# Patient Record
Sex: Male | Born: 1935 | Race: White | Hispanic: Yes | Smoking: Former smoker
Health system: Southern US, Community
[De-identification: ages and names within clinical notes are randomized; demographics above are authoritative.]

## PROBLEM LIST (undated history)

## (undated) DIAGNOSIS — I341 Nonrheumatic mitral (valve) prolapse: Secondary | ICD-10-CM

## (undated) DIAGNOSIS — I071 Rheumatic tricuspid insufficiency: Secondary | ICD-10-CM

## (undated) DIAGNOSIS — M7120 Synovial cyst of popliteal space [Baker], unspecified knee: Secondary | ICD-10-CM

## (undated) DIAGNOSIS — IMO0001 Reserved for inherently not codable concepts without codable children: Secondary | ICD-10-CM

## (undated) DIAGNOSIS — I4891 Unspecified atrial fibrillation: Secondary | ICD-10-CM

## (undated) DIAGNOSIS — L309 Dermatitis, unspecified: Secondary | ICD-10-CM

## (undated) DIAGNOSIS — I350 Nonrheumatic aortic (valve) stenosis: Secondary | ICD-10-CM

## (undated) DIAGNOSIS — M1990 Unspecified osteoarthritis, unspecified site: Secondary | ICD-10-CM

## (undated) DIAGNOSIS — N529 Male erectile dysfunction, unspecified: Secondary | ICD-10-CM

## (undated) DIAGNOSIS — I340 Nonrheumatic mitral (valve) insufficiency: Secondary | ICD-10-CM

## (undated) DIAGNOSIS — I499 Cardiac arrhythmia, unspecified: Secondary | ICD-10-CM

## (undated) DIAGNOSIS — Z8719 Personal history of other diseases of the digestive system: Secondary | ICD-10-CM

## (undated) DIAGNOSIS — Z923 Personal history of irradiation: Secondary | ICD-10-CM

## (undated) HISTORY — DX: Nonrheumatic aortic (valve) stenosis: I35.0

## (undated) HISTORY — DX: Synovial cyst of popliteal space (Baker), unspecified knee: M71.20

## (undated) HISTORY — DX: Nonrheumatic mitral (valve) prolapse: I34.1

## (undated) HISTORY — PX: AORTIC VALVE REPLACEMENT: SHX41

## (undated) HISTORY — DX: Unspecified atrial fibrillation: I48.91

## (undated) HISTORY — DX: Male erectile dysfunction, unspecified: N52.9

## (undated) HISTORY — DX: Essential (primary) hypertension: I10

## (undated) HISTORY — PX: ROTATOR CUFF REPAIR: SHX139

## (undated) HISTORY — DX: Unspecified osteoarthritis, unspecified site: M19.90

## (undated) HISTORY — PX: HERNIA REPAIR: SHX51

---

## 1992-08-28 HISTORY — PX: OTHER SURGICAL HISTORY: SHX169

## 2001-08-07 ENCOUNTER — Encounter: Admission: RE | Admit: 2001-08-07 | Discharge: 2001-08-07 | Payer: Self-pay | Admitting: Orthopedic Surgery

## 2001-08-07 ENCOUNTER — Encounter: Payer: Self-pay | Admitting: Orthopedic Surgery

## 2002-06-13 ENCOUNTER — Encounter: Admission: RE | Admit: 2002-06-13 | Discharge: 2002-06-13 | Payer: Self-pay | Admitting: Family Medicine

## 2002-06-13 ENCOUNTER — Encounter: Payer: Self-pay | Admitting: Family Medicine

## 2003-09-23 ENCOUNTER — Encounter: Payer: Self-pay | Admitting: Gastroenterology

## 2004-08-28 DIAGNOSIS — Z8679 Personal history of other diseases of the circulatory system: Secondary | ICD-10-CM

## 2004-08-28 HISTORY — DX: Personal history of other diseases of the circulatory system: Z86.79

## 2004-10-17 ENCOUNTER — Ambulatory Visit: Payer: Self-pay | Admitting: Family Medicine

## 2004-10-17 ENCOUNTER — Encounter: Admission: RE | Admit: 2004-10-17 | Discharge: 2004-10-17 | Payer: Self-pay | Admitting: Family Medicine

## 2004-11-29 ENCOUNTER — Ambulatory Visit: Payer: Self-pay | Admitting: Family Medicine

## 2004-12-06 ENCOUNTER — Ambulatory Visit: Payer: Self-pay | Admitting: Family Medicine

## 2004-12-16 ENCOUNTER — Ambulatory Visit: Payer: Self-pay

## 2005-01-24 ENCOUNTER — Ambulatory Visit: Payer: Self-pay | Admitting: Cardiology

## 2005-01-26 ENCOUNTER — Ambulatory Visit: Payer: Self-pay | Admitting: Internal Medicine

## 2005-01-30 ENCOUNTER — Ambulatory Visit: Payer: Self-pay

## 2005-03-07 ENCOUNTER — Ambulatory Visit: Payer: Self-pay

## 2005-03-07 ENCOUNTER — Ambulatory Visit (HOSPITAL_COMMUNITY): Admission: RE | Admit: 2005-03-07 | Discharge: 2005-03-07 | Payer: Self-pay | Admitting: Cardiology

## 2005-03-10 ENCOUNTER — Inpatient Hospital Stay (HOSPITAL_BASED_OUTPATIENT_CLINIC_OR_DEPARTMENT_OTHER): Admission: RE | Admit: 2005-03-10 | Discharge: 2005-03-10 | Payer: Self-pay | Admitting: Cardiology

## 2005-03-10 ENCOUNTER — Ambulatory Visit: Payer: Self-pay | Admitting: Cardiology

## 2005-03-17 ENCOUNTER — Ambulatory Visit (HOSPITAL_COMMUNITY)
Admission: RE | Admit: 2005-03-17 | Discharge: 2005-03-17 | Payer: Self-pay | Admitting: Thoracic Surgery (Cardiothoracic Vascular Surgery)

## 2005-03-21 ENCOUNTER — Inpatient Hospital Stay (HOSPITAL_COMMUNITY)
Admission: RE | Admit: 2005-03-21 | Discharge: 2005-03-27 | Payer: Self-pay | Admitting: Thoracic Surgery (Cardiothoracic Vascular Surgery)

## 2005-03-21 ENCOUNTER — Encounter (INDEPENDENT_AMBULATORY_CARE_PROVIDER_SITE_OTHER): Payer: Self-pay | Admitting: *Deleted

## 2005-03-29 ENCOUNTER — Ambulatory Visit: Payer: Self-pay | Admitting: *Deleted

## 2005-04-04 ENCOUNTER — Ambulatory Visit: Payer: Self-pay | Admitting: Cardiology

## 2005-04-11 ENCOUNTER — Ambulatory Visit: Payer: Self-pay | Admitting: Cardiology

## 2005-04-17 ENCOUNTER — Ambulatory Visit: Payer: Self-pay | Admitting: Cardiology

## 2005-05-03 ENCOUNTER — Ambulatory Visit: Payer: Self-pay

## 2005-05-03 ENCOUNTER — Ambulatory Visit: Payer: Self-pay | Admitting: Cardiology

## 2005-05-10 ENCOUNTER — Ambulatory Visit: Payer: Self-pay | Admitting: Family Medicine

## 2005-05-17 ENCOUNTER — Ambulatory Visit: Payer: Self-pay | Admitting: Cardiology

## 2005-05-31 ENCOUNTER — Ambulatory Visit: Payer: Self-pay | Admitting: Cardiology

## 2005-06-07 ENCOUNTER — Ambulatory Visit: Payer: Self-pay | Admitting: Cardiology

## 2005-06-19 ENCOUNTER — Encounter
Admission: RE | Admit: 2005-06-19 | Discharge: 2005-06-19 | Payer: Self-pay | Admitting: Thoracic Surgery (Cardiothoracic Vascular Surgery)

## 2005-11-28 ENCOUNTER — Ambulatory Visit: Payer: Self-pay | Admitting: Cardiology

## 2006-01-05 ENCOUNTER — Ambulatory Visit: Payer: Self-pay | Admitting: Family Medicine

## 2006-01-15 ENCOUNTER — Ambulatory Visit: Payer: Self-pay | Admitting: Family Medicine

## 2006-02-06 ENCOUNTER — Ambulatory Visit: Payer: Self-pay | Admitting: Family Medicine

## 2006-03-08 ENCOUNTER — Ambulatory Visit: Payer: Self-pay | Admitting: Family Medicine

## 2006-06-22 ENCOUNTER — Ambulatory Visit: Payer: Self-pay | Admitting: Cardiology

## 2006-06-25 ENCOUNTER — Ambulatory Visit: Payer: Self-pay

## 2006-06-25 ENCOUNTER — Encounter: Payer: Self-pay | Admitting: Cardiology

## 2007-01-11 ENCOUNTER — Ambulatory Visit: Payer: Self-pay | Admitting: Family Medicine

## 2007-01-11 LAB — CONVERTED CEMR LAB
ALT: 10 units/L (ref 0–40)
AST: 16 units/L (ref 0–37)
Albumin: 4.2 g/dL (ref 3.5–5.2)
Alkaline Phosphatase: 67 units/L (ref 39–117)
BUN: 9 mg/dL (ref 6–23)
Basophils Absolute: 0 10*3/uL (ref 0.0–0.1)
Basophils Relative: 0.2 % (ref 0.0–1.0)
Bilirubin, Direct: 0.1 mg/dL (ref 0.0–0.3)
CO2: 33 meq/L — ABNORMAL HIGH (ref 19–32)
Calcium: 9.5 mg/dL (ref 8.4–10.5)
Chloride: 101 meq/L (ref 96–112)
Cholesterol: 191 mg/dL (ref 0–200)
Creatinine, Ser: 0.8 mg/dL (ref 0.4–1.5)
Eosinophils Absolute: 0.2 10*3/uL (ref 0.0–0.6)
Eosinophils Relative: 3.1 % (ref 0.0–5.0)
GFR calc Af Amer: 123 mL/min
GFR calc non Af Amer: 102 mL/min
Glucose, Bld: 99 mg/dL (ref 70–99)
HCT: 42 % (ref 39.0–52.0)
HDL: 35.2 mg/dL — ABNORMAL LOW (ref >39.0)
Hemoglobin: 14.3 g/dL (ref 13.0–17.0)
LDL Cholesterol: 124 mg/dL — ABNORMAL HIGH (ref 0–99)
Lymphocytes Relative: 26.8 % (ref 12.0–46.0)
MCHC: 34.1 g/dL (ref 30.0–36.0)
MCV: 95.4 fL (ref 78.0–100.0)
Monocytes Absolute: 0.5 10*3/uL (ref 0.2–0.7)
Monocytes Relative: 10.3 % (ref 3.0–11.0)
Neutro Abs: 2.9 10*3/uL (ref 1.4–7.7)
Neutrophils Relative %: 59.6 % (ref 43.0–77.0)
PSA: 1.85 ng/mL (ref 0.10–4.00)
Platelets: 206 10*3/uL (ref 150–400)
Potassium: 3.5 meq/L (ref 3.5–5.1)
RBC: 4.4 M/uL (ref 4.22–5.81)
RDW: 13 % (ref 11.5–14.6)
Sodium: 139 meq/L (ref 135–145)
TSH: 0.44 microintl units/mL (ref 0.35–5.50)
Total Bilirubin: 0.6 mg/dL (ref 0.3–1.2)
Total CHOL/HDL Ratio: 5.4
Total Protein: 7.3 g/dL (ref 6.0–8.3)
Triglycerides: 158 mg/dL — ABNORMAL HIGH (ref 0–149)
VLDL: 32 mg/dL (ref 0–40)
WBC: 4.9 10*3/uL (ref 4.5–10.5)

## 2007-01-18 ENCOUNTER — Ambulatory Visit: Payer: Self-pay | Admitting: Family Medicine

## 2007-02-05 ENCOUNTER — Encounter: Payer: Self-pay | Admitting: Family Medicine

## 2007-02-05 ENCOUNTER — Ambulatory Visit: Payer: Self-pay

## 2007-04-17 DIAGNOSIS — M109 Gout, unspecified: Secondary | ICD-10-CM | POA: Insufficient documentation

## 2007-04-17 DIAGNOSIS — I10 Essential (primary) hypertension: Secondary | ICD-10-CM | POA: Insufficient documentation

## 2007-04-17 DIAGNOSIS — K219 Gastro-esophageal reflux disease without esophagitis: Secondary | ICD-10-CM | POA: Insufficient documentation

## 2007-05-16 ENCOUNTER — Ambulatory Visit: Payer: Self-pay | Admitting: Family Medicine

## 2007-05-16 DIAGNOSIS — H8110 Benign paroxysmal vertigo, unspecified ear: Secondary | ICD-10-CM | POA: Insufficient documentation

## 2007-05-31 ENCOUNTER — Ambulatory Visit: Payer: Self-pay | Admitting: Cardiology

## 2007-10-21 ENCOUNTER — Telehealth: Payer: Self-pay | Admitting: Family Medicine

## 2008-01-13 ENCOUNTER — Telehealth: Payer: Self-pay | Admitting: Family Medicine

## 2008-01-14 ENCOUNTER — Ambulatory Visit: Payer: Self-pay | Admitting: Family Medicine

## 2008-01-14 LAB — CONVERTED CEMR LAB
ALT: 13 units/L (ref 0–53)
AST: 19 units/L (ref 0–37)
Albumin: 4.2 g/dL (ref 3.5–5.2)
Alkaline Phosphatase: 59 units/L (ref 39–117)
BUN: 14 mg/dL (ref 6–23)
Basophils Absolute: 0 10*3/uL (ref 0.0–0.1)
Basophils Relative: 0.3 % (ref 0.0–1.0)
Bilirubin Urine: NEGATIVE
Bilirubin, Direct: 0.1 mg/dL (ref 0.0–0.3)
Blood in Urine, dipstick: NEGATIVE
CO2: 34 meq/L — ABNORMAL HIGH (ref 19–32)
Calcium: 9.5 mg/dL (ref 8.4–10.5)
Chloride: 103 meq/L (ref 96–112)
Cholesterol: 149 mg/dL (ref 0–200)
Creatinine, Ser: 0.9 mg/dL (ref 0.4–1.5)
Eosinophils Absolute: 0.2 10*3/uL (ref 0.0–0.7)
Eosinophils Relative: 2.7 % (ref 0.0–5.0)
GFR calc Af Amer: 107 mL/min
GFR calc non Af Amer: 89 mL/min
Glucose, Bld: 100 mg/dL — ABNORMAL HIGH (ref 70–99)
Glucose, Urine, Semiquant: NEGATIVE
HCT: 40.7 % (ref 39.0–52.0)
HDL: 28.1 mg/dL — ABNORMAL LOW (ref >39.0)
Hemoglobin: 13.9 g/dL (ref 13.0–17.0)
Ketones, urine, test strip: NEGATIVE
LDL Cholesterol: 100 mg/dL — ABNORMAL HIGH (ref 0–99)
Lymphocytes Relative: 17.2 % (ref 12.0–46.0)
MCHC: 34.1 g/dL (ref 30.0–36.0)
MCV: 97.7 fL (ref 78.0–100.0)
Monocytes Absolute: 0.6 10*3/uL (ref 0.1–1.0)
Monocytes Relative: 10.1 % (ref 3.0–12.0)
Neutro Abs: 3.8 10*3/uL (ref 1.4–7.7)
Neutrophils Relative %: 69.7 % (ref 43.0–77.0)
Nitrite: NEGATIVE
PSA: 2.23 ng/mL (ref 0.10–4.00)
Platelets: 188 10*3/uL (ref 150–400)
Potassium: 3.9 meq/L (ref 3.5–5.1)
Protein, U semiquant: NEGATIVE
RBC: 4.17 M/uL — ABNORMAL LOW (ref 4.22–5.81)
RDW: 13.3 % (ref 11.5–14.6)
Sodium: 142 meq/L (ref 135–145)
Specific Gravity, Urine: <1.005
TSH: 0.35 microintl units/mL (ref 0.35–5.50)
Total Bilirubin: 0.8 mg/dL (ref 0.3–1.2)
Total CHOL/HDL Ratio: 5.3
Total Protein: 7.3 g/dL (ref 6.0–8.3)
Triglycerides: 107 mg/dL (ref 0–149)
Urobilinogen, UA: 0.2
VLDL: 21 mg/dL (ref 0–40)
WBC Urine, dipstick: NEGATIVE
WBC: 5.6 10*3/uL (ref 4.5–10.5)
pH: 6.5

## 2008-01-21 ENCOUNTER — Ambulatory Visit: Payer: Self-pay | Admitting: Family Medicine

## 2008-01-21 DIAGNOSIS — N529 Male erectile dysfunction, unspecified: Secondary | ICD-10-CM | POA: Insufficient documentation

## 2008-01-21 DIAGNOSIS — I359 Nonrheumatic aortic valve disorder, unspecified: Secondary | ICD-10-CM | POA: Insufficient documentation

## 2008-01-23 ENCOUNTER — Encounter: Payer: Self-pay | Admitting: *Deleted

## 2008-05-28 ENCOUNTER — Ambulatory Visit: Payer: Self-pay | Admitting: Cardiology

## 2008-05-28 LAB — CONVERTED CEMR LAB
BUN: 10 mg/dL (ref 6–23)
CO2: 33 meq/L — ABNORMAL HIGH (ref 19–32)
Calcium: 9.1 mg/dL (ref 8.4–10.5)
Chloride: 97 meq/L (ref 96–112)
Creatinine, Ser: 1 mg/dL (ref 0.4–1.5)
GFR calc Af Amer: 95 mL/min
GFR calc non Af Amer: 79 mL/min
Glucose, Bld: 86 mg/dL (ref 70–99)
Potassium: 4 meq/L (ref 3.5–5.1)
Sodium: 139 meq/L (ref 135–145)

## 2008-06-05 ENCOUNTER — Ambulatory Visit: Payer: Self-pay | Admitting: Internal Medicine

## 2008-06-05 ENCOUNTER — Encounter: Payer: Self-pay | Admitting: Cardiology

## 2008-06-05 ENCOUNTER — Ambulatory Visit: Payer: Self-pay

## 2008-08-01 ENCOUNTER — Inpatient Hospital Stay (HOSPITAL_COMMUNITY): Admission: AD | Admit: 2008-08-01 | Discharge: 2008-08-02 | Payer: Self-pay | Admitting: Orthopedic Surgery

## 2009-03-16 ENCOUNTER — Ambulatory Visit: Payer: Self-pay | Admitting: Family Medicine

## 2009-03-16 ENCOUNTER — Telehealth: Payer: Self-pay | Admitting: Family Medicine

## 2009-03-16 DIAGNOSIS — I4891 Unspecified atrial fibrillation: Secondary | ICD-10-CM | POA: Insufficient documentation

## 2009-03-16 DIAGNOSIS — I482 Chronic atrial fibrillation, unspecified: Secondary | ICD-10-CM | POA: Insufficient documentation

## 2009-03-18 LAB — CONVERTED CEMR LAB
ALT: 18 units/L (ref 0–53)
AST: 23 units/L (ref 0–37)
Albumin: 4.1 g/dL (ref 3.5–5.2)
Alkaline Phosphatase: 70 units/L (ref 39–117)
BUN: 15 mg/dL (ref 6–23)
Basophils Absolute: 0 10*3/uL (ref 0.0–0.1)
Basophils Relative: 0.1 % (ref 0.0–3.0)
Bilirubin, Direct: 0.1 mg/dL (ref 0.0–0.3)
CO2: 33 meq/L — ABNORMAL HIGH (ref 19–32)
Calcium: 9.2 mg/dL (ref 8.4–10.5)
Chloride: 100 meq/L (ref 96–112)
Creatinine, Ser: 0.9 mg/dL (ref 0.4–1.5)
Eosinophils Absolute: 0.1 10*3/uL (ref 0.0–0.7)
Eosinophils Relative: 1.5 % (ref 0.0–5.0)
GFR calc non Af Amer: 88.34 mL/min (ref >60)
Glucose, Bld: 106 mg/dL — ABNORMAL HIGH (ref 70–99)
HCT: 41.1 % (ref 39.0–52.0)
Hemoglobin: 14 g/dL (ref 13.0–17.0)
INR: 0.9 (ref 0.8–1.0)
Lymphocytes Relative: 18.3 % (ref 12.0–46.0)
Lymphs Abs: 1 10*3/uL (ref 0.7–4.0)
MCHC: 34.1 g/dL (ref 30.0–36.0)
MCV: 97.9 fL (ref 78.0–100.0)
Monocytes Absolute: 0.5 10*3/uL (ref 0.1–1.0)
Monocytes Relative: 9.1 % (ref 3.0–12.0)
Neutro Abs: 3.6 10*3/uL (ref 1.4–7.7)
Neutrophils Relative %: 71 % (ref 43.0–77.0)
PSA: 1.85 ng/mL (ref 0.10–4.00)
Platelets: 183 10*3/uL (ref 150.0–400.0)
Potassium: 4.3 meq/L (ref 3.5–5.1)
Prothrombin Time: 10.1 s — ABNORMAL LOW (ref 10.9–13.3)
RBC: 4.2 M/uL — ABNORMAL LOW (ref 4.22–5.81)
RDW: 12.6 % (ref 11.5–14.6)
Sodium: 138 meq/L (ref 135–145)
TSH: 0.36 microintl units/mL (ref 0.35–5.50)
Total Bilirubin: 0.9 mg/dL (ref 0.3–1.2)
Total Protein: 6.8 g/dL (ref 6.0–8.3)
Uric Acid, Serum: 5.2 mg/dL (ref 4.0–7.8)
WBC: 5.2 10*3/uL (ref 4.5–10.5)

## 2009-03-22 DIAGNOSIS — M1990 Unspecified osteoarthritis, unspecified site: Secondary | ICD-10-CM | POA: Insufficient documentation

## 2009-03-23 ENCOUNTER — Ambulatory Visit: Payer: Self-pay | Admitting: Family Medicine

## 2009-03-23 ENCOUNTER — Ambulatory Visit: Payer: Self-pay | Admitting: Cardiology

## 2009-03-23 DIAGNOSIS — R0989 Other specified symptoms and signs involving the circulatory and respiratory systems: Secondary | ICD-10-CM | POA: Insufficient documentation

## 2009-03-26 ENCOUNTER — Ambulatory Visit: Payer: Self-pay | Admitting: Cardiology

## 2009-03-26 LAB — CONVERTED CEMR LAB
POC INR: 1.3
Prothrombin Time: 14.4 s

## 2009-03-30 ENCOUNTER — Ambulatory Visit: Payer: Self-pay | Admitting: Cardiology

## 2009-03-30 LAB — CONVERTED CEMR LAB
POC INR: 3.7
Prothrombin Time: 23.2 s

## 2009-04-02 ENCOUNTER — Encounter: Payer: Self-pay | Admitting: Cardiology

## 2009-04-02 ENCOUNTER — Ambulatory Visit: Payer: Self-pay

## 2009-04-02 ENCOUNTER — Encounter: Payer: Self-pay | Admitting: Cardiovascular Disease

## 2009-04-06 ENCOUNTER — Ambulatory Visit: Payer: Self-pay | Admitting: Internal Medicine

## 2009-04-06 ENCOUNTER — Encounter (INDEPENDENT_AMBULATORY_CARE_PROVIDER_SITE_OTHER): Payer: Self-pay | Admitting: *Deleted

## 2009-04-06 LAB — CONVERTED CEMR LAB
POC INR: 3.9
Prothrombin Time: 23.8 s

## 2009-04-20 ENCOUNTER — Ambulatory Visit: Payer: Self-pay | Admitting: Internal Medicine

## 2009-04-20 LAB — CONVERTED CEMR LAB: POC INR: 2.9

## 2009-05-11 ENCOUNTER — Ambulatory Visit: Payer: Self-pay | Admitting: Cardiology

## 2009-05-11 LAB — CONVERTED CEMR LAB: POC INR: 3.1

## 2009-05-20 ENCOUNTER — Ambulatory Visit: Payer: Self-pay | Admitting: Cardiology

## 2009-05-20 ENCOUNTER — Encounter (INDEPENDENT_AMBULATORY_CARE_PROVIDER_SITE_OTHER): Payer: Self-pay | Admitting: *Deleted

## 2009-05-20 LAB — CONVERTED CEMR LAB
INR: 2.1
POC INR: 2.1

## 2009-05-21 LAB — CONVERTED CEMR LAB
BUN: 16 mg/dL (ref 6–23)
Basophils Absolute: 0 10*3/uL (ref 0.0–0.1)
Basophils Relative: 0.4 % (ref 0.0–3.0)
CO2: 33 meq/L — ABNORMAL HIGH (ref 19–32)
Calcium: 9.3 mg/dL (ref 8.4–10.5)
Chloride: 101 meq/L (ref 96–112)
Creatinine, Ser: 1.1 mg/dL (ref 0.4–1.5)
Eosinophils Absolute: 0.1 10*3/uL (ref 0.0–0.7)
Eosinophils Relative: 2.4 % (ref 0.0–5.0)
GFR calc non Af Amer: 70.04 mL/min (ref >60)
Glucose, Bld: 92 mg/dL (ref 70–99)
HCT: 40 % (ref 39.0–52.0)
Hemoglobin: 13.9 g/dL (ref 13.0–17.0)
Lymphocytes Relative: 18.9 % (ref 12.0–46.0)
Lymphs Abs: 1 10*3/uL (ref 0.7–4.0)
MCHC: 34.6 g/dL (ref 30.0–36.0)
MCV: 97 fL (ref 78.0–100.0)
Monocytes Absolute: 0.6 10*3/uL (ref 0.1–1.0)
Monocytes Relative: 11.6 % (ref 3.0–12.0)
Neutro Abs: 3.8 10*3/uL (ref 1.4–7.7)
Neutrophils Relative %: 66.7 % (ref 43.0–77.0)
Platelets: 177 10*3/uL (ref 150.0–400.0)
Potassium: 3.8 meq/L (ref 3.5–5.1)
RBC: 4.12 M/uL — ABNORMAL LOW (ref 4.22–5.81)
RDW: 13.3 % (ref 11.5–14.6)
Sodium: 140 meq/L (ref 135–145)
WBC: 5.5 10*3/uL (ref 4.5–10.5)

## 2009-05-24 LAB — CONVERTED CEMR LAB: Magnesium: 2.1 mg/dL (ref 1.5–2.5)

## 2009-05-25 ENCOUNTER — Telehealth (INDEPENDENT_AMBULATORY_CARE_PROVIDER_SITE_OTHER): Payer: Self-pay | Admitting: Physician Assistant

## 2009-05-25 ENCOUNTER — Encounter: Payer: Self-pay | Admitting: Cardiology

## 2009-05-25 ENCOUNTER — Ambulatory Visit: Admission: RE | Admit: 2009-05-25 | Discharge: 2009-05-25 | Payer: Self-pay | Admitting: Cardiology

## 2009-06-01 ENCOUNTER — Ambulatory Visit: Payer: Self-pay | Admitting: Cardiology

## 2009-06-01 LAB — CONVERTED CEMR LAB: POC INR: 2.1

## 2009-06-10 ENCOUNTER — Ambulatory Visit: Payer: Self-pay | Admitting: Cardiology

## 2009-06-10 LAB — CONVERTED CEMR LAB: POC INR: 2.5

## 2009-06-17 ENCOUNTER — Ambulatory Visit: Payer: Self-pay | Admitting: Cardiovascular Disease

## 2009-06-17 LAB — CONVERTED CEMR LAB: POC INR: 2.8

## 2009-06-24 ENCOUNTER — Ambulatory Visit: Payer: Self-pay | Admitting: Cardiology

## 2009-06-24 LAB — CONVERTED CEMR LAB: POC INR: 2.5

## 2009-07-01 ENCOUNTER — Ambulatory Visit: Payer: Self-pay | Admitting: Internal Medicine

## 2009-07-01 LAB — CONVERTED CEMR LAB: POC INR: 2.4

## 2009-07-07 ENCOUNTER — Ambulatory Visit: Payer: Self-pay | Admitting: Cardiology

## 2009-07-07 ENCOUNTER — Encounter (INDEPENDENT_AMBULATORY_CARE_PROVIDER_SITE_OTHER): Payer: Self-pay | Admitting: *Deleted

## 2009-07-07 ENCOUNTER — Ambulatory Visit: Payer: Self-pay | Admitting: Internal Medicine

## 2009-07-07 LAB — CONVERTED CEMR LAB: POC INR: 2

## 2009-07-09 ENCOUNTER — Ambulatory Visit: Payer: Self-pay | Admitting: Internal Medicine

## 2009-07-09 ENCOUNTER — Ambulatory Visit (HOSPITAL_COMMUNITY): Admission: RE | Admit: 2009-07-09 | Discharge: 2009-07-09 | Payer: Self-pay | Admitting: Internal Medicine

## 2009-07-15 ENCOUNTER — Ambulatory Visit: Payer: Self-pay | Admitting: Cardiology

## 2009-07-15 LAB — CONVERTED CEMR LAB: POC INR: 2.9

## 2009-08-05 ENCOUNTER — Ambulatory Visit: Payer: Self-pay | Admitting: Cardiovascular Disease

## 2009-08-05 LAB — CONVERTED CEMR LAB: POC INR: 2.7

## 2009-09-16 ENCOUNTER — Telehealth: Payer: Self-pay | Admitting: Family Medicine

## 2009-10-06 ENCOUNTER — Ambulatory Visit: Payer: Self-pay | Admitting: Cardiology

## 2009-10-20 ENCOUNTER — Encounter: Payer: Self-pay | Admitting: Cardiology

## 2009-12-29 ENCOUNTER — Encounter (INDEPENDENT_AMBULATORY_CARE_PROVIDER_SITE_OTHER): Payer: Self-pay | Admitting: *Deleted

## 2010-01-25 ENCOUNTER — Encounter (INDEPENDENT_AMBULATORY_CARE_PROVIDER_SITE_OTHER): Payer: Self-pay | Admitting: *Deleted

## 2010-01-25 ENCOUNTER — Ambulatory Visit: Payer: Self-pay | Admitting: Gastroenterology

## 2010-01-27 ENCOUNTER — Ambulatory Visit: Payer: Self-pay | Admitting: Gastroenterology

## 2010-01-27 LAB — CONVERTED CEMR LAB: Fecal Occult Bld: NEGATIVE

## 2010-03-24 ENCOUNTER — Ambulatory Visit: Payer: Self-pay | Admitting: Family Medicine

## 2010-03-24 LAB — CONVERTED CEMR LAB
ALT: 15 units/L (ref 0–53)
AST: 15 units/L (ref 0–37)
Albumin: 4.1 g/dL (ref 3.5–5.2)
Alkaline Phosphatase: 62 units/L (ref 39–117)
BUN: 15 mg/dL (ref 6–23)
Basophils Absolute: 0 10*3/uL (ref 0.0–0.1)
Basophils Relative: 0.6 % (ref 0.0–3.0)
Bilirubin Urine: NEGATIVE
Bilirubin, Direct: 0.1 mg/dL (ref 0.0–0.3)
Blood in Urine, dipstick: NEGATIVE
CO2: 33 meq/L — ABNORMAL HIGH (ref 19–32)
Calcium: 8.6 mg/dL (ref 8.4–10.5)
Chloride: 97 meq/L (ref 96–112)
Cholesterol: 173 mg/dL (ref 0–200)
Creatinine, Ser: 1 mg/dL (ref 0.4–1.5)
Eosinophils Absolute: 0.2 10*3/uL (ref 0.0–0.7)
Eosinophils Relative: 3.6 % (ref 0.0–5.0)
GFR calc non Af Amer: 81.76 mL/min (ref >60)
Glucose, Bld: 104 mg/dL — ABNORMAL HIGH (ref 70–99)
Glucose, Urine, Semiquant: NEGATIVE
HCT: 40 % (ref 39.0–52.0)
HDL: 35.6 mg/dL — ABNORMAL LOW (ref >39.00)
Hemoglobin: 13.8 g/dL (ref 13.0–17.0)
Ketones, urine, test strip: NEGATIVE
LDL Cholesterol: 113 mg/dL — ABNORMAL HIGH (ref 0–99)
Lymphocytes Relative: 20.3 % (ref 12.0–46.0)
Lymphs Abs: 1.2 10*3/uL (ref 0.7–4.0)
MCHC: 34.4 g/dL (ref 30.0–36.0)
MCV: 98.3 fL (ref 78.0–100.0)
Monocytes Absolute: 0.5 10*3/uL (ref 0.1–1.0)
Monocytes Relative: 8.6 % (ref 3.0–12.0)
Neutro Abs: 3.8 10*3/uL (ref 1.4–7.7)
Neutrophils Relative %: 66.9 % (ref 43.0–77.0)
Nitrite: NEGATIVE
PSA: 2.42 ng/mL (ref 0.10–4.00)
Platelets: 157 10*3/uL (ref 150.0–400.0)
Potassium: 3.6 meq/L (ref 3.5–5.1)
RBC: 4.07 M/uL — ABNORMAL LOW (ref 4.22–5.81)
RDW: 14.6 % (ref 11.5–14.6)
Sodium: 141 meq/L (ref 135–145)
Specific Gravity, Urine: 1.02
TSH: 0.38 microintl units/mL (ref 0.35–5.50)
Total Bilirubin: 0.9 mg/dL (ref 0.3–1.2)
Total CHOL/HDL Ratio: 5
Total Protein: 6.6 g/dL (ref 6.0–8.3)
Triglycerides: 122 mg/dL (ref 0.0–149.0)
Urobilinogen, UA: 0.2
VLDL: 24.4 mg/dL (ref 0.0–40.0)
WBC Urine, dipstick: NEGATIVE
WBC: 5.7 10*3/uL (ref 4.5–10.5)
pH: 7.5

## 2010-03-31 ENCOUNTER — Ambulatory Visit: Payer: Self-pay | Admitting: Family Medicine

## 2010-04-01 ENCOUNTER — Ambulatory Visit: Payer: Self-pay | Admitting: Cardiology

## 2010-04-05 ENCOUNTER — Telehealth: Payer: Self-pay | Admitting: Cardiology

## 2010-04-15 ENCOUNTER — Ambulatory Visit: Payer: Self-pay

## 2010-04-15 ENCOUNTER — Ambulatory Visit (HOSPITAL_COMMUNITY): Admission: RE | Admit: 2010-04-15 | Discharge: 2010-04-15 | Payer: Self-pay | Admitting: Cardiology

## 2010-04-15 ENCOUNTER — Ambulatory Visit: Payer: Self-pay | Admitting: Cardiology

## 2010-04-15 ENCOUNTER — Encounter: Payer: Self-pay | Admitting: Cardiology

## 2010-04-19 ENCOUNTER — Telehealth (INDEPENDENT_AMBULATORY_CARE_PROVIDER_SITE_OTHER): Payer: Self-pay | Admitting: *Deleted

## 2010-04-19 LAB — CONVERTED CEMR LAB
BUN: 16 mg/dL (ref 6–23)
Basophils Absolute: 0 10*3/uL (ref 0.0–0.1)
Basophils Relative: 1 % (ref 0–1)
CO2: 30 meq/L (ref 19–32)
Calcium: 9.6 mg/dL (ref 8.4–10.5)
Chloride: 99 meq/L (ref 96–112)
Creatinine, Ser: 1.07 mg/dL (ref 0.40–1.50)
Eosinophils Absolute: 0.2 10*3/uL (ref 0.0–0.7)
Eosinophils Relative: 3 % (ref 0–5)
Glucose, Bld: 89 mg/dL (ref 70–99)
HCT: 38.5 % — ABNORMAL LOW (ref 39.0–52.0)
Hemoglobin: 13.3 g/dL (ref 13.0–17.0)
Lymphocytes Relative: 21 % (ref 12–46)
Lymphs Abs: 1.4 10*3/uL (ref 0.7–4.0)
MCHC: 34.5 g/dL (ref 30.0–36.0)
MCV: 93.7 fL (ref 78.0–100.0)
Monocytes Absolute: 0.6 10*3/uL (ref 0.1–1.0)
Monocytes Relative: 8 % (ref 3–12)
Neutro Abs: 4.7 10*3/uL (ref 1.7–7.7)
Neutrophils Relative %: 68 % (ref 43–77)
Platelets: 178 10*3/uL (ref 150–400)
Potassium: 4.1 meq/L (ref 3.5–5.3)
RBC: 4.11 M/uL — ABNORMAL LOW (ref 4.22–5.81)
RDW: 13 % (ref 11.5–15.5)
Sodium: 139 meq/L (ref 135–145)
TSH: 0.375 microintl units/mL (ref 0.350–4.500)
WBC: 6.9 10*3/uL (ref 4.0–10.5)

## 2010-04-25 ENCOUNTER — Telehealth: Payer: Self-pay | Admitting: Cardiology

## 2010-05-06 ENCOUNTER — Ambulatory Visit (HOSPITAL_BASED_OUTPATIENT_CLINIC_OR_DEPARTMENT_OTHER): Admission: RE | Admit: 2010-05-06 | Discharge: 2010-05-06 | Payer: Self-pay | Admitting: Orthopedic Surgery

## 2010-06-07 ENCOUNTER — Ambulatory Visit: Payer: Self-pay | Admitting: Cardiology

## 2010-06-08 LAB — CONVERTED CEMR LAB
BUN: 16 mg/dL (ref 6–23)
Basophils Absolute: 0 10*3/uL (ref 0.0–0.1)
Basophils Relative: 0.7 % (ref 0.0–3.0)
CO2: 33 meq/L — ABNORMAL HIGH (ref 19–32)
Calcium: 9.2 mg/dL (ref 8.4–10.5)
Chloride: 100 meq/L (ref 96–112)
Creatinine, Ser: 1 mg/dL (ref 0.4–1.5)
Eosinophils Absolute: 0.2 10*3/uL (ref 0.0–0.7)
Eosinophils Relative: 4 % (ref 0.0–5.0)
GFR calc non Af Amer: 79.8 mL/min (ref >60)
Glucose, Bld: 83 mg/dL (ref 70–99)
HCT: 39 % (ref 39.0–52.0)
Hemoglobin: 13.2 g/dL (ref 13.0–17.0)
Lymphocytes Relative: 20.1 % (ref 12.0–46.0)
Lymphs Abs: 1.1 10*3/uL (ref 0.7–4.0)
MCHC: 34 g/dL (ref 30.0–36.0)
MCV: 98.4 fL (ref 78.0–100.0)
Monocytes Absolute: 0.6 10*3/uL (ref 0.1–1.0)
Monocytes Relative: 10.3 % (ref 3.0–12.0)
Neutro Abs: 3.6 10*3/uL (ref 1.4–7.7)
Neutrophils Relative %: 64.9 % (ref 43.0–77.0)
Platelets: 169 10*3/uL (ref 150.0–400.0)
Potassium: 3.9 meq/L (ref 3.5–5.1)
RBC: 3.96 M/uL — ABNORMAL LOW (ref 4.22–5.81)
RDW: 13.8 % (ref 11.5–14.6)
Sodium: 137 meq/L (ref 135–145)
WBC: 5.5 10*3/uL (ref 4.5–10.5)

## 2010-09-18 ENCOUNTER — Encounter: Payer: Self-pay | Admitting: Thoracic Surgery (Cardiothoracic Vascular Surgery)

## 2010-09-27 NOTE — Medication Information (Signed)
Summary: rov/tm  Anticoagulant Therapy  Managed by: Bethena Midget, RN, BSN Referring MD: Dr Olga Millers Supervising MD: Graciela Husbands MD, Viviann Spare Indication 1: Atrial Fibrillation Lab Used: [facility] Site: [facility] PT 23.8 INR POC 3.9 INR RANGE 2.0-3.0  Dietary changes: no    Health status changes: no    Bleeding/hemorrhagic complications: no    Recent/future hospitalizations: no    Any changes in medication regimen? no    Recent/future dental: no  Any missed doses?: no       Is patient compliant with meds? yes       Allergies: No Known Drug Allergies  Anticoagulation Management History:      The patient is taking warfarin and comes in today for a routine follow up visit.  Positive risk factors for bleeding include an age of 26 years or older.  The bleeding index is 'intermediate risk'.  Positive CHADS2 values include History of HTN.  Negative CHADS2 values include Age > 9 years old.  His last INR was 0.9 ratio.  Prothrombin time is 23.8.  Anticoagulation responsible provider: Graciela Husbands MD, Viviann Spare.  INR POC: 3.9.  Cuvette Lot#: [id number].  Exp: 02/25/2010.    Anticoagulation Management Assessment/Plan:      The patient's current anticoagulation dose is Warfarin sodium 5 mg tabs: Use as directed by Anticoagulation Clinic.  The target INR is 2.0-3.0.  The next INR is due 04/20/2009.  Anticoagulation instructions were given to patient.  Results were reviewed/authorized by Bethena Midget, RN, BSN.  He was notified by Bethena Midget, RN, BSN.         Prior Anticoagulation Instructions: Skip today's dose then take 1 pill everyday except 1/2 pill on Fridays  Current Anticoagulation Instructions: Skip today. Then take 1 pill everyday except 1/2 pill on Mondays, Wednesdays and Fridays.

## 2010-09-27 NOTE — Progress Notes (Signed)
Summary: refills  Phone Note Refill Request Message from:  Fax from Pharmacy on September 16, 2009 12:34 PM  Refills Requested: Medication #1:  ZOLPIDEM TARTRATE 10 MG  TABS one by mouth q hs as needed  Medication #2:  DIAZEPAM 5 MG  TABS once daily Initial call taken by: Kern Reap CMA [name]),  September 16, 2009 12:35 PM    Prescriptions: DIAZEPAM 5 MG  TABS (DIAZEPAM) once daily  #60 x 6   Entered by:   Kern Reap CMA (AAMA)   Authorized by:   Roderick Pee MD   Signed by:   Kern Reap CMA (AAMA) on 09/16/2009   Method used:   Telephoned to ...       [facility]* (retail)       [address] [place], [place]  [address]       Ph: [phone]       Fax: [phone]   RxID:   [id number] ZOLPIDEM TARTRATE 10 MG  TABS (ZOLPIDEM TARTRATE) one by mouth q hs as needed  #60 x 6   Entered by:   Kern Reap CMA (AAMA)   Authorized by:   Roderick Pee MD   Signed by:   Kern Reap CMA (AAMA) on 09/16/2009   Method used:   Telephoned to ...       [facility]* (retail)       [address] [place], [place]  [address]       Ph: [phone]       Fax: [phone]   RxID:   [id number]

## 2010-09-27 NOTE — Letter (Signed)
Summary: Cardioversion/TEE Instructions  [facility] technologist, Main Office  [address]   [place], [place] [address]   Phone: [phone]  Fax: [phone]    Cardioversion Instructions  You are scheduled for a Cardioversion on FRIDAY 07-09-09 with Dr. Tenny Craw.   Please arrive at the [facility] at   8 a.m.  on the day of your procedure.  1)   DIET:  A)   Nothing to eat or drink after midnight except your medications with a sip of water.  B)   May have clear liquid breakfast, then nothing to eat or drink after _________ a.m. / p.m.      Clear liquids include:  water, broth, Sprite, Ginger Ale, black coffee, tea (no sugar),      cranberry / grape / apple juice, jello (not red), popsicle from clear juices (not red).  2)   Come to the [place] office on ____________________ for lab work. The lab at [facility] is open from 8:30 a.m. to 1:30 p.m. and 2:30 p.m. to 5:00 p.m. The lab at [facility] is open from 7:30 a.m. to 5:30 p.m. You do not have to be fasting.  3)   MAKE SURE YOU TAKE YOUR COUMADIN.  4)   A)   DO NOT TAKE these medications before your procedure:      DO NOT TAKE TENORETIC THE MORNING OF THE PROCEDURE     ___________________________________________________________________     ___________________________________________________________________  B)   YOU MAY TAKE ALL of your remaining medications with a small amount of water.    C)   START NEW medications:       ___________________________________________________________________     ___________________________________________________________________  5)  Must have a responsible person to drive you home.  6)   Bring a current list of your medications and current insurance cards.   * Special Note:  Every effort is made to have your procedure done on time. Occasionally there are emergencies that present themselves at the hospital that may cause delays.  Please be patient if a delay does occur.  * If you have any questions after you get home, please call the office at [phone].

## 2010-09-27 NOTE — Assessment & Plan Note (Signed)
Summary: per check out/sf  Medications Added ASPIRIN EC 325 MG TBEC (ASPIRIN) Take one tablet by mouth daily        CC:  sob.  History of Present Illness: [name] is a very pleasant  gentleman who has a history of aortic valve replacement secondary to aortic stenosis with a pericardial tissue valve and thoracic aortic aneurysm repair in July 2006.  Preoperative cardiac catheterization revealed normal coronary arteries. His last  CTA was  performed in October of 2009. There was no aortic aneurysm. He has recently been diagnosed with new-onset atrial fibrillation. A TSH was normal. An echocardiogram performed on April 02, 2009 which revealed normal LV function, mild aortic insufficiency and a mean gradient across his aortic valve of 11 mm of mercury. Also note we performed carotid Dopplers due to bruits which revealed 0-39% bilateral stenosis. I last saw him in November of 2010. Since then he underwent successful cardioversion in November of 2010.  He now denies dyspnea on exertion, orthopnea, PND, pedal edema, palpitations, syncope or chest pain. There is no bleeding.  Current Medications (verified): 1)  Allopurinol 300 Mg Tabs (Allopurinol) .... Once Daily 2)  Tenoretic 50 50-25 Mg Tabs (Atenolol-Chlorthalidone) .... 1/2 Tablet By Mouth Once A Day 3)  Viagra 100 Mg Tabs (Sildenafil Citrate) .... As Needed 4)  Zolpidem Tartrate 10 Mg  Tabs (Zolpidem Tartrate) .... One By Mouth Q Hs As Needed 5)  Diazepam 5 Mg  Tabs (Diazepam) .... Once Daily 6)  Triamcinolone Acetonide 0.1 %  Oint (Triamcinolone Acetonide) .... With Eucerine Cream--Apply Two Times A Day As Needed 1:1 Ratio 7)  Aleve 220 Mg  Tabs (Naproxen Sodium) .... Once Daily As Needed 8)  Omeprazole 10 Mg Cpdr (Omeprazole) .Marland Kitchen.. 1 Tab By Mouth Once Daily 9)  Warfarin Sodium 5 Mg Tabs (Warfarin Sodium) .... Use As Directed By Anticoagulation Clinic  Allergies: No Known Drug Allergies  Past History:  Past Medical History: Reviewed  history from 07/07/2009 and no changes required. Current Problems:  HYPERTENSION (ICD-401.9) FIBRILLATION, ATRIAL (ICD-427.31) AORTIC STENOSIS (ICD-424.1) ERECTILE DYSFUNCTION, ORGANIC (ICD-607.84) BENIGN POSITIONAL VERTIGO (ICD-386.11) GOUT (ICD-274.9) GERD (ICD-530.81) DEGENERATIVE JOINT DISEASE (ICD-715.90)  MVP Vocal CA Baker's cyst left knee ventral hernia  Past Surgical History: Reviewed history from 03/23/2009 and no changes required. Baker's cyst Torn Cartilage Inguinal herniorrhaphy aortic valve replacement secondary to aortic stenosis and thoracic  aortic aneurysm repair in July 2006. right and left hernia repair  Social History: Reviewed history from 04/17/2007 and no changes required. Retired Married Former Smoker Alcohol use-yes Drug use-no  Review of Systems       no fevers or chills, productive cough, hemoptysis, dysphasia, odynophagia, melena, hematochezia, dysuria, hematuria, rash, seizure activity, orthopnea, PND, pedal edema, claudication. Remaining systems are negative.   Vital Signs:  Patient profile:   75 year old male Height:      67 inches Weight:      173 pounds BMI:     27.19 Pulse rate:   58 / minute Resp:     12 per minute BP sitting:   149 / 66  (left arm)  Vitals Entered By: Kem Parkinson (October 06, 2009 10:33 AM)  Physical Exam  General:  Well-developed well-nourished in no acute distress.  Skin is warm and dry.  HEENT is normal.  Neck is supple. No thyromegaly.  Chest is clear to auscultation with normal expansion. Status post previous sternotomy. Cardiovascular exam is regular rate and rhythm. 2/6 systolic murmur left sternal border. No diastolic  murmur noted. Abdominal exam nontender or distended. No masses palpated. Extremities show no edema. neuro grossly intact    EKG  Procedure date:  10/06/2009  Findings:      Sinus bradycardia at a rate of 58. First degree AV block. RV conduction delay.  Impression &  Recommendations:  Problem # 1:  FIBRILLATION, ATRIAL (ICD-427.31) The patient remains in sinus rhythm status post cardioversion. He only has one embolic risk factor which is hypertension. I will discontinue his Coumadin and treat with aspirin 325 mg p.o. daily. Continue beta blocker. The following medications were removed from the medication list:    Warfarin Sodium 5 Mg Tabs (Warfarin sodium) ..... Use as directed by anticoagulation clinic His updated medication list for this problem includes:    Tenoretic 50 50-25 Mg Tabs (Atenolol-chlorthalidone) .Marland Kitchen... 1/2 tablet by mouth once a day    Aspirin Ec 325 Mg Tbec (Aspirin) .Marland Kitchen... Take one tablet by mouth daily  Problem # 2:  AORTIC STENOSIS (ICD-424.1) Status post aortic valve replacement. Continue SBE prophylaxis. His updated medication list for this problem includes:    Tenoretic 50 50-25 Mg Tabs (Atenolol-chlorthalidone) .Marland Kitchen... 1/2 tablet by mouth once a day  Problem # 3:  HYPERTENSION (ICD-401.9) Blood pressure mildly elevated. I will follow this and increase medications as needed. His updated medication list for this problem includes:    Tenoretic 50 50-25 Mg Tabs (Atenolol-chlorthalidone) .Marland Kitchen... 1/2 tablet by mouth once a day    Aspirin Ec 325 Mg Tbec (Aspirin) .Marland Kitchen... Take one tablet by mouth daily  Problem # 4:  GERD (ICD-530.81)  His updated medication list for this problem includes:    Omeprazole 10 Mg Cpdr (Omeprazole) .Marland Kitchen... 1 tab by mouth once daily  Problem # 5:  GOUT (ICD-274.9)  His updated medication list for this problem includes:    Allopurinol 300 Mg Tabs (Allopurinol) ..... Once daily  Patient Instructions: 1)  Your physician recommends that you schedule a follow-up appointment in: 6 MONTHS 2)  Your physician has recommended you make the following change in your medication: STOP WARFARIN 3)  START ASPIRIN 325MG  ONCE DAILY

## 2010-09-27 NOTE — Medication Information (Signed)
Summary: rov/sp  Anticoagulant Therapy  Managed by: Weston Brass, PharmD Referring MD: Dr Olga Millers Supervising MD: Myrtis Ser MD, Tinnie Gens Indication 1: Atrial Fibrillation Lab Used: [facility] Site: [address] INR POC 2.1 INR RANGE 2.0-3.0  Dietary changes: no    Health status changes: no    Bleeding/hemorrhagic complications: no    Recent/future hospitalizations: yes       Details: Had to reschedule cardioversion- was 1.9 at the hospital  Any changes in medication regimen? no    Recent/future dental: no  Any missed doses?: no       Is patient compliant with meds? yes      Comments: pending cardioversion  Allergies: No Known Drug Allergies  Anticoagulation Management History:      The patient is taking warfarin and comes in today for a routine follow up visit.  Positive risk factors for bleeding include an age of 73 years or older.  The bleeding index is 'intermediate risk'.  Positive CHADS2 values include History of HTN.  Negative CHADS2 values include Age > 73 years old.  His last INR was 2.1.  Anticoagulation responsible provider: Myrtis Ser MD, Tinnie Gens.  INR POC: 2.1.  Cuvette Lot#: [id number].  Exp: 06/2010.    Anticoagulation Management Assessment/Plan:      The patient's current anticoagulation dose is Warfarin sodium 5 mg tabs: Use as directed by Anticoagulation Clinic.  The target INR is 2.0-3.0.  The next INR is due 06/10/2009.  Anticoagulation instructions were given to patient.  Results were reviewed/authorized by Weston Brass, PharmD.  He was notified by Weston Brass PharmD.         Prior Anticoagulation Instructions: Spoke with [place], [place].    Take 1 1/2 tablets today then increase dose to 1 tablet everyday with 1/2 tablet on Monday, Wednesday, and Friday   Current Anticoagulation Instructions: INR 2.1  Increase dose to 1 tablet everyday except 1/2 on Monday and Friday

## 2010-09-27 NOTE — Progress Notes (Signed)
Summary: Pharm needs prior autherization   Phone Note Refill Request Message from:  Pharmacy on [employer] / [phone]  Refills Requested: Medication #1:  PRADAXA 150 MG CAPS take one tablet by mouth two times a day. pt needs prior autherization called into insurance the # is [phone]  Initial call taken by: Omer Jack,  April 05, 2010 9:58 AM  Follow-up for Phone Call        pradaxa approved for 1 year. Follow-up by: Laurance Flatten CMA,  April 05, 2010 10:40 AM

## 2010-09-27 NOTE — Medication Information (Signed)
Summary: NEW COUMADIN 5 MG STARTED 03-23-09 PENDING CARDIOVERION  Anticoagulant Therapy  Managed by: Shelby Dubin, PharmD, BCPS, CPP Referring MD: Dr Olga Millers Supervising MD: Daleen Squibb MD, Maisie Fus Indication 1: Atrial Fibrillation Lab Used: [facility] Site: [facility] PT 14.4 INR POC 1.3 INR RANGE 2.0-3.0  Dietary changes: no    Health status changes: no    Bleeding/hemorrhagic complications: no    Recent/future hospitalizations: no    Any changes in medication regimen? yes       Details: New start on coumadin.  Recent/future dental: no  Any missed doses?: no       Is patient compliant with meds? yes      Comments: Started on 03/23/09 on 5 mg tabs.  7.5mg  x 1 dose then 5mg  x 2 doses, non yet today.    Current Medications (verified): 1)  Allopurinol 300 Mg Tabs (Allopurinol) .... Once Daily 2)  Tenoretic 50 50-25 Mg Tabs (Atenolol-Chlorthalidone) .... 1/2 Tablet By Mouth Once A Day 3)  Viagra 100 Mg Tabs (Sildenafil Citrate) .... As Needed 4)  Zolpidem Tartrate 10 Mg  Tabs (Zolpidem Tartrate) .... One By Mouth Q Hs As Needed 5)  Diazepam 5 Mg  Tabs (Diazepam) .... Once Daily 6)  Triamcinolone Acetonide 0.1 %  Oint (Triamcinolone Acetonide) .... With Eucerine Cream--Apply Two Times A Day As Needed 1:1 Ratio 7)  Aleve 220 Mg  Tabs (Naproxen Sodium) .... Once Daily As Needed 8)  Omeprazole 10 Mg Cpdr (Omeprazole) .Marland Kitchen.. 1 Tab By Mouth Once Daily 9)  Warfarin Sodium 5 Mg Tabs (Warfarin Sodium) .... Use As Directed By Anticoagulation Clinic  Allergies (verified): No Known Drug Allergies  Anticoagulation Management History:      The patient comes in today for his initial visit for anticoagulation therapy.  Positive risk factors for bleeding include an age of 73 years or older.  The bleeding index is 'intermediate risk'.  Positive CHADS2 values include History of HTN.  Negative CHADS2 values include Age > 73 years old.  His last INR was 0.9 ratio.  Prothrombin time is 14.4.   Anticoagulation responsible provider: Daleen Squibb MD, Maisie Fus.  INR POC: 1.3.  Cuvette Lot#: [id number].  Exp: 02/25/2010.    Anticoagulation Management Assessment/Plan:      The patient's current anticoagulation dose is Warfarin sodium 5 mg tabs: Use as directed by Anticoagulation Clinic.  The target INR is 2.0-3.0.  The next INR is due 03/30/2009.  Results were reviewed/authorized by Shelby Dubin, PharmD, BCPS, CPP.  He was notified by Cloyde Reams RN.         Current Anticoagulation Instructions: INR 1.3  Take 1 and 1/2 tablets today then 1 tablet daily.  Recheck next Tuesday.    Appended Document: NEW COUMADIN 5 MG STARTED 03-23-09 PENDING CARDIOVERION  Reviewed Juanito Doom, MD

## 2010-09-27 NOTE — Medication Information (Signed)
Summary: rov/tm  Anticoagulant Therapy  Managed by: Bethena Midget, RN, BSN Referring MD: Dr Olga Millers Supervising MD: Juanda Chance MD, Vesna Kable Indication 1: Atrial Fibrillation Lab Used: [facility] Site: [address] INR POC 2.5 INR RANGE 2.0-3.0  Dietary changes: no    Health status changes: no    Bleeding/hemorrhagic complications: no    Recent/future hospitalizations: no    Any changes in medication regimen? no    Recent/future dental: no  Any missed doses?: no       Is patient compliant with meds? yes       Allergies: No Known Drug Allergies  Anticoagulation Management History:      The patient is taking warfarin and comes in today for a routine follow up visit.  Positive risk factors for bleeding include an age of 75 years or older.  The bleeding index is 'intermediate risk'.  Positive CHADS2 values include History of HTN.  Negative CHADS2 values include Age > 59 years old.  His last INR was 2.1.  Anticoagulation responsible provider: Juanda Chance MD, Smitty Cords.  INR POC: 2.5.  Cuvette Lot#: [id number].  Exp: 06/2010.    Anticoagulation Management Assessment/Plan:      The patient's current anticoagulation dose is Warfarin sodium 5 mg tabs: Use as directed by Anticoagulation Clinic.  The target INR is 2.0-3.0.  The next INR is due 06/17/2009.  Anticoagulation instructions were given to patient.  Results were reviewed/authorized by Bethena Midget, RN, BSN.  He was notified by Bethena Midget, RN, BSN.         Prior Anticoagulation Instructions: INR 2.1  Increase dose to 1 tablet everyday except 1/2 on Monday and Friday   Current Anticoagulation Instructions: INR 2.5 Continue 1 pill everyday except 1/2 pill on Mondays and Fridays. Recheck in one week.

## 2010-09-27 NOTE — Progress Notes (Signed)
  Medications Added AMLODIPINE BESYLATE 5 MG TABS (AMLODIPINE BESYLATE) Take one tablet by mouth daily       Phone Note Outgoing Call   Call placed by: Deliah Goody, RN,  April 19, 2010 1:14 PM Summary of Call: spoke with pt about the holter monitor reviewed by dr Jens Som. due to the occ pauses on the monitor the pt was instructed to d/c his tenoretic and start amlodipine 5mg  once daily. he will track his bp and let me know of any problems Deliah Goody, RN  April 19, 2010 1:16 PM     New/Updated Medications: AMLODIPINE BESYLATE 5 MG TABS (AMLODIPINE BESYLATE) Take one tablet by mouth daily Prescriptions: AMLODIPINE BESYLATE 5 MG TABS (AMLODIPINE BESYLATE) Take one tablet by mouth daily  #30 x 12   Entered by:   Deliah Goody, RN   Authorized by:   Ferman Hamming, MD, [facility]   Signed by:   Deliah Goody, RN on 04/19/2010   Method used:   Electronically to        [facility]* (retail)       [address] [place], [place]  [address]       Ph: [phone]       Fax: [phone]   RxID:   [id number]

## 2010-09-27 NOTE — Assessment & Plan Note (Signed)
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 I seen him in the past for chronic diarrhea, and he had a negative colonoscopy 5 years ago. He denies any current GI complaints whatsoever. He is having regular bowel movements without melena or hematochezia or abdominal pain. Is not on anticoagulant therapy but does take a daily aspirin. There is some question as to whether or not his vocal cord polyps are related to chronic GERD, and he continues on daily omeprazole  10 mg, aspirin 325 mg a day, and p.r.n. Aleve. He denies dysphagia, chest pain, indigestion, any hepatobiliary problems, anorexia or weight loss. Family history is noncontributory terms of colon polyps colon cancer. He gives no history of chronic anemia and hemoglobin was normal in November of 2010.   GI Review of Systems    Reports acid reflux.      Denies abdominal pain, belching, bloating, chest pain, dysphagia with liquids, dysphagia with solids, heartburn, loss of appetite, nausea, vomiting, vomiting blood, weight loss, and  weight gain.        Denies anal fissure, black tarry stools, change in bowel habit, constipation, diarrhea, diverticulosis, fecal incontinence, heme positive stool, hemorrhoids, irritable bowel syndrome, jaundice, light color stool, liver problems, rectal bleeding, and  rectal pain.    Current Medications (verified): 1)  Allopurinol 300 Mg Tabs (Allopurinol) .... Once Daily 2)  Tenoretic 50 50-25 Mg Tabs (Atenolol-Chlorthalidone) .... 1/2 Tablet By Mouth Once A Day 3)  Viagra 100 Mg Tabs (Sildenafil Citrate)  .... As Needed 4)  Zolpidem Tartrate 10 Mg  Tabs (Zolpidem Tartrate) .... One By Mouth Q Hs As Needed 5)  Diazepam 5 Mg  Tabs (Diazepam) .... Once Daily 6)  Triamcinolone Acetonide 0.1 %  Oint (Triamcinolone Acetonide) .... With Eucerine Cream--Apply Two Times A Day As Needed 1:1 Ratio 7)  Aleve 220 Mg  Tabs (Naproxen Sodium) .... Once Daily As Needed 8)  Omeprazole 10 Mg Cpdr (Omeprazole) .Marland Kitchen.. 1 Tab By Mouth Once Daily 9)  Aspirin Ec 325 Mg Tbec (Aspirin) .... Take One Tablet By Mouth Daily  Allergies (verified): No Known Drug Allergies  Past History:  Past medical, surgical, family and social histories (including risk factors) reviewed for relevance to current acute and chronic problems.  Past Medical History: Current Problems:  HYPERTENSION (ICD-401.9) FIBRILLATION, ATRIAL (ICD-427.31) AORTIC STENOSIS (ICD-424.1) ERECTILE DYSFUNCTION, ORGANIC (ICD-607.84) BENIGN POSITIONAL VERTIGO (ICD-386.11) GOUT (ICD-274.9) GERD (ICD-530.81) DEGENERATIVE JOINT DISEASE (ICD-715.90) MVP Vocal CA Baker's cyst left knee ventral hernia Arthritis  Past Surgical History: Reviewed history from 03/23/2009 and no changes required. Baker's cyst Torn Cartilage Inguinal herniorrhaphy aortic valve replacement secondary to aortic stenosis and thoracic  aortic aneurysm repair in July 2006. right and left hernia repair  Family History: Reviewed history from 04/17/2007 and no changes required. Family History Diabetes 1st degree relative Family History of Stroke F 1st degree relative <60 No FH of Colon Cancer:  Social History: Reviewed history from 04/17/2007 and no changes required. Retired Divorced, 1 girl Former Smoker Alcohol use-yes-social Drug use-no Daily Caffeine Use 3 cup coffee/day  Review of Systems  The patient denies allergy/sinus, anemia, anxiety-new, arthritis/joint pain, back pain, blood in  urine, breast changes/lumps, change in vision, confusion, cough, coughing up blood,  depression-new, fainting, fatigue, fever, headaches-new, hearing problems, heart murmur, heart rhythm changes, itching, muscle pains/cramps, night sweats, nosebleeds, shortness of breath, skin rash, sleeping problems, sore throat, swelling of feet/legs, swollen lymph glands, thirst - excessive, urination - excessive, urination changes/pain, urine leakage, and voice change.         The patient exercises regularly and denies and current cardiovascular or pulmonary complaints.  Vital Signs:  Patient profile:   75 year old male Height:      67 inches Weight:      171 pounds BMI:     26.88 Pulse rate:   68 / minute Pulse rhythm:   regular BP sitting:   110 / 64  (left arm) Cuff size:   regular  Vitals Entered By: Francee Piccolo CMA [name]) (Jan 25, 2010 1:31 PM)  Physical Exam  General:  Well developed, well nourished, no acute distress.healthy appearing.   Head:  Normocephalic and atraumatic. Eyes:  PERRLA, no icterus.exam deferred to patient's ophthalmologist.   Lungs:  Clear throughout to auscultation. Heart:  prominent 2-3/6 systolic ejection murmur without S3 gallop. He appears to be in a regular rhythm at this time. Abdomen:  Soft, nontender and nondistended. No masses, hepatosplenomegaly or hernias noted. Normal bowel sounds. Extremities:  No clubbing, cyanosis, edema or deformities noted. Neurologic:  Alert and  oriented x4;  grossly normal neurologically. Psych:  Alert and cooperative. Normal mood and affect.   Impression & Recommendations:  Problem # 1:  GERD (ICD-530.81) Assessment Improved Previous endoscopies have not shown any evidence of Barrett's mucosa. He is doing well currently with daily omeprazole and antireflux maneuvers. He has regular ENT followup at [facility].  Problem # 2:  SCREENING COLORECTAL-CANCER (ICD-V76.51) Assessment: Unchanged Extensive review of his records and previous colonoscopy and endoscopy reports showed no evidence of adenomatous  colon polyps. He had multiple hyperplastic polyps removed 5 years ago, and is currently asymptomatic in terms of GI issues. We will do immunologic Heme-select cards for occult blood. If these are negative, his colonoscopy does not need to be repeated at that time with his comorbid medical and cardiovascular conditions. I've asked me continue all of his cardiac medications as listed and reviewed in his chart.  Patient Instructions: 1)  Please go to the basement for lab instructions. 2)  The medication list was reviewed and reconciled.  All changed / newly prescribed medications were explained.  A complete medication list was provided to the patient / caregiver. 3)  Copy sent to :Dr. [name] and Dr. Olga Millers in cardiology.  4)  Please continue current medications.

## 2010-09-27 NOTE — Assessment & Plan Note (Signed)
Summary: DIZZINESS/CCM   Vital Signs:  Patient Profile:   75 Years Old Male Weight:      178 pounds Temp:     98 degrees F Pulse rate:   70 / minute Pulse rhythm:   regular Resp:     12 per minute BP sitting:   130 / 80                 Chief Complaint:  dizziness x one week.  History of Present Illness: [name] is a 75 year old male, who comes in today for evaluation of dizziness for about a week.  He says his spells come and go.  He feels "lightheaded.  They they mostly occur when he lays back.  He does last for a few seconds and goes away.  He said a history of a valve replacement in one stenosis.  Heart is okay.  Is also concerned that his blood pressure might be elevated.  Review of systems otherwise negative.  He said no hearing loss, and no neurologic symptoms.  Acute Visit History:      He denies abdominal pain, chest pain, constipation, cough, diarrhea, earache, eye symptoms, fever, genitourinary symptoms, headache, musculoskeletal symptoms, nasal discharge, nausea, rash, sinus problems, sore throat, and vomiting.         Current Allergies: No known allergies      Review of Systems      See HPI   Physical Exam  General:     Well-developed,well-nourished,in no acute distress; alert,appropriate and cooperative throughout examination Head:     Normocephalic and atraumatic without obvious abnormalities. No apparent alopecia or balding. Eyes:     No corneal or conjunctival inflammation noted. EOMI. Perrla. Funduscopic exam benign, without hemorrhages, exudates or papilledema. Vision grossly normal. Ears:     External ear exam shows no significant lesions or deformities.  Otoscopic examination reveals clear canals, tympanic membranes are intact bilaterally without bulging, retraction, inflammation or discharge. Hearing is grossly normal bilaterally. Nose:     External nasal examination shows no deformity or inflammation. Nasal mucosa are pink and moist without  lesions or exudates. Mouth:     Oral mucosa and oropharynx without lesions or exudates.  Teeth in good repair. Neck:     No deformities, masses, or tenderness noted. Neurologic:     No cranial nerve deficits noted. Station and gait are normal. Plantar reflexes are down-going bilaterally. DTRs are symmetrical throughout. Sensory, motor and coordinative functions appear intact.    Impression & Recommendations:  Problem # 1:  BENIGN POSITIONAL VERTIGO (ICD-386.11) Assessment: New  Complete Medication List: 1)  Allopurinol 300 Mg Tabs (Allopurinol) 2)  Tenoretic 50 50-25 Mg Tabs (Atenolol-chlorthalidone) .... 1/2 tablet by mouth once a day 3)  Viagra 100 Mg Tabs (Sildenafil citrate) 4)  Bl Aspirin 325 Mg Tabs (Aspirin) 5)  Zolpidem Tartrate 10 Mg Tabs (Zolpidem tartrate) 6)  Diazepam 5 Mg Tabs (Diazepam)   Patient Instructions: 1)  I think you have a condition called benign positional vertigo.  When you light backwards.  He get dizzy.  I would be careful about lying back quickly.  These are conditions.  He usually go away with time.  There is no particular medications.  That will help.  If he gets worse.  Please let us know also check your blood pressure twice a day and take all the data which you to the cardiologist when you see him next month    ]

## 2010-09-27 NOTE — Progress Notes (Signed)
Summary: [name]  Phone Note Outgoing Call   Summary of Call: okay to refill Ambien 10 mg, number 30 directions one nightly p.r.n. 5 refills Initial call taken by: Roderick Pee MD,  Jan 13, 2008 1:03 PM    New/Updated Medications: ZOLPIDEM TARTRATE 10 MG  TABS (ZOLPIDEM TARTRATE) one by mouth q hs as needed   Prescriptions: ZOLPIDEM TARTRATE 10 MG  TABS (ZOLPIDEM TARTRATE) one by mouth q hs as needed  #30 x 5   Entered by:   Lynann Beaver CMA   Authorized by:   Roderick Pee MD   Signed by:   Lynann Beaver CMA on 01/13/2008   Method used:   Telephoned to ...       [facility] [address]       [address]       [place], [place]  [address]       Ph: [phone]       Fax: [phone]   RxID:   [id number]

## 2010-09-27 NOTE — Assessment & Plan Note (Signed)
Summary: cpx/njr   Vital Signs:  Patient Profile:   75 Years Old Male Height:     67 inches Weight:      173 pounds Temp:     98.2 degrees F oral Pulse rate:   68 / minute BP sitting:   132 / 80  (left arm)  Vitals Entered By: Kern Reap CMA (Jan 21, 2008 10:47 AM)                 Chief Complaint:  cpx.  History of Present Illness: hypertension number dysfunction in exam for overall, he had a good year.  He just turned 72 to 82s.  Optimize health maintenance activities.  However, he refuses a rectal exam.  Said he had one lash caused pain.  Get a colonoscopy in 2008 which was normal.  His gout is treated with allopurinol 300 mg, daily he's doing well, and no problems.  The hypertension is treated with Tenoretic 50 -- 25 one half tablet daily.  Blood pressure 132/80.  No side effects.  Erectile dysfunction is treated with Viagra hundred milligrams p.r.n.  He takes a baby aspirin daily.  His anxiety is treated with Valium 5 mg p.r.n.  He says he takes two or 3 months.  The asymmetry, which runs along [place], .1% ointment, which he applies b.i.d., p.r.n.  The sleep dysfunction is treated with Ambien 10 mg nightly p.r.n.  He gets a yearly vocal cord examination by Dr. Jearld Fenton.  His ENT.  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  He also gets yearly cardiac exams by Dr. Jens Som his cardiologist.  He is status post valve replacement in 06 and doing well.  No problems.    Current Allergies: No known allergies   Past Medical History:    Reviewed history from 04/17/2007 and no changes required:       GERD       Gout       Hypertension       ED       MVP       Vocal CA       aortic stenosis, valve replacement is 6       right and left hernia repair       Baker's cyst left knee       ventral hernia   Family History:    Reviewed history from 04/17/2007 and no changes required:       Family History Diabetes 1st degree relative       Family History of Stroke F 1st  degree relative <60  Social History:    Reviewed history from 04/17/2007 and no changes required:       Retired       Married       Former Smoker       Alcohol use-yes       Drug use-no    Review of Systems      See HPI   Physical Exam  General:     Well-developed,well-nourished,in no acute distress; alert,appropriate and cooperative throughout examination Head:     Normocephalic and atraumatic without obvious abnormalities. No apparent alopecia or balding. Eyes:     No corneal or conjunctival inflammation noted. EOMI. Perrla. Funduscopic exam benign, without hemorrhages, exudates or papilledema. Vision grossly normal.juvenile cataracts Ears:     External ear exam shows no significant lesions or deformities.  Otoscopic examination reveals clear canals, tympanic membranes are intact bilaterally without bulging, retraction, inflammation or discharge. Hearing  is grossly normal bilaterally. Nose:     External nasal examination shows no deformity or inflammation. Nasal mucosa are pink and moist without lesions or exudates. Mouth:     Oral mucosa and oropharynx without lesions or exudates.  Teeth in good repair. Neck:     No deformities, masses, or tenderness noted. Chest Wall:     No deformities, masses, tenderness or gynecomastia noted. Breasts:     No masses or gynecomastia noted Lungs:     Normal respiratory effort, chest expands symmetrically. Lungs are clear to auscultation, no crackles or wheezes. Heart:     Normal rate and regular rhythm. S1 and S2 normal without gallop, murmur, click, .  there is a grade 2 systolic ejection murmur heard best over the pulmonic and aortic area.  It does radiate to the mitral area and also up into the neck.  The scar in the midline of his chest is well healed    Impression & Recommendations:  Problem # 1:  HYPERTENSION (ICD-401.9) Assessment: Improved  His updated medication list for this problem includes:    Tenoretic 50 50-25 Mg  Tabs (Atenolol-chlorthalidone) .Marland Kitchen... 1/2 tablet by mouth once a day  Orders: EKG w/ Interpretation (93000)   Problem # 2:  GOUT (ICD-274.9) Assessment: Improved  His updated medication list for this problem includes:    Allopurinol 300 Mg Tabs (Allopurinol)    Aleve 220 Mg Tabs (Naproxen sodium) ..... Once daily as needed   Problem # 3:  GERD (ICD-530.81) Assessment: Improved  His updated medication list for this problem includes:    Zegerid 40-1100 Mg Caps (Omeprazole-sodium bicarbonate) ..... Once daily   Problem # 4:  ERECTILE DYSFUNCTION, ORGANIC (ICD-607.84) Assessment: Improved  His updated medication list for this problem includes:    Viagra 100 Mg Tabs (Sildenafil citrate)   Problem # 5:  AORTIC STENOSIS (ICD-424.1) Assessment: Improved  His updated medication list for this problem includes:    Tenoretic 50 50-25 Mg Tabs (Atenolol-chlorthalidone) .Marland Kitchen... 1/2 tablet by mouth once a day    Bl Aspirin 325 Mg Tabs (Aspirin)   Complete Medication List: 1)  Allopurinol 300 Mg Tabs (Allopurinol) 2)  Tenoretic 50 50-25 Mg Tabs (Atenolol-chlorthalidone) .... 1/2 tablet by mouth once a day 3)  Viagra 100 Mg Tabs (Sildenafil citrate) 4)  Bl Aspirin 325 Mg Tabs (Aspirin) 5)  Zolpidem Tartrate 10 Mg Tabs (Zolpidem tartrate) .... One by mouth q hs as needed 6)  Diazepam 5 Mg Tabs (Diazepam) 7)  Triamcinolone Acetonide 0.1 % Oint (Triamcinolone acetonide) .... With eucerine cream--apply two times a day prn 8)  Zegerid 40-1100 Mg Caps (Omeprazole-sodium bicarbonate) .... Once daily 9)  Aleve 220 Mg Tabs (Naproxen sodium) .... Once daily as needed 10)  Vitamin E Mtc 1000 Unit Caps (Vitamin e) .... Once daily   Patient Instructions: 1)  Please schedule a follow-up appointment in 1 year. 2)  It is important that you exercise regularly at least 20 minutes 5 times a week. If you develop chest pain, have severe difficulty breathing, or feel very tired , stop exercising immediately  and seek medical attention. 3)  Take an Aspirin every day.   Prescriptions: ZEGERID 40-1100 MG  CAPS (OMEPRAZOLE-SODIUM BICARBONATE) once daily  #100 x 3   Entered and Authorized by:   Roderick Pee MD   Signed by:   Roderick Pee MD on 01/21/2008   Method used:   Print then Give to Patient   RxID:   [id number] TRIAMCINOLONE ACETONIDE 0.1 %  OINT (TRIAMCINOLONE ACETONIDE) with eucerine cream--apply two times a day prn  #1pound jar x 4   Entered and Authorized by:   Roderick Pee MD   Signed by:   Roderick Pee MD on 01/21/2008   Method used:   Print then Give to Patient   RxID:   [id number] DIAZEPAM 5 MG  TABS (DIAZEPAM)   #30 x 5   Entered and Authorized by:   Roderick Pee MD   Signed by:   Roderick Pee MD on 01/21/2008   Method used:   Print then Give to Patient   RxID:   [id number] ZOLPIDEM TARTRATE 10 MG  TABS (ZOLPIDEM TARTRATE) one by mouth q hs as needed  #30 x 5   Entered and Authorized by:   Roderick Pee MD   Signed by:   Roderick Pee MD on 01/21/2008   Method used:   Print then Give to Patient   RxID:   [id number] VIAGRA 100 MG TABS (SILDENAFIL CITRATE)   #6 x 11   Entered and Authorized by:   Roderick Pee MD   Signed by:   Roderick Pee MD on 01/21/2008   Method used:   Print then Give to Patient   RxID:   [id number] TENORETIC 50 50-25 MG TABS (ATENOLOL-CHLORTHALIDONE) 1/2 tablet by mouth once a day  #50 x 3   Entered and Authorized by:   Roderick Pee MD   Signed by:   Roderick Pee MD on 01/21/2008   Method used:   Print then Give to Patient   RxID:   [id number] ALLOPURINOL 300 MG TABS (ALLOPURINOL)   #100 Each x 3   Entered and Authorized by:   Roderick Pee MD   Signed by:   Roderick Pee MD on 01/21/2008   Method used:   Print then Give to Patient   RxID:   [id number] ZEGERID 40-1100 MG  CAPS (OMEPRAZOLE-SODIUM BICARBONATE) once daily  #100 x 3   Entered and Authorized by:   Roderick Pee MD    Signed by:   Roderick Pee MD on 01/21/2008   Method used:   Electronically sent to ...       [facility] [address]       [address]       [place], [place]  [address]       Ph: [phone]       Fax: [phone]   RxID:   [id number] TRIAMCINOLONE ACETONIDE 0.1 %  OINT (TRIAMCINOLONE ACETONIDE) with eucerine cream--apply two times a day prn  #1pound jar x 4   Entered and Authorized by:   Roderick Pee MD   Signed by:   Roderick Pee MD on 01/21/2008   Method used:   Electronically sent to ...       [facility] [address]       [address]       [place], [place]  [address]       Ph: [phone]       Fax: [phone]   RxID:   [id number] DIAZEPAM 5 MG  TABS (DIAZEPAM)   #30 x 5   Entered and Authorized by:   Roderick Pee MD   Signed by:   Roderick Pee MD on 01/21/2008   Method used:   Electronically sent to .Marland KitchenMarland Kitchen  [facility] [address]       [address]       [place], [place]  [address]       Ph: [phone]       Fax: [phone]   RxID:   [id number] ZOLPIDEM TARTRATE 10 MG  TABS (ZOLPIDEM TARTRATE) one by mouth q hs as needed  #30 x 5   Entered and Authorized by:   Roderick Pee MD   Signed by:   Roderick Pee MD on 01/21/2008   Method used:   Electronically sent to ...       [facility] [address]       [address]       [place], [place]  [address]       Ph: [phone]       Fax: [phone]   RxID:   [id number] VIAGRA 100 MG TABS (SILDENAFIL CITRATE)   #6 x 11   Entered and Authorized by:   Roderick Pee MD   Signed by:   Roderick Pee MD on 01/21/2008   Method used:   Electronically sent to ...       [facility] [address]       [address]       [place], [place]  [address]       Ph: [phone]       Fax: [phone]   RxID:   [id number]  TENORETIC 50 50-25 MG TABS (ATENOLOL-CHLORTHALIDONE) 1/2 tablet by mouth once a day  #50 x 3   Entered and Authorized by:   Roderick Pee MD   Signed by:   Roderick Pee MD on 01/21/2008   Method used:   Electronically sent to ...       [facility] [address]       [address]       [place], [place]  [address]       Ph: [phone]       Fax: [phone]   RxID:   [id number] ALLOPURINOL 300 MG TABS (ALLOPURINOL)   #100 Each x 3   Entered and Authorized by:   Roderick Pee MD   Signed by:   Roderick Pee MD on 01/21/2008   Method used:   Electronically sent to ...       [facility] [address]       [address]       [place], [place]  [address]       Ph: [phone]       Fax: [phone]   RxID:   [id number]  ]

## 2010-09-27 NOTE — Assessment & Plan Note (Signed)
Summary: [name]  Medications Added PRADAXA 150 MG CAPS (DABIGATRAN ETEXILATE MESYLATE) take one tablet by mouth two times a day        Primary Provider:  [name], MD  CC:  check up no complaints.  History of Present Illness: [name] is a very pleasant  gentleman who has a history of aortic valve replacement secondary to aortic stenosis with a pericardial tissue valve and thoracic aortic aneurysm repair in July 2006.  Preoperative cardiac catheterization revealed normal coronary arteries. His last  CTA was  performed in October of 2009. He has a history of atrial fibrillation and has had a previous DCCV. A TSH was normal. An echocardiogram performed on April 02, 2009 which revealed normal LV function, mild aortic insufficiency and a mean gradient across his aortic valve of 11 mm of mercury. Also note we performed carotid Dopplers due to bruits which revealed 0-39% bilateral stenosis. I last saw him in Feb 2011. He now denies dyspnea on exertion, orthopnea, PND, pedal edema, palpitations, syncope or chest pain. There is no bleeding.  Current Medications (verified): 1)  Allopurinol 300 Mg Tabs (Allopurinol) .... Once Daily 2)  Tenoretic 50 50-25 Mg Tabs (Atenolol-Chlorthalidone) .... 1/2 Tablet By Mouth Once A Day 3)  Zolpidem Tartrate 10 Mg  Tabs (Zolpidem Tartrate) .... One By Mouth Q Hs As Needed 4)  Diazepam 5 Mg  Tabs (Diazepam) .... Once Daily 5)  Triamcinolone Acetonide 0.1 %  Oint (Triamcinolone Acetonide) .... With Eucerine Cream--Apply Two Times A Day As Needed 1:1 Ratio 6)  Aleve 220 Mg  Tabs (Naproxen Sodium) .... Once Daily As Needed 7)  Omeprazole 10 Mg Cpdr (Omeprazole) .Marland Kitchen.. 1 Tab By Mouth Once Daily 8)  Aspirin Ec 325 Mg Tbec (Aspirin) .... Take One Tablet By Mouth Daily 9)  Levitra 20 Mg Tabs (Vardenafil Hcl) .... Uad  Allergies: No Known Drug Allergies  Past History:  Past Medical History: HYPERTENSION (ICD-401.9) FIBRILLATION, ATRIAL (ICD-427.31) AORTIC  STENOSIS (ICD-424.1) ERECTILE DYSFUNCTION, ORGANIC (ICD-607.84) BENIGN POSITIONAL VERTIGO (ICD-386.11) GOUT (ICD-274.9) GERD (ICD-530.81) DEGENERATIVE JOINT DISEASE (ICD-715.90) MVP Vocal CA Baker's cyst left knee ventral hernia Arthritis  Past Surgical History: Reviewed history from 03/31/2010 and no changes required. Baker's cyst Torn Cartilage Inguinal herniorrhaphy aortic valve replacement secondary to aortic stenosis and thoracic  aortic aneurysm repair in July 2006. right and left hernia repair left shoulder surgery  Social History: Reviewed history from 01/25/2010 and no changes required. Retired Divorced, 1 girl Former Smoker Alcohol use-yes-social Drug use-no Daily Caffeine Use 3 cup coffee/day  Review of Systems       Arthritis but no fevers or chills, productive cough, hemoptysis, dysphasia, odynophagia, melena, hematochezia, dysuria, hematuria, rash, seizure activity, orthopnea, PND, pedal edema, claudication. Remaining systems are negative.   Vital Signs:  Patient profile:   75 year old male Height:      67 inches Weight:      166 pounds BMI:     26.09 Pulse rate:   74 / minute Resp:     12 per minute BP sitting:   156 / 78  (left arm)  Vitals Entered By: Kem Parkinson (April 01, 2010 10:45 AM)  Physical Exam  General:  Well-developed well-nourished in no acute distress.  Skin is warm and dry.  HEENT is normal.  Neck is supple. No thyromegaly.  Chest is clear to auscultation with normal expansion.  Cardiovascular exam is irregular; 2/6 systolic murmur left sternal border. No diastolic murmur. Abdominal exam nontender or distended. No  masses palpated. Extremities show no edema. neuro grossly intact    EKG  Procedure date:  04/01/2010  Findings:      Atrial fibrillation at a rate of 74. Axis normal. No ST changes.  Impression & Recommendations:  Problem # 1:  HYPERTENSION (ICD-401.9) Blood pressure mildly elevated. I will follow  this and increase medications as needed. The following medications were removed from the medication list:    Aspirin Ec 325 Mg Tbec (Aspirin) .Marland Kitchen... Take one tablet by mouth daily His updated medication list for this problem includes:    Tenoretic 50 50-25 Mg Tabs (Atenolol-chlorthalidone) .Marland Kitchen... 1/2 tablet by mouth once a day  His updated medication list for this problem includes:    Tenoretic 50 50-25 Mg Tabs (Atenolol-chlorthalidone) .Marland Kitchen... 1/2 tablet by mouth once a day    Aspirin Ec 325 Mg Tbec (Aspirin) .Marland Kitchen... Take one tablet by mouth daily  Problem # 2:  FIBRILLATION, ATRIAL (ICD-427.31) Atrial fibrillation has recurred. However he is having no symptoms. I had long discussions today concerning rate control and anticoagulation versus rhythm control. Given that he has had no symptoms we have elected to continue with rate control. He has embolic risk factor of hypertension. His age is nearing 33. I would favor anticoagulation over aspirin. He would prefer pradaxa. I will discontinue aspirin and began at 150 mg p.o. b.i.d. Note his renal function is normal. Check renal function and CBC in 2 weeks. Schedule echocardiogram. Schedule 48 hr holter monitor to make sure that rate is controlled. Check TSH. The following medications were removed from the medication list:    Aspirin Ec 325 Mg Tbec (Aspirin) .Marland Kitchen... Take one tablet by mouth daily His updated medication list for this problem includes:    Tenoretic 50 50-25 Mg Tabs (Atenolol-chlorthalidone) .Marland Kitchen... 1/2 tablet by mouth once a day  Orders: EKG w/ Interpretation (93000) Holter Monitor (Holter Monitor)  Problem # 3:  AORTIC STENOSIS (ICD-424.1)  Status post aortic valve replacement. Continued SBE prophylaxis. Check echo. His updated medication list for this problem includes:    Tenoretic 50 50-25 Mg Tabs (Atenolol-chlorthalidone) .Marland Kitchen... 1/2 tablet by mouth once a day  Orders: Echocardiogram (Echo)  His updated medication list for this  problem includes:    Tenoretic 50 50-25 Mg Tabs (Atenolol-chlorthalidone) .Marland Kitchen... 1/2 tablet by mouth once a day  Problem # 4:  GERD (ICD-530.81)  His updated medication list for this problem includes:    Omeprazole 10 Mg Cpdr (Omeprazole) .Marland Kitchen... 1 tab by mouth once daily  His updated medication list for this problem includes:    Omeprazole 10 Mg Cpdr (Omeprazole) .Marland Kitchen... 1 tab by mouth once daily  Patient Instructions: 1)  Your physician recommends that you schedule a follow-up appointment in: 8 weeks 2)  Your physician recommends that you return for lab work in: 2 weeks--CBC,BMP,TSH 3)  Your physician has recommended you make the following change in your medication: Stop Aspirin 4)  Start Pradaxa 150 mg by mouth two times a day 5)  Your physician has requested that you have an echocardiogram.  Echocardiography is a painless test that uses sound waves to create images of your heart. It provides your doctor with information about the size and shape of your heart and how well your heart's chambers and valves are working.  This procedure takes approximately one hour. There are no restrictions for this procedure. 6)  Your physician has recommended that you wear a holter monitor.  Holter monitors are medical devices that record the heart's electrical  activity. Doctors most often use these monitors to diagnose arrhythmias. Arrhythmias are problems with the speed or rhythm of the heartbeat. The monitor is a small, portable device. You can wear one while you do your normal daily activities. This is usually used to diagnose what is causing palpitations/syncope (passing out). Prescriptions: PRADAXA 150 MG CAPS (DABIGATRAN ETEXILATE MESYLATE) take one tablet by mouth two times a day  #60 x 6   Entered by:   Dossie Arbour, RN, BSN   Authorized by:   Ferman Hamming, MD, [facility]   Signed by:   Dossie Arbour, RN, BSN on 04/01/2010   Method used:   Electronically to        [facility]*  (retail)       [address] [place], [place]  [address]       Ph: [phone]       Fax: [phone]   RxID:   [id number]

## 2010-09-27 NOTE — Medication Information (Signed)
Summary: rov/kmw  Anticoagulant Therapy  Managed by: Bethena Midget, RN, BSN Referring MD: Dr Olga Millers Supervising MD: Eden Emms MD, Theron Arista Indication 1: Atrial Fibrillation Lab Used: [facility] [place] Site: [address] INR POC 2.7 INR RANGE 2.0-3.0  Dietary changes: no    Health status changes: no    Bleeding/hemorrhagic complications: no    Recent/future hospitalizations: no    Any changes in medication regimen? no    Recent/future dental: no  Any missed doses?: no       Is patient compliant with meds? yes       Allergies: No Known Drug Allergies  Anticoagulation Management History:      The patient is taking warfarin and comes in today for a routine follow up visit.  Positive risk factors for bleeding include an age of 73 years or older.  The bleeding index is 'intermediate risk'.  Positive CHADS2 values include History of HTN.  Negative CHADS2 values include Age > 69 years old.  His last INR was 2.1.  Anticoagulation responsible provider: Eden Emms MD, Theron Arista.  INR POC: 2.7.  Cuvette Lot#: [id number].  Exp: 09/2010.    Anticoagulation Management Assessment/Plan:      The patient's current anticoagulation dose is Warfarin sodium 5 mg tabs: Use as directed by Anticoagulation Clinic.  The target INR is 2.0-3.0.  The next INR is due 09/02/2009.  Anticoagulation instructions were given to patient.  Results were reviewed/authorized by Bethena Midget, RN, BSN.  He was notified by Bethena Midget, RN, BSN.         Prior Anticoagulation Instructions: INR 2.9  Continue to take 1 tablet every day except on Monday and Friday take 1/2 tablet. Recheck INR in 3 weeks.  Current Anticoagulation Instructions: INR 2.7 Continue 1 tablet everyday except 1/2  tablets on Mondays and Fridays. Recheck in 4 weeks.

## 2010-09-27 NOTE — Progress Notes (Signed)
Summary: pt needs surgical clearence   Phone Note Call from Patient Call back at Home Phone [phone] Call back at [phone]   Caller: Patient Reason for Call: Talk to Nurse, Talk to Doctor Summary of Call: pt has to have hand surgery next week on the 9th and needs surgical clearence also needs to stop prodaxa for 5days prior to and needs to talk to you regarding this fax# [phone] Dr. Josephine Igo  Initial call taken by: Omer Jack,  April 25, 2010 3:35 PM  Follow-up for Phone Call        spoke with pt, he is needing a clearence note faxed and wants to make sure ok to stop pradaxa prior to procedure Deliah Goody, RN  April 25, 2010 5:00 PM   Additional Follow-up for Phone Call Additional follow up Details #1::        ok to dc pradaxa 5 days prior to procedure and resume 2 days after. Ferman Hamming, MD, [facility]  April 26, 2010 7:42 AM  note faxed to number provided Deliah Goody, RN  April 26, 2010 3:40 PM

## 2010-09-27 NOTE — Letter (Signed)
Summary: New Patient letter  [facility]  [address] [place], [place] [address]   Phone: [phone]  Fax: [phone]       12/29/2009 MRN: [id number]  [name] [address] [place], [place]  [address]  Dear Mr. Macchi,  Welcome to the Gastroenterology Division at [facility].    You are scheduled to see Dr.  Jarold Motto on 01/25/2010 at 2:00pm on the 3rd floor at [facility], [address].  We ask that you try to arrive at our office 15 minutes prior to your appointment time to allow for check-in.  We would like you to complete the enclosed self-administered evaluation form prior to your visit and bring it with you on the day of your appointment.  We will review it with you.  Also, please bring a complete list of all your medications or, if you prefer, bring the medication bottles and we will list them.  Please bring your insurance card so that we may make a copy of it.  If your insurance requires a referral to see a specialist, please bring your referral form from your primary care physician.  Co-payments are due at the time of your visit and may be paid by cash, check or credit card.     Your office visit will consist of a consult with your physician (includes a physical exam), any laboratory testing he/she may order, scheduling of any necessary diagnostic testing (e.g. x-ray, ultrasound, CT-scan), and scheduling of a procedure (e.g. Endoscopy, Colonoscopy) if required.  Please allow enough time on your schedule to allow for any/all of these possibilities.    If you cannot keep your appointment, please call [phone] to cancel or reschedule prior to your appointment date.  This allows [place] the opportunity to schedule an appointment for another patient in need of care.  If you do not cancel or reschedule by 5 p.m. the business day prior to your appointment date, you will be charged a $50.00 late cancellation/no-show fee.    Thank you for choosing  [facility] for your medical needs.  We appreciate the opportunity to care for you.  Please visit [place] at our website  to learn more about our practice.                     Sincerely,                                                             The Gastroenterology Division

## 2010-09-27 NOTE — Medication Information (Signed)
Summary: Coumadin Clinic  Anticoagulant Therapy  Managed by: Inactive Referring MD: Dr Olga Millers Supervising MD: Eden Emms MD, Theron Arista Indication 1: Atrial Fibrillation Lab Used: [facility] [place] Site: [address] INR RANGE 2.0-3.0          Comments: 10/06/09- Dr Jens Som discontinued couomadin and started ASA.   Allergies: No Known Drug Allergies  Anticoagulation Management History:      Positive risk factors for bleeding include an age of 73 years or older.  The bleeding index is 'intermediate risk'.  Positive CHADS2 values include History of HTN.  Negative CHADS2 values include Age > 73 years old.  His last INR was 2.1.  Anticoagulation responsible provider: Eden Emms MD, Theron Arista.  Exp: 09/2010.    Anticoagulation Management Assessment/Plan:      The target INR is 2.0-3.0.  The next INR is due 09/02/2009.  Anticoagulation instructions were given to patient.  Results were reviewed/authorized by Inactive.         Prior Anticoagulation Instructions: INR 2.7 Continue 1 tablet everyday except 1/2  tablets on Mondays and Fridays. Recheck in 4 weeks.

## 2010-09-27 NOTE — Assessment & Plan Note (Signed)
Summary: per check out/sf      Allergies Added: NKDA  Visit Type:  Follow-up Primary Provider:  [name], MD  CC:  no complaints.  History of Present Illness: [name] is a very pleasant  gentleman who has a history of aortic valve replacement secondary to aortic stenosis with a pericardial tissue valve and thoracic aortic aneurysm repair in July 2006.  Preoperative cardiac catheterization revealed normal coronary arteries. His last  CTA was  performed in October of 2009. He has a history of atrial fibrillation and has had a previous DCCV. A TSH was normal. An echocardiogram performed in August of 2011  revealed normal LV function, trivial aortic insufficiency and biatrial enlargement. Also note we previously performed carotid Dopplers due to bruits which revealed 0-39% bilateral stenosis. I last saw him in August of  2011. A monitor in August of 2011showed occasional 3.1 second all. Tenoretic was discontinued and Norvasc added for his blood pressure. He now denies dyspnea on exertion, orthopnea, PND, pedal edema, palpitations, syncope or chest pain. There is no bleeding.  Current Medications (verified): 1)  Allopurinol 300 Mg Tabs (Allopurinol) .... Once Daily 2)  Amlodipine Besylate 5 Mg Tabs (Amlodipine Besylate) .... Take One Tablet By Mouth Daily 3)  Zolpidem Tartrate 10 Mg  Tabs (Zolpidem Tartrate) .... One By Mouth Q Hs As Needed 4)  Diazepam 5 Mg  Tabs (Diazepam) .... Once Daily 5)  Triamcinolone Acetonide 0.1 %  Oint (Triamcinolone Acetonide) .... With Eucerine Cream--Apply Two Times A Day As Needed 1:1 Ratio 6)  Aleve 220 Mg  Tabs (Naproxen Sodium) .... Once Daily As Needed 7)  Omeprazole 10 Mg Cpdr (Omeprazole) .Marland Kitchen.. 1 Tab By Mouth Once Daily 8)  Levitra 20 Mg Tabs (Vardenafil Hcl) .... Uad 9)  Pradaxa 150 Mg Caps (Dabigatran Etexilate Mesylate) .... Take One Tablet By Mouth Two Times A Day  Allergies (verified): No Known Drug Allergies  Past History:  Past Medical  History: Reviewed history from 04/01/2010 and no changes required. HYPERTENSION (ICD-401.9) FIBRILLATION, ATRIAL (ICD-427.31) AORTIC STENOSIS (ICD-424.1) ERECTILE DYSFUNCTION, ORGANIC (ICD-607.84) BENIGN POSITIONAL VERTIGO (ICD-386.11) GOUT (ICD-274.9) GERD (ICD-530.81) DEGENERATIVE JOINT DISEASE (ICD-715.90) MVP Vocal CA Baker's cyst left knee ventral hernia Arthritis  Past Surgical History: Reviewed history from 03/31/2010 and no changes required. Baker's cyst Torn Cartilage Inguinal herniorrhaphy aortic valve replacement secondary to aortic stenosis and thoracic  aortic aneurysm repair in July 2006. right and left hernia repair left shoulder surgery  Social History: Reviewed history from 01/25/2010 and no changes required. Retired Divorced, 1 girl Former Smoker Alcohol use-yes-social Drug use-no Daily Caffeine Use 3 cup coffee/day  Review of Systems       no fevers or chills, productive cough, hemoptysis, dysphasia, odynophagia, melena, hematochezia, dysuria, hematuria, rash, seizure activity, orthopnea, PND, pedal edema, claudication. Remaining systems are negative.   Vital Signs:  Patient profile:   75 year old male Height:      67 inches Weight:      174 pounds BMI:     27.35 Pulse rate:   80 / minute BP sitting:   138 / 70  (left arm) Cuff size:   regular  Vitals Entered By: Caralee Ates CMA (June 07, 2010 10:17 AM)  Physical Exam  General:  Well-developed well-nourished in no acute distress.  Skin is warm and dry.  HEENT is normal.  Neck is supple. No thyromegaly.  Chest is clear to auscultation with normal expansion.  Cardiovascular exam is regular rate and rhythm. 2/6 systolic murmur LSB  Abdominal exam nontender or distended. No masses palpated. Extremities show no edema. neuro grossly intact    Impression & Recommendations:  Problem # 1:  HYPERTENSION (ICD-401.9)  Blood pressure controlled; continue present meds. His updated  medication list for this problem includes:    Amlodipine Besylate 5 Mg Tabs (Amlodipine besylate) .Marland Kitchen... Take one tablet by mouth daily    His updated medication list for this problem includes:    Amlodipine Besylate 5 Mg Tabs (Amlodipine besylate) .Marland Kitchen... Take one tablet by mouth daily  Orders: TLB-BMP (Basic Metabolic Panel-BMET) (80048-METABOL) TLB-CBC Platelet - w/Differential (85025-CBCD)  Problem # 2:  FIBRILLATION, ATRIAL (ICD-427.31)  Heart rate controlled. Continue pradaxa; check bmet and cbc.  Orders: TLB-BMP (Basic Metabolic Panel-BMET) (80048-METABOL) TLB-CBC Platelet - w/Differential (85025-CBCD)  Problem # 3:  AORTIC STENOSIS (ICD-424.1) S/P AVR; continue SBE prophylaxis.  Problem # 4:  GERD (ICD-530.81)  His updated medication list for this problem includes:    Omeprazole 10 Mg Cpdr (Omeprazole) .Marland Kitchen... 1 tab by mouth once daily  His updated medication list for this problem includes:    Omeprazole 10 Mg Cpdr (Omeprazole) .Marland Kitchen... 1 tab by mouth once daily  Patient Instructions: 1)  Your physician recommends that you schedule a follow-up appointment in: 6 months with Dr. Jens Som 2)  Your physician recommends that you have lab work today: cbc, bmet

## 2010-09-27 NOTE — Medication Information (Signed)
Summary: rov/sp  Anticoagulant Therapy  Managed by: Charolotte Eke, PharmD Referring MD: Dr Olga Millers Supervising MD: Antoine Poche MD, Fayrene Fearing Indication 1: Atrial Fibrillation Lab Used: [facility] [place] Site: [address] INR POC 2.9 INR RANGE 2.0-3.0  Dietary changes: no    Health status changes: no    Bleeding/hemorrhagic complications: no    Recent/future hospitalizations: no    Any changes in medication regimen? no    Recent/future dental: no  Any missed doses?: no       Is patient compliant with meds? yes       Allergies (verified): No Known Drug Allergies  Anticoagulation Management History:      The patient is taking warfarin and comes in today for a routine follow up visit.  Positive risk factors for bleeding include an age of 73 years or older.  The bleeding index is 'intermediate risk'.  Positive CHADS2 values include History of HTN.  Negative CHADS2 values include Age > 73 years old.  His last INR was 2.1.  Anticoagulation responsible provider: Antoine Poche MD, Fayrene Fearing.  INR POC: 2.9.  Cuvette Lot#: [id number].  Exp: 05/2010.    Anticoagulation Management Assessment/Plan:      The patient's current anticoagulation dose is Warfarin sodium 5 mg tabs: Use as directed by Anticoagulation Clinic.  The target INR is 2.0-3.0.  The next INR is due 08/05/2009.  Anticoagulation instructions were given to patient.  Results were reviewed/authorized by Charolotte Eke, PharmD.  He was notified by Baron Sane, PharmD Candidate.         Prior Anticoagulation Instructions: INR 2.0  Take 1 1/2 tablets today then continue same dose of 1 tablet every day except 1/2 tablet on Monday and Friday   Current Anticoagulation Instructions: INR 2.9  Continue to take 1 tablet every day except on Monday and Friday take 1/2 tablet. Recheck INR in 3 weeks.

## 2010-09-27 NOTE — Assessment & Plan Note (Signed)
Summary: ROV/ NEW ONSET OF AFIB/ GD  Medications Added VIAGRA 100 MG TABS (SILDENAFIL CITRATE) as needed OMEPRAZOLE 10 MG CPDR (OMEPRAZOLE) 1 tab by mouth once daily WARFARIN SODIUM 5 MG TABS (WARFARIN SODIUM) Use as directed by Anticoagulation Clinic        CC:  no complaints.  History of Present Illness: [name] is a very pleasant  gentleman who has a history of aortic valve replacement secondary to aortic stenosis and thoracic aortic aneurysm repair in July 2006. I last saw him in October of 2009. His last echocardiogram was performed in October of 2009 and showed normal LV function and a normally functioning aortic homograft. A CTA was also performed at that time. There was no aortic aneurysm. Since I last saw him he was seen by his primary care physician an electrocardiogram revealed atrial fibrillation. This was new compared to previous. A TSH was normal.  The patient denies any increased dyspnea, chest pain, palpitations or syncope. He has no history of bleeding. He states he would not realize he was in atrial fibrillation unless they had told him.  Current Medications (verified): 1)  Allopurinol 300 Mg Tabs (Allopurinol) .... Once Daily 2)  Tenoretic 50 50-25 Mg Tabs (Atenolol-Chlorthalidone) .... 1/2 Tablet By Mouth Once A Day 3)  Viagra 100 Mg Tabs (Sildenafil Citrate) .... As Needed 4)  Aspirin 81 Mg Tbec (Aspirin) .... Take One Tablet By Mouth Daily 5)  Zolpidem Tartrate 10 Mg  Tabs (Zolpidem Tartrate) .... One By Mouth Q Hs As Needed 6)  Diazepam 5 Mg  Tabs (Diazepam) .... Once Daily 7)  Triamcinolone Acetonide 0.1 %  Oint (Triamcinolone Acetonide) .... With Eucerine Cream--Apply Two Times A Day As Needed 1:1 Ratio 8)  Aleve 220 Mg  Tabs (Naproxen Sodium) .... Once Daily As Needed 9)  Vitamin E Mtc 1000 Unit  Caps (Vitamin E) .... Once Daily 10)  Omeprazole 10 Mg Cpdr (Omeprazole) .Marland Kitchen.. 1 Tab By Mouth Once Daily  Allergies: No Known Drug Allergies  Past History:  Past  Medical History: Reviewed history from 03/22/2009 and no changes required. Current Problems:  HYPERTENSION (ICD-401.9) FIBRILLATION, ATRIAL (ICD-427.31) AORTIC STENOSIS (ICD-424.1) ERECTILE DYSFUNCTION, ORGANIC (ICD-607.84) BENIGN POSITIONAL VERTIGO (ICD-386.11) FAMILY HISTORY DIABETES 1ST DEGREE RELATIVE (ICD-V18.0) GOUT (ICD-274.9) GERD (ICD-530.81) DEGENERATIVE JOINT DISEASE (ICD-715.90)  MVP Vocal CA Baker's cyst left knee ventral hernia  Past Surgical History: Baker's cyst Torn Cartilage Inguinal herniorrhaphy aortic valve replacement secondary to aortic stenosis and thoracic  aortic aneurysm repair in July 2006. right and left hernia repair  Social History: Reviewed history from 04/17/2007 and no changes required. Retired Married Former Smoker Alcohol use-yes Drug use-no  Review of Systems       no fevers or chills, productive cough, hemoptysis, dysphasia, odynophagia, melena, hematochezia, dysuria, hematuria, rash, seizure activity, orthopnea, PND, pedal edema, claudication. Remaining systems are negative.   Vital Signs:  Patient profile:   75 year old male Height:      67 inches Weight:      167 pounds BMI:     26.25 Pulse rate:   65 / minute Resp:     12 per minute BP sitting:   146 / 80  (left arm)  Vitals Entered By: Kem Parkinson (March 23, 2009 9:30 AM)  Physical Exam  General:  well-developed well-nourished in no acute distress. Skin is warm and dry. Head:  HEENT is normal. Neck:  supple with left carotid bruit. No thyromegaly noted. Lungs:  clear to auscultation Heart:  irregular rhythm;  2/6 systolic murmur left sternal border. No diastolic murmur. Abdomen:  not tender or distended. No masses palpated. Extremities:  no edema. Neurologic:  grossly intact.   EKG  Procedure date:  03/23/2009  Findings:      atrial fibrillation with occasional PVCs. Rate is 65.  Impression & Recommendations:  Problem # 1:  FIBRILLATION, ATRIAL  (ICD-427.31) The patient has new-onset atrial fibrillation. A TSH recently was normal. We will repeat his echocardiogram. His rate is controlled on his present medications. I will discontinue his aspirin and vitamin E. We will add Coumadin at 7.5 mg today, 5 mg p.o. the following 2 days and then 5 mg alternating with 2.5 mg. His PT will be checked in the Coumadin clinic on July 31. Once he has been therapeutic for 3 consecutive weeks we will proceed with outpatient cardioversion. I do not think he will require long-term Coumadin as his only embolic risk factor is hypertension. We will change to aspirin 4 weeks after his cardioversion. The following medications were removed from the medication list:    Bl Aspirin 325 Mg Tabs (Aspirin) His updated medication list for this problem includes:    Tenoretic 50 50-25 Mg Tabs (Atenolol-chlorthalidone) .Marland Kitchen... 1/2 tablet by mouth once a day    Warfarin Sodium 5 Mg Tabs (Warfarin sodium) ..... Use as directed by anticoagulation clinic  Problem # 2:  HYPERTENSION (ICD-401.9)  His blood pressure is controlled on present medications. Will continue. The following medications were removed from the medication list:    Bl Aspirin 325 Mg Tabs (Aspirin) His updated medication list for this problem includes:    Tenoretic 50 50-25 Mg Tabs (Atenolol-chlorthalidone) .Marland Kitchen... 1/2 tablet by mouth once a day  The following medications were removed from the medication list:    Bl Aspirin 325 Mg Tabs (Aspirin) His updated medication list for this problem includes:    Tenoretic 50 50-25 Mg Tabs (Atenolol-chlorthalidone) .Marland Kitchen... 1/2 tablet by mouth once a day  Problem # 3:  CAROTID BRUIT (ICD-785.9) Check carotid Dopplers. Orders: Carotid Duplex (Carotid Duplex)  Problem # 4:  AORTIC STENOSIS (ICD-424.1)  Status post aortic valve replacement. Continue SBE prophylaxis. His updated medication list for this problem includes:    Tenoretic 50 50-25 Mg Tabs  (Atenolol-chlorthalidone) .Marland Kitchen... 1/2 tablet by mouth once a day  His updated medication list for this problem includes:    Tenoretic 50 50-25 Mg Tabs (Atenolol-chlorthalidone) .Marland Kitchen... 1/2 tablet by mouth once a day  Problem # 5:  GOUT (ICD-274.9)  His updated medication list for this problem includes:    Allopurinol 300 Mg Tabs (Allopurinol) ..... Once daily  His updated medication list for this problem includes:    Allopurinol 300 Mg Tabs (Allopurinol) ..... Once daily  Problem # 6:  GERD (ICD-530.81)  The following medications were removed from the medication list:    Zegerid 40-1100 Mg Caps (Omeprazole-sodium bicarbonate) .Marland Kitchen..Marland Kitchen Two times a day His updated medication list for this problem includes:    Omeprazole 10 Mg Cpdr (Omeprazole) .Marland Kitchen... 1 tab by mouth once daily  The following medications were removed from the medication list:    Zegerid 40-1100 Mg Caps (Omeprazole-sodium bicarbonate) .Marland Kitchen..Marland Kitchen Two times a day His updated medication list for this problem includes:    Omeprazole 10 Mg Cpdr (Omeprazole) .Marland Kitchen... 1 tab by mouth once daily  Other Orders: Echocardiogram (Echo)  Patient Instructions: 1)  Your physician recommends that you schedule a follow-up appointment in: 8 WEEKS 2)  Your physician has recommended you make the  following change in your medication: STOP ASPIRIN 3)  STOP VIT E 4)  START WARFARIN SODIUM 5MG - TAKE ONE AND ONE HALF TABLET TODAY 5)  TAKE ONE TABLET BOTH WEDNESDAY AND THURSDAY 6)  You have been referred to COUMADIN CLINIC FOR PROTIME CHECK ON FRIDAY 03-26-09 7)  Your physician has requested that you have a carotid duplex. This test is an ultrasound of the carotid arteries in your neck. It looks at blood flow through these arteries that supply the brain with blood. Allow one hour for this exam. There are no restrictions or special instructions. 8)  Your physician has requested that you have an echocardiogram.  Echocardiography is a painless test that uses sound  waves to create images of your heart. It provides your doctor with information about the size and shape of your heart and how well your heart's chambers and valves are working.  This procedure takes approximately one hour. There are no restrictions for this procedure. 9)  Your physician has recommended that you have a cardioversion (DCCV).  Electrical cardioversion uses a jolt of electricity to your heart either through paddles or wired patches attached to your chest. This is a controlled, usually prescheduled, procedure. Defibrillation is done under light anesthesia in the hospital, and you usually go home the day of the procedure. This is done to get your heart back into a normal rhythm. You are not awake for the procedure. Please see the instruction sheet given to you today. Prescriptions: WARFARIN SODIUM 5 MG TABS (WARFARIN SODIUM) Use as directed by Anticoagulation Clinic  #60 x 12   Entered by:   Deliah Goody, RN   Authorized by:   Ferman Hamming, MD, [facility]   Signed by:   Deliah Goody, RN on 03/23/2009   Method used:   Electronically to        [facility]* (retail)       [address] [place], [place]  [address]       Ph: [phone]       Fax: [phone]   RxID:   [id number]

## 2010-09-27 NOTE — Procedures (Signed)
Summary: Colon   Colonoscopy  Procedure date:  09/23/2003  Findings:      Location:  [facility].   Patient Name: [name], [name] MRN:  Procedure Procedures: Colonoscopy CPT: [phone].    with Hot Biopsy(s)CPT: [id number].    with polypectomy. CPT: [id number].  Personnel: Endoscopist: Vania Rea. Jarold Motto, MD.  Exam Location: Exam performed in Outpatient Clinic. Outpatient  Patient Consent: Procedure, Alternatives, Risks and Benefits discussed, consent obtained, from patient. Consent was obtained by the RN.  Indications  Evaluation of: Positive fecal occult blood test per home screening.  History  Current Medications: Patient is not currently taking Coumadin.  Pre-Exam Physical: Performed Sep 23, 2003. Cardio-pulmonary exam, Rectal exam, Abdominal exam, Extremity exam, Mental status exam WNL.  Exam Exam: Extent of exam reached: Cecum, extent intended: Cecum.  The cecum was identified by appendiceal orifice and IC valve. Patient position: on left side. Duration of exam: 30 minutes. Colon retroflexion performed. Images taken. ASA Classification: I. Tolerance: excellent.  Monitoring: Pulse and BP monitoring, Oximetry used. Supplemental O2 given. at 2 Liters.  Colon Prep Used Golytely for colon prep. Prep results: excellent.  Sedation Meds: Patient assessed and found to be appropriate for moderate (conscious) sedation. Fentanyl 100 mcg. given IV. Versed 10 mg. given IV.  Instrument(s): CF 140L. Serial [id number].  Findings - NORMAL EXAM: Cecum to Transverse Colon. Not Seen: Polyps. AVM's. Colitis. Tumors. Crohn's. Diverticulosis.  - DIVERTICULOSIS: Descending Colon to Sigmoid Colon. Not bleeding. ICD9: Diverticulosis, Colon: 562.10.  - OTHER FINDING: Ileum. Comments: Benign nodular hyperplasia.  - POLYP: Sigmoid Colon, Maximum size: 10 mm. sessile polyp. Procedure:  snare with cautery, removed, retrieved, Polyp sent to pathology. ICD9: Colon Polyps: 211.3.  -  MULTIPLE POLYPS: Sigmoid Colon to Rectum. minimum size 1 mm, maximum size 3 mm. Procedure:  hot biopsy, removed, retrieved, Polyps sent to pathology.   Assessment  Diagnoses: 211.3: Colon Polyps. R/O atypia.  562.10: Diverticulosis, Colon.   Events  Unplanned Interventions: No intervention was required.  Plans Medication Plan: Await pathology. Continue current medications.  Patient Education: Patient given standard instructions for: Polyps. Diverticulosis. Patient instructed to get routine colonoscopy every 3 years.  Disposition: After procedure patient sent to recovery. After recovery patient sent home.  Scheduling/Referral: Follow-Up prn. Await pathology to schedule patient.    cc: Tinnie Gens A. Tawanna Cooler, MD     This report was created from the original endoscopy report, which was reviewed and signed by the above listed endoscopist.

## 2010-09-27 NOTE — Medication Information (Signed)
Summary: ROV/TM  Anticoagulant Therapy  Managed by: Weston Brass, PharmD Referring MD: Dr Olga Millers Supervising MD: Johney Frame MD, Fayrene Fearing Indication 1: Atrial Fibrillation Lab Used: [facility] [place] Site: [address] INR POC 2.0 INR RANGE 2.0-3.0  Dietary changes: no    Health status changes: no    Bleeding/hemorrhagic complications: no    Recent/future hospitalizations: yes       Details: pending cardioversion on 10/12  Any changes in medication regimen? no    Recent/future dental: no  Any missed doses?: no       Is patient compliant with meds? yes       Allergies: No Known Drug Allergies  Anticoagulation Management History:      The patient is taking warfarin and comes in today for a routine follow up visit.  Positive risk factors for bleeding include an age of 73 years or older.  The bleeding index is 'intermediate risk'.  Positive CHADS2 values include History of HTN.  Negative CHADS2 values include Age > 73 years old.  His last INR was 2.1.  Anticoagulation responsible provider: Carsen Machi MD, Fayrene Fearing.  INR POC: 2.0.  Cuvette Lot#: [id number].  Exp: 05/2010.    Anticoagulation Management Assessment/Plan:      The patient's current anticoagulation dose is Warfarin sodium 5 mg tabs: Use as directed by Anticoagulation Clinic.  The target INR is 2.0-3.0.  The next INR is due 07/15/2009.  Anticoagulation instructions were given to patient.  Results were reviewed/authorized by Weston Brass, PharmD.  He was notified by Weston Brass PharmD.         Prior Anticoagulation Instructions: INR 2.4  Continue to take 1 tablet every day except on Monday and Friday take 1/2 tablet. Recheck INR in 1 week.   Current Anticoagulation Instructions: INR 2.0  Take 1 1/2 tablets today then continue same dose of 1 tablet every day except 1/2 tablet on Monday and Friday

## 2010-09-27 NOTE — Assessment & Plan Note (Signed)
Summary: CPX // RS   Vital Signs:  Patient profile:   75 year old male Height:      67 inches Weight:      168 pounds Temp:     98.5 degrees F oral BP sitting:   130 / 80  (left arm) Cuff size:   regular  Vitals Entered By: Kern Reap CMA [name]) (March 31, 2010 10:19 AM) CC: cpx Is Patient Diabetic? No   Primary Care Provider:  [name], MD  CC:  cpx.  History of Present Illness: [name] is a 75 year old.......[name] remarried......... his first wife died of pancreatic cancer 25 years ago........ who comes in today for general physical examination because of underlying hypertension, and sleep dysfunction, anxiety, gout, reflux esophagitis, atrial fibrillation erectile dysfunction.  He takes Tenoretic 50 -- 25 dose one half tab q.a.m., BP 130/80.  He also takes Ambien 10 mg 3 times weekly for sleep dysfunction.  He takes Aleve 220 mg daily for osteoarthritis.  He also takes Valium 5 mg daily for anxiety.  He takes allopurinol 300 mg daily to prevent gout.  His urologist, Dr. Aldean Francis has switched him from Viagra to Levitra.  He also takes omeprazole 10 mg daily for reflux and one aspirin tablet daily.  He gets routine eye care, dental care, colonoscopy in  GI by [name], tetanus, 2005, Pneumovax 2006, declines flu shot.  He is going to cardiology tomorrow for follow-up of his atrial fibrillation and is bilateral carotid bruits and aortic stenosis............[name] valve replacement by [name] to 5 years ago  he sees [name] at [facility] for follow-up on his vocal cord issue.  He also sees [name] because of his severe osteoarthritis in his left knee.Here for Medicare AWV:  1.   Risk factors based on Past M, S, F history:.Jared KitchenMarland KitchenMarland Kitchenreviewed no change 2.   Physical Activities: ... swims or walks daily 3.   Depression/mood: good.  No depression 4.   Hearing: normal 5.   ADL's: normal 6.   Fall Risk: reviewed 7.   Home Safety: reviewed 8.   Height, weight, &visual  acuity:height weight stable.  Annual eye exam normal 9.   Counseling: .......continue exercise program 10.   Labs ordered based on risk factors: done, the normal 11.           Referral Coordination,,,none indicated 12.           Care Plan.......Jared Kitchenreviewed all medications and exercise program 13.            Cognitive Assessment ..........normal mentation  Allergies: No Known Drug Allergies  Past History:  Past medical, surgical, family and social histories (including risk factors) reviewed, and no changes noted (except as noted below).  Past Medical History: Reviewed history from 01/25/2010 and no changes required. Current Problems:  HYPERTENSION (ICD-401.9) FIBRILLATION, ATRIAL (ICD-427.31) AORTIC STENOSIS (ICD-424.1) ERECTILE DYSFUNCTION, ORGANIC (ICD-607.84) BENIGN POSITIONAL VERTIGO (ICD-386.11) GOUT (ICD-274.9) GERD (ICD-530.81) DEGENERATIVE JOINT DISEASE (ICD-715.90) MVP Vocal CA Baker's cyst left knee ventral hernia Arthritis  Past Surgical History: Baker's cyst Torn Cartilage Inguinal herniorrhaphy aortic valve replacement secondary to aortic stenosis and thoracic  aortic aneurysm repair in July 2006. right and left hernia repair left shoulder surgery  Family History: Reviewed history from 01/25/2010 and no changes required. Family History Diabetes 1st degree relative Family History of Stroke F 1st degree relative <60 No FH of Colon Cancer:  Social History: Reviewed history from 01/25/2010 and no changes required. Retired Divorced, 1 girl Former Smoker Alcohol  use-yes-social Drug use-no Daily Caffeine Use 3 cup coffee/day  Review of Systems      See HPI  Physical Exam  General:  Well-developed,well-nourished,in no acute distress; alert,appropriate and cooperative throughout examination Head:  Normocephalic and atraumatic without obvious abnormalities. No apparent alopecia or balding. Eyes:  No corneal or conjunctival inflammation noted. EOMI. Perrla.  Funduscopic exam benign, without hemorrhages, exudates or papilledema. Vision grossly normal. Ears:  External ear exam shows no significant lesions or deformities.  Otoscopic examination reveals clear canals, tympanic membranes are intact bilaterally without bulging, retraction, inflammation or discharge. Hearing is grossly normal bilaterally. Nose:  External nasal examination shows no deformity or inflammation. Nasal mucosa are pink and moist without lesions or exudates. Mouth:  Oral mucosa and oropharynx without lesions or exudates.  Teeth in good repair. Neck:  No deformities, masses, or tenderness noted. Chest Wall:  No deformities, masses, tenderness or gynecomastia noted. Breasts:  No masses or gynecomastia noted Lungs:  Normal respiratory effort, chest expands symmetrically. Lungs are clear to auscultation, no crackles or wheezes. Heart:  the PMI is nonpalpable.  There is a soft grade 2 systolic murmur.  No diastolic murmur no clicks....... faint right and left carotid bruits Abdomen:  Bowel sounds positive,abdomen soft and non-tender without masses, organomegaly or hernias noted. Rectal:  uro Genitalia:  uro Prostate:  uro Msk:  he has a cystic lesion in his right hand also evidence of osteoarthritis and left knee. Pulses:  R and L carotid,radial,femoral,dorsalis pedis and posterior tibial pulses are full and equal bilaterally Extremities:  No clubbing, cyanosis, edema, or deformity noted with normal full range of motion of all joints.   Neurologic:  No cranial nerve deficits noted. Station and gait are normal. Plantar reflexes are down-going bilaterally. DTRs are symmetrical throughout. Sensory, motor and coordinative functions appear intact. Skin:  Intact without suspicious lesions or rashes Cervical Nodes:  No lymphadenopathy noted Axillary Nodes:  No palpable lymphadenopathy Inguinal Nodes:  No significant adenopathy Psych:  Cognition and judgment appear intact. Alert and cooperative  with normal attention span and concentration. No apparent delusions, illusions, hallucinations   Impression & Recommendations:  Problem # 1:  CAROTID BRUIT (ICD-785.9) Assessment Unchanged  Problem # 2:  HYPERTENSION (ICD-401.9) Assessment: Improved  His updated medication list for this problem includes:    Tenoretic 50 50-25 Mg Tabs (Atenolol-chlorthalidone) .[name]... 1/2 tablet by mouth once a day  Orders: Prescription Created Electronically [phone]) First annual wellness visit with prevention plan  (U0454)  Problem # 3:  GOUT (ICD-274.9) Assessment: Improved  His updated medication list for this problem includes:    Allopurinol 300 Mg Tabs (Allopurinol) ..... Once daily  Orders: Prescription Created Electronically [phone] First annual wellness visit with prevention plan  (B1478)  Problem # 4:  GERD (ICD-530.81) Assessment: Improved  His updated medication list for this problem includes:    Omeprazole 10 Mg Cpdr (Omeprazole) .[name]... 1 tab by mouth once daily  Orders: Prescription Created Electronically [phone]) First annual wellness visit with prevention plan  (Z3086)  Problem # 5:  DEGENERATIVE JOINT DISEASE (ICD-715.90) Assessment: Unchanged  His updated medication list for this problem includes:    Aleve 220 Mg Tabs (Naproxen sodium) ..... Once daily as needed    Aspirin Ec 325 Mg Tbec (Aspirin) .[name]... Take one tablet by mouth daily  Orders: Prescription Created Electronically [phone]) First annual wellness visit with prevention plan  (N6295)  Complete Medication List: 1)  Allopurinol 300 Mg Tabs (Allopurinol) .... Once daily 2)  Tenoretic 50  50-25 Mg Tabs (Atenolol-chlorthalidone) .... 1/2 tablet by mouth once a day 3)  Zolpidem Tartrate 10 Mg Tabs (Zolpidem tartrate) .... One by mouth q hs as needed 4)  Diazepam 5 Mg Tabs (Diazepam) .... Once daily 5)  Triamcinolone Acetonide 0.1 % Oint (Triamcinolone acetonide) .... With eucerine cream--apply two times a day as  needed 1:1 ratio 6)  Aleve 220 Mg Tabs (Naproxen sodium) .... Once daily as needed 7)  Omeprazole 10 Mg Cpdr (Omeprazole) .[name].. 1 tab by mouth once daily 8)  Aspirin Ec 325 Mg Tbec (Aspirin) .... Take one tablet by mouth daily 9)  Levitra 20 Mg Tabs (Vardenafil hcl) .... Uad  Patient Instructions: 1)  Please schedule a follow-up appointment in 1 year. Prescriptions: DIAZEPAM 5 MG  TABS (DIAZEPAM) once daily  #60 x 6   Entered and Authorized by:   Roderick Pee MD   Signed by:   Roderick Pee MD on 03/31/2010   Method used:   Print then Give to Patient   RxID:   [id number] ZOLPIDEM TARTRATE 10 MG  TABS (ZOLPIDEM TARTRATE) one by mouth q hs as needed  #60 x 6   Entered and Authorized by:   Roderick Pee MD   Signed by:   Roderick Pee MD on 03/31/2010   Method used:   Print then Give to Patient   RxID:   [id number] OMEPRAZOLE 10 MG CPDR (OMEPRAZOLE) 1 tab by mouth once daily  #100 x 3   Entered and Authorized by:   Roderick Pee MD   Signed by:   Roderick Pee MD on 03/31/2010   Method used:   Electronically to        [facility]* (retail)       [address] [place], [place]  [address]       Ph: [phone]       Fax: [phone]   RxID:   [id number] TENORETIC 50 50-25 MG TABS (ATENOLOL-CHLORTHALIDONE) 1/2 tablet by mouth once a day  #50 Each x 3   Entered and Authorized by:   Roderick Pee MD   Signed by:   Roderick Pee MD on 03/31/2010   Method used:   Electronically to        [facility]* (retail)       [address] [place], [place]  [address]       Ph: [phone]       Fax: [phone]   RxID:   [id number] ALLOPURINOL 300 MG TABS (ALLOPURINOL) once daily  #100 Each x 3   Entered and Authorized by:   Roderick Pee MD   Signed by:   Roderick Pee MD on 03/31/2010   Method used:   Electronically to        [facility]* (retail)       [address] [place], [place]  [address]       Ph: [phone]       Fax: [phone]   RxID:   [id number]

## 2010-09-27 NOTE — Letter (Signed)
Summary: [facility]: Immunoassay Fecal Occult Blood (iFOB) Order [facility]  [address] [place], [place] [address]   Phone: [phone]  Fax: [phone]      [facility]: Immunoassay Fecal Occult Blood (iFOB) Order Form   Jan 25, 2010 MRN: [id number]   [name] April 17, 1936   Physicican Name:  Jarold Motto  Diagnosis Code:  V12.72      [name] RN

## 2010-09-27 NOTE — Medication Information (Signed)
Summary: rov/jnh  Anticoagulant Therapy  Managed by: Weston Brass, PharmD Referring MD: Dr Olga Millers Supervising MD: Juanda Chance MD, Slate Debroux Indication 1: Atrial Fibrillation Lab Used: [facility] Site: [address] INR POC 2.5 INR RANGE 2.0-3.0  Dietary changes: no    Health status changes: no    Bleeding/hemorrhagic complications: no    Recent/future hospitalizations: no    Any changes in medication regimen? no    Recent/future dental: no  Any missed doses?: no       Is patient compliant with meds? yes       Allergies (verified): No Known Drug Allergies  Anticoagulation Management History:      The patient is taking warfarin and comes in today for a routine follow up visit.  Positive risk factors for bleeding include an age of 73 years or older.  The bleeding index is 'intermediate risk'.  Positive CHADS2 values include History of HTN.  Negative CHADS2 values include Age > 83 years old.  His last INR was 2.1.  Anticoagulation responsible provider: Juanda Chance MD, Smitty Cords.  INR POC: 2.5.  Cuvette Lot#: [id number].  Exp: 05/2010.    Anticoagulation Management Assessment/Plan:      The patient's current anticoagulation dose is Warfarin sodium 5 mg tabs: Use as directed by Anticoagulation Clinic.  The target INR is 2.0-3.0.  The next INR is due 07/01/2009.  Anticoagulation instructions were given to patient.  Results were reviewed/authorized by Weston Brass, PharmD.  He was notified by Marcheta Grammes, PharmD candidate.         Prior Anticoagulation Instructions: INR 2.8  Continue normal dosing schedule of 1/2 tablet on Mondays and Fridays, and 1 tablet on all other days.  Return to clinic in 1 week.  Current Anticoagulation Instructions: INR 2.5  Take half a tablet (2.5 mg) every Monday. Take 1 tablet (5 mg) all the other days of the week.

## 2010-09-27 NOTE — Letter (Signed)
Summary: Appointment - Reminder 2  [facility], Main Office  [address]   [place], [place] [address]   Phone: [phone]  Fax: [phone]     April 06, 2009 MRN: [id number]   [name] [address] [place], [place]  [address]   Dear [name],  Our records indicate that it is time to schedule a follow-up appointment.  Dr.Crenshaw  recommended that you follow up with [place] in Oct 2010 . It is very important that we reach you to schedule this appointment. We look forward to participating in your health care needs. Please contact us at the number listed above at your earliest convenience to schedule your appointment.  If you are unable to make an appointment at this time, give [place] a call so we can update our records.     Sincerely,  Lorne Skeens  [facility] Scheduling Team

## 2010-10-10 ENCOUNTER — Other Ambulatory Visit: Payer: Self-pay | Admitting: Family Medicine

## 2010-10-10 NOTE — Telephone Encounter (Signed)
Okay to refill medication until next physical exam

## 2010-10-10 NOTE — Telephone Encounter (Signed)
Okay to refill medications until next physical

## 2010-10-11 NOTE — Telephone Encounter (Signed)
rx called in

## 2010-11-10 LAB — BASIC METABOLIC PANEL
BUN: 14 mg/dL (ref 6–23)
CO2: 28 mEq/L (ref 19–32)
Calcium: 9 mg/dL (ref 8.4–10.5)
Chloride: 106 mEq/L (ref 96–112)
Creatinine, Ser: 0.87 mg/dL (ref 0.4–1.5)
GFR calc Af Amer: >60 mL/min (ref >60)
GFR calc non Af Amer: >60 mL/min (ref >60)
Glucose, Bld: 101 mg/dL — ABNORMAL HIGH (ref 70–99)
Potassium: 3.9 mEq/L (ref 3.5–5.1)
Sodium: 138 mEq/L (ref 135–145)

## 2010-11-10 LAB — POCT HEMOGLOBIN-HEMACUE: Hemoglobin: 13.4 g/dL (ref 13.0–17.0)

## 2010-11-30 LAB — BASIC METABOLIC PANEL
BUN: 14 mg/dL (ref 6–23)
CO2: 31 mEq/L (ref 19–32)
Calcium: 9.1 mg/dL (ref 8.4–10.5)
Chloride: 103 mEq/L (ref 96–112)
Creatinine, Ser: 0.92 mg/dL (ref 0.4–1.5)
GFR calc Af Amer: >60 mL/min (ref >60)
GFR calc non Af Amer: >60 mL/min (ref >60)
Glucose, Bld: 117 mg/dL — ABNORMAL HIGH (ref 70–99)
Potassium: 3.4 mEq/L — ABNORMAL LOW (ref 3.5–5.1)
Sodium: 140 mEq/L (ref 135–145)

## 2010-11-30 LAB — CBC
HCT: 39.4 % (ref 39.0–52.0)
Hemoglobin: 13.5 g/dL (ref 13.0–17.0)
MCHC: 34.2 g/dL (ref 30.0–36.0)
MCV: 97.9 fL (ref 78.0–100.0)
Platelets: 170 10*3/uL (ref 150–400)
RBC: 4.02 MIL/uL — ABNORMAL LOW (ref 4.22–5.81)
RDW: 14.1 % (ref 11.5–15.5)
WBC: 5.4 10*3/uL (ref 4.0–10.5)

## 2010-11-30 LAB — APTT: aPTT: 34 seconds (ref 24–37)

## 2010-11-30 LAB — PROTIME-INR
INR: 2.29 — ABNORMAL HIGH (ref 0.00–1.49)
Prothrombin Time: 25 seconds — ABNORMAL HIGH (ref 11.6–15.2)

## 2010-12-02 LAB — PROTIME-INR
INR: 1.9 — ABNORMAL HIGH (ref 0.00–1.49)
Prothrombin Time: 21.2 seconds — ABNORMAL HIGH (ref 11.6–15.2)

## 2010-12-02 LAB — MAGNESIUM: Magnesium: 2 mg/dL (ref 1.5–2.5)

## 2010-12-16 ENCOUNTER — Encounter: Payer: Self-pay | Admitting: *Deleted

## 2010-12-16 ENCOUNTER — Encounter: Payer: Self-pay | Admitting: Cardiology

## 2010-12-19 ENCOUNTER — Ambulatory Visit (INDEPENDENT_AMBULATORY_CARE_PROVIDER_SITE_OTHER): Payer: Medicare Other | Admitting: Cardiology

## 2010-12-19 ENCOUNTER — Encounter: Payer: Self-pay | Admitting: Cardiology

## 2010-12-19 VITALS — BP 150/78 | HR 80 | Resp 18 | Ht 69.0 in | Wt 171.0 lb

## 2010-12-19 DIAGNOSIS — I10 Essential (primary) hypertension: Secondary | ICD-10-CM

## 2010-12-19 DIAGNOSIS — I359 Nonrheumatic aortic valve disorder, unspecified: Secondary | ICD-10-CM

## 2010-12-19 DIAGNOSIS — Z01810 Encounter for preprocedural cardiovascular examination: Secondary | ICD-10-CM

## 2010-12-19 DIAGNOSIS — I4891 Unspecified atrial fibrillation: Secondary | ICD-10-CM

## 2010-12-19 NOTE — Patient Instructions (Signed)
Your physician recommends that you schedule a follow-up appointment in: ONE YEAR  Your physician recommends that you return for lab work in: TODAY

## 2010-12-19 NOTE — Progress Notes (Signed)
HPI:[name] is a very pleasant  gentleman who has a history of aortic valve replacement secondary to aortic stenosis with a pericardial tissue valve and thoracic aortic aneurysm repair in July 2006.  Preoperative cardiac catheterization revealed normal coronary arteries. His last  CTA was  performed in October of 2009. He has a history of atrial fibrillation and has had a previous DCCV. A TSH was normal. An echocardiogram performed in August of 2011 and revealed normal LV function, trivial aortic insufficiency and biatrial enlargement. Also note we previously performed carotid Dopplers due to bruits which revealed 0-39% bilateral stenosis. A monitor in August of 2011showed occasional 3.1 second pause. Tenoretic was discontinued and Norvasc added for his blood pressure. Atrial fibrillation is being treated with rate control and anticoagulation. I last saw him in Oct of 2011. Since then, he denies dyspnea on exertion, orthopnea, PND, pedal edema, palpitations, syncope or chest pain. There is no bleeding.  Current Outpatient Prescriptions  Medication Sig Dispense Refill  . allopurinol (ZYLOPRIM) 300 MG tablet Take 300 mg by mouth daily.        Marland Kitchen amLODipine (NORVASC) 5 MG tablet Take 5 mg by mouth daily.        . dabigatran (PRADAXA) 150 MG CAPS Take 150 mg by mouth every 12 (twelve) hours.        . diazepam (VALIUM) 5 MG tablet TAKE ONE TABLET BY MOUTH EVERY DAY  30 tablet  5  . naproxen sodium (ANAPROX) 220 MG tablet Take 220 mg by mouth 2 (two) times daily with a meal.        . zolpidem (AMBIEN) 10 MG tablet TAKE ONE TABLET BY MOUTH AT BEDTIME AS NEEDED  30 tablet  5  . DISCONTD: omeprazole (PRILOSEC) 10 MG capsule Take 10 mg by mouth daily.           Past Medical History  Diagnosis Date  . HTN (hypertension)   . Atrial fibrillation   . Aortic stenosis   . Erectile dysfunction   . Vertigo   . Gout   . GERD (gastroesophageal reflux disease)   . DJD (degenerative joint disease)   . MVP  (mitral valve prolapse)   . Hernia   . Baker cyst     Past Surgical History  Procedure Date  . Hernia repair   . Cartilage surgery   . Hernia repair   . Aortic valve replacement     History   Social History  . Marital Status: Single    Spouse Name: N/A    Number of Children: N/A  . Years of Education: N/A   Occupational History  . Not on file.   Social History Main Topics  . Smoking status: Former Games developer  . Smokeless tobacco: Not on file   Comment: quit appox 6 years ago  . Alcohol Use: Yes  . Drug Use: No  . Sexually Active: Not on file   Other Topics Concern  . Not on file   Social History Narrative  . No narrative on file    ROS: Arthritis and right hip butno fevers or chills, productive cough, hemoptysis, dysphasia, odynophagia, melena, hematochezia, dysuria, hematuria, rash, seizure activity, orthopnea, PND, pedal edema, claudication. Remaining systems are negative.  Physical Exam: Well-developed well-nourished in no acute distress.  Skin is warm and dry.  HEENT is normal.  Neck is supple. No thyromegaly.  Chest is clear to auscultation with normal expansion.  Cardiovascular exam is irregular; 2/6 systolic murmur left sternal border. No diastolic  murmur. Abdominal exam nontender or distended. No masses palpated. Extremities show no edema. neuro grossly intact  ECG Atrial fibrillation at a rate of 80. RV conduction delay. No ST changes.

## 2010-12-19 NOTE — Assessment & Plan Note (Signed)
Status post aortic valve replacement. Continued SBE prophylaxis.

## 2010-12-19 NOTE — Assessment & Plan Note (Signed)
Continue present blood pressure medications. 

## 2010-12-19 NOTE — Assessment & Plan Note (Signed)
Patient's rate is controlled on no medications. Continue pradaxa. Check CBC and BMET.

## 2010-12-19 NOTE — Assessment & Plan Note (Signed)
Previous cardiac catheterization revealed no coronary disease. No chest pain or shortness of breath. No further cardiac workup indicated. Discontinue pradaxa 3 days prior to procedure.

## 2010-12-20 LAB — CBC WITH DIFFERENTIAL/PLATELET
Basophils Absolute: 0 10*3/uL (ref 0.0–0.1)
Basophils Relative: 0.6 % (ref 0.0–3.0)
Eosinophils Absolute: 0.1 10*3/uL (ref 0.0–0.7)
Eosinophils Relative: 2.8 % (ref 0.0–5.0)
HCT: 36.4 % — ABNORMAL LOW (ref 39.0–52.0)
Hemoglobin: 12.3 g/dL — ABNORMAL LOW (ref 13.0–17.0)
Lymphocytes Relative: 21.9 % (ref 12.0–46.0)
Lymphs Abs: 1.1 10*3/uL (ref 0.7–4.0)
MCHC: 33.8 g/dL (ref 30.0–36.0)
MCV: 97.6 fl (ref 78.0–100.0)
Monocytes Absolute: 0.5 10*3/uL (ref 0.1–1.0)
Monocytes Relative: 10 % (ref 3.0–12.0)
Neutro Abs: 3.3 10*3/uL (ref 1.4–7.7)
Neutrophils Relative %: 64.7 % (ref 43.0–77.0)
Platelets: 156 10*3/uL (ref 150.0–400.0)
RBC: 3.73 Mil/uL — ABNORMAL LOW (ref 4.22–5.81)
RDW: 14.2 % (ref 11.5–14.6)
WBC: 5.1 10*3/uL (ref 4.5–10.5)

## 2010-12-20 LAB — BASIC METABOLIC PANEL
BUN: 19 mg/dL (ref 6–23)
CO2: 32 mEq/L (ref 19–32)
Calcium: 9 mg/dL (ref 8.4–10.5)
Chloride: 102 mEq/L (ref 96–112)
Creatinine, Ser: 0.9 mg/dL (ref 0.4–1.5)
GFR: 87.9 mL/min (ref >60.00)
Glucose, Bld: 79 mg/dL (ref 70–99)
Potassium: 3.8 mEq/L (ref 3.5–5.1)
Sodium: 140 mEq/L (ref 135–145)

## 2010-12-21 ENCOUNTER — Telehealth: Payer: Self-pay | Admitting: Cardiology

## 2010-12-21 DIAGNOSIS — Z79899 Other long term (current) drug therapy: Secondary | ICD-10-CM

## 2010-12-21 DIAGNOSIS — I4891 Unspecified atrial fibrillation: Secondary | ICD-10-CM

## 2010-12-21 NOTE — Telephone Encounter (Signed)
Message copied by Deliah Goody on Wed Dec 21, 2010  8:38 AM ------      Message from: Olga Millers      Created: Tue Dec 20, 2010 12:25 PM       Repeat cbc in 4 weeks

## 2010-12-21 NOTE — Telephone Encounter (Signed)
Pt returning your call

## 2010-12-21 NOTE — Telephone Encounter (Signed)
Pt aware of lab results, he will have repeat labs on may 25th [name]

## 2011-01-10 NOTE — Assessment & Plan Note (Signed)
[facility]                            CARDIOLOGY OFFICE NOTE   NAME:[name], [name]                      MRN:          [id number]  DATE:05/31/2007                            DOB:          02-19-1936    [name] is a very pleasant gentleman who is status post aortic valve  replacement secondary to aortic stenosis and thoracic aortic aneurysm  repair performed in July 2006.  His most recent echocardiogram was  performed on February 05, 2007.  At that time he had normal LV function.  There was a Carpentier-Edwards pericardial tissue valve.  There was mild  perivalvular aortic insufficiency.  There was mild aortic root  dilatation.  The left atrium was moderately dilated.  Since I last saw  him, he is doing extremely well.  He denies any dyspnea on exertion,  orthopnea, PND, pedal edema, palpitations, syncope, or chest pain.   MEDICATIONS:  1. Zegerid 40 mg p.o. daily.  2. Citalopram 20 mg p.o. daily.  3. Atenolol/chlorthalidone 50/12.5 mg daily.  4. Aspirin 81 mg p.o. daily.  5. Vitamin E.  6. Allopurinol 300 mg p.o. daily.   PHYSICAL EXAMINATION:  VITAL SIGNS:  Shows a blood pressure of 125/64  and his pulse is 57.  He weighs 175 pounds.  HEENT:  Normal.  NECK:  Supple.  CHEST:  Clear  CARDIOVASCULAR:  Regular rate and rhythm.  There is a 2/6 systolic  ejection murmur at the left sternal border.  ABDOMEN:  Benign.  EXTREMITIES:  Showed no edema.   Electrocardiogram shows a sinus rhythm at a rate of 54.  There were no  ST changes noted.  There is a 1st degree AV block.   DIAGNOSES:  1. Status post aortic valve replacement with a porcine valve and a      thoracic aortic aneurysm repair.      a.     The patient's recent echocardiogram showed a reasonably       functioning aortic valve.      b.     He understands subacute bacterial endocarditis prophylaxis.  2. History of mild prosthetic valve aortic stenosis - his mean      gradient on his  recent echocardiogram was 15-mmHg and stable.  3. Gastroesophageal reflux disease.  4. Hypertension.  His blood pressure is well controlled on his present      medications.  5. History of gout.   We will see him back in 12 months.     Jared Frieze Jens Som, MD, [facility]  Electronically Signed    BSC/MedQ  DD: 05/31/2007  DT: 05/31/2007  Job #: [id number]

## 2011-01-10 NOTE — Assessment & Plan Note (Signed)
[facility]                            CARDIOLOGY OFFICE NOTE   NAME:[name], [name]                      MRN:          [id number]  DATE:05/28/2008                            DOB:          16-Apr-1936    [name] is a very pleasant 75 year old gentleman who has a history  of aortic valve replacement secondary to aortic stenosis and thoracic  aortic aneurysm repair in July 2006.  Since I last saw him, he is doing  well from symptomatic standpoint.  There is no dyspnea, chest pain,  palpitations or syncope and there is no pedal edema.   MEDICATIONS:  1. Zegerid 40 mg p.o. daily.  2. Citalopram 20 mg p.o. daily.  3. Atenolol and chlorthalidone 50/25 mg tablets one-half p.o. daily.  4. Aspirin 81 mg p.o. daily.  5. Vitamin E.  6. Allopurinol.   PHYSICAL EXAMINATION:  VITAL SIGNS:  Blood pressure of 145/65 and his  pulse is 56.  He weighs 170 pounds.  HEENT:  Normal.  NECK:  Supple.  CHEST:  Clear.  CARDIOVASCULAR:  Regular rate and rhythm.  There is a 2/6 systolic  murmur at the left sternal border.  S2 is not diminished.  ABDOMEN:  No tenderness.  EXTREMITIES:  No edema.   His electrocardiogram shows a sinus rhythm with occasional PVCs.  The  axis is normal.  There is no RV conduction delay with there are no  significant ST changes noted.   DIAGNOSES:  1. History of aortic valve replacement with porcine valve as well as      thoracic aortic aneurysm repair - we will plan to repeat his      echocardiogram.  I will also schedule him to have a computed      tomographic angiography to evaluate his aortic root and abdominal      aorta.  If this looks stable then we will continue with medical      therapy.  We discussed subacute bacterial endocarditis prophylaxis.  2. History of mild prosthetic valve aortic stenosis - as per #1.  3. Gastric reflux disease.  4. Hypertension - his blood pressure is minimally elevated.  We will      track this and  adjust his medicines as indicated.  5. History of gout.   I will see him back in 12 months.     Madolyn Frieze Jens Som, MD, [facility]  Electronically Signed    BSC/MedQ  DD: 05/28/2008  DT: 05/28/2008  Job #: [id number]

## 2011-01-10 NOTE — Op Note (Signed)
[name], [name]               ACCOUNT NO.:  [id number]   MEDICAL RECORD NO.:  [id number]          PATIENT TYPE:  AMB   LOCATION:  DAY                          FACILITY:  [facility]   PHYSICIAN:  Georges Lynch. Gioffre, M.D.DATE OF BIRTH:  [birth year]   DATE OF PROCEDURE:  07/31/2008  DATE OF DISCHARGE:                               OPERATIVE REPORT   SURGEON:  Dr. Darrelyn Hillock   ASSISTANT:  Arlyn Leak, P.A.   PREOP DIAGNOSES:  1. Complete complex retracted tear of the rotator cuff tendon on the      left.  2. Longitudinal tear of the biceps tendon on the left.  3. Complete subluxation of the long head of the biceps tendon on the      left.   POSTOP DIAGNOSES:  1. Complete complex retracted tear of the rotator cuff tendon on the      left.  2. Longitudinal tear of the biceps tendon on the left.  3. Complete subluxation of the long head of the biceps tendon on the      left.   OPERATIONS:  1. Open acromionectomy of the left shoulder.  2. Tenodesis of the long head of the biceps tendon, left shoulder.  3. Primary repair of the longitudinal tear of the long head of the      biceps tendon.  4. Repair of a complex retracted tear of the rotator cuff tendon left      shoulder.  5. Tissue mend graft for the repair of the rotator cuff tendon, left      shoulder.  We utilized 3 peak cuff the PEEK anchors.   PROCEDURE:  Under general anesthesia, routine orthopedic prep and drape  of the left shoulder was carried out.  Prior to general anesthetic, the  anesthesiologist did an interscalene nerve block.  An incision was made  over the anterior aspect of the left shoulder.  Bleeders were identified  and cauterized.  At this particular time, I detached the deltoid tendon  by sharp dissection from the acromion.  I then noted a severe down slope  and thickened acromion.  I protected the underlying rotator cuff with a  Bennett retractor.  I did a partial acromionectomy of the oscillating  saw and an  acromioplasty with the bur.  Once we reestablished the  subacromial space, we noted that the long head of the biceps was  completely subluxed out of the groove.  About a third of the long head  of the biceps was also was split longitudinally and detached proximally.   We first utilized a bur to bur down the lateral articular surface of the  humerus.  We thoroughly irrigated out the area.  We took whatever pieces  we could of the rotator cuff and sutured those down to the bare bone  that we created with the bur.  We utilized 3 PEEK anchors to do this.  Once we were able to piece the rotator cuff back, we also noted that it  was significantly degenerated.  We first, prior to doing this, tenodesed  the biceps tendon with an anchor and  then tenodesed it into the groove.  I then used the proximal part of the biceps tendon as a reinforcement  since we had a marked gap between the ends of the tendon which were  severely degenerated.  So, we did have to use a portion of that long  head of the biceps for our repair.  Following this, we then used a  TissueMend graft, 2 x 2, to reinforce the repair site.   We thoroughly irrigated out the area.  I inserted some thrombin-soaked  Gelfoam up in the subacromial space.  I reapproximated deltoid tendon  and muscle in the usual fashion.  The subcu was closed with Vicryl, skin  with metal staples.  We then applied a sling with a DonJoy pillow.   NOTE:  This was a very difficult procedure.  The tendon was severely  degenerated.  We did not have good tendon to repair, but we did our best  with what we had in this case.           ______________________________  Georges Lynch. Darrelyn Hillock, M.D.     RAG/MEDQ  D:  07/31/2008  T:  07/31/2008  Job:  [id number]

## 2011-01-13 NOTE — Assessment & Plan Note (Signed)
[facility]                              CARDIOLOGY OFFICE NOTE   NAME:[name], [name]                      MRN:          [id number]  DATE:06/22/2006                            DOB:          February 09, 1936    [name] is a pleasant gentleman who has a history of aortic valve  replacement secondary to aortic stenosis and thoracic aortic aneurysm  repair. Since I last saw him, he is doing well with no dyspnea, chest pain,  palpitations or syncope. There is no pedal edema.   MEDICATIONS:  1. Zegerid 40 mg p.o. daily.  2. Citalopram 20 mg p.o. daily.  3. Atenolol/chlorthalidone 50/25 mg tablets 1/2 p.o. daily.  4. Aspirin 81 mg p.o. daily.  5. Vitamin E.   PHYSICAL EXAMINATION:  VITAL SIGNS:  Blood pressure 122/78 and his is pulse  50. He weighs 190 pounds.  NECK:  Supple.  CHEST:  Clear.  CARDIOVASCULAR:  Reveals a regular rate and rhythm. There is a 2-3/6  systolic murmur in the left sternal border. I cannot appreciate a diastolic  murmur.  ABDOMEN:  Shows no pulsatile masses and no bruits.  EXTREMITIES:  Show no edema.   His electrocardiogram shows a sinus bradycardia at a rate of 50. There are  occasional PACs. There are nonspecific ST changes.   DIAGNOSES:  1. Status post aortic valve replacement with a Porcine valve and thoracic      aortic aneurysm repair.  2. Mild prostatic valve aortic stenosis.  3. Gastroesophageal reflux disease.  4. Hypertension.  5. History of gout.   [name] is doing extremely well from a symptomatic standpoint. We will  plan to repeat his echocardiogram as he has had mild prostatic valve aortic  stenosis previously. We will otherwise continue with risk factor  modification. He understands SBE prophylaxis. I will see him back in 12  months.    ______________________________  Madolyn Frieze. Jens Som, MD, [facility]    BSC/MedQ  DD: 06/22/2006  DT: 06/23/2006  Job #: [id number]

## 2011-01-13 NOTE — H&P (Signed)
[name], [name]               ACCOUNT NO.:  [id number]   MEDICAL RECORD NO.:  [id number]          PATIENT TYPE:  INP   LOCATION:                               FACILITY:  [facility]   PHYSICIAN:  Salvatore Decent. Cornelius Moras, M.D. DATE OF BIRTH:  [birth year]   DATE OF ADMISSION:  03/21/2005  DATE OF DISCHARGE:                                HISTORY & PHYSICAL   CHIEF COMPLAINT:  Heart murmur.   HISTORY OF PRESENT ILLNESS:  The patient is a 75 year old Hispanic male who  was found on recent physical examination by his primary care physician to  have new cardiac murmur.  He underwent a 2-D echocardiogram which revealed  severe aortic stenosis with normal left ventricular systolic function and  mild mitral regurgitation.  Because of these findings he was referred to Dr.  Tressie Francis for surgical recommendations.  Dr. Cornelius Moras evaluated the patient  and subsequently referred him to Dr. Olga Francis for cardiac  catheterization.  This was performed on July14 and findings were notable for  the presence of normal coronary arteries with insignificant coronary artery  disease and normal PA pressures.  The aortic valve was not crossed and the  aortic gradient was not measured.  He also underwent a CT angiogram of the  ascending aorta which revealed significant aneurysmal dilatation of the  ascending thoracic aorta with a maximum transverse diameter of the aorta  approximating 5 cm.  He returned recently to follow up with Dr. Cornelius Moras and  based on the findings of a bicuspid aortic valve with severe aortic stenosis  and aneurysmal dilatation of the ascending thoracic aorta Dr. Cornelius Moras has  recommended proceeding with aortic valve replacement and aortic root  replacement at this time.  Of note, the patient has been completely  asymptomatic, specifically denying shortness of breath or chest pain.  He  presents at this time for preoperative history and physical.   PAST MEDICAL HISTORY:  1.  History of mitral valve  prolapse.  2.  Gout.  3.  Degenerative joint disease.   PAST SURGICAL HISTORY:  1.  Bilateral inguinal hernia repair in 1978.  2.  Removal of benign vocal cord polyp in 1997.  3.  Left knee surgery to remove a cyst in 1990.   CURRENT MEDICATIONS:  1.  Allopurinol 300 mg daily.  2.  Vitamin E 400 international units daily.  3.  Aleve one tablet daily.  4.  Enteric-coated aspirin 325 mg daily last taken four to five days ago.   ALLERGIES:  No known drug allergies.   SOCIAL HISTORY:  He is married and resides in [place] with his wife.  They  have one child.  He smokes a pack of cigarettes per day and has smoked for  30+ years.  He states that yesterday was the last day that he has smoked and  he plans to remain quit following his surgery.  He drinks alcohol on a  social basis.  He is a retired Scientist, product/process development.   FAMILY HISTORY:  His mother has diabetes mellitus.  His father died of an  abdominal aortic  aneurysm.  He has one brother and two sisters who are alive  and well with no chronic medical problems.   REVIEW OF SYSTEMS:  See history of present illness for pertinent positives  and negatives.  Also, he has arthritis in both knees and in his right  shoulder for which he takes Aleve regularly.  He wears glasses.  He denies  fevers, chills, recent infections, weight loss, visual changes, amaurosis  fugax, syncope, TIA symptoms, dysphagia, dysarthria, chest pain,  palpitations, shortness of breath, dyspnea on exertion, paroxysmal nocturnal  dyspnea, cough, wheezing, hemoptysis, hematemesis, abdominal pain, nausea,  vomiting, diarrhea, constipation, hematochezia, melena, hematuria, dysuria,  nocturia, lower extremity claudication symptoms, rest pain, lower extremity  edema, nonhealing lower extremity ulcers, intolerance to heat or cold,  muscle or joint problems, anxiety or depression.   PHYSICAL EXAMINATION:  VITAL SIGNS:  Blood pressure is 114/70, heart rate 64  and regular,  respirations 18 unlabored.  GENERAL:  This is a well-developed, well-nourished Hispanic male in no acute  distress.  He appears slightly younger than his stated age.  HEENT:  Normocephalic, atraumatic.  Pupils equal, round, react to light accommodation.  Extraocular movements  intact.  TMs and canals are clear.  Nares patent bilaterally.  Oropharynx is  clear with moist mucous membranes.  NECK:  Supple without lymphadenopathy, thyromegaly, or carotid bruits.  HEART:  Regular rate and rhythm with a 4/6 systolic murmur heard best at the  upper sternal borders.  ABDOMEN:  Soft, nontender, nondistended with active bowel sounds in all  quadrants.  No masses or hepatosplenomegaly.  EXTREMITIES:  No clubbing, cyanosis, or edema.  He has 2+ femoral, dorsalis  pedis, and posterior tibial pulses bilaterally.  NEUROLOGIC:  Cranial nerves II-XII grossly intact.  He is alert and oriented  x3.  His gait is within normal limits.  Symmetrical muscle strength in the  upper and lower extremities.   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  He will be admitted  to [facility] on March 21, 2005 and undergo aortic valve and aortic  root replacement by Dr. Tressie Francis.  The patient desires to avoid the  need for long-term anticoagulation with coumadin and specifically requests  that a bioprosthetic valve or cryopreserved human allograft aortic valve be  utilized for valve replacement.  He understands the potential associated  risk for late valve degeneration and failure.  All questions have been  addressed.       GC/MEDQ  D:  03/17/2005  T:  03/17/2005  Job:  [id number]   cc:   Tinnie Gens A. Tawanna Cooler, M.D. [facility]   [name], M.D. [facility]

## 2011-01-13 NOTE — Discharge Summary (Signed)
[name], [name]               ACCOUNT NO.:  [id number]   MEDICAL RECORD NO.:  [id number]          PATIENT TYPE:  INP   LOCATION:  2039                         FACILITY:  [facility]   PHYSICIAN:  Salvatore Decent. Cornelius Moras, M.D. DATE OF BIRTH:  02/11/1936   DATE OF ADMISSION:  03/21/2005  DATE OF DISCHARGE:  03/27/2005                                 DISCHARGE SUMMARY   PRIMARY ADMITTING DIAGNOSIS:  Heart murmur.   DISCHARGE DIAGNOSES:  1.  Severe aortic stenosis with bicuspid aortic valve.  2.  Ascending thoracic aortic aneurysm.  3.  Postoperative atrial fibrillation.  4.  Postoperative anemia.  5.  History of mitral valve prolapse.  6.  Gout.  7.  Degenerative joint disease.   PROCEDURES PERFORMED:  1.  Resection and grafting of a ascending thoracic aortic aneurysm.  2.  Homograft aortic root replacement.  3.  Replacement of homograft aortic valve with Edwards pericardial tissue      bioprosthesis (21 mm Magnum series bovine pericardial valve).   HISTORY:  The patient is a 75 year old Hispanic male. He was found on recent  physical exam by primary care physician to have a new cardiac murmur. He  underwent a 2-D echocardiogram which revealed severe aortic stenosis with  normal left ventricular systolic function and mild mitral regurgitation.  Because of these findings, he was referred to Dr. Tressie Stalker for surgical  evaluation. He ultimately underwent cardiac catheterization by Dr. Olga Millers on [phone] and found to have normal coronary arteries with normal PA  pressures. The aortic valve was not crossed and the aortic gradient was not  measured. He also underwent a CT angiogram of the ascending aorta which  revealed significant aneurysmal dilatation of the ascending thoracic aortic  with a maximum transverse diameter of the aorta approximating 5 cm. He  returned recently for follow up with Dr. Cornelius Moras and based on his CT and  cardiac cath findings as well as his 2-D  echocardiogram, Dr. Cornelius Moras  recommended proceeding with aortic root replacement and aortic valve  replacement at this time. The patient specifically desired to avoid  anticoagulation with Coumadin and requested a bioprosthetic valve or  homograft aortic valve for replacement. Dr. Cornelius Moras discussed the risks,  benefits and alternative of the procedure as well as his graft options and  the patient did agree to proceed.   HOSPITAL COURSE:  Mr. Zietlow was admitted on March 21, 2005. He was taken to  the operating room where underwent the above-described procedures.  Intraoperatively, initially an aortic root replacement with homograft was  performed. However, there was 1 to 2+ aortic insufficiency due to a mild  prolapse of the left cusp on intraoperative TEE. Therefore the homograft was  removed. The homograft was replaced with a 21 mm Edwards magnum pericardial  valve. The patient tolerated procedure well and was transferred to the SICU  in stable condition. He was able to be extubated shortly after surgery. He  was hemodynamically stable and doing well on postoperative day one. At that  time, he had chest tube and hemodynamic monitoring devices were removed. He  was slowly mobilized and started on diuretic. He is kept in the ICU for  further observation. By postoperative day two, he was ready for transfer to  the floor. He was started on beta blocker as well as short-term Coumadin.  Postoperatively, he had a brief episode of atrial fibrillation and was  started on an amiodarone drip. He did convert to normal sinus rhythm and  since that time has had no further arrhythmias. After 24 hours on amiodarone  drip, he was switched to a p.o. dose. He has overall done very well  postoperatively. His rhythm has remained stable since his initial bout of  atrial fibrillation. He has been ambulating in the halls without difficulty.  He is tolerating regular diet and is having normal bowel and bladder   function. His surgical incision sites are all healing well. He has been  mildly anemic with his most recent hemoglobin and hematocrit of 8.5 and 25.4  respectively. He has since been started on iron replacement and  supplementation and will continue post discharge. He is still somewhat  volume overloaded and is about five pounds above his preoperative weight  with mild lower extremity edema on physical exam. He is diuresing well. He  has been anticoagulated and his PT is 21.7, INR 1.9 on the day of discharge.  The remainder of his labs showed a white count 7.9, platelet count 214,000,  sodium 135, potassium 3.9, BUN 11, creatinine 1.0. It is felt at this time  since he has remained stable and doing well may be discharged home with  close outpatient follow-up of his INR in the next 72 hours.   DISCHARGE MEDICATIONS:  1.  Coumadin 2.5 milligrams alternating with 5 milligrams every other day      until his blood work is checked.  2.  Amiodarone 200 milligrams daily.  3.  Allopurinol 300 milligrams daily.  4.  Nu-Iron 150 mg b.i.d.  5.  Lopressor 25 milligrams b.i.d.  6.  Tylox 1 to 2 q.4h. p.r.n. for pain.   DISCHARGE INSTRUCTIONS:  He is asked to refrain from driving, heavy lifting  or strenuous activity. He may continue ambulating daily and using his  incentive spirometer. He may shower daily and clean his incisions with soap  and water. He will continue low-fat, low-sodium diet.   DISCHARGE FOLLOWUP:  He will see Dr. Joseph Berkshire. on August21, 2006 at  1:45 p.m. He will have chest x-ray at this visit. He will follow up with Dr.  Cornelius Moras in three weeks and our office will contact him an appointment. He will  have a PT and INR drawn at [facility] on March 29, 2005. He  will be contacted with results and instructions on any changes in his  anticoagulation. If he experiences any problems or has any questions, he is asked to contact our office immediately.        GC/MEDQ  D:  03/27/2005  T:  03/27/2005  Job:  [id number]   cc:   Olga Millers, M.D. [facility] A. Tawanna Cooler, M.D. [facility]

## 2011-01-13 NOTE — Op Note (Signed)
NAMEKAYCEON, OKI               ACCOUNT NO.:  [id number]   MEDICAL RECORD NO.:  [id number]          PATIENT TYPE:  INP   LOCATION:  2316                         FACILITY:  [facility]   PHYSICIAN:  Salvatore Decent. Cornelius Moras, M.D. DATE OF BIRTH:  08-13-1936   DATE OF PROCEDURE:  03/21/2005  DATE OF DISCHARGE:                                 OPERATIVE REPORT   PREOPERATIVE DIAGNOSES:  1.  Severe aortic stenosis.  2.  Ascending thoracic aortic aneurysm.   POSTOPERATIVE DIAGNOSES:  1.  Severe aortic stenosis.  2.  Ascending thoracic aortic aneurysm.   PROCEDURE:  Median sternotomy for homograft aortic root replacement,  resection and grafting of ascending thoracic aortic aneurysm, replacement of  homograft aortic valve with Edwards pericardial tissue bioprosthesis (21-mm  Magnum series bovine pericardial valve).   SURGEON:  Purcell Nails, MD   ASSISTANT:  Ms. Shonna Chock, PA   ANESTHESIA:  General.   BRIEF CLINICAL NOTE:  The patient is a 75 year old male found to have a  heart murmur on routine physical exam.  Echocardiogram revealed severe  aortic stenosis with normal left ventricular systolic function and moderate  left ventricular hypertrophy.  CT angiogram of the aorta was performed  demonstrating aneurysmal dilatation of the ascending thoracic aorta greater  than 5 cm in transverse diameter.  The patient underwent left and right  heart catheterization demonstrating normal coronary arteries with no  significant coronary artery disease.  The patient now presents for elective  aortic valve replacement with resection and grafting of the ascending  thoracic aortic aneurysm.   OPERATIVE CONSENT:  The patient and his wife have been counseled at length  regarding the indications and potential benefits of surgical intervention  for definitive replacement of his aortic valve and resection and grafting of  the ascending thoracic aortic aneurysm.  Alternative treatment strategies  have  been discussed and in particular, long-term risks of continued medical  therapy versus proceeding with surgery at this juncture have been discussed  at length.  Alternatives at the time of surgery have also been reviewed and  in particular, discussions have been entertained regarding whether or not to  use a mechanical prosthesis for replacement of the aortic valve versus use  of some type of bioprosthesis or human allograft valve.  The relative risks  and benefits have been reviewed at length.  After considerable discussion,  the patient emphatically request that some type of tissue valve be utilized  in an effort to avoid the need for lifelong anticoagulation with Coumadin.  He understands the possibility of late valve degeneration and failure as a  secondary complication of use of a bioprosthetic valve.  He also  specifically understands and accepts all associated risks of surgery  including, but not limited to, risk of death, stroke, myocardial infarction,  congestive heart failure, respiratory failure, pneumonia, bleeding requiring  blood transfusion, arrhythmia, infection, heart block or bradycardia  requiring permanent pacemaker, late complications related to the valve  replacement.  All of his questions have been addressed.   OPERATIVE NOTE IN DETAIL:  The patient is brought to the operating room  on  the above-mentioned date and central monitoring is established by the  anesthesia service under the care and direction of Dr. Adonis Huguenin.  Specifically, a Swan-Ganz catheter is placed through the right internal  jugular approach.  A radial arterial line is placed.  Intravenous  antibiotics are administered.  Following induction with general endotracheal  anesthesia, a Foley catheter is placed.  The patient's chest, abdomen, both  groins, and both lower extremities are prepared and draped in a sterile  manner.   Baseline transesophageal echocardiogram  is performed by Dr. Krista Blue.   This  confirms the presence of a bicuspid native aortic valve with severe aortic  stenosis.  There is only mild aortic insufficiency.  There is normal left  ventricular systolic function with moderate left ventricular hypertrophy.  The mitral valve appears normal.  There is severe aneurysmal dilatation of  the ascending thoracic aorta.  This begins immediately after the aortic  valve and involves the sinuses of Valsalva as well as is sinotubular  junction which cannot even really be identified.  No other significant  abnormalities are noted.   A median sternotomy incision is performed.  The pericardium is opened.  The  ascending aorta is severely dilated, greater than 5 cm in diameter.  The  aorta tapers down to less than 3 cm in diameter at the level of the  innominate artery and the transverse arch appears essentially normal.  The  aneurysmal dilatation of the aorta involves the entire aortic root.   The patient is heparinized systemically.  The transverse aortic arch is  cannulated between the innominate artery and the left carotid artery.  A  venous cannula is placed through the tip of the right atrial appendage.  A  retrograde cardioplegic catheter is placed through the right atrium into the  coronary sinus.  Adequate heparinization is verified.  Cardiopulmonary  bypass is begun and the surface of the heart is inspected.  There is  moderate left ventricular hypertrophy.  No other significant abnormalities  are noted.  The coronary arteries appear normal and there is no significant  plaque appreciated on the patient's visual inspection or palpation.  A left  ventricular vent is placed through the right superior pulmonary vein.  An  antegrade cardioplegic catheter is placed in the midportion of the ascending  thoracic aorta.   The patient is cooled to 28 degrees systemic temperature.  The aortic  crossclamp  is applied immediately before the takeoff of the innominate artery.   Antegrade cardioplegia is administered initially and ice saline  slush is applied for topical hypothermia.  The initial cardioplegic arrest  and myocardial cooling are felt to be excellent.  Supplemental cardioplegia  is administered retrograde through the coronary sinus catheter.  Repeat  doses of cardioplegia are administered intermittently throughout the  crossclamp portion of the operation retrograde through the coronary sinus  catheter to maintain left ventricular septal temperature below 15 degrees  centigrade.   The ascending aortic aneurysm is transected in its midportion.  The aortic  valve is inspected.  The aortic valve is congenitally bicuspid and severely  stenotic with heavy fibrosis and calcification of both leaflets of the  valve.  The valve itself was quite large.  The left main and the right  coronary arteries are in their normal anatomical position.  The native  aortic valve is excised sharply.  There is severe calcification in the  aortic annulus.  Decalcification is performed with sharp dissection.  Care  is taken  to capture any particulate debris during the debridement and after  completion of the debridement, the entire aortic root is irrigated with  copious iced saline solution.  The left main coronary artery and the right  coronary artery are now each mobilized as buttons off of the native aortic  wall from the aneurysmally dilated sinuses of Valsalva.  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  After each of the 2  coronary arteries are carefully mobilized, the remainder of the aortic root  is sharply debrided.  The aortic annulus is sized and will accept a 23-mm  aortic valve sizer easily in the root.  A human allograft cryopreserved  aortic valve and aortic root are thawed and prepared for implantation per  the manufacturer's recommendations (LifeNet  cryopreserved aortic valve and  root, HVAL-251, graft ID #[id number]).  The proximal suture line of the  aortic root replacement is performed using interrupted simple 4-0 Ethibond  sutures placed circumferentially around the aortic root with care to remain  in the plane at the level of the lowest portion of the annulus in the middle  of each cusp of the aortic annulus.  The allograft aortic root is trimmed  appropriately and then each of these sutures were secured, lowering the  valve into place.  Of note, after the tissues have been sharply trimmed from  the allograft, the allograft is carefully inspected for integrity of all 3  valve leaflets.  The proximal suture line is then reinforced with BioGlue  after all the sutures have been tied.   An appropriate location for reimplantation of the left main coronary artery  is now chosen along the left sinus of Valsalva of the allograft aortic root.  Of note, this site is slightly above the ligated left main coronary ostium of the allograft aortic route due to the long left main coronary button  associated with the patient's native aortic aneurysm.  The left main  coronary artery is now reattached to the allograft aortic root with running  6-0 Prolene suture.  Similarly, the right main coronary artery is  reimplanted after trimming an appropriate hole in the right sinus of  Valsalva of the allograft.  This  is also performed with running 6-0 Prolene  suture.  Each of these suture lines are reinforced with BioGlue.  The distal  length of the allograft aortic root is not long enough to replace the entire  aneurysmal ascending thoracic aorta.  The ascending thoracic aorta is  trimmed back to just below the level of the crossclamp.  In the  interposition Hemashield double Velour vascular graft (catalog number  [id number]) is chosen and initially this Hemashield graft is sewn in an end-to-  end fashion to the distal ascending thoracic aorta with a  running 4-0  Prolene suture.  The Hemashield graft is then beveled appropriately and  trimmed to an appropriate length and sewn end-to-end to the end of the  allograft aortic root after trimming the allograft aortic root to the level  just above the sinuses of Valsalva.  This is also performed with running 4-0  Prolene suture.  Each of the suture lines are reinforced with BioGlue.  A  Magoon needle was placed directly in the ascending thoracic aortic  Hemashield graft for purpose of evacuating any residual air from the aortic  root.  The patient is placed in steep  Trendelenburg position.  One final  dose of warm retrograde hot-shot cardioplegia is administered.  The lungs  are filled and the heart filled to facilitate evacuation of any residual air  from the left ventricular chamber and the aortic root.  The aortic  crossclamp is removed after a crossclamp time of 159 minutes.   The heart begins to beat spontaneously.  All suture lines were carefully  inspected for hemostasis.  Epicardial pacing wires are affixed to the right  ventricular outflow tract and to the right atrial appendage.  The coronary  sinus catheter is removed.  The left ventricular vent is removed.  A-V  sequential pacing is begun.  The patient is rewarmed to 37 degrees  centigrade temperature.   The patient is weaned from cardiopulmonary bypass without difficulty.  The  patient's rhythm at separation from bypass is a slow junctional escape  rhythm.  The A-V sequential pacing is employed.  No inotropic support is  required.  Total cardiopulmonary bypass time is 199 minutes.   Followup transesophageal echocardiogram is performed by Dr. Krista Blue.  This  demonstrates normal left ventricular systolic function.  There appears to be  mild or perhaps moderate aortic insufficiency involving the new homograft  aortic valve.  All 3 leaflets of the valve appear to open and close  normally, and there is no sign of leaflet  perforation.  Nevertheless, there appears to be an usual jet of aortic insufficiency.  The severity is  certainly mild (1+) and at times perhaps slightly more than that.  It does  not fill the entire left ventricular chamber and in fact, does not extend  more than 50% across the left ventricular chamber.  Nonetheless, there is  significant aortic insufficiency.   Cardiopulmonary bypass is reinstituted after ascertaining adequate  heparinization.  A retrograde cardioplegic catheter is replaced.  Left  ventricular vent is replaced.  An antegrade cardioplegic catheter is placed  directly in the Hemashield aortic graft.  The aortic crossclamp was placed  across the Hemashield graft and cardioplegia is delivered initially in an  antegrade fashion through the aortic root.  Iced saline slush is again  applied for topical hypothermia.  Supplemental cardioplegia is administered  retrograde through the coronary sinus catheter.   An oblique aortotomy incision is performed in the homograft aortic root  above the level of the coronary arteries and just proximal to the suture  line between the homograft aortic root and the Hemashield aortic graft.  The  homograft aortic valve is now carefully inspected.  All 3 leaflets appear  intact and there is no sign of leaflet perforation or injury.  However,  there is suggestion of prolapse of the left cusp of the homograft aortic  valve.  Associated with this prolapse appears to be a mild amount of aortic  insufficiency.  Under the circumstances,  replacement of this valve is felt  to be indicated in an effort to decrease the likelihood of late valve  degeneration and failure.  The allograft aortic valve leaflets are now  excised sharply with care to avoid any injury to the aortic root itself.  This valve is now replaced using a bioprosthetic tissue valve [employer] [place]-  Edwards Magnum series bovine pericardial valve, model number 3000, serial  number [id number]).   The valve replacement is performed using interrupted 2-0  Ethibond horizontal mattress pledgeted sutures with pledgets in the  subannular position.  The aortic bioprosthesis is secured in place  uneventfully and seats uneventfully.  A 21-mm valve  is chosen and seats  easily without risk of damaging the new aortic root.  Rewarming is  reinstituted.  The aortotomy is closed using a two-layer closure of running  4-0 Prolene suture.  The patient is again placed in Trendelenburg position.  The lungs are ventilated and the heart allowed to fill while one final dose  of warm retrograde hot-shot cardioplegia is administered.  All air is  evacuated through the aortic root.  The aortic crossclamp is removed after a  crossclamp time of 47 minutes, such that the entire crossclamp time for both  procedures is 206 minutes.   The heart begins to beat spontaneously without a need for cardioversion.  The retrograde cardioplegic catheter is removed.  All suture lines are again inspected for hemostasis.  The patient is rewarmed to 37 degrees centigrade  temperature.  The left ventricular vent is removed.  The patient is weaned  from cardiopulmonary bypass without difficulty.  The patient's rhythm at  separation from bypass is a second-degree A-V block.  A-V sequential pacing  is employed.  The second-degree block resolves and atrial pacing is  commenced prior to completion of the operation.  The entire cardiopulmonary  bypass time for the second pump run is 73 minutes such that total  cardiopulmonary bypass time for the entire operation is 272 minutes.   Followup transesophageal echocardiogram performed by Dr. Krista Blue demonstrates  normal left ventricular systolic function.  There is a well-seated aortic  valve prosthesis.  There is no sign of perivalvular leak or any significant  aortic insufficiency.  No other significant abnormalities are identified.  There is insignificant residual air.   The venous  and arterial cannulae and the aortic root vent are all removed.  Protamine is administered to reverse the anticoagulation.  Mediastinum is  irrigated with saline solution containing vancomycin.  Meticulous surgical  hemostasis is ascertained.  The mediastinum  is drained using 3 chest tubes  exited through separate stab incisions inferiorly.  The pericardium is  reapproximated loosely over the ascending aortic graft.  The median  sternotomy is closed with double-strength sternal wires.  The soft tissues  anterior to the sternum are closed in multiple layers and the skin is closed  with a running subcuticular skin closure.   The patient tolerated the procedure well and is transported to the surgical  intensive care unit in stable condition.  There were no intraoperative  implications.  All sponge, instrument and needle counts are verified correct  at completion of the operation.  No blood products were administered.       CHO/MEDQ  D:  03/21/2005  T:  03/22/2005  Job:  [id number]   cc:   Olga Millers, M.D. [facility] A. Tawanna Cooler, M.D. [facility]

## 2011-01-13 NOTE — Op Note (Signed)
NAMESALMAN, WELLEN               ACCOUNT NO.:  [id number]   MEDICAL RECORD NO.:  [id number]          PATIENT TYPE:  INP   LOCATION:  2316                         FACILITY:  [facility]   PHYSICIAN:  Quita Skye. Krista Blue, M.D.  DATE OF BIRTH:  12-13-1935   DATE OF PROCEDURE:  03/21/2005  DATE OF DISCHARGE:                                 OPERATIVE REPORT   DIAGNOSIS:  Ascending aortic aneurysm.   [name] is a 75 year old male who presents to the operating room  with an aneurysm of the ascending aorta.  He is scheduled to undergo aortic  root replacement and Dr. Cornelius Moras requested transesophageal echocardiogram for  interoperative management of the patient.  Overall images of the heart  showed no evidence of pericardial effusion or tamponade.  The right atrium  was somewhat obscured by the aortic aneurysm but the intra-atrial septum  appeared to be intact without evidence of defect.  The tricuspid valve had  trace regurgitation with the pulmonary artery catheter passing across the  valve.  The valve structure appeared normal.  The right ventricle had good  contractility with no evidence of masses or thrombus.  The left atrium was  normal in size.  There was no evidence of thrombus or masses in the atrium  or atrial appendage.  The mitral valve had thickened leaflets but there was  minimal or trace regurgitation with normal appearing pulmonary venous flows,  S-wave of 24, and V-wave of 32.  There was no evidence of reversible flow.  The mitral valve had normal E and A wave with E wave height of 66 and A wave  height of 63.  The left ventricle had concentric hypertrophy measuring  approximately 2.1 cm.  There was no evidence of regional wall motion  abnormalities.  The aortic valve was heavily calcified with a stenotic  appearing opening, although it was impossible for me to Doppler across the  valve.  The annulus of the valve measured at 2 cm and the sinotubular  junction demonstrated a  distance of 3.6 cm.  The aorta then acutely widened  and appeared to be approximately 4.5 cm in diameter.  The descending  thoracic aorta just leading from the arch had a diameter of 3 cm and  appeared to be normal.  There was no evidence of an intimal flap or a  dissection.  There was some mild atherosclerotic disease of the descending  thoracic aorta.  The patient then underwent aortic root replacement and was  separated from the cardiopulmonary machine.  The patient was evaluated and  demonstrated some dysfunction of the left leaflet of the aortic valve.  There was mild to moderate aortic regurgitation and this was felt to be  requiring further surgery so the patient then went on bypass.  An aortic  valve replacement was done.  The prosthetic valve was then evaluated prior  to separation from the cardiopulmonary bypass machine.  The valve function  was normal with no evidence of stenosis or regurgitation.  The patient  successfully separated and did well during his postoperative course.  The  transesophageal probe  was carefully removed and the patient was placed in  the SICU and intubated in good condition.       JDS/MEDQ  D:  03/21/2005  T:  03/21/2005  Job:  [id number]

## 2011-01-13 NOTE — Cardiovascular Report (Signed)
NAMEAIDAN, MOTEN               ACCOUNT NO.:  [id number]   MEDICAL RECORD NO.:  [id number]          PATIENT TYPE:  OIB   LOCATION:  6501                         FACILITY:  [facility]   PHYSICIAN:  Rollene Rotunda, M.D.   DATE OF BIRTH:  04-09-1936   DATE OF PROCEDURE:  03/10/2005  DATE OF DISCHARGE:                              CARDIAC CATHETERIZATION   PRIMARY CARE PHYSICIAN:  Tinnie Gens A. Tawanna Cooler, M.D.   PRIMARY CARDIOLOGIST:  Olga Millers, M.D.   PROCEDURE:  Coronary angiography and right heart catheterization.   PROCEDURE NOTE:  Coronary angiography was performed via the right femoral  artery; right heart catheterization performed via the right femoral vein.  Both vessels were cannulated using an anterior wall puncture.  A 4-French  arterial sheath and a 7-French venous sheath were inserted via the modified  Seldinger technique.  Preformed Judkins, pigtail and Swan-Ganz catheters  were utilized.  Of note, I cannulated the left main with a JL6 catheter.  The patient tolerated the procedure well and left the lab in stable  condition.   RESULTS:   HEMODYNAMICS:  1.  RA:  Mean 1.  2.  RV:  35/1.  3.  PA:  33/9 with a mean 19.  4.  Pulmonary capillary wedge pressure:  Mean 7.  5.  Aortic Pressure:  135/68.  6.  Aortic Saturation:  96%.  7.  PA Saturation:  68%.  8.  Cardiac output/Cardiac Index:  4.1/2.1 (FICK).   CORONARIES:  1.  LEFT MAIN:  Normal.  2.  LEFT ANTERIOR DESCENDING ARTERY:  Had mild luminal irregularities.  The      first and second diagonals were small and normal.  3.  CIRCUMFLEX:  In the AV groove is normal.  The first obtuse marginal was      small and normal.  The second obtuse marginal was large, branching and      normal.  4.  RIGHT CORONARY ARTERY:  A large dominant vessel and normal.  PDA was      moderate size and normal.  Posterolateral was moderate size and normal.   LEFT VENTRICULOGRAPHY:  The left ventricle I was unable to cross the aortic  valve  and did not probe extensively.   IMPRESSION:  Minimal coronary plaque.  The patient with previously known  aortic stenosis and ascending aortic root dilatation.   PLAN:  The patient is to follow with Dr. Cornelius Moras for planned aortic valve  replacement and root replacement.       JH/MEDQ  D:  03/10/2005  T:  03/10/2005  Job:  [id number]   cc:   Olga Millers, M.D. [facility]. Tawanna Cooler, M.D. [facility]. Cornelius Moras, M.D.  [address]  [place]  [place] [address]

## 2011-01-20 ENCOUNTER — Other Ambulatory Visit (INDEPENDENT_AMBULATORY_CARE_PROVIDER_SITE_OTHER): Payer: Medicare Other | Admitting: *Deleted

## 2011-01-20 DIAGNOSIS — Z79899 Other long term (current) drug therapy: Secondary | ICD-10-CM

## 2011-01-20 DIAGNOSIS — I4891 Unspecified atrial fibrillation: Secondary | ICD-10-CM

## 2011-01-20 LAB — CBC WITH DIFFERENTIAL/PLATELET
Basophils Absolute: 0 10*3/uL (ref 0.0–0.1)
Basophils Relative: 0.5 % (ref 0.0–3.0)
Eosinophils Absolute: 0.2 10*3/uL (ref 0.0–0.7)
Eosinophils Relative: 3.2 % (ref 0.0–5.0)
HCT: 36.5 % — ABNORMAL LOW (ref 39.0–52.0)
Hemoglobin: 12.4 g/dL — ABNORMAL LOW (ref 13.0–17.0)
Lymphocytes Relative: 18.3 % (ref 12.0–46.0)
Lymphs Abs: 1 10*3/uL (ref 0.7–4.0)
MCHC: 34 g/dL (ref 30.0–36.0)
MCV: 96.4 fl (ref 78.0–100.0)
Monocytes Absolute: 0.6 10*3/uL (ref 0.1–1.0)
Monocytes Relative: 11 % (ref 3.0–12.0)
Neutro Abs: 3.8 10*3/uL (ref 1.4–7.7)
Neutrophils Relative %: 67 % (ref 43.0–77.0)
Platelets: 193 10*3/uL (ref 150.0–400.0)
RBC: 3.79 Mil/uL — ABNORMAL LOW (ref 4.22–5.81)
RDW: 13.9 % (ref 11.5–14.6)
WBC: 5.7 10*3/uL (ref 4.5–10.5)

## 2011-03-28 ENCOUNTER — Other Ambulatory Visit (INDEPENDENT_AMBULATORY_CARE_PROVIDER_SITE_OTHER): Payer: Medicare Other

## 2011-03-28 DIAGNOSIS — D649 Anemia, unspecified: Secondary | ICD-10-CM

## 2011-03-28 DIAGNOSIS — M109 Gout, unspecified: Secondary | ICD-10-CM

## 2011-03-28 DIAGNOSIS — I10 Essential (primary) hypertension: Secondary | ICD-10-CM

## 2011-03-28 DIAGNOSIS — Z0000 Encounter for general adult medical examination without abnormal findings: Secondary | ICD-10-CM

## 2011-03-28 DIAGNOSIS — F411 Generalized anxiety disorder: Secondary | ICD-10-CM

## 2011-03-28 DIAGNOSIS — Z125 Encounter for screening for malignant neoplasm of prostate: Secondary | ICD-10-CM

## 2011-03-28 LAB — CBC WITH DIFFERENTIAL/PLATELET
Basophils Absolute: 0 10*3/uL (ref 0.0–0.1)
Basophils Relative: 0.5 % (ref 0.0–3.0)
Eosinophils Absolute: 0.2 10*3/uL (ref 0.0–0.7)
Eosinophils Relative: 3.4 % (ref 0.0–5.0)
HCT: 38.8 % — ABNORMAL LOW (ref 39.0–52.0)
Hemoglobin: 12.5 g/dL — ABNORMAL LOW (ref 13.0–17.0)
Lymphocytes Relative: 16.5 % (ref 12.0–46.0)
Lymphs Abs: 0.9 10*3/uL (ref 0.7–4.0)
MCHC: 32.2 g/dL (ref 30.0–36.0)
MCV: 97.1 fl (ref 78.0–100.0)
Monocytes Absolute: 0.5 10*3/uL (ref 0.1–1.0)
Monocytes Relative: 9.5 % (ref 3.0–12.0)
Neutro Abs: 4 10*3/uL (ref 1.4–7.7)
Neutrophils Relative %: 70.1 % (ref 43.0–77.0)
Platelets: 211 10*3/uL (ref 150.0–400.0)
RBC: 4 Mil/uL — ABNORMAL LOW (ref 4.22–5.81)
RDW: 14.8 % — ABNORMAL HIGH (ref 11.5–14.6)
WBC: 5.6 10*3/uL (ref 4.5–10.5)

## 2011-03-28 LAB — POCT URINALYSIS DIPSTICK
Bilirubin, UA: NEGATIVE
Blood, UA: NEGATIVE
Glucose, UA: NEGATIVE
Ketones, UA: NEGATIVE
Leukocytes, UA: NEGATIVE
Nitrite, UA: NEGATIVE
Protein, UA: NEGATIVE
Spec Grav, UA: 1.02
Urobilinogen, UA: 0.2
pH, UA: 6

## 2011-03-28 LAB — LIPID PANEL
Cholesterol: 148 mg/dL (ref 0–200)
HDL: 39.6 mg/dL (ref >39.00)
LDL Cholesterol: 93 mg/dL (ref 0–99)
Total CHOL/HDL Ratio: 4
Triglycerides: 77 mg/dL (ref 0.0–149.0)
VLDL: 15.4 mg/dL (ref 0.0–40.0)

## 2011-03-28 LAB — HEPATIC FUNCTION PANEL
ALT: 13 U/L (ref 0–53)
AST: 18 U/L (ref 0–37)
Albumin: 4.2 g/dL (ref 3.5–5.2)
Alkaline Phosphatase: 76 U/L (ref 39–117)
Bilirubin, Direct: 0.1 mg/dL (ref 0.0–0.3)
Total Bilirubin: 0.8 mg/dL (ref 0.3–1.2)
Total Protein: 7 g/dL (ref 6.0–8.3)

## 2011-03-28 LAB — BASIC METABOLIC PANEL
BUN: 16 mg/dL (ref 6–23)
CO2: 30 mEq/L (ref 19–32)
Calcium: 8.9 mg/dL (ref 8.4–10.5)
Chloride: 104 mEq/L (ref 96–112)
Creatinine, Ser: 0.7 mg/dL (ref 0.4–1.5)
GFR: 111.84 mL/min (ref >60.00)
Glucose, Bld: 95 mg/dL (ref 70–99)
Potassium: 4 mEq/L (ref 3.5–5.1)
Sodium: 141 mEq/L (ref 135–145)

## 2011-03-28 LAB — TSH: TSH: 0.58 u[IU]/mL (ref 0.35–5.50)

## 2011-03-28 LAB — PSA: PSA: 2.62 ng/mL (ref 0.10–4.00)

## 2011-04-04 ENCOUNTER — Ambulatory Visit (INDEPENDENT_AMBULATORY_CARE_PROVIDER_SITE_OTHER): Payer: Medicare Other | Admitting: Family Medicine

## 2011-04-04 ENCOUNTER — Encounter: Payer: Self-pay | Admitting: Family Medicine

## 2011-04-04 DIAGNOSIS — F411 Generalized anxiety disorder: Secondary | ICD-10-CM

## 2011-04-04 DIAGNOSIS — M109 Gout, unspecified: Secondary | ICD-10-CM

## 2011-04-04 DIAGNOSIS — F419 Anxiety disorder, unspecified: Secondary | ICD-10-CM | POA: Insufficient documentation

## 2011-04-04 DIAGNOSIS — D649 Anemia, unspecified: Secondary | ICD-10-CM

## 2011-04-04 DIAGNOSIS — Z79899 Other long term (current) drug therapy: Secondary | ICD-10-CM

## 2011-04-04 DIAGNOSIS — I10 Essential (primary) hypertension: Secondary | ICD-10-CM

## 2011-04-04 LAB — IRON: Iron: 86 ug/dL (ref 42–165)

## 2011-04-04 LAB — VITAMIN B12: Vitamin B-12: 365 pg/mL (ref 211–911)

## 2011-04-04 MED ORDER — AMLODIPINE BESYLATE 5 MG PO TABS: 5.0000 mg | ORAL_TABLET | Freq: Every day | ORAL | Status: DC

## 2011-04-04 MED ORDER — ZOLPIDEM TARTRATE 10 MG PO TABS: 10.0000 mg | ORAL_TABLET | Freq: Every evening | ORAL | Status: DC | PRN

## 2011-04-04 MED ORDER — ALLOPURINOL 300 MG PO TABS: 300.0000 mg | ORAL_TABLET | Freq: Every day | ORAL | Status: DC

## 2011-04-04 MED ORDER — DIAZEPAM 5 MG PO TABS
5.0000 mg | ORAL_TABLET | Freq: Two times a day (BID) | ORAL | Status: DC
Start: 2011-04-04 — End: 2011-04-13

## 2011-04-04 NOTE — Progress Notes (Signed)
Subjective:    Patient ID: [name], male    DOB: 17-Aug-1936, 75 y.o.   MRN: [id number]  HPI [name] is a 75 year old single male,,,,,,,,, his first wife died from pancreatic cancer,,,,,,,,,, married and then divorced,,,,,,, who comes in today for a general Medicare wellness examination because of a history of underlying hyperuricemia and gout, hypertension, a true fibrillation, anxiety, degenerative joint disease.  His hyperuricemia is treated with allopurinol 300 mg daily, uric acid level, not done.  His hypertension is treated with Norvasc 5 mg daily BP 140/80.  His atrial fibrillation is treated with pardaxa 150 mg b.i.d. By cardiology.  He takes Valium 5 mg p.r.n. For anxiety and Ambien 10 mg nightly for sleep dysfunction, usually related to anxiety.  He taken to Naprosyn tablets daily in the morning.  Hemoglobin has dropped to 12.5, probably iron deficiency.  Will check that and B12 level.  He scheduled in October, the 10th for right hip replacement by Dr. Lajoyce Corners.  He gets routine eye care, dental care, hearing normal, activities of daily living normally swims every day.  Cognitive function, normal, home health safety reviewed.  No issues identified, no guns in the house, he does have a living will and healthcare power of attorney.  Vaccinations up to date.  Tetanus 2000 5 Pneumovax 2006 information given on shingles   Review of Systems  Musculoskeletal: Positive for gait problem.       Objective:   Physical Exam  Constitutional: He is oriented to person, place, and time. He appears well-developed and well-nourished.  HENT:  Head: Normocephalic and atraumatic.  Right Ear: External ear normal.  Left Ear: External ear normal.  Nose: Nose normal.  Mouth/Throat: Oropharynx is clear and moist.  Eyes: Conjunctivae and EOM are normal. Pupils are equal, round, and reactive to light.  Neck: Normal range of motion. Neck supple. No JVD present. No tracheal deviation present.  No thyromegaly present.  Cardiovascular: Normal rate, regular rhythm and intact distal pulses.  Exam reveals no gallop and no friction rub.   Murmur heard.      Murmur in the aortic area grade 2 to 3 systolic murmur.  No diastolic murmur no click  Pulmonary/Chest: Effort normal and breath sounds normal. No stridor. No respiratory distress. He has no wheezes. He has no rales. He exhibits no tenderness.  Abdominal: Soft. Bowel sounds are normal. He exhibits no distension and no mass. There is no tenderness. There is no rebound and no guarding.  Genitourinary: Guaiac negative stool. No penile tenderness.       Urologic exam by Dr. Aldean Ast, 3 months ago, normal.  Therefore, not repeated  Musculoskeletal: Normal range of motion. He exhibits no edema and no tenderness.  Lymphadenopathy:    He has no cervical adenopathy.  Neurological: He is alert and oriented to person, place, and time. He has normal reflexes. No cranial nerve deficit. He exhibits normal muscle tone.  Skin: Skin is warm and dry. No rash noted. No erythema. No pallor.  Psychiatric: He has a normal mood and affect. His behavior is normal. Judgment and thought content normal.          Assessment & Plan:  Healthy male.  Degenerative joint disease, total hip replacement surgery scheduled October 10.  Low-grade anemia, check her and B12 level.  Advise to split his Naprosyn to one twice daily with food.  Hypertension.  Continue Norvasc 5 mg daily.  History of gout.  Continue allopurinol 300 daily  It ablation continue  pradaxa 150 mg b.i.d. Per direction of cardiology.  Anxiety and sleep dysfunction.  Valium 5 mg p.r.n., Ambien 10 mg p.r.n. For sleep.  Advised to get the shingles.  Vaccine.

## 2011-04-04 NOTE — Patient Instructions (Signed)
Continue your current medications.  Take one Naprosyn tablet twice daily with food.  I will call you when I get her lab work back.  Return in July 2013 for your annual exam sooner if any problems

## 2011-04-13 ENCOUNTER — Other Ambulatory Visit: Payer: Self-pay | Admitting: Family Medicine

## 2011-04-13 NOTE — Telephone Encounter (Signed)
Refill Diazepam 5mg  Zolpidem 10mg  at [facility]. Thanks.

## 2011-05-30 LAB — URINALYSIS, ROUTINE W REFLEX MICROSCOPIC
Bilirubin Urine: NEGATIVE
Glucose, UA: NEGATIVE mg/dL
Hgb urine dipstick: NEGATIVE
Ketones, ur: NEGATIVE mg/dL
Nitrite: NEGATIVE
Protein, ur: NEGATIVE mg/dL
Specific Gravity, Urine: 1.006 (ref 1.005–1.030)
Urobilinogen, UA: 0.2 mg/dL (ref 0.0–1.0)
pH: 7 (ref 5.0–8.0)

## 2011-05-30 LAB — COMPREHENSIVE METABOLIC PANEL
ALT: 16 U/L (ref 0–53)
AST: 21 U/L (ref 0–37)
Albumin: 4.1 g/dL (ref 3.5–5.2)
Alkaline Phosphatase: 64 U/L (ref 39–117)
BUN: 15 mg/dL (ref 6–23)
CO2: 32 mEq/L (ref 19–32)
Calcium: 9.9 mg/dL (ref 8.4–10.5)
Chloride: 96 mEq/L (ref 96–112)
Creatinine, Ser: 0.93 mg/dL (ref 0.4–1.5)
GFR calc Af Amer: >60 mL/min (ref >60)
GFR calc non Af Amer: >60 mL/min (ref >60)
Glucose, Bld: 96 mg/dL (ref 70–99)
Potassium: 3.9 mEq/L (ref 3.5–5.1)
Sodium: 139 mEq/L (ref 135–145)
Total Bilirubin: 1 mg/dL (ref 0.3–1.2)
Total Protein: 7.1 g/dL (ref 6.0–8.3)

## 2011-05-30 LAB — DIFFERENTIAL
Basophils Absolute: 0 10*3/uL (ref 0.0–0.1)
Basophils Relative: 0 % (ref 0–1)
Eosinophils Absolute: 0.1 10*3/uL (ref 0.0–0.7)
Eosinophils Relative: 1 % (ref 0–5)
Lymphocytes Relative: 12 % (ref 12–46)
Lymphs Abs: 1.1 10*3/uL (ref 0.7–4.0)
Monocytes Absolute: 0.6 10*3/uL (ref 0.1–1.0)
Monocytes Relative: 7 % (ref 3–12)
Neutro Abs: 6.9 10*3/uL (ref 1.7–7.7)
Neutrophils Relative %: 79 % — ABNORMAL HIGH (ref 43–77)

## 2011-05-30 LAB — CBC
HCT: 41.2 % (ref 39.0–52.0)
Hemoglobin: 14 g/dL (ref 13.0–17.0)
MCHC: 34.1 g/dL (ref 30.0–36.0)
MCV: 97.2 fL (ref 78.0–100.0)
Platelets: 210 10*3/uL (ref 150–400)
RBC: 4.24 MIL/uL (ref 4.22–5.81)
RDW: 13.4 % (ref 11.5–15.5)
WBC: 8.7 10*3/uL (ref 4.0–10.5)

## 2011-05-30 LAB — PROTIME-INR
INR: 1 (ref 0.00–1.49)
Prothrombin Time: 13.6 seconds (ref 11.6–15.2)

## 2011-05-30 LAB — APTT: aPTT: 27 seconds (ref 24–37)

## 2011-05-31 ENCOUNTER — Other Ambulatory Visit: Payer: Self-pay | Admitting: Orthopedic Surgery

## 2011-05-31 ENCOUNTER — Ambulatory Visit (HOSPITAL_COMMUNITY)
Admission: RE | Admit: 2011-05-31 | Discharge: 2011-05-31 | Disposition: A | Discharge status: Home / Self Care | Payer: Medicare Other | Source: Ambulatory Visit | Attending: Orthopedic Surgery | Admitting: Orthopedic Surgery

## 2011-05-31 ENCOUNTER — Encounter (HOSPITAL_COMMUNITY): Payer: Medicare Other

## 2011-05-31 ENCOUNTER — Other Ambulatory Visit (HOSPITAL_COMMUNITY): Payer: Self-pay | Admitting: Orthopedic Surgery

## 2011-05-31 DIAGNOSIS — M169 Osteoarthritis of hip, unspecified: Secondary | ICD-10-CM | POA: Insufficient documentation

## 2011-05-31 DIAGNOSIS — Z01818 Encounter for other preprocedural examination: Secondary | ICD-10-CM | POA: Insufficient documentation

## 2011-05-31 DIAGNOSIS — M1610 Unilateral primary osteoarthritis, unspecified hip: Secondary | ICD-10-CM | POA: Insufficient documentation

## 2011-05-31 DIAGNOSIS — Z954 Presence of other heart-valve replacement: Secondary | ICD-10-CM | POA: Insufficient documentation

## 2011-05-31 DIAGNOSIS — J449 Chronic obstructive pulmonary disease, unspecified: Secondary | ICD-10-CM | POA: Insufficient documentation

## 2011-05-31 DIAGNOSIS — I517 Cardiomegaly: Secondary | ICD-10-CM | POA: Insufficient documentation

## 2011-05-31 DIAGNOSIS — J4489 Other specified chronic obstructive pulmonary disease: Secondary | ICD-10-CM | POA: Insufficient documentation

## 2011-05-31 DIAGNOSIS — Z01812 Encounter for preprocedural laboratory examination: Secondary | ICD-10-CM | POA: Insufficient documentation

## 2011-05-31 LAB — SURGICAL PCR SCREEN
MRSA, PCR: NEGATIVE
Staphylococcus aureus: POSITIVE — AB

## 2011-05-31 LAB — COMPREHENSIVE METABOLIC PANEL
ALT: 15 U/L (ref 0–53)
AST: 22 U/L (ref 0–37)
Albumin: 4 g/dL (ref 3.5–5.2)
Alkaline Phosphatase: 86 U/L (ref 39–117)
BUN: 17 mg/dL (ref 6–23)
CO2: 29 mEq/L (ref 19–32)
Calcium: 9.3 mg/dL (ref 8.4–10.5)
Chloride: 102 mEq/L (ref 96–112)
Creatinine, Ser: 0.78 mg/dL (ref 0.50–1.35)
GFR calc Af Amer: >90 mL/min (ref >90)
GFR calc non Af Amer: 87 mL/min — ABNORMAL LOW (ref >90)
Glucose, Bld: 94 mg/dL (ref 70–99)
Potassium: 3.8 mEq/L (ref 3.5–5.1)
Sodium: 138 mEq/L (ref 135–145)
Total Bilirubin: 0.4 mg/dL (ref 0.3–1.2)
Total Protein: 7.5 g/dL (ref 6.0–8.3)

## 2011-05-31 LAB — URINALYSIS, ROUTINE W REFLEX MICROSCOPIC
Bilirubin Urine: NEGATIVE
Glucose, UA: NEGATIVE mg/dL
Hgb urine dipstick: NEGATIVE
Ketones, ur: NEGATIVE mg/dL
Leukocytes, UA: NEGATIVE
Nitrite: NEGATIVE
Protein, ur: NEGATIVE mg/dL
Specific Gravity, Urine: 1.008 (ref 1.005–1.030)
Urobilinogen, UA: 0.2 mg/dL (ref 0.0–1.0)
pH: 6.5 (ref 5.0–8.0)

## 2011-05-31 LAB — CBC
HCT: 35.6 % — ABNORMAL LOW (ref 39.0–52.0)
Hemoglobin: 12.1 g/dL — ABNORMAL LOW (ref 13.0–17.0)
MCH: 31.3 pg (ref 26.0–34.0)
MCHC: 34 g/dL (ref 30.0–36.0)
MCV: 92 fL (ref 78.0–100.0)
Platelets: 197 10*3/uL (ref 150–400)
RBC: 3.87 MIL/uL — ABNORMAL LOW (ref 4.22–5.81)
RDW: 13.4 % (ref 11.5–15.5)
WBC: 6.2 10*3/uL (ref 4.0–10.5)

## 2011-05-31 LAB — PROTIME-INR
INR: 1.37 (ref 0.00–1.49)
Prothrombin Time: 17.1 seconds — ABNORMAL HIGH (ref 11.6–15.2)

## 2011-05-31 LAB — DIFFERENTIAL
Basophils Absolute: 0 10*3/uL (ref 0.0–0.1)
Basophils Relative: 0 % (ref 0–1)
Eosinophils Absolute: 0.2 10*3/uL (ref 0.0–0.7)
Eosinophils Relative: 4 % (ref 0–5)
Lymphocytes Relative: 20 % (ref 12–46)
Lymphs Abs: 1.2 10*3/uL (ref 0.7–4.0)
Monocytes Absolute: 0.7 10*3/uL (ref 0.1–1.0)
Monocytes Relative: 11 % (ref 3–12)
Neutro Abs: 4 10*3/uL (ref 1.7–7.7)
Neutrophils Relative %: 65 % (ref 43–77)

## 2011-05-31 LAB — APTT: aPTT: 54 seconds — ABNORMAL HIGH (ref 24–37)

## 2011-06-02 LAB — TYPE AND SCREEN
ABO/RH(D): O POS
Antibody Screen: NEGATIVE

## 2011-06-02 LAB — ABO/RH: ABO/RH(D): O POS

## 2011-06-05 ENCOUNTER — Other Ambulatory Visit: Payer: Self-pay | Admitting: Cardiology

## 2011-06-07 ENCOUNTER — Inpatient Hospital Stay (HOSPITAL_COMMUNITY): Payer: Medicare Other

## 2011-06-07 ENCOUNTER — Inpatient Hospital Stay (HOSPITAL_COMMUNITY)
Admission: RE | Admit: 2011-06-07 | Discharge: 2011-06-11 | DRG: 470 | Disposition: A | Discharge status: Home Health | Payer: Medicare Other | Source: Ambulatory Visit | Attending: Orthopedic Surgery | Admitting: Orthopedic Surgery

## 2011-06-07 DIAGNOSIS — I4891 Unspecified atrial fibrillation: Secondary | ICD-10-CM | POA: Diagnosis present

## 2011-06-07 DIAGNOSIS — Z01818 Encounter for other preprocedural examination: Secondary | ICD-10-CM

## 2011-06-07 DIAGNOSIS — D62 Acute posthemorrhagic anemia: Secondary | ICD-10-CM | POA: Diagnosis not present

## 2011-06-07 DIAGNOSIS — M169 Osteoarthritis of hip, unspecified: Principal | ICD-10-CM | POA: Diagnosis present

## 2011-06-07 DIAGNOSIS — M069 Rheumatoid arthritis, unspecified: Secondary | ICD-10-CM | POA: Diagnosis present

## 2011-06-07 DIAGNOSIS — Z01812 Encounter for preprocedural laboratory examination: Secondary | ICD-10-CM

## 2011-06-07 DIAGNOSIS — M109 Gout, unspecified: Secondary | ICD-10-CM | POA: Diagnosis present

## 2011-06-07 DIAGNOSIS — I10 Essential (primary) hypertension: Secondary | ICD-10-CM | POA: Diagnosis present

## 2011-06-07 DIAGNOSIS — M1610 Unilateral primary osteoarthritis, unspecified hip: Principal | ICD-10-CM | POA: Diagnosis present

## 2011-06-07 LAB — PROTIME-INR
INR: 1.04 (ref 0.00–1.49)
Prothrombin Time: 13.8 seconds (ref 11.6–15.2)

## 2011-06-07 LAB — APTT: aPTT: 30 seconds (ref 24–37)

## 2011-06-08 LAB — PROTIME-INR
INR: 1.18 (ref 0.00–1.49)
Prothrombin Time: 15.3 seconds — ABNORMAL HIGH (ref 11.6–15.2)

## 2011-06-08 LAB — HEMOGLOBIN AND HEMATOCRIT, BLOOD
HCT: 26.5 % — ABNORMAL LOW (ref 39.0–52.0)
Hemoglobin: 8.9 g/dL — ABNORMAL LOW (ref 13.0–17.0)

## 2011-06-09 ENCOUNTER — Inpatient Hospital Stay (HOSPITAL_COMMUNITY): Payer: Medicare Other

## 2011-06-09 LAB — HEMOGLOBIN AND HEMATOCRIT, BLOOD
HCT: 26 % — ABNORMAL LOW (ref 39.0–52.0)
Hemoglobin: 8.8 g/dL — ABNORMAL LOW (ref 13.0–17.0)

## 2011-06-09 LAB — SYNOVIAL FLUID, CRYSTAL

## 2011-06-09 LAB — PROTIME-INR
INR: 1.34 (ref 0.00–1.49)
Prothrombin Time: 16.8 seconds — ABNORMAL HIGH (ref 11.6–15.2)

## 2011-06-10 LAB — HEMOGLOBIN AND HEMATOCRIT, BLOOD
HCT: 23.4 % — ABNORMAL LOW (ref 39.0–52.0)
Hemoglobin: 8 g/dL — ABNORMAL LOW (ref 13.0–17.0)

## 2011-06-10 LAB — PREPARE RBC (CROSSMATCH)

## 2011-06-10 LAB — PROTIME-INR
INR: 1.69 — ABNORMAL HIGH (ref 0.00–1.49)
Prothrombin Time: 20.2 seconds — ABNORMAL HIGH (ref 11.6–15.2)

## 2011-06-10 NOTE — H&P (Signed)
  [name]               ACCOUNT NO.:  [id number]  MEDICAL RECORD NO.:  [id number]  LOCATION:                                 FACILITY:  PHYSICIAN:  [name]. [name], M.D.DATE OF BIRTH:  12/24/1935  DATE OF ADMISSION:  06/07/2011 DATE OF DISCHARGE:                             HISTORY & PHYSICAL   CHIEF COMPLAINT:  Right hip pain.  HISTORY OF PRESENT ILLNESS:  The patient is a 75 year old male with worsening right hip pain secondary to end-stage osteoarthritis.  The patient elected to have right total hip arthroplasty by Dr. Ranee Francis to decrease pain and increase function.  PAST MEDICAL HISTORY: 1. Hypertension. 2. AFib. 3. Heart murmur. 4. Rheumatoid arthritis.  FAMILY MEDICAL HISTORY:  Diabetes.  SOCIAL HISTORY:  Patient of Dr. Tawanna Francis.  Occasional alcohol use.  Does not smoke.  DRUG ALLERGIES:  None.  CURRENT MEDICATIONS: 1. Allopurinol 300 mg daily. 2. Amlodipine 5 mg daily. 3. Pradaxa 150 mg daily. 4. Valium 5 mg t.i.d. p.r.n. 5. Naprosyn 220 mg daily. 6. Ambien 10 mg q.h.s.  REVIEW OF SYSTEMS:  She has pain on range of motion and ambulation of the right hip.  PHYSICAL EXAMINATION:  VITAL SIGNS:  Pulse 76, respiration 14, blood pressure 170/88. GENERAL:  The patient is a healthy-appearing 75 year old male in no acute distress.  Pleasant mood and affect, oriented x3. HEAD AND NECK:  Shows full range of motion without any difficulty. Cranial nerves II through XII are grossly intact. NECK:  Shows full range of motion. CHEST:  Active breath sounds bilaterally.  No wheeze, rhonchi, or rales. HEART:  Shows irregular rate with a 3/6 systolic murmur, rate consistent with his AFib. ABDOMEN:  Nontender, nondistended with active bowel sounds. EXTREMITIES:  Shows moderate tenderness with internal and external rotation of the right hip with groin pain. NEUROLOGIC:  He is intact distally.  He does have a painful gait.  RADIOGRAPHIC DATA:  X-rays show  end-stage osteoarthritis, right hip.  IMPRESSION:  Right hip end-stage osteoarthritis.  PLAN OF ACTION:  Right total hip arthroplasty by Dr. Ranee Francis.     [name], P.A.   ______________________________ [name] [name], M.D.    TBD/MEDQ  D:  05/24/2011  T:  05/25/2011  Job:  [id number]  Electronically Signed by [name] P.A. on 06/06/2011 03:56:09 PM Electronically Signed by [name] M.D. on 06/10/2011 10:04:01 AM

## 2011-06-10 NOTE — Op Note (Signed)
NAMEAUSTINE, [name] NO.:  [id number]  MEDICAL RECORD NO.:  [id number]  LOCATION:  1528                         FACILITY:  [facility]  PHYSICIAN:  Georges Lynch. Ivon Roedel, M.D.DATE OF BIRTH:  12-16-1935  DATE OF PROCEDURE:  06/07/2011 DATE OF DISCHARGE:                              OPERATIVE REPORT   PREOPERATIVE DIAGNOSES:  Severe like degenerative arthritis of the right hip.  He had basically bone-on-bone and the very little motion he had was very painful.  He really could not flex, extend, abduct, or adduct. He had a marked antalgic gait.  Preoperatively, we tried to control both antiinflammatories and pain medicines, but he cannot tolerate this pain.  OPERATION:  Right total hip arthroplasty utilizing DePuy system.  The sizes used, the acetabulum cup was a size 54 mm Pinnacle cup, we utilized 1 cancellous screw 6.5 mm in diameter.  The cup liner was an Altrex polyethylene liner +4 neutral 36 mm inside diameter.  The femoral stem was a size 6 high offset Tri-Lock stem.  We used a hole eliminator. The head was a ceramic head +1.5 in length, 36 mm diameter.  The procedure before surgery actually carried out, we first did a sterile prepping and draping of the patient on his left side and right side up.  At this time, the appropriate time-out was carried out in the operating room.  I marked the appropriate right leg in the holding area.  SURGEON:  Georges Lynch. Darrelyn Hillock, M.D.  ASSISTANT:  Madlyn Frankel. Charlann Boxer, M.D.  With the patient on left side right side up after sterile prepping and draping, I carried out a posterolateral approach to the right hip. Bleeders identified and cauterized.  At this time, self-retaining retractors were inserted.  I partially detached the external rotators. At this time, I preserved the piriformis.  I went down and did a capsulectomy.  Great care was taken to protect the sciatic nerve at all time.  The capsule was severely scarred down.  Dr. Charlann Boxer  was the assistant and did assist in the capsulectomy and also assisted in the traction.  Once this was done, the head was dislocated by Dr. Charlann Boxer on the opposite side of the table.  I then protected the underlying soft tissue and amputated the femoral head at the appropriate level.  I then utilized the box osteotome to remove the cancellous bone from the greater trochanter.  I then utilized the widening reamer and then a canal finer.  Following that, I went ahead and then rasped up to a size 6 Tri-Lock stem.  We then irrigated out the canal, packed the canal with a sponge, which was later removed and then attention was directed to the acetabulum.  Dr. Charlann Boxer did resect a portion of the capsule.  I then completed the capsulectomy.  We then reamed the acetabulum up to a size 53 for 54 mm cup.  There were 2 cysts in the acetabulum and we curetted out and packed with the patient's own bone.  After that, we then inserted our 54 mm cup.  The cup then was affixed with 1 cancellous screw.  Prior to affixing the cup, we did utilize the Charnley guide  and we had good position of the cup.  We then inserted our polyethylene liner after the hole eliminator was inserted, irrigated out the area then and then inserted our permanent size 6 high offset Tri-Lock stem, but prior to doing that we did go through the trials for leg length and also to determine whether we needed a Tri-Lock stem.  At that particular time, we then went on and inserted our permanent high offset size 6 Tri- Lock stem.  We went through trials again and finally utilized a +1.5 ceramic ball, reduced the hip and had excellent motion and excellent leg lengths.  The hip was somewhat tight, but we did do a partial anterior capsulectomy and also removed the osteophytes from the acetabulum, thoroughly irrigated out the area and we had good stability.  Dr. Charlann Boxer reduced the hip from inside of the table and then I inserted Hemovac drain and closed  the wound layers in usual fashion after inserting some thrombin-soaked Gelfoam into the subacetabular space.  The patient left the operative room in satisfactory condition.  ESTIMATED BLOOD LOSS:  About 250 cc.          ______________________________ Georges Lynch. Darrelyn Hillock, M.D.     RAG/MEDQ  D:  06/07/2011  T:  06/08/2011  Job:  [id number]  cc:   Madolyn Frieze. Jens Som, MD, [facility] [address]  [address] [place] [address]  Electronically Signed by Ranee Gosselin M.D. on 06/10/2011 10:04:03 AM

## 2011-06-11 LAB — TYPE AND SCREEN
ABO/RH(D): O POS
Antibody Screen: NEGATIVE
Unit division: 0
Unit division: 0

## 2011-06-11 LAB — PROTIME-INR
INR: 1.99 — ABNORMAL HIGH (ref 0.00–1.49)
Prothrombin Time: 22.9 seconds — ABNORMAL HIGH (ref 11.6–15.2)

## 2011-06-11 LAB — HEMOGLOBIN AND HEMATOCRIT, BLOOD
HCT: 25.6 % — ABNORMAL LOW (ref 39.0–52.0)
Hemoglobin: 8.7 g/dL — ABNORMAL LOW (ref 13.0–17.0)

## 2011-06-12 ENCOUNTER — Telehealth: Payer: Self-pay | Admitting: Cardiology

## 2011-06-12 NOTE — Telephone Encounter (Signed)
Spoke with pt, he recently had hip surgery and was told not to take pradaxa to take coumadin. Pt is unsure of what to do. Will forward for dr Jens Som review Deliah Goody

## 2011-06-12 NOTE — Telephone Encounter (Signed)
I do not know why surgeons told him this; once he has completed coumadin from their standpoint, would dc coumadin and resume pradaxa when INR<1.8. If questions about coumadin, he needs to contact surgeons who made the change. Olga Millers

## 2011-06-12 NOTE — Telephone Encounter (Signed)
Pt calling re question on going back coumadin and stopping plavix

## 2011-06-12 NOTE — Telephone Encounter (Signed)
Spoke with dr gioffre's office, they are going to ask dr Darrelyn Hillock when he returns to the office this afternoon and call the pt. The pt is aware they are to call, he is also aware we are fine with whatever the surgeon's need for him to do as far as his surgery is concerned. He will let us know if they prefer coumadin so we can get him restarted on pradaxa after the coumadin for his surgery is done [employer]

## 2011-06-13 LAB — BODY FLUID CULTURE: Culture: NO GROWTH

## 2011-06-19 NOTE — Op Note (Signed)
  NAMECAYDIN, YEATTS               ACCOUNT NO.:  [id number]  MEDICAL RECORD NO.:  [id number]  LOCATION:                                 FACILITY:  PHYSICIAN:  Georges Lynch. Maryse Brierley, M.D.DATE OF BIRTH:  [birth year]  DATE OF PROCEDURE:  06/09/2011 DATE OF DISCHARGE:                              OPERATIVE REPORT   PRE-ASPIRATION DIAGNOSIS:  Gouty arthritis, left knee.  Little background, I made rounds today on June 09, 2011, and he had a very tense swollen left knee.  He had no erythema.  No increased temperature.  No signs of any infection.  He does have a history of gout.  X-rays of his knee were ordered showed that he had degenerative arthritis.  IMPRESSION:  Degenerative arthritis with superimposed gouty arthritis.  TREATMENT:  I came up to the floor this evening at 8 o'clock, did a sterile prep on his knee, aspirated about 70 cc of cloudy, gout-like fluid.  The fluid was sent to the lab for analysis.  There certainly were no signs of any infection.  I then injected 5 cc of 0.5% Marcaine with 2 cc of Depo-Medrol, and I will go and apply ice to the area.  He will be maintained on his Coumadin at this point.          ______________________________ Georges Lynch. Darrelyn Hillock, M.D.     RAG/MEDQ  D:  06/09/2011  T:  06/10/2011  Job:  [id number]  Electronically Signed by Ranee Gosselin M.D. on 06/19/2011 10:49:25 PM

## 2011-07-11 NOTE — Discharge Summary (Signed)
NAMEKHIYAN, CRACE NO.:  [id number]  MEDICAL RECORD NO.:  [id number]  LOCATION:  1528                         FACILITY:  [facility]  PHYSICIAN:  Georges Lynch. Gioffre, M.D.DATE OF BIRTH:  Jan 08, 1936  DATE OF ADMISSION:  06/07/2011 DATE OF DISCHARGE:  06/11/2011                              DISCHARGE SUMMARY   ADMITTING DIAGNOSES: 1. Osteoarthritis, right hip. 2. Hypertension. 3. Atrial fibrillation. 4. Cardiac murmur. 5. Rheumatoid arthritis.  DISCHARGE DIAGNOSES: 1. Osteoarthritis, right hip, status post right total hip replacement     arthroplasty. 2. Postoperative acute blood loss anemia, did not require transfusion. 3. Hypertension. 4. Atrial fibrillation. 5. Cardiac murmur. 6. Rheumatoid arthritis.  PROCEDURE:  June 07, 2011, right total hip.  SURGEON:  Georges Lynch. Darrelyn Hillock, M.D.  ASSISTANT:  Madlyn Frankel. Charlann Boxer, M.D.  CONSULTS:  None.  BRIEF HISTORY:  The patient is a 75 year old male, who has been seen by Dr. Ranee Gosselin for ongoing right hip pain, with progressive symptoms.  He has known end-stage arthritis of the hip and felt to be a good candidate.  Risks and benefits have been discussed.  He elected to proceed with surgery.  LABORATORY DATA:  I do not have the admission CBC scanned into the chart, but the postop hemoglobin was 8.9, drifted down to 8, came back up a little bit and last night H and H was 8.7 and 25.6.  PT/INR on day of admission 13.8 and 1.04.  Serial prothrombin times followed per Coumadin protocol.  Last done PT are 22.9 and 1.99.  Blood group type O positive.  Fluid aspirate culture, taken from the left knee, no growth.  X-RAYS:  Portable right hip films shows SPECT appearance of the right hip arthroplasty.  Left knee film showed advanced degenerative changes and effusion, negative for fracture.  HOSPITAL COURSE:  The patient admitted to [facility], taken to OR, underwent above-stated procedure without  complication.  The patient tolerated well and later transferred to recovery room of orthopedic floor, given 24 hours postop IV antibiotics.  Started on p.o. and IV analgesic pain control following surgery.  Started getting up out of bed on day 1, adhering to hip precautions and PT and OT were consulted.  The patient seen in rounds by Dr. Darrelyn Hillock.  Hemovac drain was pulled on day 1, doing well.  Hemoglobin was 8 and 9, asymptomatic, started getting up on day 2.  Fortunately, on day 2, it has been noticed to have swelling in the left knee.  He had a history of gout and Dr. Lissa Hoard decided to aspirate the knee and later that evening, he had aspirate of about 70 mL of cloudy fluid consistent with gout.  It was sent off for culture, which proved to be negative.  He was doing better after the knee was aspirated, continue progress with therapy and over the weekend on Saturday and Sunday.  He was seen in rounds  by [employer].  They continued to work with PT, and by day 4, he was doing better, met goals and was discharged home.  DISCHARGE PLAN: 1. The patient discharged home on June 11, 2011. 2. Discharge diagnoses:  Please see above. 3.  Discharge meds:  Oxycodone, Pradaxa, amlodipine, allopurinol,     Robaxin. 4. Diet:  Heart healthy diet. 5. Activity:  Hip precautions, total protocol, home health PT, follow     up in 2 weeks. 6. Disposition and condition on discharge:  Slowly improving.     Alexzandrew L. Perkins, P.A.C.   ______________________________ Georges Lynch Darrelyn Hillock, M.D.    ALP/MEDQ  D:  07/11/2011  T:  07/11/2011  Job:  [id number]

## 2011-07-17 ENCOUNTER — Other Ambulatory Visit: Payer: Self-pay | Admitting: *Deleted

## 2011-07-17 MED ORDER — TRIAMCINOLONE ACETONIDE 0.025 % EX OINT: TOPICAL_OINTMENT | Freq: Two times a day (BID) | CUTANEOUS | Status: AC

## 2011-10-11 ENCOUNTER — Other Ambulatory Visit: Payer: Self-pay | Admitting: Family Medicine

## 2011-10-19 ENCOUNTER — Ambulatory Visit (INDEPENDENT_AMBULATORY_CARE_PROVIDER_SITE_OTHER): Payer: Medicare Other | Admitting: Family Medicine

## 2011-10-19 VITALS — BP 147/66 | HR 83 | Temp 97.7°F | Resp 16 | Ht 65.75 in | Wt 175.2 lb

## 2011-10-19 DIAGNOSIS — L03039 Cellulitis of unspecified toe: Secondary | ICD-10-CM

## 2011-10-19 DIAGNOSIS — L03031 Cellulitis of right toe: Secondary | ICD-10-CM

## 2011-10-19 MED ORDER — DOXYCYCLINE HYCLATE 50 MG PO CAPS: 100.0000 mg | ORAL_CAPSULE | Freq: Two times a day (BID) | ORAL | Status: AC

## 2011-10-21 NOTE — Progress Notes (Signed)
  Subjective:    Patient ID: [name], male    DOB: 1936/01/20, 76 y.o.   MRN: [id number]  HPI Patient presents complainig of (L) toe pain.  Redness developed around nail bed; some pus may have drained  Patient has a history of infected ingrown nails. No history of gout No trauma to foot   Review of Systems  Constitutional: Negative for fever.  Skin: Positive for color change and wound.       Objective:   Physical Exam  Constitutional: He appears well-developed and well-nourished.  Cardiovascular: Normal rate, regular rhythm and normal heart sounds.   Pulmonary/Chest: Effort normal.  Skin: There is erythema (around base of (R) first toenail).          Assessment & Plan:  Paronychia (R) great toe; spontaneously drained  See medications Rx on AVS Silvadene to irritated skin Anticipatory guidance Call with follow up 72 hours

## 2011-10-23 ENCOUNTER — Telehealth: Payer: Self-pay

## 2011-10-23 NOTE — Telephone Encounter (Signed)
Reviewed note

## 2011-10-23 NOTE — Telephone Encounter (Signed)
Per her instructions, patient wants Dr. Hal Hope to know he is doing better today.

## 2011-10-27 ENCOUNTER — Telehealth: Payer: Self-pay | Admitting: Family Medicine

## 2011-10-27 NOTE — Telephone Encounter (Signed)
Fleet Contras, can you conact this patient? This is the one you gave me that Dr. Tawanna Cooler said he would not do a prior auth on - that the pt could pay cash. Thanks!

## 2011-10-27 NOTE — Telephone Encounter (Signed)
Pt said that he called approx 2 wks ago and spoke to someone here at [facility] re: a letter he rcvd from his insurance stating that they will not cover pts Diazepam and needed prior auth. Pt said that he had been expecting a call back.

## 2011-10-27 NOTE — Telephone Encounter (Signed)
Spoke with patient and explained insurance will not cover the Diazepam. He says he is only using the medication PRN but will call back if change needs to be made.

## 2011-11-10 ENCOUNTER — Other Ambulatory Visit: Payer: Self-pay | Admitting: *Deleted

## 2011-11-10 MED ORDER — ZOLPIDEM TARTRATE 10 MG PO TABS: 10.0000 mg | ORAL_TABLET | Freq: Every evening | ORAL | Status: DC | PRN

## 2011-11-23 ENCOUNTER — Telehealth: Payer: Self-pay | Admitting: Family Medicine

## 2011-11-23 NOTE — Telephone Encounter (Signed)
Spoke with patient and suggested that he try Benadryl.  If there is no improvement or SOB he should see Dr Tawanna Cooler

## 2011-11-23 NOTE — Telephone Encounter (Signed)
Patient called stating that he is itching all over his body and will be at the number provided until 6 pm. Please advise.

## 2011-11-27 ENCOUNTER — Ambulatory Visit (INDEPENDENT_AMBULATORY_CARE_PROVIDER_SITE_OTHER): Payer: Medicare Other | Admitting: Family Medicine

## 2011-11-27 ENCOUNTER — Encounter: Payer: Self-pay | Admitting: Family Medicine

## 2011-11-27 DIAGNOSIS — L509 Urticaria, unspecified: Secondary | ICD-10-CM

## 2011-11-27 MED ORDER — PREDNISONE 20 MG PO TABS: ORAL_TABLET | ORAL | Status: DC

## 2011-11-27 NOTE — Patient Instructions (Signed)
Take the prednisone as directed  Claritin 10 mg in the morning and Benadryl 25 mg at bedtime as needed for itching  Return when necessary

## 2011-11-27 NOTE — Progress Notes (Signed)
  Subjective:    Patient ID: [name], male    DOB: Aug 27, 1936, 76 y.o.   MRN: [id number]  HPI [name] is a 76 year old male who comes in today for evaluation of urticaria x1 week  He states last Monday he began itching all over and developed a red rash. He does not recall any new food or medication. He's never had this before. He does have a history of minor allergic rhinitis   Review of Systems    general and metabolic and dermatologic review of systems otherwise negative Objective:   Physical Exam  Well-developed and nourished man no acute distress examination of skin shows it to be erythematous without discrete hives      Assessment & Plan:  Urticaria E. elegy unknown plan prednisone burst and taper return when necessary

## 2011-12-20 ENCOUNTER — Ambulatory Visit (INDEPENDENT_AMBULATORY_CARE_PROVIDER_SITE_OTHER): Payer: Medicare Other | Admitting: Cardiology

## 2011-12-20 ENCOUNTER — Encounter: Payer: Self-pay | Admitting: Cardiology

## 2011-12-20 VITALS — BP 170/83 | HR 91 | Ht 69.0 in | Wt 173.0 lb

## 2011-12-20 DIAGNOSIS — Z952 Presence of prosthetic heart valve: Secondary | ICD-10-CM

## 2011-12-20 DIAGNOSIS — I4891 Unspecified atrial fibrillation: Secondary | ICD-10-CM

## 2011-12-20 DIAGNOSIS — Z954 Presence of other heart-valve replacement: Secondary | ICD-10-CM

## 2011-12-20 DIAGNOSIS — I10 Essential (primary) hypertension: Secondary | ICD-10-CM

## 2011-12-20 DIAGNOSIS — I359 Nonrheumatic aortic valve disorder, unspecified: Secondary | ICD-10-CM

## 2011-12-20 LAB — CBC WITH DIFFERENTIAL/PLATELET
Basophils Absolute: 0 10*3/uL (ref 0.0–0.1)
Basophils Relative: 0.6 % (ref 0.0–3.0)
Eosinophils Absolute: 0 10*3/uL (ref 0.0–0.7)
Eosinophils Relative: 0.7 % (ref 0.0–5.0)
HCT: 39.9 % (ref 39.0–52.0)
Hemoglobin: 13.3 g/dL (ref 13.0–17.0)
Lymphocytes Relative: 11.1 % — ABNORMAL LOW (ref 12.0–46.0)
Lymphs Abs: 0.6 10*3/uL — ABNORMAL LOW (ref 0.7–4.0)
MCHC: 33.2 g/dL (ref 30.0–36.0)
MCV: 97.7 fl (ref 78.0–100.0)
Monocytes Absolute: 0.2 10*3/uL (ref 0.1–1.0)
Monocytes Relative: 3 % (ref 3.0–12.0)
Neutro Abs: 4.8 10*3/uL (ref 1.4–7.7)
Neutrophils Relative %: 84.6 % — ABNORMAL HIGH (ref 43.0–77.0)
Platelets: 182 10*3/uL (ref 150.0–400.0)
RBC: 4.09 Mil/uL — ABNORMAL LOW (ref 4.22–5.81)
RDW: 14.4 % (ref 11.5–14.6)
WBC: 5.7 10*3/uL (ref 4.5–10.5)

## 2011-12-20 LAB — BASIC METABOLIC PANEL
BUN: 19 mg/dL (ref 6–23)
CO2: 27 mEq/L (ref 19–32)
Calcium: 9.4 mg/dL (ref 8.4–10.5)
Chloride: 105 mEq/L (ref 96–112)
Creatinine, Ser: 0.9 mg/dL (ref 0.4–1.5)
GFR: 88.8 mL/min (ref >60.00)
Glucose, Bld: 112 mg/dL — ABNORMAL HIGH (ref 70–99)
Potassium: 3.9 mEq/L (ref 3.5–5.1)
Sodium: 139 mEq/L (ref 135–145)

## 2011-12-20 NOTE — Assessment & Plan Note (Signed)
Status post aortic valve replacement. Continue SBE prophylaxis. Repeat echocardiogram. 

## 2011-12-20 NOTE — Patient Instructions (Signed)
Your physician wants you to follow-up in: ONE YEAR WITH DR CRENSHAW You will receive a reminder letter in the mail two months in advance. If you don't receive a letter, please call our office to schedule the follow-up appointment.   Your physician has requested that you have an echocardiogram. Echocardiography is a painless test that uses sound waves to create images of your heart. It provides your doctor with information about the size and shape of your heart and how well your heart's chambers and valves are working. This procedure takes approximately one hour. There are no restrictions for this procedure.   Your physician recommends that you HAVE LAB WORK TODAY 

## 2011-12-20 NOTE — Progress Notes (Signed)
HPI: [name] is a very pleasant gentleman who has a history of aortic valve replacement secondary to aortic stenosis with a pericardial tissue valve and thoracic aortic aneurysm repair in July 2006. Preoperative cardiac catheterization revealed normal coronary arteries. His last CTA was performed in October of 2009. No aneurysm or dissection. He has a history of atrial fibrillation and has had a previous DCCV. A TSH was normal. An echocardiogram performed in August of 2011 and revealed normal LV function, trivial aortic insufficiency and biatrial enlargement. Also note we previously performed carotid Dopplers due to bruits which revealed 0-39% bilateral stenosis. A monitor in August of 2011showed occasional 3.1 second pause. Tenoretic was discontinued and Norvasc added for his blood pressure. Atrial fibrillation is now being treated with rate control and anticoagulation. I last saw him in April 2012. Since then, the patient has dyspnea with more extreme activities but not with routine activities. It is relieved with rest. It is not associated with chest pain. There is no orthopnea, PND or pedal edema. There is no syncope or palpitations. There is no exertional chest pain. No bleeding    Current Outpatient Prescriptions  Medication Sig Dispense Refill  . allopurinol (ZYLOPRIM) 300 MG tablet Take 1 tablet (300 mg total) by mouth daily.  100 tablet  3  . amLODipine (NORVASC) 5 MG tablet Take 1 tablet (5 mg total) by mouth daily.  100 tablet  3  . naproxen sodium (ANAPROX) 220 MG tablet Take 220 mg by mouth 2 (two) times daily with a meal.        . PRADAXA 150 MG CAPS TAKE ONE CAPSULE BY MOUTH TWICE DAILY  60 capsule  12  . predniSONE (DELTASONE) 20 MG tablet 2 tabs x3 days, 1 tab x3 days, a half a tab x3 days, then a half a tablet Monday Wednesday Friday for a 2 week taper  40 tablet  1  . triamcinolone (KENALOG) 0.025 % ointment Apply topically 2 (two) times daily. 1:1 eucerine cream  454 g  1  .  zolpidem (AMBIEN) 10 MG tablet Take 1 tablet (10 mg total) by mouth at bedtime as needed for sleep.  90 tablet  1     Past Medical History  Diagnosis Date  . HTN (hypertension)   . Atrial fibrillation   . Aortic stenosis   . Erectile dysfunction   . Vertigo   . Gout   . GERD (gastroesophageal reflux disease)   . DJD (degenerative joint disease)   . MVP (mitral valve prolapse)   . Hernia   . Baker cyst     Past Surgical History  Procedure Date  . Hernia repair   . Cartilage surgery   . Hernia repair   . Aortic valve replacement     History   Social History  . Marital Status: Single    Spouse Name: N/A    Number of Children: N/A  . Years of Education: N/A   Occupational History  . Not on file.   Social History Main Topics  . Smoking status: Former Games developer  . Smokeless tobacco: Not on file   Comment: quit appox 6 years ago  . Alcohol Use: Yes  . Drug Use: No  . Sexually Active: Not on file   Other Topics Concern  . Not on file   Social History Narrative  . No narrative on file    ROS: no fevers or chills, productive cough, hemoptysis, dysphasia, odynophagia, melena, hematochezia, dysuria, hematuria, rash, seizure activity, orthopnea,  PND, pedal edema, claudication. Remaining systems are negative.  Physical Exam: Well-developed well-nourished in no acute distress.  Skin is warm and dry.  HEENT is normal.  Neck is supple.  Chest is clear to auscultation with normal expansion.  Cardiovascular exam is irregular, 2/6 systolic murmur left sternal border. No diastolic murmur.  Abdominal exam nontender or distended. No masses palpated. Extremities show no edema. neuro grossly intact  ECG atrial fibrillation at a rate of 91. Axis normal. RV conduction delay. No ST changes.

## 2011-12-20 NOTE — Assessment & Plan Note (Signed)
Rate is controlled on no medications. Continue Pradaxa. Check potassium, renal function and hemoglobin.

## 2011-12-20 NOTE — Assessment & Plan Note (Signed)
Blood pressure mildly elevated but he follows this at home and is typically normal. Continue present medications.

## 2011-12-28 ENCOUNTER — Ambulatory Visit (HOSPITAL_COMMUNITY): Payer: Medicare Other | Attending: Cardiology

## 2011-12-28 ENCOUNTER — Other Ambulatory Visit: Payer: Self-pay

## 2011-12-28 DIAGNOSIS — I359 Nonrheumatic aortic valve disorder, unspecified: Secondary | ICD-10-CM | POA: Insufficient documentation

## 2011-12-28 DIAGNOSIS — I079 Rheumatic tricuspid valve disease, unspecified: Secondary | ICD-10-CM | POA: Insufficient documentation

## 2011-12-28 DIAGNOSIS — I059 Rheumatic mitral valve disease, unspecified: Secondary | ICD-10-CM | POA: Insufficient documentation

## 2011-12-28 DIAGNOSIS — Z952 Presence of prosthetic heart valve: Secondary | ICD-10-CM

## 2011-12-28 DIAGNOSIS — I4891 Unspecified atrial fibrillation: Secondary | ICD-10-CM

## 2011-12-28 DIAGNOSIS — I10 Essential (primary) hypertension: Secondary | ICD-10-CM | POA: Insufficient documentation

## 2012-04-01 ENCOUNTER — Other Ambulatory Visit (INDEPENDENT_AMBULATORY_CARE_PROVIDER_SITE_OTHER): Payer: Medicare Other

## 2012-04-01 ENCOUNTER — Other Ambulatory Visit: Payer: Self-pay | Admitting: Family Medicine

## 2012-04-01 DIAGNOSIS — I10 Essential (primary) hypertension: Secondary | ICD-10-CM

## 2012-04-01 DIAGNOSIS — M109 Gout, unspecified: Secondary | ICD-10-CM

## 2012-04-01 DIAGNOSIS — N529 Male erectile dysfunction, unspecified: Secondary | ICD-10-CM

## 2012-04-01 DIAGNOSIS — R0989 Other specified symptoms and signs involving the circulatory and respiratory systems: Secondary | ICD-10-CM

## 2012-04-01 DIAGNOSIS — Z125 Encounter for screening for malignant neoplasm of prostate: Secondary | ICD-10-CM

## 2012-04-01 DIAGNOSIS — K219 Gastro-esophageal reflux disease without esophagitis: Secondary | ICD-10-CM

## 2012-04-01 LAB — BASIC METABOLIC PANEL
BUN: 20 mg/dL (ref 6–23)
CO2: 30 mEq/L (ref 19–32)
Calcium: 9.2 mg/dL (ref 8.4–10.5)
Chloride: 103 mEq/L (ref 96–112)
Creatinine, Ser: 1 mg/dL (ref 0.4–1.5)
GFR: 80.34 mL/min (ref >60.00)
Glucose, Bld: 99 mg/dL (ref 70–99)
Potassium: 4.2 mEq/L (ref 3.5–5.1)
Sodium: 140 mEq/L (ref 135–145)

## 2012-04-01 LAB — HEPATIC FUNCTION PANEL
ALT: 14 U/L (ref 0–53)
AST: 18 U/L (ref 0–37)
Albumin: 4.2 g/dL (ref 3.5–5.2)
Alkaline Phosphatase: 70 U/L (ref 39–117)
Bilirubin, Direct: 0.1 mg/dL (ref 0.0–0.3)
Total Bilirubin: 0.7 mg/dL (ref 0.3–1.2)
Total Protein: 6.9 g/dL (ref 6.0–8.3)

## 2012-04-01 LAB — POCT URINALYSIS DIPSTICK
Bilirubin, UA: NEGATIVE
Blood, UA: NEGATIVE
Glucose, UA: NEGATIVE
Ketones, UA: NEGATIVE
Leukocytes, UA: NEGATIVE
Nitrite, UA: NEGATIVE
Protein, UA: NEGATIVE
Spec Grav, UA: 1.015
Urobilinogen, UA: 0.2
pH, UA: 6

## 2012-04-01 LAB — LIPID PANEL
Cholesterol: 180 mg/dL (ref 0–200)
HDL: 43.8 mg/dL (ref >39.00)
LDL Cholesterol: 109 mg/dL — ABNORMAL HIGH (ref 0–99)
Total CHOL/HDL Ratio: 4
Triglycerides: 137 mg/dL (ref 0.0–149.0)
VLDL: 27.4 mg/dL (ref 0.0–40.0)

## 2012-04-01 LAB — CBC
HCT: 39.8 % (ref 39.0–52.0)
Hemoglobin: 13.3 g/dL (ref 13.0–17.0)
MCHC: 33.4 g/dL (ref 30.0–36.0)
MCV: 97.6 fl (ref 78.0–100.0)
Platelets: 199 10*3/uL (ref 150.0–400.0)
RBC: 4.08 Mil/uL — ABNORMAL LOW (ref 4.22–5.81)
RDW: 14.5 % (ref 11.5–14.6)
WBC: 5.4 10*3/uL (ref 4.5–10.5)

## 2012-04-01 LAB — TSH: TSH: 0.53 u[IU]/mL (ref 0.35–5.50)

## 2012-04-01 LAB — PSA: PSA: 2.29 ng/mL (ref 0.10–4.00)

## 2012-04-08 ENCOUNTER — Ambulatory Visit (INDEPENDENT_AMBULATORY_CARE_PROVIDER_SITE_OTHER): Payer: Medicare Other | Admitting: Family Medicine

## 2012-04-08 ENCOUNTER — Encounter: Payer: Self-pay | Admitting: Family Medicine

## 2012-04-08 VITALS — BP 130/80 | Temp 98.0°F | Ht 66.75 in | Wt 178.0 lb

## 2012-04-08 DIAGNOSIS — I359 Nonrheumatic aortic valve disorder, unspecified: Secondary | ICD-10-CM

## 2012-04-08 DIAGNOSIS — M109 Gout, unspecified: Secondary | ICD-10-CM

## 2012-04-08 DIAGNOSIS — R0989 Other specified symptoms and signs involving the circulatory and respiratory systems: Secondary | ICD-10-CM

## 2012-04-08 DIAGNOSIS — I4891 Unspecified atrial fibrillation: Secondary | ICD-10-CM

## 2012-04-08 DIAGNOSIS — F411 Generalized anxiety disorder: Secondary | ICD-10-CM

## 2012-04-08 DIAGNOSIS — N529 Male erectile dysfunction, unspecified: Secondary | ICD-10-CM

## 2012-04-08 DIAGNOSIS — I10 Essential (primary) hypertension: Secondary | ICD-10-CM

## 2012-04-08 DIAGNOSIS — F419 Anxiety disorder, unspecified: Secondary | ICD-10-CM

## 2012-04-08 MED ORDER — ALLOPURINOL 300 MG PO TABS: 300.0000 mg | ORAL_TABLET | Freq: Every day | ORAL | Status: DC

## 2012-04-08 MED ORDER — ZOLPIDEM TARTRATE 5 MG PO TABS: ORAL_TABLET | ORAL | Status: DC

## 2012-04-08 MED ORDER — AMLODIPINE BESYLATE 5 MG PO TABS: 5.0000 mg | ORAL_TABLET | Freq: Every day | ORAL | Status: DC

## 2012-04-08 NOTE — Progress Notes (Signed)
Subjective:    Patient ID: [name], male    DOB: 1935-09-29, 76 y.o.   MRN: [id number]  HPI Lenton is a 76 year old male nonsmoker who comes in today for a Medicare wellness examination  He has a history of gout and he takes allopurinol 300 mg daily  Has a history of hypertension for which he takes Norvasc 5 mg daily BP 130/80  He's had an aortic valve replacement and is on.pradaxa 150 mg twice daily  This past October he had his right hip replaced and he is due to have his left knee replaced this coming fall. He's also had his shoulder replaced.  He had polyps injected it [facility] 4 years ago he still has a hoarseness to his voice which they say is uncorrectable  Atrial fib is unchanged.  Cognitive function normal he walks 4 miles a day and swims daily in his backyard pool. Pulmonal safety reviewed no issues identified, no guns in the house, he does have a health care power of attorney and living well.  Tetanus booster 2005 Pneumovax x2 information given on shingles   Review of Systems  Constitutional: Negative.   HENT: Negative.   Eyes: Negative.   Respiratory: Negative.   Cardiovascular: Negative.   Gastrointestinal: Negative.   Genitourinary: Negative.   Musculoskeletal: Negative.   Skin: Negative.   Neurological: Negative.   Hematological: Negative.   Psychiatric/Behavioral: Negative.        Objective:   Physical Exam  Constitutional: He is oriented to person, place, and time. He appears well-developed and well-nourished.  HENT:  Head: Normocephalic and atraumatic.  Right Ear: External ear normal.  Left Ear: External ear normal.  Nose: Nose normal.  Mouth/Throat: Oropharynx is clear and moist.  Eyes: Conjunctivae and EOM are normal. Pupils are equal, round, and reactive to light.  Neck: Normal range of motion. Neck supple. No JVD present. No tracheal deviation present. No thyromegaly present.  Cardiovascular: Intact distal pulses.  Exam reveals no  gallop and no friction rub.   Murmur heard.      He has a midline incision from previous sternotomy for aortic valve replacement. Murmur of a S. and also right carotid bruit unchanged pulse is 70 and irregular consistent with chronic AF  Pulmonary/Chest: Effort normal and breath sounds normal. No stridor. No respiratory distress. He has no wheezes. He has no rales. He exhibits no tenderness.  Abdominal: Soft. Bowel sounds are normal. He exhibits no distension and no mass. There is no tenderness. There is no rebound and no guarding.  Genitourinary: Rectum normal, prostate normal and penis normal. Guaiac negative stool. No penile tenderness.  Musculoskeletal: Normal range of motion. He exhibits no edema and no tenderness.       Scar left shoulder from previous shoulder replacement also scar right hip from recent hip surgery left knee is enlarged consistent with chronic degenerative joint disease he's due to have this replaced next fall  Lymphadenopathy:    He has no cervical adenopathy.  Neurological: He is alert and oriented to person, place, and time. He has normal reflexes. No cranial nerve deficit. He exhibits normal muscle tone.  Skin: Skin is warm and dry. No rash noted. No erythema. No pallor.  Psychiatric: He has a normal mood and affect. His behavior is normal. Judgment and thought content normal.          Assessment & Plan:  Healthy male  History of gout continue allopurinol  History of hypertension continue Norvasc  History  of aortic valve replacement and atrial fib. Continued the blood thinner 150  twice a day  Sleep dysfunction decrease Ambien to 5 mg recommended dose one half tab each bedtime when necessary and no more  Status post right hip replacement  Status post left shoulder surgery  History of vocal polyps and chronic hoarseness

## 2012-04-08 NOTE — Patient Instructions (Signed)
Continue your current medications  Followup in 1 year sooner if any problems 

## 2012-06-10 ENCOUNTER — Other Ambulatory Visit: Payer: Self-pay | Admitting: Cardiology

## 2012-06-12 ENCOUNTER — Other Ambulatory Visit: Payer: Self-pay | Admitting: Family Medicine

## 2012-06-12 ENCOUNTER — Telehealth: Payer: Self-pay | Admitting: Family Medicine

## 2012-06-12 NOTE — Telephone Encounter (Signed)
Rx called in 

## 2012-06-12 NOTE — Telephone Encounter (Signed)
Patient called stating that the pharmacy contacted him stating that they have tried calling the office for clarification of his zolpidem refill with no call back. Please call [facility] so that patient may get his meds and inform patient when done.

## 2012-08-18 ENCOUNTER — Emergency Department (HOSPITAL_COMMUNITY)
Admission: EM | Admit: 2012-08-18 | Discharge: 2012-08-18 | Disposition: A | Discharge status: Home / Self Care | Payer: Medicare Other | Source: Home / Self Care | Attending: Emergency Medicine | Admitting: Emergency Medicine

## 2012-08-18 ENCOUNTER — Encounter (HOSPITAL_COMMUNITY): Payer: Self-pay | Admitting: *Deleted

## 2012-08-18 DIAGNOSIS — J069 Acute upper respiratory infection, unspecified: Secondary | ICD-10-CM

## 2012-08-18 MED ORDER — LORATADINE 10 MG PO TABS: 10.0000 mg | ORAL_TABLET | Freq: Every day | ORAL | Status: DC

## 2012-08-18 MED ORDER — AZITHROMYCIN 250 MG PO TABS: ORAL_TABLET | ORAL | Status: DC

## 2012-08-18 MED ORDER — GUAIFENESIN-CODEINE 100-10 MG/5ML PO SYRP: 5.0000 mL | ORAL_SOLUTION | Freq: Three times a day (TID) | ORAL | Status: DC | PRN

## 2012-08-18 NOTE — ED Notes (Addendum)
Patient complains of head and chest congestion, cough with green/yellow phlegm x 5 days. Patient denies nausea, vomiting, fever/chills, diarrhea, shortness of breath. Patient states if given prescription he would rather have written prescription rather than faxed.

## 2012-08-18 NOTE — ED Provider Notes (Signed)
History     CSN: [id number]  Arrival date & time 08/18/12  1112   First MD Initiated Contact with Patient 08/18/12 1202      Chief Complaint  Patient presents with  . URI    (Consider location/radiation/quality/duration/timing/severity/associated sxs/prior treatment) Patient is a 76 y.o. male presenting with URI. The history is provided by the patient.  URI The primary symptoms include fatigue and cough. The current episode started 2 days ago. The problem has not changed since onset. The cough is productive. The sputum is clear and white.  The onset of the illness is associated with exposure to sick contacts. Symptoms associated with the illness include chills and congestion. The following treatments were addressed: Acetaminophen: alka seltzer plus. Risk factors for severe complications from URI include being elderly.    Past Medical History  Diagnosis Date  . HTN (hypertension)   . Atrial fibrillation   . Aortic stenosis   . Erectile dysfunction   . Vertigo   . Gout   . GERD (gastroesophageal reflux disease)   . DJD (degenerative joint disease)   . MVP (mitral valve prolapse)   . Hernia   . Baker cyst     Past Surgical History  Procedure Date  . Hernia repair   . Cartilage surgery   . Hernia repair   . Aortic valve replacement   . Right total hip   .  polyps vocal cord     Family History  Problem Relation Age of Onset  . Diabetes    . Stroke      History  Substance Use Topics  . Smoking status: Former Games developer  . Smokeless tobacco: Not on file     Comment: quit appox 6 years ago  . Alcohol Use: Yes      Review of Systems  Constitutional: Positive for chills and fatigue.  HENT: Positive for congestion and voice change.   Respiratory: Positive for cough.   All other systems reviewed and are negative.    Allergies  Review of patient's allergies indicates no known allergies.  Home Medications   Current Outpatient Rx  Name  Route  Sig  Dispense   Refill  . ALLOPURINOL 300 MG PO TABS   Oral   Take 1 tablet (300 mg total) by mouth daily.   100 tablet   3   . AMLODIPINE BESYLATE 5 MG PO TABS   Oral   Take 1 tablet (5 mg total) by mouth daily.   100 tablet   3   . AZITHROMYCIN 250 MG PO TABS      Azithromycin 500mg  on day 1, then 250mg  on days 2-4   6 tablet   0   . GUAIFENESIN-CODEINE 100-10 MG/5ML PO SYRP   Oral   Take 5 mLs by mouth 3 (three) times daily as needed for cough.   120 mL   0   . LORATADINE 10 MG PO TABS   Oral   Take 1 tablet (10 mg total) by mouth daily.   30 tablet   2   . PRADAXA 150 MG PO CAPS      TAKE ONE CAPSULE BY MOUTH TWICE DAILY   60 capsule   11   . PREDNISONE 20 MG PO TABS      2 tabs x3 days, 1 tab x3 days, a half a tab x3 days, then a half a tablet Monday Wednesday Friday for a 2 week taper   40 tablet   1   .  ZOLPIDEM TARTRATE 10 MG PO TABS      TAKE ONE TABLET BY MOUTH AT BEDTIME AS NEEDED   90 tablet   0   . ZOLPIDEM TARTRATE 5 MG PO TABS      One half tab each bedtime when necessary   30 tablet   4     BP 151/83  Pulse 91  Temp 98.5 F (36.9 C) (Oral)  Resp 14  SpO2 100%  Physical Exam  Nursing note and vitals reviewed. Constitutional: He is oriented to person, place, and time. Vital signs are normal. He appears well-developed and well-nourished. He is active and cooperative.  HENT:  Head: Normocephalic.  Right Ear: External ear normal. Tympanic membrane is scarred.  Left Ear: External ear normal. Tympanic membrane is scarred.  Nose: Nose normal. Right sinus exhibits no maxillary sinus tenderness and no frontal sinus tenderness. Left sinus exhibits no maxillary sinus tenderness and no frontal sinus tenderness.  Mouth/Throat: Uvula is midline and mucous membranes are normal. Posterior oropharyngeal erythema present. No oropharyngeal exudate or posterior oropharyngeal edema.  Eyes: Conjunctivae normal are normal. Pupils are equal, round, and reactive to  light. No scleral icterus.  Neck: Trachea normal, normal range of motion and full passive range of motion without pain. Neck supple.  Cardiovascular: Normal rate, intact distal pulses and normal pulses.  An irregular rhythm present.  Murmur heard. Pulmonary/Chest: Effort normal and breath sounds normal.  Abdominal: Soft. Bowel sounds are normal. There is no tenderness.  Lymphadenopathy:       Head (right side): No submental, no submandibular, no tonsillar, no preauricular, no posterior auricular and no occipital adenopathy present.       Head (left side): No submental, no submandibular, no tonsillar, no preauricular, no posterior auricular and no occipital adenopathy present.    He has no cervical adenopathy.  Neurological: He is alert and oriented to person, place, and time. He has normal strength. No cranial nerve deficit or sensory deficit. GCS eye subscore is 4. GCS verbal subscore is 5. GCS motor subscore is 6.  Skin: Skin is warm and dry. No rash noted.  Psychiatric: He has a normal mood and affect. His speech is normal and behavior is normal. Judgment and thought content normal. Cognition and memory are normal.    ED Course  Procedures (including critical care time)  Labs Reviewed - No data to display No results found.   1. URI (upper respiratory infection)       MDM  Increase fluid intake, rest.  Begin Azithromycin.  Begin expectorant/decongestant, topical decongestant, saline nasal spray and/or saline irrigation, and cough suppressant at bedtime. Antihistamines of your choice (Claritin or Zyrtec).  Tylenol or Motrin for fever/discomfort.  Followup with PCP if not improving 3 to 5 days.         Johnsie Kindred, NP 08/18/12 1216

## 2012-08-18 NOTE — ED Provider Notes (Signed)
Medical screening examination/treatment/procedure(s) were performed by non-physician practitioner and as supervising physician I was immediately available for consultation/collaboration.  Jakirah Zaun, M.D.   Caton Popowski C Baylea Milburn, MD 08/18/12 2013 

## 2012-08-19 ENCOUNTER — Telehealth: Payer: Self-pay | Admitting: Family Medicine

## 2012-08-19 NOTE — Telephone Encounter (Signed)
[facility] Triage Call Report Triage Record Num: [id number] Operator: [name] Patient Name: [name] Call Date & Time: 08/17/2012 9:44:06PM Patient Phone: [phone] PCP: Tera Mater. Clent Ridges Patient Gender: Male PCP Fax : [phone] Patient DOB: 09/24/1935 [facility] Name: [facility]  Reason for Call: Caller: [place]/Patient; PCP: Gershon Crane [facility]); CB#: [phone]; Call regarding Cough/Congestion; Afebrile; Onset: 08/14/12; Sx notes: Head congestion and cough, has progressed to increased congested, coughing up yellow sputum; Guideline used: Cough; Disposition: See provider within 24 hours due to productive cough with colored sputum; Appt. made: No; Patient is going to [facility] on 08/18/12.  Protocol(s) Used: Cough - Adult Recommended Outcome per Protocol: See Provider within 24 hours Reason for Outcome: Productive cough with colored sputum (other than clear or white sputum)

## 2012-08-30 ENCOUNTER — Other Ambulatory Visit: Payer: Self-pay | Admitting: Family Medicine

## 2012-08-31 ENCOUNTER — Other Ambulatory Visit: Payer: Self-pay | Admitting: Family Medicine

## 2012-10-19 ENCOUNTER — Other Ambulatory Visit: Payer: Self-pay | Admitting: Family Medicine

## 2012-10-21 ENCOUNTER — Other Ambulatory Visit: Payer: Self-pay | Admitting: Family Medicine

## 2012-12-19 ENCOUNTER — Ambulatory Visit: Payer: Medicare Other | Admitting: Cardiology

## 2012-12-24 ENCOUNTER — Encounter: Payer: Self-pay | Admitting: Cardiology

## 2012-12-24 ENCOUNTER — Ambulatory Visit (INDEPENDENT_AMBULATORY_CARE_PROVIDER_SITE_OTHER): Payer: Medicare Other | Admitting: Cardiology

## 2012-12-24 VITALS — BP 130/80 | HR 82 | Ht 69.0 in | Wt 176.0 lb

## 2012-12-24 DIAGNOSIS — Z954 Presence of other heart-valve replacement: Secondary | ICD-10-CM

## 2012-12-24 DIAGNOSIS — IMO0001 Reserved for inherently not codable concepts without codable children: Secondary | ICD-10-CM

## 2012-12-24 DIAGNOSIS — I712 Thoracic aortic aneurysm, without rupture: Secondary | ICD-10-CM

## 2012-12-24 DIAGNOSIS — I4891 Unspecified atrial fibrillation: Secondary | ICD-10-CM

## 2012-12-24 DIAGNOSIS — Z952 Presence of prosthetic heart valve: Secondary | ICD-10-CM

## 2012-12-24 LAB — CBC WITH DIFFERENTIAL/PLATELET
Basophils Absolute: 0 10*3/uL (ref 0.0–0.1)
Basophils Relative: 0.3 % (ref 0.0–3.0)
Eosinophils Absolute: 0 10*3/uL (ref 0.0–0.7)
Eosinophils Relative: 0.5 % (ref 0.0–5.0)
HCT: 39.9 % (ref 39.0–52.0)
Hemoglobin: 13.6 g/dL (ref 13.0–17.0)
Lymphocytes Relative: 11.4 % — ABNORMAL LOW (ref 12.0–46.0)
Lymphs Abs: 0.8 10*3/uL (ref 0.7–4.0)
MCHC: 34.2 g/dL (ref 30.0–36.0)
MCV: 96.8 fl (ref 78.0–100.0)
Monocytes Absolute: 0.3 10*3/uL (ref 0.1–1.0)
Monocytes Relative: 4.5 % (ref 3.0–12.0)
Neutro Abs: 6 10*3/uL (ref 1.4–7.7)
Neutrophils Relative %: 83.3 % — ABNORMAL HIGH (ref 43.0–77.0)
Platelets: 174 10*3/uL (ref 150.0–400.0)
RBC: 4.12 Mil/uL — ABNORMAL LOW (ref 4.22–5.81)
RDW: 14.3 % (ref 11.5–14.6)
WBC: 7.2 10*3/uL (ref 4.5–10.5)

## 2012-12-24 LAB — BASIC METABOLIC PANEL
BUN: 16 mg/dL (ref 6–23)
CO2: 30 mEq/L (ref 19–32)
Calcium: 9.3 mg/dL (ref 8.4–10.5)
Chloride: 101 mEq/L (ref 96–112)
Creatinine, Ser: 1 mg/dL (ref 0.4–1.5)
GFR: 78.31 mL/min (ref >60.00)
Glucose, Bld: 95 mg/dL (ref 70–99)
Potassium: 3.5 mEq/L (ref 3.5–5.1)
Sodium: 138 mEq/L (ref 135–145)

## 2012-12-24 NOTE — Assessment & Plan Note (Signed)
Blood pressure controlled. Continue present medications. 

## 2012-12-24 NOTE — Assessment & Plan Note (Signed)
Status post aortic valve replacement and aortic root replacement. Some dyspnea. Repeat echocardiogram and CTA of the thoracic aorta. Continue SBE prophylaxis.

## 2012-12-24 NOTE — Assessment & Plan Note (Signed)
Patient's rate is controlled on no medications. Continue pradaxa; check CBC and BMET

## 2012-12-24 NOTE — Patient Instructions (Addendum)
Your physician wants you to follow-up in: ONE YEAR WITH DR Shelda Pal will receive a reminder letter in the mail two months in advance. If you don't receive a letter, please call our office to schedule the follow-up appointment.   Your physician has requested that you have an echocardiogram. Echocardiography is a painless test that uses sound waves to create images of your heart. It provides your doctor with information about the size and shape of your heart and how well your heart's chambers and valves are working. This procedure takes approximately one hour. There are no restrictions for this procedure.   CTA OF THE CHEST W AND W/O CONTRAST TO FOLLOW UP THORACIC ANEURYSM  Your physician recommends that you HAVE LAB WORK TODAY

## 2012-12-24 NOTE — Progress Notes (Signed)
   HPI: [name] is a very pleasant gentleman who has a history of aortic valve replacement secondary to aortic stenosis with a pericardial tissue valve and thoracic aortic aneurysm repair in July 2006. Preoperative cardiac catheterization revealed normal coronary arteries. His last CTA was performed in October of 2009. No aneurysm or dissection. He has a history of atrial fibrillation. Previous carotid Dopplers due to bruits revealed 0-39% bilateral stenosis. A monitor in August of 2011showed occasional 3.1 second pause. Tenoretic was discontinued and Norvasc added for his blood pressure. Atrial fibrillation is now being treated with rate control and anticoagulation. Echo in May of 2013 showed normal LV function, bioprosthetic AVR with mild AS and trace AI; mild biatrial enlargement. I last saw him in April 2013. Since then, the patient has dyspnea with more extreme activities but not with routine activities. It is relieved with rest. It is not associated with chest pain. There is no orthopnea, PND or pedal edema. There is no syncope or palpitations. There is no exertional chest pain.    Current Outpatient Prescriptions  Medication Sig Dispense Refill  . allopurinol (ZYLOPRIM) 300 MG tablet Take 1 tablet (300 mg total) by mouth daily.  100 tablet  3  . amLODipine (NORVASC) 5 MG tablet Take 1 tablet (5 mg total) by mouth daily.  100 tablet  3  . PRADAXA 150 MG CAPS TAKE ONE CAPSULE BY MOUTH TWICE DAILY  60 capsule  11  . zolpidem (AMBIEN) 10 MG tablet TAKE ONE TABLET BY MOUTH AT BEDTIME AS NEEDED  90 tablet  0   No current facility-administered medications for this visit.     Past Medical History  Diagnosis Date  . HTN (hypertension)   . Atrial fibrillation   . Aortic stenosis   . Erectile dysfunction   . Vertigo   . Gout   . GERD (gastroesophageal reflux disease)   . DJD (degenerative joint disease)   . MVP (mitral valve prolapse)   . Hernia   . Baker cyst     Past Surgical History   Procedure Laterality Date  . Hernia repair    . Cartilage surgery    . Hernia repair    . Aortic valve replacement    . Right total hip    .  polyps vocal cord      History   Social History  . Marital Status: Divorced    Spouse Name: N/A    Number of Children: N/A  . Years of Education: N/A   Occupational History  . Not on file.   Social History Main Topics  . Smoking status: Former Games developer  . Smokeless tobacco: Not on file     Comment: quit appox 6 years ago  . Alcohol Use: Yes  . Drug Use: No  . Sexually Active: Not on file   Other Topics Concern  . Not on file   Social History Narrative  . No narrative on file    ROS: no fevers or chills, productive cough, hemoptysis, dysphasia, odynophagia, melena, hematochezia, dysuria, hematuria, rash, seizure activity, orthopnea, PND, pedal edema, claudication. Remaining systems are negative.  Physical Exam: Well-developed well-nourished in no acute distress.  Skin is warm and dry.  HEENT is normal.  Neck is supple.  Chest is clear to auscultation with normal expansion.  Cardiovascular exam is irregular, 2/6 systolic murmur left sternal border. Abdominal exam nontender or distended. No masses palpated. Extremities show no edema. neuro grossly intact  ECG Atrial fibrillation

## 2012-12-25 ENCOUNTER — Ambulatory Visit (INDEPENDENT_AMBULATORY_CARE_PROVIDER_SITE_OTHER)
Admission: RE | Admit: 2012-12-25 | Discharge: 2012-12-25 | Disposition: A | Discharge status: Home / Self Care | Payer: Medicare Other | Source: Ambulatory Visit | Attending: Cardiology | Admitting: Cardiology

## 2012-12-25 ENCOUNTER — Ambulatory Visit (HOSPITAL_COMMUNITY): Payer: Medicare Other | Attending: Cardiovascular Disease | Admitting: Radiology

## 2012-12-25 DIAGNOSIS — I4891 Unspecified atrial fibrillation: Secondary | ICD-10-CM

## 2012-12-25 DIAGNOSIS — IMO0001 Reserved for inherently not codable concepts without codable children: Secondary | ICD-10-CM

## 2012-12-25 DIAGNOSIS — Z952 Presence of prosthetic heart valve: Secondary | ICD-10-CM

## 2012-12-25 DIAGNOSIS — R011 Cardiac murmur, unspecified: Secondary | ICD-10-CM | POA: Insufficient documentation

## 2012-12-25 DIAGNOSIS — I712 Thoracic aortic aneurysm, without rupture, unspecified: Secondary | ICD-10-CM

## 2012-12-25 DIAGNOSIS — Z09 Encounter for follow-up examination after completed treatment for conditions other than malignant neoplasm: Secondary | ICD-10-CM | POA: Insufficient documentation

## 2012-12-25 DIAGNOSIS — Z954 Presence of other heart-valve replacement: Secondary | ICD-10-CM | POA: Insufficient documentation

## 2012-12-25 DIAGNOSIS — I059 Rheumatic mitral valve disease, unspecified: Secondary | ICD-10-CM

## 2012-12-25 MED ORDER — IOHEXOL 350 MG/ML SOLN
100.0000 mL | Freq: Once | INTRAVENOUS | Status: AC | PRN
  Administered 2012-12-25: 100 mL via INTRAVENOUS

## 2012-12-25 NOTE — Progress Notes (Signed)
Echocardiogram performed.  

## 2013-02-17 ENCOUNTER — Other Ambulatory Visit: Payer: Self-pay | Admitting: *Deleted

## 2013-02-17 MED ORDER — PREDNISONE 20 MG PO TABS: 20.0000 mg | ORAL_TABLET | Freq: Every day | ORAL | Status: DC

## 2013-04-01 ENCOUNTER — Other Ambulatory Visit: Payer: Medicare Other

## 2013-04-02 ENCOUNTER — Other Ambulatory Visit: Payer: Self-pay | Admitting: Family Medicine

## 2013-04-08 ENCOUNTER — Encounter: Payer: Medicare Other | Admitting: Family Medicine

## 2013-05-08 ENCOUNTER — Other Ambulatory Visit: Payer: Self-pay | Admitting: Family Medicine

## 2013-05-26 ENCOUNTER — Other Ambulatory Visit: Payer: Self-pay | Admitting: *Deleted

## 2013-05-26 MED ORDER — PREDNISONE 20 MG PO TABS: 20.0000 mg | ORAL_TABLET | Freq: Every day | ORAL | Status: DC

## 2013-06-03 ENCOUNTER — Other Ambulatory Visit (INDEPENDENT_AMBULATORY_CARE_PROVIDER_SITE_OTHER): Payer: Medicare Other

## 2013-06-03 DIAGNOSIS — Z0000 Encounter for general adult medical examination without abnormal findings: Secondary | ICD-10-CM

## 2013-06-03 DIAGNOSIS — I10 Essential (primary) hypertension: Secondary | ICD-10-CM

## 2013-06-03 LAB — CBC WITH DIFFERENTIAL/PLATELET
Basophils Absolute: 0 10*3/uL (ref 0.0–0.1)
Basophils Relative: 0.3 % (ref 0.0–3.0)
Eosinophils Absolute: 0.1 10*3/uL (ref 0.0–0.7)
Eosinophils Relative: 1.7 % (ref 0.0–5.0)
HCT: 34.6 % — ABNORMAL LOW (ref 39.0–52.0)
Hemoglobin: 11.7 g/dL — ABNORMAL LOW (ref 13.0–17.0)
Lymphocytes Relative: 19.6 % (ref 12.0–46.0)
Lymphs Abs: 1.3 10*3/uL (ref 0.7–4.0)
MCHC: 33.9 g/dL (ref 30.0–36.0)
MCV: 93.4 fl (ref 78.0–100.0)
Monocytes Absolute: 0.6 10*3/uL (ref 0.1–1.0)
Monocytes Relative: 9.1 % (ref 3.0–12.0)
Neutro Abs: 4.4 10*3/uL (ref 1.4–7.7)
Neutrophils Relative %: 69.3 % (ref 43.0–77.0)
Platelets: 249 10*3/uL (ref 150.0–400.0)
RBC: 3.7 Mil/uL — ABNORMAL LOW (ref 4.22–5.81)
RDW: 14.2 % (ref 11.5–14.6)
WBC: 6.4 10*3/uL (ref 4.5–10.5)

## 2013-06-03 LAB — POCT URINALYSIS DIPSTICK
Bilirubin, UA: NEGATIVE
Blood, UA: NEGATIVE
Glucose, UA: NEGATIVE
Ketones, UA: NEGATIVE
Leukocytes, UA: NEGATIVE
Nitrite, UA: NEGATIVE
Protein, UA: NEGATIVE
Spec Grav, UA: 1.025
Urobilinogen, UA: 0.2
pH, UA: 5

## 2013-06-03 LAB — BASIC METABOLIC PANEL
BUN: 19 mg/dL (ref 6–23)
CO2: 29 mEq/L (ref 19–32)
Calcium: 9.1 mg/dL (ref 8.4–10.5)
Chloride: 104 mEq/L (ref 96–112)
Creatinine, Ser: 1.1 mg/dL (ref 0.4–1.5)
GFR: 71.51 mL/min (ref >60.00)
Glucose, Bld: 83 mg/dL (ref 70–99)
Potassium: 4.2 mEq/L (ref 3.5–5.1)
Sodium: 141 mEq/L (ref 135–145)

## 2013-06-03 LAB — LIPID PANEL
Cholesterol: 174 mg/dL (ref 0–200)
HDL: 46.9 mg/dL (ref >39.00)
LDL Cholesterol: 113 mg/dL — ABNORMAL HIGH (ref 0–99)
Total CHOL/HDL Ratio: 4
Triglycerides: 73 mg/dL (ref 0.0–149.0)
VLDL: 14.6 mg/dL (ref 0.0–40.0)

## 2013-06-03 LAB — HEPATIC FUNCTION PANEL
ALT: 14 U/L (ref 0–53)
AST: 20 U/L (ref 0–37)
Albumin: 4 g/dL (ref 3.5–5.2)
Alkaline Phosphatase: 57 U/L (ref 39–117)
Bilirubin, Direct: 0.1 mg/dL (ref 0.0–0.3)
Total Bilirubin: 0.7 mg/dL (ref 0.3–1.2)
Total Protein: 6.3 g/dL (ref 6.0–8.3)

## 2013-06-03 LAB — TSH: TSH: 0.33 u[IU]/mL — ABNORMAL LOW (ref 0.35–5.50)

## 2013-06-03 LAB — PSA: PSA: 3.39 ng/mL (ref 0.10–4.00)

## 2013-06-10 ENCOUNTER — Encounter: Payer: Self-pay | Admitting: Family Medicine

## 2013-06-10 ENCOUNTER — Ambulatory Visit (INDEPENDENT_AMBULATORY_CARE_PROVIDER_SITE_OTHER): Payer: Medicare Other | Admitting: Family Medicine

## 2013-06-10 ENCOUNTER — Telehealth: Payer: Self-pay | Admitting: Cardiology

## 2013-06-10 VITALS — BP 140/80 | Temp 98.0°F | Ht 66.25 in | Wt 178.0 lb

## 2013-06-10 DIAGNOSIS — M1990 Unspecified osteoarthritis, unspecified site: Secondary | ICD-10-CM

## 2013-06-10 DIAGNOSIS — R0989 Other specified symptoms and signs involving the circulatory and respiratory systems: Secondary | ICD-10-CM

## 2013-06-10 DIAGNOSIS — K219 Gastro-esophageal reflux disease without esophagitis: Secondary | ICD-10-CM

## 2013-06-10 DIAGNOSIS — I359 Nonrheumatic aortic valve disorder, unspecified: Secondary | ICD-10-CM

## 2013-06-10 DIAGNOSIS — M109 Gout, unspecified: Secondary | ICD-10-CM

## 2013-06-10 DIAGNOSIS — I4891 Unspecified atrial fibrillation: Secondary | ICD-10-CM

## 2013-06-10 DIAGNOSIS — L509 Urticaria, unspecified: Secondary | ICD-10-CM

## 2013-06-10 DIAGNOSIS — I10 Essential (primary) hypertension: Secondary | ICD-10-CM

## 2013-06-10 MED ORDER — PREDNISONE 20 MG PO TABS: ORAL_TABLET | ORAL | Status: DC

## 2013-06-10 MED ORDER — AMLODIPINE BESYLATE 5 MG PO TABS: ORAL_TABLET | ORAL | Status: DC

## 2013-06-10 MED ORDER — ALLOPURINOL 300 MG PO TABS: ORAL_TABLET | ORAL | Status: DC

## 2013-06-10 MED ORDER — ZOLPIDEM TARTRATE 5 MG PO TABS: ORAL_TABLET | ORAL | Status: DC

## 2013-06-10 NOTE — Telephone Encounter (Signed)
The pt was evaluated today by Dr Tawanna Cooler for physical.  I reviewed his note and it states for pt to continue pradaxa per cardiology. I did not see any comments about contacting Cardiology in regards to 06/03/13 Hemoglobin 11.7. I made the pt aware that he should continue Pradaxa at this time.  The pt states that Dr Tawanna Cooler instructed him to start Iron and take this for 3 months.  I will forward this message to Dr Jens Som to determine if he has any other recommendations.

## 2013-06-10 NOTE — Telephone Encounter (Signed)
New message   Had phy today with pcp. Hgb is low--PCP told him to talk to doc or nurse because he is on prodaxa.

## 2013-06-10 NOTE — Patient Instructions (Signed)
Ambien 5 mg,,,,,,,,,, the recommended dose now is one half tab at bedtime when necessary  Continue your other medications  Continue exercise program  Followup in 1 year sooner if any problem

## 2013-06-10 NOTE — Progress Notes (Signed)
  Subjective:    Patient ID: [name], male    DOB: [birth year], 77 y.o.   MRN: [id number]  HPI Antonius is a 77 year old male remarried,,,,,,,, his first wife died 21 years ago pancreatic cancer,,,,,, who comes in today for a Medicare wellness examination  His meds reviewed in the been no changes. He says overall he feels well.  He gets routine eye care, dental care, colonoscopy and GI, vaccinations up-to-date seasonal flu shot given today information given on shingles  Home cognitive function normal he exercises on a regular basis home health safety reviewed no issues identified, no guns in the house, he does have a health care power of attorney and living well  Cardiac-wise he's asymptomatic he is followed by Dr. Jens Som. He's currently on Pradaxa because of chronic AF. He's had his aortic valve replaced he does have mitral valve disease. However he says his exercise tolerance is normal   Review of Systems  Constitutional: Negative.   HENT: Negative.   Eyes: Negative.   Respiratory: Negative.   Cardiovascular: Negative.   Gastrointestinal: Negative.   Endocrine: Negative.   Genitourinary: Negative.   Musculoskeletal: Negative.   Skin: Negative.   Allergic/Immunologic: Negative.   Neurological: Negative.   Hematological: Negative.   Psychiatric/Behavioral: Negative.        Objective:   Physical Exam  Nursing note and vitals reviewed. Constitutional: He is oriented to person, place, and time. He appears well-developed and well-nourished.  HENT:  Head: Normocephalic and atraumatic.  Right Ear: External ear normal.  Left Ear: External ear normal.  Nose: Nose normal.  Mouth/Throat: Oropharynx is clear and moist.  Eyes: Conjunctivae and EOM are normal. Pupils are equal, round, and reactive to light.  Neck: Normal range of motion. Neck supple. No JVD present. No tracheal deviation present. No thyromegaly present.  Cardiovascular: Normal rate, normal heart sounds and intact  distal pulses.  Exam reveals no gallop and no friction rub.   No murmur heard.   Rhythm irregular rate 70 Bilateral carotid bruits  And no aortic bruits peripheral extremity pulses 2+ and symmetrical  Pulmonary/Chest: Effort normal and breath sounds normal. No stridor. No respiratory distress. He has no wheezes. He has no rales. He exhibits no tenderness.  Scar midline chest from previous valve replacement  Abdominal: Soft. Bowel sounds are normal. He exhibits no distension and no mass. There is no tenderness. There is no rebound and no guarding.  Genitourinary: Rectum normal, prostate normal and penis normal. Guaiac negative stool. No penile tenderness.  Musculoskeletal: Normal range of motion. He exhibits no edema and no tenderness.  Lymphadenopathy:    He has no cervical adenopathy.  Neurological: He is alert and oriented to person, place, and time. He has normal reflexes. No cranial nerve deficit. He exhibits normal muscle tone.  Skin: Skin is warm and dry. No rash noted. No erythema. No pallor.  Psychiatric: He has a normal mood and affect. His behavior is normal. Judgment and thought content normal.          Assessment & Plan:  Healthy male  History of gout continue allopurinol  Hypertension continue Norvasc 5 mg daily  Chronic atrial fib continue blood thinner as outlined by cardiology  Urticaria etiology unknown continue 10 mg of prednisone Monday Wednesday Friday  Occasional sleep dysfunction Ambien but decrease dose to 5 mg one half tab each bedtime when necessary  Status post aortic valve replacement  Mitral valve disease

## 2013-06-10 NOTE — Telephone Encounter (Signed)
Schedule fuov Tierre Gerard  

## 2013-06-11 NOTE — Telephone Encounter (Signed)
Spoke with pt, Follow up scheduled  

## 2013-06-16 ENCOUNTER — Ambulatory Visit (INDEPENDENT_AMBULATORY_CARE_PROVIDER_SITE_OTHER): Payer: Medicare Other | Admitting: Cardiology

## 2013-06-16 ENCOUNTER — Encounter: Payer: Self-pay | Admitting: Cardiology

## 2013-06-16 VITALS — BP 146/80 | HR 94 | Ht 68.0 in | Wt 175.8 lb

## 2013-06-16 DIAGNOSIS — D649 Anemia, unspecified: Secondary | ICD-10-CM | POA: Insufficient documentation

## 2013-06-16 DIAGNOSIS — I4891 Unspecified atrial fibrillation: Secondary | ICD-10-CM

## 2013-06-16 DIAGNOSIS — I359 Nonrheumatic aortic valve disorder, unspecified: Secondary | ICD-10-CM

## 2013-06-16 DIAGNOSIS — I10 Essential (primary) hypertension: Secondary | ICD-10-CM

## 2013-06-16 NOTE — Assessment & Plan Note (Addendum)
Status post aortic valve replacement. Continue SBE prophylaxis. He will need followup echoes as well as CT scan for thoracic aortic aneurysm repair in the future.

## 2013-06-16 NOTE — Progress Notes (Signed)
HPI: [name] is a very pleasant gentleman who has a history of aortic valve replacement secondary to aortic stenosis with a pericardial tissue valve and thoracic aortic aneurysm repair in July 2006. Preoperative cardiac catheterization revealed normal coronary arteries. He has a history of atrial fibrillation. Previous carotid Dopplers due to bruits revealed 0-39% bilateral stenosis. A monitor in August of 2011showed occasional 3.1 second pause. Tenoretic was discontinued and Norvasc added for his blood pressure. Atrial fibrillation is now being treated with rate control and anticoagulation. Echo in April 2014 showed normal LV function, bioprosthetic aortic valve with mean gradient of 15 mm mercury. The right atrium is moderately dilated. CTA in April of 2014 showed atheromatous thoracic aorta without aneurysm, dissection or stenosis. BUN and creatinine October 2000 1419 and 1.1. Hemoglobin 11.7. Since I last saw him in April of 2014, the patient has dyspnea with more extreme activities but not with routine activities. It is relieved with rest. It is not associated with chest pain. There is no orthopnea, PND or pedal edema. There is no syncope or palpitations. There is no exertional chest pain.     Current Outpatient Prescriptions  Medication Sig Dispense Refill  . allopurinol (ZYLOPRIM) 300 MG tablet TAKE ONE TABLET BY MOUTH EVERY DAY  100 tablet  3  . amLODipine (NORVASC) 5 MG tablet One tab daily  90 tablet  3  . PRADAXA 150 MG CAPS TAKE ONE CAPSULE BY MOUTH TWICE DAILY  60 capsule  11  . predniSONE (DELTASONE) 20 MG tablet One half tab every other day  50 tablet  2  . zolpidem (AMBIEN) 5 MG tablet One half tab each bedtime when necessary for sleep  30 tablet  3   No current facility-administered medications for this visit.     Past Medical History  Diagnosis Date  . HTN (hypertension)   . Atrial fibrillation   . Aortic stenosis   . Erectile dysfunction   . Vertigo   . Gout     . GERD (gastroesophageal reflux disease)   . DJD (degenerative joint disease)   . MVP (mitral valve prolapse)   . Hernia   . Baker cyst     Past Surgical History  Procedure Laterality Date  . Hernia repair    . Cartilage surgery    . Hernia repair    . Aortic valve replacement    . Right total hip    .  polyps vocal cord      History   Social History  . Marital Status: Divorced    Spouse Name: N/A    Number of Children: N/A  . Years of Education: N/A   Occupational History  . Not on file.   Social History Main Topics  . Smoking status: Former Games developer  . Smokeless tobacco: Not on file     Comment: quit appox 6 years ago  . Alcohol Use: Yes  . Drug Use: No  . Sexual Activity: Not on file   Other Topics Concern  . Not on file   Social History Narrative  . No narrative on file    ROS: no fevers or chills, productive cough, hemoptysis, dysphasia, odynophagia, melena, hematochezia, dysuria, hematuria, rash, seizure activity, orthopnea, PND, pedal edema, claudication. Remaining systems are negative.  Physical Exam: Well-developed well-nourished in no acute distress.  Skin is warm and dry.  HEENT is normal.  Neck is supple.  Chest is clear to auscultation with normal expansion.  Cardiovascular exam is irregular,  2/6 systolic murmur left sternal border Abdominal exam nontender or distended. No masses palpated. Extremities show no edema. neuro grossly intact  ECG - atrial fibrillation, RVCD

## 2013-06-16 NOTE — Assessment & Plan Note (Signed)
Patient will followup with primary care for further evaluation of this in the future.

## 2013-06-16 NOTE — Assessment & Plan Note (Signed)
Continue present blood pressure medications. 

## 2013-06-16 NOTE — Assessment & Plan Note (Signed)
Patient remains in atrial fibrillation. Heart rate is controlled on no medications. Given his tissue prosthetic aortic valve I will discontinue pradaxa and begin coumadin (referred to coumadin clinic to make change).

## 2013-06-16 NOTE — Patient Instructions (Signed)
Your physician recommends that you schedule a follow-up appointment WITH [name] TO CHANGE FROM PRADAXA TO WARFARIN  Your physician wants you to follow-up in: 6 MONTHS WITH [name] You will receive a reminder letter in the mail two months in advance. If you don't receive a letter, please call our office to schedule the follow-up appointment.

## 2013-06-23 ENCOUNTER — Telehealth: Payer: Self-pay | Admitting: Family Medicine

## 2013-06-23 MED ORDER — TRAZODONE HCL 50 MG PO TABS: 50.0000 mg | ORAL_TABLET | Freq: Every day | ORAL | Status: DC

## 2013-06-23 NOTE — Telephone Encounter (Signed)
Pt insurance will not pay for zolpidem (AMBIEN) 5 MG tablet Advised pt to try trazodone hcl. Can you call rx in to [facility].

## 2013-06-23 NOTE — Telephone Encounter (Signed)
Trazodone 50 mg #100 directions one by mouth each bedtime refills x2

## 2013-06-23 NOTE — Telephone Encounter (Signed)
Okay to switch to trazodone?

## 2013-06-24 NOTE — Telephone Encounter (Addendum)
Pharm states pt has 9 days left of ambien. He did fill the [place] back in august. Pharm would like to know do you want them to hold for the 9 days so he doesn't take both? Deanne at [facility]

## 2013-06-24 NOTE — Addendum Note (Signed)
Addended by: Kern Reap B on: 06/24/2013 03:17 PM   Modules accepted: Orders

## 2013-06-24 NOTE — Telephone Encounter (Signed)
Spoke with pharmacy and Ambien has been switch to trazodone due to insurance.

## 2013-07-03 ENCOUNTER — Ambulatory Visit (INDEPENDENT_AMBULATORY_CARE_PROVIDER_SITE_OTHER): Payer: Medicare Other | Admitting: General Practice

## 2013-07-03 ENCOUNTER — Encounter: Payer: Medicare Other | Admitting: Pharmacist

## 2013-07-03 DIAGNOSIS — Z954 Presence of other heart-valve replacement: Secondary | ICD-10-CM

## 2013-07-03 DIAGNOSIS — Z952 Presence of prosthetic heart valve: Secondary | ICD-10-CM | POA: Insufficient documentation

## 2013-07-03 DIAGNOSIS — Z7901 Long term (current) use of anticoagulants: Secondary | ICD-10-CM

## 2013-07-03 DIAGNOSIS — I359 Nonrheumatic aortic valve disorder, unspecified: Secondary | ICD-10-CM

## 2013-07-03 DIAGNOSIS — I4891 Unspecified atrial fibrillation: Secondary | ICD-10-CM

## 2013-07-03 MED ORDER — WARFARIN SODIUM 5 MG PO TABS: ORAL_TABLET | ORAL | Status: DC

## 2013-07-10 ENCOUNTER — Encounter (INDEPENDENT_AMBULATORY_CARE_PROVIDER_SITE_OTHER): Payer: Self-pay

## 2013-07-10 ENCOUNTER — Ambulatory Visit (INDEPENDENT_AMBULATORY_CARE_PROVIDER_SITE_OTHER): Payer: Medicare Other | Admitting: *Deleted

## 2013-07-10 DIAGNOSIS — I359 Nonrheumatic aortic valve disorder, unspecified: Secondary | ICD-10-CM

## 2013-07-10 DIAGNOSIS — I4891 Unspecified atrial fibrillation: Secondary | ICD-10-CM

## 2013-07-10 DIAGNOSIS — Z7901 Long term (current) use of anticoagulants: Secondary | ICD-10-CM

## 2013-07-10 DIAGNOSIS — Z954 Presence of other heart-valve replacement: Secondary | ICD-10-CM

## 2013-07-10 LAB — POCT INR: INR: 2.7

## 2013-07-21 ENCOUNTER — Telehealth: Payer: Self-pay | Admitting: Family Medicine

## 2013-07-21 ENCOUNTER — Ambulatory Visit (INDEPENDENT_AMBULATORY_CARE_PROVIDER_SITE_OTHER): Payer: Medicare Other | Admitting: Pharmacist

## 2013-07-21 ENCOUNTER — Telehealth: Payer: Self-pay | Admitting: General Practice

## 2013-07-21 DIAGNOSIS — I359 Nonrheumatic aortic valve disorder, unspecified: Secondary | ICD-10-CM

## 2013-07-21 DIAGNOSIS — Z954 Presence of other heart-valve replacement: Secondary | ICD-10-CM

## 2013-07-21 DIAGNOSIS — Z7901 Long term (current) use of anticoagulants: Secondary | ICD-10-CM

## 2013-07-21 DIAGNOSIS — I4891 Unspecified atrial fibrillation: Secondary | ICD-10-CM

## 2013-07-21 LAB — POCT INR: INR: 4.7

## 2013-07-21 NOTE — Telephone Encounter (Signed)
Returned call to patient and LMOVM to call Bailey Mech, RN @ [facility] or [address].

## 2013-07-21 NOTE — Telephone Encounter (Signed)
Pt would like to start coming here for coum ck. Is that ok? Pt would like to know if its less expensive to ck here than at [facility]?

## 2013-07-22 ENCOUNTER — Telehealth: Payer: Self-pay | Admitting: Cardiology

## 2013-07-22 NOTE — Telephone Encounter (Signed)
appt sche for pt  coum on 12/1.

## 2013-07-28 ENCOUNTER — Ambulatory Visit (INDEPENDENT_AMBULATORY_CARE_PROVIDER_SITE_OTHER): Payer: Medicare Other | Admitting: General Practice

## 2013-07-28 DIAGNOSIS — Z7901 Long term (current) use of anticoagulants: Secondary | ICD-10-CM

## 2013-07-28 DIAGNOSIS — Z954 Presence of other heart-valve replacement: Secondary | ICD-10-CM

## 2013-07-28 DIAGNOSIS — I359 Nonrheumatic aortic valve disorder, unspecified: Secondary | ICD-10-CM

## 2013-07-28 DIAGNOSIS — I4891 Unspecified atrial fibrillation: Secondary | ICD-10-CM

## 2013-07-28 LAB — POCT INR: INR: 2

## 2013-07-28 NOTE — Progress Notes (Signed)
Pre-visit discussion using our clinic review tool. No additional management support is needed unless otherwise documented below in the visit note.  

## 2013-08-05 ENCOUNTER — Ambulatory Visit (INDEPENDENT_AMBULATORY_CARE_PROVIDER_SITE_OTHER): Payer: Medicare Other | Admitting: General Practice

## 2013-08-05 DIAGNOSIS — Z954 Presence of other heart-valve replacement: Secondary | ICD-10-CM

## 2013-08-05 DIAGNOSIS — I4891 Unspecified atrial fibrillation: Secondary | ICD-10-CM

## 2013-08-05 DIAGNOSIS — Z7901 Long term (current) use of anticoagulants: Secondary | ICD-10-CM

## 2013-08-05 DIAGNOSIS — I359 Nonrheumatic aortic valve disorder, unspecified: Secondary | ICD-10-CM

## 2013-08-05 LAB — POCT INR: INR: 3

## 2013-08-05 NOTE — Progress Notes (Signed)
Pre-visit discussion using our clinic review tool. No additional management support is needed unless otherwise documented below in the visit note.  

## 2013-09-02 ENCOUNTER — Ambulatory Visit (INDEPENDENT_AMBULATORY_CARE_PROVIDER_SITE_OTHER): Payer: Medicare Other | Admitting: General Practice

## 2013-09-02 DIAGNOSIS — Z7901 Long term (current) use of anticoagulants: Secondary | ICD-10-CM

## 2013-09-02 DIAGNOSIS — I359 Nonrheumatic aortic valve disorder, unspecified: Secondary | ICD-10-CM

## 2013-09-02 DIAGNOSIS — Z954 Presence of other heart-valve replacement: Secondary | ICD-10-CM

## 2013-09-02 DIAGNOSIS — I4891 Unspecified atrial fibrillation: Secondary | ICD-10-CM

## 2013-09-02 LAB — POCT INR: INR: 2.2

## 2013-09-02 NOTE — Progress Notes (Signed)
Pre-visit discussion using our clinic review tool. No additional management support is needed unless otherwise documented below in the visit note.  

## 2013-10-08 ENCOUNTER — Encounter (HOSPITAL_BASED_OUTPATIENT_CLINIC_OR_DEPARTMENT_OTHER): Payer: Self-pay | Admitting: Emergency Medicine

## 2013-10-08 ENCOUNTER — Emergency Department (HOSPITAL_BASED_OUTPATIENT_CLINIC_OR_DEPARTMENT_OTHER)
Admission: EM | Admit: 2013-10-08 | Discharge: 2013-10-08 | Disposition: A | Discharge status: Home / Self Care | Payer: Medicare Other | Attending: Emergency Medicine | Admitting: Emergency Medicine

## 2013-10-08 DIAGNOSIS — Z79899 Other long term (current) drug therapy: Secondary | ICD-10-CM | POA: Insufficient documentation

## 2013-10-08 DIAGNOSIS — Y9389 Activity, other specified: Secondary | ICD-10-CM | POA: Insufficient documentation

## 2013-10-08 DIAGNOSIS — Y929 Unspecified place or not applicable: Secondary | ICD-10-CM | POA: Insufficient documentation

## 2013-10-08 DIAGNOSIS — W503XXA Accidental bite by another person, initial encounter: Secondary | ICD-10-CM | POA: Insufficient documentation

## 2013-10-08 DIAGNOSIS — M1990 Unspecified osteoarthritis, unspecified site: Secondary | ICD-10-CM | POA: Insufficient documentation

## 2013-10-08 DIAGNOSIS — Z7901 Long term (current) use of anticoagulants: Secondary | ICD-10-CM | POA: Insufficient documentation

## 2013-10-08 DIAGNOSIS — I10 Essential (primary) hypertension: Secondary | ICD-10-CM | POA: Insufficient documentation

## 2013-10-08 DIAGNOSIS — Z87891 Personal history of nicotine dependence: Secondary | ICD-10-CM | POA: Insufficient documentation

## 2013-10-08 DIAGNOSIS — R791 Abnormal coagulation profile: Secondary | ICD-10-CM | POA: Insufficient documentation

## 2013-10-08 DIAGNOSIS — I4891 Unspecified atrial fibrillation: Secondary | ICD-10-CM | POA: Insufficient documentation

## 2013-10-08 DIAGNOSIS — S01502A Unspecified open wound of oral cavity, initial encounter: Secondary | ICD-10-CM | POA: Insufficient documentation

## 2013-10-08 DIAGNOSIS — M109 Gout, unspecified: Secondary | ICD-10-CM | POA: Insufficient documentation

## 2013-10-08 DIAGNOSIS — K1379 Other lesions of oral mucosa: Secondary | ICD-10-CM

## 2013-10-08 DIAGNOSIS — Z87448 Personal history of other diseases of urinary system: Secondary | ICD-10-CM | POA: Insufficient documentation

## 2013-10-08 DIAGNOSIS — Z8719 Personal history of other diseases of the digestive system: Secondary | ICD-10-CM | POA: Insufficient documentation

## 2013-10-08 LAB — CBC
HCT: 37.8 % — ABNORMAL LOW (ref 39.0–52.0)
Hemoglobin: 13 g/dL (ref 13.0–17.0)
MCH: 32.8 pg (ref 26.0–34.0)
MCHC: 34.4 g/dL (ref 30.0–36.0)
MCV: 95.5 fL (ref 78.0–100.0)
Platelets: 210 10*3/uL (ref 150–400)
RBC: 3.96 MIL/uL — ABNORMAL LOW (ref 4.22–5.81)
RDW: 12.6 % (ref 11.5–15.5)
WBC: 9.2 10*3/uL (ref 4.0–10.5)

## 2013-10-08 LAB — PROTIME-INR
INR: 3.54 — ABNORMAL HIGH (ref 0.00–1.49)
Prothrombin Time: 34.1 seconds — ABNORMAL HIGH (ref 11.6–15.2)

## 2013-10-08 MED ORDER — SILVER NITRATE-POT NITRATE 75-25 % EX MISC
CUTANEOUS | Status: AC
  Filled 2013-10-08: qty 1

## 2013-10-08 MED ORDER — GELATIN ABSORBABLE 12-7 MM EX MISC
1.0000 | Freq: Once | CUTANEOUS | Status: AC
  Administered 2013-10-08: 1 via TOPICAL
  Filled 2013-10-08: qty 1

## 2013-10-08 NOTE — Discharge Instructions (Signed)
Return to the ED with any concerns including bleeding that does not stop with direct pressure, blood in stool or urine, fainting, decreased level of alertness/lethargy, or any other alarming symptoms  You should skip 2 doses of your coumadin for the next 2 days and be sure to arrange for a recheck with your doctor and have your INR rechecked

## 2013-10-08 NOTE — ED Provider Notes (Signed)
CSN: [id number]     Arrival date & time 10/08/13  2014 History  This chart was scribed for Threasa Beards, MD by Sydell Axon, ED Scribe. This patient was seen in room MH08/MH08 and the patient's care was started at 8:45 PM.  Chief Complaint  Patient presents with  . Bit tongue    Patient is a 78 y.o. male presenting with general illness. The history is provided by the patient. No language interpreter was used.  Illness Location:  Tongue Quality:  Bleeding Severity:  Mild Onset quality:  Sudden Duration:  9 hours Timing:  Constant Progression:  Unchanged Chronicity:  New Context:  Eating lunch Relieved by:  None tried Associated symptoms: no chest pain, no cough, no diarrhea, no fever, no headaches, no nausea, no shortness of breath, no sore throat and no vomiting   Risk factors:  Coumadin medication  HPI Comments: [name] is a 78 y.o. Male, with a history of HTN, who presents to the Emergency Department complaining of continued, unchanged bleeding to his tongue that began at 12:30PM today. Patent reports that he accidentally bit his own tongue while eating lunch. He denies any pain on his tongue and states he was able to return to his food and finish his lunch. Patient is currently taking Coumadin for Atrial Fibrillation. Patient is a former smoker.  Past Medical History  Diagnosis Date  . HTN (hypertension)   . Atrial fibrillation   . Aortic stenosis   . Erectile dysfunction   . Vertigo   . Gout   . GERD (gastroesophageal reflux disease)   . DJD (degenerative joint disease)   . MVP (mitral valve prolapse)   . Hernia   . Baker cyst    Past Surgical History  Procedure Laterality Date  . Hernia repair    . Cartilage surgery    . Hernia repair    . Aortic valve replacement    . Right total hip    .  polyps vocal cord     Family History  Problem Relation Age of Onset  . Diabetes    . Stroke     History  Substance Use Topics  . Smoking status: Former Research scientist (life sciences)   . Smokeless tobacco: Not on file     Comment: quit appox 6 years ago  . Alcohol Use: Yes    Review of Systems  Constitutional: Negative for fever and chills.  HENT: Negative for sore throat.   Respiratory: Negative for cough and shortness of breath.   Cardiovascular: Negative for chest pain.  Gastrointestinal: Negative for nausea, vomiting and diarrhea.  Skin: Positive for wound (Tongue).  Neurological: Negative for numbness and headaches.  All other systems reviewed and are negative.   Allergies  Review of patient's allergies indicates no known allergies.  Home Medications   Current Outpatient Rx  Name  Route  Sig  Dispense  Refill  . allopurinol (ZYLOPRIM) 300 MG tablet      TAKE ONE TABLET BY MOUTH EVERY DAY   100 tablet   3   . amLODipine (NORVASC) 5 MG tablet      One tab daily   90 tablet   3   . predniSONE (DELTASONE) 20 MG tablet      One half tab every other day   50 tablet   2   . traZODone (DESYREL) 50 MG tablet   Oral   Take 1 tablet (50 mg total) by mouth at bedtime.   90 tablet  1   . warfarin (COUMADIN) 5 MG tablet      Take as directed by [facility] Clinic   100 tablet   1     90 day supply    Triage Vitals: BP 165/94  Pulse 96  Temp(Src) 98.1 F (36.7 C) (Oral)  Resp 18  Ht 5\' 9"  (1.753 m)  Wt 175 lb (79.379 kg)  BMI 25.83 kg/m2  SpO2 98% Vitals reviewed Physical Exam Physical Examination: General appearance - alert, well appearing, and in no distress Mental status - alert, oriented to person, place, and time Eyes - no conjunctival injection, no scleral icterus Mouth - mucous membranes moist, pharynx normal without lesions, tongue with small laceration with oozing of blood Neck - supple, no significant adenopathy Chest - clear to auscultation, no wheezes, rales or rhonchi, symmetric air entry Heart - normal rate, regular rhythm, normal S1, S2, no murmurs, rubs, clicks or gallops Extremities - peripheral pulses normal, no  pedal edema, no clubbing or cyanosis Skin - normal coloration and turgor, no rashes  ED Course  Procedures (including critical care time)  DIAGNOSTIC STUDIES: Oxygen Saturation is 98% on room air, normal by my interpretation.    COORDINATION OF CARE: 8:48 PM-Directed patient to apply pressure to the area with gauze for 10 minutes. Ordered CBC to verify appropriate Coumadin levels. Treatment plan discussed with patient and patient agrees.  Labs Review Labs Reviewed  CBC - Abnormal; Notable for the following:    RBC 3.96 (*)    HCT 37.8 (*)    All other components within normal limits  PROTIME-INR - Abnormal; Notable for the following:    Prothrombin Time 34.1 (*)    INR 3.54 (*)    All other components within normal limits   Imaging Review No results found.  MDM   Final diagnoses:  Oral bleeding  Supratherapeutic INR    Pt presenting with continued bleeding from small tongue laceration, pt's INR is elevated at 3.54, attempts at holding pressure, surgicel, cautery with silver nitrate unsuccessful.  Injected with 1cc of lidocaine with epi with no further bleeding. Pt advised to hold 2 doses of coumadin and have INR rechecked by his doctor.  Discharged with strict return precautions.  Pt agreeable with plan.   I personally performed the services described in this documentation, which was scribed in my presence. The recorded information has been reviewed and is accurate.     Threasa Beards, MD 10/08/13 [phone]

## 2013-10-08 NOTE — ED Notes (Signed)
Bit tongue today-cont'd bleeding

## 2013-10-13 ENCOUNTER — Ambulatory Visit (INDEPENDENT_AMBULATORY_CARE_PROVIDER_SITE_OTHER): Payer: Medicare Other | Admitting: General Practice

## 2013-10-13 DIAGNOSIS — Z7901 Long term (current) use of anticoagulants: Secondary | ICD-10-CM

## 2013-10-13 DIAGNOSIS — Z954 Presence of other heart-valve replacement: Secondary | ICD-10-CM

## 2013-10-13 DIAGNOSIS — I4891 Unspecified atrial fibrillation: Secondary | ICD-10-CM

## 2013-10-13 DIAGNOSIS — Z5181 Encounter for therapeutic drug level monitoring: Secondary | ICD-10-CM | POA: Insufficient documentation

## 2013-10-13 DIAGNOSIS — I359 Nonrheumatic aortic valve disorder, unspecified: Secondary | ICD-10-CM

## 2013-10-13 LAB — POCT INR: INR: 1.4

## 2013-10-13 NOTE — Progress Notes (Signed)
Pre-visit discussion using our clinic review tool. No additional management support is needed unless otherwise documented below in the visit note.  

## 2013-10-29 ENCOUNTER — Ambulatory Visit (INDEPENDENT_AMBULATORY_CARE_PROVIDER_SITE_OTHER): Payer: Medicare Other | Admitting: Family Medicine

## 2013-10-29 DIAGNOSIS — I4891 Unspecified atrial fibrillation: Secondary | ICD-10-CM

## 2013-10-29 DIAGNOSIS — Z5181 Encounter for therapeutic drug level monitoring: Secondary | ICD-10-CM

## 2013-10-29 DIAGNOSIS — I359 Nonrheumatic aortic valve disorder, unspecified: Secondary | ICD-10-CM

## 2013-10-29 DIAGNOSIS — Z7901 Long term (current) use of anticoagulants: Secondary | ICD-10-CM

## 2013-10-29 DIAGNOSIS — Z954 Presence of other heart-valve replacement: Secondary | ICD-10-CM

## 2013-10-29 LAB — POCT INR: INR: 3.3

## 2013-11-26 ENCOUNTER — Ambulatory Visit (INDEPENDENT_AMBULATORY_CARE_PROVIDER_SITE_OTHER): Payer: Medicare Other | Admitting: General Practice

## 2013-11-26 DIAGNOSIS — Z954 Presence of other heart-valve replacement: Secondary | ICD-10-CM

## 2013-11-26 DIAGNOSIS — Z7901 Long term (current) use of anticoagulants: Secondary | ICD-10-CM

## 2013-11-26 DIAGNOSIS — Z5181 Encounter for therapeutic drug level monitoring: Secondary | ICD-10-CM

## 2013-11-26 DIAGNOSIS — I359 Nonrheumatic aortic valve disorder, unspecified: Secondary | ICD-10-CM

## 2013-11-26 DIAGNOSIS — I4891 Unspecified atrial fibrillation: Secondary | ICD-10-CM

## 2013-11-26 LAB — POCT INR: INR: 2.7

## 2013-11-26 NOTE — Progress Notes (Signed)
Pre visit review using our clinic review tool, if applicable. No additional management support is needed unless otherwise documented below in the visit note. 

## 2013-12-24 ENCOUNTER — Ambulatory Visit (INDEPENDENT_AMBULATORY_CARE_PROVIDER_SITE_OTHER): Payer: Medicare Other | Admitting: General Practice

## 2013-12-24 DIAGNOSIS — I359 Nonrheumatic aortic valve disorder, unspecified: Secondary | ICD-10-CM

## 2013-12-24 DIAGNOSIS — Z7901 Long term (current) use of anticoagulants: Secondary | ICD-10-CM

## 2013-12-24 DIAGNOSIS — I4891 Unspecified atrial fibrillation: Secondary | ICD-10-CM

## 2013-12-24 DIAGNOSIS — Z5181 Encounter for therapeutic drug level monitoring: Secondary | ICD-10-CM

## 2013-12-24 DIAGNOSIS — Z954 Presence of other heart-valve replacement: Secondary | ICD-10-CM

## 2013-12-24 LAB — POCT INR: INR: 2.6

## 2013-12-24 NOTE — Progress Notes (Signed)
Pre visit review using our clinic review tool, if applicable. No additional management support is needed unless otherwise documented below in the visit note. 

## 2013-12-25 ENCOUNTER — Ambulatory Visit (INDEPENDENT_AMBULATORY_CARE_PROVIDER_SITE_OTHER): Payer: Medicare Other | Admitting: Cardiology

## 2013-12-25 ENCOUNTER — Encounter: Payer: Self-pay | Admitting: Cardiology

## 2013-12-25 VITALS — BP 145/77 | HR 71 | Ht 69.0 in | Wt 180.6 lb

## 2013-12-25 DIAGNOSIS — N529 Male erectile dysfunction, unspecified: Secondary | ICD-10-CM

## 2013-12-25 DIAGNOSIS — I4891 Unspecified atrial fibrillation: Secondary | ICD-10-CM

## 2013-12-25 DIAGNOSIS — I359 Nonrheumatic aortic valve disorder, unspecified: Secondary | ICD-10-CM

## 2013-12-25 MED ORDER — SILDENAFIL CITRATE 50 MG PO TABS: 50.0000 mg | ORAL_TABLET | Freq: Every day | ORAL | Status: DC | PRN

## 2013-12-25 NOTE — Assessment & Plan Note (Signed)
Status post aortic valve replacement. Continue SBE prophylaxis. He will need followup and echo CTA in the future.

## 2013-12-25 NOTE — Assessment & Plan Note (Signed)
Patient given a prescription for viagra as needed.

## 2013-12-25 NOTE — Progress Notes (Signed)
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 Preoperative cardiac catheterization revealed normal coronary arteries. He has permanent atrial fibrillation. Previous carotid Dopplers due to bruits revealed 0-39% bilateral stenosis. A monitor in August of 2011 showed occasional 3.1 second pause. Tenoretic was discontinued and Norvasc added for his blood pressure. Echo in April 2014 showed normal LV function, bioprosthetic aortic valve with mean gradient of 15 mm mercury. The right atrium is moderately dilated. CTA in April of 2014 showed atheromatous thoracic aorta without aneurysm, dissection or stenosis. Since I last saw him in Oct of 2014, the patient has dyspnea with more extreme activities but not with routine activities. It is relieved with rest. It is not associated with chest pain. There is no orthopnea, PND or pedal edema. There is no syncope or palpitations. There is no exertional chest pain.   Current Outpatient Prescriptions  Medication Sig Dispense Refill  . allopurinol (ZYLOPRIM) 300 MG tablet TAKE ONE TABLET BY MOUTH EVERY DAY  100 tablet  3  . amLODipine (NORVASC) 5 MG tablet One tab daily  90 tablet  3  . predniSONE (DELTASONE) 20 MG tablet One half tab every other day  50 tablet  2  . traZODone (DESYREL) 50 MG tablet Take 1 tablet (50 mg total) by mouth at bedtime.  90 tablet  1  . warfarin (COUMADIN) 5 MG tablet Take as directed by [facility] Clinic  100 tablet  1   No current facility-administered medications for this visit.     Past Medical History  Diagnosis Date  . HTN (hypertension)   . Atrial fibrillation   . Aortic stenosis   . Erectile dysfunction   . Vertigo   . Gout   . GERD (gastroesophageal reflux disease)   . DJD (degenerative joint disease)   . MVP (mitral valve prolapse)   . Hernia   . Baker cyst     Past Surgical History  Procedure Laterality Date    . Hernia repair    . Cartilage surgery    . Hernia repair    . Aortic valve replacement    . Right total hip    .  polyps vocal cord      History   Social History  . Marital Status: Divorced    Spouse Name: N/A    Number of Children: N/A  . Years of Education: N/A   Occupational History  . Not on file.   Social History Main Topics  . Smoking status: Former Research scientist (life sciences)  . Smokeless tobacco: Not on file     Comment: quit appox 6 years ago  . Alcohol Use: Yes  . Drug Use: No  . Sexual Activity: Not on file   Other Topics Concern  . Not on file   Social History Narrative  . No narrative on file    ROS: no fevers or chills, productive cough, hemoptysis, dysphasia, odynophagia, melena, hematochezia, dysuria, hematuria, rash, seizure activity, orthopnea, PND, pedal edema, claudication. Remaining systems are negative.  Physical Exam: Well-developed well-nourished in no acute distress.  Skin is warm and dry.  HEENT is normal.  Neck is supple.  Chest is clear to auscultation with normal expansion.  Cardiovascular exam is irregular, 2/6 systolic murmur left sternal border. No diastolic murmur. Abdominal exam nontender or distended. No masses palpated. Extremities show no edema. neuro grossly intact  ECG Atrial fibrillation at a rate of 71. RV  conduction delay.

## 2013-12-25 NOTE — Assessment & Plan Note (Signed)
Continue present blood pressure medications. 

## 2013-12-25 NOTE — Patient Instructions (Signed)
Your physician wants you to follow-up in: [place] will receive a reminder letter in the mail two months in advance. If you don't receive a letter, please call our office to schedule the follow-up appointment.   VIAGRA 50 MG ONCE DAILY AS NEEDED

## 2013-12-25 NOTE — Assessment & Plan Note (Signed)
Patient remains in atrial fibrillation. Rate is controlled on no medications. Continue Coumadin.

## 2014-01-13 ENCOUNTER — Other Ambulatory Visit: Payer: Self-pay | Admitting: Family Medicine

## 2014-01-21 ENCOUNTER — Ambulatory Visit (INDEPENDENT_AMBULATORY_CARE_PROVIDER_SITE_OTHER): Payer: Medicare Other | Admitting: Family Medicine

## 2014-01-21 DIAGNOSIS — Z5181 Encounter for therapeutic drug level monitoring: Secondary | ICD-10-CM

## 2014-01-21 DIAGNOSIS — I359 Nonrheumatic aortic valve disorder, unspecified: Secondary | ICD-10-CM

## 2014-01-21 DIAGNOSIS — Z7901 Long term (current) use of anticoagulants: Secondary | ICD-10-CM

## 2014-01-21 DIAGNOSIS — I4891 Unspecified atrial fibrillation: Secondary | ICD-10-CM

## 2014-01-21 DIAGNOSIS — Z954 Presence of other heart-valve replacement: Secondary | ICD-10-CM

## 2014-01-21 LAB — POCT INR: INR: 2.9

## 2014-02-24 ENCOUNTER — Other Ambulatory Visit: Payer: Self-pay | Admitting: Cardiology

## 2014-03-04 ENCOUNTER — Ambulatory Visit (INDEPENDENT_AMBULATORY_CARE_PROVIDER_SITE_OTHER): Payer: Medicare Other | Admitting: General Practice

## 2014-03-04 DIAGNOSIS — I359 Nonrheumatic aortic valve disorder, unspecified: Secondary | ICD-10-CM

## 2014-03-04 DIAGNOSIS — Z954 Presence of other heart-valve replacement: Secondary | ICD-10-CM

## 2014-03-04 DIAGNOSIS — I4891 Unspecified atrial fibrillation: Secondary | ICD-10-CM

## 2014-03-04 DIAGNOSIS — Z5181 Encounter for therapeutic drug level monitoring: Secondary | ICD-10-CM

## 2014-03-04 DIAGNOSIS — Z7901 Long term (current) use of anticoagulants: Secondary | ICD-10-CM

## 2014-03-04 LAB — POCT INR: INR: 1.9

## 2014-03-04 NOTE — Progress Notes (Signed)
Pre visit review using our clinic review tool, if applicable. No additional management support is needed unless otherwise documented below in the visit note. 

## 2014-03-06 NOTE — Telephone Encounter (Signed)
Closed encounter °

## 2014-03-30 ENCOUNTER — Ambulatory Visit (INDEPENDENT_AMBULATORY_CARE_PROVIDER_SITE_OTHER): Payer: Medicare Other | Admitting: Family Medicine

## 2014-03-30 VITALS — BP 144/72 | HR 86 | Temp 97.8°F | Resp 18 | Ht 66.0 in | Wt 167.0 lb

## 2014-03-30 DIAGNOSIS — Z202 Contact with and (suspected) exposure to infections with a predominantly sexual mode of transmission: Secondary | ICD-10-CM

## 2014-03-30 NOTE — Patient Instructions (Signed)
If the patient tests negative, he should consider using a condom consistently for future protection even though he would've previously been exposed already. If the patient tests positive, treatment would be optional for him. If he desires to take a course of Valtrex we can do so, but there is probably little to be gained unless he gets an acute lesion. However for peace of mind it would be very legitimate to give him a course. If he tests positive, there is no way of proving further he has had it in a carrier state since his previous marriage, or whether  his girlfriend has unknowingly been carrying it and only now has had a outbreak. There is no way of proving old testing is available.  Additional tests will also be done for HIV, syphilis, and URiprobe for gonorrhea and Chlamydia.  We will know the results of the tests.

## 2014-03-30 NOTE — Progress Notes (Signed)
Subjective: 78 year old gentleman who is here for STD evaluation because his girlfriend has had HSV about one month ago.  They have been sexually involved for about 6 months. He has not gotten anything else. He has never had STDs. He has had 2 previous lifetime sexual partners, the first was his wife of 30 years and the second his wife of 9 years. He knows of no STD exposures. She likes him evaluated because she believes she got it from him.  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 If the patient tests positive, treatment would be optional for him. If he desires to take a course of Valtrex we can do so, but there is probably little to be gained unless he gets an acute lesion. However for peace of mind it would be very legitimate to give him a course. If he tests positive, there is no way of proving further he has had it in a carrier state since his previous marriage, or whether  his girlfriend has unknowingly been carrying it and only now has had a outbreak. There is no way of proving old testing is available.  Additional tests will also be done for HIV, syphilis, and URiprobe for gonorrhea and Chlamydia.

## 2014-03-31 LAB — HSV(HERPES SIMPLEX VRS) I + II AB-IGG
HSV 1 Glycoprotein G Ab, IgG: 0.11 IV
HSV 2 Glycoprotein G Ab, IgG: 9.7 IV — ABNORMAL HIGH

## 2014-03-31 LAB — GC/CHLAMYDIA PROBE AMP
CT Probe RNA: NEGATIVE
GC Probe RNA: NEGATIVE

## 2014-03-31 LAB — RPR

## 2014-03-31 LAB — HIV ANTIBODY (ROUTINE TESTING W REFLEX): HIV 1&2 Ab, 4th Generation: NONREACTIVE

## 2014-04-13 ENCOUNTER — Other Ambulatory Visit: Payer: Self-pay | Admitting: Family Medicine

## 2014-04-14 ENCOUNTER — Ambulatory Visit (INDEPENDENT_AMBULATORY_CARE_PROVIDER_SITE_OTHER): Payer: Medicare Other | Admitting: *Deleted

## 2014-04-14 DIAGNOSIS — I359 Nonrheumatic aortic valve disorder, unspecified: Secondary | ICD-10-CM

## 2014-04-14 DIAGNOSIS — Z5181 Encounter for therapeutic drug level monitoring: Secondary | ICD-10-CM

## 2014-04-14 DIAGNOSIS — Z954 Presence of other heart-valve replacement: Secondary | ICD-10-CM

## 2014-04-14 DIAGNOSIS — Z7901 Long term (current) use of anticoagulants: Secondary | ICD-10-CM

## 2014-04-14 DIAGNOSIS — I4891 Unspecified atrial fibrillation: Secondary | ICD-10-CM

## 2014-04-14 LAB — POCT INR: INR: 2.9

## 2014-04-15 ENCOUNTER — Emergency Department (HOSPITAL_BASED_OUTPATIENT_CLINIC_OR_DEPARTMENT_OTHER)
Admission: EM | Admit: 2014-04-15 | Discharge: 2014-04-15 | Disposition: A | Discharge status: Home / Self Care | Payer: Medicare Other | Attending: Emergency Medicine | Admitting: Emergency Medicine

## 2014-04-15 ENCOUNTER — Encounter (HOSPITAL_BASED_OUTPATIENT_CLINIC_OR_DEPARTMENT_OTHER): Payer: Self-pay | Admitting: Emergency Medicine

## 2014-04-15 DIAGNOSIS — Z9889 Other specified postprocedural states: Secondary | ICD-10-CM | POA: Insufficient documentation

## 2014-04-15 DIAGNOSIS — I10 Essential (primary) hypertension: Secondary | ICD-10-CM | POA: Diagnosis not present

## 2014-04-15 DIAGNOSIS — N410 Acute prostatitis: Secondary | ICD-10-CM | POA: Insufficient documentation

## 2014-04-15 DIAGNOSIS — I4891 Unspecified atrial fibrillation: Secondary | ICD-10-CM | POA: Diagnosis not present

## 2014-04-15 DIAGNOSIS — R011 Cardiac murmur, unspecified: Secondary | ICD-10-CM | POA: Diagnosis not present

## 2014-04-15 DIAGNOSIS — Z7901 Long term (current) use of anticoagulants: Secondary | ICD-10-CM | POA: Insufficient documentation

## 2014-04-15 DIAGNOSIS — Z79899 Other long term (current) drug therapy: Secondary | ICD-10-CM | POA: Diagnosis not present

## 2014-04-15 DIAGNOSIS — Z8719 Personal history of other diseases of the digestive system: Secondary | ICD-10-CM | POA: Insufficient documentation

## 2014-04-15 DIAGNOSIS — Z87891 Personal history of nicotine dependence: Secondary | ICD-10-CM | POA: Insufficient documentation

## 2014-04-15 DIAGNOSIS — R319 Hematuria, unspecified: Secondary | ICD-10-CM | POA: Diagnosis present

## 2014-04-15 DIAGNOSIS — Z862 Personal history of diseases of the blood and blood-forming organs and certain disorders involving the immune mechanism: Secondary | ICD-10-CM | POA: Insufficient documentation

## 2014-04-15 DIAGNOSIS — Z8639 Personal history of other endocrine, nutritional and metabolic disease: Secondary | ICD-10-CM | POA: Insufficient documentation

## 2014-04-15 LAB — URINE MICROSCOPIC-ADD ON

## 2014-04-15 LAB — URINALYSIS, ROUTINE W REFLEX MICROSCOPIC
Bilirubin Urine: NEGATIVE
Glucose, UA: 250 mg/dL — AB
Ketones, ur: 15 mg/dL — AB
Nitrite: NEGATIVE
Protein, ur: 30 mg/dL — AB
Specific Gravity, Urine: 1.008 (ref 1.005–1.030)
Urobilinogen, UA: 0.2 mg/dL (ref 0.0–1.0)
pH: 6 (ref 5.0–8.0)

## 2014-04-15 LAB — BASIC METABOLIC PANEL
Anion gap: 11 (ref 5–15)
BUN: 17 mg/dL (ref 6–23)
CO2: 28 mEq/L (ref 19–32)
Calcium: 9.6 mg/dL (ref 8.4–10.5)
Chloride: 101 mEq/L (ref 96–112)
Creatinine, Ser: 1 mg/dL (ref 0.50–1.35)
GFR calc Af Amer: 82 mL/min — ABNORMAL LOW (ref >90)
GFR calc non Af Amer: 71 mL/min — ABNORMAL LOW (ref >90)
Glucose, Bld: 168 mg/dL — ABNORMAL HIGH (ref 70–99)
Potassium: 3.9 mEq/L (ref 3.7–5.3)
Sodium: 140 mEq/L (ref 137–147)

## 2014-04-15 LAB — PROTIME-INR
INR: 2.31 — ABNORMAL HIGH (ref 0.00–1.49)
Prothrombin Time: 25.4 seconds — ABNORMAL HIGH (ref 11.6–15.2)

## 2014-04-15 MED ORDER — LEVOFLOXACIN 500 MG PO TABS
500.0000 mg | ORAL_TABLET | Freq: Every day | ORAL | Status: DC
Start: 2014-04-15 — End: 2014-06-10

## 2014-04-15 MED ORDER — LEVOFLOXACIN 500 MG PO TABS
500.0000 mg | ORAL_TABLET | Freq: Every day | ORAL | Status: DC
Start: 2014-04-15 — End: 2014-04-15

## 2014-04-15 NOTE — ED Notes (Signed)
Reports hematuria this morning. Denies pain. Denies frequency.

## 2014-04-15 NOTE — Discharge Instructions (Signed)

## 2014-04-15 NOTE — ED Provider Notes (Signed)
CSN: [id number]     Arrival date & time 04/15/14  1351 History   First MD Initiated Contact with Patient 04/15/14 1408     Chief Complaint  Patient presents with  . Hematuria     (Consider location/radiation/quality/duration/timing/severity/associated sxs/prior Treatment) HPI Comments: Pt noticed blood in his urine this morning. Denies flank pain or abdominal pain. Denies history of similar symptoms. No urinary symptoms or fever. States that his pt was 2.9 two days ago which is therapeutic for him  The history is provided by the patient. No language interpreter was used.    Past Medical History  Diagnosis Date  . HTN (hypertension)   . Atrial fibrillation   . Aortic stenosis   . Erectile dysfunction   . Vertigo   . Gout   . GERD (gastroesophageal reflux disease)   . DJD (degenerative joint disease)   . MVP (mitral valve prolapse)   . Hernia   . Baker cyst    Past Surgical History  Procedure Laterality Date  . Hernia repair    . Cartilage surgery    . Hernia repair    . Aortic valve replacement    . Right total hip    .  polyps vocal cord     Family History  Problem Relation Age of Onset  . Diabetes    . Stroke     History  Substance Use Topics  . Smoking status: Former Research scientist (life sciences)  . Smokeless tobacco: Not on file     Comment: quit appox 6 years ago  . Alcohol Use: Yes    Review of Systems  Constitutional: Negative.   Respiratory: Negative.   Cardiovascular: Negative.       Allergies  Review of patient's allergies indicates no known allergies.  Home Medications   Prior to Admission medications   Medication Sig Start Date End Date Taking? Authorizing Provider  allopurinol (ZYLOPRIM) 300 MG tablet TAKE ONE TABLET BY MOUTH EVERY DAY 06/10/13   Dorena Cookey, MD  amLODipine (NORVASC) 5 MG tablet One tab daily 06/10/13   Dorena Cookey, MD  sildenafil (VIAGRA) 50 MG tablet Take 1 tablet (50 mg total) by mouth daily as needed for erectile dysfunction. 12/25/13    Lelon Perla, MD  traZODone (DESYREL) 50 MG tablet TAKE 1 TAB AT BEDTIME AS NEEDED (TO REPLACE AMBIEN ) 04/13/14   Dorena Cookey, MD  warfarin (COUMADIN) 5 MG tablet 1 tablet on all days except 1/2 tablet on Tuesday, Thursday, and Saturday or as directed by the coumadin clinic 02/24/14   Lelon Perla, MD   BP 137/58  Pulse 90  Temp(Src) 98.3 F (36.8 C) (Oral)  Resp 18  Ht 5\' 8"  (1.727 m)  Wt 168 lb (76.204 kg)  BMI 25.55 kg/m2  SpO2 98% Physical Exam  Nursing note and vitals reviewed. Constitutional: He is oriented to person, place, and time. He appears well-developed and well-nourished.  Cardiovascular: Normal rate and regular rhythm.   Murmur heard. Pulmonary/Chest: Effort normal and breath sounds normal.  Abdominal: Soft. Bowel sounds are normal. There is no tenderness.  Musculoskeletal: Normal range of motion.  Neurological: He is alert and oriented to person, place, and time.  Skin: Skin is warm and dry.  Psychiatric: He has a normal mood and affect.    ED Course  Procedures (including critical care time) Labs Review Labs Reviewed  URINALYSIS, ROUTINE W REFLEX MICROSCOPIC - Abnormal; Notable for the following:    Color, Urine RED (*)  APPearance CLOUDY (*)    Glucose, UA 250 (*)    Hgb urine dipstick LARGE (*)    Ketones, ur 15 (*)    Protein, ur 30 (*)    Leukocytes, UA LARGE (*)    All other components within normal limits  URINE MICROSCOPIC-ADD ON - Abnormal; Notable for the following:    Bacteria, UA FEW (*)    All other components within normal limits  BASIC METABOLIC PANEL - Abnormal; Notable for the following:    Glucose, Bld 168 (*)    GFR calc non Af Amer 71 (*)    GFR calc Af Amer 82 (*)    All other components within normal limits  PROTIME-INR - Abnormal; Notable for the following:    Prothrombin Time 25.4 (*)    INR 2.31 (*)    All other components within normal limits    Imaging Review No results found.   EKG Interpretation None       MDM   Final diagnoses:  Acute prostatitis    Urine sent for culture. Will treat for infection. Discussed return precautions with pt and spouse    Glendell Docker, NP 04/15/14 1540

## 2014-04-18 LAB — URINE CULTURE: Colony Count: >=100000

## 2014-04-19 NOTE — ED Provider Notes (Signed)
Medical screening examination/treatment/procedure(s) were performed by non-physician practitioner and as supervising physician I was immediately available for consultation/collaboration.   Dot Lanes, MD 04/19/14 [phone]

## 2014-04-20 ENCOUNTER — Telehealth (HOSPITAL_BASED_OUTPATIENT_CLINIC_OR_DEPARTMENT_OTHER): Payer: Self-pay

## 2014-04-20 NOTE — Telephone Encounter (Signed)
Post ED Visit - Positive Culture Follow-up  Culture report reviewed by antimicrobial stewardship pharmacist: []  Wes Dante, Pharm.D., BCPS []  Heide Guile, Pharm.D., BCPS []  Alycia Rossetti, Pharm.D., BCPS []  Arvin, Pharm.D., BCPS, AAHIVP [x]  Legrand Como, Pharm.D., BCPS, AAHIVP []  Hassie Bruce, Pharm.D. []  Milus Glazier, Pharm.D.  Positive Urine culture, >/=100,000 colonies -> Proteus mirabilis Treated with Levofloxacin, organism sensitive to the same and no further patient follow-up is required at this time.  Dortha Kern 04/20/2014, 4:55 AM

## 2014-05-08 ENCOUNTER — Telehealth: Payer: Self-pay

## 2014-05-08 NOTE — Telephone Encounter (Signed)
Patient wants to pick up a copy of his lab work from his visit on 03/30/14.

## 2014-05-08 NOTE — Telephone Encounter (Signed)
Pt notified that they are up front for him to p/u

## 2014-05-20 ENCOUNTER — Other Ambulatory Visit: Payer: Self-pay | Admitting: Family Medicine

## 2014-05-26 ENCOUNTER — Ambulatory Visit (INDEPENDENT_AMBULATORY_CARE_PROVIDER_SITE_OTHER): Payer: Medicare Other | Admitting: *Deleted

## 2014-05-26 DIAGNOSIS — I4891 Unspecified atrial fibrillation: Secondary | ICD-10-CM

## 2014-05-26 DIAGNOSIS — Z5181 Encounter for therapeutic drug level monitoring: Secondary | ICD-10-CM

## 2014-05-26 DIAGNOSIS — Z954 Presence of other heart-valve replacement: Secondary | ICD-10-CM

## 2014-05-26 DIAGNOSIS — Z7901 Long term (current) use of anticoagulants: Secondary | ICD-10-CM

## 2014-05-26 DIAGNOSIS — I359 Nonrheumatic aortic valve disorder, unspecified: Secondary | ICD-10-CM

## 2014-05-26 LAB — POCT INR: INR: 2.8

## 2014-06-03 ENCOUNTER — Other Ambulatory Visit (INDEPENDENT_AMBULATORY_CARE_PROVIDER_SITE_OTHER): Payer: Medicare Other

## 2014-06-03 DIAGNOSIS — Z125 Encounter for screening for malignant neoplasm of prostate: Secondary | ICD-10-CM

## 2014-06-03 DIAGNOSIS — Z0000 Encounter for general adult medical examination without abnormal findings: Secondary | ICD-10-CM

## 2014-06-03 DIAGNOSIS — I10 Essential (primary) hypertension: Secondary | ICD-10-CM

## 2014-06-03 DIAGNOSIS — R300 Dysuria: Secondary | ICD-10-CM

## 2014-06-03 LAB — CBC WITH DIFFERENTIAL/PLATELET
Basophils Absolute: 0 10*3/uL (ref 0.0–0.1)
Basophils Relative: 0.7 % (ref 0.0–3.0)
Eosinophils Absolute: 0.1 10*3/uL (ref 0.0–0.7)
Eosinophils Relative: 1.7 % (ref 0.0–5.0)
HCT: 38.6 % — ABNORMAL LOW (ref 39.0–52.0)
Hemoglobin: 12.5 g/dL — ABNORMAL LOW (ref 13.0–17.0)
Lymphocytes Relative: 12.6 % (ref 12.0–46.0)
Lymphs Abs: 0.9 10*3/uL (ref 0.7–4.0)
MCHC: 32.5 g/dL (ref 30.0–36.0)
MCV: 98.3 fl (ref 78.0–100.0)
Monocytes Absolute: 0.4 10*3/uL (ref 0.1–1.0)
Monocytes Relative: 6.6 % (ref 3.0–12.0)
Neutro Abs: 5.3 10*3/uL (ref 1.4–7.7)
Neutrophils Relative %: 78.4 % — ABNORMAL HIGH (ref 43.0–77.0)
Platelets: 229 10*3/uL (ref 150.0–400.0)
RBC: 3.93 Mil/uL — ABNORMAL LOW (ref 4.22–5.81)
RDW: 14.4 % (ref 11.5–15.5)
WBC: 6.8 10*3/uL (ref 4.0–10.5)

## 2014-06-03 LAB — HEPATIC FUNCTION PANEL
ALT: 12 U/L (ref 0–53)
AST: 20 U/L (ref 0–37)
Albumin: 3.6 g/dL (ref 3.5–5.2)
Alkaline Phosphatase: 50 U/L (ref 39–117)
Bilirubin, Direct: 0.2 mg/dL (ref 0.0–0.3)
Total Bilirubin: 0.9 mg/dL (ref 0.2–1.2)
Total Protein: 6.6 g/dL (ref 6.0–8.3)

## 2014-06-03 LAB — BASIC METABOLIC PANEL
BUN: 16 mg/dL (ref 6–23)
CO2: 31 mEq/L (ref 19–32)
Calcium: 9.1 mg/dL (ref 8.4–10.5)
Chloride: 104 mEq/L (ref 96–112)
Creatinine, Ser: 1 mg/dL (ref 0.4–1.5)
GFR: 78.93 mL/min (ref >60.00)
Glucose, Bld: 90 mg/dL (ref 70–99)
Potassium: 3.6 mEq/L (ref 3.5–5.1)
Sodium: 139 mEq/L (ref 135–145)

## 2014-06-03 LAB — POCT URINALYSIS DIPSTICK
Bilirubin, UA: NEGATIVE
Glucose, UA: NEGATIVE
Ketones, UA: NEGATIVE
Nitrite, UA: POSITIVE
Spec Grav, UA: 1.02
Urobilinogen, UA: 0.2
pH, UA: 7

## 2014-06-03 LAB — PSA: PSA: 6.51 ng/mL — ABNORMAL HIGH (ref 0.10–4.00)

## 2014-06-03 LAB — LIPID PANEL
Cholesterol: 182 mg/dL (ref 0–200)
HDL: 51.9 mg/dL (ref >39.00)
LDL Cholesterol: 113 mg/dL — ABNORMAL HIGH (ref 0–99)
NonHDL: 130.1
Total CHOL/HDL Ratio: 4
Triglycerides: 87 mg/dL (ref 0.0–149.0)
VLDL: 17.4 mg/dL (ref 0.0–40.0)

## 2014-06-03 LAB — TSH: TSH: 0.61 u[IU]/mL (ref 0.35–4.50)

## 2014-06-03 NOTE — Addendum Note (Signed)
Addended by: Denna Haggard K on: 06/03/2014 11:09 AM   Modules accepted: Orders

## 2014-06-05 LAB — URINE CULTURE: Colony Count: >=100000

## 2014-06-10 ENCOUNTER — Ambulatory Visit (INDEPENDENT_AMBULATORY_CARE_PROVIDER_SITE_OTHER): Payer: Medicare Other | Admitting: Family Medicine

## 2014-06-10 ENCOUNTER — Encounter: Payer: Self-pay | Admitting: Family Medicine

## 2014-06-10 VITALS — BP 184/90 | Temp 98.1°F | Ht 66.75 in | Wt 172.0 lb

## 2014-06-10 DIAGNOSIS — N410 Acute prostatitis: Secondary | ICD-10-CM

## 2014-06-10 DIAGNOSIS — I10 Essential (primary) hypertension: Secondary | ICD-10-CM

## 2014-06-10 DIAGNOSIS — K2100 Gastro-esophageal reflux disease with esophagitis, without bleeding: Secondary | ICD-10-CM

## 2014-06-10 DIAGNOSIS — N528 Other male erectile dysfunction: Secondary | ICD-10-CM

## 2014-06-10 DIAGNOSIS — Z23 Encounter for immunization: Secondary | ICD-10-CM

## 2014-06-10 DIAGNOSIS — M1000 Idiopathic gout, unspecified site: Secondary | ICD-10-CM

## 2014-06-10 DIAGNOSIS — F419 Anxiety disorder, unspecified: Secondary | ICD-10-CM

## 2014-06-10 DIAGNOSIS — N529 Male erectile dysfunction, unspecified: Secondary | ICD-10-CM

## 2014-06-10 MED ORDER — TRAZODONE HCL 50 MG PO TABS: ORAL_TABLET | ORAL | Status: DC

## 2014-06-10 MED ORDER — ALLOPURINOL 300 MG PO TABS: ORAL_TABLET | ORAL | Status: DC

## 2014-06-10 MED ORDER — AMLODIPINE BESYLATE 5 MG PO TABS: ORAL_TABLET | ORAL | Status: DC

## 2014-06-10 MED ORDER — SILDENAFIL CITRATE 50 MG PO TABS: 100.0000 mg | ORAL_TABLET | Freq: Every day | ORAL | Status: DC | PRN

## 2014-06-10 MED ORDER — SULFAMETHOXAZOLE-TMP DS 800-160 MG PO TABS: 1.0000 | ORAL_TABLET | Freq: Two times a day (BID) | ORAL | Status: DC

## 2014-06-10 NOTE — Patient Instructions (Signed)
Septra DS,,,,,,,,,,, one twice daily for 1 month  Followup in 5 weeks  Continue your current medications  Check your blood pressure daily in the morning  Return in one week for followup with all your blood pressure readings and the device

## 2014-06-10 NOTE — Progress Notes (Signed)
Pre visit review using our clinic review tool, if applicable. No additional management support is needed unless otherwise documented below in the visit note. 

## 2014-06-10 NOTE — Progress Notes (Signed)
   Subjective:    Patient ID: [name], male    DOB: 1936/04/03, 78 y.o.   MRN: [id number]  HPI [name] is a 78 year old male divorced nonsmoker who comes in today for evaluation of sleep dysfunction, erectile dysfunction, gout, hypertension  Meds this review is accurate.  His lab work shows some abnormalities in the urine. Culture positive for staph species sensitive to Septra. We will start him on medication today. PSA went from 3-6 probably because of inflammation we'll get a followup PSA in 2 months.  He gets routine eye care, dental care, colonoscopy followup by his gastroenterologist.  Vaccinations updated by [name]  He also has a history of aortic valve disease. He's followed in cardiology and is on Coumadin.   Review of Systems  Constitutional: Negative.   HENT: Negative.   Eyes: Negative.   Respiratory: Negative.   Cardiovascular: Negative.   Gastrointestinal: Negative.   Endocrine: Negative.   Genitourinary: Negative.   Musculoskeletal: Negative.   Skin: Negative.   Allergic/Immunologic: Negative.   Neurological: Negative.   Hematological: Negative.   Psychiatric/Behavioral: Negative.        Objective:   Physical Exam  Nursing note and vitals reviewed. Constitutional: He is oriented to person, place, and time. He appears well-developed and well-nourished.  HENT:  Head: Normocephalic and atraumatic.  Right Ear: External ear normal.  Left Ear: External ear normal.  Nose: Nose normal.  Mouth/Throat: Oropharynx is clear and moist.  Eyes: Conjunctivae and EOM are normal. Pupils are equal, round, and reactive to light.  Neck: Normal range of motion. Neck supple. No JVD present. No tracheal deviation present. No thyromegaly present.  Cardiovascular: Normal rate, regular rhythm, normal heart sounds and intact distal pulses.  Exam reveals no gallop and no friction rub.   No murmur heard. Soft right carotid bruit........ left carotid sounds normal. No aortic  bruits peripheral pulses 2+ and symmetrical  Scar midline chest from previous valve replacement surgery. Click and slight murmur unchanged from previous exam  Pulmonary/Chest: Effort normal and breath sounds normal. No stridor. No respiratory distress. He has no wheezes. He has no rales. He exhibits no tenderness.  Abdominal: Soft. Bowel sounds are normal. He exhibits no distension and no mass. There is no tenderness. There is no rebound and no guarding.  Genitourinary: Penis normal.  Patient declines a rectal exam  Musculoskeletal: Normal range of motion. He exhibits no edema and no tenderness.  Lymphadenopathy:    He has no cervical adenopathy.  Neurological: He is alert and oriented to person, place, and time. He has normal reflexes. No cranial nerve deficit. He exhibits normal muscle tone.  Skin: Skin is warm and dry. No rash noted. No erythema. No pallor.  Psychiatric: He has a normal mood and affect. His behavior is normal. Judgment and thought content normal.          Assessment & Plan:  Healthy male  Prostatitis treated with Septra twice a day for 1 month followup PSA in 2 months  Erectile dysfunction.........[name] Kitchen Viagra 100 mg when necessary  History of gout.......... continue allopurinol  Hypertension,,,,,,,,,,,, recheck blood pressure right arm sitting position 190/90,,,,,,,,, states his blood pressure is 140/70 at home and he doesn't like to come in here and every time he does his blood pressure goes up........... BP check every morning followup here in one week with the data and the device  Status post aortic valve replacement........... again followed in cardiology.

## 2014-06-11 ENCOUNTER — Telehealth: Payer: Self-pay | Admitting: Family Medicine

## 2014-06-11 NOTE — Telephone Encounter (Signed)
emmi emailed °

## 2014-06-15 ENCOUNTER — Telehealth: Payer: Self-pay | Admitting: Family Medicine

## 2014-06-15 NOTE — Telephone Encounter (Signed)
Patient was calling to inform us that his blood pressure has been normal at home

## 2014-06-15 NOTE — Telephone Encounter (Signed)
Pt called and cancel his appointment for tomorrow and would like for [name] to call him

## 2014-06-16 ENCOUNTER — Encounter: Payer: Medicare Other | Admitting: Family Medicine

## 2014-07-08 ENCOUNTER — Ambulatory Visit (INDEPENDENT_AMBULATORY_CARE_PROVIDER_SITE_OTHER): Payer: Medicare Other

## 2014-07-08 DIAGNOSIS — Z5181 Encounter for therapeutic drug level monitoring: Secondary | ICD-10-CM

## 2014-07-08 DIAGNOSIS — I4891 Unspecified atrial fibrillation: Secondary | ICD-10-CM

## 2014-07-08 LAB — POCT INR: INR: 2.7

## 2014-07-14 ENCOUNTER — Ambulatory Visit: Payer: Medicare Other | Admitting: Family Medicine

## 2014-08-03 ENCOUNTER — Other Ambulatory Visit: Payer: Self-pay | Admitting: Family Medicine

## 2014-08-19 ENCOUNTER — Ambulatory Visit: Payer: Medicare Other | Admitting: Family Medicine

## 2014-08-19 DIAGNOSIS — I4891 Unspecified atrial fibrillation: Secondary | ICD-10-CM

## 2014-08-19 DIAGNOSIS — Z5181 Encounter for therapeutic drug level monitoring: Secondary | ICD-10-CM

## 2014-08-19 LAB — POCT INR: INR: 3

## 2014-09-30 ENCOUNTER — Ambulatory Visit (INDEPENDENT_AMBULATORY_CARE_PROVIDER_SITE_OTHER): Payer: Medicare Other | Admitting: General Practice

## 2014-09-30 ENCOUNTER — Telehealth: Payer: Self-pay | Admitting: Family

## 2014-09-30 DIAGNOSIS — Z5181 Encounter for therapeutic drug level monitoring: Secondary | ICD-10-CM

## 2014-09-30 LAB — POCT INR: INR: 2.7

## 2014-09-30 NOTE — Telephone Encounter (Signed)
Agree with plan 

## 2014-09-30 NOTE — Progress Notes (Signed)
Pre visit review using our clinic review tool, if applicable. No additional management support is needed unless otherwise documented below in the visit note. 

## 2014-10-16 DIAGNOSIS — M1712 Unilateral primary osteoarthritis, left knee: Secondary | ICD-10-CM | POA: Diagnosis not present

## 2014-10-16 DIAGNOSIS — M545 Low back pain: Secondary | ICD-10-CM | POA: Diagnosis not present

## 2014-10-26 ENCOUNTER — Other Ambulatory Visit: Payer: Self-pay | Admitting: Family Medicine

## 2014-10-27 ENCOUNTER — Other Ambulatory Visit: Payer: Self-pay | Admitting: Orthopedic Surgery

## 2014-10-27 ENCOUNTER — Other Ambulatory Visit: Payer: Self-pay | Admitting: General Practice

## 2014-10-27 ENCOUNTER — Other Ambulatory Visit: Payer: Self-pay

## 2014-10-27 DIAGNOSIS — M545 Low back pain, unspecified: Secondary | ICD-10-CM

## 2014-10-27 MED ORDER — WARFARIN SODIUM 5 MG PO TABS: ORAL_TABLET | ORAL | Status: DC

## 2014-10-29 ENCOUNTER — Telehealth: Payer: Self-pay | Admitting: *Deleted

## 2014-10-29 ENCOUNTER — Other Ambulatory Visit: Payer: Self-pay | Admitting: *Deleted

## 2014-10-29 MED ORDER — TRIAMCINOLONE 0.1 % CREAM:EUCERIN CREAM 1:1: 1.0000 "application " | TOPICAL_CREAM | Freq: Two times a day (BID) | CUTANEOUS | Status: DC

## 2014-10-29 NOTE — Telephone Encounter (Signed)
rx sent

## 2014-10-29 NOTE — Telephone Encounter (Signed)
Refill compound cream combination of eucerin cream and triamcinolon 0.025% cream apply bid #[phone] [employer]

## 2014-11-04 ENCOUNTER — Other Ambulatory Visit: Payer: Medicare Other

## 2014-11-04 ENCOUNTER — Inpatient Hospital Stay: Admission: RE | Admit: 2014-11-04 | Payer: Medicare Other | Source: Ambulatory Visit

## 2014-11-04 ENCOUNTER — Ambulatory Visit: Payer: Medicare Other

## 2014-11-10 ENCOUNTER — Ambulatory Visit (INDEPENDENT_AMBULATORY_CARE_PROVIDER_SITE_OTHER): Payer: Medicare Other | Admitting: General Practice

## 2014-11-10 ENCOUNTER — Telehealth: Payer: Self-pay | Admitting: General Practice

## 2014-11-10 DIAGNOSIS — Z5181 Encounter for therapeutic drug level monitoring: Secondary | ICD-10-CM | POA: Diagnosis not present

## 2014-11-10 LAB — POCT INR: INR: 3.2

## 2014-11-10 NOTE — Patient Instructions (Signed)
Instructions for coumadin pre and post procedure.  3/30 - Take last dose of coumadin until after procedure  4/4 - Procedure (Do not take coumadin)  4/5 - Take 1 1/2 tablets 4/6 - Take 1 1/2 tablets 4/7 - Continue current dosage  Re-check on 4/12

## 2014-11-10 NOTE — Telephone Encounter (Signed)
-----   Message from Lelon Perla, MD sent at 11/10/2014  1:51 PM EDT ----- Regarding: RE: Lovenox Bridge Yes Kirk Ruths  ----- Message -----    From: Warden Fillers, RN    Sent: 11/10/2014   1:04 PM      To: Lelon Perla, MD Subject: Lovenox Bridge                                 Patient will need to stop coumadin for 5 days for procedure on 4/4.  Do you want Lovenox bridge?  Thanks, [name], RN [facility]

## 2014-11-10 NOTE — Progress Notes (Signed)
Agree with plan 

## 2014-11-10 NOTE — Progress Notes (Signed)
Pre visit review using our clinic review tool, if applicable. No additional management support is needed unless otherwise documented below in the visit note. 

## 2014-11-11 ENCOUNTER — Other Ambulatory Visit: Payer: Self-pay | Admitting: General Practice

## 2014-11-12 ENCOUNTER — Telehealth: Payer: Self-pay | Admitting: General Practice

## 2014-11-12 NOTE — Telephone Encounter (Signed)
LMOVM

## 2014-11-12 NOTE — Telephone Encounter (Signed)
LMOVM .  Patient needs Lovenox bridging for procedure on 4/4 (per Dr. Stanford Breed)  Unable to contact patient. Left VM for pt to contact coumadin clinic at earliest convenience.

## 2014-11-24 ENCOUNTER — Telehealth: Payer: Self-pay | Admitting: General Practice

## 2014-11-24 ENCOUNTER — Other Ambulatory Visit: Payer: Self-pay | Admitting: General Practice

## 2014-11-24 MED ORDER — ENOXAPARIN SODIUM 120 MG/0.8ML ~~LOC~~ SOLN: 1.5000 mg/kg | SUBCUTANEOUS | Status: DC

## 2014-11-24 NOTE — Telephone Encounter (Signed)
Instructions for coumadin and Lovenox pre and post procedure. 3/30 - last dose of coumadin 3/31 - Nothing 4/1 - Lovenox x 1 in the AM 4/2 - Lovenox x 1 in the AM 4/3 - Lovenox x 1 in the AM 4/4 - Procedure (Nothing) 4/5 - Lovenox in the AM and 1 1/2 tablets of coumadin 4/6 - Lovenox in the AM and 1 1/2 tablets of coumadin 4/7 - Lovenox in the AM and 1 tablet of coumadin 4/8 - Re-check INR in clinic  Patient verbalizes understanding.

## 2014-11-24 NOTE — Telephone Encounter (Signed)
Patient mylogram on Monday and their not sure about what kind of shots needed. Please call patient

## 2014-11-25 NOTE — Telephone Encounter (Signed)
Please call.

## 2014-11-26 ENCOUNTER — Telehealth: Payer: Self-pay | Admitting: General Practice

## 2014-11-26 NOTE — Telephone Encounter (Signed)
Attempted to contact patient.  [name]

## 2014-11-30 ENCOUNTER — Ambulatory Visit (INDEPENDENT_AMBULATORY_CARE_PROVIDER_SITE_OTHER): Payer: Medicare Other | Admitting: General Practice

## 2014-11-30 ENCOUNTER — Ambulatory Visit
Admission: RE | Admit: 2014-11-30 | Discharge: 2014-11-30 | Disposition: A | Discharge status: Home / Self Care | Payer: Medicare Other | Source: Ambulatory Visit | Attending: Orthopedic Surgery | Admitting: Orthopedic Surgery

## 2014-11-30 DIAGNOSIS — M5137 Other intervertebral disc degeneration, lumbosacral region: Secondary | ICD-10-CM | POA: Diagnosis not present

## 2014-11-30 DIAGNOSIS — M47817 Spondylosis without myelopathy or radiculopathy, lumbosacral region: Secondary | ICD-10-CM | POA: Diagnosis not present

## 2014-11-30 DIAGNOSIS — M5127 Other intervertebral disc displacement, lumbosacral region: Secondary | ICD-10-CM | POA: Diagnosis not present

## 2014-11-30 DIAGNOSIS — M545 Low back pain, unspecified: Secondary | ICD-10-CM

## 2014-11-30 DIAGNOSIS — Z5181 Encounter for therapeutic drug level monitoring: Secondary | ICD-10-CM

## 2014-11-30 DIAGNOSIS — M4807 Spinal stenosis, lumbosacral region: Secondary | ICD-10-CM | POA: Diagnosis not present

## 2014-11-30 LAB — POCT INR: INR: 1.2

## 2014-11-30 MED ORDER — DIAZEPAM 5 MG PO TABS
5.0000 mg | ORAL_TABLET | Freq: Once | ORAL | Status: AC
  Administered 2014-11-30: 5 mg via ORAL

## 2014-11-30 MED ORDER — IOHEXOL 180 MG/ML  SOLN
13.0000 mL | Freq: Once | INTRAMUSCULAR | Status: AC | PRN
  Administered 2014-11-30: 15 mL via INTRATHECAL

## 2014-11-30 NOTE — Progress Notes (Signed)
Pre visit review using our clinic review tool, if applicable. No additional management support is needed unless otherwise documented below in the visit note. 

## 2014-11-30 NOTE — Progress Notes (Signed)
Pt has been off Tramadol, Trazodone and Coumadin as ordered.  Discharge instructions explained to pt.

## 2014-11-30 NOTE — Discharge Instructions (Signed)
Myelogram Discharge Instructions  1. Go home and rest quietly for the next 24 hours.  It is important to lie flat for the next 24 hours.  Get up only to go to the restroom.  You may lie in the bed or on a couch on your back, your stomach, your left side or your right side.  You may have one pillow under your head.  You may have pillows between your knees while you are on your side or under your knees while you are on your back.  2. DO NOT drive today.  Recline the seat as far back as it will go, while still wearing your seat belt, on the way home.  3. You may get up to go to the bathroom as needed.  You may sit up for 10 minutes to eat.  You may resume your normal diet and medications unless otherwise indicated.  Drink plenty of extra fluids today and tomorrow.  4. The incidence of a spinal headache with nausea and/or vomiting is about 5% (one in 20 patients).  If you develop a headache, lie flat and drink plenty of fluids until the headache goes away.  Caffeinated beverages may be helpful.  If you develop severe nausea and vomiting or a headache that does not go away with flat bed rest, call [phone].  5. You may resume normal activities after your 24 hours of bed rest is over; however, do not exert yourself strongly or do any heavy lifting tomorrow.  6. Call your physician for a follow-up appointment.   You may resume Coumadin today.  You may resume Trazodone on Tuesday, December 01, 2014 after 2:00p.m.

## 2014-12-03 ENCOUNTER — Ambulatory Visit (INDEPENDENT_AMBULATORY_CARE_PROVIDER_SITE_OTHER): Payer: Medicare Other | Admitting: General Practice

## 2014-12-03 DIAGNOSIS — Z5181 Encounter for therapeutic drug level monitoring: Secondary | ICD-10-CM

## 2014-12-03 LAB — POCT INR: INR: 1.4

## 2014-12-03 NOTE — Progress Notes (Signed)
Pre visit review using our clinic review tool, if applicable. No additional management support is needed unless otherwise documented below in the visit note. 

## 2014-12-07 DIAGNOSIS — M545 Low back pain: Secondary | ICD-10-CM | POA: Diagnosis not present

## 2014-12-16 ENCOUNTER — Ambulatory Visit (INDEPENDENT_AMBULATORY_CARE_PROVIDER_SITE_OTHER): Payer: Medicare Other | Admitting: General Practice

## 2014-12-16 DIAGNOSIS — Z5181 Encounter for therapeutic drug level monitoring: Secondary | ICD-10-CM

## 2014-12-16 LAB — POCT INR: INR: 2.7

## 2014-12-16 NOTE — Progress Notes (Signed)
Agree with plan 

## 2014-12-16 NOTE — Progress Notes (Signed)
Pre visit review using our clinic review tool, if applicable. No additional management support is needed unless otherwise documented below in the visit note. 

## 2014-12-25 NOTE — Progress Notes (Signed)
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 Preoperative cardiac catheterization revealed normal coronary arteries. He has permanent atrial fibrillation. Previous carotid Dopplers due to bruits revealed 0-39% bilateral stenosis. A monitor in August of 2011 showed occasional 3.1 second pause. Tenoretic was discontinued and Norvasc added for his blood pressure. Echo in April 2014 showed normal LV function, bioprosthetic aortic valve with mean gradient of 15 mm mercury. The right atrium is moderately dilated. CTA in April of 2014 showed atheromatous thoracic aorta without aneurysm, dissection or stenosis. Since I last saw him he notes some dyspnea on exertion. This occurs after walking 2 blocks. No orthopnea, PND, pedal edema, chest pain or syncope.  Current Outpatient Prescriptions  Medication Sig Dispense Refill  . allopurinol (ZYLOPRIM) 300 MG tablet TAKE ONE TABLET BY MOUTH EVERY DAY 100 tablet 3  . amLODipine (NORVASC) 5 MG tablet One tab daily 90 tablet 3  . enoxaparin (LOVENOX) 120 MG/0.8ML injection Inject 0.78 mLs (115 mg total) into the skin daily. 6 Syringe 0  . predniSONE (DELTASONE) 20 MG tablet TAKE ONE-HALF TABLET BY MOUTH EVERY OTHER DAY 50 tablet 0  . sulfamethoxazole-trimethoprim (BACTRIM DS) 800-160 MG per tablet Take 1 tablet by mouth 2 (two) times daily. 60 tablet 1  . traZODone (DESYREL) 50 MG tablet TAKE 1 TAB AT BEDTIME AS NEEDED (TO REPLACE AMBIEN ) 90 tablet 3  . Triamcinolone Acetonide (TRIAMCINOLONE 0.1 % CREAM : EUCERIN) CREA Apply 1 application topically 2 (two) times daily. 1 each 3  . warfarin (COUMADIN) 5 MG tablet Take as directed by the coumadin clinic 90 tablet 1   No current facility-administered medications for this visit.     Past Medical History  Diagnosis Date  . HTN (hypertension)   . Atrial fibrillation   . Aortic stenosis   . Erectile dysfunction    . Vertigo   . Gout   . GERD (gastroesophageal reflux disease)   . DJD (degenerative joint disease)   . MVP (mitral valve prolapse)   . Hernia   . Baker cyst     Past Surgical History  Procedure Laterality Date  . Hernia repair    . Cartilage surgery    . Hernia repair    . Aortic valve replacement    . Right total hip    .  polyps vocal cord      History   Social History  . Marital Status: Divorced    Spouse Name: N/A  . Number of Children: N/A  . Years of Education: N/A   Occupational History  . Not on file.   Social History Main Topics  . Smoking status: Former Research scientist (life sciences)  . Smokeless tobacco: Not on file     Comment: quit appox 6 years ago  . Alcohol Use: Yes  . Drug Use: No  . Sexual Activity: Not on file   Other Topics Concern  . Not on file   Social History Narrative    ROS: patient with back pain but no fevers or chills, productive cough, hemoptysis, dysphasia, odynophagia, melena, hematochezia, dysuria, hematuria, rash, seizure activity, orthopnea, PND, pedal edema, claudication. Remaining systems are negative.  Physical Exam: Well-developed well-nourished in no acute distress.  Skin is warm and dry.  HEENT is normal.  Neck is supple.  Chest is clear to auscultation with normal expansion.  Cardiovascular exam is irregular, 2/6 systolic murmur left sternal border. No diastolic murmur.  Abdominal exam nontender or distended. No masses palpated. Extremities show no edema. neuro grossly intact  ECG atrial fibrillation with PVCs or aberrantly conducted beats. RV conduction delay. No ST changes.

## 2014-12-29 ENCOUNTER — Encounter: Payer: Self-pay | Admitting: Cardiology

## 2014-12-29 ENCOUNTER — Ambulatory Visit (HOSPITAL_COMMUNITY)
Admission: RE | Admit: 2014-12-29 | Discharge: 2014-12-29 | Disposition: A | Discharge status: Home / Self Care | Payer: Medicare Other | Source: Ambulatory Visit | Attending: Cardiology | Admitting: Cardiology

## 2014-12-29 ENCOUNTER — Encounter: Payer: Self-pay | Admitting: *Deleted

## 2014-12-29 ENCOUNTER — Ambulatory Visit (INDEPENDENT_AMBULATORY_CARE_PROVIDER_SITE_OTHER): Payer: Medicare Other | Admitting: Cardiology

## 2014-12-29 VITALS — BP 150/60 | HR 87 | Ht 69.0 in | Wt 174.9 lb

## 2014-12-29 DIAGNOSIS — I482 Chronic atrial fibrillation, unspecified: Secondary | ICD-10-CM

## 2014-12-29 DIAGNOSIS — I10 Essential (primary) hypertension: Secondary | ICD-10-CM | POA: Diagnosis not present

## 2014-12-29 DIAGNOSIS — Z01810 Encounter for preprocedural cardiovascular examination: Secondary | ICD-10-CM

## 2014-12-29 DIAGNOSIS — I359 Nonrheumatic aortic valve disorder, unspecified: Secondary | ICD-10-CM | POA: Diagnosis not present

## 2014-12-29 NOTE — Assessment & Plan Note (Signed)
Status post aortic valve replacement. Continue SBE prophylaxis. He has mild dyspnea with moderate activities. Repeat echocardiogram to assess aortic valve and LV function.

## 2014-12-29 NOTE — Assessment & Plan Note (Signed)
Blood pressure mildly elevated. However typically controlled at home. Continue present dose of amlodipine and increase in the future if needed.

## 2014-12-29 NOTE — Assessment & Plan Note (Signed)
Patient remains in permanent atrial fibrillation. Rate is controlled on the medications. Continue Coumadin.

## 2014-12-29 NOTE — Assessment & Plan Note (Signed)
Patient has mild dyspnea with more moderate activities. Repeat echocardiogram to assess LV function and aortic valve. If unchanged compared to previous he may proceed with surgery. He is not having chest pain. He will need a Lovenox bridge while off Coumadin.

## 2014-12-29 NOTE — Patient Instructions (Signed)

## 2014-12-31 ENCOUNTER — Other Ambulatory Visit: Payer: Self-pay | Admitting: Surgical

## 2015-01-04 ENCOUNTER — Other Ambulatory Visit: Payer: Self-pay | Admitting: General Practice

## 2015-01-04 ENCOUNTER — Telehealth: Payer: Self-pay | Admitting: General Practice

## 2015-01-04 MED ORDER — ENOXAPARIN SODIUM 120 MG/0.8ML ~~LOC~~ SOLN: 120.0000 mg | SUBCUTANEOUS | Status: DC

## 2015-01-04 NOTE — Telephone Encounter (Signed)
Instructions for coumadin and Lovenox pre surgery on 5/20  5/15 - Take last dose of coumadin 5/16 - Nothing 5/17 - Lovenox in the AM 5/18 - Lovenox in the AM 5/19 - Lovenox in the AM 5/20 - Procedure (DO NOT TAKE LOVENOX)  Patient verbalizes understanding.

## 2015-01-07 ENCOUNTER — Other Ambulatory Visit (HOSPITAL_COMMUNITY): Payer: Self-pay | Admitting: Anesthesiology

## 2015-01-07 NOTE — Progress Notes (Signed)
eccho 5/16 epic lov with ekg Dr Stanford Breed 4/16 epic Clearance note dr Stanford Breed on chart Lovonox Bridging instructions epic under phone note dated 01/04/15

## 2015-01-07 NOTE — Patient Instructions (Signed)
Your procedure is scheduled on:  01/15/15  FRIDAY  Report to [facility] at  1030     AM.   Call this number if you have problems the morning of surgery: [phone]     REMEMBER-- ONLY ONE VISITOR IS ALLOWED IN SHORT STAY WHILE YOU ARE PREPARING FOR SURGERY   Do not eat food  Or drink :After Midnight. Thursday NIGHT   Take these medicines the morning of surgery with A SIP OF WATER:Amlodipine, Prednisone May take Tramadol if needed   .  Contacts, dentures or partial plates, or metal hairpins  can not be worn to surgery. Your family will be responsible for glasses, dentures, hearing aides while you are in surgery  Leave suitcase in the car. After surgery it may be brought to your room.  For patients admitted to the hospital, checkout time is 11:00 AM day of  discharge.         [facility] IS NOT RESPONSIBLE FOR ANY VALUABLES  Patients discharged the day of surgery will not be allowed to drive home. IF going home the day of surgery, you must have a driver and someone to stay with you for the first 24 hours                                                                                                                                      [facility] - Preparing for Surgery Before surgery, you can play an important role.  Because skin is not sterile, your skin needs to be as free of germs as possible.  You can reduce the number of germs on your skin by washing with CHG (chlorahexidine gluconate) soap before surgery.  CHG is an antiseptic cleaner which kills germs and bonds with the skin to continue killing germs even after washing. Please DO NOT use if you have an allergy to CHG or antibacterial soaps.  If your skin becomes reddened/irritated stop using the CHG and inform your nurse when you arrive at Short Stay. Do not shave (including legs and underarms) for at least 48 hours prior to the first CHG shower.  You  may shave your face/neck. Please follow these instructions carefully:  1.  Shower with CHG Soap the night before surgery and the  morning of Surgery.  2.  If you choose to wash your hair, wash your hair first as usual with your  normal  shampoo.  3.  After you shampoo, rinse your hair and body thoroughly to remove the  shampoo.                           4.  Use CHG as you would any other liquid soap.  You can apply chg directly  to the skin and wash  Gently with a scrungie or clean washcloth.  5.  Apply the CHG Soap to your body ONLY FROM THE NECK DOWN.   Do not use on face/ open                           Wound or open sores. Avoid contact with eyes, ears mouth and genitals (private parts).                       Wash face,  Genitals (private parts) with your normal soap.             6.  Wash thoroughly, paying special attention to the area where your surgery  will be performed.  7.  Thoroughly rinse your body with warm water from the neck down.  8.  DO NOT shower/wash with your normal soap after using and rinsing off  the CHG Soap.                9.  Pat yourself dry with a clean towel.            10.  Wear clean pajamas.            11.  Place clean sheets on your bed the night of your first shower and do not  sleep with pets. Day of Surgery : Do not apply any lotions/deodorants the morning of surgery.  Please wear clean clothes to the hospital/surgery center.  FAILURE TO FOLLOW THESE INSTRUCTIONS MAY RESULT IN THE CANCELLATION OF YOUR SURGERY PATIENT SIGNATURE_________________________________  NURSE SIGNATURE__________________________________  ________________________________________________________________________   [name]  An incentive spirometer is a tool that can help keep your lungs clear and active. This tool measures how well you are filling your lungs with each breath. Taking long deep breaths may help reverse or decrease the chance of  developing breathing (pulmonary) problems (especially infection) following:  A long period of time when you are unable to move or be active. BEFORE THE PROCEDURE   If the spirometer includes an indicator to show your best effort, your nurse or respiratory therapist will set it to a desired goal.  If possible, sit up straight or lean slightly forward. Try not to slouch.  Hold the incentive spirometer in an upright position. INSTRUCTIONS FOR USE  1. Sit on the edge of your bed if possible, or sit up as far as you can in bed or on a chair. 2. Hold the incentive spirometer in an upright position. 3. Breathe out normally. 4. Place the mouthpiece in your mouth and seal your lips tightly around it. 5. Breathe in slowly and as deeply as possible, raising the piston or the ball toward the top of the column. 6. Hold your breath for 3-5 seconds or for as long as possible. Allow the piston or ball to fall to the bottom of the column. 7. Remove the mouthpiece from your mouth and breathe out normally. 8. Rest for a few seconds and repeat Steps 1 through 7 at least 10 times every 1-2 hours when you are awake. Take your time and take a few normal breaths between deep breaths. 9. The spirometer may include an indicator to show your best effort. Use the indicator as a goal to work toward during each repetition. 10. After each set of 10 deep breaths, practice coughing to be sure your lungs are clear. If you have an incision (the cut made at the time of surgery),  support your incision when coughing by placing a pillow or rolled up towels firmly against it. Once you are able to get out of bed, walk around indoors and cough well. You may stop using the incentive spirometer when instructed by your caregiver.  RISKS AND COMPLICATIONS  Take your time so you do not get dizzy or light-headed.  If you are in pain, you may need to take or ask for pain medication before doing incentive spirometry. It is harder to take a  deep breath if you are having pain. AFTER USE  Rest and breathe slowly and easily.  It can be helpful to keep track of a log of your progress. Your caregiver can provide you with a simple table to help with this. If you are using the spirometer at home, follow these instructions: [place] IF:   You are having difficultly using the spirometer.  You have trouble using the spirometer as often as instructed.  Your pain medication is not giving enough relief while using the spirometer.  You develop fever of 100.5 F (38.1 C) or higher. SEEK IMMEDIATE MEDICAL CARE IF:   You cough up bloody sputum that had not been present before.  You develop fever of 102 F (38.9 C) or greater.  You develop worsening pain at or near the incision site. MAKE SURE YOU:   Understand these instructions.  Will watch your condition.  Will get help right away if you are not doing well or get worse. Document Released: 12/25/2006 Document Revised: 11/06/2011 Document Reviewed: 02/25/2007 [facility] Patient Information 2014 [place], [place].   ________________________________________________________________________

## 2015-01-08 ENCOUNTER — Encounter (HOSPITAL_COMMUNITY): Payer: Self-pay

## 2015-01-08 ENCOUNTER — Ambulatory Visit (HOSPITAL_COMMUNITY)
Admission: RE | Admit: 2015-01-08 | Discharge: 2015-01-08 | Disposition: A | Discharge status: Home / Self Care | Payer: Medicare Other | Source: Ambulatory Visit | Attending: Surgical | Admitting: Surgical

## 2015-01-08 ENCOUNTER — Encounter (HOSPITAL_COMMUNITY)
Admission: RE | Admit: 2015-01-08 | Discharge: 2015-01-08 | Disposition: A | Discharge status: Home / Self Care | Payer: Medicare Other | Source: Ambulatory Visit | Attending: Orthopedic Surgery | Admitting: Orthopedic Surgery

## 2015-01-08 DIAGNOSIS — Z01818 Encounter for other preprocedural examination: Secondary | ICD-10-CM | POA: Diagnosis not present

## 2015-01-08 DIAGNOSIS — M47816 Spondylosis without myelopathy or radiculopathy, lumbar region: Secondary | ICD-10-CM | POA: Diagnosis not present

## 2015-01-08 DIAGNOSIS — M48061 Spinal stenosis, lumbar region without neurogenic claudication: Secondary | ICD-10-CM

## 2015-01-08 HISTORY — DX: Dermatitis, unspecified: L30.9

## 2015-01-08 LAB — COMPREHENSIVE METABOLIC PANEL
ALT: 19 U/L (ref 17–63)
AST: 25 U/L (ref 15–41)
Albumin: 4.6 g/dL (ref 3.5–5.0)
Alkaline Phosphatase: 61 U/L (ref 38–126)
Anion gap: 9 (ref 5–15)
BUN: 16 mg/dL (ref 6–20)
CO2: 29 mmol/L (ref 22–32)
Calcium: 9.2 mg/dL (ref 8.9–10.3)
Chloride: 101 mmol/L (ref 101–111)
Creatinine, Ser: 0.75 mg/dL (ref 0.61–1.24)
GFR calc Af Amer: >60 mL/min (ref >60)
GFR calc non Af Amer: >60 mL/min (ref >60)
Glucose, Bld: 110 mg/dL — ABNORMAL HIGH (ref 65–99)
Potassium: 4 mmol/L (ref 3.5–5.1)
Sodium: 139 mmol/L (ref 135–145)
Total Bilirubin: 1.1 mg/dL (ref 0.3–1.2)
Total Protein: 7.4 g/dL (ref 6.5–8.1)

## 2015-01-08 LAB — CBC WITH DIFFERENTIAL/PLATELET
Basophils Absolute: 0 10*3/uL (ref 0.0–0.1)
Basophils Relative: 0 % (ref 0–1)
Eosinophils Absolute: 0 10*3/uL (ref 0.0–0.7)
Eosinophils Relative: 0 % (ref 0–5)
HCT: 40.9 % (ref 39.0–52.0)
Hemoglobin: 13.2 g/dL (ref 13.0–17.0)
Lymphocytes Relative: 8 % — ABNORMAL LOW (ref 12–46)
Lymphs Abs: 0.7 10*3/uL (ref 0.7–4.0)
MCH: 31.4 pg (ref 26.0–34.0)
MCHC: 32.3 g/dL (ref 30.0–36.0)
MCV: 97.4 fL (ref 78.0–100.0)
Monocytes Absolute: 0.3 10*3/uL (ref 0.1–1.0)
Monocytes Relative: 4 % (ref 3–12)
Neutro Abs: 7.5 10*3/uL (ref 1.7–7.7)
Neutrophils Relative %: 88 % — ABNORMAL HIGH (ref 43–77)
Platelets: 235 10*3/uL (ref 150–400)
RBC: 4.2 MIL/uL — ABNORMAL LOW (ref 4.22–5.81)
RDW: 13.9 % (ref 11.5–15.5)
WBC: 8.6 10*3/uL (ref 4.0–10.5)

## 2015-01-08 LAB — PROTIME-INR
INR: 1.49 (ref 0.00–1.49)
Prothrombin Time: 18.2 seconds — ABNORMAL HIGH (ref 11.6–15.2)

## 2015-01-08 LAB — SURGICAL PCR SCREEN
MRSA, PCR: NEGATIVE
Staphylococcus aureus: POSITIVE — AB

## 2015-01-08 LAB — APTT: aPTT: 30 seconds (ref 24–37)

## 2015-01-08 NOTE — Progress Notes (Signed)
PAT NOTE--Patient is aware of directions regarding his coumadin and lovonox bridging per coumadin clinic

## 2015-01-12 NOTE — H&P (Signed)
[name] is an 79 y.o. male.   Chief Complaint: lumbar spine pain HPI: The patient presented with the chief complaint of low back pain in February 2016. He reports no injury. He has been experiencing pain into the legs, down to his heels. No change in bladder or bowel function. CT myelogram showed widepsread lumbar spinal stenosis at L1-L2, L2-L3, L3-L4, L4-L5.   Past Medical History  Diagnosis Date  . HTN (hypertension)   . Atrial fibrillation   . Aortic stenosis   . Erectile dysfunction   . Vertigo   . Gout   . GERD (gastroesophageal reflux disease)   . DJD (degenerative joint disease)   . MVP (mitral valve prolapse)   . Hernia   . Baker cyst   . Eczema     Past Surgical History  Procedure Laterality Date  . Hernia repair    . Cartilage surgery    . Hernia repair    . Aortic valve replacement    . Right total hip    .  polyps vocal cord    . Rotator cuff repair Left     Family History  Problem Relation Age of Onset  . Diabetes    . Stroke     Social History:  reports that he has quit smoking. He has never used smokeless tobacco. He reports that he drinks alcohol. He reports that he does not use illicit drugs.  Allergies: No Known Allergies   Current outpatient prescriptions:  .  allopurinol (ZYLOPRIM) 300 MG tablet, TAKE ONE TABLET BY MOUTH EVERY DAY, Disp: 100 tablet, Rfl: 3 .  amLODipine (NORVASC) 5 MG tablet, One tab daily (Patient taking differently: Take 5 mg by mouth daily. One tab daily), Disp: 90 tablet, Rfl: 3 .  naproxen sodium (ANAPROX) 220 MG tablet, Take 440 mg by mouth every morning., Disp: , Rfl:  .  predniSONE (DELTASONE) 20 MG tablet, TAKE ONE-HALF TABLET BY MOUTH EVERY OTHER DAY, Disp: 50 tablet, Rfl: 0 .  traMADol (ULTRAM) 50 MG tablet, Take 50 mg by mouth every 4 (four) hours as needed for moderate pain., Disp: , Rfl:  .  traZODone (DESYREL) 50 MG tablet, TAKE 1 TAB AT BEDTIME AS NEEDED (TO REPLACE AMBIEN ), Disp: 90 tablet, Rfl: 3 .   warfarin (COUMADIN) 5 MG tablet, Take as directed by the coumadin clinic (Patient taking differently: Take 2.5-5 mg by mouth daily. Take as directed by the coumadin clinic-- states last dose 01/09/15), Disp: 90 tablet, Rfl: 1 .  enoxaparin (LOVENOX) 120 MG/0.8ML injection, Inject 0.8 mLs (120 mg total) into the skin daily. (Patient taking differently: Inject 120 mg into the skin daily. States will begin 01/12/15 x 3 days), Disp: 3 Syringe, Rfl: 0 .  sulfamethoxazole-trimethoprim (BACTRIM DS) 800-160 MG per tablet, Take 1 tablet by mouth 2 (two) times daily. (Patient not taking: Reported on 01/04/2015), Disp: 60 tablet, Rfl: 1 .  tamsulosin (FLOMAX) 0.4 MG CAPS capsule, Take 0.4 mg by mouth at bedtime., Disp: , Rfl:  .  Triamcinolone Acetonide (TRIAMCINOLONE 0.1 % CREAM : EUCERIN) CREA, Apply 1 application topically 2 (two) times daily. (Patient not taking: Reported on 01/04/2015), Disp: 1 each, Rfl: 3  Review of Systems  Constitutional: Negative.   HENT: Negative.   Eyes: Negative.   Respiratory: Positive for shortness of breath. Negative for cough, hemoptysis, sputum production and wheezing.        SOB with exertion  Cardiovascular: Positive for claudication. Negative for chest pain, palpitations, orthopnea, leg swelling  and PND.  Gastrointestinal: Negative.   Genitourinary: Positive for frequency. Negative for dysuria, urgency, hematuria and flank pain.  Musculoskeletal: Positive for back pain. Negative for myalgias, joint pain, falls and neck pain.  Skin: Negative.   Neurological: Negative.   Endo/Heme/Allergies: Negative.   Psychiatric/Behavioral: Negative.    Vitals  Weight: 169 lb Height: 69in Body Surface Area: 1.92 m Body Mass Index: 24.96 kg/m  Pulse: 72 (Regular)  BP: 160/80 (Sitting, Left Arm, Standard)  Physical Exam  Constitutional: He is oriented to person, place, and time. He appears well-developed and well-nourished. No distress.  HENT:  Head: Normocephalic and  atraumatic.  Right Ear: External ear normal.  Left Ear: External ear normal.  Nose: Nose normal.  Mouth/Throat: Oropharynx is clear and moist.  Eyes: Conjunctivae and EOM are normal.  Neck: Normal range of motion. Neck supple.  Cardiovascular: Normal rate and intact distal pulses.  An irregularly irregular rhythm present.  Murmur heard. Respiratory: Effort normal and breath sounds normal. No respiratory distress. He has no wheezes.  GI: Soft. Bowel sounds are normal. He exhibits no distension. There is no tenderness.  Musculoskeletal:       Right hip: Normal.       Left hip: Normal.       Right knee: Normal.       Left knee: Normal.       Lumbar back: He exhibits pain and spasm.  Neurological: He is alert and oriented to person, place, and time. He has normal strength and normal reflexes. No sensory deficit.  Skin: No rash noted. He is not diaphoretic. No erythema.  Psychiatric: He has a normal mood and affect. His behavior is normal.     Assessment/Plan Lumbar spinal stenosis He needs a decompressive lumbar laminectomy L1-L2, L2-L3, L3-L4, L4-L5. The possible complications of spinal surgery number one could be infection, which is extremely rare. We do use antibiotics prior to the surgery and during surgery and after surgery. Number two is always a slight degree of probability that you could develop a blood clot in your leg after any type of surgery and we try our best to prevent that with aspirin post op when it is safe to begin. The third is a dural leak. That is the spinal fluid leak that could occur. At certain rare times the bone or the disc could literally stick to the dura which is the lining which contains the spinal fluid and we could develop a small tear in that lining which we then patch up. That is an extremely rare complication. The last and final complication is a recurrent disc rupture. That means that you could rupture another small piece of disc later on down the road and  there is about a 2% chance of that. He will stay in the hospital about 48 hours.     [address] , PA-C   [place], [place] 01/12/2015, 11:18 AM

## 2015-01-15 ENCOUNTER — Inpatient Hospital Stay (HOSPITAL_COMMUNITY): Payer: Medicare Other

## 2015-01-15 ENCOUNTER — Encounter (HOSPITAL_COMMUNITY): Payer: Self-pay | Admitting: *Deleted

## 2015-01-15 ENCOUNTER — Observation Stay (HOSPITAL_COMMUNITY)
Admission: RE | Admit: 2015-01-15 | Discharge: 2015-01-16 | Disposition: A | Discharge status: Home / Self Care | Payer: Medicare Other | Source: Ambulatory Visit | Attending: Orthopedic Surgery | Admitting: Orthopedic Surgery

## 2015-01-15 ENCOUNTER — Encounter (HOSPITAL_COMMUNITY)
Admission: RE | Disposition: A | Discharge status: Home / Self Care | Payer: Self-pay | Source: Ambulatory Visit | Attending: Orthopedic Surgery

## 2015-01-15 ENCOUNTER — Inpatient Hospital Stay (HOSPITAL_COMMUNITY): Payer: Medicare Other | Admitting: Anesthesiology

## 2015-01-15 DIAGNOSIS — Z952 Presence of prosthetic heart valve: Secondary | ICD-10-CM | POA: Diagnosis not present

## 2015-01-15 DIAGNOSIS — M4806 Spinal stenosis, lumbar region: Principal | ICD-10-CM | POA: Insufficient documentation

## 2015-01-15 DIAGNOSIS — Z87891 Personal history of nicotine dependence: Secondary | ICD-10-CM | POA: Diagnosis not present

## 2015-01-15 DIAGNOSIS — I4891 Unspecified atrial fibrillation: Secondary | ICD-10-CM | POA: Insufficient documentation

## 2015-01-15 DIAGNOSIS — R52 Pain, unspecified: Secondary | ICD-10-CM

## 2015-01-15 DIAGNOSIS — M5136 Other intervertebral disc degeneration, lumbar region: Secondary | ICD-10-CM | POA: Diagnosis not present

## 2015-01-15 DIAGNOSIS — K219 Gastro-esophageal reflux disease without esophagitis: Secondary | ICD-10-CM | POA: Diagnosis not present

## 2015-01-15 DIAGNOSIS — Z4789 Encounter for other orthopedic aftercare: Secondary | ICD-10-CM | POA: Diagnosis not present

## 2015-01-15 DIAGNOSIS — M109 Gout, unspecified: Secondary | ICD-10-CM | POA: Insufficient documentation

## 2015-01-15 DIAGNOSIS — Z7901 Long term (current) use of anticoagulants: Secondary | ICD-10-CM | POA: Insufficient documentation

## 2015-01-15 DIAGNOSIS — I10 Essential (primary) hypertension: Secondary | ICD-10-CM | POA: Insufficient documentation

## 2015-01-15 DIAGNOSIS — M48062 Spinal stenosis, lumbar region with neurogenic claudication: Secondary | ICD-10-CM | POA: Diagnosis present

## 2015-01-15 HISTORY — PX: LUMBAR LAMINECTOMY/DECOMPRESSION MICRODISCECTOMY: SHX5026

## 2015-01-15 LAB — PROTIME-INR
INR: 1.02 (ref 0.00–1.49)
Prothrombin Time: 13.6 seconds (ref 11.6–15.2)

## 2015-01-15 SURGERY — LUMBAR LAMINECTOMY/DECOMPRESSION MICRODISCECTOMY 4 LEVEL
Anesthesia: General | Site: Back

## 2015-01-15 MED ORDER — NEOSTIGMINE METHYLSULFATE 10 MG/10ML IV SOLN
INTRAVENOUS | Status: DC | PRN
  Administered 2015-01-15: 4 mg via INTRAVENOUS

## 2015-01-15 MED ORDER — METHOCARBAMOL 500 MG PO TABS
500.0000 mg | ORAL_TABLET | Freq: Four times a day (QID) | ORAL | Status: DC | PRN
  Administered 2015-01-16: 500 mg via ORAL
  Filled 2015-01-15 (×2): qty 1

## 2015-01-15 MED ORDER — AMLODIPINE BESYLATE 5 MG PO TABS
5.0000 mg | ORAL_TABLET | Freq: Every day | ORAL | Status: DC
  Administered 2015-01-16: 5 mg via ORAL
  Filled 2015-01-15: qty 1

## 2015-01-15 MED ORDER — BISACODYL 5 MG PO TBEC: 5.0000 mg | DELAYED_RELEASE_TABLET | Freq: Every day | ORAL | Status: DC | PRN

## 2015-01-15 MED ORDER — ONDANSETRON HCL 4 MG/2ML IJ SOLN: 4.0000 mg | INTRAMUSCULAR | Status: DC | PRN

## 2015-01-15 MED ORDER — ONDANSETRON HCL 4 MG/2ML IJ SOLN
INTRAMUSCULAR | Status: AC
  Filled 2015-01-15: qty 2

## 2015-01-15 MED ORDER — BUPIVACAINE-EPINEPHRINE (PF) 0.5% -1:200000 IJ SOLN
INTRAMUSCULAR | Status: AC
  Filled 2015-01-15: qty 30

## 2015-01-15 MED ORDER — CEFAZOLIN SODIUM-DEXTROSE 2-3 GM-% IV SOLR
2.0000 g | INTRAVENOUS | Status: AC
  Administered 2015-01-15: 2 g via INTRAVENOUS

## 2015-01-15 MED ORDER — FLEET ENEMA 7-19 GM/118ML RE ENEM: 1.0000 | ENEMA | Freq: Once | RECTAL | Status: AC | PRN

## 2015-01-15 MED ORDER — ACETAMINOPHEN 10 MG/ML IV SOLN
1000.0000 mg | Freq: Once | INTRAVENOUS | Status: AC
  Administered 2015-01-15: 1000 mg via INTRAVENOUS
  Filled 2015-01-15: qty 100

## 2015-01-15 MED ORDER — POLYETHYLENE GLYCOL 3350 17 G PO PACK: 17.0000 g | PACK | Freq: Every day | ORAL | Status: DC | PRN

## 2015-01-15 MED ORDER — CEFAZOLIN SODIUM-DEXTROSE 2-3 GM-% IV SOLR
INTRAVENOUS | Status: AC
  Filled 2015-01-15: qty 50

## 2015-01-15 MED ORDER — PROPOFOL 10 MG/ML IV BOLUS
INTRAVENOUS | Status: AC
  Filled 2015-01-15: qty 20

## 2015-01-15 MED ORDER — CISATRACURIUM BESYLATE 20 MG/10ML IV SOLN
INTRAVENOUS | Status: AC
  Filled 2015-01-15: qty 10

## 2015-01-15 MED ORDER — HYDROMORPHONE HCL 1 MG/ML IJ SOLN: 0.5000 mg | INTRAMUSCULAR | Status: DC | PRN

## 2015-01-15 MED ORDER — DEXAMETHASONE SODIUM PHOSPHATE 10 MG/ML IJ SOLN
INTRAMUSCULAR | Status: AC
  Filled 2015-01-15: qty 1

## 2015-01-15 MED ORDER — HYDROMORPHONE HCL 2 MG/ML IJ SOLN
INTRAMUSCULAR | Status: AC
  Filled 2015-01-15: qty 1

## 2015-01-15 MED ORDER — GLYCOPYRROLATE 0.2 MG/ML IJ SOLN
INTRAMUSCULAR | Status: DC | PRN
  Administered 2015-01-15: 0.6 mg via INTRAVENOUS

## 2015-01-15 MED ORDER — FENTANYL CITRATE (PF) 250 MCG/5ML IJ SOLN
INTRAMUSCULAR | Status: AC
  Filled 2015-01-15: qty 5

## 2015-01-15 MED ORDER — LACTATED RINGERS IV SOLN: INTRAVENOUS | Status: DC

## 2015-01-15 MED ORDER — OXYCODONE-ACETAMINOPHEN 5-325 MG PO TABS
1.0000 | ORAL_TABLET | ORAL | Status: DC | PRN
  Administered 2015-01-15 – 2015-01-16 (×5): 1 via ORAL
  Filled 2015-01-15 (×2): qty 1
  Filled 2015-01-15: qty 2
  Filled 2015-01-15: qty 1
  Filled 2015-01-15: qty 2

## 2015-01-15 MED ORDER — ONDANSETRON HCL 4 MG/2ML IJ SOLN
INTRAMUSCULAR | Status: DC | PRN
  Administered 2015-01-15: 4 mg via INTRAVENOUS

## 2015-01-15 MED ORDER — CISATRACURIUM BESYLATE (PF) 10 MG/5ML IV SOLN
INTRAVENOUS | Status: DC | PRN
  Administered 2015-01-15 (×3): 2 mg via INTRAVENOUS
  Administered 2015-01-15: 6 mg via INTRAVENOUS
  Administered 2015-01-15: 2 mg via INTRAVENOUS

## 2015-01-15 MED ORDER — FENTANYL CITRATE (PF) 250 MCG/5ML IJ SOLN
INTRAMUSCULAR | Status: DC | PRN
  Administered 2015-01-15 (×5): 50 ug via INTRAVENOUS

## 2015-01-15 MED ORDER — METHOCARBAMOL 1000 MG/10ML IJ SOLN
500.0000 mg | Freq: Four times a day (QID) | INTRAVENOUS | Status: DC | PRN
  Filled 2015-01-15: qty 5

## 2015-01-15 MED ORDER — BACITRACIN-NEOMYCIN-POLYMYXIN 400-5-5000 EX OINT
TOPICAL_OINTMENT | CUTANEOUS | Status: AC
  Filled 2015-01-15: qty 1

## 2015-01-15 MED ORDER — NEOSTIGMINE METHYLSULFATE 10 MG/10ML IV SOLN
INTRAVENOUS | Status: AC
  Filled 2015-01-15: qty 1

## 2015-01-15 MED ORDER — HYDROMORPHONE HCL 1 MG/ML IJ SOLN
INTRAMUSCULAR | Status: DC | PRN
  Administered 2015-01-15: .2 mg via INTRAVENOUS

## 2015-01-15 MED ORDER — BUPIVACAINE LIPOSOME 1.3 % IJ SUSP
20.0000 mL | Freq: Once | INTRAMUSCULAR | Status: AC
  Administered 2015-01-15: 15 mL
  Filled 2015-01-15: qty 20

## 2015-01-15 MED ORDER — CEFAZOLIN SODIUM 1-5 GM-% IV SOLN
1.0000 g | Freq: Three times a day (TID) | INTRAVENOUS | Status: DC
  Administered 2015-01-16 (×2): 1 g via INTRAVENOUS
  Filled 2015-01-15 (×3): qty 50

## 2015-01-15 MED ORDER — HYDROMORPHONE HCL 1 MG/ML IJ SOLN
0.2500 mg | INTRAMUSCULAR | Status: DC | PRN
  Administered 2015-01-15: 0.5 mg via INTRAVENOUS

## 2015-01-15 MED ORDER — LACTATED RINGERS IV SOLN
INTRAVENOUS | Status: DC
  Administered 2015-01-15 (×2): via INTRAVENOUS
  Administered 2015-01-15: 1000 mL via INTRAVENOUS

## 2015-01-15 MED ORDER — TAMSULOSIN HCL 0.4 MG PO CAPS
0.4000 mg | ORAL_CAPSULE | Freq: Every day | ORAL | Status: DC
  Administered 2015-01-15 – 2015-01-16 (×2): 0.4 mg via ORAL
  Filled 2015-01-15 (×2): qty 1

## 2015-01-15 MED ORDER — SUCCINYLCHOLINE CHLORIDE 20 MG/ML IJ SOLN
INTRAMUSCULAR | Status: DC | PRN
  Administered 2015-01-15: 100 mg via INTRAVENOUS

## 2015-01-15 MED ORDER — BUPIVACAINE-EPINEPHRINE (PF) 0.5% -1:200000 IJ SOLN
INTRAMUSCULAR | Status: DC | PRN
  Administered 2015-01-15: 20 mL

## 2015-01-15 MED ORDER — SODIUM CHLORIDE 0.9 % IR SOLN
Status: DC | PRN
  Administered 2015-01-15: 500 mL

## 2015-01-15 MED ORDER — THROMBIN 5000 UNITS EX SOLR
CUTANEOUS | Status: AC
  Filled 2015-01-15: qty 10000

## 2015-01-15 MED ORDER — PROPOFOL 10 MG/ML IV BOLUS
INTRAVENOUS | Status: DC | PRN
  Administered 2015-01-15: 150 mg via INTRAVENOUS

## 2015-01-15 MED ORDER — HYDROMORPHONE HCL 1 MG/ML IJ SOLN
INTRAMUSCULAR | Status: AC
  Filled 2015-01-15: qty 1

## 2015-01-15 MED ORDER — ACETAMINOPHEN 325 MG PO TABS: 650.0000 mg | ORAL_TABLET | ORAL | Status: DC | PRN

## 2015-01-15 MED ORDER — PHENOL 1.4 % MT LIQD
1.0000 | OROMUCOSAL | Status: DC | PRN
  Filled 2015-01-15: qty 177

## 2015-01-15 MED ORDER — HYDROCODONE-ACETAMINOPHEN 5-325 MG PO TABS: 1.0000 | ORAL_TABLET | ORAL | Status: DC | PRN

## 2015-01-15 MED ORDER — THROMBIN 5000 UNITS EX SOLR
CUTANEOUS | Status: DC | PRN
  Administered 2015-01-15: 10000 [IU] via TOPICAL

## 2015-01-15 MED ORDER — BACITRACIN-NEOMYCIN-POLYMYXIN OINTMENT TUBE
TOPICAL_OINTMENT | CUTANEOUS | Status: DC | PRN
  Administered 2015-01-15: 1 via TOPICAL

## 2015-01-15 MED ORDER — MENTHOL 3 MG MT LOZG: 1.0000 | LOZENGE | OROMUCOSAL | Status: DC | PRN

## 2015-01-15 MED ORDER — DEXAMETHASONE SODIUM PHOSPHATE 10 MG/ML IJ SOLN
INTRAMUSCULAR | Status: DC | PRN
  Administered 2015-01-15: 10 mg via INTRAVENOUS

## 2015-01-15 MED ORDER — SODIUM CHLORIDE 0.9 % IR SOLN
Status: AC
  Filled 2015-01-15: qty 1

## 2015-01-15 MED ORDER — LACTATED RINGERS IV SOLN
INTRAVENOUS | Status: DC
  Administered 2015-01-16: 01:00:00 via INTRAVENOUS

## 2015-01-15 MED ORDER — GLYCOPYRROLATE 0.2 MG/ML IJ SOLN
INTRAMUSCULAR | Status: AC
  Filled 2015-01-15: qty 3

## 2015-01-15 MED ORDER — ALLOPURINOL 300 MG PO TABS
300.0000 mg | ORAL_TABLET | Freq: Every day | ORAL | Status: DC
  Administered 2015-01-15 – 2015-01-16 (×2): 300 mg via ORAL
  Filled 2015-01-15 (×2): qty 1

## 2015-01-15 MED ORDER — TRAZODONE HCL 50 MG PO TABS
50.0000 mg | ORAL_TABLET | Freq: Every day | ORAL | Status: DC
  Administered 2015-01-15: 50 mg via ORAL
  Filled 2015-01-15 (×2): qty 1

## 2015-01-15 MED ORDER — ACETAMINOPHEN 650 MG RE SUPP: 650.0000 mg | RECTAL | Status: DC | PRN

## 2015-01-15 SURGICAL SUPPLY — 46 items
BAG ZIPLOCK 12X15 (MISCELLANEOUS) ×3 IMPLANT
BENZOIN TINCTURE PRP APPL 2/3 (GAUZE/BANDAGES/DRESSINGS) ×3 IMPLANT
CLEANER TIP ELECTROSURG 2X2 (MISCELLANEOUS) ×3 IMPLANT
DRAIN PENROSE 18X1/4 LTX STRL (WOUND CARE) IMPLANT
DRAPE MICROSCOPE LEICA (MISCELLANEOUS) ×3 IMPLANT
DRAPE POUCH INSTRU U-SHP 10X18 (DRAPES) ×3 IMPLANT
DRAPE SHEET LG 3/4 BI-LAMINATE (DRAPES) ×3 IMPLANT
DRAPE SURG 17X11 SM STRL (DRAPES) ×3 IMPLANT
DRSG ADAPTIC 3X8 NADH LF (GAUZE/BANDAGES/DRESSINGS) ×3 IMPLANT
DRSG PAD ABDOMINAL 8X10 ST (GAUZE/BANDAGES/DRESSINGS) ×9 IMPLANT
DURAPREP 26ML APPLICATOR (WOUND CARE) ×3 IMPLANT
ELECT BLADE TIP CTD 4 INCH (ELECTRODE) ×3 IMPLANT
ELECT REM PT RETURN 9FT ADLT (ELECTROSURGICAL) ×3
ELECTRODE REM PT RTRN 9FT ADLT (ELECTROSURGICAL) ×1 IMPLANT
FLOSEAL 10ML (HEMOSTASIS) IMPLANT
GLOVE BIOGEL PI IND STRL 8 (GLOVE) ×1 IMPLANT
GLOVE BIOGEL PI INDICATOR 8 (GLOVE) ×2
GLOVE ECLIPSE 8.0 STRL XLNG CF (GLOVE) ×6 IMPLANT
GOWN STRL REUS W/TWL XL LVL3 (GOWN DISPOSABLE) ×6 IMPLANT
KIT BASIN OR (CUSTOM PROCEDURE TRAY) ×3 IMPLANT
KIT POSITIONING SURG ANDREWS (MISCELLANEOUS) ×3 IMPLANT
MANIFOLD NEPTUNE II (INSTRUMENTS) ×3 IMPLANT
NEEDLE HYPO 22GX1.5 SAFETY (NEEDLE) ×3 IMPLANT
NEEDLE SPNL 18GX3.5 QUINCKE PK (NEEDLE) ×6 IMPLANT
NS IRRIG 1000ML POUR BTL (IV SOLUTION) IMPLANT
PACK LAMINECTOMY ORTHO (CUSTOM PROCEDURE TRAY) ×3 IMPLANT
PAD ABD 8X10 STRL (GAUZE/BANDAGES/DRESSINGS) ×3 IMPLANT
PATTIES SURGICAL .5 X.5 (GAUZE/BANDAGES/DRESSINGS) ×3 IMPLANT
PATTIES SURGICAL .75X.75 (GAUZE/BANDAGES/DRESSINGS) IMPLANT
PATTIES SURGICAL 1X1 (DISPOSABLE) IMPLANT
PIN SAFETY NICK PLATE  2 MED (MISCELLANEOUS)
PIN SAFETY NICK PLATE 2 MED (MISCELLANEOUS) IMPLANT
POSITIONER SURGICAL ARM (MISCELLANEOUS) ×3 IMPLANT
SPONGE LAP 4X18 X RAY DECT (DISPOSABLE) ×18 IMPLANT
SPONGE SURGIFOAM ABS GEL 100 (HEMOSTASIS) ×3 IMPLANT
STAPLER VISISTAT 35W (STAPLE) ×6 IMPLANT
SUT VIC AB 0 CT1 27 (SUTURE) ×4
SUT VIC AB 0 CT1 27XBRD ANTBC (SUTURE) ×2 IMPLANT
SUT VIC AB 1 CT1 27 (SUTURE) ×6
SUT VIC AB 1 CT1 27XBRD ANTBC (SUTURE) ×3 IMPLANT
SYR 20CC LL (SYRINGE) ×6 IMPLANT
TAPE CLOTH SURG 6X10 WHT LF (GAUZE/BANDAGES/DRESSINGS) ×3 IMPLANT
TOWEL OR 17X26 10 PK STRL BLUE (TOWEL DISPOSABLE) ×3 IMPLANT
TOWEL OR NON WOVEN STRL DISP B (DISPOSABLE) IMPLANT
TRAY FOLEY CATH 16FRSI W/METER (SET/KITS/TRAYS/PACK) ×3 IMPLANT
TRAY FOLEY W/METER SILVER 14FR (SET/KITS/TRAYS/PACK) IMPLANT

## 2015-01-15 NOTE — Transfer of Care (Signed)
Immediate Anesthesia Transfer of Care Note  Patient: [name] Setting  Procedure(s) Performed: Procedure(s): 2 LEVEL DECOMPRESSIVE LUMBAR LAMINECTOMY L3-L4,L4-L5 (N/A)  Patient Location: PACU  Anesthesia Type:General  Level of Consciousness: awake, alert , oriented and patient cooperative  Airway & Oxygen Therapy: Patient Spontanous Breathing and Patient connected to face mask oxygen  Post-op Assessment: Report given to RN and Post -op Vital signs reviewed and stable  Post vital signs: Reviewed and stable  Last Vitals:  Filed Vitals:   01/15/15 1008  BP: 160/72  Pulse: 87  Temp: 36.4 C  Resp: 16    Complications: No apparent anesthesia complications

## 2015-01-15 NOTE — Brief Op Note (Signed)
01/15/2015  3:41 PM  PATIENT:  [name]  79 y.o. male  PRE-OPERATIVE DIAGNOSIS:  SPINAL STENOSIS at L-3-L-4 and L-4-L-5 and Foraminal Stenosis at BOTH Levels and BILATERALLY  POST-OPERATIVE DIAGNOSIS:  Same as Pre-Op  PROCEDURE:  Procedure(s): 2 LEVEL DECOMPRESSIVE LUMBAR LAMINECTOMY L3-L4,L4-L5 (N/A) and Foraminotomies at TWO Levels and BILATERALLY.  SURGEON:  Surgeon(s) and Role:    * Magnus Sinning, MD - Assisting    * Latanya Maudlin, MD - Primary    ASSISTANTS: Tarri Glenn MD  ANESTHESIA:   general  EBL:  Total I/O In: 1000 [I.V.:1000] Out: 500 [Urine:350; Blood:150]  BLOOD ADMINISTERED:none  DRAINS: none   LOCAL MEDICATIONS USED:  MARCAINE 20cc of 0.50% with Epinephrine at Start and 20cc of Exparel at end of case    SPECIMEN:  No Specimen  DISPOSITION OF SPECIMEN:  N/A  COUNTS:  YES  TOURNIQUET:  * No tourniquets in log *  DICTATION: .Other Dictation: Dictation Number [id number]  PLAN OF CARE: Admit for overnight observation  PATIENT DISPOSITION:  PACU - hemodynamically stable.   Delay start of Pharmacological VTE agent (>24hrs) due to surgical blood loss or risk of bleeding: yes

## 2015-01-15 NOTE — Anesthesia Preprocedure Evaluation (Addendum)
Anesthesia Evaluation  Patient identified by MRN, date of birth, ID band Patient awake    Reviewed: Allergy & Precautions, H&P , NPO status , Patient's Chart, lab work & pertinent test results  Airway Mallampati: II  TM Distance: >3 FB Neck ROM: full    Dental no notable dental hx. 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 on medications Normal cardiovascular exam+ dysrhythmias Atrial Fibrillation + Valvular Problems/Murmurs AS and MVP Rhythm:Irregular Rate:Normal  AVR. ECG - AF   Neuro/Psych Anxiety vertigo negative neurological ROS  negative psych ROS   GI/Hepatic negative GI ROS, Neg liver ROS, GERD-  Medicated and Controlled,  Endo/Other  negative endocrine ROS  Renal/GU negative Renal ROS  negative genitourinary   Musculoskeletal   Abdominal   Peds  Hematology negative hematology ROS (+)   Anesthesia Other Findings Prednisone 20 mg every other day  Reproductive/Obstetrics negative OB ROS                            Anesthesia Physical Anesthesia Plan  ASA: III  Anesthesia Plan: General   Post-op Pain Management:    Induction: Intravenous  Airway Management Planned: Oral ETT  Additional Equipment:   Intra-op Plan:   Post-operative Plan: Extubation in OR  Informed Consent: I have reviewed the patients History and Physical, chart, labs and discussed the procedure including the risks, benefits and alternatives for the proposed anesthesia with the patient or authorized representative who has indicated his/her understanding and acceptance.   Dental Advisory Given  Plan Discussed with: CRNA and Surgeon  Anesthesia Plan Comments:         Anesthesia Quick  Evaluation

## 2015-01-15 NOTE — Anesthesia Postprocedure Evaluation (Signed)
  Anesthesia Post-op Note  Patient: [name]  Procedure(s) Performed: Procedure(s) (LRB): 2 LEVEL DECOMPRESSIVE LUMBAR LAMINECTOMY L3-L4,L4-L5 (N/A)  Patient Location: PACU  Anesthesia Type: General  Level of Consciousness: awake and alert   Airway and Oxygen Therapy: Patient Spontanous Breathing  Post-op Pain: mild  Post-op Assessment: Post-op Vital signs reviewed, Patient's Cardiovascular Status Stable, Respiratory Function Stable, Patent Airway and No signs of Nausea or vomiting  Last Vitals:  Filed Vitals:   01/15/15 1645  BP: 172/94  Pulse: 87  Temp: 36.9 C  Resp: 16    Post-op Vital Signs: stable   Complications: No apparent anesthesia complications

## 2015-01-15 NOTE — Anesthesia Procedure Notes (Signed)
Procedure Name: Intubation Date/Time: 01/15/2015 1:54 PM Performed by: Dione Booze Pre-anesthesia Checklist: Patient identified, Emergency Drugs available, Suction available and Patient being monitored Patient Re-evaluated:Patient Re-evaluated prior to inductionOxygen Delivery Method: Circle system utilized Preoxygenation: Pre-oxygenation with 100% oxygen Intubation Type: IV induction Laryngoscope Size: Mac and 4 Grade View: Grade I Tube type: Oral Tube size: 7.5 mm Number of attempts: 1 Airway Equipment and Method: Stylet Placement Confirmation: ETT inserted through vocal cords under direct vision,  breath sounds checked- equal and bilateral and positive ETCO2 Secured at: 22 cm Tube secured with: Tape Dental Injury: Teeth and Oropharynx as per pre-operative assessment

## 2015-01-15 NOTE — Interval H&P Note (Signed)
History and Physical Interval Note:  01/15/2015 12:41 PM  CLINE DRAHEIM  has presented today for surgery, with the diagnosis of SPINAL STENOSIS  The various methods of treatment have been discussed with the patient and family. After consideration of risks, benefits and other options for treatment, the patient has consented to  Procedure(s): DECOMPRESSIVE LUMBER LAMINECTOMY OF L1-L2 L2-L3,L3-L4,L4-L5 (N/A) as a surgical intervention .  The patient's history has been reviewed, patient examined, no change in status, stable for surgery.  I have reviewed the patient's chart and labs.  Questions were answered to the patient's satisfaction.     Jaeleen Inzunza A

## 2015-01-15 NOTE — Plan of Care (Signed)
Problem: Consults Goal: Diagnosis - Spinal Surgery Decompression lumbar laminectomy

## 2015-01-16 DIAGNOSIS — Z952 Presence of prosthetic heart valve: Secondary | ICD-10-CM | POA: Diagnosis not present

## 2015-01-16 DIAGNOSIS — I10 Essential (primary) hypertension: Secondary | ICD-10-CM | POA: Diagnosis not present

## 2015-01-16 DIAGNOSIS — Z87891 Personal history of nicotine dependence: Secondary | ICD-10-CM | POA: Diagnosis not present

## 2015-01-16 DIAGNOSIS — I4891 Unspecified atrial fibrillation: Secondary | ICD-10-CM | POA: Diagnosis not present

## 2015-01-16 DIAGNOSIS — K219 Gastro-esophageal reflux disease without esophagitis: Secondary | ICD-10-CM | POA: Diagnosis not present

## 2015-01-16 DIAGNOSIS — M109 Gout, unspecified: Secondary | ICD-10-CM | POA: Diagnosis not present

## 2015-01-16 DIAGNOSIS — Z7901 Long term (current) use of anticoagulants: Secondary | ICD-10-CM | POA: Diagnosis not present

## 2015-01-16 DIAGNOSIS — M4806 Spinal stenosis, lumbar region: Secondary | ICD-10-CM | POA: Diagnosis not present

## 2015-01-16 MED ORDER — OXYCODONE-ACETAMINOPHEN 5-325 MG PO TABS: 1.0000 | ORAL_TABLET | ORAL | Status: DC | PRN

## 2015-01-16 MED ORDER — METHOCARBAMOL 500 MG PO TABS: 500.0000 mg | ORAL_TABLET | Freq: Four times a day (QID) | ORAL | Status: DC | PRN

## 2015-01-16 NOTE — Op Note (Signed)
[name], [name] NO.:  [id number]  MEDICAL RECORD NO.:  [id number]  LOCATION:  0932                         FACILITY:  [facility]  PHYSICIAN:  Kipp Brood. Renika Shiflet, M.D.DATE OF BIRTH:  10/21/1935  DATE OF PROCEDURE:  01/15/2015 DATE OF DISCHARGE:                              OPERATIVE REPORT   SURGEON:  Kipp Brood. Gladstone Lighter, M.D.  ASSISTANT:  Tarri Glenn, M.D.  PREOPERATIVE DIAGNOSES: 1. Severe spinal stenosis at L3-4. 2. Moderate stenosis at L4-5. 3. Spinal stenosis at L2-3 and L1-L2.  POSTOPERATIVE DIAGNOSES: 1. Severe spinal stenosis at L3-4. 2. Moderate stenosis at L4-5. 3. Spinal stenosis at L2-3 and L1-L2.  OPERATIONS: 1. Complete central decompressive lumbar laminectomy at L3-L4. 2. Complete decompressive lumbar laminectomy at L4-L5. 3. Foraminotomies bilaterally at both levels at L3-4 and L4-5.  DESCRIPTION OF PROCEDURE:  Under general anesthesia, routine orthopedic prepping and draping, the lower back was carried out.  The appropriate time-out was first carried out.  I also marked the back in the holding area.  His more pain was on the left.  He had bilateral leg pain and we were going centrally.  We did the appropriate time-out as I mentioned after sterile prepping and draping while he was on the spinal frame.  At this time, the patient had 2 g of IV Ancef.  Two needles were placed in the back for localization purposes.  X-ray was taken.  Following that, an incision was made over the L3-4 and L4-5 space, extended proximally and distally.  Bleeders were identified and cauterized.  At this particular point, after the x-rays were taken and again with instruments in place, we then stripped the muscle from the lamina and spinous processes proximally and distally, I then began my central decompression after we inserted the [facility] retractors.  Note, prior to making incision, I injected 20 mL of 0.5% Marcaine with epinephrine into the soft tissue  to control bleeding.  At this particular time, I then removed the spinous process of L3 and L4.  Part of L2 above as well.  I then went down and noted he had severe stenosis at L3-4.  We had to carefully do a wide decompression and the ligamentum flavum was extremely thickened.  Once we completed that, we went up toward L2 with a hockey-stick.  We had complete freedom of the dura once we get through the L3-4 region.  At that time, we felt there was no need to go up any further proximally.  The main issue was what we noted at 3-4 and microscope was brought in.  We went distally and took off L4-5, went out laterally and decompressed the recesses.  He had moderate-to-severe stenosis at 4-5.  The main component was at 3-4.  We decompressed the recesses bilaterally.  We were able to easily pass the hockey-stick beyond the L4-5 level and beyond the L3-4 level proximally.  We did foraminotomies bilaterally.  We were able to now easily pass the hockey- stick out the foramina bilaterally at both levels.  We thoroughly irrigated out the area.  Good hemostasis was maintained.  I loosely applied some thrombin-soaked Gelfoam.  I would like to state that x-rays were  taken with the instrument and after we did the decompression.  I then closed the wound with #1 Vicryl suture.  I left a small deep distal proximal and distal part of the wound open for drainage purposes.  After I closed the lumbar fascia, I injected 20 mL of Exparel and soft tissue. The subcu was closed with #1 Vicryl, skin with metal staples and a sterile Neosporin dressing was applied.  The patient left the operative room in satisfactory condition.          ______________________________ Kipp Brood Gladstone Lighter, M.D.     RAG/MEDQ  D:  01/15/2015  T:  01/16/2015  Job:  [id number]

## 2015-01-16 NOTE — Evaluation (Signed)
Occupational Therapy Evaluation Patient Details Name: [name] MRN: [id number] DOB: 05-16-1936 Today's Date: 01/16/2015    History of Present Illness s/p decomp L 3-4 and L 4-5   Clinical Impression   Patient evaluated by Occupational Therapy with no further acute OT needs identified. All education has been completed and the patient has no further questions. All education completed.  See below for any follow-up Occupational Therapy or equipment needs. OT is signing off. Thank you for this referral.      Follow Up Recommendations  No OT follow up;Supervision - Intermittent    Equipment Recommendations  None recommended by OT    Recommendations for Other Services       Precautions / Restrictions Precautions Precautions: Back Precaution Comments: Pt able to recall 3/3 precautions independently       Mobility Bed Mobility Overal bed mobility: Needs Assistance Bed Mobility: Rolling;Sidelying to Sit Rolling: Min guard Sidelying to sit: Min assist       General bed mobility comments: light min assist to bring trunk to full upright; tactile and verbal cues for back precautions and technique  Transfers Overall transfer level: Needs assistance Equipment used: Rolling walker (2 wheeled) Transfers: Sit to/from [facility] Sit to Stand: Supervision Stand pivot transfers: Supervision       General transfer comment: cues for hand placement and posture/back precautions    Balance Overall balance assessment: Needs assistance           Standing balance-Leahy Scale: Fair                              ADL Overall ADL's : Needs assistance/impaired Eating/Feeding: Independent;Sitting   Grooming: Wash/dry hands;Wash/dry face;Oral care;Brushing hair;Supervision/safety;Standing   Upper Body Bathing: Set up;Sitting   Lower Body Bathing: Sit to/from stand;Supervison/ safety;Adhering to hip precautions;With adaptive equipment   Upper Body  Dressing : Set up;Supervision/safety;Sitting   Lower Body Dressing: Supervision/safety;With adaptive equipment;Adhering to back precautions;Sit to/from stand   Toilet Transfer: Supervision/safety;Ambulation;Comfort height toilet;Regular Toilet;RW   Toileting- Clothing Manipulation and Hygiene: Supervision/safety;Sit to/from stand   Tub/ Shower Transfer: Supervision/safety;Ambulation;Shower seat;Rolling walker;Grab bars   Functional mobility during ADLs: Supervision/safety;Rolling walker General ADL Comments: Reviewed safety with ADLs with pt and signficant other.   Pt unable to access feet for LB ADLs.  Instructed them in use and acquisition of AE.  Pt able to return demonstration of use independently.  Instructed pt in safety with IADLs, and reviewed lifting precautions.  Also avoid bending during grooming activities.  He verbalized understanding of all      Vision     Perception     Praxis      Pertinent Vitals/Pain Pain Assessment: 0-10 Pain Score: 4  Pain Location: back  Pain Descriptors / Indicators: Aching Pain Intervention(s): Monitored during session     Hand Dominance     Extremity/Trunk Assessment Upper Extremity Assessment Upper Extremity Assessment: LUE deficits/detail LUE Deficits / Details: limited shoulder elevation due to RC injury    Lower Extremity Assessment Lower Extremity Assessment: Defer to PT evaluation   Cervical / Trunk Assessment Cervical / Trunk Assessment: Kyphotic   Communication     Cognition Arousal/Alertness: Awake/alert Behavior During Therapy: WFL for tasks assessed/performed Overall Cognitive Status: Within Functional Limits for tasks assessed                     General Comments       Exercises  Shoulder Instructions      Home Living Family/patient expects to be discharged to:: Private residence Living Arrangements: Alone Available Help at Discharge: Family;Friend(s) Type of Home: House Home Access: Stairs  to enter [employer] of Steps: 5  Entrance Stairs-Rails: Left Home Layout: Two level;Able to live on main level with bedroom/bathroom Alternate Level Stairs-Number of Steps: pt doesn't go upstairs   Bathroom Shower/Tub: Occupational psychologist: Standard     Home Equipment: Cane - single point;Crutches;Grab bars - tub/shower;Shower seat - built in   Additional Comments: can borrow RW      Prior Functioning/Environment Level of Independence: Independent             OT Diagnosis: Generalized weakness;Acute pain   OT Problem List:     OT Treatment/Interventions:      OT Goals(Current goals can be found in the care plan section) Acute Rehab OT Goals OT Goal Formulation: All assessment and education complete, DC therapy  OT Frequency:     Barriers to D/C:            Co-evaluation              End of Session Equipment Utilized During Treatment: Rolling walker Nurse Communication: Mobility status  Activity Tolerance: Patient tolerated treatment well Patient left: in chair;with call bell/phone within reach;with family/visitor present   Time: 1050-1118 OT Time Calculation (min): 28 min Charges:  OT General Charges $OT Visit: 1 Procedure OT Evaluation $Initial OT Evaluation Tier I: 1 Procedure OT Treatments $Self Care/Home Management : 8-22 mins G-Codes: OT G-codes **NOT FOR INPATIENT CLASS** Functional Limitation: Self care Self Care Current Status (A4497): At least 1 percent but less than 20 percent impaired, limited or restricted Self Care Goal Status (N3005): At least 1 percent but less than 20 percent impaired, limited or restricted Self Care Discharge Status [phone]): At least 1 percent but less than 20 percent impaired, limited or restricted  Jillianne Gamino M 01/16/2015, 11:30 AM

## 2015-01-16 NOTE — Progress Notes (Signed)
   Subjective: 1 Day Post-Op Procedure(s) (LRB): 2 LEVEL DECOMPRESSIVE LUMBAR LAMINECTOMY L3-L4,L4-L5 (N/A) Patient reports pain as mild.   Patient seen in rounds with Dr. Gladstone Lighter. Patient is well, and has had no acute complaints or problems. No issues overnight. Voiding well. No SOB or chest pain. Reports that his leg pain is better.    Objective: Vital signs in last 24 hours: Temp:  [97.6 F (36.4 C)-98.6 F (37 C)] 98.2 F (36.8 C) (05/21 0455) Pulse Rate:  [65-88] 82 (05/21 0455) Resp:  [15-20] 20 (05/21 0455) BP: (130-172)/(54-94) 154/73 mmHg (05/21 0455) SpO2:  [99 %-100 %] 100 % (05/21 0455) Weight:  [76.658 kg (169 lb)] 76.658 kg (169 lb) (05/20 1645)  Intake/Output from previous day:  Intake/Output Summary (Last 24 hours) at 01/16/15 0759 Last data filed at 01/16/15 0615  Gross per 24 hour  Intake   3795 ml  Output   3450 ml  Net    345 ml     Recent Labs  01/15/15 1030  INR 1.02    EXAM General - Patient is Alert and Oriented Extremity - Neurologically intact Intact pulses distally Dorsiflexion/Plantar flexion intact Compartment soft Dressing/Incision - clean, dry, no drainage Motor Function - intact, moving foot and toes well on exam.   Past Medical History  Diagnosis Date  . HTN (hypertension)   . Atrial fibrillation   . Aortic stenosis   . Erectile dysfunction   . Vertigo   . Gout   . GERD (gastroesophageal reflux disease)   . DJD (degenerative joint disease)   . MVP (mitral valve prolapse)   . Hernia   . Baker cyst   . Eczema     Assessment/Plan: 1 Day Post-Op Procedure(s) (LRB): 2 LEVEL DECOMPRESSIVE LUMBAR LAMINECTOMY L3-L4,L4-L5 (N/A) Active Problems:   Spinal stenosis, lumbar region, with neurogenic claudication  Estimated body mass index is 24.95 kg/(m^2) as calculated from the following:   Height as of this encounter: '5\' 9"'$  (1.753 m).   Weight as of this encounter: 76.658 kg (169 lb). Advance diet Up with therapy D/C IV  fluids  DVT Prophylaxis - Coumadin Weight-Bearing as tolerated   He is doing well. Will plan for DC home today after walking with therapy. DC instructions discussed.   Ardeen Jourdain, PA-C Orthopaedic Surgery 01/16/2015, 7:59 AM

## 2015-01-16 NOTE — Discharge Instructions (Signed)
Change your dressing daily. °Shower only, no tub bath. °Call if any temperatures greater than 101 or any wound complications: 545-5000 during the day and ask for Dr. Gioffre's nurse, Tammy Johnson. °

## 2015-01-16 NOTE — Progress Notes (Signed)
CSW received consult for possible snf placement. Per chart review, patient plan is for dc home today after walking with therapy. Please call CSW if needs arise for higher level of care. No further Clinical Social Work needs, signing off.   Belia Heman, [place]  Covering weekend csw

## 2015-01-16 NOTE — Evaluation (Signed)
Physical Therapy Evaluation Patient Details Name: [name] MRN: [id number] DOB: 1935-12-21 Today's Date: 01/16/2015   History of Present Illness  s/p decomp L 3-4 and L 4-5  Clinical Impression  Patient evaluated by Physical Therapy with no further acute PT needs identified. All education has been completed and the patient has no further questions.  See below for any follow-up Physial Therapy or equipment needs. PT is signing off. Thank you for this referral.     Follow Up Recommendations No PT follow up    Equipment Recommendations  None recommended by PT (plans to borrow RW)    Recommendations for Other Services       Precautions / Restrictions Precautions Precautions: Back Precaution Comments: handout issued      Mobility  Bed Mobility Overal bed mobility: Needs Assistance Bed Mobility: Rolling;Sidelying to Sit Rolling: Min guard Sidelying to sit: Min assist       General bed mobility comments: light min assist to bring trunk to full upright; tactile and verbal cues for back precautions and technique  Transfers Overall transfer level: Needs assistance Equipment used: Rolling walker (2 wheeled) Transfers: Sit to/from Stand Sit to Stand: Min guard         General transfer comment: cues for hand placement and posture/back precautions  Ambulation/Gait Ambulation/Gait assistance: Min guard;Supervision Ambulation Distance (Feet): 130 Feet Assistive device: Rolling walker (2 wheeled) Gait Pattern/deviations: Step-through pattern     General Gait Details: verbal cues for posture, RW position  Stairs Stairs: Yes Stairs assistance: Min guard Stair Management: Step to pattern;One rail Left Number of Stairs: 3 General stair comments: cues for sequence  Wheelchair Mobility    Modified Rankin (Stroke Patients Only)       Balance Overall balance assessment: Needs assistance           Standing balance-Leahy Scale: Fair                                Pertinent Vitals/Pain Pain Assessment: 0-10 Pain Score: 4  Pain Location: back  Pain Intervention(s): Limited activity within patient's tolerance    Home Living Family/patient expects to be discharged to:: Private residence Living Arrangements: Alone Available Help at Discharge: Family;Friend(s) Type of Home: House Home Access: Stairs to enter Entrance Stairs-Rails: Left Entrance Stairs-Number of Steps: 5  Home Layout: Two level;Able to live on main level with bedroom/bathroom Home Equipment: Cane - single point;Crutches;Grab bars - tub/shower;Shower seat - built in Additional Comments: can borrow RW    Prior Function Level of Independence: Independent               Hand Dominance        Extremity/Trunk Assessment   Upper Extremity Assessment: Defer to OT evaluation           Lower Extremity Assessment: Overall WFL for tasks assessed (pt reports muscle fatigue bil LEs)      Cervical / Trunk Assessment: Kyphotic  Communication      Cognition Arousal/Alertness: Awake/alert Behavior During Therapy: WFL for tasks assessed/performed Overall Cognitive Status: Within Functional Limits for tasks assessed                      General Comments      Exercises        Assessment/Plan    PT Assessment Patent does not need any further PT services  PT Diagnosis Difficulty walking   PT Problem List  PT Treatment Interventions     PT Goals (Current goals can be found in the Care Plan section) Acute Rehab PT Goals PT Goal Formulation: All assessment and education complete, DC therapy    Frequency     Barriers to discharge        Co-evaluation               End of Session   Activity Tolerance: Patient tolerated treatment well;No increased pain Patient left: in chair;with call bell/phone within reach Nurse Communication: Mobility status    Functional Assessment Tool Used: clinical judgement Functional Limitation:  Mobility: Walking and moving around Mobility: Walking and Moving Around Current Status (F7510): At least 1 percent but less than 20 percent impaired, limited or restricted Mobility: Walking and Moving Around Goal Status [phone]): At least 1 percent but less than 20 percent impaired, limited or restricted Mobility: Walking and Moving Around Discharge Status [phone]): At least 1 percent but less than 20 percent impaired, limited or restricted    Time: 2353-6144 PT Time Calculation (min) (ACUTE ONLY): 28 min   Charges:   PT Evaluation $Initial PT Evaluation Tier I: 1 Procedure PT Treatments $Gait Training: 8-22 mins   PT G Codes:   PT G-Codes **NOT FOR INPATIENT CLASS** Functional Assessment Tool Used: clinical judgement Functional Limitation: Mobility: Walking and moving around Mobility: Walking and Moving Around Current Status (R1540): At least 1 percent but less than 20 percent impaired, limited or restricted Mobility: Walking and Moving Around Goal Status [phone]): At least 1 percent but less than 20 percent impaired, limited or restricted Mobility: Walking and Moving Around Discharge Status [phone]): At least 1 percent but less than 20 percent impaired, limited or restricted    [facility] 01/16/2015, 10:26 AM

## 2015-01-17 ENCOUNTER — Emergency Department (HOSPITAL_COMMUNITY)
Admission: EM | Admit: 2015-01-17 | Discharge: 2015-01-17 | Disposition: A | Discharge status: Home / Self Care | Payer: Medicare Other | Attending: Emergency Medicine | Admitting: Emergency Medicine

## 2015-01-17 ENCOUNTER — Encounter (HOSPITAL_COMMUNITY): Payer: Self-pay

## 2015-01-17 DIAGNOSIS — Z872 Personal history of diseases of the skin and subcutaneous tissue: Secondary | ICD-10-CM | POA: Insufficient documentation

## 2015-01-17 DIAGNOSIS — M109 Gout, unspecified: Secondary | ICD-10-CM | POA: Diagnosis not present

## 2015-01-17 DIAGNOSIS — Z8719 Personal history of other diseases of the digestive system: Secondary | ICD-10-CM | POA: Insufficient documentation

## 2015-01-17 DIAGNOSIS — M1990 Unspecified osteoarthritis, unspecified site: Secondary | ICD-10-CM | POA: Diagnosis not present

## 2015-01-17 DIAGNOSIS — Z87891 Personal history of nicotine dependence: Secondary | ICD-10-CM | POA: Diagnosis not present

## 2015-01-17 DIAGNOSIS — I10 Essential (primary) hypertension: Secondary | ICD-10-CM | POA: Diagnosis not present

## 2015-01-17 DIAGNOSIS — Z954 Presence of other heart-valve replacement: Secondary | ICD-10-CM | POA: Diagnosis not present

## 2015-01-17 DIAGNOSIS — R404 Transient alteration of awareness: Secondary | ICD-10-CM | POA: Diagnosis not present

## 2015-01-17 DIAGNOSIS — Z79899 Other long term (current) drug therapy: Secondary | ICD-10-CM | POA: Insufficient documentation

## 2015-01-17 DIAGNOSIS — R531 Weakness: Secondary | ICD-10-CM | POA: Diagnosis not present

## 2015-01-17 DIAGNOSIS — Z87448 Personal history of other diseases of urinary system: Secondary | ICD-10-CM | POA: Diagnosis not present

## 2015-01-17 DIAGNOSIS — M549 Dorsalgia, unspecified: Secondary | ICD-10-CM | POA: Diagnosis not present

## 2015-01-17 DIAGNOSIS — Z7901 Long term (current) use of anticoagulants: Secondary | ICD-10-CM | POA: Insufficient documentation

## 2015-01-17 DIAGNOSIS — Z9889 Other specified postprocedural states: Secondary | ICD-10-CM | POA: Insufficient documentation

## 2015-01-17 DIAGNOSIS — I4891 Unspecified atrial fibrillation: Secondary | ICD-10-CM | POA: Insufficient documentation

## 2015-01-17 DIAGNOSIS — R339 Retention of urine, unspecified: Secondary | ICD-10-CM | POA: Insufficient documentation

## 2015-01-17 MED ORDER — AMLODIPINE BESYLATE 5 MG PO TABS
5.0000 mg | ORAL_TABLET | Freq: Once | ORAL | Status: AC
  Administered 2015-01-17: 5 mg via ORAL
  Filled 2015-01-17: qty 1

## 2015-01-17 MED ORDER — LIDOCAINE HCL 2 % EX GEL
CUTANEOUS | Status: AC
  Filled 2015-01-17: qty 10

## 2015-01-17 MED ORDER — HYDROMORPHONE HCL 1 MG/ML IJ SOLN
1.0000 mg | Freq: Once | INTRAMUSCULAR | Status: AC
  Administered 2015-01-17: 1 mg via INTRAMUSCULAR
  Filled 2015-01-17: qty 1

## 2015-01-17 NOTE — ED Notes (Signed)
Bed: WA21 Expected date:  Expected time:  Means of arrival:  Comments: EMS 79 yo male urinary retention/released from hospital yesterday/surgery for stenosis/atrial fib/hypertension

## 2015-01-17 NOTE — ED Provider Notes (Signed)
CSN: [id number]     Arrival date & time 01/17/15  0442 History   First MD Initiated Contact with Patient 01/17/15 0450     Chief Complaint  Patient presents with  . 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 Patient states he is Foley was removed yesterday and he was discharged. He has not urinated since that time around 2 PM. He is having worsening abdominal pain. He states that he has to go but nothing comes out. Patient denies any nausea vomiting, fevers, diarrhea. He denies having any problems with this previously. He has no further complaints at this time.   10 Systems reviewed and are negative for acute change except as noted in the HPI.   Past Medical History  Diagnosis Date  . HTN (hypertension)   . Atrial fibrillation   . Aortic stenosis   . Erectile dysfunction   . Vertigo   . Gout   . GERD (gastroesophageal reflux disease)   . DJD (degenerative joint disease)   . MVP (mitral valve prolapse)   . Hernia   . Baker cyst   . Eczema    Past Surgical History  Procedure Laterality Date  . Hernia repair    . Cartilage surgery    . Hernia repair    . Aortic valve replacement    . Right total hip    .  polyps vocal cord    . Rotator cuff repair Left    Family History  Problem Relation Age of Onset  . Diabetes    . Stroke     History  Substance Use Topics  . Smoking status: Former Research scientist (life sciences)  . Smokeless tobacco: Never Used     Comment: quit appox 6 years ago  . Alcohol Use: Yes    Review of Systems    Allergies  Review of patient's allergies indicates no known allergies.  Home Medications   Prior to Admission medications   Medication Sig Start Date End Date Taking?  Authorizing Provider  allopurinol (ZYLOPRIM) 300 MG tablet TAKE ONE TABLET BY MOUTH EVERY DAY 06/10/14   Dorena Cookey, MD  amLODipine (NORVASC) 5 MG tablet One tab daily Patient taking differently: Take 5 mg by mouth daily. One tab daily 06/10/14   Dorena Cookey, MD  methocarbamol (ROBAXIN) 500 MG tablet Take 1 tablet (500 mg total) by mouth every 6 (six) hours as needed for muscle spasms. 01/16/15   Amber Constable, PA-C  naproxen sodium (ANAPROX) 220 MG tablet Take 440 mg by mouth every morning.    Historical Provider, MD  oxyCODONE-acetaminophen (PERCOCET/ROXICET) 5-325 MG per tablet Take 1-2 tablets by mouth every 4 (four) hours as needed for moderate pain. 01/16/15   Amber Cecilio Asper, PA-C  predniSONE (DELTASONE) 20 MG tablet TAKE ONE-HALF TABLET BY MOUTH EVERY OTHER DAY 10/26/14   Dorena Cookey, MD  tamsulosin (FLOMAX) 0.4 MG CAPS capsule Take 0.4 mg by mouth at bedtime.    Historical Provider, MD  traZODone (DESYREL) 50 MG tablet TAKE 1 TAB AT BEDTIME AS NEEDED (TO REPLACE AMBIEN ) 06/10/14   Dorena Cookey, MD  warfarin (COUMADIN) 5 MG tablet Take as directed by the coumadin clinic Patient taking differently: Take 2.5-5 mg by mouth daily. Take as directed by the coumadin clinic-- states last dose 01/09/15 10/27/14  Dorena Cookey, MD   BP 204/90 mmHg  Pulse 108  Temp(Src) 98.4 F (36.9 C) (Oral)  Resp 18  SpO2 95% Physical Exam  Constitutional: He is oriented to person, place, and time. Vital signs are normal. He appears well-developed and well-nourished.  Non-toxic appearance. He does not appear ill. No distress.  HENT:  Head: Normocephalic and atraumatic.  Nose: Nose normal.  Mouth/Throat: Oropharynx is clear and moist. No oropharyngeal exudate.  Eyes: Conjunctivae and EOM are normal. Pupils are equal, round, and reactive to light. No scleral icterus.  Neck: Normal range of motion. Neck supple. No tracheal deviation, no edema, no erythema and normal range of motion present. No  thyroid mass and no thyromegaly present.  Cardiovascular: Normal rate, regular rhythm, S1 normal, S2 normal, normal heart sounds, intact distal pulses and normal pulses.  Exam reveals no gallop and no friction rub.   No murmur heard. Pulses:      Radial pulses are 2+ on the right side, and 2+ on the left side.       Dorsalis pedis pulses are 2+ on the right side, and 2+ on the left side.  Pulmonary/Chest: Effort normal and breath sounds normal. No respiratory distress. He has no wheezes. He has no rhonchi. He has no rales.  Abdominal: Soft. Normal appearance and bowel sounds are normal. He exhibits distension. He exhibits no ascites and no mass. There is no hepatosplenomegaly. There is tenderness. There is no rebound, no guarding and no CVA tenderness.  Suprapubic tenderness to palpation.  Musculoskeletal: Normal range of motion. He exhibits no edema or tenderness.  Lymphadenopathy:    He has no cervical adenopathy.  Neurological: He is alert and oriented to person, place, and time. He has normal strength. No cranial nerve deficit or sensory deficit. He exhibits normal muscle tone.  Skin: Skin is warm, dry and intact. No petechiae and no rash noted. He is not diaphoretic. No erythema. No pallor.  Psychiatric: He has a normal mood and affect. His behavior is normal. Judgment normal.  Nursing note and vitals reviewed.   ED Course  Procedures (including critical care time) Labs Review Labs Reviewed - No data to display  Imaging Review Dg Spine Portable 1 View  01/15/2015   CLINICAL DATA:  Intraoperative evaluation.  Please number vertebrae.  EXAM: PORTABLE SPINE - 1 VIEW  COMPARISON:  Portable intraoperative radiograph earlier same date.  FINDINGS: Portable intraoperative image number 4 demonstrates surgical hardware posterior to the L3 and L4 vertebral bodies.  IMPRESSION: Instruments demonstrated at the L3 and L4 levels.   Electronically Signed   By: Lovey Newcomer M.D.   On: 01/15/2015 15:31    Dg Spine Portable 1 View  01/15/2015   CLINICAL DATA:  Multilevel spinal stenosis and degenerative disc disease.  EXAM: PORTABLE SPINE - 1 VIEW  COMPARISON:  CT myelogram dated 11/30/2014  FINDINGS: Portable image 3 demonstrates an instrument at the L3-4 level.  IMPRESSION: Instrument at L3-4.   Electronically Signed   By: Lorriane Shire M.D.   On: 01/15/2015 14:27   Dg Spine Portable 1 View  01/15/2015   CLINICAL DATA:  Lumbar disc disease with spinal stenosis.  EXAM: PORTABLE SPINE - 1 VIEW  COMPARISON:  CT myelogram dated 11/30/2014  FINDINGS: Portable image 2 demonstrates instruments at L2-3 and L3-4 levels.  IMPRESSION: Instruments at L2-3 and L3-4.   Electronically Signed   By: Lorriane Shire M.D.   On: 01/15/2015 13:45   Dg Spine Portable 1  View  01/15/2015   CLINICAL DATA:  Decompressive laminectomy  EXAM: PORTABLE SPINE - 1 VIEW  COMPARISON:  01/08/2015  FINDINGS: Five lumbar type vertebral bodies are well visualized. Disc space narrowing is noted at all levels. Levels are numbered as per the previous numbering nomenclature. Surgical needles are noted posterior to the L3-4 and L4-5 interspace.  IMPRESSION: Intraoperative localization as described.   Electronically Signed   By: Inez Catalina M.D.   On: 01/15/2015 13:25     EKG Interpretation None      MDM   Final diagnoses:  Urinary retention    Patient since emergency department for urinary retention greater than 24 hours. Bladder scan shows 1100 mL of urine in the bladder. A Foley was placed in the emergency department. Patient was given intramuscular Dilaudid for pain relief. He denies any fevers or systemic symptoms, I do not believe urinalysis is necessary to evaluate for infection. Urology follow-up was provided for the patient, he was instructed to follow-up within the next 3 days for continued management of his Foley and urinary retention. He demonstrated understanding to this. Patient is hypertensive and tachycardic in the  emergency department, likely due to pain and urinary retention. He was given his dose of Norvasc today in emergency department. Will reevaluate vital signs prior to DC.  Review vital signs are blood pressure 149/72, pulse 102. Patient's workup was Foley placement. Patient is safe for discharge.  Everlene Balls, MD 01/17/15 [phone]

## 2015-01-17 NOTE — Discharge Instructions (Signed)
Acute Urinary Retention [name], see urology within 3 days for close follow-up of your urinary retention and to have your Foley evaluated. Your blood pressure today was very high, 204/90.  See your primary physician within 3 days for blood pressure control.  If any symptoms worsen before then come back to the emergency department immediately. Thank you. Acute urinary retention is when you are unable to pee (urinate). Acute urinary retention is common in older men. Prostates can get bigger, which blocks the flow of pee.  HOME CARE  Drink enough fluids to keep your pee clear or pale yellow.  If you are sent home with a tube that drains the bladder (catheter), there will be a drainage bag attached to it. There are two types of bags. One is big that you can wear at night without having to empty it. One is smaller and needs to be emptied more often.  Keep the drainage bag empty.  Keep the drainage bag lower than your catheter.  Only take medicine as told by your doctor. GET HELP IF:  You have a low-grade fever.  You have spasms or you are leaking pee when you have spasms. GET HELP RIGHT AWAY IF:   You have chills or a fever.  Your catheter stops draining pee.  Your catheter falls out.  You have increased bleeding that does not stop after you have rested and increased the amount of fluids you had been drinking. MAKE SURE YOU:   Understand these instructions.  Will watch your condition.  Will get help right away if you are not doing well or get worse. Document Released: 01/31/2008 Document Revised: 06/04/2013 Document Reviewed: 01/23/2013 [facility] Patient Information 2015 [place], [place]. This information is not intended to replace advice given to you by your health care provider. Make sure you discuss any questions you have with your health care provider. Hypertension Hypertension is another name for high blood pressure. High blood pressure forces your heart to work harder to pump  blood. A blood pressure reading has two numbers, which includes a higher number over a lower number (example: 110/72). HOME CARE   Have your blood pressure rechecked by your doctor.  Only take medicine as told by your doctor. Follow the directions carefully. The medicine does not work as well if you skip doses. Skipping doses also puts you at risk for problems.  Do not smoke.  Monitor your blood pressure at home as told by your doctor. GET HELP IF:  You think you are having a reaction to the medicine you are taking.  You have repeat headaches or feel dizzy.  You have puffiness (swelling) in your ankles.  You have trouble with your vision. GET HELP RIGHT AWAY IF:   You get a very bad headache and are confused.  You feel weak, numb, or faint.  You get chest or belly (abdominal) pain.  You throw up (vomit).  You cannot breathe very well. MAKE SURE YOU:   Understand these instructions.  Will watch your condition.  Will get help right away if you are not doing well or get worse. Document Released: 01/31/2008 Document Revised: 08/19/2013 Document Reviewed: 06/06/2013 [facility] Patient Information 2015 [place], [place]. This information is not intended to replace advice given to you by your health care provider. Make sure you discuss any questions you have with your health care provider.

## 2015-01-17 NOTE — ED Notes (Signed)
Patient arrives by EMS with complaints of urinary retention since 1400 yesterday after back surgery yesterday for stenosis

## 2015-01-18 ENCOUNTER — Encounter (HOSPITAL_COMMUNITY): Payer: Self-pay | Admitting: Orthopedic Surgery

## 2015-01-18 DIAGNOSIS — Z4789 Encounter for other orthopedic aftercare: Secondary | ICD-10-CM | POA: Diagnosis not present

## 2015-01-18 DIAGNOSIS — M1712 Unilateral primary osteoarthritis, left knee: Secondary | ICD-10-CM | POA: Diagnosis not present

## 2015-01-19 DIAGNOSIS — R339 Retention of urine, unspecified: Secondary | ICD-10-CM | POA: Diagnosis not present

## 2015-01-19 DIAGNOSIS — R338 Other retention of urine: Secondary | ICD-10-CM | POA: Diagnosis not present

## 2015-01-19 NOTE — Discharge Summary (Signed)
Physician Discharge Summary   Patient ID: [name] MRN: [id number] DOB/AGE: July 21, 1936 79 y.o.  Admit date: 01/15/2015 Discharge date: 01/16/2015  Primary Diagnosis:  Lumbar spinal stenosis   Admission Diagnoses:  Past Medical History  Diagnosis Date  . HTN (hypertension)   . Atrial fibrillation   . Aortic stenosis   . Erectile dysfunction   . Vertigo   . Gout   . GERD (gastroesophageal reflux disease)   . DJD (degenerative joint disease)   . MVP (mitral valve prolapse)   . Hernia   . Baker cyst   . Eczema    Discharge Diagnoses:   Active Problems:   Spinal stenosis, lumbar region, with neurogenic claudication  Estimated body mass index is 24.95 kg/(m^2) as calculated from the following:   Height as of this encounter: '5\' 9"'  (1.753 m).   Weight as of this encounter: 76.658 kg (169 lb).  Procedure:  Procedure(s) (LRB): 2 LEVEL DECOMPRESSIVE LUMBAR LAMINECTOMY L3-L4,L4-L5 (N/A)   Consults: None  HPI: The patient presented with the chief complaint of low back pain in February 2016. He reports no injury. He has been experiencing pain into the legs, down to his heels. No change in bladder or bowel function. CT myelogram showed widepsread lumbar spinal stenosis at L1-L2, L2-L3, L3-L4, L4-L5.   Laboratory Data: Admission on 01/15/2015, Discharged on 01/16/2015  Component Date Value Ref Range Status  . Prothrombin Time 01/15/2015 13.6  11.6 - 15.2 seconds Final  . INR 01/15/2015 1.02  0.00 - 1.49 Final  Hospital Outpatient Visit on 01/08/2015  Component Date Value Ref Range Status  . WBC 01/08/2015 8.6  4.0 - 10.5 K/uL Final  . RBC 01/08/2015 4.20* 4.22 - 5.81 MIL/uL Final  . Hemoglobin 01/08/2015 13.2  13.0 - 17.0 g/dL Final  . HCT 01/08/2015 40.9  39.0 - 52.0 % Final  . MCV 01/08/2015 97.4  78.0 - 100.0 fL Final  . MCH 01/08/2015 31.4  26.0 - 34.0 pg Final  . MCHC 01/08/2015 32.3  30.0 - 36.0 g/dL Final  . RDW 01/08/2015 13.9  11.5 - 15.5 % Final  . Platelets  01/08/2015 235  150 - 400 K/uL Final  . Neutrophils Relative % 01/08/2015 88* 43 - 77 % Final  . Neutro Abs 01/08/2015 7.5  1.7 - 7.7 K/uL Final  . Lymphocytes Relative 01/08/2015 8* 12 - 46 % Final  . Lymphs Abs 01/08/2015 0.7  0.7 - 4.0 K/uL Final  . Monocytes Relative 01/08/2015 4  3 - 12 % Final  . Monocytes Absolute 01/08/2015 0.3  0.1 - 1.0 K/uL Final  . Eosinophils Relative 01/08/2015 0  0 - 5 % Final  . Eosinophils Absolute 01/08/2015 0.0  0.0 - 0.7 K/uL Final  . Basophils Relative 01/08/2015 0  0 - 1 % Final  . Basophils Absolute 01/08/2015 0.0  0.0 - 0.1 K/uL Final  . Sodium 01/08/2015 139  135 - 145 mmol/L Final  . Potassium 01/08/2015 4.0  3.5 - 5.1 mmol/L Final  . Chloride 01/08/2015 101  101 - 111 mmol/L Final  . CO2 01/08/2015 29  22 - 32 mmol/L Final  . Glucose, Bld 01/08/2015 110* 65 - 99 mg/dL Final  . BUN 01/08/2015 16  6 - 20 mg/dL Final  . Creatinine, Ser 01/08/2015 0.75  0.61 - 1.24 mg/dL Final  . Calcium 01/08/2015 9.2  8.9 - 10.3 mg/dL Final  . Total Protein 01/08/2015 7.4  6.5 - 8.1 g/dL Final  . Albumin 01/08/2015 4.6  3.5 -  5.0 g/dL Final  . AST 01/08/2015 25  15 - 41 U/L Final  . ALT 01/08/2015 19  17 - 63 U/L Final  . Alkaline Phosphatase 01/08/2015 61  38 - 126 U/L Final  . Total Bilirubin 01/08/2015 1.1  0.3 - 1.2 mg/dL Final  . GFR calc non Af Amer 01/08/2015 >60  >60 mL/min Final  . GFR calc Af Amer 01/08/2015 >60  >60 mL/min Final   Comment: (NOTE) The eGFR has been calculated using the CKD EPI equation. This calculation has not been validated in all clinical situations. eGFR's persistently <60 mL/min signify possible Chronic Kidney Disease.   . Anion gap 01/08/2015 9  5 - 15 Final  . Prothrombin Time 01/08/2015 18.2* 11.6 - 15.2 seconds Final  . INR 01/08/2015 1.49  0.00 - 1.49 Final  . MRSA, PCR 01/08/2015 NEGATIVE  NEGATIVE Final  . Staphylococcus aureus 01/08/2015 POSITIVE* NEGATIVE Final   Comment:        The Xpert SA Assay (FDA approved  for NASAL specimens in patients over 78 years of age), is one component of a comprehensive surveillance program.  Test performance has been validated by [facility] for patients greater than or equal to 28 year old. It is not intended to diagnose infection nor to guide or monitor treatment.   Marland Kitchen aPTT 01/08/2015 30  24 - 37 seconds Final  Anti-coag visit on 12/16/2014  Component Date Value Ref Range Status  . INR 12/16/2014 2.7   Final  Anti-coag visit on 12/03/2014  Component Date Value Ref Range Status  . INR 12/03/2014 1.4   Final  Anti-coag visit on 11/30/2014  Component Date Value Ref Range Status  . INR 11/30/2014 1.2   Final     X-Rays:Dg Lumbar Spine 2-3 Views  01/08/2015   CLINICAL DATA:  Preop back surgery.  Number vertebra  EXAM: LUMBAR SPINE - 2-3 VIEW  COMPARISON:  CT myelogram 11/30/2014  FINDINGS: Five non rib-bearing lumbar segments. Lowest disc space L5-S1. This is consistent with numbering on the CT scan. Lumbar vertebra were numbered for preoperative planning.  Normal alignment.  Negative for fracture.  Advanced disc degeneration and spondylosis throughout the lumbar spine from L1 through S1 with large posterior osteophytes causing spinal stenosis.  Right hip replacement. Moderate degenerative change in the left hip.  IMPRESSION: Severe lumbar spondylosis.   Electronically Signed   By: Franchot Gallo M.D.   On: 01/08/2015 12:17   Dg Spine Portable 1 View  01/15/2015   CLINICAL DATA:  Intraoperative evaluation.  Please number vertebrae.  EXAM: PORTABLE SPINE - 1 VIEW  COMPARISON:  Portable intraoperative radiograph earlier same date.  FINDINGS: Portable intraoperative image number 4 demonstrates surgical hardware posterior to the L3 and L4 vertebral bodies.  IMPRESSION: Instruments demonstrated at the L3 and L4 levels.   Electronically Signed   By: Lovey Newcomer M.D.   On: 01/15/2015 15:31   Dg Spine Portable 1 View  01/15/2015   CLINICAL DATA:  Multilevel spinal stenosis  and degenerative disc disease.  EXAM: PORTABLE SPINE - 1 VIEW  COMPARISON:  CT myelogram dated 11/30/2014  FINDINGS: Portable image 3 demonstrates an instrument at the L3-4 level.  IMPRESSION: Instrument at L3-4.   Electronically Signed   By: Lorriane Shire M.D.   On: 01/15/2015 14:27   Dg Spine Portable 1 View  01/15/2015   CLINICAL DATA:  Lumbar disc disease with spinal stenosis.  EXAM: PORTABLE SPINE - 1 VIEW  COMPARISON:  CT myelogram dated  11/30/2014  FINDINGS: Portable image 2 demonstrates instruments at L2-3 and L3-4 levels.  IMPRESSION: Instruments at L2-3 and L3-4.   Electronically Signed   By: Lorriane Shire M.D.   On: 01/15/2015 13:45   Dg Spine Portable 1 View  01/15/2015   CLINICAL DATA:  Decompressive laminectomy  EXAM: PORTABLE SPINE - 1 VIEW  COMPARISON:  01/08/2015  FINDINGS: Five lumbar type vertebral bodies are well visualized. Disc space narrowing is noted at all levels. Levels are numbered as per the previous numbering nomenclature. Surgical needles are noted posterior to the L3-4 and L4-5 interspace.  IMPRESSION: Intraoperative localization as described.   Electronically Signed   By: Inez Catalina M.D.   On: 01/15/2015 13:25    EKG: Orders placed or performed in visit on 12/29/14  . EKG 12-Lead     Hospital Course: ADIT RIDDLES is a 79 y.o. who was admitted to [facility]. They were brought to the operating room on 01/15/2015 and underwent Procedure(s): 2 LEVEL DECOMPRESSIVE LUMBAR LAMINECTOMY L3-L4,L4-L5.  Patient tolerated the procedure well and was later transferred to the recovery room and then to the orthopaedic floor for postoperative care.  They were given PO and IV analgesics for pain control following their surgery.  They were given 24 hours of postoperative antibiotics of  Anti-infectives    Start     Dose/Rate Route Frequency Ordered Stop   01/16/15 0000  ceFAZolin (ANCEF) IVPB 1 g/50 mL premix  Status:  Discontinued     1 g 100 mL/hr over 30 Minutes  Intravenous Every 8 hours 01/15/15 1657 01/16/15 1643   01/15/15 1330  polymyxin B 500,000 Units, bacitracin 50,000 Units in sodium chloride irrigation 0.9 % 500 mL irrigation  Status:  Discontinued       As needed 01/15/15 1330 01/15/15 1545   01/15/15 1009  ceFAZolin (ANCEF) IVPB 2 g/50 mL premix     2 g 100 mL/hr over 30 Minutes Intravenous On call to O.R. 01/15/15 1009 01/15/15 1309     and started on DVT prophylaxis in the form of Coumadin (resumed ).   PT was ordered.   Discharge planning consulted to help with postop disposition and equipment needs.  Patient had a fair night on the evening of surgery.  They started to get up OOB with therapy on day one.Patient was seen in rounds and was ready to go home.   Diet: Cardiac diet Activity:WBAT Follow-up:in 2 weeks Disposition - Home Discharged Condition: stable   Discharge Instructions    Call MD / Call 911    Complete by:  As directed   If you experience chest pain or shortness of breath, CALL 911 and be transported to the hospital emergency room.  If you develope a fever above 101 F, pus (white drainage) or increased drainage or redness at the wound, or calf pain, call your surgeon's office.     Constipation Prevention    Complete by:  As directed   Drink plenty of fluids.  Prune juice may be helpful.  You may use a stool softener, such as Colace (over the counter) 100 mg twice a day.  Use MiraLax (over the counter) for constipation as needed.     Diet - low sodium heart healthy    Complete by:  As directed      Discharge instructions    Complete by:  As directed   Change your dressing daily. Shower only, no tub bath. Call if any temperatures greater than 101 or  any wound complications: [phone] during the day and ask for Dr. Charlestine Night nurse, Brunilda Payor.     Increase activity slowly as tolerated    Complete by:  As directed      Lifting restrictions    Complete by:  As directed   No lifting            Medication List     STOP taking these medications        enoxaparin 120 MG/0.8ML injection  Commonly known as:  LOVENOX     sulfamethoxazole-trimethoprim 800-160 MG per tablet  Commonly known as:  BACTRIM DS     traMADol 50 MG tablet  Commonly known as:  ULTRAM     triamcinolone 0.1 % cream : eucerin Crea      TAKE these medications        allopurinol 300 MG tablet  Commonly known as:  ZYLOPRIM  TAKE ONE TABLET BY MOUTH EVERY DAY     amLODipine 5 MG tablet  Commonly known as:  NORVASC  One tab daily     methocarbamol 500 MG tablet  Commonly known as:  ROBAXIN  Take 1 tablet (500 mg total) by mouth every 6 (six) hours as needed for muscle spasms.     naproxen sodium 220 MG tablet  Commonly known as:  ANAPROX  Take 440 mg by mouth every morning.     oxyCODONE-acetaminophen 5-325 MG per tablet  Commonly known as:  PERCOCET/ROXICET  Take 1-2 tablets by mouth every 4 (four) hours as needed for moderate pain.     predniSONE 20 MG tablet  Commonly known as:  DELTASONE  TAKE ONE-HALF TABLET BY MOUTH EVERY OTHER DAY     tamsulosin 0.4 MG Caps capsule  Commonly known as:  FLOMAX  Take 0.4 mg by mouth at bedtime.     traZODone 50 MG tablet  Commonly known as:  DESYREL  TAKE 1 TAB AT BEDTIME AS NEEDED (TO REPLACE AMBIEN )     warfarin 5 MG tablet  Commonly known as:  COUMADIN  Take as directed by the coumadin clinic           Follow-up Information    Follow up with GIOFFRE,RONALD A, MD. Schedule an appointment as soon as possible for a visit in 2 weeks.   Specialty:  Orthopedic Surgery   Contact information:   [address] [place] [address] [phone]       Signed: Ardeen Jourdain, PA-C Orthopaedic Surgery 01/19/2015, 10:22 AM

## 2015-01-20 ENCOUNTER — Telehealth: Payer: Self-pay | Admitting: Family Medicine

## 2015-01-20 NOTE — Telephone Encounter (Signed)
Please call patient

## 2015-01-27 ENCOUNTER — Ambulatory Visit (INDEPENDENT_AMBULATORY_CARE_PROVIDER_SITE_OTHER): Payer: Medicare Other | Admitting: *Deleted

## 2015-01-27 DIAGNOSIS — I359 Nonrheumatic aortic valve disorder, unspecified: Secondary | ICD-10-CM

## 2015-01-27 DIAGNOSIS — I482 Chronic atrial fibrillation, unspecified: Secondary | ICD-10-CM

## 2015-01-27 DIAGNOSIS — Z5181 Encounter for therapeutic drug level monitoring: Secondary | ICD-10-CM

## 2015-01-27 LAB — POCT INR: INR: 3

## 2015-01-27 NOTE — Progress Notes (Signed)
Pre visit review using our clinic review tool, if applicable. No additional management support is needed unless otherwise documented below in the visit note. 

## 2015-01-27 NOTE — Progress Notes (Signed)
I have reviewed and agree with the plan. 

## 2015-02-03 DIAGNOSIS — H2511 Age-related nuclear cataract, right eye: Secondary | ICD-10-CM | POA: Diagnosis not present

## 2015-02-04 ENCOUNTER — Other Ambulatory Visit: Payer: Self-pay | Admitting: Family Medicine

## 2015-02-23 ENCOUNTER — Other Ambulatory Visit: Payer: Self-pay | Admitting: Surgical

## 2015-02-24 ENCOUNTER — Ambulatory Visit: Payer: Medicare Other

## 2015-03-03 ENCOUNTER — Ambulatory Visit (INDEPENDENT_AMBULATORY_CARE_PROVIDER_SITE_OTHER): Payer: Medicare Other | Admitting: General Practice

## 2015-03-03 DIAGNOSIS — I359 Nonrheumatic aortic valve disorder, unspecified: Secondary | ICD-10-CM | POA: Diagnosis not present

## 2015-03-03 DIAGNOSIS — Z5181 Encounter for therapeutic drug level monitoring: Secondary | ICD-10-CM | POA: Diagnosis not present

## 2015-03-03 DIAGNOSIS — I4891 Unspecified atrial fibrillation: Secondary | ICD-10-CM | POA: Diagnosis not present

## 2015-03-03 LAB — POCT INR: INR: 4.2

## 2015-03-03 NOTE — Progress Notes (Signed)
Pre visit review using our clinic review tool, if applicable. No additional management support is needed unless otherwise documented below in the visit note. 

## 2015-03-03 NOTE — Progress Notes (Signed)
I have reviewed and agree with the plan. 

## 2015-03-22 ENCOUNTER — Telehealth: Payer: Self-pay | Admitting: Cardiology

## 2015-03-22 DIAGNOSIS — R609 Edema, unspecified: Secondary | ICD-10-CM

## 2015-03-22 MED ORDER — FUROSEMIDE 20 MG PO TABS: ORAL_TABLET | ORAL | Status: DC

## 2015-03-22 NOTE — Telephone Encounter (Signed)
Pt says his legs have been swelling and short of breath. He said Dr Stanford Breed said if he had any problems call immediately.

## 2015-03-22 NOTE — Telephone Encounter (Signed)
Spoke with pt, he has noticed swelling in his feet for about 1 and 1/2 weeks. They are down in the am but by lunch time they are swollen. He denies orthopnea and will get SOB after walking 1/2 block. He weight has been the same. He is no longer taking the prednisone. Will forward for dr Stanford Breed review

## 2015-03-22 NOTE — Telephone Encounter (Signed)
Lasix 20 mg daily as needed for edema; bmet 1 week Kirk Ruths

## 2015-03-22 NOTE — Telephone Encounter (Signed)
Spoke with pt, Aware of dr crenshaw's recommendations. New script sent to the pharmacy  

## 2015-03-23 DIAGNOSIS — R609 Edema, unspecified: Secondary | ICD-10-CM | POA: Diagnosis not present

## 2015-03-23 LAB — BASIC METABOLIC PANEL WITH GFR
BUN: 10 mg/dL (ref 7–25)
CO2: 30 mEq/L (ref 20–31)
Calcium: 9.2 mg/dL (ref 8.6–10.3)
Chloride: 104 mEq/L (ref 98–110)
Creat: 0.86 mg/dL (ref 0.70–1.18)
GFR, Est African American: >89 mL/min (ref >=60)
GFR, Est Non African American: 84 mL/min (ref >=60)
Glucose, Bld: 75 mg/dL (ref 65–99)
Potassium: 4.3 mEq/L (ref 3.5–5.3)
Sodium: 141 mEq/L (ref 135–146)

## 2015-03-24 ENCOUNTER — Ambulatory Visit (INDEPENDENT_AMBULATORY_CARE_PROVIDER_SITE_OTHER): Payer: Medicare Other | Admitting: General Practice

## 2015-03-24 ENCOUNTER — Telehealth: Payer: Self-pay | Admitting: General Practice

## 2015-03-24 ENCOUNTER — Other Ambulatory Visit: Payer: Self-pay | Admitting: General Practice

## 2015-03-24 DIAGNOSIS — I4891 Unspecified atrial fibrillation: Secondary | ICD-10-CM | POA: Diagnosis not present

## 2015-03-24 DIAGNOSIS — Z5181 Encounter for therapeutic drug level monitoring: Secondary | ICD-10-CM | POA: Diagnosis not present

## 2015-03-24 DIAGNOSIS — I359 Nonrheumatic aortic valve disorder, unspecified: Secondary | ICD-10-CM

## 2015-03-24 LAB — POCT INR: INR: 1.9

## 2015-03-24 MED ORDER — ENOXAPARIN SODIUM 120 MG/0.8ML ~~LOC~~ SOLN: 1.5000 mg/kg | SUBCUTANEOUS | Status: DC

## 2015-03-24 NOTE — Progress Notes (Signed)
Pre visit review using our clinic review tool, if applicable. No additional management support is needed unless otherwise documented below in the visit note. 

## 2015-03-24 NOTE — Patient Instructions (Addendum)
8/12 - Take last dose of coumadin 8/13 - Nothing 8/14 - Take Lovenox in the AM 8/15 - Take Lovenox in the AM 8/16 - Take Lovenox in the AM 8/17 - Procedure (No Lovenox) 8/18 - Lovenox and 1 1/2 tablets of coumadin 8/19 - Lovenox and 1 1/2 tablets of coumadin 8/20 - Lovenox and 1 tablet of coumadin 8/21 - Lovenox and 1 tablet of coumadin 8/22 - Continue to take regular dosage of coumadin

## 2015-03-24 NOTE — Telephone Encounter (Signed)
-----   Message from Lelon Perla, MD sent at 03/24/2015 11:51 AM EDT ----- Regarding: RE: Lovenox bridge Yes Kirk Ruths  ----- Message -----    From: Warden Fillers, RN    Sent: 03/24/2015  11:17 AM      To: Lelon Perla, MD Subject: Lovenox bridge                                 Patient is having a knee replacement on 8/17 and will need to stop coumadin for 5 days.  Do you want a Lovenox bridge?  Thanks, [name], RN

## 2015-03-24 NOTE — Progress Notes (Signed)
I have reviewed and agree with the plan. 

## 2015-03-29 NOTE — Patient Instructions (Addendum)
[name]  03/29/2015   Your procedure is scheduled on:  Wednesday  04/14/2015    Report to [facility] Main  Entrance take [facility]  elevators to 3rd floor to  [place] at   [place] AM.  Call this number if you have problems the morning of surgery [phone]   Remember: ONLY 1 PERSON MAY GO WITH YOU TO SHORT STAY TO GET  READY MORNING OF [place].  Do not eat food or drink liquids :After Midnight.     Take these medicines the morning of surgery with A SIP OF WATER:   Allopurinol, Amlodipine ( Norvasc)                               You may not have any metal on your body including hair pins and              piercings  Do not wear jewelry,  lotions, powders or perfumes, deodorant             Men may shave face and neck.  Do not bring valuables to the hospital. [place].  Contacts, dentures or bridgework may not be worn into surgery.  Leave suitcase in the car. After surgery it may be brought to your room.               Please read over the following fact sheets you were given: MRSA information  _____________________________________________________________________           [facility] - Preparing for Surgery Before surgery, you can play an important role.  Because skin is not sterile, your skin needs to be as free of germs as possible.  You can reduce the number of germs on your skin by washing with CHG (chlorahexidine gluconate) soap before surgery.  CHG is an antiseptic cleaner which kills germs and bonds with the skin to continue killing germs even after washing. Please DO NOT use if you have an allergy to CHG or antibacterial soaps.  If your skin becomes reddened/irritated stop using the CHG and inform your nurse when you arrive at Short Stay. Do not shave (including legs and underarms) for at least 48 hours prior to the first CHG shower.  You may shave your face/neck. Please follow these  instructions carefully:  1.  Shower with CHG Soap the night before surgery and the  morning of Surgery.  2.  If you choose to wash your hair, wash your hair first as usual with your  normal  shampoo.  3.  After you shampoo, rinse your hair and body thoroughly to remove the  shampoo.                            4.  Use CHG as you would any other liquid soap.  You can apply chg directly  to the skin and wash                       Gently with a scrungie or clean washcloth.  5.  Apply the CHG Soap to your body ONLY FROM THE NECK DOWN.   Do not use on face/ open  Wound or open sores. Avoid contact with eyes, ears mouth and genitals (private parts).                       Wash face,  Genitals (private parts) with your normal soap.             6.  Wash thoroughly, paying special attention to the area where your surgery  will be performed.  7.  Thoroughly rinse your body with warm water from the neck down.  8.  DO NOT shower/wash with your normal soap after using and rinsing off  the CHG Soap.                9.  Pat yourself dry with a clean towel.            10.  Wear clean pajamas.            11.  Place clean sheets on your bed the night of your first shower and do not  sleep with pets. Day of Surgery : Do not apply any lotions/deodorants the morning of surgery.  Please wear clean clothes to the hospital/surgery center.  FAILURE TO FOLLOW THESE INSTRUCTIONS MAY RESULT IN THE CANCELLATION OF YOUR SURGERY PATIENT SIGNATURE_________________________________  NURSE SIGNATURE__________________________________  ________________________________________________________________________   [name]  An incentive spirometer is a tool that can help keep your lungs clear and active. This tool measures how well you are filling your lungs with each breath. Taking long deep breaths may help reverse or decrease the chance of developing breathing (pulmonary) problems (especially  infection) following:  A long period of time when you are unable to move or be active. BEFORE THE PROCEDURE   If the spirometer includes an indicator to show your best effort, your nurse or respiratory therapist will set it to a desired goal.  If possible, sit up straight or lean slightly forward. Try not to slouch.  Hold the incentive spirometer in an upright position. INSTRUCTIONS FOR USE   Sit on the edge of your bed if possible, or sit up as far as you can in bed or on a chair.  Hold the incentive spirometer in an upright position.  Breathe out normally.  Place the mouthpiece in your mouth and seal your lips tightly around it.  Breathe in slowly and as deeply as possible, raising the piston or the ball toward the top of the column.  Hold your breath for 3-5 seconds or for as long as possible. Allow the piston or ball to fall to the bottom of the column.  Remove the mouthpiece from your mouth and breathe out normally.  Rest for a few seconds and repeat Steps 1 through 7 at least 10 times every 1-2 hours when you are awake. Take your time and take a few normal breaths between deep breaths.  The spirometer may include an indicator to show your best effort. Use the indicator as a goal to work toward during each repetition.  After each set of 10 deep breaths, practice coughing to be sure your lungs are clear. If you have an incision (the cut made at the time of surgery), support your incision when coughing by placing a pillow or rolled up towels firmly against it. Once you are able to get out of bed, walk around indoors and cough well. You may stop using the incentive spirometer when instructed by your caregiver.  RISKS AND COMPLICATIONS  Take your time so you do not get  dizzy or light-headed.  If you are in pain, you may need to take or ask for pain medication before doing incentive spirometry. It is harder to take a deep breath if you are having pain. AFTER USE  Rest and  breathe slowly and easily.  It can be helpful to keep track of a log of your progress. Your caregiver can provide you with a simple table to help with this. If you are using the spirometer at home, follow these instructions: [place] IF:   You are having difficultly using the spirometer.  You have trouble using the spirometer as often as instructed.  Your pain medication is not giving enough relief while using the spirometer.  You develop fever of 100.5 F (38.1 C) or higher. SEEK IMMEDIATE MEDICAL CARE IF:   You cough up bloody sputum that had not been present before.  You develop fever of 102 F (38.9 C) or greater.  You develop worsening pain at or near the incision site. MAKE SURE YOU:   Understand these instructions.  Will watch your condition.  Will get help right away if you are not doing well or get worse. Document Released: 12/25/2006 Document Revised: 11/06/2011 Document Reviewed: 02/25/2007 ExitCare Patient Information 2014 Exit[employer], [place].   ________________________________________________________________________  WHAT IS A BLOOD TRANSFUSION? Blood Transfusion Information  A transfusion is the replacement of blood or some of its parts. Blood is made up of multiple cells which provide different functions.  Red blood cells carry oxygen and are used for blood loss replacement.  White blood cells fight against infection.  Platelets control bleeding.  Plasma helps clot blood.  Other blood products are available for specialized needs, such as hemophilia or other clotting disorders. BEFORE THE TRANSFUSION  Who gives blood for transfusions?   Healthy volunteers who are fully evaluated to make sure their blood is safe. This is blood bank blood. Transfusion therapy is the safest it has ever been in the practice of medicine. Before blood is taken from a donor, a complete history is taken to make sure that person has no history of diseases nor engages in  risky social behavior (examples are intravenous drug use or sexual activity with multiple partners). The donor's travel history is screened to minimize risk of transmitting infections, such as malaria. The donated blood is tested for signs of infectious diseases, such as HIV and hepatitis. The blood is then tested to be sure it is compatible with you in order to minimize the chance of a transfusion reaction. If you or a relative donates blood, this is often done in anticipation of surgery and is not appropriate for emergency situations. It takes many days to process the donated blood. RISKS AND COMPLICATIONS Although transfusion therapy is very safe and saves many lives, the main dangers of transfusion include:   Getting an infectious disease.  Developing a transfusion reaction. This is an allergic reaction to something in the blood you were given. Every precaution is taken to prevent this. The decision to have a blood transfusion has been considered carefully by your caregiver before blood is given. Blood is not given unless the benefits outweigh the risks. AFTER THE TRANSFUSION  Right after receiving a blood transfusion, you will usually feel much better and more energetic. This is especially true if your red blood cells have gotten low (anemic). The transfusion raises the level of the red blood cells which carry oxygen, and this usually causes an energy increase.  The nurse administering the transfusion will  monitor you carefully for complications. HOME CARE INSTRUCTIONS  No special instructions are needed after a transfusion. You may find your energy is better. Speak with your caregiver about any limitations on activity for underlying diseases you may have. SEEK MEDICAL CARE IF:   Your condition is not improving after your transfusion.  You develop redness or irritation at the intravenous (IV) site. SEEK IMMEDIATE MEDICAL CARE IF:  Any of the following symptoms occur over the next 12  hours:  Shaking chills.  You have a temperature by mouth above 102 F (38.9 C), not controlled by medicine.  Chest, back, or muscle pain.  People around you feel you are not acting correctly or are confused.  Shortness of breath or difficulty breathing.  Dizziness and fainting.  You get a rash or develop hives.  You have a decrease in urine output.  Your urine turns a dark color or changes to pink, red, or brown. Any of the following symptoms occur over the next 10 days:  You have a temperature by mouth above 102 F (38.9 C), not controlled by medicine.  Shortness of breath.  Weakness after normal activity.  The white part of the eye turns yellow (jaundice).  You have a decrease in the amount of urine or are urinating less often.  Your urine turns a dark color or changes to pink, red, or brown. Document Released: 08/11/2000 Document Revised: 11/06/2011 Document Reviewed: 03/30/2008 [facility] Patient Information 2014 [place], [place].  _______________________________________________________________________

## 2015-03-30 ENCOUNTER — Encounter (HOSPITAL_COMMUNITY)
Admission: RE | Admit: 2015-03-30 | Discharge: 2015-03-30 | Disposition: A | Discharge status: Home / Self Care | Payer: Medicare Other | Source: Ambulatory Visit | Attending: Orthopedic Surgery | Admitting: Orthopedic Surgery

## 2015-03-30 ENCOUNTER — Telehealth: Payer: Self-pay | Admitting: *Deleted

## 2015-03-30 ENCOUNTER — Encounter (HOSPITAL_COMMUNITY): Payer: Self-pay

## 2015-03-30 ENCOUNTER — Ambulatory Visit (HOSPITAL_COMMUNITY)
Admission: RE | Admit: 2015-03-30 | Discharge: 2015-03-30 | Disposition: A | Discharge status: Home / Self Care | Payer: Medicare Other | Source: Ambulatory Visit | Attending: Surgical | Admitting: Surgical

## 2015-03-30 DIAGNOSIS — I517 Cardiomegaly: Secondary | ICD-10-CM | POA: Diagnosis not present

## 2015-03-30 DIAGNOSIS — Z01818 Encounter for other preprocedural examination: Secondary | ICD-10-CM | POA: Diagnosis not present

## 2015-03-30 DIAGNOSIS — Z01812 Encounter for preprocedural laboratory examination: Secondary | ICD-10-CM | POA: Insufficient documentation

## 2015-03-30 DIAGNOSIS — Z952 Presence of prosthetic heart valve: Secondary | ICD-10-CM | POA: Insufficient documentation

## 2015-03-30 HISTORY — DX: Reserved for inherently not codable concepts without codable children: IMO0001

## 2015-03-30 LAB — URINALYSIS, ROUTINE W REFLEX MICROSCOPIC
Bilirubin Urine: NEGATIVE
Glucose, UA: NEGATIVE mg/dL
Hgb urine dipstick: NEGATIVE
Ketones, ur: NEGATIVE mg/dL
Leukocytes, UA: NEGATIVE
Nitrite: NEGATIVE
Protein, ur: NEGATIVE mg/dL
Specific Gravity, Urine: 1.01 (ref 1.005–1.030)
Urobilinogen, UA: 0.2 mg/dL (ref 0.0–1.0)
pH: 6 (ref 5.0–8.0)

## 2015-03-30 LAB — COMPREHENSIVE METABOLIC PANEL
ALT: 17 U/L (ref 17–63)
AST: 23 U/L (ref 15–41)
Albumin: 4 g/dL (ref 3.5–5.0)
Alkaline Phosphatase: 66 U/L (ref 38–126)
Anion gap: 8 (ref 5–15)
BUN: 13 mg/dL (ref 6–20)
CO2: 29 mmol/L (ref 22–32)
Calcium: 9.2 mg/dL (ref 8.9–10.3)
Chloride: 102 mmol/L (ref 101–111)
Creatinine, Ser: 0.91 mg/dL (ref 0.61–1.24)
GFR calc Af Amer: >60 mL/min (ref >60)
GFR calc non Af Amer: >60 mL/min (ref >60)
Glucose, Bld: 79 mg/dL (ref 65–99)
Potassium: 3.7 mmol/L (ref 3.5–5.1)
Sodium: 139 mmol/L (ref 135–145)
Total Bilirubin: 0.9 mg/dL (ref 0.3–1.2)
Total Protein: 7.1 g/dL (ref 6.5–8.1)

## 2015-03-30 LAB — CBC WITH DIFFERENTIAL/PLATELET
Basophils Absolute: 0 10*3/uL (ref 0.0–0.1)
Basophils Relative: 1 % (ref 0–1)
Eosinophils Absolute: 0.2 10*3/uL (ref 0.0–0.7)
Eosinophils Relative: 4 % (ref 0–5)
HCT: 38.2 % — ABNORMAL LOW (ref 39.0–52.0)
Hemoglobin: 12.2 g/dL — ABNORMAL LOW (ref 13.0–17.0)
Lymphocytes Relative: 22 % (ref 12–46)
Lymphs Abs: 1.3 10*3/uL (ref 0.7–4.0)
MCH: 30.3 pg (ref 26.0–34.0)
MCHC: 31.9 g/dL (ref 30.0–36.0)
MCV: 95 fL (ref 78.0–100.0)
Monocytes Absolute: 0.8 10*3/uL (ref 0.1–1.0)
Monocytes Relative: 13 % — ABNORMAL HIGH (ref 3–12)
Neutro Abs: 3.6 10*3/uL (ref 1.7–7.7)
Neutrophils Relative %: 60 % (ref 43–77)
Platelets: 214 10*3/uL (ref 150–400)
RBC: 4.02 MIL/uL — ABNORMAL LOW (ref 4.22–5.81)
RDW: 13.7 % (ref 11.5–15.5)
WBC: 5.9 10*3/uL (ref 4.0–10.5)

## 2015-03-30 LAB — PROTIME-INR
INR: 1.76 — ABNORMAL HIGH (ref 0.00–1.49)
Prothrombin Time: 20.5 seconds — ABNORMAL HIGH (ref 11.6–15.2)

## 2015-03-30 LAB — SURGICAL PCR SCREEN
MRSA, PCR: NEGATIVE
Staphylococcus aureus: NEGATIVE

## 2015-03-30 LAB — APTT: aPTT: 35 seconds (ref 24–37)

## 2015-03-30 NOTE — Telephone Encounter (Signed)
Ok for knee surgery; hold coumadin 5 days prior to procedure; lovenox bridge. [name]

## 2015-03-30 NOTE — Progress Notes (Signed)
EKG 12/29/14 on EPIC, ECHO 12/29/14 on EPIC

## 2015-03-30 NOTE — Telephone Encounter (Signed)
Pt is needing clearance for left knee surgery, 04-14-15. Will forward for dr Stanford Breed review

## 2015-03-31 NOTE — Progress Notes (Addendum)
Received clearance per Dr Chrenshaw/cardiology per epic 03/30/2015. Patient verbalized understanding in regards to coumadin/lovenox instructions.

## 2015-04-01 NOTE — Telephone Encounter (Signed)
Will forward this message

## 2015-04-06 NOTE — H&P (Signed)
TOTAL KNEE ADMISSION H&P  Patient is being admitted for left total knee arthroplasty.  Subjective:  Chief Complaint:left knee pain.  HPI: [name], 79 y.o. male, has a history of pain and functional disability in the left knee due to arthritis and has failed non-surgical conservative treatments for greater than 12 weeks to includeNSAID's and/or analgesics, corticosteriod injections and activity modification.  Onset of symptoms was gradual, starting 5 years ago with gradually worsening course since that time. The patient noted prior procedures on the knee to include  menisectomy on the left knee(s).  Patient currently rates pain in the left knee(s) at 7 out of 10 with activity. Patient has night pain, worsening of pain with activity and weight bearing, pain that interferes with activities of daily living, pain with passive range of motion, crepitus and joint swelling.  Patient has evidence of periarticular osteophytes and joint space narrowing by imaging studies.There is no active infection.  Patient Active Problem List   Diagnosis Date Noted  . Spinal stenosis, lumbar region, with neurogenic claudication 01/15/2015  . Acute prostatitis 06/10/2014  . Encounter for therapeutic drug monitoring 10/13/2013  . Heart valve replaced by other means 07/03/2013  . Long term (current) use of anticoagulants 07/03/2013  . Anemia 06/16/2013  . Urticaria 11/27/2011  . Anxiety 04/04/2011  . Preop cardiovascular exam 12/19/2010  . CAROTID BRUIT 03/23/2009  . DEGENERATIVE JOINT DISEASE 03/22/2009  . FIBRILLATION, ATRIAL 03/16/2009  . Aortic valve disorder 01/21/2008  . 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 BENIGN POSITIONAL VERTIGO 05/16/2007  . Gout 04/17/2007  . Essential hypertension 04/17/2007  . GERD 04/17/2007   Past Medical History  Diagnosis Date  . HTN (hypertension)   . Atrial fibrillation   . Aortic stenosis   . Erectile dysfunction   . Gout   . MVP (mitral valve prolapse)    . Baker cyst     Left knee  . Eczema   . DJD (degenerative joint disease)     knees  . Shortness of breath dyspnea     with activity    Past Surgical History  Procedure Laterality Date  . Aortic valve replacement  ~2004  . Right total hip  ~4 years ago  .  polyps vocal cord  1994  . Rotator cuff repair Left ~2009  . Lumbar laminectomy/decompression microdiscectomy N/A 01/15/2015    Procedure: 2 LEVEL DECOMPRESSIVE LUMBAR LAMINECTOMY L3-L4,L4-L5;  Surgeon: Latanya Maudlin, MD;  Location: [facility] ORS;  Service: Orthopedics;  Laterality: N/A;  . Hernia repair  ~1978  . Hernia repair  [phone]      Current outpatient prescriptions:  .  allopurinol (ZYLOPRIM) 300 MG tablet, TAKE ONE TABLET BY MOUTH EVERY DAY, Disp: 100 tablet, Rfl: 3 .  amLODipine (NORVASC) 5 MG tablet, One tab daily (Patient taking differently: Take 5 mg by mouth daily. One tab daily), Disp: 90 tablet, Rfl: 3 .  furosemide (LASIX) 20 MG tablet, One tablet daily as needed for swelling, Disp: 30 tablet, Rfl: 6 .  naproxen sodium (ANAPROX) 220 MG tablet, Take 440 mg by mouth every morning., Disp: , Rfl:  .  tamsulosin (FLOMAX) 0.4 MG CAPS capsule, Take 0.4 mg by mouth daily. , Disp: , Rfl:  .  traMADol (ULTRAM) 50 MG tablet, Take 50 mg by mouth every 6 (six) hours as needed for severe pain., Disp: , Rfl:  .  traZODone (DESYREL) 50 MG tablet, TAKE 1 TAB AT BEDTIME AS NEEDED (TO REPLACE AMBIEN ), Disp: 90 tablet, Rfl: 3 .  warfarin (COUMADIN) 5 MG tablet, Take as directed by the coumadin clinic (Patient taking differently: Take 2.5-5 mg by mouth daily. '5mg'$  on Mon, wed, and fri. Take 2.5 mg on tues, thus, sat, and sun), Disp: 90 tablet, Rfl: 1  No Known Allergies  History  Substance Use Topics  . Smoking status: Former Smoker -- 30 years    Types: Cigarettes    Quit date: 08/28/2002  . Smokeless tobacco: Never Used  . Alcohol Use: Yes     Comment: social    Family History  Problem Relation Age of Onset  . Diabetes    .  Stroke       Review of Systems  Constitutional: Negative.   HENT: Negative.   Eyes: Negative.   Respiratory: Positive for shortness of breath. Negative for cough, hemoptysis, sputum production and wheezing.        SOB on exertion  Cardiovascular: Negative.   Gastrointestinal: Negative.   Genitourinary: Negative.   Musculoskeletal: Positive for back pain and joint pain. Negative for myalgias, falls and neck pain.       Left knee pain  Skin: Negative.   Neurological: Negative.   Endo/Heme/Allergies: Negative.   Psychiatric/Behavioral: Negative.     Objective:  Physical Exam  Constitutional: He is oriented to person, place, and time. He appears well-developed and well-nourished. No distress.  HENT:  Head: Normocephalic and atraumatic.  Right Ear: External ear normal.  Left Ear: External ear normal.  Nose: Nose normal.  Mouth/Throat: Oropharynx is clear and moist.  Eyes: Conjunctivae and EOM are normal.  Neck: Normal range of motion. Neck supple.  Cardiovascular:  No murmur heard. Respiratory: Effort normal and breath sounds normal. No respiratory distress. He has no wheezes.  GI: Soft. Bowel sounds are normal. He exhibits no distension. There is no tenderness.  Musculoskeletal:       Right hip: Normal.       Left hip: Normal.       Right knee: Normal.       Left knee: He exhibits decreased range of motion and swelling. He exhibits no effusion and no erythema. Tenderness found. Medial joint line and lateral joint line tenderness noted.  Neurological: He is alert and oriented to person, place, and time. He has normal strength and normal reflexes. No sensory deficit.  Skin: No rash noted. He is not diaphoretic. No erythema.  Psychiatric: He has a normal mood and affect. His behavior is normal.   Vitals  Weight: 170 lb Height: 68.5in Body Surface Area: 1.92 m Body Mass Index: 25.47 kg/m  Pulse: 84 (Regular)  BP: 122/80 (Sitting, Left Arm, Standard)   Imaging  Review Plain radiographs demonstrate severe degenerative joint disease of the left knee(s). The overall alignment issignificant varus. The bone quality appears to be good for age and reported activity level.  Assessment/Plan:  End stage primary osteoarthritis, left knee   The patient history, physical examination, clinical judgment of the provider and imaging studies are consistent with end stage degenerative joint disease of the left knee(s) and total knee arthroplasty is deemed medically necessary. The treatment options including medical management, injection therapy arthroscopy and arthroplasty were discussed at length. The risks and benefits of total knee arthroplasty were presented and reviewed. The risks due to aseptic loosening, infection, stiffness, patella tracking problems, thromboembolic complications and other imponderables were discussed. The patient acknowledged the explanation, agreed to proceed with the plan and consent was signed. Patient is being admitted for inpatient treatment for surgery, pain control, PT,  OT, prophylactic antibiotics, VTE prophylaxis, progressive ambulation and ADL's and discharge planning. The patient is planning to be discharged home with home health services   PCP: Stevie Kern Cardio: Stanford Breed  Will be bridged preop with Lovenox TXA IV   Ardeen Jourdain PA-C

## 2015-04-14 ENCOUNTER — Encounter (HOSPITAL_COMMUNITY): Payer: Self-pay | Admitting: *Deleted

## 2015-04-14 ENCOUNTER — Inpatient Hospital Stay (HOSPITAL_COMMUNITY)
Admission: AD | Admit: 2015-04-14 | Discharge: 2015-04-17 | DRG: 470 | Disposition: A | Discharge status: Home Health | Payer: Medicare Other | Source: Ambulatory Visit | Attending: Orthopedic Surgery | Admitting: Orthopedic Surgery

## 2015-04-14 ENCOUNTER — Inpatient Hospital Stay (HOSPITAL_COMMUNITY): Payer: Medicare Other | Admitting: Certified Registered Nurse Anesthetist

## 2015-04-14 ENCOUNTER — Encounter (HOSPITAL_COMMUNITY)
Admission: AD | Disposition: A | Discharge status: Home Health | Payer: Self-pay | Source: Ambulatory Visit | Attending: Orthopedic Surgery

## 2015-04-14 DIAGNOSIS — R339 Retention of urine, unspecified: Secondary | ICD-10-CM | POA: Diagnosis not present

## 2015-04-14 DIAGNOSIS — M1712 Unilateral primary osteoarthritis, left knee: Principal | ICD-10-CM | POA: Diagnosis present

## 2015-04-14 DIAGNOSIS — I10 Essential (primary) hypertension: Secondary | ICD-10-CM | POA: Diagnosis not present

## 2015-04-14 DIAGNOSIS — Z87891 Personal history of nicotine dependence: Secondary | ICD-10-CM

## 2015-04-14 DIAGNOSIS — Z96641 Presence of right artificial hip joint: Secondary | ICD-10-CM | POA: Diagnosis not present

## 2015-04-14 DIAGNOSIS — I4891 Unspecified atrial fibrillation: Secondary | ICD-10-CM | POA: Diagnosis present

## 2015-04-14 DIAGNOSIS — Z7901 Long term (current) use of anticoagulants: Secondary | ICD-10-CM | POA: Diagnosis not present

## 2015-04-14 DIAGNOSIS — N529 Male erectile dysfunction, unspecified: Secondary | ICD-10-CM | POA: Diagnosis not present

## 2015-04-14 DIAGNOSIS — F419 Anxiety disorder, unspecified: Secondary | ICD-10-CM | POA: Diagnosis present

## 2015-04-14 DIAGNOSIS — K219 Gastro-esophageal reflux disease without esophagitis: Secondary | ICD-10-CM | POA: Diagnosis present

## 2015-04-14 DIAGNOSIS — M4806 Spinal stenosis, lumbar region: Secondary | ICD-10-CM | POA: Diagnosis present

## 2015-04-14 DIAGNOSIS — Z952 Presence of prosthetic heart valve: Secondary | ICD-10-CM | POA: Diagnosis not present

## 2015-04-14 DIAGNOSIS — M109 Gout, unspecified: Secondary | ICD-10-CM | POA: Diagnosis present

## 2015-04-14 DIAGNOSIS — M179 Osteoarthritis of knee, unspecified: Secondary | ICD-10-CM | POA: Diagnosis not present

## 2015-04-14 DIAGNOSIS — Z96659 Presence of unspecified artificial knee joint: Secondary | ICD-10-CM

## 2015-04-14 DIAGNOSIS — Z79899 Other long term (current) drug therapy: Secondary | ICD-10-CM

## 2015-04-14 DIAGNOSIS — L309 Dermatitis, unspecified: Secondary | ICD-10-CM | POA: Diagnosis not present

## 2015-04-14 HISTORY — PX: TOTAL KNEE ARTHROPLASTY: SHX125

## 2015-04-14 LAB — PROTIME-INR
INR: 1.2 (ref 0.00–1.49)
Prothrombin Time: 15.3 seconds — ABNORMAL HIGH (ref 11.6–15.2)

## 2015-04-14 SURGERY — ARTHROPLASTY, KNEE, TOTAL
Anesthesia: General | Laterality: Left

## 2015-04-14 MED ORDER — ROCURONIUM BROMIDE 100 MG/10ML IV SOLN
INTRAVENOUS | Status: AC
  Filled 2015-04-14: qty 1

## 2015-04-14 MED ORDER — FENTANYL CITRATE (PF) 100 MCG/2ML IJ SOLN
INTRAMUSCULAR | Status: DC | PRN
  Administered 2015-04-14 (×3): 50 ug via INTRAVENOUS
  Administered 2015-04-14: 100 ug via INTRAVENOUS

## 2015-04-14 MED ORDER — DEXAMETHASONE SODIUM PHOSPHATE 10 MG/ML IJ SOLN
INTRAMUSCULAR | Status: DC | PRN
  Administered 2015-04-14: 10 mg via INTRAVENOUS

## 2015-04-14 MED ORDER — LIDOCAINE HCL (CARDIAC) 20 MG/ML IV SOLN
INTRAVENOUS | Status: DC | PRN
  Administered 2015-04-14: 100 mg via INTRAVENOUS

## 2015-04-14 MED ORDER — ONDANSETRON HCL 4 MG/2ML IJ SOLN
INTRAMUSCULAR | Status: DC | PRN
  Administered 2015-04-14: 4 mg via INTRAVENOUS

## 2015-04-14 MED ORDER — NEOSTIGMINE METHYLSULFATE 10 MG/10ML IV SOLN
INTRAVENOUS | Status: AC
Start: 2015-04-14 — End: 2015-04-14
  Filled 2015-04-14: qty 1

## 2015-04-14 MED ORDER — HYDROCODONE-ACETAMINOPHEN 5-325 MG PO TABS
1.0000 | ORAL_TABLET | ORAL | Status: DC | PRN
  Administered 2015-04-14: 1 via ORAL
  Administered 2015-04-14 – 2015-04-17 (×8): 2 via ORAL
  Filled 2015-04-14 (×8): qty 2
  Filled 2015-04-14: qty 1
  Filled 2015-04-14: qty 2

## 2015-04-14 MED ORDER — ACETAMINOPHEN 325 MG PO TABS: 650.0000 mg | ORAL_TABLET | Freq: Four times a day (QID) | ORAL | Status: DC | PRN

## 2015-04-14 MED ORDER — METHOCARBAMOL 500 MG PO TABS
500.0000 mg | ORAL_TABLET | Freq: Four times a day (QID) | ORAL | Status: DC | PRN
  Administered 2015-04-15: 500 mg via ORAL
  Filled 2015-04-14: qty 1

## 2015-04-14 MED ORDER — FENTANYL CITRATE (PF) 250 MCG/5ML IJ SOLN
INTRAMUSCULAR | Status: AC
  Filled 2015-04-14: qty 25

## 2015-04-14 MED ORDER — TRANEXAMIC ACID 1000 MG/10ML IV SOLN
1000.0000 mg | INTRAVENOUS | Status: AC
  Administered 2015-04-14: 1000 mg via INTRAVENOUS
  Filled 2015-04-14: qty 10

## 2015-04-14 MED ORDER — CEFAZOLIN SODIUM 1-5 GM-% IV SOLN
1.0000 g | Freq: Four times a day (QID) | INTRAVENOUS | Status: AC
  Administered 2015-04-14 (×2): 1 g via INTRAVENOUS
  Filled 2015-04-14 (×2): qty 50

## 2015-04-14 MED ORDER — ONDANSETRON HCL 4 MG/2ML IJ SOLN: 4.0000 mg | Freq: Four times a day (QID) | INTRAMUSCULAR | Status: DC | PRN

## 2015-04-14 MED ORDER — WARFARIN SODIUM 5 MG PO TABS
5.0000 mg | ORAL_TABLET | Freq: Once | ORAL | Status: AC
  Administered 2015-04-14: 5 mg via ORAL
  Filled 2015-04-14 (×2): qty 1

## 2015-04-14 MED ORDER — MIDAZOLAM HCL 5 MG/5ML IJ SOLN
INTRAMUSCULAR | Status: DC | PRN
  Administered 2015-04-14: 2 mg via INTRAVENOUS

## 2015-04-14 MED ORDER — HYDROMORPHONE HCL 1 MG/ML IJ SOLN
0.2500 mg | INTRAMUSCULAR | Status: DC | PRN
  Administered 2015-04-14 (×2): 0.5 mg via INTRAVENOUS

## 2015-04-14 MED ORDER — SODIUM CHLORIDE 0.9 % IJ SOLN
INTRAMUSCULAR | Status: DC | PRN
Start: 2015-04-14 — End: 2015-04-14
  Administered 2015-04-14: 20 mL

## 2015-04-14 MED ORDER — ONDANSETRON HCL 4 MG/2ML IJ SOLN
INTRAMUSCULAR | Status: AC
  Filled 2015-04-14: qty 2

## 2015-04-14 MED ORDER — PROPOFOL 10 MG/ML IV BOLUS
INTRAVENOUS | Status: DC | PRN
  Administered 2015-04-14: 160 mg via INTRAVENOUS
  Administered 2015-04-14: 40 mg via INTRAVENOUS

## 2015-04-14 MED ORDER — LACTATED RINGERS IV SOLN
INTRAVENOUS | Status: DC
  Administered 2015-04-14 (×3): via INTRAVENOUS

## 2015-04-14 MED ORDER — SODIUM CHLORIDE 0.9 % IJ SOLN
INTRAMUSCULAR | Status: AC
  Filled 2015-04-14: qty 20

## 2015-04-14 MED ORDER — AMLODIPINE BESYLATE 5 MG PO TABS
5.0000 mg | ORAL_TABLET | Freq: Every day | ORAL | Status: DC
  Administered 2015-04-15 – 2015-04-17 (×3): 5 mg via ORAL
  Filled 2015-04-14 (×3): qty 1

## 2015-04-14 MED ORDER — SODIUM CHLORIDE 0.9 % IR SOLN
Status: AC
  Filled 2015-04-14: qty 1

## 2015-04-14 MED ORDER — ROCURONIUM BROMIDE 100 MG/10ML IV SOLN
INTRAVENOUS | Status: DC | PRN
  Administered 2015-04-14: 40 mg via INTRAVENOUS

## 2015-04-14 MED ORDER — CEFAZOLIN SODIUM-DEXTROSE 2-3 GM-% IV SOLR
INTRAVENOUS | Status: AC
  Filled 2015-04-14: qty 50

## 2015-04-14 MED ORDER — GLYCOPYRROLATE 0.2 MG/ML IJ SOLN
INTRAMUSCULAR | Status: DC | PRN
  Administered 2015-04-14: 0.4 mg via INTRAVENOUS

## 2015-04-14 MED ORDER — WARFARIN - PHARMACIST DOSING INPATIENT: Freq: Every day | Status: DC

## 2015-04-14 MED ORDER — THROMBIN 5000 UNITS EX SOLR
CUTANEOUS | Status: AC
  Filled 2015-04-14: qty 5000

## 2015-04-14 MED ORDER — ONDANSETRON HCL 4 MG PO TABS: 4.0000 mg | ORAL_TABLET | Freq: Four times a day (QID) | ORAL | Status: DC | PRN

## 2015-04-14 MED ORDER — ACETAMINOPHEN 650 MG RE SUPP: 650.0000 mg | Freq: Four times a day (QID) | RECTAL | Status: DC | PRN

## 2015-04-14 MED ORDER — HYDROMORPHONE HCL 2 MG/ML IJ SOLN
INTRAMUSCULAR | Status: AC
  Filled 2015-04-14: qty 1

## 2015-04-14 MED ORDER — HYDROMORPHONE HCL 1 MG/ML IJ SOLN
INTRAMUSCULAR | Status: AC
  Filled 2015-04-14: qty 1

## 2015-04-14 MED ORDER — PHENYLEPHRINE HCL 10 MG/ML IJ SOLN
INTRAMUSCULAR | Status: DC | PRN
  Administered 2015-04-14: 40 ug via INTRAVENOUS
  Administered 2015-04-14: 80 ug via INTRAVENOUS

## 2015-04-14 MED ORDER — SUCCINYLCHOLINE CHLORIDE 20 MG/ML IJ SOLN
INTRAMUSCULAR | Status: DC | PRN
  Administered 2015-04-14: 100 mg via INTRAVENOUS

## 2015-04-14 MED ORDER — PHENOL 1.4 % MT LIQD: 1.0000 | OROMUCOSAL | Status: DC | PRN

## 2015-04-14 MED ORDER — HYDROMORPHONE HCL 1 MG/ML IJ SOLN
INTRAMUSCULAR | Status: DC | PRN
  Administered 2015-04-14 (×2): 0.5 mg via INTRAVENOUS

## 2015-04-14 MED ORDER — LACTATED RINGERS IV SOLN
INTRAVENOUS | Status: DC
  Administered 2015-04-14: 17:00:00 via INTRAVENOUS

## 2015-04-14 MED ORDER — ALUM & MAG HYDROXIDE-SIMETH 200-200-20 MG/5ML PO SUSP: 30.0000 mL | ORAL | Status: DC | PRN

## 2015-04-14 MED ORDER — ALLOPURINOL 300 MG PO TABS
300.0000 mg | ORAL_TABLET | Freq: Every day | ORAL | Status: DC
  Administered 2015-04-15 – 2015-04-17 (×3): 300 mg via ORAL
  Filled 2015-04-14 (×3): qty 1

## 2015-04-14 MED ORDER — NEOSTIGMINE METHYLSULFATE 10 MG/10ML IV SOLN
INTRAVENOUS | Status: DC | PRN
  Administered 2015-04-14: 3 mg via INTRAVENOUS

## 2015-04-14 MED ORDER — BUPIVACAINE HCL (PF) 0.25 % IJ SOLN
INTRAMUSCULAR | Status: DC | PRN
  Administered 2015-04-14: 20 mL

## 2015-04-14 MED ORDER — CELECOXIB 200 MG PO CAPS
200.0000 mg | ORAL_CAPSULE | Freq: Two times a day (BID) | ORAL | Status: DC
  Administered 2015-04-14 – 2015-04-17 (×6): 200 mg via ORAL
  Filled 2015-04-14 (×9): qty 1

## 2015-04-14 MED ORDER — MENTHOL 3 MG MT LOZG
1.0000 | LOZENGE | OROMUCOSAL | Status: DC | PRN
  Administered 2015-04-14: 3 mg via ORAL
  Filled 2015-04-14: qty 9

## 2015-04-14 MED ORDER — CEFAZOLIN SODIUM-DEXTROSE 2-3 GM-% IV SOLR
2.0000 g | INTRAVENOUS | Status: AC
  Administered 2015-04-14: 2 g via INTRAVENOUS

## 2015-04-14 MED ORDER — LIDOCAINE HCL (CARDIAC) 20 MG/ML IV SOLN
INTRAVENOUS | Status: AC
  Filled 2015-04-14: qty 5

## 2015-04-14 MED ORDER — MIDAZOLAM HCL 2 MG/2ML IJ SOLN
INTRAMUSCULAR | Status: AC
  Filled 2015-04-14: qty 4

## 2015-04-14 MED ORDER — THROMBIN 5000 UNITS EX SOLR
OROMUCOSAL | Status: DC | PRN
  Administered 2015-04-14: 5 mL via TOPICAL

## 2015-04-14 MED ORDER — OXYCODONE-ACETAMINOPHEN 5-325 MG PO TABS
2.0000 | ORAL_TABLET | ORAL | Status: DC | PRN
Start: 2015-04-14 — End: 2015-04-17

## 2015-04-14 MED ORDER — BUPIVACAINE HCL (PF) 0.25 % IJ SOLN
INTRAMUSCULAR | Status: AC
  Filled 2015-04-14: qty 30

## 2015-04-14 MED ORDER — BUPIVACAINE LIPOSOME 1.3 % IJ SUSP
20.0000 mL | Freq: Once | INTRAMUSCULAR | Status: AC
  Administered 2015-04-14: 20 mL
  Filled 2015-04-14: qty 20

## 2015-04-14 MED ORDER — SODIUM CHLORIDE 0.9 % IR SOLN
Status: DC | PRN
  Administered 2015-04-14: 500 mL

## 2015-04-14 MED ORDER — FLEET ENEMA 7-19 GM/118ML RE ENEM: 1.0000 | ENEMA | Freq: Once | RECTAL | Status: DC | PRN

## 2015-04-14 MED ORDER — METHOCARBAMOL 1000 MG/10ML IJ SOLN
500.0000 mg | Freq: Four times a day (QID) | INTRAVENOUS | Status: DC | PRN
  Administered 2015-04-14: 500 mg via INTRAVENOUS
  Filled 2015-04-14 (×2): qty 5

## 2015-04-14 MED ORDER — POLYETHYLENE GLYCOL 3350 17 G PO PACK
17.0000 g | PACK | Freq: Every day | ORAL | Status: DC | PRN
Start: 2015-04-14 — End: 2015-04-17

## 2015-04-14 MED ORDER — TRAZODONE HCL 50 MG PO TABS
50.0000 mg | ORAL_TABLET | Freq: Every day | ORAL | Status: DC
  Administered 2015-04-14: 50 mg via ORAL
  Filled 2015-04-14 (×4): qty 1

## 2015-04-14 MED ORDER — GLYCOPYRROLATE 0.2 MG/ML IJ SOLN
INTRAMUSCULAR | Status: AC
Start: 2015-04-14 — End: 2015-04-14
  Filled 2015-04-14: qty 3

## 2015-04-14 MED ORDER — FUROSEMIDE 20 MG PO TABS
20.0000 mg | ORAL_TABLET | Freq: Every day | ORAL | Status: DC
  Administered 2015-04-14 – 2015-04-17 (×4): 20 mg via ORAL
  Filled 2015-04-14 (×4): qty 1

## 2015-04-14 MED ORDER — BISACODYL 5 MG PO TBEC: 5.0000 mg | DELAYED_RELEASE_TABLET | Freq: Every day | ORAL | Status: DC | PRN

## 2015-04-14 MED ORDER — PROPOFOL 10 MG/ML IV BOLUS
INTRAVENOUS | Status: AC
  Filled 2015-04-14: qty 20

## 2015-04-14 MED ORDER — PROMETHAZINE HCL 25 MG/ML IJ SOLN: 6.2500 mg | INTRAMUSCULAR | Status: DC | PRN

## 2015-04-14 MED ORDER — HYDROMORPHONE HCL 1 MG/ML IJ SOLN
1.0000 mg | INTRAMUSCULAR | Status: DC | PRN
  Administered 2015-04-14: 1 mg via INTRAVENOUS
  Filled 2015-04-14: qty 1

## 2015-04-14 MED ORDER — FERROUS SULFATE 325 (65 FE) MG PO TABS
325.0000 mg | ORAL_TABLET | Freq: Three times a day (TID) | ORAL | Status: DC
  Administered 2015-04-14 – 2015-04-17 (×8): 325 mg via ORAL
  Filled 2015-04-14 (×11): qty 1

## 2015-04-14 MED ORDER — TAMSULOSIN HCL 0.4 MG PO CAPS
0.4000 mg | ORAL_CAPSULE | Freq: Every day | ORAL | Status: DC
  Administered 2015-04-14 – 2015-04-17 (×4): 0.4 mg via ORAL
  Filled 2015-04-14 (×4): qty 1

## 2015-04-14 SURGICAL SUPPLY — 76 items
ANCHOR PEEK ZIP 5.5 NDL NO2 (Orthopedic Implant) ×3 IMPLANT
BAG DECANTER FOR FLEXI CONT (MISCELLANEOUS) IMPLANT
BAG ZIPLOCK 12X15 (MISCELLANEOUS) IMPLANT
BANDAGE ELASTIC 4 VELCRO ST LF (GAUZE/BANDAGES/DRESSINGS) ×3 IMPLANT
BANDAGE ELASTIC 6 VELCRO ST LF (GAUZE/BANDAGES/DRESSINGS) ×3 IMPLANT
BANDAGE ESMARK 6X9 LF (GAUZE/BANDAGES/DRESSINGS) ×1 IMPLANT
BLADE SAG 18X100X1.27 (BLADE) ×3 IMPLANT
BLADE SAW SGTL 11.0X1.19X90.0M (BLADE) ×3 IMPLANT
BNDG ESMARK 6X9 LF (GAUZE/BANDAGES/DRESSINGS) ×3
BONE CEMENT GENTAMICIN (Cement) ×6 IMPLANT
CAP KNEE TOTAL 3 SIGMA ×3 IMPLANT
CEMENT BONE GENTAMICIN 40 (Cement) ×2 IMPLANT
CUFF TOURN SGL QUICK 34 (TOURNIQUET CUFF) ×2
CUFF TRNQT CYL 34X4X40X1 (TOURNIQUET CUFF) ×1 IMPLANT
DECANTER SPIKE VIAL GLASS SM (MISCELLANEOUS) ×3 IMPLANT
DRAPE EXTREMITY T 121X128X90 (DRAPE) ×3 IMPLANT
DRAPE INCISE IOBAN 66X45 STRL (DRAPES) IMPLANT
DRAPE POUCH INSTRU U-SHP 10X18 (DRAPES) ×3 IMPLANT
DRAPE SHEET LG 3/4 BI-LAMINATE (DRAPES) ×3 IMPLANT
DRAPE U-SHAPE 47X51 STRL (DRAPES) ×3 IMPLANT
DRSG AQUACEL AG ADV 3.5X10 (GAUZE/BANDAGES/DRESSINGS) ×3 IMPLANT
DRSG PAD ABDOMINAL 8X10 ST (GAUZE/BANDAGES/DRESSINGS) ×3 IMPLANT
DRSG TEGADERM 4X4.75 (GAUZE/BANDAGES/DRESSINGS) ×3 IMPLANT
DURAPREP 26ML APPLICATOR (WOUND CARE) ×3 IMPLANT
ELECT REM PT RETURN 9FT ADLT (ELECTROSURGICAL) ×3
ELECTRODE REM PT RTRN 9FT ADLT (ELECTROSURGICAL) ×1 IMPLANT
EVACUATOR 1/8 PVC DRAIN (DRAIN) ×3 IMPLANT
FACESHIELD WRAPAROUND (MASK) ×15 IMPLANT
GAUZE SPONGE 2X2 8PLY STRL LF (GAUZE/BANDAGES/DRESSINGS) ×1 IMPLANT
GLOVE BIOGEL PI IND STRL 6.5 (GLOVE) ×1 IMPLANT
GLOVE BIOGEL PI IND STRL 8 (GLOVE) ×1 IMPLANT
GLOVE BIOGEL PI INDICATOR 6.5 (GLOVE) ×2
GLOVE BIOGEL PI INDICATOR 8 (GLOVE) ×2
GLOVE ECLIPSE 8.0 STRL XLNG CF (GLOVE) ×6 IMPLANT
GLOVE SURG SS PI 6.5 STRL IVOR (GLOVE) ×3 IMPLANT
GOWN STRL REUS W/TWL LRG LVL3 (GOWN DISPOSABLE) ×3 IMPLANT
GOWN STRL REUS W/TWL XL LVL3 (GOWN DISPOSABLE) ×3 IMPLANT
HANDPIECE INTERPULSE COAX TIP (DISPOSABLE) ×2
IMMOBILIZER KNEE 20 (SOFTGOODS) ×6 IMPLANT
IMMOBILIZER KNEE 20 THIGH 36 (SOFTGOODS) ×1 IMPLANT
IMMOBILIZER KNEE 22 (SOFTGOODS) ×3 IMPLANT
KIT BASIN OR (CUSTOM PROCEDURE TRAY) ×3 IMPLANT
LIQUID BAND (GAUZE/BANDAGES/DRESSINGS) ×3 IMPLANT
MANIFOLD NEPTUNE II (INSTRUMENTS) ×3 IMPLANT
NDL SAFETY ECLIPSE 18X1.5 (NEEDLE) ×2 IMPLANT
NEEDLE HYPO 18GX1.5 SHARP (NEEDLE) ×4
NEEDLE HYPO 22GX1.5 SAFETY (NEEDLE) ×3 IMPLANT
NS IRRIG 1000ML POUR BTL (IV SOLUTION) IMPLANT
PACK TOTAL JOINT (CUSTOM PROCEDURE TRAY) ×3 IMPLANT
PADDING CAST COTTON 6X4 STRL (CAST SUPPLIES) ×6 IMPLANT
PEN SKIN MARKING BROAD (MISCELLANEOUS) ×3 IMPLANT
POSITIONER SURGICAL ARM (MISCELLANEOUS) IMPLANT
SET HNDPC FAN SPRY TIP SCT (DISPOSABLE) ×1 IMPLANT
SET PAD KNEE POSITIONER (MISCELLANEOUS) ×3 IMPLANT
SPONGE GAUZE 2X2 STER 10/PKG (GAUZE/BANDAGES/DRESSINGS) ×2
SPONGE LAP 18X18 X RAY DECT (DISPOSABLE) ×3 IMPLANT
SPONGE SURGIFOAM ABS GEL 100 (HEMOSTASIS) ×3 IMPLANT
STAPLER VISISTAT 35W (STAPLE) IMPLANT
SUCTION FRAZIER 12FR DISP (SUCTIONS) ×3 IMPLANT
SUT BONE WAX W31G (SUTURE) ×3 IMPLANT
SUT MNCRL AB 4-0 PS2 18 (SUTURE) ×3 IMPLANT
SUT VIC AB 1 CT1 27 (SUTURE) ×4
SUT VIC AB 1 CT1 27XBRD ANTBC (SUTURE) ×2 IMPLANT
SUT VIC AB 2-0 CT1 27 (SUTURE) ×6
SUT VIC AB 2-0 CT1 TAPERPNT 27 (SUTURE) ×3 IMPLANT
SUT VLOC 180 0 24IN GS25 (SUTURE) ×3 IMPLANT
SYR 20CC LL (SYRINGE) ×6 IMPLANT
SYR 50ML LL SCALE MARK (SYRINGE) ×3 IMPLANT
TOWEL OR 17X26 10 PK STRL BLUE (TOWEL DISPOSABLE) ×3 IMPLANT
TOWEL OR NON WOVEN STRL DISP B (DISPOSABLE) IMPLANT
TOWER CARTRIDGE SMART MIX (DISPOSABLE) ×3 IMPLANT
TRAY FOLEY W/METER SILVER 14FR (SET/KITS/TRAYS/PACK) IMPLANT
TRAY FOLEY W/METER SILVER 16FR (SET/KITS/TRAYS/PACK) ×3 IMPLANT
WATER STERILE IRR 1500ML POUR (IV SOLUTION) ×3 IMPLANT
WRAP KNEE MAXI GEL POST OP (GAUZE/BANDAGES/DRESSINGS) ×3 IMPLANT
YANKAUER SUCT BULB TIP 10FT TU (MISCELLANEOUS) ×3 IMPLANT

## 2015-04-14 NOTE — Anesthesia Preprocedure Evaluation (Addendum)
Anesthesia Evaluation  Patient identified by MRN, date of birth, ID band Patient awake    Reviewed: Allergy & Precautions, NPO status , Patient's Chart, lab work & pertinent test results  Airway Mallampati: II  TM Distance: >3 FB Neck ROM: Full    Dental no notable dental hx.    Pulmonary neg pulmonary ROS, former smoker,  breath sounds clear to auscultation  Pulmonary exam normal       Cardiovascular hypertension, + dysrhythmias Atrial Fibrillation Rhythm:Regular Rate:Normal + Systolic murmurs    Neuro/Psych negative neurological ROS  negative psych ROS   GI/Hepatic negative GI ROS, Neg liver ROS,   Endo/Other  negative endocrine ROS  Renal/GU negative Renal ROS  negative genitourinary   Musculoskeletal negative musculoskeletal ROS (+)   Abdominal   Peds negative pediatric ROS (+)  Hematology   Anesthesia Other Findings   Reproductive/Obstetrics negative OB ROS                            Anesthesia Physical Anesthesia Plan  ASA: II  Anesthesia Plan: General   Post-op Pain Management:    Induction: Intravenous  Airway Management Planned: LMA and Oral ETT  Additional Equipment:   Intra-op Plan:   Post-operative Plan: Extubation in OR  Informed Consent: I have reviewed the patients History and Physical, chart, labs and discussed the procedure including the risks, benefits and alternatives for the proposed anesthesia with the patient or authorized representative who has indicated his/her understanding and acceptance.   Dental advisory given  Plan Discussed with: CRNA and Surgeon  Anesthesia Plan Comments:         Anesthesia Quick Evaluation

## 2015-04-14 NOTE — Progress Notes (Signed)
ANTICOAGULATION CONSULT NOTE - Initial Consult  Pharmacy Consult for warfarin Indication: atrial fibrillation  No Known Allergies  Patient Measurements: Height: 5' 8.5" (174 cm) Weight: 172 lb (78.019 kg) IBW/kg (Calculated) : 69.55  Vital Signs: Temp: 98.5 F (36.9 C) (08/17 1301) Temp Source: Oral (08/17 0627) BP: 155/69 mmHg (08/17 1301) Pulse Rate: 100 (08/17 1301)  Labs:  Recent Labs  04/14/15 0715  LABPROT 15.3*  INR 1.20    Estimated Creatinine Clearance: 66.9 mL/min (by C-G formula based on Cr of 0.91).   Medical History: Past Medical History  Diagnosis Date  . HTN (hypertension)   . Atrial fibrillation   . Aortic stenosis   . Erectile dysfunction   . Gout   . MVP (mitral valve prolapse)   . Baker cyst     Left knee  . Eczema   . DJD (degenerative joint disease)     knees  . Shortness of breath dyspnea     with activity    Medications:  Prescriptions prior to admission  Medication Sig Dispense Refill Last Dose  . allopurinol (ZYLOPRIM) 300 MG tablet TAKE ONE TABLET BY MOUTH EVERY DAY 100 tablet 3 04/14/2015 at 0430  . amLODipine (NORVASC) 5 MG tablet One tab daily (Patient taking differently: Take 5 mg by mouth daily. One tab daily) 90 tablet 3 04/14/2015 at 0430  . enoxaparin (LOVENOX) 120 MG/0.8ML injection Inject 0.77 mLs (115 mg total) into the skin daily. 3 Syringe 1 04/13/2015 at 1100  . furosemide (LASIX) 20 MG tablet One tablet daily as needed for swelling 30 tablet 6 04/13/2015 at 0700  . naproxen sodium (ANAPROX) 220 MG tablet Take 440 mg by mouth every morning.   Past Week at Unknown time  . tamsulosin (FLOMAX) 0.4 MG CAPS capsule Take 0.4 mg by mouth daily.    04/13/2015 at 0700  . traMADol (ULTRAM) 50 MG tablet Take 50 mg by mouth every 6 (six) hours as needed for severe pain.   04/13/2015 at [place]  . traZODone (DESYREL) 50 MG tablet TAKE 1 TAB AT BEDTIME AS NEEDED (TO REPLACE AMBIEN ) 90 tablet 3 04/13/2015 at 2030  . warfarin (COUMADIN) 5 MG  tablet Take as directed by the coumadin clinic (Patient taking differently: Take 2.5-5 mg by mouth daily. '5mg'$  on Mon, wed, and fri. Take 2.5 mg on tues, thus, sat, and sun) 90 tablet 1 04/09/2015 at 0700  . oxyCODONE-acetaminophen (PERCOCET/ROXICET) 5-325 MG per tablet Take 1-2 tablets by mouth every 4 (four) hours as needed for moderate pain. (Patient not taking: Reported on 03/29/2015) 60 tablet 0   . predniSONE (DELTASONE) 20 MG tablet TAKE ONE-HALF TABLET BY MOUTH EVERY OTHER DAY (Patient not taking: Reported on 03/29/2015) 50 tablet 0    Scheduled:  . [START ON 04/15/2015] allopurinol  300 mg Oral Daily  . [START ON 04/15/2015] amLODipine  5 mg Oral Daily  .  ceFAZolin (ANCEF) IV  1 g Intravenous Q6H  . celecoxib  200 mg Oral Q12H  . ferrous sulfate  325 mg Oral TID PC  . furosemide  20 mg Oral Daily  . tamsulosin  0.4 mg Oral Daily  . traZODone  50 mg Oral QHS    Assessment: 79yoM admitted for TKA; on warfarin PTA for Afib.  Now to resume warfarin post-procedure.   Baseline INR wnl after holding since 8/12  Prior anticoagulation: warfarin 2.5 mg daily except 5 mg MWF; bridged to procedure w/ Lovenox 1.5 mg/kg daily)  Significant events:  Today, 04/14/2015:  CBC: wnl (from 8/2)  INR subtherapeutic  Major drug interactions: Aleve PTA  No bleeding issues per nursing  Diet: clear liquid  Goal of Therapy: INR 2-3  Plan:  Warfarin 5 mg PO tonight at 18:00  Daily INR  CBC at least q72 hr while on warfarin  Monitor for signs of bleeding or thrombosis   Reuel Boom, PharmD Pager: [phone] 04/14/2015, 1:47 PM

## 2015-04-14 NOTE — Interval H&P Note (Signed)
History and Physical Interval Note:  04/14/2015 11:01 AM  [name]  has presented today for surgery, with the diagnosis of OA LEFT KEE  The various methods of treatment have been discussed with the patient and family. After consideration of risks, benefits and other options for treatment, the patient has consented to  Procedure(s): LEFT TOTAL KNEE ARTHROPLASTY (Left) as a surgical intervention .  The patient's history has been reviewed, patient examined, no change in status, stable for surgery.  I have reviewed the patient's chart and labs.  Questions were answered to the patient's satisfaction.     [name] A

## 2015-04-14 NOTE — Evaluation (Signed)
Physical Therapy Evaluation Patient Details Name: [name] MRN: [id number] DOB: 12/28/1935 Today's Date: 04/14/2015   History of Present Illness  L TKA  Clinical Impression  Patient  Ambulated x 50'. Patient will benefit from PT to address problems listed in note below.    Follow Up Recommendations Home health PT;Supervision/Assistance - 24 hour    Equipment Recommendations  None recommended by PT    Recommendations for Other Services       Precautions / Restrictions Precautions Precautions: Knee Required Braces or Orthoses: Knee Immobilizer - Left Knee Immobilizer - Left: Discontinue once straight leg raise with < 10 degree lag      Mobility  Bed Mobility Overal bed mobility: Needs Assistance Bed Mobility: Supine to Sit     Supine to sit: Min assist     General bed mobility comments: cues for technique  Transfers Overall transfer level: Needs assistance Equipment used: Rolling walker (2 wheeled) Transfers: Sit to/from Stand Sit to Stand: Min assist         General transfer comment: cues for hand palcement  Ambulation/Gait Ambulation/Gait assistance: Min assist Ambulation Distance (Feet): 50 Feet Assistive device: Rolling walker (2 wheeled) Gait Pattern/deviations: Step-to pattern;Trunk flexed;Decreased step length - left;Decreased stance time - left     General Gait Details: cues for sequence  Stairs            Wheelchair Mobility    Modified Rankin (Stroke Patients Only)       Balance                                             Pertinent Vitals/Pain Pain Assessment: 0-10 Pain Score: 7  Pain Location: walking Pain Descriptors / Indicators: Aching;Discomfort Pain Intervention(s): Patient requesting pain meds-RN notified;Ice applied    Home Living Family/patient expects to be discharged to:: Private residence Living Arrangements: Children;Spouse/significant other Available Help at Discharge:  Friend(s);Family Type of Home: House Home Access: Stairs to enter Entrance Stairs-Rails: Left Entrance Stairs-Number of Steps: 5 Home Layout: Two level;Able to live on main level with bedroom/bathroom Home Equipment: Gilford Rile - 2 wheels;Cane - single point;Crutches;Grab bars - tub/shower;Shower seat - built in      Prior Function Level of Independence: Independent               Hand Dominance        Extremity/Trunk Assessment               Lower Extremity Assessment: LLE deficits/detail   LLE Deficits / Details: + SLR     Communication   Communication: No difficulties (voice is hoarse)  Cognition Arousal/Alertness: Awake/alert Behavior During Therapy: WFL for tasks assessed/performed Overall Cognitive Status: Within Functional Limits for tasks assessed                      General Comments      Exercises        Assessment/Plan    PT Assessment Patient needs continued PT services  PT Diagnosis Difficulty walking;Acute pain   PT Problem List Decreased strength;Decreased range of motion;Decreased activity tolerance;Decreased mobility;Decreased knowledge of precautions;Decreased safety awareness;Pain  PT Treatment Interventions DME instruction;Gait training;Stair training;Functional mobility training;Therapeutic activities;Patient/family education   PT Goals (Current goals can be found in the Care Plan section) Acute Rehab PT Goals Patient Stated Goal: to walk without pain PT Goal Formulation: With patient/family  Time For Goal Achievement: 04/21/15 Potential to Achieve Goals: Good    Frequency 7X/week   Barriers to discharge        Co-evaluation               End of Session Equipment Utilized During Treatment: Gait belt;Left knee immobilizer Activity Tolerance: Patient tolerated treatment well Patient left: in chair;with call bell/phone within reach;with family/visitor present Nurse Communication: Mobility status;Patient requests  pain meds         Time: 8677-3736 PT Time Calculation (min) (ACUTE ONLY): 21 min   Charges:   PT Evaluation $Initial PT Evaluation Tier I: 1 Procedure     PT G CodesClaretha Cooper 04/14/2015, 5:12 PM

## 2015-04-14 NOTE — Brief Op Note (Signed)
04/14/2015  10:40 AM  PATIENT:  [name]  79 y.o. male  PRE-OPERATIVE DIAGNOSIS:Primary  OA LEFT KEE  POST-OPERATIVE DIAGNOSIS:PrimaryOA LEFT KEE  PROCEDURE:  Procedure(s): LEFT TOTAL KNEE ARTHROPLASTY (Left)  SURGEON:  Surgeon(s) and Role:    * Latanya Maudlin, MD - Primary  PHYSICIAN ASSISTANT: Ardeen Jourdain PA  ASSISTANTS: Ardeen Jourdain PA  ANESTHESIA:   general  EBL:  Total I/O In: 1000 [I.V.:1000] Out: 300 [Urine:300]  BLOOD ADMINISTERED:none  DRAINS: (one) Hemovact drain(s) in the Left Knee with  Suction Open   LOCAL MEDICATIONS USED:  MARCAINE 20cc of 0.25% Plain and 20cc of Exparel mixed with 20cc of Normal Saline.    SPECIMEN:  No Specimen  DISPOSITION OF SPECIMEN:  N/A  COUNTS:  YES  TOURNIQUET:  * Missing tourniquet times found for documented tourniquets in log:  [id number] *  DICTATION: .Other Dictation: Dictation Number [id number]  PLAN OF CARE: Admit to inpatient   PATIENT DISPOSITION:  Stable in OR   Delay start of Pharmacological VTE agent (>24hrs) due to surgical blood loss or risk of bleeding: yes

## 2015-04-14 NOTE — Anesthesia Postprocedure Evaluation (Signed)
  Anesthesia Post-op Note  Patient: [name]  Procedure(s) Performed: Procedure(s) (LRB): LEFT TOTAL KNEE ARTHROPLASTY (Left)  Patient Location: PACU  Anesthesia Type: General  Level of Consciousness: awake and alert   Airway and Oxygen Therapy: Patient Spontanous Breathing  Post-op Pain: mild  Post-op Assessment: Post-op Vital signs reviewed, Patient's Cardiovascular Status Stable, Respiratory Function Stable, Patent Airway and No signs of Nausea or vomiting  Last Vitals:  Filed Vitals:   04/14/15 1800  BP: 146/75  Pulse: 105  Temp: 36.6 C  Resp: 18    Post-op Vital Signs: stable   Complications: No apparent anesthesia complications

## 2015-04-14 NOTE — Anesthesia Procedure Notes (Signed)
Procedure Name: Intubation Date/Time: 04/14/2015 8:49 AM Performed by: Montel Clock Pre-anesthesia Checklist: Patient identified, Emergency Drugs available, Suction available, Patient being monitored and Timeout performed Patient Re-evaluated:Patient Re-evaluated prior to inductionOxygen Delivery Method: Circle system utilized Preoxygenation: Pre-oxygenation with 100% oxygen Intubation Type: IV induction Ventilation: Mask ventilation without difficulty, Two handed mask ventilation required and Oral airway inserted - appropriate to patient size Laryngoscope Size: Mac and 3 Grade View: Grade I Tube type: Oral Tube size: 7.5 mm Number of attempts: 1 Airway Equipment and Method: Stylet Placement Confirmation: ETT inserted through vocal cords under direct vision,  positive ETCO2 and breath sounds checked- equal and bilateral Secured at: 23 cm Tube secured with: Tape Dental Injury: Teeth and Oropharynx as per pre-operative assessment  Comments: Easy mask with 2nd hand to hold cheek to mask for better seal. Prior to intubation attempted LMA 4 supreme and LMA 5 unique (r/t patient history vocal hoarseness), unable to achieve adequate seating/seal. Intubated without difficulty.

## 2015-04-14 NOTE — Progress Notes (Signed)
Utilization review completed.  

## 2015-04-14 NOTE — Transfer of Care (Signed)
Immediate Anesthesia Transfer of Care Note  Patient: [name] Setting  Procedure(s) Performed: Procedure(s): LEFT TOTAL KNEE ARTHROPLASTY (Left)  Patient Location: PACU  Anesthesia Type:General  Level of Consciousness:  sedated, patient cooperative and responds to stimulation  Airway & Oxygen Therapy:Patient Spontanous Breathing and Patient connected to face mask oxgen  Post-op Assessment:  Report given to PACU RN and Post -op Vital signs reviewed and stable  Post vital signs:  Reviewed and stable  Last Vitals:  Filed Vitals:   04/14/15 0627  BP: 159/66  Pulse: 99  Temp: 36.7 C  Resp: 18    Complications: No apparent anesthesia complications

## 2015-04-15 ENCOUNTER — Encounter (HOSPITAL_COMMUNITY): Payer: Self-pay | Admitting: Orthopedic Surgery

## 2015-04-15 LAB — CBC
HCT: 27.2 % — ABNORMAL LOW (ref 39.0–52.0)
Hemoglobin: 8.9 g/dL — ABNORMAL LOW (ref 13.0–17.0)
MCH: 30.4 pg (ref 26.0–34.0)
MCHC: 32.7 g/dL (ref 30.0–36.0)
MCV: 92.8 fL (ref 78.0–100.0)
Platelets: 132 10*3/uL — ABNORMAL LOW (ref 150–400)
RBC: 2.93 MIL/uL — ABNORMAL LOW (ref 4.22–5.81)
RDW: 13.9 % (ref 11.5–15.5)
WBC: 9.9 10*3/uL (ref 4.0–10.5)

## 2015-04-15 LAB — PROTIME-INR
INR: 1.28 (ref 0.00–1.49)
Prothrombin Time: 16.2 seconds — ABNORMAL HIGH (ref 11.6–15.2)

## 2015-04-15 LAB — CBC WITH DIFFERENTIAL/PLATELET
Basophils Absolute: 0 10*3/uL (ref 0.0–0.1)
Basophils Relative: 0 % (ref 0–1)
Eosinophils Absolute: 0.2 10*3/uL (ref 0.0–0.7)
Eosinophils Relative: 2 % (ref 0–5)
HCT: 28.5 % — ABNORMAL LOW (ref 39.0–52.0)
Hemoglobin: 9.2 g/dL — ABNORMAL LOW (ref 13.0–17.0)
Lymphocytes Relative: 9 % — ABNORMAL LOW (ref 12–46)
Lymphs Abs: 1.1 10*3/uL (ref 0.7–4.0)
MCH: 30.1 pg (ref 26.0–34.0)
MCHC: 32.3 g/dL (ref 30.0–36.0)
MCV: 93.1 fL (ref 78.0–100.0)
Monocytes Absolute: 1.4 10*3/uL — ABNORMAL HIGH (ref 0.1–1.0)
Monocytes Relative: 12 % (ref 3–12)
Neutro Abs: 9.2 10*3/uL — ABNORMAL HIGH (ref 1.7–7.7)
Neutrophils Relative %: 77 % (ref 43–77)
Platelets: 143 10*3/uL — ABNORMAL LOW (ref 150–400)
RBC: 3.06 MIL/uL — ABNORMAL LOW (ref 4.22–5.81)
RDW: 13.9 % (ref 11.5–15.5)
WBC: 11.9 10*3/uL — ABNORMAL HIGH (ref 4.0–10.5)

## 2015-04-15 LAB — BASIC METABOLIC PANEL
Anion gap: 7 (ref 5–15)
BUN: 12 mg/dL (ref 6–20)
CO2: 29 mmol/L (ref 22–32)
Calcium: 8.2 mg/dL — ABNORMAL LOW (ref 8.9–10.3)
Chloride: 99 mmol/L — ABNORMAL LOW (ref 101–111)
Creatinine, Ser: 0.82 mg/dL (ref 0.61–1.24)
GFR calc Af Amer: >60 mL/min (ref >60)
GFR calc non Af Amer: >60 mL/min (ref >60)
Glucose, Bld: 123 mg/dL — ABNORMAL HIGH (ref 65–99)
Potassium: 3.2 mmol/L — ABNORMAL LOW (ref 3.5–5.1)
Sodium: 135 mmol/L (ref 135–145)

## 2015-04-15 MED ORDER — WARFARIN SODIUM 5 MG PO TABS
5.0000 mg | ORAL_TABLET | Freq: Once | ORAL | Status: AC
  Administered 2015-04-15: 5 mg via ORAL
  Filled 2015-04-15: qty 1

## 2015-04-15 NOTE — Op Note (Signed)
[name], [name]               ACCOUNT NO.:  [id number]  MEDICAL RECORD NO.:  [id number]  LOCATION:  9509                         FACILITY:  [facility]  PHYSICIAN:  Kipp Brood. Caelynn Marshman, M.D.DATE OF BIRTH:  06-09-1936  DATE OF PROCEDURE:  04/14/2015 DATE OF DISCHARGE:                              OPERATIVE REPORT   SURGEON:  Kipp Brood. Gladstone Lighter, M.D.  ASSISTANT:  Ardeen Jourdain, P.A.  PREOPERATIVE DIAGNOSIS:  Severe bone-on-bone primary osteoarthritis of the left knee.  POSTOPERATIVE DIAGNOSIS:  Severe bone-on-bone primary osteoarthritis of the left knee.  OPERATION:  Left total knee arthroplasty utilizing DePuy system, all 3 components were cemented.  Gentamicin was used in the cement.  The sizes used wee the femur was a size 5 left posterior cruciate sacrificing femoral component, tibial tray size 5, the insert was a size 5, 12.5 mm thickness polyethylene rotating platform insert, the patella was a size 41 with 3 pegs.  DESCRIPTION OF PROCEDURE:  Under general anesthesia, routine orthopedic prepping and draping of the left lower extremity was carried out.  The appropriate time-out was first carried out.  I also marked the appropriate left leg in the holding area.  At this time, the patient had 2 g of IV Ancef and tranexamic acid.  At this point, the leg was exsanguinated and Esmarch tourniquet was elevated to 325 mmHg.  The leg was then placed in the [facility] knee holder.  An anterior approach of the knee was carried out with the knee flexed.  Two flaps were created.  I then carried out a median parapatellar incision.  I reflected the patella laterally, removed large spurs from the patella.  Following that, medial and lateral meniscectomies were carried out.  I excised the anterior and posterior cruciate ligaments.  Note, the knee was extremely tight, starting out.  At this particular point, an initial drill hole was made in the intercondylar notch and then the canal finder then  was inserted.  I then removed that and thoroughly irrigated out the canal. I removed 12 mm thickness off the distal femur at this point.  We then measured the femur to be a size 5 left.  We did the anterior, posterior, and chamfering cuts for a size 5 left femur.  Following that, attention was directed to the tibia.  I then made my initial drill hole in the tibial plateau and the intramedullary guide was inserted.  I then removed 6 mm thickness off the affected medial side of the tibia.  We had an excellent cut.  Following that, we then removed the posterior spurs from the distal femur.  We also removed 2 large loose bodies from the knee that were posteriorly and they were forced forward.  Following that, we then inserted our spacer blocks and felt that the 12.5 mm thickness block would be the appropriate size.  At that time, we then continued and did our keel cut out of the proximal tibia.  We then did our notch cut out of the distal femur.  Note, the bone was extremely hard.  We thoroughly irrigated out the area and inserted our trial components, and we had an excellent fit with the 12.5 mm thickness insert.  I then did a resurfacing procedure on the patella for a size 41 patella.  Three drill holes were made on the articular surface of the patella.  Following that, we then removed the trial components, thoroughly water picked out the knee, and then cemented all 3 components in simultaneously with gentamicin in the cement.  Once the cement was hardened, we went through motion again and finally did agree to use a 12.5 mm thickness insert.  Removed all loose pieces of cement.  I then utilized the water pick to water picked out the loose fragments.  I then injected 20 mL of 0.25% Marcaine.  At this time, we then inserted our permanent rotating platform size 5, 12.5 mm thickness.  We reduced the knee and had excellent function.  We then inserted a Hemovac drain and then closed the wound in  layers in usual fashion.  Prior to doing that though I used 1 anchor to anchor down the patellar tendon at this point.  We irrigated with antibiotic solution as I mentioned.  Then, the wound was closed in usual fashion.          ______________________________ Kipp Brood Gladstone Lighter, M.D.     RAG/MEDQ  D:  04/14/2015  T:  04/15/2015  Job:  [id number]

## 2015-04-15 NOTE — Progress Notes (Signed)
OT Cancellation Note  Patient Details Name: [name] MRN: [id number] DOB: [birth year]   Cancelled Treatment:    Reason Eval/Treat Not Completed: OT screened, no needs identified, will sign off.  Pt has all DME and AE, and doesn't feel he needs OT intervention at this time.   Darlina Rumpf [place], OTR/L [phone]  04/15/2015, 1:58 PM

## 2015-04-15 NOTE — Progress Notes (Signed)
Physical Therapy Treatment Patient Details Name: [name] MRN: [id number] DOB: Mar 19, 1936 Today's Date: 04-29-2015    History of Present Illness L TKA    PT Comments    Pt limited this pm by pain, RN medicated just prior to session; lab tech arrived to draw blood during session; should continue to progress well once pain better controlled  Follow Up Recommendations  Home health PT;Supervision/Assistance - 24 hour     Equipment Recommendations  None recommended by PT    Recommendations for Other Services       Precautions / Restrictions Precautions Precautions: Knee Required Braces or Orthoses: Knee Immobilizer - Left Knee Immobilizer - Left: Discontinue once straight leg raise with < 10 degree lag Restrictions Weight Bearing Restrictions: No Other Position/Activity Restrictions: WBAT    Mobility  Bed Mobility Overal bed mobility: Needs Assistance Bed Mobility: Sit to Supine       Sit to supine: Min assist   General bed mobility comments: assist with LLE into bed  Transfers Overall transfer level: Needs assistance Equipment used: Rolling walker (2 wheeled) Transfers: Sit to/from Stand Sit to Stand: Min guard         General transfer comment: cues for hand placement adn RLE position  Ambulation/Gait Ambulation/Gait assistance: Min assist Ambulation Distance (Feet): 6 Feet Assistive device: Rolling walker (2 wheeled) Gait Pattern/deviations: Step-to pattern;Trunk flexed;Antalgic     General Gait Details: cues for sequence, and posture   Stairs            Wheelchair Mobility    Modified Rankin (Stroke Patients Only)       Balance                                    Cognition Arousal/Alertness: Awake/alert Behavior During Therapy: WFL for tasks assessed/performed Overall Cognitive Status: Within Functional Limits for tasks assessed                      Exercises      General Comments        Pertinent  Vitals/Pain Pain Assessment: 0-10 Pain Score: 7  Pain Location: L knee Pain Descriptors / Indicators: Sore Pain Intervention(s): Limited activity within patient's tolerance;Monitored during session;Premedicated before session    Home Living                      Prior Function            PT Goals (current goals can now be found in the care plan section) Acute Rehab PT Goals Patient Stated Goal: to walk without pain PT Goal Formulation: With patient/family Time For Goal Achievement: 04/21/15 Potential to Achieve Goals: Good Progress towards PT goals: Progressing toward goals    Frequency  7X/week    PT Plan Current plan remains appropriate    Co-evaluation             End of Session Equipment Utilized During Treatment: Gait belt;Left knee immobilizer Activity Tolerance: Patient limited by pain Patient left: in bed;with call bell/phone within reach;with family/visitor present     Time: 8676-7209 PT Time Calculation (min) (ACUTE ONLY): 28 min  Charges:  $Therapeutic Activity: 8-22 mins                    G Codes:      Marisha Renier 04-29-2015, 4:28 PM

## 2015-04-15 NOTE — Care Management Note (Signed)
Case Management Note  Patient Details  Name: [name] MRN: [id number] Date of Birth: 13-Sep-1935  Subjective/Objective:                   LEFT TOTAL KNEE ARTHROPLASTY (Left) Action/Plan:  Discharge planning Expected Discharge Date:  04/16/15               Expected Discharge Plan:  [place]  In-House Referral:     Discharge planning Services  CM Consult  Post Acute Care Choice:  Home Health Choice offered to:  Patient  DME Arranged:  3-N-1 DME Agency:  [place]:  PT, RN [facility] Agency:  [place]  Status of Service:  Completed, signed off  Medicare Important Message Given:    Date Medicare IM Given:    Medicare IM give by:    Date Additional Medicare IM Given:    Additional Medicare Important Message give by:     If discussed at [place] of Stay Meetings, dates discussed:    Additional Comments: CM met with pt in room to offer choice of home health agency.  Pt chooses [facility] to render HHPT/RN.  Address and contact information verified with pt.  Referral given to [employer], Tim.  CM called [facility] DME rep, Lecretia to please deliver the 3n1 to room prior to discharge. Dellie Catholic, RN 04/15/2015, 1:15 PM

## 2015-04-15 NOTE — Progress Notes (Addendum)
ANTICOAGULATION CONSULT NOTE - Initial Consult  Pharmacy Consult for warfarin Indication: atrial fibrillation  No Known Allergies  Patient Measurements: Height: 5' 8.5" (174 cm) Weight: 172 lb (78.019 kg) IBW/kg (Calculated) : 69.55  Vital Signs: Temp: 99.7 F (37.6 C) (08/18 0526) Temp Source: Oral (08/18 0526) BP: 132/85 mmHg (08/18 0526) Pulse Rate: 96 (08/18 0526)  Labs:  Recent Labs  04/14/15 0715 04/15/15 0455  HGB  --  8.9*  HCT  --  27.2*  PLT  --  132*  LABPROT 15.3* 16.2*  INR 1.20 1.28  CREATININE  --  0.82    Estimated Creatinine Clearance: 74.3 mL/min (by C-G formula based on Cr of 0.82).   Medical History: Past Medical History  Diagnosis Date  . HTN (hypertension)   . Atrial fibrillation   . Aortic stenosis   . Erectile dysfunction   . Gout   . MVP (mitral valve prolapse)   . Baker cyst     Left knee  . Eczema   . DJD (degenerative joint disease)     knees  . Shortness of breath dyspnea     with activity    Medications:  Prescriptions prior to admission  Medication Sig Dispense Refill Last Dose  . allopurinol (ZYLOPRIM) 300 MG tablet TAKE ONE TABLET BY MOUTH EVERY DAY 100 tablet 3 04/14/2015 at 0430  . amLODipine (NORVASC) 5 MG tablet One tab daily (Patient taking differently: Take 5 mg by mouth daily. One tab daily) 90 tablet 3 04/14/2015 at 0430  . enoxaparin (LOVENOX) 120 MG/0.8ML injection Inject 0.77 mLs (115 mg total) into the skin daily. 3 Syringe 1 04/13/2015 at 1100  . furosemide (LASIX) 20 MG tablet One tablet daily as needed for swelling 30 tablet 6 04/13/2015 at 0700  . naproxen sodium (ANAPROX) 220 MG tablet Take 440 mg by mouth every morning.   Past Week at Unknown time  . tamsulosin (FLOMAX) 0.4 MG CAPS capsule Take 0.4 mg by mouth daily.    04/13/2015 at 0700  . traMADol (ULTRAM) 50 MG tablet Take 50 mg by mouth every 6 (six) hours as needed for severe pain.   04/13/2015 at [place]  . traZODone (DESYREL) 50 MG tablet TAKE 1 TAB AT  BEDTIME AS NEEDED (TO REPLACE AMBIEN ) 90 tablet 3 04/13/2015 at 2030  . warfarin (COUMADIN) 5 MG tablet Take as directed by the coumadin clinic (Patient taking differently: Take 2.5-5 mg by mouth daily. '5mg'$  on Mon, wed, and fri. Take 2.5 mg on tues, thus, sat, and sun) 90 tablet 1 04/09/2015 at 0700  . oxyCODONE-acetaminophen (PERCOCET/ROXICET) 5-325 MG per tablet Take 1-2 tablets by mouth every 4 (four) hours as needed for moderate pain. (Patient not taking: Reported on 03/29/2015) 60 tablet 0   . predniSONE (DELTASONE) 20 MG tablet TAKE ONE-HALF TABLET BY MOUTH EVERY OTHER DAY (Patient not taking: Reported on 03/29/2015) 50 tablet 0    Scheduled:  . allopurinol  300 mg Oral Daily  . amLODipine  5 mg Oral Daily  . celecoxib  200 mg Oral Q12H  . ferrous sulfate  325 mg Oral TID PC  . furosemide  20 mg Oral Daily  . tamsulosin  0.4 mg Oral Daily  . traZODone  50 mg Oral QHS  . Warfarin - Pharmacist Dosing Inpatient   Does not apply q1800    Assessment: 79yoM admitted for TKA; on warfarin PTA for Afib.  Now to resume warfarin post-procedure.   Baseline INR wnl after holding since 8/12  Prior anticoagulation: warfarin 2.5 mg daily except 5 mg MWF; bridged to procedure w/ Lovenox 1.5 mg/kg daily)  Significant events:  Today, 04/15/2015:  INR subtherapeutic  CBC: Hgb and pltc decreased post-op (suspect ABLA)  Drug interactions: celecoxib, PTA Aleve  No bleeding issues noted  Diet: clear liquid  Goal of Therapy: INR 2-3  Plan:  Warfarin 5 mg PO tonight at 18:00  Daily INR  CBC at least q72 hr while on warfarin - monitor Hgb and pltc trends  Monitor for signs of bleeding or thrombosis  At discharge, resume home warfarin regimen (as per medication history)   Doreene Eland, PharmD, BCPS.   Pager: [phone] 04/15/2015, 7:08 AM

## 2015-04-15 NOTE — Progress Notes (Signed)
Physical Therapy Treatment Patient Details Name: [name] MRN: [id number] DOB: 04/18/1936 Today's Date: 05/01/15    History of Present Illness L TKA    PT Comments    Pt progressing well, knee quite edematous, pt friend instructed in how to use ice packs and KI (if needed) at home  Follow Up Recommendations  Home health PT;Supervision/Assistance - 24 hour     Equipment Recommendations  None recommended by PT    Recommendations for Other Services       Precautions / Restrictions Precautions Precautions: Knee Restrictions Weight Bearing Restrictions: No Other Position/Activity Restrictions: WBAT    Mobility  Bed Mobility               General bed mobility comments: OOB  Transfers Overall transfer level: Needs assistance Equipment used: Rolling walker (2 wheeled) Transfers: Sit to/from Stand Sit to Stand: Min guard         General transfer comment: cues for hand palcement  Ambulation/Gait Ambulation/Gait assistance: Min guard;Supervision Ambulation Distance (Feet): 100 Feet Assistive device: Rolling walker (2 wheeled) Gait Pattern/deviations: Step-to pattern;Antalgic;Trunk flexed     General Gait Details: cues for sequence   Stairs            Wheelchair Mobility    Modified Rankin (Stroke Patients Only)       Balance                                    Cognition Arousal/Alertness: Awake/alert Behavior During Therapy: WFL for tasks assessed/performed Overall Cognitive Status: Within Functional Limits for tasks assessed                      Exercises Total Joint Exercises Ankle Circles/Pumps: AROM;Both;10 reps Quad Sets: AROM;Strengthening;Both;10 reps Heel Slides: Left;AAROM;10 reps Straight Leg Raises: AROM;AAROM;Strengthening;Left;10 reps    General Comments        Pertinent Vitals/Pain Pain Assessment: 0-10 Pain Score: 3  Pain Location: L knee Pain Descriptors / Indicators: Sore Pain  Intervention(s): Limited activity within patient's tolerance;Monitored during session;Ice applied;Premedicated before session    Home Living                      Prior Function            PT Goals (current goals can now be found in the care plan section) Acute Rehab PT Goals Patient Stated Goal: to walk without pain PT Goal Formulation: With patient/family Time For Goal Achievement: 04/21/15 Potential to Achieve Goals: Good Progress towards PT goals: Progressing toward goals    Frequency  7X/week    PT Plan Current plan remains appropriate    Co-evaluation             End of Session Equipment Utilized During Treatment: Gait belt;Left knee immobilizer Activity Tolerance: Patient tolerated treatment well Patient left: in chair;with call bell/phone within reach;with family/visitor present     Time: 4765-4650 PT Time Calculation (min) (ACUTE ONLY): 32 min  Charges:  $Gait Training: 8-22 mins $Therapeutic Exercise: 8-22 mins                    G Codes:      Ayva Veilleux 05-01-15, 12:14 PM

## 2015-04-15 NOTE — Progress Notes (Signed)
Subjective: 1 Day Post-Op Procedure(s) (LRB): LEFT TOTAL KNEE ARTHROPLASTY (Left) Patient reports pain as 2 on 0-10 scale. Doing very well. Hemovac DCd. Foley DCd.   Objective: Vital signs in last 24 hours: Temp:  [97.8 F (36.6 C)-99.7 F (37.6 C)] 99.7 F (37.6 C) (08/18 0526) Pulse Rate:  [89-105] 96 (08/18 0526) Resp:  [11-22] 16 (08/18 0526) BP: (130-181)/(59-99) 132/85 mmHg (08/18 0526) SpO2:  [92 %-100 %] 96 % (08/18 0526)  Intake/Output from previous day: 08/17 0701 - 08/18 0700 In: 4280 [P.O.:840; I.V.:3285; IV Piggyback:155] Out: 3400 [Urine:3200; Drains:150; Blood:50] Intake/Output this shift:     Recent Labs  04/15/15 0455  HGB 8.9*    Recent Labs  04/15/15 0455  WBC 9.9  RBC 2.93*  HCT 27.2*  PLT 132*    Recent Labs  04/15/15 0455  NA 135  K 3.2*  CL 99*  CO2 29  BUN 12  CREATININE 0.82  GLUCOSE 123*  CALCIUM 8.2*    Recent Labs  04/14/15 0715 04/15/15 0455  INR 1.20 1.28    Dorsiflexion/Plantar flexion intact  Assessment/Plan: 1 Day Post-Op Procedure(s) (LRB): LEFT TOTAL KNEE ARTHROPLASTY (Left) Up with therapy  Quanda Pavlicek A 04/15/2015, 7:19 AM

## 2015-04-16 LAB — CBC
HCT: 26.1 % — ABNORMAL LOW (ref 39.0–52.0)
Hemoglobin: 8.5 g/dL — ABNORMAL LOW (ref 13.0–17.0)
MCH: 29.9 pg (ref 26.0–34.0)
MCHC: 32.6 g/dL (ref 30.0–36.0)
MCV: 91.9 fL (ref 78.0–100.0)
Platelets: 136 10*3/uL — ABNORMAL LOW (ref 150–400)
RBC: 2.84 MIL/uL — ABNORMAL LOW (ref 4.22–5.81)
RDW: 14 % (ref 11.5–15.5)
WBC: 9.2 10*3/uL (ref 4.0–10.5)

## 2015-04-16 LAB — BASIC METABOLIC PANEL
Anion gap: 9 (ref 5–15)
BUN: 15 mg/dL (ref 6–20)
CO2: 28 mmol/L (ref 22–32)
Calcium: 8.5 mg/dL — ABNORMAL LOW (ref 8.9–10.3)
Chloride: 96 mmol/L — ABNORMAL LOW (ref 101–111)
Creatinine, Ser: 0.87 mg/dL (ref 0.61–1.24)
GFR calc Af Amer: >60 mL/min (ref >60)
GFR calc non Af Amer: >60 mL/min (ref >60)
Glucose, Bld: 111 mg/dL — ABNORMAL HIGH (ref 65–99)
Potassium: 3.4 mmol/L — ABNORMAL LOW (ref 3.5–5.1)
Sodium: 133 mmol/L — ABNORMAL LOW (ref 135–145)

## 2015-04-16 LAB — PREPARE RBC (CROSSMATCH)

## 2015-04-16 LAB — HEMOGLOBIN AND HEMATOCRIT, BLOOD
HCT: 24.1 % — ABNORMAL LOW (ref 39.0–52.0)
Hemoglobin: 7.9 g/dL — ABNORMAL LOW (ref 13.0–17.0)

## 2015-04-16 LAB — PROTIME-INR
INR: 1.55 — ABNORMAL HIGH (ref 0.00–1.49)
Prothrombin Time: 18.6 seconds — ABNORMAL HIGH (ref 11.6–15.2)

## 2015-04-16 MED ORDER — WARFARIN SODIUM 5 MG PO TABS
5.0000 mg | ORAL_TABLET | Freq: Once | ORAL | Status: AC
  Administered 2015-04-16: 5 mg via ORAL
  Filled 2015-04-16: qty 1

## 2015-04-16 MED ORDER — METHOCARBAMOL 500 MG PO TABS: 500.0000 mg | ORAL_TABLET | Freq: Four times a day (QID) | ORAL | Status: DC | PRN

## 2015-04-16 MED ORDER — SODIUM CHLORIDE 0.9 % IV SOLN
Freq: Once | INTRAVENOUS | Status: AC
  Administered 2015-04-16: 17:00:00 via INTRAVENOUS

## 2015-04-16 MED ORDER — OXYCODONE-ACETAMINOPHEN 5-325 MG PO TABS: 1.0000 | ORAL_TABLET | ORAL | Status: DC | PRN

## 2015-04-16 NOTE — Progress Notes (Signed)
Physical Therapy Treatment Patient Details Name: [name] MRN: [id number] DOB: 1936-03-24 Today's Date: 04/16/2015    History of Present Illness L TKA    PT Comments    L knee and leg noted edematous, bruised. hgb now 7.9. Held ambulation this PM. Per RN, patient continues to have urinaryi ssues as well..  Follow Up Recommendations  Home health PT;Supervision/Assistance - 24 hour     Equipment Recommendations  None recommended by PT    Recommendations for Other Services       Precautions / Restrictions Precautions Precautions: Knee Precaution Comments: monitor HR and sats, h/o afib    Mobility  Bed Mobility               General bed mobility comments: in recliner  Transfers                    Ambulation/Gait             General Gait Details: hgb 7.9, HR was high earlier   Stairs            Wheelchair Mobility    Modified Rankin (Stroke Patients Only)       Balance                                    Cognition Arousal/Alertness: Awake/alert                          Exercises Total Joint Exercises Ankle Circles/Pumps: AROM;Both;10 reps Quad Sets: AROM;Strengthening;Both;10 reps Short Arc QuadSinclair Ship;Left;10 reps;Seated Heel Slides: AAROM;Left;10 reps;Supine Hip ABduction/ADduction: AROM;Left;10 reps;Supine Straight Leg Raises: AROM;AAROM;Strengthening;Left;10 reps Goniometric ROM: 15-50 L knee flexion    General Comments        Pertinent Vitals/Pain Pain Score: 3  Pain Location: L knee Pain Descriptors / Indicators: Tightness Pain Intervention(s): Monitored during session;Ice applied    Home Living                      Prior Function            PT Goals (current goals can now be found in the care plan section) Progress towards PT goals: Not progressing toward goals - comment (limited by incr. HR today)    Frequency  7X/week    PT Plan Current plan remains appropriate     Co-evaluation             End of Session   Activity Tolerance: Patient tolerated treatment well Patient left: in chair;with call bell/phone within reach     Time: 1537-1555 PT Time Calculation (min) (ACUTE ONLY): 18 min  Charges:  $Therapeutic Exercise: 8-22 mins                    G Codes:      Claretha Cooper 04/16/2015, 4:22 PM .signn

## 2015-04-16 NOTE — Progress Notes (Addendum)
Physical Therapy Treatment Patient Details Name: [name] MRN: [id number] DOB: [birth year] Today's Date: 04/16/2015    History of Present Illness L TKA    PT Comments    Patient's HR 123 after 25 '. Ambulated total of 50'. Rechecked after rest x 5 minutes w/ HR at 115. RN aware. Palpated HR radial/Patient also noted to have SOB 2-3/4. Sats 97% on /RA.  Follow Up Recommendations  Home health PT;Supervision/Assistance - 24 hour     Equipment Recommendations       Recommendations for Other Services       Precautions / Restrictions Precautions Precautions: Knee Precaution Comments: monitor HR and sats, h/o afib    Mobility  Bed Mobility                  Transfers Overall transfer level: Needs assistance Equipment used: Rolling walker (2 wheeled)   Sit to Stand: Min guard         General transfer comment: cues for hand placement adn RLE position  Ambulation/Gait Ambulation/Gait assistance: Min guard Ambulation Distance (Feet): 50 Feet Assistive device: Rolling walker (2 wheeled) Gait Pattern/deviations: Step-through pattern;Step-to pattern     General Gait Details: cues for sequence, and posture   Stairs            Wheelchair Mobility    Modified Rankin (Stroke Patients Only)       Balance                                    Cognition Arousal/Alertness: Awake/alert                          Exercises      General Comments        Pertinent Vitals/Pain Pain Score: 3  Pain Location: L knee Pain Descriptors / Indicators: Sore Pain Intervention(s): Limited activity within patient's tolerance;Monitored during session;Premedicated before session;Ice applied    Home Living                      Prior Function            PT Goals (current goals can now be found in the care plan section) Progress towards PT goals: Progressing toward goals    Frequency  7X/week    PT Plan Current plan  remains appropriate    Co-evaluation             End of Session   Activity Tolerance: Treatment limited secondary to medical complications (Comment) (HR increased to 123) Patient left: in chair;with call bell/phone within reach;with family/visitor present     Time: 1114-1130 PT Time Calculation (min) (ACUTE ONLY): 16 min  Charges:  $Gait Training: 8-22 mins                    G Codes:      Claretha Cooper 04/16/2015, 11:46 AM Tresa Endo PT [phone]

## 2015-04-16 NOTE — Care Management Important Message (Signed)
Important Message  Patient Details  Name: [name] MRN: [id number] Date of Birth: [birth year]   Medicare Important Message Given:  Yes-second notification given    [name] 04/16/2015, 12:01 [place] Message  Patient Details  Name: [name] MRN: [id number] Date of Birth: 1936/01/20   Medicare Important Message Given:  Yes-second notification given    [name] 04/16/2015, 12:01 PM

## 2015-04-16 NOTE — Discharge Instructions (Signed)
INSTRUCTIONS AFTER JOINT REPLACEMENT  ° °Remove items at home which could result in a fall. This includes throw rugs or furniture in walking pathways °ICE to the affected joint every three hours while awake for 30 minutes at a time, for at least the first 3-5 days, and then as needed for pain and swelling.  Continue to use ice for pain and swelling. You may notice swelling that will progress down to the foot and ankle.  This is normal after surgery.  Elevate your leg when you are not up walking on it.   °Continue to use the breathing machine you got in the hospital (incentive spirometer) which will help keep your temperature down.  It is common for your temperature to cycle up and down following surgery, especially at night when you are not up moving around and exerting yourself.  The breathing machine keeps your lungs expanded and your temperature down. ° ° °DIET:  As you were doing prior to hospitalization, we recommend a well-balanced diet. ° °DRESSING / WOUND CARE / SHOWERING ° °Keep the surgical dressing until follow up.  The dressing is water proof, so you can shower without any extra covering.  IF THE DRESSING FALLS OFF or the wound gets wet inside, change the dressing with sterile gauze.  Please use good hand washing techniques before changing the dressing.  Do not use any lotions or creams on the incision until instructed by your surgeon.   ° °ACTIVITY ° °Increase activity slowly as tolerated, but follow the weight bearing instructions below.   °No driving for 6 weeks or until further direction given by your physician.  You cannot drive while taking narcotics.  °No lifting or carrying greater than 10 lbs. until further directed by your surgeon. °Avoid periods of inactivity such as sitting longer than an hour when not asleep. This helps prevent blood clots.  °You may return to work once you are authorized by your doctor.  ° ° ° °WEIGHT BEARING  ° °Weight bearing as tolerated with assist device (walker, cane,  etc) as directed, use it as long as suggested by your surgeon or therapist, typically at least 4-6 weeks. ° ° °EXERCISES ° °Results after joint replacement surgery are often greatly improved when you follow the exercise, range of motion and muscle strengthening exercises prescribed by your doctor. Safety measures are also important to protect the joint from further injury. Any time any of these exercises cause you to have increased pain or swelling, decrease what you are doing until you are comfortable again and then slowly increase them. If you have problems or questions, call your caregiver or physical therapist for advice.  ° °Rehabilitation is important following a joint replacement. After just a few days of immobilization, the muscles of the leg can become weakened and shrink (atrophy).  These exercises are designed to build up the tone and strength of the thigh and leg muscles and to improve motion. Often times heat used for twenty to thirty minutes before working out will loosen up your tissues and help with improving the range of motion but do not use heat for the first two weeks following surgery (sometimes heat can increase post-operative swelling).  ° °These exercises can be done on a training (exercise) mat, on the floor, on a table or on a bed. Use whatever works the best and is most comfortable for you.    Use music or television while you are exercising so that the exercises are a pleasant break in your   day. This will make your life better with the exercises acting as a break in your routine that you can look forward to.   Perform all exercises about fifteen times, three times per day or as directed.  You should exercise both the operative leg and the other leg as well.   Exercises include:   Quad Sets - Tighten up the muscle on the front of the thigh (Quad) and hold for 5-10 seconds.   Straight Leg Raises - With your knee straight (if you were given a brace, keep it on), lift the leg to 60  degrees, hold for 3 seconds, and slowly lower the leg.  Perform this exercise against resistance later as your leg gets stronger.  Leg Slides: Lying on your back, slowly slide your foot toward your buttocks, bending your knee up off the floor (only go as far as is comfortable). Then slowly slide your foot back down until your leg is flat on the floor again.  Angel Wings: Lying on your back spread your legs to the side as far apart as you can without causing discomfort.  Hamstring Strength:  Lying on your back, push your heel against the floor with your leg straight by tightening up the muscles of your buttocks.  Repeat, but this time bend your knee to a comfortable angle, and push your heel against the floor.  You may put a pillow under the heel to make it more comfortable if necessary.   A rehabilitation program following joint replacement surgery can speed recovery and prevent re-injury in the future due to weakened muscles. Contact your doctor or a physical therapist for more information on knee rehabilitation.    CONSTIPATION  Constipation is defined medically as fewer than three stools per week and severe constipation as less than one stool per week.  Even if you have a regular bowel pattern at home, your normal regimen is likely to be disrupted due to multiple reasons following surgery.  Combination of anesthesia, postoperative narcotics, change in appetite and fluid intake all can affect your bowels.   YOU MUST use at least one of the following options; they are listed in order of increasing strength to get the job done.  They are all available over the counter, and you may need to use some, POSSIBLY even all of these options:    Drink plenty of fluids (prune juice may be helpful) and high fiber foods Colace 100 mg by mouth twice a day  Senokot for constipation as directed and as needed Dulcolax (bisacodyl), take with full glass of water  Miralax (polyethylene glycol) once or twice a day as  needed.  If you have tried all these things and are unable to have a bowel movement in the first 3-4 days after surgery call either your surgeon or your primary doctor.    If you experience loose stools or diarrhea, hold the medications until you stool forms back up.  If your symptoms do not get better within 1 week or if they get worse, check with your doctor.  If you experience "the worst abdominal pain ever" or develop nausea or vomiting, please contact the office immediately for further recommendations for treatment.   ITCHING:  If you experience itching with your medications, try taking only a single pain pill, or even half a pain pill at a time.  You can also use Benadryl over the counter for itching or also to help with sleep.   TED HOSE STOCKINGS:  Use stockings on  both legs until for at least 2 weeks or as directed by physician office. They may be removed at night for sleeping.  MEDICATIONS:  See your medication summary on the After Visit Summary that nursing will review with you.  You may have some home medications which will be placed on hold until you complete the course of blood thinner medication.  It is important for you to complete the blood thinner medication as prescribed.  PRECAUTIONS:  If you experience chest pain or shortness of breath - call 911 immediately for transfer to the hospital emergency department.   If you develop a fever greater that 101 F, purulent drainage from wound, increased redness or drainage from wound, foul odor from the wound/dressing, or calf pain - CONTACT YOUR SURGEON.                                                   FOLLOW-UP APPOINTMENTS:  If you do not already have a post-op appointment, please call the office for an appointment to be seen by your surgeon.  Guidelines for how soon to be seen are listed in your After Visit Summary, but are typically between 1-4 weeks after surgery.    MAKE SURE YOU:  Understand these instructions.  Get help  right away if you are not doing well or get worse.    Thank you for letting us be a part of your medical care team.  It is a privilege we respect greatly.  We hope these instructions will help you stay on track for a fast and full recovery!

## 2015-04-16 NOTE — Progress Notes (Signed)
   Subjective: 2 Days Post-Op Procedure(s) (LRB): LEFT TOTAL KNEE ARTHROPLASTY (Left) Patient reports pain as mild.   Patient seen in rounds for Dr. Gladstone Lighter. Patient is well, and has had no acute complaints or problems other than urinary retention. No SOB or chest pain.  Plan is to go Home after hospital stay.  Objective: Vital signs in last 24 hours: Temp:  [98.1 F (36.7 C)-98.6 F (37 C)] 98.3 F (36.8 C) (08/19 0604) Pulse Rate:  [93-112] 102 (08/19 0604) Resp:  [16] 16 (08/19 0604) BP: (116-146)/(61-76) 124/61 mmHg (08/19 0604) SpO2:  [84 %-100 %] 92 % (08/19 0604)  Intake/Output from previous day:  Intake/Output Summary (Last 24 hours) at 04/16/15 0752 Last data filed at 04/16/15 0551  Gross per 24 hour  Intake    760 ml  Output   1925 ml  Net  -1165 ml     Labs:  Recent Labs  04/15/15 0455 04/15/15 1553 04/16/15 0546  HGB 8.9* 9.2* 8.5*    Recent Labs  04/15/15 1553 04/16/15 0546  WBC 11.9* 9.2  RBC 3.06* 2.84*  HCT 28.5* 26.1*  PLT 143* 136*    Recent Labs  04/15/15 0455 04/16/15 0546  NA 135 133*  K 3.2* 3.4*  CL 99* 96*  CO2 29 28  BUN 12 15  CREATININE 0.82 0.87  GLUCOSE 123* 111*  CALCIUM 8.2* 8.5*    Recent Labs  04/15/15 0455 04/16/15 0546  INR 1.28 1.55*    EXAM General - Patient is Alert and Oriented Extremity - Neurologically intact Intact pulses distally Dorsiflexion/Plantar flexion intact Compartment soft Dressing/Incision - clean, dry, no drainage Motor Function - intact, moving foot and toes well on exam.   Past Medical History  Diagnosis Date  . HTN (hypertension)   . Atrial fibrillation   . Aortic stenosis   . Erectile dysfunction   . Gout   . MVP (mitral valve prolapse)   . Baker cyst     Left knee  . Eczema   . DJD (degenerative joint disease)     knees  . Shortness of breath dyspnea     with activity    Assessment/Plan: 2 Days Post-Op Procedure(s) (LRB): LEFT TOTAL KNEE ARTHROPLASTY  (Left) Active Problems:   History of total knee arthroplasty  Estimated body mass index is 25.77 kg/(m^2) as calculated from the following:   Height as of this encounter: 5' 8.5" (1.74 m).   Weight as of this encounter: 78.019 kg (172 lb). Advance diet Up with therapy D/C IV fluids Discharge home with home health pending progress  DVT Prophylaxis - Coumadin Weight-Bearing as tolerated   Continue PT. Will allow one more in and out cath before placement of foley. Patient has been voiding on his own this morning. Will recheck Hgb this afternoon.   Ardeen Jourdain, PA-C Orthopaedic Surgery 04/16/2015, 7:52 AM

## 2015-04-16 NOTE — Progress Notes (Signed)
ANTICOAGULATION CONSULT NOTE   Pharmacy Consult for warfarin Indication: atrial fibrillation  No Known Allergies  Patient Measurements: Height: 5' 8.5" (174 cm) Weight: 172 lb (78.019 kg) IBW/kg (Calculated) : 69.55  Vital Signs: Temp: 98.3 F (36.8 C) (08/19 0604) Temp Source: Oral (08/19 0604) BP: 124/61 mmHg (08/19 0604) Pulse Rate: 102 (08/19 0604)  Labs:  Recent Labs  04/14/15 0715  04/15/15 0455 04/15/15 1553 04/16/15 0546  HGB  --   < > 8.9* 9.2* 8.5*  HCT  --   --  27.2* 28.5* 26.1*  PLT  --   --  132* 143* 136*  LABPROT 15.3*  --  16.2*  --  18.6*  INR 1.20  --  1.28  --  1.55*  CREATININE  --   --  0.82  --  0.87  < > = values in this interval not displayed.  Estimated Creatinine Clearance: 70 mL/min (by C-G formula based on Cr of 0.87).   Medical History: Past Medical History  Diagnosis Date  . HTN (hypertension)   . Atrial fibrillation   . Aortic stenosis   . Erectile dysfunction   . Gout   . MVP (mitral valve prolapse)   . Baker cyst     Left knee  . Eczema   . DJD (degenerative joint disease)     knees  . Shortness of breath dyspnea     with activity    Medications:  Prescriptions prior to admission  Medication Sig Dispense Refill Last Dose  . allopurinol (ZYLOPRIM) 300 MG tablet TAKE ONE TABLET BY MOUTH EVERY DAY 100 tablet 3 04/14/2015 at 0430  . amLODipine (NORVASC) 5 MG tablet One tab daily (Patient taking differently: Take 5 mg by mouth daily. One tab daily) 90 tablet 3 04/14/2015 at 0430  . enoxaparin (LOVENOX) 120 MG/0.8ML injection Inject 0.77 mLs (115 mg total) into the skin daily. 3 Syringe 1 04/13/2015 at 1100  . furosemide (LASIX) 20 MG tablet One tablet daily as needed for swelling 30 tablet 6 04/13/2015 at 0700  . naproxen sodium (ANAPROX) 220 MG tablet Take 440 mg by mouth every morning.   Past Week at Unknown time  . tamsulosin (FLOMAX) 0.4 MG CAPS capsule Take 0.4 mg by mouth daily.    04/13/2015 at 0700  . traMADol (ULTRAM)  50 MG tablet Take 50 mg by mouth every 6 (six) hours as needed for severe pain.   04/13/2015 at [place]  . traZODone (DESYREL) 50 MG tablet TAKE 1 TAB AT BEDTIME AS NEEDED (TO REPLACE AMBIEN ) 90 tablet 3 04/13/2015 at 2030  . warfarin (COUMADIN) 5 MG tablet Take as directed by the coumadin clinic (Patient taking differently: Take 2.5-5 mg by mouth daily. '5mg'$  on Mon, wed, and fri. Take 2.5 mg on tues, thus, sat, and sun) 90 tablet 1 04/09/2015 at 0700  . oxyCODONE-acetaminophen (PERCOCET/ROXICET) 5-325 MG per tablet Take 1-2 tablets by mouth every 4 (four) hours as needed for moderate pain. (Patient not taking: Reported on 03/29/2015) 60 tablet 0   . predniSONE (DELTASONE) 20 MG tablet TAKE ONE-HALF TABLET BY MOUTH EVERY OTHER DAY (Patient not taking: Reported on 03/29/2015) 50 tablet 0    Scheduled:  . allopurinol  300 mg Oral Daily  . amLODipine  5 mg Oral Daily  . celecoxib  200 mg Oral Q12H  . ferrous sulfate  325 mg Oral TID PC  . furosemide  20 mg Oral Daily  . tamsulosin  0.4 mg Oral Daily  . traZODone  50 mg Oral QHS  . Warfarin - Pharmacist Dosing Inpatient   Does not apply q1800    Assessment: 79yoM admitted for TKA; on warfarin PTA for Afib.  Now to resume warfarin post-procedure.   Baseline INR wnl after holding since 8/12  Prior anticoagulation: warfarin 2.5 mg daily except 5 mg MWF; bridged to procedure w/ Lovenox 1.5 mg/kg daily)  Significant events:  Today, 04/16/2015:  INR subtherapeutic but trending up nicely  CBC: Hgb and pltc decreased post-op (suspect ABLA)  Drug interactions: celecoxib, PTA Aleve  No bleeding issues noted  Diet: regular  Goal of Therapy: INR 2-3  Plan:  Warfarin 5 mg PO tonight at 18:00  Daily INR  CBC at least q72 hr while on warfarin - monitor Hgb and pltc trends  Monitor for signs of bleeding or thrombosis  At discharge, resume home warfarin regimen (as per medication history)   Doreene Eland, PharmD, BCPS.   Pager:  [phone] 04/16/2015, 7:57 AM

## 2015-04-17 LAB — CBC
HCT: 30.1 % — ABNORMAL LOW (ref 39.0–52.0)
Hemoglobin: 9.8 g/dL — ABNORMAL LOW (ref 13.0–17.0)
MCH: 29.3 pg (ref 26.0–34.0)
MCHC: 32.6 g/dL (ref 30.0–36.0)
MCV: 89.9 fL (ref 78.0–100.0)
Platelets: 139 10*3/uL — ABNORMAL LOW (ref 150–400)
RBC: 3.35 MIL/uL — ABNORMAL LOW (ref 4.22–5.81)
RDW: 15.4 % (ref 11.5–15.5)
WBC: 10 10*3/uL (ref 4.0–10.5)

## 2015-04-17 LAB — PROTIME-INR
INR: 1.43 (ref 0.00–1.49)
Prothrombin Time: 17.5 seconds — ABNORMAL HIGH (ref 11.6–15.2)

## 2015-04-17 MED ORDER — WARFARIN SODIUM 5 MG PO TABS
5.0000 mg | ORAL_TABLET | Freq: Once | ORAL | Status: DC
  Filled 2015-04-17: qty 1

## 2015-04-17 MED ORDER — SULFAMETHOXAZOLE-TRIMETHOPRIM 800-160 MG PO TABS: 1.0000 | ORAL_TABLET | Freq: Two times a day (BID) | ORAL | Status: DC

## 2015-04-17 NOTE — Progress Notes (Signed)
Notified on-call PA of persistent drainage and swollen knee today before d/c. Instructions given to replace aquacel. Patient insisted that RN call Dr. Gladstone Lighter as well, and make sure he agrees with plan, because pt was concerned about amount of swelling and the presence of three fluid-filled blisters. Dr. Gladstone Lighter was notified of patient concerns. Dr. Gladstone Lighter told RN to remove aquacel, replace with ABDs, 4x4 gauze, kerlex, and Ace bandage. Also instructed patient to come into the office after 8am on Monday morning, and that he would fit him in to be seen and examined.   Pt to d/c home with [place] home health. Patient instructed not to perform ROM just ambulation until he sees Dr. Gladstone Lighter on Monday, per Drs instuctions. AVS reviewed and "My Chart" discussed with pt. Pt capable of verbalizing medications, dressing changes, signs and symptoms of infection, and follow-up appointments. Remains hemodynamically stable. No signs and symptoms of distress. Educated pt to return to ER in the case of SOB, dizziness, or chest pain. Pt and significant other educated on how to change and empty leg bag as well.

## 2015-04-17 NOTE — Progress Notes (Signed)
Physical Therapy Treatment Patient Details Name: [name] MRN: [id number] DOB: 1935-09-23 Today's Date: 04/17/2015    History of Present Illness L TKA    PT Comments    Practiced steps. Ready for DC.  Follow Up Recommendations  Home health PT;Supervision/Assistance - 24 hour     Equipment Recommendations  None recommended by PT    Recommendations for Other Services       Precautions / Restrictions Precautions Precautions: Knee Precaution Comments: monitor HR and sats, h/o afib Restrictions Weight Bearing Restrictions: No    Mobility  Bed Mobility                  Transfers   Equipment used: Rolling walker (2 wheeled) Transfers: Sit to/from Stand Sit to Stand: Supervision            Ambulation/Gait Ambulation/Gait assistance: Supervision Ambulation Distance (Feet): 50 Feet Assistive device: Rolling walker (2 wheeled) Gait Pattern/deviations: Step-to pattern;Step-through pattern;Antalgic;Trunk flexed     General Gait Details: cues for sequence and  posture   Stairs Stairs: Yes Stairs assistance: Min assist Stair Management: One rail Left;Step to pattern;Forwards Number of Stairs: 5 General stair comments: wife present for instructions.  Wheelchair Mobility    Modified Rankin (Stroke Patients Only)       Balance                                    Cognition Arousal/Alertness: Awake/alert                          Exercises   General Comments        Pertinent Vitals/Pain Pain Score: 2  Pain Location: 2 l knee Pain Descriptors / Indicators: Tightness Pain Intervention(s): Monitored during session    Home Living                      Prior Function            PT Goals (current goals can now be found in the care plan section) Progress towards PT goals: Progressing toward goals    Frequency  7X/week    PT Plan Current plan remains appropriate    Co-evaluation              End of Session Equipment Utilized During Treatment: Gait belt Activity Tolerance: Patient tolerated treatment well Patient left: in chair;with call bell/phone within reach     Time: 1022-1036 PT Time Calculation (min) (ACUTE ONLY): 14 min  Charges:  $Gait Training: 8-22 mins $Therapeutic Exercise: 8-22 mins                    G Codes:      [name] 04/17/2015, 11:00 AM

## 2015-04-17 NOTE — Progress Notes (Signed)
     Subjective: 3 Days Post-Op Procedure(s) (LRB): LEFT TOTAL KNEE ARTHROPLASTY (Left)   Patient reports pain as moderate, pain controlled on the medication.  Some urinary retention and his foley was placed.  He has had this issue after a previous surgery.  He had to leave with a foley in place.  We discussed doing this again and treating him with some antibiotic until he can follow up with his Urologist.  We talked about getting in with his Urologist within a week.  Objective:   VITALS:   Filed Vitals:   04/17/15 0600  BP: 158/79  Pulse: 116  Temp: 98.2 F (36.8 C)  Resp: 18    Dorsiflexion/Plantar flexion intact Incision: dressing C/D/I No cellulitis present Compartment soft  LABS  Recent Labs  04/15/15 1553 04/16/15 0546 04/16/15 1345 04/17/15 0418  HGB 9.2* 8.5* 7.9* 9.8*  HCT 28.5* 26.1* 24.1* 30.1*  WBC 11.9* 9.2  --  10.0  PLT 143* 136*  --  139*     Recent Labs  04/15/15 0455 04/16/15 0546  NA 135 133*  K 3.2* 3.4*  BUN 12 15  CREATININE 0.82 0.87  GLUCOSE 123* 111*     Assessment/Plan: 3 Days Post-Op Procedure(s) (LRB): LEFT TOTAL KNEE ARTHROPLASTY (Left) Up with therapy Discharge home with home health  Follow up in 2 weeks at [facility]. Follow up with Dr. Gladstone Lighter in 2 weeks.  Contact information:  [facility] [address] [place]ly   PAC  04/17/2015, 8:51 AM

## 2015-04-17 NOTE — Progress Notes (Signed)
See previous NCM notes dated 04/16/2015.  Home Health arranged with [facility] and pt reports having DME in room.  Jonnie Finner RN CCM Case Mgmt phone [phone]

## 2015-04-17 NOTE — Progress Notes (Signed)
Physical Therapy Treatment Patient Details Name: [name] MRN: [id number] DOB: Dec 29, 1935 Today's Date: 04/17/2015    History of Present Illness L TKA    PT Comments    Patient is progressing well. Practice steps next visit.  Follow Up Recommendations  Home health PT;Supervision/Assistance - 24 hour     Equipment Recommendations  None recommended by PT    Recommendations for Other Services       Precautions / Restrictions Precautions Precautions: Knee Precaution Comments: monitor HR and sats, h/o afib Restrictions Weight Bearing Restrictions: No    Mobility  Bed Mobility                  Transfers   Equipment used: Rolling walker (2 wheeled) Transfers: Sit to/from Stand Sit to Stand: Supervision            Ambulation/Gait Ambulation/Gait assistance: Supervision Ambulation Distance (Feet): 150 Feet Assistive device: Rolling walker (2 wheeled) Gait Pattern/deviations: Step-to pattern;Step-through pattern;Antalgic;Trunk flexed     General Gait Details: cues for sequence and  posture   Stairs            Wheelchair Mobility    Modified Rankin (Stroke Patients Only)       Balance                                    Cognition Arousal/Alertness: Awake/alert                          Exercises Total Joint Exercises Ankle Circles/Pumps: AROM;Both;10 reps Quad Sets: AROM;Strengthening;Both;10 reps Towel Squeeze: AROM;Both;10 reps;Supine Short Arc Quad: AAROM;Left;10 reps;Seated Heel Slides: AAROM;Left;10 reps;Supine Hip ABduction/ADduction: AROM;Left;10 reps;Supine Straight Leg Raises: AROM;AAROM;Strengthening;Left;10 reps Long Arc Quad: AROM;Left;10 reps;Seated Knee Flexion: AROM;Left;10 reps;Seated Goniometric ROM: 15-60 l knee flexion    General Comments        Pertinent Vitals/Pain Pain Score: 2  Pain Location: L knee Pain Descriptors / Indicators: Tightness Pain Intervention(s): Monitored  during session;Premedicated before session;Ice applied    Home Living                      Prior Function            PT Goals (current goals can now be found in the care plan section) Progress towards PT goals: Progressing toward goals    Frequency  7X/week    PT Plan Current plan remains appropriate    Co-evaluation             End of Session Equipment Utilized During Treatment: Gait belt Activity Tolerance: Patient tolerated treatment well Patient left: in chair;with call bell/phone within reach     Time: 0921-0952 PT Time Calculation (min) (ACUTE ONLY): 31 min  Charges:  $Gait Training: 8-22 mins $Therapeutic Exercise: 8-22 mins                    G Codes:      [name] 04/17/2015, 10:58 AM

## 2015-04-17 NOTE — Progress Notes (Signed)
ANTICOAGULATION CONSULT NOTE - Follow up  Pharmacy Consult for warfarin Indication: atrial fibrillation  No Known Allergies  Patient Measurements: Height: 5' 8.5" (174 cm) Weight: 172 lb (78.019 kg) IBW/kg (Calculated) : 69.55  Vital Signs: Temp: 98.2 F (36.8 C) (08/20 0600) Temp Source: Oral (08/20 0600) BP: 158/79 mmHg (08/20 0600) Pulse Rate: 116 (08/20 0600)  Labs:  Recent Labs  04/15/15 0455 04/15/15 1553 04/16/15 0546 04/16/15 1345 04/17/15 0418  HGB 8.9* 9.2* 8.5* 7.9* 9.8*  HCT 27.2* 28.5* 26.1* 24.1* 30.1*  PLT 132* 143* 136*  --  139*  LABPROT 16.2*  --  18.6*  --  17.5*  INR 1.28  --  1.55*  --  1.43  CREATININE 0.82  --  0.87  --   --     Estimated Creatinine Clearance: 70 mL/min (by C-G formula based on Cr of 0.87).   Medical History: Past Medical History  Diagnosis Date  . HTN (hypertension)   . Atrial fibrillation   . Aortic stenosis   . Erectile dysfunction   . Gout   . MVP (mitral valve prolapse)   . Baker cyst     Left knee  . Eczema   . DJD (degenerative joint disease)     knees  . Shortness of breath dyspnea     with activity    Medications:  Prescriptions prior to admission  Medication Sig Dispense Refill Last Dose  . allopurinol (ZYLOPRIM) 300 MG tablet TAKE ONE TABLET BY MOUTH EVERY DAY 100 tablet 3 04/14/2015 at 0430  . amLODipine (NORVASC) 5 MG tablet One tab daily (Patient taking differently: Take 5 mg by mouth daily. One tab daily) 90 tablet 3 04/14/2015 at 0430  . enoxaparin (LOVENOX) 120 MG/0.8ML injection Inject 0.77 mLs (115 mg total) into the skin daily. 3 Syringe 1 04/13/2015 at 1100  . furosemide (LASIX) 20 MG tablet One tablet daily as needed for swelling 30 tablet 6 04/13/2015 at 0700  . naproxen sodium (ANAPROX) 220 MG tablet Take 440 mg by mouth every morning.   Past Week at Unknown time  . tamsulosin (FLOMAX) 0.4 MG CAPS capsule Take 0.4 mg by mouth daily.    04/13/2015 at 0700  . traMADol (ULTRAM) 50 MG tablet Take  50 mg by mouth every 6 (six) hours as needed for severe pain.   04/13/2015 at [place]  . traZODone (DESYREL) 50 MG tablet TAKE 1 TAB AT BEDTIME AS NEEDED (TO REPLACE AMBIEN ) 90 tablet 3 04/13/2015 at 2030  . warfarin (COUMADIN) 5 MG tablet Take as directed by the coumadin clinic (Patient taking differently: Take 2.5-5 mg by mouth daily. '5mg'$  on Mon, wed, and fri. Take 2.5 mg on tues, thus, sat, and sun) 90 tablet 1 04/09/2015 at 0700  . oxyCODONE-acetaminophen (PERCOCET/ROXICET) 5-325 MG per tablet Take 1-2 tablets by mouth every 4 (four) hours as needed for moderate pain. (Patient not taking: Reported on 03/29/2015) 60 tablet 0   . predniSONE (DELTASONE) 20 MG tablet TAKE ONE-HALF TABLET BY MOUTH EVERY OTHER DAY (Patient not taking: Reported on 03/29/2015) 50 tablet 0    Scheduled:  . allopurinol  300 mg Oral Daily  . amLODipine  5 mg Oral Daily  . celecoxib  200 mg Oral Q12H  . ferrous sulfate  325 mg Oral TID PC  . furosemide  20 mg Oral Daily  . tamsulosin  0.4 mg Oral Daily  . traZODone  50 mg Oral QHS  . Warfarin - Pharmacist Dosing Inpatient   Does  not apply q1800    Assessment: 79yoM admitted for TKA; on warfarin PTA for Afib.  Now to resume warfarin post-procedure.  Baseline INR wnl after holding since 8/12  Prior anticoagulation: warfarin 2.5 mg daily except 5 mg MWF; bridged to procedure w/ Lovenox 1.5 mg/kg daily)  Significant events: 8/18: Surgery. Warfarin '5mg'$ . 8/19: INR rose some. Repeated '5mg'$ .  Today, 04/17/2015:  INR subtherapeutic, trended down overnight after '5mg'$  x 2.   CBC: Hgb and pltc decreased post-op (suspect ABLA)  Drug interactions: celecoxib, PTA Aleve  No bleeding issues noted  Diet: regular, tolerating  Goal of Therapy: INR 2-3  Plan:  Repeat warfarin '5mg'$  PO tonight at 18:00.  Daily INR.  CBC at least q72 hr while on warfarin - monitor Hgb and pltc trends.  Monitor for signs of bleeding or thrombosis.  Romeo Rabon, PharmD, pager [phone].  04/17/2015,9:56 AM.

## 2015-04-18 LAB — TYPE AND SCREEN
ABO/RH(D): O POS
Antibody Screen: NEGATIVE
Unit division: 0
Unit division: 0

## 2015-04-20 ENCOUNTER — Telehealth: Payer: Self-pay | Admitting: General Practice

## 2015-04-20 DIAGNOSIS — M109 Gout, unspecified: Secondary | ICD-10-CM | POA: Diagnosis not present

## 2015-04-20 DIAGNOSIS — Z96652 Presence of left artificial knee joint: Secondary | ICD-10-CM | POA: Diagnosis not present

## 2015-04-20 DIAGNOSIS — M1990 Unspecified osteoarthritis, unspecified site: Secondary | ICD-10-CM | POA: Diagnosis not present

## 2015-04-20 DIAGNOSIS — M4806 Spinal stenosis, lumbar region: Secondary | ICD-10-CM | POA: Diagnosis not present

## 2015-04-20 DIAGNOSIS — Z792 Long term (current) use of antibiotics: Secondary | ICD-10-CM | POA: Diagnosis not present

## 2015-04-20 DIAGNOSIS — D649 Anemia, unspecified: Secondary | ICD-10-CM | POA: Diagnosis not present

## 2015-04-20 DIAGNOSIS — Z952 Presence of prosthetic heart valve: Secondary | ICD-10-CM | POA: Diagnosis not present

## 2015-04-20 DIAGNOSIS — F419 Anxiety disorder, unspecified: Secondary | ICD-10-CM | POA: Diagnosis not present

## 2015-04-20 DIAGNOSIS — I4891 Unspecified atrial fibrillation: Secondary | ICD-10-CM | POA: Diagnosis not present

## 2015-04-20 DIAGNOSIS — Z7901 Long term (current) use of anticoagulants: Secondary | ICD-10-CM | POA: Diagnosis not present

## 2015-04-20 DIAGNOSIS — Z471 Aftercare following joint replacement surgery: Secondary | ICD-10-CM | POA: Diagnosis not present

## 2015-04-20 DIAGNOSIS — I10 Essential (primary) hypertension: Secondary | ICD-10-CM | POA: Diagnosis not present

## 2015-04-20 DIAGNOSIS — H8110 Benign paroxysmal vertigo, unspecified ear: Secondary | ICD-10-CM | POA: Diagnosis not present

## 2015-04-20 NOTE — Telephone Encounter (Signed)
Scheduled patient in coumadin clinic on 8/24.

## 2015-04-20 NOTE — Telephone Encounter (Signed)
Patient was requesting to speak with you. i had trouble understanding what exactly he was asking for because of the accent. I did pick up that he stated that coumadin was stopped a while ago. Thank you.

## 2015-04-21 ENCOUNTER — Ambulatory Visit (INDEPENDENT_AMBULATORY_CARE_PROVIDER_SITE_OTHER): Payer: Medicare Other | Admitting: General Practice

## 2015-04-21 DIAGNOSIS — I10 Essential (primary) hypertension: Secondary | ICD-10-CM | POA: Diagnosis not present

## 2015-04-21 DIAGNOSIS — M1990 Unspecified osteoarthritis, unspecified site: Secondary | ICD-10-CM | POA: Diagnosis not present

## 2015-04-21 DIAGNOSIS — Z952 Presence of prosthetic heart valve: Secondary | ICD-10-CM | POA: Diagnosis not present

## 2015-04-21 DIAGNOSIS — H8110 Benign paroxysmal vertigo, unspecified ear: Secondary | ICD-10-CM | POA: Diagnosis not present

## 2015-04-21 DIAGNOSIS — Z792 Long term (current) use of antibiotics: Secondary | ICD-10-CM | POA: Diagnosis not present

## 2015-04-21 DIAGNOSIS — R338 Other retention of urine: Secondary | ICD-10-CM | POA: Diagnosis not present

## 2015-04-21 DIAGNOSIS — Z5181 Encounter for therapeutic drug level monitoring: Secondary | ICD-10-CM | POA: Diagnosis not present

## 2015-04-21 DIAGNOSIS — D649 Anemia, unspecified: Secondary | ICD-10-CM | POA: Diagnosis not present

## 2015-04-21 DIAGNOSIS — Z96652 Presence of left artificial knee joint: Secondary | ICD-10-CM | POA: Diagnosis not present

## 2015-04-21 DIAGNOSIS — I4891 Unspecified atrial fibrillation: Secondary | ICD-10-CM | POA: Diagnosis not present

## 2015-04-21 DIAGNOSIS — I359 Nonrheumatic aortic valve disorder, unspecified: Secondary | ICD-10-CM | POA: Diagnosis not present

## 2015-04-21 DIAGNOSIS — M109 Gout, unspecified: Secondary | ICD-10-CM | POA: Diagnosis not present

## 2015-04-21 DIAGNOSIS — M4806 Spinal stenosis, lumbar region: Secondary | ICD-10-CM | POA: Diagnosis not present

## 2015-04-21 DIAGNOSIS — Z7901 Long term (current) use of anticoagulants: Secondary | ICD-10-CM | POA: Diagnosis not present

## 2015-04-21 DIAGNOSIS — Z471 Aftercare following joint replacement surgery: Secondary | ICD-10-CM | POA: Diagnosis not present

## 2015-04-21 DIAGNOSIS — F419 Anxiety disorder, unspecified: Secondary | ICD-10-CM | POA: Diagnosis not present

## 2015-04-21 LAB — POCT INR: INR: 2.6

## 2015-04-21 NOTE — Progress Notes (Signed)
Pre visit review using our clinic review tool, if applicable. No additional management support is needed unless otherwise documented below in the visit note. 

## 2015-04-21 NOTE — Progress Notes (Signed)
I have reviewed and agree with the plan. 

## 2015-04-23 DIAGNOSIS — I10 Essential (primary) hypertension: Secondary | ICD-10-CM | POA: Diagnosis not present

## 2015-04-23 DIAGNOSIS — M1990 Unspecified osteoarthritis, unspecified site: Secondary | ICD-10-CM | POA: Diagnosis not present

## 2015-04-23 DIAGNOSIS — Z952 Presence of prosthetic heart valve: Secondary | ICD-10-CM | POA: Diagnosis not present

## 2015-04-23 DIAGNOSIS — I4891 Unspecified atrial fibrillation: Secondary | ICD-10-CM | POA: Diagnosis not present

## 2015-04-23 DIAGNOSIS — M4806 Spinal stenosis, lumbar region: Secondary | ICD-10-CM | POA: Diagnosis not present

## 2015-04-23 DIAGNOSIS — Z792 Long term (current) use of antibiotics: Secondary | ICD-10-CM | POA: Diagnosis not present

## 2015-04-23 DIAGNOSIS — D649 Anemia, unspecified: Secondary | ICD-10-CM | POA: Diagnosis not present

## 2015-04-23 DIAGNOSIS — Z96652 Presence of left artificial knee joint: Secondary | ICD-10-CM | POA: Diagnosis not present

## 2015-04-23 DIAGNOSIS — H8110 Benign paroxysmal vertigo, unspecified ear: Secondary | ICD-10-CM | POA: Diagnosis not present

## 2015-04-23 DIAGNOSIS — F419 Anxiety disorder, unspecified: Secondary | ICD-10-CM | POA: Diagnosis not present

## 2015-04-23 DIAGNOSIS — Z7901 Long term (current) use of anticoagulants: Secondary | ICD-10-CM | POA: Diagnosis not present

## 2015-04-23 DIAGNOSIS — Z471 Aftercare following joint replacement surgery: Secondary | ICD-10-CM | POA: Diagnosis not present

## 2015-04-23 DIAGNOSIS — M109 Gout, unspecified: Secondary | ICD-10-CM | POA: Diagnosis not present

## 2015-04-26 DIAGNOSIS — Z96652 Presence of left artificial knee joint: Secondary | ICD-10-CM | POA: Diagnosis not present

## 2015-04-26 DIAGNOSIS — M1712 Unilateral primary osteoarthritis, left knee: Secondary | ICD-10-CM | POA: Diagnosis not present

## 2015-04-26 DIAGNOSIS — H8110 Benign paroxysmal vertigo, unspecified ear: Secondary | ICD-10-CM | POA: Diagnosis not present

## 2015-04-26 DIAGNOSIS — Z471 Aftercare following joint replacement surgery: Secondary | ICD-10-CM | POA: Diagnosis not present

## 2015-04-26 DIAGNOSIS — M109 Gout, unspecified: Secondary | ICD-10-CM | POA: Diagnosis not present

## 2015-04-26 DIAGNOSIS — I10 Essential (primary) hypertension: Secondary | ICD-10-CM | POA: Diagnosis not present

## 2015-04-26 DIAGNOSIS — I4891 Unspecified atrial fibrillation: Secondary | ICD-10-CM | POA: Diagnosis not present

## 2015-04-26 DIAGNOSIS — M1990 Unspecified osteoarthritis, unspecified site: Secondary | ICD-10-CM | POA: Diagnosis not present

## 2015-04-26 DIAGNOSIS — Z792 Long term (current) use of antibiotics: Secondary | ICD-10-CM | POA: Diagnosis not present

## 2015-04-26 DIAGNOSIS — M4806 Spinal stenosis, lumbar region: Secondary | ICD-10-CM | POA: Diagnosis not present

## 2015-04-26 DIAGNOSIS — F419 Anxiety disorder, unspecified: Secondary | ICD-10-CM | POA: Diagnosis not present

## 2015-04-26 DIAGNOSIS — Z952 Presence of prosthetic heart valve: Secondary | ICD-10-CM | POA: Diagnosis not present

## 2015-04-26 DIAGNOSIS — Z7901 Long term (current) use of anticoagulants: Secondary | ICD-10-CM | POA: Diagnosis not present

## 2015-04-26 DIAGNOSIS — D649 Anemia, unspecified: Secondary | ICD-10-CM | POA: Diagnosis not present

## 2015-04-27 NOTE — Discharge Summary (Signed)
Physician Discharge Summary   Patient ID: [name] MRN: [id number] DOB/AGE: Sep 22, 1935 79 y.o.  Admit date: 04/14/2015 Discharge date: 04/17/2015  Primary Diagnosis: Primary osteoarthritis left knee  Admission Diagnoses:  Past Medical History  Diagnosis Date  . HTN (hypertension)   . Atrial fibrillation   . Aortic stenosis   . Erectile dysfunction   . Gout   . MVP (mitral valve prolapse)   . Baker cyst     Left knee  . Eczema   . DJD (degenerative joint disease)     knees  . Shortness of breath dyspnea     with activity   Discharge Diagnoses:   Active Problems:   History of total knee arthroplasty  Estimated body mass index is 25.77 kg/(m^2) as calculated from the following:   Height as of this encounter: 5' 8.5" (1.74 m).   Weight as of this encounter: 78.019 kg (172 lb).  Procedure:  Procedure(s) (LRB): LEFT TOTAL KNEE ARTHROPLASTY (Left)   Consults: None  HPI: [name], 79 y.o. male, has a history of pain and functional disability in the left knee due to arthritis and has failed non-surgical conservative treatments for greater than 12 weeks to includeNSAID's and/or analgesics, corticosteriod injections and activity modification. Onset of symptoms was gradual, starting 5 years ago with gradually worsening course since that time. The patient noted prior procedures on the knee to include menisectomy on the left knee(s). Patient currently rates pain in the left knee(s) at 7 out of 10 with activity. Patient has night pain, worsening of pain with activity and weight bearing, pain that interferes with activities of daily living, pain with passive range of motion, crepitus and joint swelling. Patient has evidence of periarticular osteophytes and joint space narrowing by imaging studies.There is no active infection.  Laboratory Data: Admission on 04/14/2015, Discharged on 04/17/2015  Component Date Value Ref Range Status  . ABO/RH(D) 04/14/2015 O POS   Final  .  Antibody Screen 04/14/2015 NEG   Final  . Sample Expiration 04/14/2015 04/17/2015   Final  . Unit Number 04/14/2015 [id number]   Final  . Blood Component Type 04/14/2015 RED CELLS,LR   Final  . Unit division 04/14/2015 00   Final  . Status of Unit 04/14/2015 ISSUED,FINAL   Final  . Transfusion Status 04/14/2015 OK TO TRANSFUSE   Final  . Crossmatch Result 04/14/2015 Compatible   Final  . Unit Number 04/14/2015 [id number]   Final  . Blood Component Type 04/14/2015 RED CELLS,LR   Final  . Unit division 04/14/2015 00   Final  . Status of Unit 04/14/2015 ISSUED,FINAL   Final  . Transfusion Status 04/14/2015 OK TO TRANSFUSE   Final  . Crossmatch Result 04/14/2015 Compatible   Final  . Prothrombin Time 04/14/2015 15.3* 11.6 - 15.2 seconds Final  . INR 04/14/2015 1.20  0.00 - 1.49 Final  . Prothrombin Time 04/15/2015 16.2* 11.6 - 15.2 seconds Final  . INR 04/15/2015 1.28  0.00 - 1.49 Final  . WBC 04/15/2015 9.9  4.0 - 10.5 K/uL Final  . RBC 04/15/2015 2.93* 4.22 - 5.81 MIL/uL Final  . Hemoglobin 04/15/2015 8.9* 13.0 - 17.0 g/dL Final  . HCT 04/15/2015 27.2* 39.0 - 52.0 % Final  . MCV 04/15/2015 92.8  78.0 - 100.0 fL Final  . MCH 04/15/2015 30.4  26.0 - 34.0 pg Final  . MCHC 04/15/2015 32.7  30.0 - 36.0 g/dL Final  . RDW 04/15/2015 13.9  11.5 - 15.5 % Final  . Platelets  04/15/2015 132* 150 - 400 K/uL Final  . Sodium 04/15/2015 135  135 - 145 mmol/L Final  . Potassium 04/15/2015 3.2* 3.5 - 5.1 mmol/L Final  . Chloride 04/15/2015 99* 101 - 111 mmol/L Final  . CO2 04/15/2015 29  22 - 32 mmol/L Final  . Glucose, Bld 04/15/2015 123* 65 - 99 mg/dL Final  . BUN 04/15/2015 12  6 - 20 mg/dL Final  . Creatinine, Ser 04/15/2015 0.82  0.61 - 1.24 mg/dL Final  . Calcium 04/15/2015 8.2* 8.9 - 10.3 mg/dL Final  . GFR calc non Af Amer 04/15/2015 >60  >60 mL/min Final  . GFR calc Af Amer 04/15/2015 >60  >60 mL/min Final   Comment: (NOTE) The eGFR has been calculated using the CKD EPI  equation. This calculation has not been validated in all clinical situations. eGFR's persistently <60 mL/min signify possible Chronic Kidney Disease.   . Anion gap 04/15/2015 7  5 - 15 Final  . WBC 04/15/2015 11.9* 4.0 - 10.5 K/uL Final  . RBC 04/15/2015 3.06* 4.22 - 5.81 MIL/uL Final  . Hemoglobin 04/15/2015 9.2* 13.0 - 17.0 g/dL Final  . HCT 04/15/2015 28.5* 39.0 - 52.0 % Final  . MCV 04/15/2015 93.1  78.0 - 100.0 fL Final  . MCH 04/15/2015 30.1  26.0 - 34.0 pg Final  . MCHC 04/15/2015 32.3  30.0 - 36.0 g/dL Final  . RDW 04/15/2015 13.9  11.5 - 15.5 % Final  . Platelets 04/15/2015 143* 150 - 400 K/uL Final  . Neutrophils Relative % 04/15/2015 77  43 - 77 % Final  . Neutro Abs 04/15/2015 9.2* 1.7 - 7.7 K/uL Final  . Lymphocytes Relative 04/15/2015 9* 12 - 46 % Final  . Lymphs Abs 04/15/2015 1.1  0.7 - 4.0 K/uL Final  . Monocytes Relative 04/15/2015 12  3 - 12 % Final  . Monocytes Absolute 04/15/2015 1.4* 0.1 - 1.0 K/uL Final  . Eosinophils Relative 04/15/2015 2  0 - 5 % Final  . Eosinophils Absolute 04/15/2015 0.2  0.0 - 0.7 K/uL Final  . Basophils Relative 04/15/2015 0  0 - 1 % Final  . Basophils Absolute 04/15/2015 0.0  0.0 - 0.1 K/uL Final  . Prothrombin Time 04/16/2015 18.6* 11.6 - 15.2 seconds Final  . INR 04/16/2015 1.55* 0.00 - 1.49 Final  . WBC 04/16/2015 9.2  4.0 - 10.5 K/uL Final  . RBC 04/16/2015 2.84* 4.22 - 5.81 MIL/uL Final  . Hemoglobin 04/16/2015 8.5* 13.0 - 17.0 g/dL Final  . HCT 04/16/2015 26.1* 39.0 - 52.0 % Final  . MCV 04/16/2015 91.9  78.0 - 100.0 fL Final  . MCH 04/16/2015 29.9  26.0 - 34.0 pg Final  . MCHC 04/16/2015 32.6  30.0 - 36.0 g/dL Final  . RDW 04/16/2015 14.0  11.5 - 15.5 % Final  . Platelets 04/16/2015 136* 150 - 400 K/uL Final  . Sodium 04/16/2015 133* 135 - 145 mmol/L Final  . Potassium 04/16/2015 3.4* 3.5 - 5.1 mmol/L Final  . Chloride 04/16/2015 96* 101 - 111 mmol/L Final  . CO2 04/16/2015 28  22 - 32 mmol/L Final  . Glucose, Bld  04/16/2015 111* 65 - 99 mg/dL Final  . BUN 04/16/2015 15  6 - 20 mg/dL Final  . Creatinine, Ser 04/16/2015 0.87  0.61 - 1.24 mg/dL Final  . Calcium 04/16/2015 8.5* 8.9 - 10.3 mg/dL Final  . GFR calc non Af Amer 04/16/2015 >60  >60 mL/min Final  . GFR calc Af Amer 04/16/2015 >60  >60  mL/min Final   Comment: (NOTE) The eGFR has been calculated using the CKD EPI equation. This calculation has not been validated in all clinical situations. eGFR's persistently <60 mL/min signify possible Chronic Kidney Disease.   . Anion gap 04/16/2015 9  5 - 15 Final  . Hemoglobin 04/16/2015 7.9* 13.0 - 17.0 g/dL Final  . HCT 04/16/2015 24.1* 39.0 - 52.0 % Final  . Order Confirmation 04/16/2015 ORDER PROCESSED BY BLOOD BANK   Final  . Prothrombin Time 04/17/2015 17.5* 11.6 - 15.2 seconds Final  . INR 04/17/2015 1.43  0.00 - 1.49 Final  . WBC 04/17/2015 10.0  4.0 - 10.5 K/uL Final  . RBC 04/17/2015 3.35* 4.22 - 5.81 MIL/uL Final  . Hemoglobin 04/17/2015 9.8* 13.0 - 17.0 g/dL Final  . HCT 04/17/2015 30.1* 39.0 - 52.0 % Final  . MCV 04/17/2015 89.9  78.0 - 100.0 fL Final  . MCH 04/17/2015 29.3  26.0 - 34.0 pg Final  . MCHC 04/17/2015 32.6  30.0 - 36.0 g/dL Final  . RDW 04/17/2015 15.4  11.5 - 15.5 % Final  . Platelets 04/17/2015 139* 150 - 400 K/uL Final  Hospital Outpatient Visit on 03/30/2015  Component Date Value Ref Range Status  . aPTT 03/30/2015 35  24 - 37 seconds Final  . MRSA, PCR 03/30/2015 NEGATIVE  NEGATIVE Final  . Staphylococcus aureus 03/30/2015 NEGATIVE  NEGATIVE Final   Comment:        The Xpert SA Assay (FDA approved for NASAL specimens in patients over 4 years of age), is one component of a comprehensive surveillance program.  Test performance has been validated by [facility] for patients greater than or equal to 27 year old. It is not intended to diagnose infection nor to guide or monitor treatment.   . WBC 03/30/2015 5.9  4.0 - 10.5 K/uL Final  . RBC 03/30/2015 4.02* 4.22  - 5.81 MIL/uL Final  . Hemoglobin 03/30/2015 12.2* 13.0 - 17.0 g/dL Final  . HCT 03/30/2015 38.2* 39.0 - 52.0 % Final  . MCV 03/30/2015 95.0  78.0 - 100.0 fL Final  . MCH 03/30/2015 30.3  26.0 - 34.0 pg Final  . MCHC 03/30/2015 31.9  30.0 - 36.0 g/dL Final  . RDW 03/30/2015 13.7  11.5 - 15.5 % Final  . Platelets 03/30/2015 214  150 - 400 K/uL Final  . Neutrophils Relative % 03/30/2015 60  43 - 77 % Final  . Neutro Abs 03/30/2015 3.6  1.7 - 7.7 K/uL Final  . Lymphocytes Relative 03/30/2015 22  12 - 46 % Final  . Lymphs Abs 03/30/2015 1.3  0.7 - 4.0 K/uL Final  . Monocytes Relative 03/30/2015 13* 3 - 12 % Final  . Monocytes Absolute 03/30/2015 0.8  0.1 - 1.0 K/uL Final  . Eosinophils Relative 03/30/2015 4  0 - 5 % Final  . Eosinophils Absolute 03/30/2015 0.2  0.0 - 0.7 K/uL Final  . Basophils Relative 03/30/2015 1  0 - 1 % Final  . Basophils Absolute 03/30/2015 0.0  0.0 - 0.1 K/uL Final  . Sodium 03/30/2015 139  135 - 145 mmol/L Final  . Potassium 03/30/2015 3.7  3.5 - 5.1 mmol/L Final  . Chloride 03/30/2015 102  101 - 111 mmol/L Final  . CO2 03/30/2015 29  22 - 32 mmol/L Final  . Glucose, Bld 03/30/2015 79  65 - 99 mg/dL Final  . BUN 03/30/2015 13  6 - 20 mg/dL Final  . Creatinine, Ser 03/30/2015 0.91  0.61 - 1.24 mg/dL  Final  . Calcium 03/30/2015 9.2  8.9 - 10.3 mg/dL Final  . Total Protein 03/30/2015 7.1  6.5 - 8.1 g/dL Final  . Albumin 03/30/2015 4.0  3.5 - 5.0 g/dL Final  . AST 03/30/2015 23  15 - 41 U/L Final  . ALT 03/30/2015 17  17 - 63 U/L Final  . Alkaline Phosphatase 03/30/2015 66  38 - 126 U/L Final  . Total Bilirubin 03/30/2015 0.9  0.3 - 1.2 mg/dL Final  . GFR calc non Af Amer 03/30/2015 >60  >60 mL/min Final  . GFR calc Af Amer 03/30/2015 >60  >60 mL/min Final   Comment: (NOTE) The eGFR has been calculated using the CKD EPI equation. This calculation has not been validated in all clinical situations. eGFR's persistently <60 mL/min signify possible Chronic  Kidney Disease.   . Anion gap 03/30/2015 8  5 - 15 Final  . Prothrombin Time 03/30/2015 20.5* 11.6 - 15.2 seconds Final  . INR 03/30/2015 1.76* 0.00 - 1.49 Final  . Color, Urine 03/30/2015 YELLOW  YELLOW Final  . APPearance 03/30/2015 CLEAR  CLEAR Final  . Specific Gravity, Urine 03/30/2015 1.010  1.005 - 1.030 Final  . pH 03/30/2015 6.0  5.0 - 8.0 Final  . Glucose, UA 03/30/2015 NEGATIVE  NEGATIVE mg/dL Final  . Hgb urine dipstick 03/30/2015 NEGATIVE  NEGATIVE Final  . Bilirubin Urine 03/30/2015 NEGATIVE  NEGATIVE Final  . Ketones, ur 03/30/2015 NEGATIVE  NEGATIVE mg/dL Final  . Protein, ur 03/30/2015 NEGATIVE  NEGATIVE mg/dL Final  . Urobilinogen, UA 03/30/2015 0.2  0.0 - 1.0 mg/dL Final  . Nitrite 03/30/2015 NEGATIVE  NEGATIVE Final  . Leukocytes, UA 03/30/2015 NEGATIVE  NEGATIVE Final   MICROSCOPIC NOT DONE ON URINES WITH NEGATIVE PROTEIN, BLOOD, LEUKOCYTES, NITRITE, OR GLUCOSE <1000 mg/dL.  Anti-coag visit on 03/24/2015  Component Date Value Ref Range Status  . INR 03/24/2015 1.9   Final  Telephone on 03/22/2015  Component Date Value Ref Range Status  . Sodium 03/23/2015 141  135 - 146 mEq/L Final  . Potassium 03/23/2015 4.3  3.5 - 5.3 mEq/L Final  . Chloride 03/23/2015 104  98 - 110 mEq/L Final  . CO2 03/23/2015 30  20 - 31 mEq/L Final  . Glucose, Bld 03/23/2015 75  65 - 99 mg/dL Final  . BUN 03/23/2015 10  7 - 25 mg/dL Final  . Creat 03/23/2015 0.86  0.70 - 1.18 mg/dL Final  . Calcium 03/23/2015 9.2  8.6 - 10.3 mg/dL Final  . GFR, Est African American 03/23/2015 >89  >=60 mL/min Final  . GFR, Est Non African American 03/23/2015 84  >=60 mL/min Final   Comment:   The estimated GFR is a calculation valid for adults (>=6 years old) that uses the CKD-EPI algorithm to adjust for age and sex. It is   not to be used for children, pregnant women, hospitalized patients,    patients on dialysis, or with rapidly changing kidney function. According to the NKDEP, eGFR >89 is normal,  60-89 shows mild impairment, 30-59 shows moderate impairment, 15-29 shows severe impairment and <15 is ESRD.     Footnotes:  (1) ** Please note change in unit of measure and reference range(s). **     Anti-coag visit on 03/03/2015  Component Date Value Ref Range Status  . INR 03/03/2015 4.2   Final     X-Rays:Dg Chest 2 View  03/30/2015   CLINICAL DATA:  Left knee arthroplasty.  EXAM: CHEST  2 VIEW  COMPARISON:  05/31/2011 .  FINDINGS: Mediastinum hilar structures are normal. Prior aortic valve replacement. Cardiomegaly. No pulmonary venous congestion. No focal pulmonary infiltrate. Basal pleural parenchymal thickening consistent scarring. No prominent pleural effusion or pneumothorax. Degenerative changes thoracic spine  IMPRESSION: 1. Prior aortic valve replacement.  Stable cardiomegaly. 2. No acute pulmonary disease. Basal pleural parenchymal thickening consistent with scarring.   Electronically Signed   By: Marcello Moores  Register   On: 03/30/2015 09:16    EKG: Orders placed or performed in visit on 12/29/14  . EKG 12-Lead     Hospital Course: [name] is a 79 y.o. who was admitted to [facility]. They were brought to the operating room on 04/14/2015 and underwent Procedure(s): LEFT TOTAL KNEE ARTHROPLASTY.  Patient tolerated the procedure well and was later transferred to the recovery room and then to the orthopaedic floor for postoperative care.  They were given PO and IV analgesics for pain control following their surgery.  They were given 24 hours of postoperative antibiotics of  Anti-infectives    Start     Dose/Rate Route Frequency Ordered Stop   04/17/15 0000  sulfamethoxazole-trimethoprim (BACTRIM DS,SEPTRA DS) 800-160 MG per tablet     1 tablet Oral 2 times daily 04/17/15 0902     04/14/15 1500  ceFAZolin (ANCEF) IVPB 1 g/50 mL premix     1 g 100 mL/hr over 30 Minutes Intravenous Every 6 hours 04/14/15 1305 04/14/15 2209   04/14/15 0701  ceFAZolin (ANCEF) IVPB 2  g/50 mL premix     2 g 100 mL/hr over 30 Minutes Intravenous On call to O.R. 04/14/15 0701 04/14/15 0852   04/14/15 0000  polymyxin B 500,000 Units, bacitracin 50,000 Units in sodium chloride irrigation 0.9 % 500 mL irrigation  Status:  Discontinued       As needed 04/14/15 0932 04/14/15 1111     and started on DVT prophylaxis in the form of Coumadin.   PT and OT were ordered for total joint protocol.  Discharge planning consulted to help with postop disposition and equipment needs.  Patient had a fair night on the evening of surgery.  They started to get up OOB with therapy on day one. Hemovac drain was pulled without difficulty.  Continued to work with therapy into day two.  Patient had some urinary retention issues causing need for in and out cath and eventually placement of foley catheter.  Patient was seen in rounds and was ready to go home.   Diet: Cardiac diet Activity:WBAT Follow-up:in 2 weeks Disposition - Home Discharged Condition: stable   Discharge Instructions    Call MD / Call 911    Complete by:  As directed   If you experience chest pain or shortness of breath, CALL 911 and be transported to the hospital emergency room.  If you develope a fever above 101 F, pus (white drainage) or increased drainage or redness at the wound, or calf pain, call your surgeon's office.     Constipation Prevention    Complete by:  As directed   Drink plenty of fluids.  Prune juice may be helpful.  You may use a stool softener, such as Colace (over the counter) 100 mg twice a day.  Use MiraLax (over the counter) for constipation as needed.     Diet - low sodium heart healthy    Complete by:  As directed      Discharge instructions    Complete by:  As directed   [place]  items at home which could result in a fall. This includes throw rugs or furniture in walking pathways ICE to the affected joint every three hours while awake for 30 minutes at a time, for at  least the first 3-5 days, and then as needed for pain and swelling.  Continue to use ice for pain and swelling. You may notice swelling that will progress down to the foot and ankle.  This is normal after surgery.  Elevate your leg when you are not up walking on it.   Continue to use the breathing machine you got in the hospital (incentive spirometer) which will help keep your temperature down.  It is common for your temperature to cycle up and down following surgery, especially at night when you are not up moving around and exerting yourself.  The breathing machine keeps your lungs expanded and your temperature down.   DIET:  As you were doing prior to hospitalization, we recommend a well-balanced diet.  DRESSING / WOUND CARE / SHOWERING  Keep the surgical dressing until follow up.  The dressing is water proof, so you can shower without any extra covering.  IF THE DRESSING FALLS OFF or the wound gets wet inside, change the dressing with sterile gauze.  Please use good hand washing techniques before changing the dressing.  Do not use any lotions or creams on the incision until instructed by your surgeon.    ACTIVITY  Increase activity slowly as tolerated, but follow the weight bearing instructions below.   No driving for 6 weeks or until further direction given by your physician.  You cannot drive while taking narcotics.  No lifting or carrying greater than 10 lbs. until further directed by your surgeon. Avoid periods of inactivity such as sitting longer than an hour when not asleep. This helps prevent blood clots.  You may return to work once you are authorized by your doctor.     WEIGHT BEARING   Weight bearing as tolerated with assist device (walker, cane, etc) as directed, use it as long as suggested by your surgeon or therapist, typically at least 4-6 weeks.   EXERCISES  Results after joint replacement surgery are often greatly improved when you follow the exercise, range of motion  and muscle strengthening exercises prescribed by your doctor. Safety measures are also important to protect the joint from further injury. Any time any of these exercises cause you to have increased pain or swelling, decrease what you are doing until you are comfortable again and then slowly increase them. If you have problems or questions, call your caregiver or physical therapist for advice.   Rehabilitation is important following a joint replacement. After just a few days of immobilization, the muscles of the leg can become weakened and shrink (atrophy).  These exercises are designed to build up the tone and strength of the thigh and leg muscles and to improve motion. Often times heat used for twenty to thirty minutes before working out will loosen up your tissues and help with improving the range of motion but do not use heat for the first two weeks following surgery (sometimes heat can increase post-operative swelling).   These exercises can be done on a training (exercise) mat, on the floor, on a table or on a bed. Use whatever works the best and is most comfortable for you.    Use music or television while you are exercising so that the exercises are a pleasant break in your day. This will make your life  better with the exercises acting as a break in your routine that you can look forward to.   Perform all exercises about fifteen times, three times per day or as directed.  You should exercise both the operative leg and the other leg as well.   Exercises include:   Quad Sets - Tighten up the muscle on the front of the thigh (Quad) and hold for 5-10 seconds.   Straight Leg Raises - With your knee straight (if you were given a brace, keep it on), lift the leg to 60 degrees, hold for 3 seconds, and slowly lower the leg.  Perform this exercise against resistance later as your leg gets stronger.  Leg Slides: Lying on your back, slowly slide your foot toward your buttocks, bending your knee up off the  floor (only go as far as is comfortable). Then slowly slide your foot back down until your leg is flat on the floor again.  Angel Wings: Lying on your back spread your legs to the side as far apart as you can without causing discomfort.  Hamstring Strength:  Lying on your back, push your heel against the floor with your leg straight by tightening up the muscles of your buttocks.  Repeat, but this time bend your knee to a comfortable angle, and push your heel against the floor.  You may put a pillow under the heel to make it more comfortable if necessary.   A rehabilitation program following joint replacement surgery can speed recovery and prevent re-injury in the future due to weakened muscles. Contact your doctor or a physical therapist for more information on knee rehabilitation.    CONSTIPATION  Constipation is defined medically as fewer than three stools per week and severe constipation as less than one stool per week.  Even if you have a regular bowel pattern at home, your normal regimen is likely to be disrupted due to multiple reasons following surgery.  Combination of anesthesia, postoperative narcotics, change in appetite and fluid intake all can affect your bowels.   YOU MUST use at least one of the following options; they are listed in order of increasing strength to get the job done.  They are all available over the counter, and you may need to use some, POSSIBLY even all of these options:    Drink plenty of fluids (prune juice may be helpful) and high fiber foods Colace 100 mg by mouth twice a day  Senokot for constipation as directed and as needed Dulcolax (bisacodyl), take with full glass of water  Miralax (polyethylene glycol) once or twice a day as needed.  If you have tried all these things and are unable to have a bowel movement in the first 3-4 days after surgery call either your surgeon or your primary doctor.    If you experience loose stools or diarrhea, hold the  medications until you stool forms back up.  If your symptoms do not get better within 1 week or if they get worse, check with your doctor.  If you experience "the worst abdominal pain ever" or develop nausea or vomiting, please contact the office immediately for further recommendations for treatment.   ITCHING:  If you experience itching with your medications, try taking only a single pain pill, or even half a pain pill at a time.  You can also use Benadryl over the counter for itching or also to help with sleep.   TED HOSE STOCKINGS:  Use stockings on both legs until for at least  2 weeks or as directed by physician office. They may be removed at night for sleeping.  MEDICATIONS:  See your medication summary on the "After Visit Summary" that nursing will review with you.  You may have some home medications which will be placed on hold until you complete the course of blood thinner medication.  It is important for you to complete the blood thinner medication as prescribed.  PRECAUTIONS:  If you experience chest pain or shortness of breath - call 911 immediately for transfer to the hospital emergency department.   If you develop a fever greater that 101 F, purulent drainage from wound, increased redness or drainage from wound, foul odor from the wound/dressing, or calf pain - CONTACT YOUR SURGEON.                                                   FOLLOW-UP APPOINTMENTS:  If you do not already have a post-op appointment, please call the office for an appointment to be seen by your surgeon.  Guidelines for how soon to be seen are listed in your "After Visit Summary", but are typically between 1-4 weeks after surgery.    MAKE SURE YOU:  Understand these instructions.  Get help right away if you are not doing well or get worse.    Thank you for letting us be a part of your medical care team.  It is a privilege we respect greatly.  We hope these instructions will help you stay on track for a fast and  full recovery!     Increase activity slowly as tolerated    Complete by:  As directed             Medication List    STOP taking these medications        enoxaparin 120 MG/0.8ML injection  Commonly known as:  LOVENOX     naproxen sodium 220 MG tablet  Commonly known as:  ANAPROX     predniSONE 20 MG tablet  Commonly known as:  DELTASONE     traMADol 50 MG tablet  Commonly known as:  ULTRAM      TAKE these medications        allopurinol 300 MG tablet  Commonly known as:  ZYLOPRIM  TAKE ONE TABLET BY MOUTH EVERY DAY     amLODipine 5 MG tablet  Commonly known as:  NORVASC  One tab daily     furosemide 20 MG tablet  Commonly known as:  LASIX  One tablet daily as needed for swelling     methocarbamol 500 MG tablet  Commonly known as:  ROBAXIN  Take 1 tablet (500 mg total) by mouth every 6 (six) hours as needed for muscle spasms.     oxyCODONE-acetaminophen 5-325 MG per tablet  Commonly known as:  PERCOCET/ROXICET  Take 1-2 tablets by mouth every 4 (four) hours as needed for moderate pain.     sulfamethoxazole-trimethoprim 800-160 MG per tablet  Commonly known as:  BACTRIM DS,SEPTRA DS  Take 1 tablet by mouth 2 (two) times daily.     tamsulosin 0.4 MG Caps capsule  Commonly known as:  FLOMAX  Take 0.4 mg by mouth daily.     traZODone 50 MG tablet  Commonly known as:  DESYREL  TAKE 1 TAB AT BEDTIME AS NEEDED (TO REPLACE AMBIEN )  warfarin 5 MG tablet  Commonly known as:  COUMADIN  Take as directed by the coumadin clinic           Follow-up Information    Follow up with [facility].   Why:  home health physical therapy and nurse for INR blood draw and dosage   Contact information:   [address] [place] [address] [phone]       Follow up with [facility].   Why:  3n1 (over the commode seat)   Contact information:   [address] [place] [address] [phone]       Follow up with  GIOFFRE,RONALD A, MD. Schedule an appointment as soon as possible for a visit in 2 weeks.   Specialty:  Orthopedic Surgery   Contact information:   [address] [place] [address] [phone]       Signed: Ardeen Jourdain, PA-C Orthopaedic Surgery 04/27/2015, 3:24 PM

## 2015-04-28 DIAGNOSIS — Z96652 Presence of left artificial knee joint: Secondary | ICD-10-CM | POA: Diagnosis not present

## 2015-04-28 DIAGNOSIS — M109 Gout, unspecified: Secondary | ICD-10-CM | POA: Diagnosis not present

## 2015-04-28 DIAGNOSIS — I10 Essential (primary) hypertension: Secondary | ICD-10-CM | POA: Diagnosis not present

## 2015-04-28 DIAGNOSIS — M1990 Unspecified osteoarthritis, unspecified site: Secondary | ICD-10-CM | POA: Diagnosis not present

## 2015-04-28 DIAGNOSIS — Z792 Long term (current) use of antibiotics: Secondary | ICD-10-CM | POA: Diagnosis not present

## 2015-04-28 DIAGNOSIS — Z7901 Long term (current) use of anticoagulants: Secondary | ICD-10-CM | POA: Diagnosis not present

## 2015-04-28 DIAGNOSIS — D649 Anemia, unspecified: Secondary | ICD-10-CM | POA: Diagnosis not present

## 2015-04-28 DIAGNOSIS — Z952 Presence of prosthetic heart valve: Secondary | ICD-10-CM | POA: Diagnosis not present

## 2015-04-28 DIAGNOSIS — M4806 Spinal stenosis, lumbar region: Secondary | ICD-10-CM | POA: Diagnosis not present

## 2015-04-28 DIAGNOSIS — F419 Anxiety disorder, unspecified: Secondary | ICD-10-CM | POA: Diagnosis not present

## 2015-04-28 DIAGNOSIS — I4891 Unspecified atrial fibrillation: Secondary | ICD-10-CM | POA: Diagnosis not present

## 2015-04-28 DIAGNOSIS — Z471 Aftercare following joint replacement surgery: Secondary | ICD-10-CM | POA: Diagnosis not present

## 2015-04-28 DIAGNOSIS — H8110 Benign paroxysmal vertigo, unspecified ear: Secondary | ICD-10-CM | POA: Diagnosis not present

## 2015-04-30 DIAGNOSIS — I10 Essential (primary) hypertension: Secondary | ICD-10-CM | POA: Diagnosis not present

## 2015-04-30 DIAGNOSIS — M4806 Spinal stenosis, lumbar region: Secondary | ICD-10-CM | POA: Diagnosis not present

## 2015-04-30 DIAGNOSIS — Z471 Aftercare following joint replacement surgery: Secondary | ICD-10-CM | POA: Diagnosis not present

## 2015-04-30 DIAGNOSIS — Z792 Long term (current) use of antibiotics: Secondary | ICD-10-CM | POA: Diagnosis not present

## 2015-04-30 DIAGNOSIS — I4891 Unspecified atrial fibrillation: Secondary | ICD-10-CM | POA: Diagnosis not present

## 2015-04-30 DIAGNOSIS — Z96652 Presence of left artificial knee joint: Secondary | ICD-10-CM | POA: Diagnosis not present

## 2015-04-30 DIAGNOSIS — H8110 Benign paroxysmal vertigo, unspecified ear: Secondary | ICD-10-CM | POA: Diagnosis not present

## 2015-04-30 DIAGNOSIS — F419 Anxiety disorder, unspecified: Secondary | ICD-10-CM | POA: Diagnosis not present

## 2015-04-30 DIAGNOSIS — M1990 Unspecified osteoarthritis, unspecified site: Secondary | ICD-10-CM | POA: Diagnosis not present

## 2015-04-30 DIAGNOSIS — Z7901 Long term (current) use of anticoagulants: Secondary | ICD-10-CM | POA: Diagnosis not present

## 2015-04-30 DIAGNOSIS — M109 Gout, unspecified: Secondary | ICD-10-CM | POA: Diagnosis not present

## 2015-04-30 DIAGNOSIS — Z952 Presence of prosthetic heart valve: Secondary | ICD-10-CM | POA: Diagnosis not present

## 2015-04-30 DIAGNOSIS — D649 Anemia, unspecified: Secondary | ICD-10-CM | POA: Diagnosis not present

## 2015-05-04 ENCOUNTER — Other Ambulatory Visit: Payer: Self-pay | Admitting: General Practice

## 2015-05-04 ENCOUNTER — Other Ambulatory Visit: Payer: Self-pay | Admitting: Family Medicine

## 2015-05-04 MED ORDER — WARFARIN SODIUM 5 MG PO TABS: ORAL_TABLET | ORAL | Status: DC

## 2015-05-10 DIAGNOSIS — M1712 Unilateral primary osteoarthritis, left knee: Secondary | ICD-10-CM | POA: Diagnosis not present

## 2015-05-13 DIAGNOSIS — M1712 Unilateral primary osteoarthritis, left knee: Secondary | ICD-10-CM | POA: Diagnosis not present

## 2015-05-17 DIAGNOSIS — M1712 Unilateral primary osteoarthritis, left knee: Secondary | ICD-10-CM | POA: Diagnosis not present

## 2015-05-19 ENCOUNTER — Ambulatory Visit (INDEPENDENT_AMBULATORY_CARE_PROVIDER_SITE_OTHER): Payer: Medicare Other | Admitting: General Practice

## 2015-05-19 DIAGNOSIS — Z5181 Encounter for therapeutic drug level monitoring: Secondary | ICD-10-CM | POA: Diagnosis not present

## 2015-05-19 DIAGNOSIS — I359 Nonrheumatic aortic valve disorder, unspecified: Secondary | ICD-10-CM | POA: Diagnosis not present

## 2015-05-19 LAB — POCT INR: INR: 2.7

## 2015-05-19 NOTE — Progress Notes (Signed)
Pre visit review using our clinic review tool, if applicable. No additional management support is needed unless otherwise documented below in the visit note. 

## 2015-05-19 NOTE — Progress Notes (Signed)
I have reviewed and agree with the plan. 

## 2015-05-20 DIAGNOSIS — M1712 Unilateral primary osteoarthritis, left knee: Secondary | ICD-10-CM | POA: Diagnosis not present

## 2015-05-24 DIAGNOSIS — M1712 Unilateral primary osteoarthritis, left knee: Secondary | ICD-10-CM | POA: Diagnosis not present

## 2015-05-27 DIAGNOSIS — M1712 Unilateral primary osteoarthritis, left knee: Secondary | ICD-10-CM | POA: Diagnosis not present

## 2015-05-31 DIAGNOSIS — M1712 Unilateral primary osteoarthritis, left knee: Secondary | ICD-10-CM | POA: Diagnosis not present

## 2015-06-03 DIAGNOSIS — M1712 Unilateral primary osteoarthritis, left knee: Secondary | ICD-10-CM | POA: Diagnosis not present

## 2015-06-06 ENCOUNTER — Emergency Department (EMERGENCY_DEPARTMENT_HOSPITAL)
Admit: 2015-06-06 | Discharge: 2015-06-06 | Disposition: A | Discharge status: Home / Self Care | Payer: Medicare Other | Attending: Emergency Medicine | Admitting: Emergency Medicine

## 2015-06-06 ENCOUNTER — Ambulatory Visit (INDEPENDENT_AMBULATORY_CARE_PROVIDER_SITE_OTHER): Payer: Medicare Other | Admitting: Emergency Medicine

## 2015-06-06 ENCOUNTER — Emergency Department (HOSPITAL_COMMUNITY): Payer: Medicare Other

## 2015-06-06 ENCOUNTER — Ambulatory Visit (INDEPENDENT_AMBULATORY_CARE_PROVIDER_SITE_OTHER): Payer: Medicare Other

## 2015-06-06 ENCOUNTER — Encounter (HOSPITAL_COMMUNITY): Payer: Self-pay | Admitting: Emergency Medicine

## 2015-06-06 ENCOUNTER — Emergency Department (HOSPITAL_COMMUNITY)
Admission: EM | Admit: 2015-06-06 | Discharge: 2015-06-06 | Disposition: A | Discharge status: Home / Self Care | Payer: Medicare Other | Attending: Emergency Medicine | Admitting: Emergency Medicine

## 2015-06-06 VITALS — BP 120/60 | HR 86 | Temp 98.7°F | Ht 66.0 in | Wt 172.0 lb

## 2015-06-06 DIAGNOSIS — I481 Persistent atrial fibrillation: Secondary | ICD-10-CM | POA: Diagnosis not present

## 2015-06-06 DIAGNOSIS — Z79899 Other long term (current) drug therapy: Secondary | ICD-10-CM | POA: Insufficient documentation

## 2015-06-06 DIAGNOSIS — J209 Acute bronchitis, unspecified: Secondary | ICD-10-CM | POA: Insufficient documentation

## 2015-06-06 DIAGNOSIS — Z7901 Long term (current) use of anticoagulants: Secondary | ICD-10-CM | POA: Diagnosis not present

## 2015-06-06 DIAGNOSIS — Z87438 Personal history of other diseases of male genital organs: Secondary | ICD-10-CM | POA: Diagnosis not present

## 2015-06-06 DIAGNOSIS — M7989 Other specified soft tissue disorders: Secondary | ICD-10-CM | POA: Diagnosis not present

## 2015-06-06 DIAGNOSIS — I341 Nonrheumatic mitral (valve) prolapse: Secondary | ICD-10-CM | POA: Diagnosis not present

## 2015-06-06 DIAGNOSIS — R0602 Shortness of breath: Secondary | ICD-10-CM | POA: Diagnosis not present

## 2015-06-06 DIAGNOSIS — I4891 Unspecified atrial fibrillation: Secondary | ICD-10-CM | POA: Insufficient documentation

## 2015-06-06 DIAGNOSIS — Z87891 Personal history of nicotine dependence: Secondary | ICD-10-CM | POA: Insufficient documentation

## 2015-06-06 DIAGNOSIS — R05 Cough: Secondary | ICD-10-CM | POA: Diagnosis not present

## 2015-06-06 DIAGNOSIS — R059 Cough, unspecified: Secondary | ICD-10-CM

## 2015-06-06 DIAGNOSIS — J40 Bronchitis, not specified as acute or chronic: Secondary | ICD-10-CM

## 2015-06-06 DIAGNOSIS — M79609 Pain in unspecified limb: Secondary | ICD-10-CM | POA: Diagnosis not present

## 2015-06-06 DIAGNOSIS — M25562 Pain in left knee: Secondary | ICD-10-CM | POA: Insufficient documentation

## 2015-06-06 DIAGNOSIS — I4819 Other persistent atrial fibrillation: Secondary | ICD-10-CM

## 2015-06-06 DIAGNOSIS — Z9889 Other specified postprocedural states: Secondary | ICD-10-CM | POA: Diagnosis not present

## 2015-06-06 DIAGNOSIS — Z872 Personal history of diseases of the skin and subcutaneous tissue: Secondary | ICD-10-CM | POA: Insufficient documentation

## 2015-06-06 DIAGNOSIS — Z8739 Personal history of other diseases of the musculoskeletal system and connective tissue: Secondary | ICD-10-CM | POA: Insufficient documentation

## 2015-06-06 DIAGNOSIS — I10 Essential (primary) hypertension: Secondary | ICD-10-CM | POA: Insufficient documentation

## 2015-06-06 DIAGNOSIS — R600 Localized edema: Secondary | ICD-10-CM | POA: Insufficient documentation

## 2015-06-06 LAB — CBC WITH DIFFERENTIAL/PLATELET
Basophils Absolute: 0 10*3/uL (ref 0.0–0.1)
Basophils Relative: 1 %
Eosinophils Absolute: 0.2 10*3/uL (ref 0.0–0.7)
Eosinophils Relative: 3 %
HCT: 30.6 % — ABNORMAL LOW (ref 39.0–52.0)
Hemoglobin: 9.6 g/dL — ABNORMAL LOW (ref 13.0–17.0)
Lymphocytes Relative: 21 %
Lymphs Abs: 1.2 10*3/uL (ref 0.7–4.0)
MCH: 28.6 pg (ref 26.0–34.0)
MCHC: 31.4 g/dL (ref 30.0–36.0)
MCV: 91.1 fL (ref 78.0–100.0)
Monocytes Absolute: 0.7 10*3/uL (ref 0.1–1.0)
Monocytes Relative: 12 %
Neutro Abs: 3.6 10*3/uL (ref 1.7–7.7)
Neutrophils Relative %: 63 %
Platelets: 278 10*3/uL (ref 150–400)
RBC: 3.36 MIL/uL — ABNORMAL LOW (ref 4.22–5.81)
RDW: 15.8 % — ABNORMAL HIGH (ref 11.5–15.5)
WBC: 5.6 10*3/uL (ref 4.0–10.5)

## 2015-06-06 LAB — BASIC METABOLIC PANEL
Anion gap: 8 (ref 5–15)
BUN: 14 mg/dL (ref 6–20)
CO2: 28 mmol/L (ref 22–32)
Calcium: 8.8 mg/dL — ABNORMAL LOW (ref 8.9–10.3)
Chloride: 104 mmol/L (ref 101–111)
Creatinine, Ser: 0.82 mg/dL (ref 0.61–1.24)
GFR calc Af Amer: >60 mL/min (ref >60)
GFR calc non Af Amer: >60 mL/min (ref >60)
Glucose, Bld: 81 mg/dL (ref 65–99)
Potassium: 3.6 mmol/L (ref 3.5–5.1)
Sodium: 140 mmol/L (ref 135–145)

## 2015-06-06 LAB — POCT CBC
Granulocyte percent: 70.9 %G (ref 37–80)
HCT, POC: 29.6 % — AB (ref 43.5–53.7)
Hemoglobin: 9 g/dL — AB (ref 14.1–18.1)
Lymph, poc: 0.9 (ref 0.6–3.4)
MCH, POC: 26.9 pg — AB (ref 27–31.2)
MCHC: 30.4 g/dL — AB (ref 31.8–35.4)
MCV: 88.4 fL (ref 80–97)
MID (cbc): 0.5 (ref 0–0.9)
MPV: 5.8 fL (ref 0–99.8)
POC Granulocyte: 3.6 (ref 2–6.9)
POC LYMPH PERCENT: 18.6 %L (ref 10–50)
POC MID %: 10.5 %M (ref 0–12)
Platelet Count, POC: 328 10*3/uL (ref 142–424)
RBC: 3.34 M/uL — AB (ref 4.69–6.13)
RDW, POC: 18 %
WBC: 5.1 10*3/uL (ref 4.6–10.2)

## 2015-06-06 LAB — PROTIME-INR
INR: 2.25 — ABNORMAL HIGH (ref 0.00–1.49)
Prothrombin Time: 24.7 seconds — ABNORMAL HIGH (ref 11.6–15.2)

## 2015-06-06 MED ORDER — PROMETHAZINE-DM 6.25-15 MG/5ML PO SYRP: 5.0000 mL | ORAL_SOLUTION | Freq: Four times a day (QID) | ORAL | Status: DC | PRN

## 2015-06-06 MED ORDER — AEROCHAMBER Z-STAT PLUS/MEDIUM MISC
1.0000 | Freq: Once | Status: AC
  Administered 2015-06-06: 1
  Filled 2015-06-06: qty 1

## 2015-06-06 MED ORDER — GUAIFENESIN ER 1200 MG PO TB12: 1.0000 | ORAL_TABLET | Freq: Two times a day (BID) | ORAL | Status: DC

## 2015-06-06 MED ORDER — PREDNISONE 50 MG PO TABS: 50.0000 mg | ORAL_TABLET | Freq: Every day | ORAL | Status: DC

## 2015-06-06 MED ORDER — ALBUTEROL SULFATE HFA 108 (90 BASE) MCG/ACT IN AERS
2.0000 | INHALATION_SPRAY | RESPIRATORY_TRACT | Status: DC | PRN
  Administered 2015-06-06: 2 via RESPIRATORY_TRACT
  Filled 2015-06-06: qty 6.7

## 2015-06-06 MED ORDER — DOXYCYCLINE HYCLATE 100 MG PO CAPS: 100.0000 mg | ORAL_CAPSULE | Freq: Two times a day (BID) | ORAL | Status: DC

## 2015-06-06 NOTE — ED Notes (Signed)
Bed: WA20 Expected date:  Expected time:  Means of arrival:  Comments: Diff breathing from Drs office

## 2015-06-06 NOTE — ED Provider Notes (Signed)
  Face-to-face evaluation   History: He complains of several days of cough, productive of yellow sputum, and cold fingers bilaterally. At night he wakes up choking on lung secretions. He is short of breath when coughing. No history of COPD., Or asthma. Recent surgery with blood loss, requiring transfusion.  Physical exam: Alert, calm, cooperative. Oxygen saturation 96% on room air at this time. Lungs with scattered rhonchi and wheezes. Left knee is swollen,  consistent with recent knee, replacement surgery. There is also left lower extremity edema.  Medical screening examination/treatment/procedure(s) were conducted as a shared visit with non-physician practitioner(s) and myself.  I personally evaluated the patient during the encounter  Daleen Bo, MD 06/14/15 2106

## 2015-06-06 NOTE — ED Provider Notes (Signed)
CSN: [id number]     Arrival date & time 06/06/15  1342 History   First MD Initiated Contact with Patient 06/06/15 1353     Chief Complaint  Patient presents with  . Shortness of Breath  . Knee Pain     (Consider location/radiation/quality/duration/timing/severity/associated sxs/prior Treatment) HPI Patient presents to the emergency department with cough and shortness of breath over the last week.  The patient states that he had knee replacement almost 8 weeks ago and had lower leg swelling since that time.  Patient has had no chest pain, nausea, vomiting, weakness, dizziness, headache, blurred vision, back pain, neck pain, fever, runny nose, sore throat, numbness, lightheadedness or syncope.  Patient states he took over-the-counter cough and cold medications seemed to help his symptoms somewhat better. Past Medical History  Diagnosis Date  . HTN (hypertension)   . Atrial fibrillation ([place])   . Aortic stenosis   . Erectile dysfunction   . Gout   . MVP (mitral valve prolapse)   . Baker cyst     Left knee  . Eczema   . DJD (degenerative joint disease)     knees  . Shortness of breath dyspnea     with activity   Past Surgical History  Procedure Laterality Date  . Aortic valve replacement  ~2004  . Right total hip  ~4 years ago  .  polyps vocal cord  1994  . Rotator cuff repair Left ~2009  . Lumbar laminectomy/decompression microdiscectomy N/A 01/15/2015    Procedure: 2 LEVEL DECOMPRESSIVE LUMBAR LAMINECTOMY L3-L4,L4-L5;  Surgeon: Latanya Maudlin, MD;  Location: [facility];  Service: Orthopedics;  Laterality: N/A;  . Hernia repair  ~1978  . Hernia repair  [phone]  . Total knee arthroplasty Left 04/14/2015    Procedure: LEFT TOTAL KNEE ARTHROPLASTY;  Surgeon: Latanya Maudlin, MD;  Location: [facility];  Service: Orthopedics;  Laterality: Left;   Family History  Problem Relation Age of Onset  . Diabetes    . Stroke     Social History  Substance Use Topics  . Smoking status: Former Smoker  -- 30 years    Types: Cigarettes    Quit date: 08/28/2002  . Smokeless tobacco: Never Used  . Alcohol Use: Yes     Comment: social    Review of Systems  All other systems negative except as documented in the HPI. All pertinent positives and negatives as reviewed in the HPI.  Allergies  Review of patient's allergies indicates no known allergies.  Home Medications   Prior to Admission medications   Medication Sig Start Date End Date Taking? Authorizing Provider  allopurinol (ZYLOPRIM) 300 MG tablet TAKE ONE TABLET BY MOUTH EVERY DAY 06/10/14  Yes Dorena Cookey, MD  amLODipine (NORVASC) 5 MG tablet One tab daily Patient taking differently: Take 5 mg by mouth daily. One tab daily 06/10/14  Yes Dorena Cookey, MD  furosemide (LASIX) 20 MG tablet One tablet daily as needed for swelling 03/22/15  Yes Lelon Perla, MD  tamsulosin (FLOMAX) 0.4 MG CAPS capsule Take 0.4 mg by mouth daily after breakfast.    Yes Historical Provider, MD  traZODone (DESYREL) 50 MG tablet TAKE 1 TAB AT BEDTIME AS NEEDED (TO REPLACE AMBIEN ) 06/10/14  Yes Dorena Cookey, MD  warfarin (COUMADIN) 5 MG tablet Take as directed by the coumadin clinic Patient taking differently: Take 2.5-5 mg by mouth every morning. Take as directed by the coumadin clinic  Monday Wednesday Friday 5 mg every other day take  2.5 mg 05/04/15  Yes Dorena Cookey, MD  methocarbamol (ROBAXIN) 500 MG tablet Take 1 tablet (500 mg total) by mouth every 6 (six) hours as needed for muscle spasms. Patient not taking: Reported on 06/06/2015 04/16/15   Ardeen Jourdain, PA-C  oxyCODONE-acetaminophen (PERCOCET/ROXICET) 5-325 MG per tablet Take 1-2 tablets by mouth every 4 (four) hours as needed for moderate pain. Patient not taking: Reported on 06/06/2015 04/16/15   Amber Constable, PA-C   BP 150/68 mmHg  Pulse 99  Temp(Src) 97.9 F (36.6 C) (Oral)  Resp 15  SpO2 95% Physical Exam  Constitutional: He is oriented to person, place, and time. He appears  well-developed and well-nourished. No distress.  HENT:  Head: Normocephalic and atraumatic.  Mouth/Throat: Oropharynx is clear and moist.  Eyes: Pupils are equal, round, and reactive to light.  Neck: Normal range of motion. Neck supple.  Cardiovascular: Normal rate, regular rhythm and normal heart sounds.  Exam reveals no gallop and no friction rub.   No murmur heard. Pulmonary/Chest: Effort normal and breath sounds normal. No respiratory distress. He has no wheezes.  Patient has some mild crackles in the bases of his lungs  Abdominal: Soft. Bowel sounds are normal. He exhibits no distension. There is no tenderness.  Musculoskeletal: He exhibits edema.  Neurological: He is alert and oriented to person, place, and time. He exhibits normal muscle tone. Coordination normal.  Skin: Skin is warm and dry. No rash noted. No erythema.  Psychiatric: He has a normal mood and affect. His behavior is normal.  Nursing note and vitals reviewed.   ED Course  Procedures (including critical care time) Labs Review Labs Reviewed  BASIC METABOLIC PANEL  CBC WITH DIFFERENTIAL/PLATELET  PROTIME-INR    Imaging Review Dg Chest 2 View  06/06/2015   CLINICAL DATA:  79 year old with acute onset of cough and shortness of breath. Prior aortic valve replacement.  EXAM: CHEST  2 VIEW  COMPARISON:  03/30/2015 and earlier.  FINDINGS: Sternotomy with aortic valve replacement. Cardiac silhouette moderately enlarged, unchanged. Thoracic aorta atherosclerotic, unchanged. Hilar and mediastinal contours otherwise unremarkable. Streaky and patchy airspace opacities in the lower lobes. Prominent bronchovascular markings diffusely and moderate to marked central peribronchial thickening, more so than on the most recent prior examination. Emphysematous changes and hyperinflation, unchanged. Stable chronic elevation of the left posterior hemidiaphragm. Degenerative changes and DISH involving the thoracic spine.  IMPRESSION: 1.  Bilateral lower lobe atelectasis and/or bronchopneumonia superimposed upon acute bronchitis and/or asthma in this patient with baseline emphysema. 2. Stable cardiomegaly without evidence of pulmonary edema.   Electronically Signed   By: Evangeline Dakin M.D.   On: 06/06/2015 13:21   I have personally reviewed and evaluated these images and lab results as part of my medical decision-making.   EKG Interpretation   Date/Time:  Sunday June 06 2015 13:53:55 EDT Ventricular Rate:  97 PR Interval:    QRS Duration: 112 QT Interval:  399 QTC Calculation: 507 R Axis:   76 Text Interpretation:  Atrial fibrillation Anteroseptal infarct, age  indeterminate Prolonged QT interval Since last tracing Atrial fibrillation  is new and  QT has lengthened Confirmed by Eulis Foster  MD, ELLIOTT [phone]) on  06/06/2015 2:02:56 PM        Patient most likely has bronchitis.  His O2 sats remained in the upper 90s while here in the emergency department and be treated with doxycycline, steroids and inhaler.  Told to return here as needed.  Told to follow with his primary care doctor.  He has no DVT noted on lower extremity venous duplex  Dalia Heading, PA-C 06/08/15 0024  Daleen Bo, MD 06/14/15 2107

## 2015-06-06 NOTE — Progress Notes (Signed)
This chart was scribed for Arlyss Queen, MD by Leandra Kern, Medical Scribe. This patient was seen in Room 3 and the patient's care was started at 12:09 PM.   Chief Complaint:  Chief Complaint  Patient presents with  . Nasal Congestion     and chest congestion x 6 days  . Cough    thick white product with cough  . Shortness of Breath    x 1 day - awoke last pm with sob    HPI: [name] is a 79 y.o. male with medical history of HTN, A-fib, and aortic valve disorder who reports to [facility] today complaining of chest congestion, onset 6 days ago.  Pt reports symptoms of nasal congestion, productive cough of initial yellow discharge the turned into white color now, in addition to shortness of breath, first episode today after waking up in the morning. He also states that he experiences shortness of breath with minimal exertion such as walking for a short period of time. Pt indicates that he does not use albuterol inhaler, and denies being a smoker. Pt denies symptoms of chest pain, orthopnea . Pt had a knee replacement surgery 6 weeks ago. He is compliant with taking Coumadin.  Pt's cardiologist is Dr. Deloris Ping.   Pt's home country is [place].    Past Medical History  Diagnosis Date  . HTN (hypertension)   . Atrial fibrillation ([place])   . Aortic stenosis   . Erectile dysfunction   . Gout   . MVP (mitral valve prolapse)   . Baker cyst     Left knee  . Eczema   . DJD (degenerative joint disease)     knees  . Shortness of breath dyspnea     with activity   Past Surgical History  Procedure Laterality Date  . Aortic valve replacement  ~2004  . Right total hip  ~4 years ago  .  polyps vocal cord  1994  . Rotator cuff repair Left ~2009  . Lumbar laminectomy/decompression microdiscectomy N/A 01/15/2015    Procedure: 2 LEVEL DECOMPRESSIVE LUMBAR LAMINECTOMY L3-L4,L4-L5;  Surgeon: Latanya Maudlin, MD;  Location: [facility];  Service: Orthopedics;  Laterality: N/A;  . Hernia  repair  ~1978  . Hernia repair  [phone]  . Total knee arthroplasty Left 04/14/2015    Procedure: LEFT TOTAL KNEE ARTHROPLASTY;  Surgeon: Latanya Maudlin, MD;  Location: [facility];  Service: Orthopedics;  Laterality: Left;   Social History   Social History  . Marital Status: Divorced    Spouse Name: N/A  . Number of Children: N/A  . Years of Education: N/A   Social History Main Topics  . Smoking status: Former Smoker -- 30 years    Types: Cigarettes    Quit date: 08/28/2002  . Smokeless tobacco: Never Used  . Alcohol Use: Yes     Comment: social  . Drug Use: No  . Sexual Activity: Not Asked   Other Topics Concern  . None   Social History Narrative   Family History  Problem Relation Age of Onset  . Diabetes    . Stroke     No Known Allergies Prior to Admission medications   Medication Sig Start Date End Date Taking? Authorizing Provider  allopurinol (ZYLOPRIM) 300 MG tablet TAKE ONE TABLET BY MOUTH EVERY DAY 06/10/14  Yes Dorena Cookey, MD  amLODipine (NORVASC) 5 MG tablet One tab daily Patient taking differently: Take 5 mg by mouth daily. One tab daily 06/10/14  Yes  Dorena Cookey, MD  furosemide (LASIX) 20 MG tablet One tablet daily as needed for swelling 03/22/15  Yes Lelon Perla, MD  tamsulosin (FLOMAX) 0.4 MG CAPS capsule Take 0.4 mg by mouth daily.    Yes Historical Provider, MD  traZODone (DESYREL) 50 MG tablet TAKE 1 TAB AT BEDTIME AS NEEDED (TO REPLACE AMBIEN ) 06/10/14  Yes Dorena Cookey, MD  warfarin (COUMADIN) 5 MG tablet Take as directed by the coumadin clinic 05/04/15  Yes Dorena Cookey, MD  methocarbamol (ROBAXIN) 500 MG tablet Take 1 tablet (500 mg total) by mouth every 6 (six) hours as needed for muscle spasms. Patient not taking: Reported on 06/06/2015 04/16/15   Ardeen Jourdain, PA-C  oxyCODONE-acetaminophen (PERCOCET/ROXICET) 5-325 MG per tablet Take 1-2 tablets by mouth every 4 (four) hours as needed for moderate pain. Patient not taking: Reported on  06/06/2015 04/16/15   Ardeen Jourdain, PA-C     ROS: The patient has congestion, cough, shortness of breath.  Pt denies chest pain, orthopnea.   All other systems have been reviewed and were otherwise negative with the exception of those mentioned in the HPI and as above.    PHYSICAL EXAM: Filed Vitals:   06/06/15 1201  BP: 120/60  Pulse: 86  Temp: 98.7 F (37.1 C)   Body mass index is 27.77 kg/(m^2).   General: Alert, no acute distress. Raspy voice. HEENT:  Normocephalic, atraumatic, oropharynx patent. Eye: Juliette Mangle [facility] Cardiovascular:  Regular rate and rhythm, no rubs murmurs or gallops.  No Carotid bruits, radial pulse intact. No pedal edema.  Respiratory: Rales present in the lower lobes of both lungs, no dullness, no wheezing. No cyanosis. Abdominal: No organomegaly, abdomen is soft and non-tender, positive bowel sounds.  No masses. Musculoskeletal: Gait intact. No edema, tenderness Skin: No rashes. Neurologic: Facial musculature symmetric. Psychiatric: Patient acts appropriately throughout our interaction. Lymphatic: No cervical or submandibular lymphadenopathy  A raspy voice   LABS:   Results for orders placed or performed in visit on 06/06/15  POCT CBC  Result Value Ref Range   WBC 5.1 4.6 - 10.2 K/uL   Lymph, poc 0.9 0.6 - 3.4   POC LYMPH PERCENT 18.6 10 - 50 %L   MID (cbc) 0.5 0 - 0.9   POC MID % 10.5 0 - 12 %M   POC Granulocyte 3.6 2 - 6.9   Granulocyte percent 70.9 37 - 80 %G   RBC 3.34 (A) 4.69 - 6.13 M/uL   Hemoglobin 9.0 (A) 14.1 - 18.1 g/dL   HCT, POC 29.6 (A) 43.5 - 53.7 %   MCV 88.4 80 - 97 fL   MCH, POC 26.9 (A) 27 - 31.2 pg   MCHC 30.4 (A) 31.8 - 35.4 g/dL   RDW, POC 18 %   Platelet Count, POC 328 142 - 424 K/uL   MPV 5.8 0 - 99.8 fL  . Findings were   EKG/XRAY:   Primary read interpreted by Dr. Everlene Farrier at [facility]. Patient has cardiomegaly. There is basilar infiltrates described as scarring on previous chest x-ray. There may be slight  progression.   ASSESSMENT/PLAN: Hemoglobin is down to 9. There are chronic infiltrates in the bases but they may be somewhat worse. It is possible he has slept in the failure due to his atrial fibrillation with rapid rate and anemia. Patient started on O2 and sent to the hospital for further evaluation. He does have swelling of the left calf certainly a d-dimer would be reasonable.I personally performed the  services described in this documentation, which was scribed in my presence. The recorded information has been reviewed and is accurate.  By signing my name below, I, Rawaa Al Rifaie, attest that this documentation has been prepared under the direction and in the presence of Arlyss Queen, MD.  Leandra Kern, Medical Scribe. 06/06/2015.  12:21 PM.     Gross sideeffects, risk and benefits, and alternatives of medications d/w patient. Patient is aware that all medications have potential sideeffects and we are unable to predict every sideeffect or drug-drug interaction that may occur.  Arlyss Queen MD 06/06/2015 12:08 PM

## 2015-06-06 NOTE — Progress Notes (Addendum)
*  Preliminary Results* Left lower extremity venous duplex completed. Left lower extremity is negative for deep vein thrombosis. There is no evidence of left Baker's cyst.  Incidental finding: There is evidence of a 3cm heterogenous area of the left groin, suggestive of a possible enlarged inguinal lymph node.  06/06/2015 3:21 PM  Maudry Mayhew, RVT, RDCS, RDMS

## 2015-06-06 NOTE — ED Notes (Addendum)
Coming from urgent care. Had lt knee replacement 3 weeks ago, with low Hgb of 9. Had to receive 2 units of blood. Has had sob over a week gradually getting worse. Took cough meds OTC with minimal relief. Denies any fever. Is on blood thinners with a hx of afib. Alert x4. At urgent care had chest x ray showing possible pneumonia.

## 2015-06-06 NOTE — Discharge Instructions (Signed)
Return here as needed.  Follow-up with your primary care doctor.  Rest as much possible and increase your fluid intake

## 2015-06-07 ENCOUNTER — Telehealth: Payer: Self-pay | Admitting: Emergency Medicine

## 2015-06-07 DIAGNOSIS — M1712 Unilateral primary osteoarthritis, left knee: Secondary | ICD-10-CM | POA: Diagnosis not present

## 2015-06-07 LAB — BRAIN NATRIURETIC PEPTIDE: Brain Natriuretic Peptide: 374.9 pg/mL — ABNORMAL HIGH (ref 0.0–100.0)

## 2015-06-07 NOTE — Telephone Encounter (Signed)
Spoke with patient.  Discussed with him that his BNP was elevated and his symptoms may be related to heart failure.  He has an appt with Dr Stanford Breed next week but I told the patient we are going to call and let Dr Dawayne Patricia decided whether he needs to be seen sooner.

## 2015-06-07 NOTE — Telephone Encounter (Signed)
Pt called stating he missed a call after his physical with Dr. Everlene Farrier.  Please contact to clarify the missed call.  [phone]

## 2015-06-08 ENCOUNTER — Telehealth: Payer: Self-pay | Admitting: Cardiology

## 2015-06-08 ENCOUNTER — Other Ambulatory Visit: Payer: Self-pay | Admitting: Emergency Medicine

## 2015-06-08 DIAGNOSIS — R7989 Other specified abnormal findings of blood chemistry: Secondary | ICD-10-CM

## 2015-06-08 NOTE — Telephone Encounter (Signed)
Call patient and see if Dr. Jacalyn Lefevre office gave him a sooner appointment. They were supposed to get back with me. You can also check with patient and see if he has heard from their office.

## 2015-06-08 NOTE — Telephone Encounter (Signed)
Returned call to Dr. Everlene Farrier. He saw this patient Sunday in urgent care setting. Pt seen for c/o SOB, cough & congestion. Sent to ER due to elevated BNP (374). Pt discharged home w/ antibiotics.  Notes he followed up w/ phone call to patient, wanted to see if we could see this patient his week - requested further f/u from [place] regarding elevated BNP Informed him would defer to Dr. Stanford Breed for recommendations - could likely see for extender visit, will see if any further recommendations.

## 2015-06-08 NOTE — Telephone Encounter (Signed)
Called patient - he reports feeling OK. Has appt w/ Dr. Stanford Breed on 31st - he feels fine to wait to be seen then - does he need to be seen sooner?

## 2015-06-08 NOTE — Telephone Encounter (Signed)
PATIENT WOULD LIKE DR. DAUB TO KNOW THAT DR. CRENSHAW'S OFFICE CALLED HIM Tuesday AND RESCHEDULED HIS APPOINTMENT FOR THURS. OCT. 13, 2016 AT 9:00 A.M. [name] ALSO HAS SOME QUESTIONS ABOUT HIS LAB RESULTS WE DID WHEN HE WAS HERE. BEST PHONE [phone] (CELL) [name]

## 2015-06-08 NOTE — Telephone Encounter (Signed)
Fu as scheduled [name]

## 2015-06-08 NOTE — Telephone Encounter (Signed)
I have called Dr Stanford Breed. Patient has appt on Oct 31st but he needs sooner to to increased SOB and elevated BNP / there is an opening on 06/15/15, I have declined this and asked if there is anything sooner, Rollene Fare will discuss with the physician staff and call you with an appt this week, on your cell, If you do not hear anything by the end of the day, I can call back

## 2015-06-08 NOTE — Telephone Encounter (Signed)
I will contact Dr. Jacalyn Lefevre office today to see if we can get patient in quickly.

## 2015-06-08 NOTE — Telephone Encounter (Signed)
[name] called in stating that Dr. Everlene Farrier wanted this pt to be seen this week if possible. The pt was in his office today with an elevated BMP and SOB. Please f/u with Dr. Everlene Farrier  Thanks

## 2015-06-08 NOTE — Telephone Encounter (Signed)
Called patient back. Advised f/u as scheduled is fine - call if new problems. Pt amenable to plan.

## 2015-06-10 ENCOUNTER — Ambulatory Visit (INDEPENDENT_AMBULATORY_CARE_PROVIDER_SITE_OTHER): Payer: Medicare Other | Admitting: Cardiology

## 2015-06-10 ENCOUNTER — Encounter: Payer: Self-pay | Admitting: Cardiology

## 2015-06-10 VITALS — BP 124/66 | HR 86 | Ht 67.0 in | Wt 166.1 lb

## 2015-06-10 DIAGNOSIS — I10 Essential (primary) hypertension: Secondary | ICD-10-CM | POA: Diagnosis not present

## 2015-06-10 DIAGNOSIS — R0600 Dyspnea, unspecified: Secondary | ICD-10-CM | POA: Diagnosis not present

## 2015-06-10 DIAGNOSIS — I482 Chronic atrial fibrillation, unspecified: Secondary | ICD-10-CM

## 2015-06-10 DIAGNOSIS — M1712 Unilateral primary osteoarthritis, left knee: Secondary | ICD-10-CM | POA: Diagnosis not present

## 2015-06-10 DIAGNOSIS — I359 Nonrheumatic aortic valve disorder, unspecified: Secondary | ICD-10-CM

## 2015-06-10 NOTE — Progress Notes (Signed)
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 Preoperative cardiac catheterization revealed normal coronary arteries. He has permanent atrial fibrillation. Previous carotid Dopplers due to bruits revealed 0-39% bilateral stenosis. A monitor in August of 2011 showed occasional 3.1 second pause. Tenoretic was discontinued and Norvasc added for his blood pressure. CTA in April of 2014 showed atheromatous thoracic aorta without aneurysm, dissection or stenosis. Echocardiogram May 2016 showed vigorous LV function. There was a prosthetic aortic valve with mean gradient 15 mmHg. There was mild mitral regurgitation, moderate left atrial enlargement, mild right ventricular enlargement, moderate right atrial enlargement and mild tricuspid regurgitation. Chest x-ray October 2016 showed acute bronchitis and baseline emphysema. No pulmonary edema. BNP 375. Since I last saw him, Patient developed dyspnea last Saturday that he felt was severe. There was an associated productive cough of yellowish sputum. No fevers, chills or hemoptysis. No chest pain, palpitations or syncope. He had mild edema in the left lower extremity from recent knee surgery. He was seen and lower extremity Doppler showed no DVT. Laboratories showed hemoglobin 9.6. Patient was started on antibiotics and his dyspnea is improving. He is also on Lasix 20 mg daily.  Current Outpatient Prescriptions  Medication Sig Dispense Refill  . allopurinol (ZYLOPRIM) 300 MG tablet TAKE ONE TABLET BY MOUTH EVERY DAY 100 tablet 3  . amLODipine (NORVASC) 5 MG tablet One tab daily (Patient taking differently: Take 5 mg by mouth daily. One tab daily) 90 tablet 3  . doxycycline (VIBRAMYCIN) 100 MG capsule Take 1 capsule (100 mg total) by mouth 2 (two) times daily. 20 capsule 0  . furosemide (LASIX) 20 MG tablet One tablet daily as needed for swelling 30 tablet 6  .  Guaifenesin 1200 MG TB12 Take 1 tablet (1,200 mg total) by mouth 2 (two) times daily. 20 each 0  . promethazine-dextromethorphan (PROMETHAZINE-DM) 6.25-15 MG/5ML syrup Take 5 mLs by mouth 4 (four) times daily as needed for cough. 120 mL 0  . tamsulosin (FLOMAX) 0.4 MG CAPS capsule Take 0.4 mg by mouth daily after breakfast.     . traZODone (DESYREL) 50 MG tablet TAKE 1 TAB AT BEDTIME AS NEEDED (TO REPLACE AMBIEN ) 90 tablet 3  . warfarin (COUMADIN) 5 MG tablet Take as directed by the coumadin clinic (Patient taking differently: Take 2.5-5 mg by mouth every morning. Take as directed by the coumadin clinic  Monday Wednesday Friday 5 mg every other day take 2.5 mg) 90 tablet 1   No current facility-administered medications for this visit.     Past Medical History  Diagnosis Date  . HTN (hypertension)   . Atrial fibrillation ([place])   . Aortic stenosis   . Erectile dysfunction   . Gout   . MVP (mitral valve prolapse)   . Baker cyst     Left knee  . Eczema   . DJD (degenerative joint disease)     knees  . Shortness of breath dyspnea     with activity    Past Surgical History  Procedure Laterality Date  . Aortic valve replacement  ~2004  . Right total hip  ~4 years ago  .  polyps vocal cord  1994  . Rotator cuff repair Left ~2009  . Lumbar laminectomy/decompression microdiscectomy N/A 01/15/2015    Procedure: 2 LEVEL DECOMPRESSIVE LUMBAR LAMINECTOMY L3-L4,L4-L5;  Surgeon: Latanya Maudlin, MD;  Location: [facility];  Service: Orthopedics;  Laterality: N/A;  .  Hernia repair  [phone]  . Hernia repair  [phone]  . Total knee arthroplasty Left 04/14/2015    Procedure: LEFT TOTAL KNEE ARTHROPLASTY;  Surgeon: Latanya Maudlin, MD;  Location: [facility];  Service: Orthopedics;  Laterality: Left;    Social History   Social History  . Marital Status: Divorced    Spouse Name: N/A  . Number of Children: N/A  . Years of Education: N/A   Occupational History  . Not on file.   Social History Main Topics    . Smoking status: Former Smoker -- 30 years    Types: Cigarettes    Quit date: 08/28/2002  . Smokeless tobacco: Never Used  . Alcohol Use: Yes     Comment: social  . Drug Use: No  . Sexual Activity: Not on file   Other Topics Concern  . Not on file   Social History Narrative    ROS: no fevers or chills, productive cough, hemoptysis, dysphasia, odynophagia, melena, hematochezia, dysuria, hematuria, rash, seizure activity, orthopnea, PND, pedal edema, claudication. Remaining systems are negative.  Physical Exam: Well-developed well-nourished in no acute distress.  Skin is warm and dry.  HEENT is normal.  Neck is supple.  Chest is clear to auscultation with normal expansion.  Cardiovascular exam is irregular Abdominal exam nontender or distended. No masses palpated. Extremities show no edema. neuro grossly intact  ECG 06/06/2015-atrial fibrillation with PVCs or aberrantly conducted beats, cannot rule out prior septal infarct, RV conduction delay.

## 2015-06-10 NOTE — Telephone Encounter (Signed)
Left message for him to call back so we can answer his lab questions

## 2015-06-10 NOTE — Assessment & Plan Note (Signed)
Patient with recent complaints of dyspnea. There was an associated productive cough. Dyspnea is improving with antibiotics. His BNP was mildly elevated. This is likely multifactorial including mild diastolic congestive heart failure, bronchitis and possible contribution from anemia. Continue antibiotics and low-dose diuretic. He is not volume overloaded on examination. Recent echocardiogram showed normal LV function and normally functioning aortic valve. Will arrange 24-hour Holter to make sure that rate is adequately controlled and not contributing to dyspnea.

## 2015-06-10 NOTE — Assessment & Plan Note (Signed)
Blood pressure controlled. Continue present medications. 

## 2015-06-10 NOTE — Telephone Encounter (Signed)
Patient will see Dr Stanford Breed today.  I will call him later and answer his questions about the labs

## 2015-06-10 NOTE — Assessment & Plan Note (Signed)
Patient is status post previous aortic valve replacement. Continue SBE prophylaxis.

## 2015-06-10 NOTE — Patient Instructions (Signed)
Your physician has recommended that you wear a holter monitor for 24 hour holter @ [address]. Holter monitors are medical devices that record the heart's electrical activity. Doctors most often use these monitors to diagnose arrhythmias. Arrhythmias are problems with the speed or rhythm of the heartbeat. The monitor is a small, portable device. You can wear one while you do your normal daily activities. This is usually used to diagnose what is causing palpitations/syncope (passing out).  Your physician recommends that you schedule a follow-up appointment in: 6 weeks with Dr. Stanford Breed

## 2015-06-10 NOTE — Assessment & Plan Note (Signed)
Patient remains in permanent atrial fibrillation. Continue Coumadin. Check 24-hour Holter monitor to make sure that rate is controlled.

## 2015-06-11 NOTE — Progress Notes (Signed)
Patient was sent to the ER and had a venous Doppler instead of a D-dimer test

## 2015-06-14 ENCOUNTER — Other Ambulatory Visit: Payer: Self-pay | Admitting: Family Medicine

## 2015-06-15 ENCOUNTER — Ambulatory Visit (INDEPENDENT_AMBULATORY_CARE_PROVIDER_SITE_OTHER): Payer: Medicare Other

## 2015-06-15 DIAGNOSIS — I482 Chronic atrial fibrillation, unspecified: Secondary | ICD-10-CM

## 2015-06-16 ENCOUNTER — Encounter: Payer: Self-pay | Admitting: Family Medicine

## 2015-06-16 ENCOUNTER — Ambulatory Visit (INDEPENDENT_AMBULATORY_CARE_PROVIDER_SITE_OTHER): Payer: Medicare Other | Admitting: Family Medicine

## 2015-06-16 VITALS — BP 120/80 | Temp 98.1°F | Wt 159.0 lb

## 2015-06-16 DIAGNOSIS — D62 Acute posthemorrhagic anemia: Secondary | ICD-10-CM | POA: Diagnosis not present

## 2015-06-16 LAB — CBC WITH DIFFERENTIAL/PLATELET
Basophils Absolute: 0 10*3/uL (ref 0.0–0.1)
Basophils Relative: 0.4 % (ref 0.0–3.0)
Eosinophils Absolute: 0 10*3/uL (ref 0.0–0.7)
Eosinophils Relative: 0.2 % (ref 0.0–5.0)
HCT: 32.3 % — ABNORMAL LOW (ref 39.0–52.0)
Hemoglobin: 10.3 g/dL — ABNORMAL LOW (ref 13.0–17.0)
Lymphocytes Relative: 8.2 % — ABNORMAL LOW (ref 12.0–46.0)
Lymphs Abs: 0.5 10*3/uL — ABNORMAL LOW (ref 0.7–4.0)
MCHC: 31.8 g/dL (ref 30.0–36.0)
MCV: 88.6 fl (ref 78.0–100.0)
Monocytes Absolute: 0.2 10*3/uL (ref 0.1–1.0)
Monocytes Relative: 3.1 % (ref 3.0–12.0)
Neutro Abs: 5.7 10*3/uL (ref 1.4–7.7)
Neutrophils Relative %: 88.1 % — ABNORMAL HIGH (ref 43.0–77.0)
Platelets: 288 10*3/uL (ref 150.0–400.0)
RBC: 3.64 Mil/uL — ABNORMAL LOW (ref 4.22–5.81)
RDW: 18.2 % — ABNORMAL HIGH (ref 11.5–15.5)
WBC: 6.5 10*3/uL (ref 4.0–10.5)

## 2015-06-16 LAB — RETICULOCYTES
ABS Retic: 55.1 10*3/uL (ref 19.0–186.0)
RBC.: 3.67 MIL/uL — ABNORMAL LOW (ref 4.22–5.81)
Retic Ct Pct: 1.5 % (ref 0.4–2.3)

## 2015-06-16 LAB — IRON: Iron: 31 ug/dL — ABNORMAL LOW (ref 42–165)

## 2015-06-16 NOTE — Progress Notes (Signed)
   Subjective:    Patient ID: [name], male    DOB: Apr 17, 1936, 79 y.o.   MRN: [id number]  HPI [name] is a 79 year old divorce male nonsmoker who comes in today for follow-up of anemia  He tells me he had surgery for spinal stenosis in June 2016 recovered with no sequelae.  On August 17 he had a total left knee replacement. He subsequent he had a lot of bleeding. Postop he was given 2 units of packed cells. Discharge hemoglobin was 9. He's not taken any iron. He is on Coumadin and his most recent INR 9 days ago was 2.2.  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 check labs.......... start on iron 1 tablet twice daily,,,,,, follow-up CBC in 6 weeks

## 2015-06-16 NOTE — Patient Instructions (Signed)
Start oral iron......... one tablet twice daily  Milk of magnesia...... 2 tablespoons twice daily to prevent constipation  Labs today  Follow-up the first week in December

## 2015-06-28 ENCOUNTER — Ambulatory Visit: Payer: Medicare Other | Admitting: Cardiology

## 2015-06-30 ENCOUNTER — Ambulatory Visit (INDEPENDENT_AMBULATORY_CARE_PROVIDER_SITE_OTHER): Payer: Medicare Other | Admitting: General Practice

## 2015-06-30 DIAGNOSIS — Z5181 Encounter for therapeutic drug level monitoring: Secondary | ICD-10-CM | POA: Diagnosis not present

## 2015-06-30 DIAGNOSIS — I359 Nonrheumatic aortic valve disorder, unspecified: Secondary | ICD-10-CM

## 2015-06-30 LAB — POCT INR: INR: 2.5

## 2015-06-30 NOTE — Progress Notes (Signed)
Pre visit review using our clinic review tool, if applicable. No additional management support is needed unless otherwise documented below in the visit note. 

## 2015-06-30 NOTE — Progress Notes (Signed)
I have reviewed and agree with the plan. 

## 2015-07-13 ENCOUNTER — Encounter: Payer: Self-pay | Admitting: Cardiology

## 2015-07-20 DIAGNOSIS — Z471 Aftercare following joint replacement surgery: Secondary | ICD-10-CM | POA: Diagnosis not present

## 2015-07-20 DIAGNOSIS — Z96652 Presence of left artificial knee joint: Secondary | ICD-10-CM | POA: Diagnosis not present

## 2015-07-21 ENCOUNTER — Encounter: Payer: Self-pay | Admitting: *Deleted

## 2015-07-21 NOTE — Progress Notes (Signed)
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 Preoperative cardiac catheterization revealed normal coronary arteries. He has permanent atrial fibrillation. Previous carotid Dopplers due to bruits revealed 0-39% bilateral stenosis. A monitor in August of 2011 showed occasional 3.1 second pause. Tenoretic was discontinued and Norvasc added for his blood pressure. CTA in April of 2014 showed atheromatous thoracic aorta without aneurysm, dissection or stenosis. Echocardiogram May 2016 showed vigorous LV function. There was a prosthetic aortic valve with mean gradient 15 mmHg. There was mild mitral regurgitation, moderate left atrial enlargement, mild right ventricular enlargement, moderate right atrial enlargement and mild tricuspid regurgitation. Holter monitor October 2016 showed atrial fibrillation rate controlled. Since I last saw him His dyspnea is much improved. No chest pain, palpitations, syncope, bleeding or pedal edema.  Current Outpatient Prescriptions  Medication Sig Dispense Refill  . allopurinol (ZYLOPRIM) 300 MG tablet TAKE ONE TABLET BY MOUTH EVERY DAY 100 tablet 3  . amLODipine (NORVASC) 5 MG tablet One tab daily (Patient taking differently: Take 5 mg by mouth daily. One tab daily) 90 tablet 3  . furosemide (LASIX) 20 MG tablet One tablet daily as needed for swelling 30 tablet 6  . predniSONE (DELTASONE) 20 MG tablet TAKE ONE-HALF TABLET BY MOUTH EVERY OTHER DAY 50 tablet 0  . tamsulosin (FLOMAX) 0.4 MG CAPS capsule Take 0.4 mg by mouth daily after breakfast.     . traZODone (DESYREL) 50 MG tablet TAKE 1 TAB AT BEDTIME AS NEEDED (TO REPLACE AMBIEN ) 90 tablet 3  . warfarin (COUMADIN) 5 MG tablet Take as directed by the coumadin clinic (Patient taking differently: Take 2.5-5 mg by mouth every morning. Take as directed by the coumadin clinic  Monday Wednesday Friday 5 mg every other day  take 2.5 mg) 90 tablet 1   No current facility-administered medications for this visit.     Past Medical History  Diagnosis Date  . HTN (hypertension)   . Atrial fibrillation ([place])   . Aortic stenosis   . Erectile dysfunction   . Gout   . MVP (mitral valve prolapse)   . Baker cyst     Left knee  . Eczema   . DJD (degenerative joint disease)     knees  . Shortness of breath dyspnea     with activity    Past Surgical History  Procedure Laterality Date  . Aortic valve replacement  ~2004  . Right total hip  ~4 years ago  .  polyps vocal cord  1994  . Rotator cuff repair Left ~2009  . Lumbar laminectomy/decompression microdiscectomy N/A 01/15/2015    Procedure: 2 LEVEL DECOMPRESSIVE LUMBAR LAMINECTOMY L3-L4,L4-L5;  Surgeon: Latanya Maudlin, MD;  Location: [facility];  Service: Orthopedics;  Laterality: N/A;  . Hernia repair  ~1978  . Hernia repair  [phone]  . Total knee arthroplasty Left 04/14/2015    Procedure: LEFT TOTAL KNEE ARTHROPLASTY;  Surgeon: Latanya Maudlin, MD;  Location: [facility];  Service: Orthopedics;  Laterality: Left;    Social History   Social History  . Marital Status: Divorced    Spouse Name: N/A  . Number of Children: N/A  . Years of Education: N/A   Occupational History  . Not on file.   Social History Main Topics  . Smoking status: Former Smoker -- 30 years    Types: Cigarettes    Quit date: 08/28/2002  . Smokeless tobacco: Never Used  .  Alcohol Use: Yes     Comment: social  . Drug Use: No  . Sexual Activity: Not on file   Other Topics Concern  . Not on file   Social History Narrative    ROS: no fevers or chills, productive cough, hemoptysis, dysphasia, odynophagia, melena, hematochezia, dysuria, hematuria, rash, seizure activity, orthopnea, PND, pedal edema, claudication. Remaining systems are negative.  Physical Exam: Well-developed well-nourished in no acute distress.  Skin is warm and dry.  HEENT is normal.  Neck is supple.  Chest is  clear to auscultation with normal expansion.  Cardiovascular exam is irregular Abdominal exam nontender or distended. No masses palpated. Extremities show no edema. neuro grossly intact

## 2015-07-28 ENCOUNTER — Ambulatory Visit (INDEPENDENT_AMBULATORY_CARE_PROVIDER_SITE_OTHER): Payer: Medicare Other | Admitting: Cardiology

## 2015-07-28 ENCOUNTER — Encounter: Payer: Self-pay | Admitting: Cardiology

## 2015-07-28 VITALS — BP 144/72 | HR 76 | Ht 68.5 in | Wt 169.4 lb

## 2015-07-28 DIAGNOSIS — I359 Nonrheumatic aortic valve disorder, unspecified: Secondary | ICD-10-CM

## 2015-07-28 DIAGNOSIS — I482 Chronic atrial fibrillation: Secondary | ICD-10-CM | POA: Diagnosis not present

## 2015-07-28 DIAGNOSIS — I10 Essential (primary) hypertension: Secondary | ICD-10-CM | POA: Diagnosis not present

## 2015-07-28 DIAGNOSIS — I4821 Permanent atrial fibrillation: Secondary | ICD-10-CM

## 2015-07-28 NOTE — Patient Instructions (Signed)
Your physician wants you to follow-up in: 6 MONTHS WITH DR CRENSHAW You will receive a reminder letter in the mail two months in advance. If you don't receive a letter, please call our office to schedule the follow-up appointment.   If you need a refill on your cardiac medications before your next appointment, please call your pharmacy.  

## 2015-07-28 NOTE — Assessment & Plan Note (Signed)
Patient remains in permanent atrial fibrillation. His rate is controlled on no medications. Continue Coumadin.

## 2015-07-28 NOTE — Assessment & Plan Note (Signed)
Management per primary care. 

## 2015-07-28 NOTE — Assessment & Plan Note (Signed)
Continue SBE prophylaxis. 

## 2015-07-28 NOTE — Assessment & Plan Note (Signed)
Blood pressure controlled. Continue present medications. 

## 2015-07-29 ENCOUNTER — Other Ambulatory Visit: Payer: Self-pay | Admitting: Family Medicine

## 2015-08-02 DIAGNOSIS — R350 Frequency of micturition: Secondary | ICD-10-CM | POA: Diagnosis not present

## 2015-08-02 DIAGNOSIS — R338 Other retention of urine: Secondary | ICD-10-CM | POA: Diagnosis not present

## 2015-08-10 ENCOUNTER — Other Ambulatory Visit (INDEPENDENT_AMBULATORY_CARE_PROVIDER_SITE_OTHER): Payer: Medicare Other

## 2015-08-10 DIAGNOSIS — I10 Essential (primary) hypertension: Secondary | ICD-10-CM

## 2015-08-10 DIAGNOSIS — Z0000 Encounter for general adult medical examination without abnormal findings: Secondary | ICD-10-CM | POA: Diagnosis not present

## 2015-08-10 DIAGNOSIS — Z125 Encounter for screening for malignant neoplasm of prostate: Secondary | ICD-10-CM | POA: Diagnosis not present

## 2015-08-10 LAB — CBC WITH DIFFERENTIAL/PLATELET
Basophils Absolute: 0 10*3/uL (ref 0.0–0.1)
Basophils Relative: 0.4 % (ref 0.0–3.0)
Eosinophils Absolute: 0.1 10*3/uL (ref 0.0–0.7)
Eosinophils Relative: 1.4 % (ref 0.0–5.0)
HCT: 38.5 % — ABNORMAL LOW (ref 39.0–52.0)
Hemoglobin: 12.7 g/dL — ABNORMAL LOW (ref 13.0–17.0)
Lymphocytes Relative: 10.6 % — ABNORMAL LOW (ref 12.0–46.0)
Lymphs Abs: 0.9 10*3/uL (ref 0.7–4.0)
MCHC: 33.1 g/dL (ref 30.0–36.0)
MCV: 91.9 fl (ref 78.0–100.0)
Monocytes Absolute: 0.5 10*3/uL (ref 0.1–1.0)
Monocytes Relative: 5.5 % (ref 3.0–12.0)
Neutro Abs: 6.8 10*3/uL (ref 1.4–7.7)
Neutrophils Relative %: 82.1 % — ABNORMAL HIGH (ref 43.0–77.0)
Platelets: 197 10*3/uL (ref 150.0–400.0)
RBC: 4.19 Mil/uL — ABNORMAL LOW (ref 4.22–5.81)
RDW: 19.9 % — ABNORMAL HIGH (ref 11.5–15.5)
WBC: 8.3 10*3/uL (ref 4.0–10.5)

## 2015-08-10 LAB — BASIC METABOLIC PANEL
BUN: 19 mg/dL (ref 6–23)
CO2: 34 mEq/L — ABNORMAL HIGH (ref 19–32)
Calcium: 9.1 mg/dL (ref 8.4–10.5)
Chloride: 102 mEq/L (ref 96–112)
Creatinine, Ser: 0.91 mg/dL (ref 0.40–1.50)
GFR: 85.71 mL/min (ref >60.00)
Glucose, Bld: 105 mg/dL — ABNORMAL HIGH (ref 70–99)
Potassium: 3.5 mEq/L (ref 3.5–5.1)
Sodium: 143 mEq/L (ref 135–145)

## 2015-08-10 LAB — HEPATIC FUNCTION PANEL
ALT: 13 U/L (ref 0–53)
AST: 18 U/L (ref 0–37)
Albumin: 4.2 g/dL (ref 3.5–5.2)
Alkaline Phosphatase: 76 U/L (ref 39–117)
Bilirubin, Direct: 0.2 mg/dL (ref 0.0–0.3)
Total Bilirubin: 0.9 mg/dL (ref 0.2–1.2)
Total Protein: 6.6 g/dL (ref 6.0–8.3)

## 2015-08-10 LAB — POCT URINALYSIS DIPSTICK
Bilirubin, UA: NEGATIVE
Blood, UA: NEGATIVE
Glucose, UA: NEGATIVE
Ketones, UA: NEGATIVE
Leukocytes, UA: NEGATIVE
Nitrite, UA: NEGATIVE
Protein, UA: NEGATIVE
Spec Grav, UA: 1.015
Urobilinogen, UA: 0.2
pH, UA: 6.5

## 2015-08-10 LAB — TSH: TSH: 0.31 u[IU]/mL — ABNORMAL LOW (ref 0.35–4.50)

## 2015-08-10 LAB — LIPID PANEL
Cholesterol: 182 mg/dL (ref 0–200)
HDL: 66 mg/dL (ref >39.00)
LDL Cholesterol: 95 mg/dL (ref 0–99)
NonHDL: 115.53
Total CHOL/HDL Ratio: 3
Triglycerides: 105 mg/dL (ref 0.0–149.0)
VLDL: 21 mg/dL (ref 0.0–40.0)

## 2015-08-10 LAB — PSA: PSA: 3.38 ng/mL (ref 0.10–4.00)

## 2015-08-11 ENCOUNTER — Ambulatory Visit (INDEPENDENT_AMBULATORY_CARE_PROVIDER_SITE_OTHER): Payer: Medicare Other | Admitting: General Practice

## 2015-08-11 DIAGNOSIS — Z5181 Encounter for therapeutic drug level monitoring: Secondary | ICD-10-CM

## 2015-08-11 DIAGNOSIS — I359 Nonrheumatic aortic valve disorder, unspecified: Secondary | ICD-10-CM | POA: Diagnosis not present

## 2015-08-11 LAB — POCT INR: INR: 2.1

## 2015-08-11 NOTE — Progress Notes (Signed)
Pre visit review using our clinic review tool, if applicable. No additional management support is needed unless otherwise documented below in the visit note. 

## 2015-08-11 NOTE — Progress Notes (Signed)
I have reviewed and agree with the plan. 

## 2015-08-17 ENCOUNTER — Ambulatory Visit (INDEPENDENT_AMBULATORY_CARE_PROVIDER_SITE_OTHER): Payer: Medicare Other | Admitting: Family Medicine

## 2015-08-17 ENCOUNTER — Encounter: Payer: Self-pay | Admitting: Family Medicine

## 2015-08-17 VITALS — BP 140/90 | Temp 97.3°F | Ht 66.5 in | Wt 171.0 lb

## 2015-08-17 DIAGNOSIS — Z23 Encounter for immunization: Secondary | ICD-10-CM | POA: Diagnosis not present

## 2015-08-17 DIAGNOSIS — Z0000 Encounter for general adult medical examination without abnormal findings: Secondary | ICD-10-CM | POA: Insufficient documentation

## 2015-08-17 DIAGNOSIS — I4821 Permanent atrial fibrillation: Secondary | ICD-10-CM

## 2015-08-17 DIAGNOSIS — I10 Essential (primary) hypertension: Secondary | ICD-10-CM

## 2015-08-17 DIAGNOSIS — K2100 Gastro-esophageal reflux disease with esophagitis, without bleeding: Secondary | ICD-10-CM

## 2015-08-17 DIAGNOSIS — M1000 Idiopathic gout, unspecified site: Secondary | ICD-10-CM

## 2015-08-17 DIAGNOSIS — R609 Edema, unspecified: Secondary | ICD-10-CM

## 2015-08-17 MED ORDER — TRAZODONE HCL 50 MG PO TABS: ORAL_TABLET | ORAL | Status: DC

## 2015-08-17 MED ORDER — FUROSEMIDE 20 MG PO TABS: ORAL_TABLET | ORAL | Status: DC

## 2015-08-17 MED ORDER — ALLOPURINOL 300 MG PO TABS: 300.0000 mg | ORAL_TABLET | Freq: Every day | ORAL | Status: DC

## 2015-08-17 MED ORDER — PREDNISONE 20 MG PO TABS: ORAL_TABLET | ORAL | Status: DC

## 2015-08-17 MED ORDER — AMLODIPINE BESYLATE 5 MG PO TABS: 5.0000 mg | ORAL_TABLET | Freq: Every day | ORAL | Status: DC

## 2015-08-17 NOTE — Progress Notes (Signed)
Pre visit review using our clinic review tool, if applicable. No additional management support is needed unless otherwise documented below in the visit note. 

## 2015-08-17 NOTE — Progress Notes (Signed)
Subjective:    Patient ID: [name], male    DOB: 1935-09-05, 79 y.o.   MRN: [id number]  HPI [name] is a 79 year old divorce male nonsmoker who comes in today for general physical examination  He has a history of gout and takes allopurinol 300 mg daily  He has a history of hypertension on Norvasc 5 mg daily BP 140/90  He takes Flomax 0.4 because of BPH. He saw his urologist recently checkup was normal  He sees his cardiologist on a yearly basis causes she's had valvular heart surgery. He's also on Coumadin 5 mg daily.  He takes Desyrel 50 mg at bedtime for sleep and Deltasone 10 mg Monday Wednesday Friday because of a history of urticaria etiology unknown  This past year he said his left knee replaced and lumbar disc surgery.  He's had a history of low iron. He's on chronic Coumadin therapy. Hemoglobin now is 12.7 on one iron tablet daily.  He has a raspy voice. We send him to ENT years ago because he had change in his voice. Diagnostic evaluation showed a polyp. The polyp was removed however his voice continues to stay raspy.  Tetanus booster 2005........ he says he'll get his tetanus booster at the health department  We'll give a flu shot and Pneumovax 13 today  Cognitive function normal he walks daily home health safety reviewed no issues identified, no guns in the house, he does have a healthcare power of attorney and living well    Review of Systems  Constitutional: Negative.   HENT: Negative.   Eyes: Negative.   Respiratory: Negative.   Cardiovascular: Negative.   Gastrointestinal: Negative.   Endocrine: Negative.   Genitourinary: Negative.   Musculoskeletal: Negative.   Skin: Negative.   Allergic/Immunologic: Negative.   Neurological: Negative.   Hematological: Negative.   Psychiatric/Behavioral: Negative.        Objective:   Physical Exam  Constitutional: He is oriented to person, place, and time. He appears well-developed and well-nourished.  HENT:    Head: Normocephalic and atraumatic.  Right Ear: External ear normal.  Left Ear: External ear normal.  Nose: Nose normal.  Mouth/Throat: Oropharynx is clear and moist.  Eyes: Conjunctivae and EOM are normal. Pupils are equal, round, and reactive to light. Right eye exhibits no discharge. Left eye exhibits no discharge. No scleral icterus.  Neck: Normal range of motion. Neck supple. No JVD present. No tracheal deviation present. No thyromegaly present.  Cardiovascular: Normal rate, regular rhythm, normal heart sounds and intact distal pulses.  Exam reveals no gallop and no friction rub.   No murmur heard. Bilateral carotid bruits,,,,,,,,, followed in cardiology  Scar midline chest from previous valvular heart surgery. Slight murmur as previously noted no change. No aortic bruits peripheral pulses 2+ and symmetrical  Pulmonary/Chest: Effort normal and breath sounds normal. No stridor. No respiratory distress. He has no wheezes. He has no rales. He exhibits no tenderness.  Abdominal: Soft. Bowel sounds are normal. He exhibits no distension and no mass. There is no tenderness. There is no rebound and no guarding.  Genitourinary:  Genitourinary exam done by urologist recently therefore not repeated  Musculoskeletal: Normal range of motion. He exhibits no edema or tenderness.  Lymphadenopathy:    He has no cervical adenopathy.  Neurological: He is alert and oriented to person, place, and time. He has normal reflexes. No cranial nerve deficit. He exhibits normal muscle tone.  Skin: Skin is warm and dry. No rash noted. No erythema.  No pallor.  Skin exam normal except for scar midline lower back and left knee from previous surgery this past 12 months  Psychiatric: He has a normal mood and affect. His behavior is normal. Judgment and thought content normal.  Nursing note and vitals reviewed.         Assessment & Plan:  Hypertension at goal,,,,,,, continue Norvasc 5 mg daily  History  gout,,,,,,,,, continue allopurinol 300 mg daily  BPH,,,,,,,, continue Flomax 0.4  Sleep dysfunction,,,,,,,,,,,, continue trazodone 50 mg at bedtime  Status post aortic valve surgery,,,,,,,,, continue Coumadin  History of anemia,,,,,,,, iron one tablet daily to keep hemoglobin around 12-13,

## 2015-08-17 NOTE — Patient Instructions (Signed)
Continue current medications  Take one iron tablet nightly at bedtime  Return in one year for general physical examination sooner if any problems........... call in August for your physical examination in December....... Tommi Rumps or Almyra Free are 2 new nurse practitioners or Dr. Martinique  It's been my pleasure and honor to have seen you in all your family members all these years.

## 2015-09-06 DIAGNOSIS — Z96652 Presence of left artificial knee joint: Secondary | ICD-10-CM | POA: Diagnosis not present

## 2015-09-06 DIAGNOSIS — Z471 Aftercare following joint replacement surgery: Secondary | ICD-10-CM | POA: Diagnosis not present

## 2015-09-22 ENCOUNTER — Ambulatory Visit (INDEPENDENT_AMBULATORY_CARE_PROVIDER_SITE_OTHER): Payer: Medicare Other | Admitting: General Practice

## 2015-09-22 DIAGNOSIS — Z5181 Encounter for therapeutic drug level monitoring: Secondary | ICD-10-CM | POA: Diagnosis not present

## 2015-09-22 DIAGNOSIS — I359 Nonrheumatic aortic valve disorder, unspecified: Secondary | ICD-10-CM

## 2015-09-22 DIAGNOSIS — I4891 Unspecified atrial fibrillation: Secondary | ICD-10-CM

## 2015-09-22 LAB — POCT INR: INR: 2.3

## 2015-09-22 NOTE — Progress Notes (Signed)
Pre visit review using our clinic review tool, if applicable. No additional management support is needed unless otherwise documented below in the visit note. 

## 2015-10-25 ENCOUNTER — Other Ambulatory Visit: Payer: Self-pay | Admitting: Family Medicine

## 2015-11-03 ENCOUNTER — Ambulatory Visit (INDEPENDENT_AMBULATORY_CARE_PROVIDER_SITE_OTHER): Payer: Medicare Other | Admitting: General Practice

## 2015-11-03 DIAGNOSIS — I4891 Unspecified atrial fibrillation: Secondary | ICD-10-CM

## 2015-11-03 DIAGNOSIS — I359 Nonrheumatic aortic valve disorder, unspecified: Secondary | ICD-10-CM

## 2015-11-03 DIAGNOSIS — Z5181 Encounter for therapeutic drug level monitoring: Secondary | ICD-10-CM

## 2015-11-03 LAB — POCT INR: INR: 1.9

## 2015-11-03 NOTE — Progress Notes (Signed)
Pre visit review using our clinic review tool, if applicable. No additional management support is needed unless otherwise documented below in the visit note. 

## 2015-11-03 NOTE — Progress Notes (Signed)
I have reviewed and agree with the plan. 

## 2015-11-25 ENCOUNTER — Other Ambulatory Visit: Payer: Self-pay | Admitting: Family Medicine

## 2015-11-25 MED ORDER — PREDNISONE 20 MG PO TABS: ORAL_TABLET | ORAL | Status: DC

## 2015-12-15 ENCOUNTER — Ambulatory Visit (INDEPENDENT_AMBULATORY_CARE_PROVIDER_SITE_OTHER): Payer: Medicare Other | Admitting: General Practice

## 2015-12-15 DIAGNOSIS — I359 Nonrheumatic aortic valve disorder, unspecified: Secondary | ICD-10-CM | POA: Diagnosis not present

## 2015-12-15 DIAGNOSIS — Z5181 Encounter for therapeutic drug level monitoring: Secondary | ICD-10-CM

## 2015-12-15 DIAGNOSIS — I4891 Unspecified atrial fibrillation: Secondary | ICD-10-CM

## 2015-12-15 LAB — POCT INR: INR: 2.1

## 2015-12-15 NOTE — Progress Notes (Signed)
Pre visit review using our clinic review tool, if applicable. No additional management support is needed unless otherwise documented below in the visit note. 

## 2015-12-15 NOTE — Progress Notes (Signed)
I have reviewed and agree with the plan. 

## 2016-01-26 ENCOUNTER — Ambulatory Visit (INDEPENDENT_AMBULATORY_CARE_PROVIDER_SITE_OTHER): Payer: Medicare Other | Admitting: General Practice

## 2016-01-26 DIAGNOSIS — Z5181 Encounter for therapeutic drug level monitoring: Secondary | ICD-10-CM | POA: Diagnosis not present

## 2016-01-26 DIAGNOSIS — I359 Nonrheumatic aortic valve disorder, unspecified: Secondary | ICD-10-CM | POA: Diagnosis not present

## 2016-01-26 DIAGNOSIS — I4891 Unspecified atrial fibrillation: Secondary | ICD-10-CM

## 2016-01-26 LAB — POCT INR: INR: 3

## 2016-01-26 NOTE — Progress Notes (Signed)
I have reviewed and agree with the plan. 

## 2016-01-26 NOTE — Progress Notes (Signed)
Pre visit review using our clinic review tool, if applicable. No additional management support is needed unless otherwise documented below in the visit note. 

## 2016-03-01 NOTE — Progress Notes (Signed)
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 Preoperative cardiac catheterization revealed normal coronary arteries. He has permanent atrial fibrillation. Previous carotid Dopplers due to bruits revealed 0-39% bilateral stenosis. A monitor in August of 2011 showed occasional 3.1 second pause. Tenoretic was discontinued and Norvasc added for his blood pressure. CTA in April of 2014 showed atheromatous thoracic aorta without aneurysm, dissection or stenosis. Echocardiogram May 2016 showed vigorous LV function. There was a prosthetic aortic valve with mean gradient 15 mmHg. There was mild mitral regurgitation, moderate left atrial enlargement, mild right ventricular enlargement, moderate right atrial enlargement and mild tricuspid regurgitation. Holter monitor October 2016 showed atrial fibrillation rate controlled. Since I last saw him   Current Outpatient Prescriptions  Medication Sig Dispense Refill  . allopurinol (ZYLOPRIM) 300 MG tablet Take 1 tablet (300 mg total) by mouth daily. 100 tablet 3  . amLODipine (NORVASC) 5 MG tablet Take 1 tablet (5 mg total) by mouth daily. 90 tablet 3  . ferrous sulfate 325 (65 FE) MG tablet Take 325 mg by mouth 2 (two) times daily with a meal.    . furosemide (LASIX) 20 MG tablet One tablet daily as needed for swelling 100 tablet 3  . predniSONE (DELTASONE) 20 MG tablet TAKE ONE-HALF TABLET BY MOUTH EVERY OTHER DAY 60 tablet 1  . tamsulosin (FLOMAX) 0.4 MG CAPS capsule Take 0.4 mg by mouth daily after breakfast.     . traZODone (DESYREL) 50 MG tablet TAKE ONE TABLET BY MOUTH AT BEDTIME AS NEEDED 90 tablet 3  . warfarin (COUMADIN) 5 MG tablet TAKE AS DIRECTED BY THE COUMADIN CLINIC 90 tablet 1   No current facility-administered medications for this visit.     Past Medical History  Diagnosis Date  . HTN (hypertension)   . Atrial fibrillation ([place])   .  Aortic stenosis   . Erectile dysfunction   . Gout   . MVP (mitral valve prolapse)   . Baker cyst     Left knee  . Eczema   . DJD (degenerative joint disease)     knees  . Shortness of breath dyspnea     with activity    Past Surgical History  Procedure Laterality Date  . Aortic valve replacement  ~2004  . Right total hip  ~4 years ago  .  polyps vocal cord  1994  . Rotator cuff repair Left ~2009  . Lumbar laminectomy/decompression microdiscectomy N/A 01/15/2015    Procedure: 2 LEVEL DECOMPRESSIVE LUMBAR LAMINECTOMY L3-L4,L4-L5;  Surgeon: Latanya Maudlin, MD;  Location: [facility];  Service: Orthopedics;  Laterality: N/A;  . Hernia repair  ~1978  . Hernia repair  [phone]  . Total knee arthroplasty Left 04/14/2015    Procedure: LEFT TOTAL KNEE ARTHROPLASTY;  Surgeon: Latanya Maudlin, MD;  Location: [facility];  Service: Orthopedics;  Laterality: Left;    Social History   Social History  . Marital Status: Divorced    Spouse Name: N/A  . Number of Children: N/A  . Years of Education: N/A   Occupational History  . Not on file.   Social History Main Topics  . Smoking status: Former Smoker -- 30 years    Types: Cigarettes    Quit date: 08/28/2002  . Smokeless tobacco: Never Used  . Alcohol Use: Yes     Comment: social  . Drug Use: No  . Sexual Activity: Not on file   Other  Topics Concern  . Not on file   Social History Narrative    Family History  Problem Relation Age of Onset  . Diabetes    . Stroke      ROS: no fevers or chills, productive cough, hemoptysis, dysphasia, odynophagia, melena, hematochezia, dysuria, hematuria, rash, seizure activity, orthopnea, PND, pedal edema, claudication. Remaining systems are negative.  Physical Exam: Well-developed well-nourished in no acute distress.  Skin is warm and dry.  HEENT is normal.  Neck is supple.  Chest is clear to auscultation with normal expansion.  Cardiovascular exam is irregular, 2/6 systolic murmur left sternal  border. Abdominal exam nontender or distended. No masses palpated. Extremities show no edema. neuro grossly intact  ECG Atrial fibrillation at a rate of 92. Occasional PVC or aberrantly conducted beat. No ST changes.  A/P  1 Status post aortic valve replacement-continue SBE prophylaxis. 2 Permanent atrial fibrillation-Rate is controlled on no medications. Continue Coumadin. 3 hypertension-blood pressure is mildly elevated but typically controlled at home. Continue amlodipine and follow.  Kirk Ruths, MD

## 2016-03-06 ENCOUNTER — Ambulatory Visit (INDEPENDENT_AMBULATORY_CARE_PROVIDER_SITE_OTHER): Payer: Medicare Other | Admitting: Cardiology

## 2016-03-06 ENCOUNTER — Encounter: Payer: Self-pay | Admitting: Cardiology

## 2016-03-06 VITALS — BP 150/60 | HR 92 | Ht 66.0 in | Wt 173.0 lb

## 2016-03-06 DIAGNOSIS — I10 Essential (primary) hypertension: Secondary | ICD-10-CM | POA: Diagnosis not present

## 2016-03-06 DIAGNOSIS — I359 Nonrheumatic aortic valve disorder, unspecified: Secondary | ICD-10-CM | POA: Diagnosis not present

## 2016-03-06 DIAGNOSIS — I481 Persistent atrial fibrillation: Secondary | ICD-10-CM | POA: Diagnosis not present

## 2016-03-06 DIAGNOSIS — I482 Chronic atrial fibrillation, unspecified: Secondary | ICD-10-CM

## 2016-03-06 DIAGNOSIS — Z96652 Presence of left artificial knee joint: Secondary | ICD-10-CM | POA: Diagnosis not present

## 2016-03-06 DIAGNOSIS — I4819 Other persistent atrial fibrillation: Secondary | ICD-10-CM

## 2016-03-06 DIAGNOSIS — Z471 Aftercare following joint replacement surgery: Secondary | ICD-10-CM | POA: Diagnosis not present

## 2016-03-06 DIAGNOSIS — M1712 Unilateral primary osteoarthritis, left knee: Secondary | ICD-10-CM | POA: Diagnosis not present

## 2016-03-06 NOTE — Patient Instructions (Signed)
Your physician wants you to follow-up in: ONE YEAR WITH DR CRENSHAW You will receive a reminder letter in the mail two months in advance. If you don't receive a letter, please call our office to schedule the follow-up appointment.   If you need a refill on your cardiac medications before your next appointment, please call your pharmacy.  

## 2016-03-08 ENCOUNTER — Ambulatory Visit (INDEPENDENT_AMBULATORY_CARE_PROVIDER_SITE_OTHER): Payer: Medicare Other | Admitting: General Practice

## 2016-03-08 DIAGNOSIS — I359 Nonrheumatic aortic valve disorder, unspecified: Secondary | ICD-10-CM

## 2016-03-08 DIAGNOSIS — Z5181 Encounter for therapeutic drug level monitoring: Secondary | ICD-10-CM | POA: Diagnosis not present

## 2016-03-08 DIAGNOSIS — I4891 Unspecified atrial fibrillation: Secondary | ICD-10-CM

## 2016-03-08 LAB — POCT INR: INR: 2.3

## 2016-03-08 NOTE — Progress Notes (Signed)
Pre visit review using our clinic review tool, if applicable. No additional management support is needed unless otherwise documented below in the visit note. 

## 2016-03-08 NOTE — Progress Notes (Signed)
I have reviewed and agree with the plan. 

## 2016-03-14 DIAGNOSIS — H2513 Age-related nuclear cataract, bilateral: Secondary | ICD-10-CM | POA: Diagnosis not present

## 2016-04-19 ENCOUNTER — Ambulatory Visit (INDEPENDENT_AMBULATORY_CARE_PROVIDER_SITE_OTHER): Payer: Medicare Other | Admitting: General Practice

## 2016-04-19 DIAGNOSIS — Z5181 Encounter for therapeutic drug level monitoring: Secondary | ICD-10-CM | POA: Diagnosis not present

## 2016-04-19 DIAGNOSIS — I359 Nonrheumatic aortic valve disorder, unspecified: Secondary | ICD-10-CM

## 2016-04-19 LAB — POCT INR: INR: 2.3

## 2016-05-31 ENCOUNTER — Ambulatory Visit (INDEPENDENT_AMBULATORY_CARE_PROVIDER_SITE_OTHER): Payer: Medicare Other | Admitting: General Practice

## 2016-05-31 DIAGNOSIS — Z5181 Encounter for therapeutic drug level monitoring: Secondary | ICD-10-CM

## 2016-05-31 DIAGNOSIS — I359 Nonrheumatic aortic valve disorder, unspecified: Secondary | ICD-10-CM

## 2016-05-31 LAB — POCT INR: INR: 2.8

## 2016-06-27 DIAGNOSIS — L821 Other seborrheic keratosis: Secondary | ICD-10-CM | POA: Diagnosis not present

## 2016-06-27 DIAGNOSIS — Z1283 Encounter for screening for malignant neoplasm of skin: Secondary | ICD-10-CM | POA: Diagnosis not present

## 2016-07-14 ENCOUNTER — Ambulatory Visit: Payer: Medicare Other

## 2016-07-19 ENCOUNTER — Ambulatory Visit (INDEPENDENT_AMBULATORY_CARE_PROVIDER_SITE_OTHER): Payer: Medicare Other | Admitting: General Practice

## 2016-07-19 DIAGNOSIS — Z5181 Encounter for therapeutic drug level monitoring: Secondary | ICD-10-CM

## 2016-07-19 DIAGNOSIS — I359 Nonrheumatic aortic valve disorder, unspecified: Secondary | ICD-10-CM

## 2016-07-19 LAB — POCT INR: INR: 2.2

## 2016-07-19 NOTE — Patient Instructions (Signed)
Pre visit review using our clinic review tool, if applicable. No additional management support is needed unless otherwise documented below in the visit note. 

## 2016-07-21 NOTE — Progress Notes (Signed)
I have reviewed and agree with the plan. 

## 2016-08-14 ENCOUNTER — Other Ambulatory Visit: Payer: Self-pay | Admitting: Family Medicine

## 2016-08-14 DIAGNOSIS — R609 Edema, unspecified: Secondary | ICD-10-CM

## 2016-08-29 DIAGNOSIS — Z125 Encounter for screening for malignant neoplasm of prostate: Secondary | ICD-10-CM | POA: Diagnosis not present

## 2016-08-30 ENCOUNTER — Ambulatory Visit (INDEPENDENT_AMBULATORY_CARE_PROVIDER_SITE_OTHER): Payer: Medicare Other | Admitting: General Practice

## 2016-08-30 DIAGNOSIS — I359 Nonrheumatic aortic valve disorder, unspecified: Secondary | ICD-10-CM

## 2016-08-30 DIAGNOSIS — Z5181 Encounter for therapeutic drug level monitoring: Secondary | ICD-10-CM

## 2016-08-30 DIAGNOSIS — I4891 Unspecified atrial fibrillation: Secondary | ICD-10-CM

## 2016-08-30 LAB — POCT INR: INR: 2

## 2016-08-30 NOTE — Patient Instructions (Signed)
Pre visit review using our clinic review tool, if applicable. No additional management support is needed unless otherwise documented below in the visit note. 

## 2016-08-30 NOTE — Progress Notes (Signed)
I have reviewed and agree with the plan. 

## 2016-09-07 ENCOUNTER — Other Ambulatory Visit (INDEPENDENT_AMBULATORY_CARE_PROVIDER_SITE_OTHER): Payer: Medicare Other

## 2016-09-07 DIAGNOSIS — Z0000 Encounter for general adult medical examination without abnormal findings: Secondary | ICD-10-CM | POA: Diagnosis not present

## 2016-09-07 LAB — CBC WITH DIFFERENTIAL/PLATELET
Basophils Absolute: 0 10*3/uL (ref 0.0–0.1)
Basophils Relative: 0.3 % (ref 0.0–3.0)
Eosinophils Absolute: 0.1 10*3/uL (ref 0.0–0.7)
Eosinophils Relative: 1.6 % (ref 0.0–5.0)
HCT: 40.7 % (ref 39.0–52.0)
Hemoglobin: 13.9 g/dL (ref 13.0–17.0)
Lymphocytes Relative: 12.7 % (ref 12.0–46.0)
Lymphs Abs: 0.9 10*3/uL (ref 0.7–4.0)
MCHC: 34.1 g/dL (ref 30.0–36.0)
MCV: 96.7 fl (ref 78.0–100.0)
Monocytes Absolute: 0.5 10*3/uL (ref 0.1–1.0)
Monocytes Relative: 6.5 % (ref 3.0–12.0)
Neutro Abs: 5.6 10*3/uL (ref 1.4–7.7)
Neutrophils Relative %: 78.9 % — ABNORMAL HIGH (ref 43.0–77.0)
Platelets: 181 10*3/uL (ref 150.0–400.0)
RBC: 4.21 Mil/uL — ABNORMAL LOW (ref 4.22–5.81)
RDW: 14.7 % (ref 11.5–15.5)
WBC: 7.1 10*3/uL (ref 4.0–10.5)

## 2016-09-07 LAB — BASIC METABOLIC PANEL
BUN: 16 mg/dL (ref 6–23)
CO2: 34 mEq/L — ABNORMAL HIGH (ref 19–32)
Calcium: 9.3 mg/dL (ref 8.4–10.5)
Chloride: 100 mEq/L (ref 96–112)
Creatinine, Ser: 0.93 mg/dL (ref 0.40–1.50)
GFR: 83.35 mL/min (ref >60.00)
Glucose, Bld: 97 mg/dL (ref 70–99)
Potassium: 3.6 mEq/L (ref 3.5–5.1)
Sodium: 141 mEq/L (ref 135–145)

## 2016-09-07 LAB — POC URINALSYSI DIPSTICK (AUTOMATED)
Bilirubin, UA: NEGATIVE
Blood, UA: NEGATIVE
Glucose, UA: NEGATIVE
Ketones, UA: NEGATIVE
Leukocytes, UA: NEGATIVE
Nitrite, UA: NEGATIVE
Protein, UA: NEGATIVE
Spec Grav, UA: 1.015
Urobilinogen, UA: 0.2
pH, UA: 7

## 2016-09-07 LAB — TSH: TSH: 0.6 u[IU]/mL (ref 0.35–4.50)

## 2016-09-07 LAB — LIPID PANEL
Cholesterol: 202 mg/dL — ABNORMAL HIGH (ref 0–200)
HDL: 56.7 mg/dL (ref >39.00)
LDL Cholesterol: 117 mg/dL — ABNORMAL HIGH (ref 0–99)
NonHDL: 144.95
Total CHOL/HDL Ratio: 4
Triglycerides: 140 mg/dL (ref 0.0–149.0)
VLDL: 28 mg/dL (ref 0.0–40.0)

## 2016-09-07 LAB — PSA: PSA: 4.09 ng/mL — ABNORMAL HIGH (ref 0.10–4.00)

## 2016-09-07 LAB — HEPATIC FUNCTION PANEL
ALT: 15 U/L (ref 0–53)
AST: 22 U/L (ref 0–37)
Albumin: 4.2 g/dL (ref 3.5–5.2)
Alkaline Phosphatase: 58 U/L (ref 39–117)
Bilirubin, Direct: 0.1 mg/dL (ref 0.0–0.3)
Total Bilirubin: 0.8 mg/dL (ref 0.2–1.2)
Total Protein: 6.7 g/dL (ref 6.0–8.3)

## 2016-09-11 ENCOUNTER — Ambulatory Visit (INDEPENDENT_AMBULATORY_CARE_PROVIDER_SITE_OTHER): Payer: Medicare Other | Admitting: Family Medicine

## 2016-09-11 ENCOUNTER — Encounter: Payer: Self-pay | Admitting: Family Medicine

## 2016-09-11 VITALS — BP 140/70 | HR 94 | Temp 97.5°F | Ht 66.0 in | Wt 177.7 lb

## 2016-09-11 DIAGNOSIS — Z0000 Encounter for general adult medical examination without abnormal findings: Secondary | ICD-10-CM

## 2016-09-11 DIAGNOSIS — I10 Essential (primary) hypertension: Secondary | ICD-10-CM | POA: Diagnosis not present

## 2016-09-11 DIAGNOSIS — L509 Urticaria, unspecified: Secondary | ICD-10-CM | POA: Diagnosis not present

## 2016-09-11 DIAGNOSIS — I4891 Unspecified atrial fibrillation: Secondary | ICD-10-CM | POA: Diagnosis not present

## 2016-09-11 DIAGNOSIS — R609 Edema, unspecified: Secondary | ICD-10-CM

## 2016-09-11 DIAGNOSIS — F419 Anxiety disorder, unspecified: Secondary | ICD-10-CM

## 2016-09-11 DIAGNOSIS — M1000 Idiopathic gout, unspecified site: Secondary | ICD-10-CM

## 2016-09-11 MED ORDER — FUROSEMIDE 20 MG PO TABS: ORAL_TABLET | ORAL | 4 refills | Status: DC

## 2016-09-11 MED ORDER — ALLOPURINOL 300 MG PO TABS: 300.0000 mg | ORAL_TABLET | Freq: Every day | ORAL | 4 refills | Status: DC

## 2016-09-11 MED ORDER — PREDNISONE 20 MG PO TABS: ORAL_TABLET | ORAL | 1 refills | Status: DC

## 2016-09-11 MED ORDER — TRAZODONE HCL 50 MG PO TABS: ORAL_TABLET | ORAL | 4 refills | Status: DC

## 2016-09-11 MED ORDER — TAMSULOSIN HCL 0.4 MG PO CAPS: 0.4000 mg | ORAL_CAPSULE | Freq: Every day | ORAL | 5 refills | Status: DC

## 2016-09-11 MED ORDER — AMLODIPINE BESYLATE 5 MG PO TABS: 5.0000 mg | ORAL_TABLET | Freq: Every day | ORAL | 4 refills | Status: DC

## 2016-09-11 NOTE — Progress Notes (Signed)
[name] is a 81 year old nonsmoking male who comes in today for general physical examination because a history of gout, hypertension, urticaria etiology unknown, BPH, sleep dysfunction and A. fib  He takes Alupent a 3 or milligrams daily to prevent gout  He takes Norvasc 5 mg, Lasix 20 mg for hypertension BP 140/70  He takes prednisone 10 mg Monday Wednesday Friday because of a history of urticaria etiology unknown. We've tried to decrease the dose he has hives.  He takes Flomax 0.4 daily at bedtime because of BPH. He had an evaluation by his urologist January 2.  He takes trazodone 50 mg bedtime for sleep  He takes Coumadin 5 mg daily via the Coumadin clinic because a history of A. fib and nonrheumatic aortic valvular disease.  He gets routine eye care, dental care, colonoscopy 2005 normal  Vaccinations up-to-date except he is doing Pneumovax and shingles vaccine both are recommended. Pneumovax will give here shingles has to call his insurance company.  He had his aortic valve replaced in 2004. No complications. He is also had surgery right hip replacement left knee replacement and left shoulder  Cognitive function normal he walks daily home health safety reviewed no issues identified, no guns in the house, he does have a healthcare power of attorney and living well.  14 point review of systems reviewed otherwise negative  BP 140/70 (BP Location: Left Arm, Patient Position: Sitting, Cuff Size: Normal)   Pulse 94   Temp 97.5 F (36.4 C) (Oral)   Ht '5\' 6"'$  (1.676 m)   Wt 177 lb 11.2 oz (80.6 kg)   BMI 28.68 kg/m  Examination HEENT were negative except for bilateral cataracts juvenile type. Neck was supple no thyroid enlargement no adenopathy no carotid bruits cardiopulmonary exam normal except for scar midline from previous valvular heart disease surgery. Abdomen negative genitalia rectal done by urology therefore not repeated. Extremities normal skin normal peripheral pulses normal  except for scar left knee mid chest right hip and left shoulder from previous orthopedic procedures.  #1 history of gout continue allopurinol  #2 hypertension at goal....... continue current therapy  #3 urticaria etiology unknown........ continue prednisone 10 mg Monday Wednesday Friday  BPH...... continue Flomax  #5 sleep dysfunction....... continue trazodone  6 A. Fib,,,,,,, continue Coumadin  #7 status post aortic valve replacement

## 2016-09-11 NOTE — Patient Instructions (Signed)
Continue current medications  Call your insurance company and find out where you can get the shingles vaccine  Return in one year for general physical examination sooner if any problem

## 2016-10-09 IMAGING — CT CT L SPINE W/ CM
4 of 10 series · 11 of 33 positions shown, 13 images · non-contrast
Comparison: None

CLINICAL DATA: 78-year-old male with lower back pain. Initial
encounter.
TECHNIQUE: Contiguous axial images were obtained through the Lumbar spine after
the intrathecal infusion of infusion. Coronal and sagittal
reconstructions were obtained of the axial image sets.

[Series 4: l spine bone · axial · 0.27mm/px · z∈[-243,-153]mm · 2 of 108 slices shown]
[im 36/108  bone]
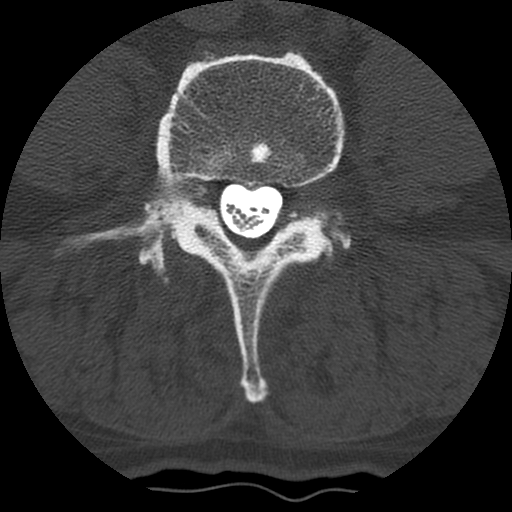
[im 72/108  bone]
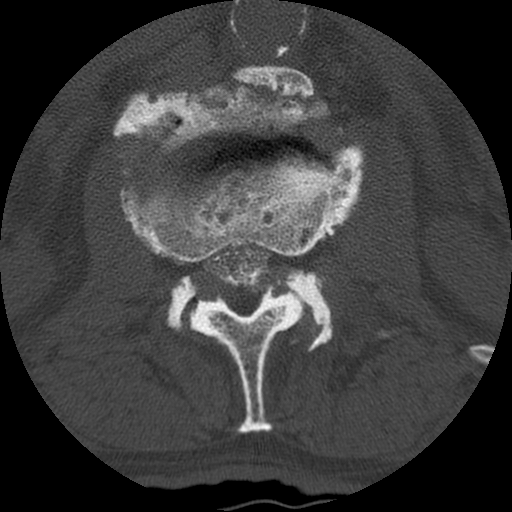

[Series 5: l spine detail · axial · 0.27mm/px · z∈[-266,-130]mm · 3 of 108 slices shown, 4 images]
[im 27/108  soft-tissue]
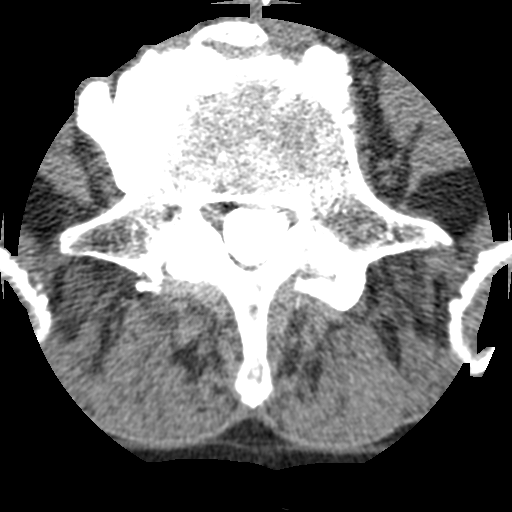
[im 27/108  bone]
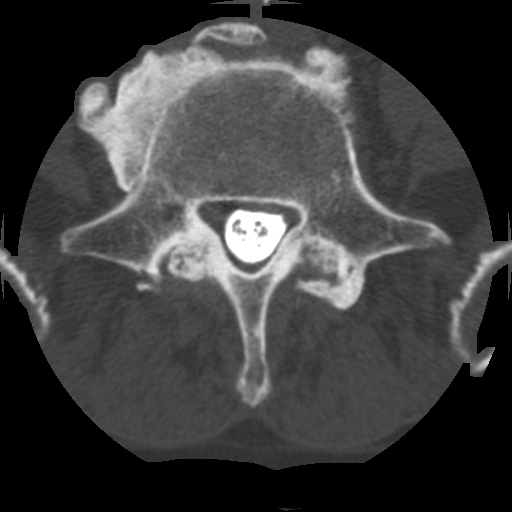
[im 54/108  bone]
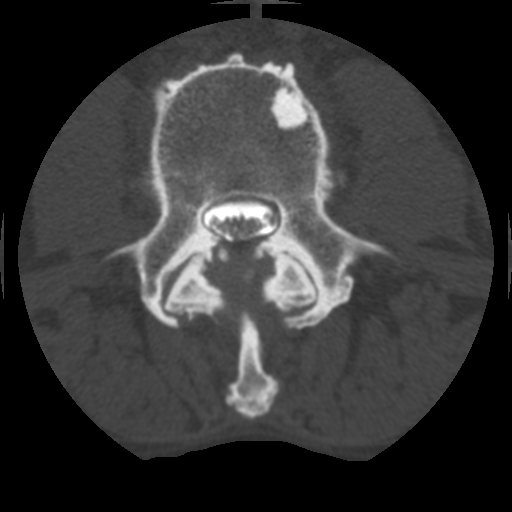
[im 81/108  bone]
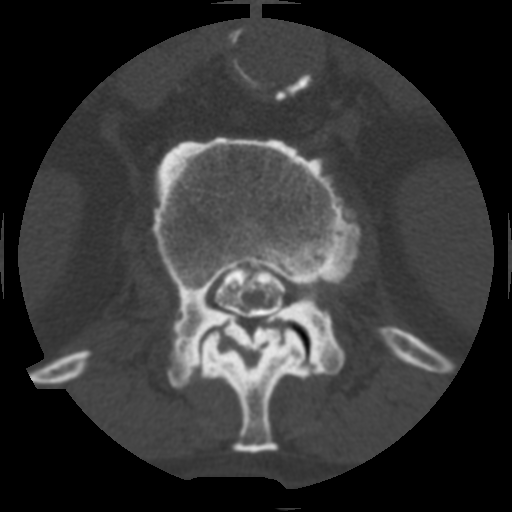

[Series 201: cor/lower · coronal · 0.31mm/px · 1 of 56 slices shown]
[im 28/56  bone]
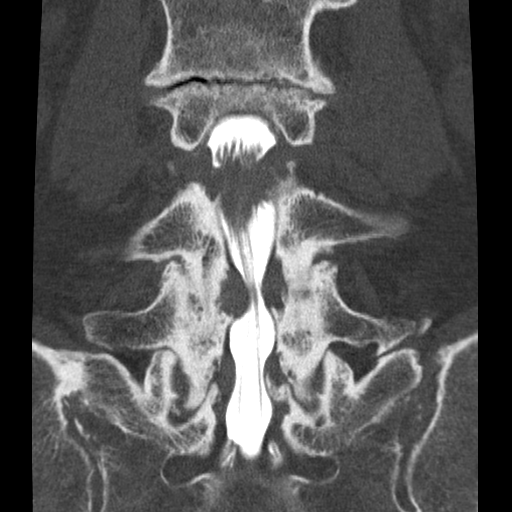

[Series 202: sag · sagittal · 0.54mm/px · 5 of 67 slices shown, 6 images]
[im 23/67  bone]
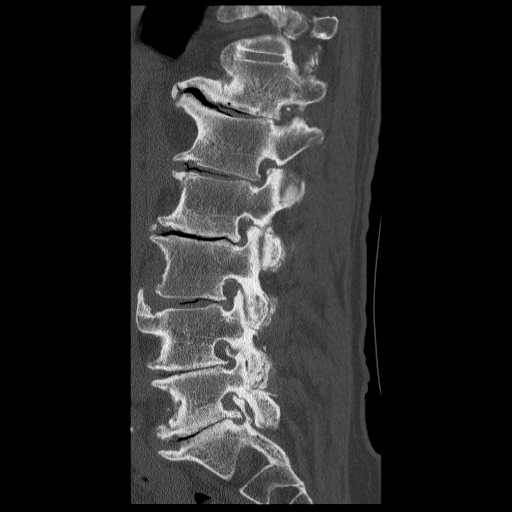
[im 28/67  bone]
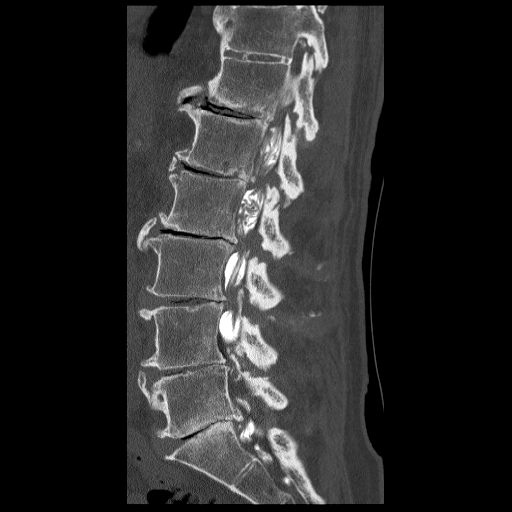
[im 34/67  soft-tissue]
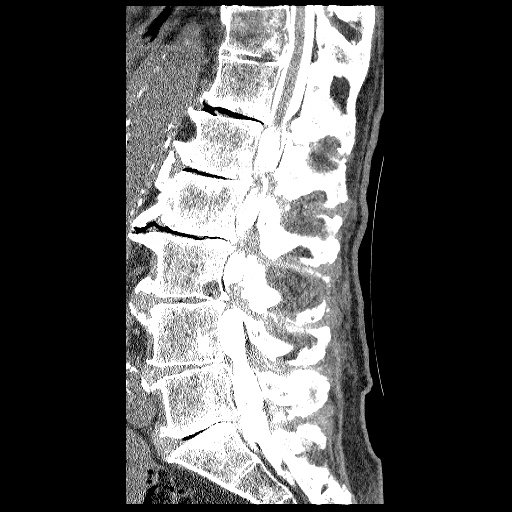
[im 34/67  bone]
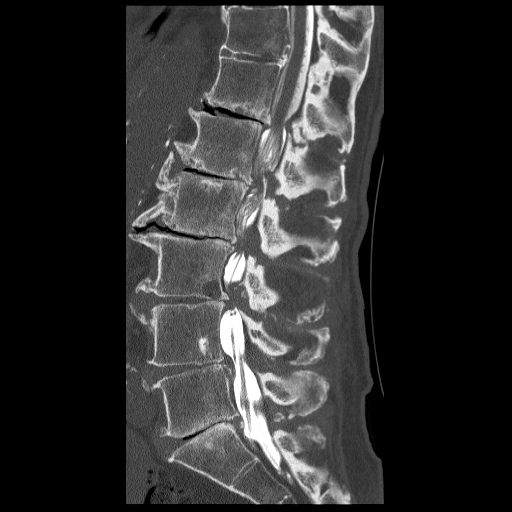
[im 39/67  bone]
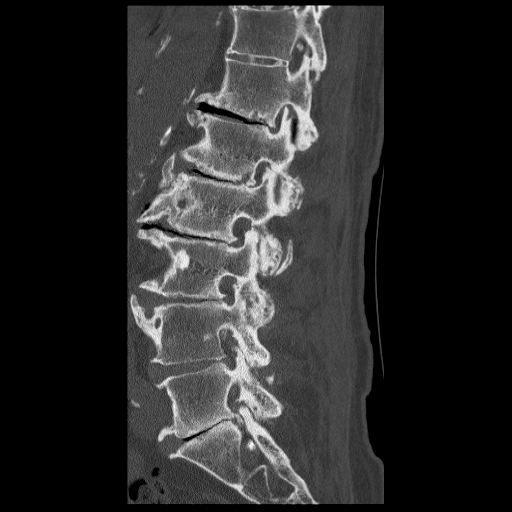
[im 45/67  bone]
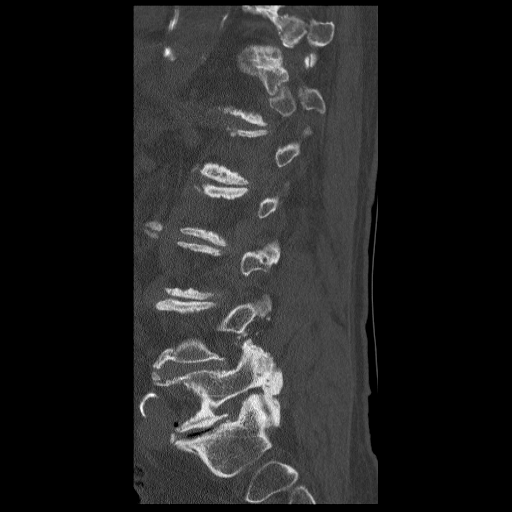

[11 of 33 positions shown; findings below may reference images not displayed]

EXAM:
LUMBAR MYELOGRAM

FLUOROSCOPY TIME:  2 minutes and 30 seconds, 57 dGy cm2

PROCEDURE:
After thorough discussion of risks and benefits of the procedure
including bleeding, infection, injury to nerves, blood vessels,
adjacent structures as well as headache and CSF leak, written and
oral informed consent was obtained. Consent was obtained by Dr.
Nala Tiger. Time out form was completed.

Patient was positioned prone on the fluoroscopy table. Local
anesthesia was provided with 1% lidocaine without epinephrine after
prepped and draped in the usual sterile fashion. Puncture was
performed at L3-4 using a 3 1/2 inch 22-gauge spinal needle via left
paramedian approach. No return of cerebral spinal fluid. L4-5 lumbar
puncture performed with a single pass of a 20 gauge spinal needle,
with return of clear CSF. 15 mL of Amnipaque-YWU was injected into
the thecal sac, with normal opacification of the nerve roots and
cauda equina consistent with free flow within the subarachnoid
space.

I personally performed the lumbar puncture and administered the
intrathecal contrast. I also personally supervised acquisition of
the myelogram images.
FINDINGS: LUMBAR MYELOGRAM FINDINGS:

Circumferential narrowing of the thecal sac L1-2 through L4-5 level.
At the L3-4 level, circumferential narrowing greater on the left. At
the L4-5 level, circumferentially narrowing greater on the right.

No abnormal motion occurs between flexion and extension.

CT LUMBAR MYELOGRAM FINDINGS:

Last fully open disk space is labeled L5-S1. Present examination
incorporates from T11 through S2-S3 level.

Conus lower L1 level just above the L1-2 disc space. Thickened nerve
roots below the conus felt to be related to spinal stenosis as
discussed below.

Scarring or atelectasis lung bases greater on right.

Atherosclerotic type changes of the lower thoracic/ abdominal aorta
and iliac arteries incompletely assessed on the present exam.

Sclerotic focus most notable left aspect L3 vertebral body with
smaller sclerotic focus left paracentral L4 vertebral body may
represent incidental findings. If the patient had a history of
prostate cancer, sclerotic metastatic lesions cannot be excluded.

Regions of lucencies involving supple vertebra most notable inferior
posterior L3 vertebral body probably related to subchondral cystic
changes.

T11-12: Fusion of disc space. Left paracentral small calcified
protrusion with cephalad spur/ ossification posterior longitudinal
ligament. Slight impression left ventral aspect of the thecal sac
with crowding the nerve roots. Facet joint degenerative changes.

T12-L1: Facet joint degenerative changes. Buckling of the posterior
ligaments. Ligamentum flavum hypertrophy. 2 mm retrolisthesis T12.
Prominent disc degeneration with broad-based disc osteophyte. Marked
spinal stenosis with crowding of the cord. Moderate right-sided and
marked left-sided foraminal narrowing.

L1-2: Facet joint degenerative changes. Ligamentum flavum
hypertrophy. Minimal retrolisthesis L1. Disc degeneration with bulge
and spur. Multifactorial marked to severe spinal stenosis and
moderate bilateral foraminal narrowing.

L2-3: Facet joint degenerative changes. Ligamentum flavum
hypertrophy. Broad-based disc osteophyte complex. Disc degeneration
with minimal retrolisthesis L2. Multifactorial marked to severe
spinal stenosis and moderate bilateral foraminal narrowing.

L3-4: Facet joint degenerative changes. Ligamentum flavum
hypertrophy greater on the left and partially calcified on the
right. Disc degeneration with bulge and spur. Multifactorial marked
to severe spinal stenosis greater on the left. Mild moderate
foraminal narrowing greater on the left.

L4-5: Facet joint degenerative changes and ligamentum flavum
hypertrophy greater on the right. Disc degeneration with bulge and
spur with minimal anterior slip of L4. Multifactorial marked lateral
recess stenosis greater on the right and moderate to marked spinal
stenosis greater on the right. Bilateral foraminal narrowing with
lateral extension of disc/osteophyte greater on right with
encroachment upon the exiting L4 nerve roots.

L5-S1: Facet joint degenerative changes. Ligamentum flavum
hypertrophy. Mild retrolisthesis L5. Disc degeneration with bulge
and spur. Multifactorial moderate lateral recess stenosis greater on
right. Mild to moderate spinal stenosis greatest in the left to
right direction. Lateral extension of disc/osteophyte causes
encroachment upon the exiting L5 nerve roots in a far lateral and
foraminal position.
IMPRESSION: LUMBAR MYELOGRAM IMPRESSION:

Circumferential narrowing of the thecal sac L1-2 through L4-5 level.
At the L3-4 level, circumferential narrowing greater on the left. At
the L4-5 level, circumferentially narrowing greater on the right.

No abnormal motion occurs between flexion and extension

CT LUMBAR MYELOGRAM IMPRESSION:

Summary of pertinent findings includes:

T11-12 left paracentral small calcified protrusion. Slight
impression left ventral aspect of the thecal sac with crowding the
nerve roots.

T12-L1 multifactorial marked spinal stenosis with crowding of the
cord. Moderate right-sided and marked left-sided foraminal
narrowing.

L1-2: Multifactorial marked to severe spinal stenosis and moderate
bilateral foraminal narrowing.

L2-3: Multifactorial marked to severe spinal stenosis and moderate
bilateral foraminal narrowing.

L3-4 multifactorial marked to severe spinal stenosis greater on the
left. Mild to moderate foraminal narrowing greater on the left.

L4-5 multifactorial marked lateral recess stenosis greater on the
right. Moderate to marked spinal stenosis greater on the right.
Bilateral foraminal narrowing with lateral extension of
disc/osteophyte greater on right with encroachment upon the exiting
L4 nerve roots.

L5-S1 multifactorial moderate lateral recess stenosis greater on
right. Mild to moderate spinal stenosis greatest in the left to
right direction. Lateral extension of disc/osteophyte causes
encroachment upon the exiting L5 nerve roots in a far lateral and
foraminal position.

Atherosclerotic type changes of the lower thoracic/ abdominal aorta
and iliac arteries incompletely assessed on the present exam.

Sclerotic focus most notable left aspect L3 vertebral body with
smaller sclerotic focus left paracentral L4 vertebral body may
represent incidental findings. If the patient had a history of
prostate cancer, sclerotic metastatic lesions cannot be excluded.

## 2016-10-11 ENCOUNTER — Ambulatory Visit (INDEPENDENT_AMBULATORY_CARE_PROVIDER_SITE_OTHER): Payer: Medicare Other | Admitting: General Practice

## 2016-10-11 DIAGNOSIS — I359 Nonrheumatic aortic valve disorder, unspecified: Secondary | ICD-10-CM

## 2016-10-11 DIAGNOSIS — Z5181 Encounter for therapeutic drug level monitoring: Secondary | ICD-10-CM

## 2016-10-11 DIAGNOSIS — I4891 Unspecified atrial fibrillation: Secondary | ICD-10-CM

## 2016-10-11 LAB — POCT INR: INR: 2.9

## 2016-10-11 NOTE — Progress Notes (Signed)
I have reviewed and agree with the plan. 

## 2016-10-11 NOTE — Patient Instructions (Signed)
Pre visit review using our clinic review tool, if applicable. No additional management support is needed unless otherwise documented below in the visit note. 

## 2016-10-31 ENCOUNTER — Other Ambulatory Visit: Payer: Self-pay | Admitting: General Practice

## 2016-10-31 ENCOUNTER — Other Ambulatory Visit: Payer: Self-pay | Admitting: Family Medicine

## 2016-10-31 MED ORDER — WARFARIN SODIUM 5 MG PO TABS
ORAL_TABLET | ORAL | 1 refills | Status: DC
Start: 2016-10-31 — End: 2017-05-03

## 2016-11-22 ENCOUNTER — Ambulatory Visit (INDEPENDENT_AMBULATORY_CARE_PROVIDER_SITE_OTHER): Payer: Medicare Other | Admitting: General Practice

## 2016-11-22 DIAGNOSIS — Z5181 Encounter for therapeutic drug level monitoring: Secondary | ICD-10-CM

## 2016-11-22 DIAGNOSIS — I359 Nonrheumatic aortic valve disorder, unspecified: Secondary | ICD-10-CM

## 2016-11-22 LAB — POCT INR: INR: 2.6

## 2016-11-22 NOTE — Patient Instructions (Signed)
Pre visit review using our clinic review tool, if applicable. No additional management support is needed unless otherwise documented below in the visit note. 

## 2017-01-03 ENCOUNTER — Ambulatory Visit (INDEPENDENT_AMBULATORY_CARE_PROVIDER_SITE_OTHER): Payer: Medicare Other | Admitting: General Practice

## 2017-01-03 DIAGNOSIS — I359 Nonrheumatic aortic valve disorder, unspecified: Secondary | ICD-10-CM

## 2017-01-03 DIAGNOSIS — Z5181 Encounter for therapeutic drug level monitoring: Secondary | ICD-10-CM | POA: Diagnosis not present

## 2017-01-03 DIAGNOSIS — I4891 Unspecified atrial fibrillation: Secondary | ICD-10-CM

## 2017-01-03 LAB — POCT INR: INR: 2.8

## 2017-01-03 NOTE — Progress Notes (Signed)
I have reviewed and agree with the plan. 

## 2017-01-03 NOTE — Patient Instructions (Signed)
Pre visit review using our clinic review tool, if applicable. No additional management support is needed unless otherwise documented below in the visit note. 

## 2017-01-31 DIAGNOSIS — M5431 Sciatica, right side: Secondary | ICD-10-CM | POA: Diagnosis not present

## 2017-01-31 DIAGNOSIS — M7581 Other shoulder lesions, right shoulder: Secondary | ICD-10-CM | POA: Diagnosis not present

## 2017-02-14 ENCOUNTER — Ambulatory Visit (INDEPENDENT_AMBULATORY_CARE_PROVIDER_SITE_OTHER): Payer: Medicare Other | Admitting: General Practice

## 2017-02-14 DIAGNOSIS — Z5181 Encounter for therapeutic drug level monitoring: Secondary | ICD-10-CM

## 2017-02-14 DIAGNOSIS — I4891 Unspecified atrial fibrillation: Secondary | ICD-10-CM

## 2017-02-14 DIAGNOSIS — I359 Nonrheumatic aortic valve disorder, unspecified: Secondary | ICD-10-CM

## 2017-02-14 LAB — POCT INR: INR: 4.9

## 2017-02-14 NOTE — Progress Notes (Signed)
I have reviewed and agree with the plan. 

## 2017-02-14 NOTE — Patient Instructions (Signed)
Pre visit review using our clinic review tool, if applicable. No additional management support is needed unless otherwise documented below in the visit note. 

## 2017-03-02 ENCOUNTER — Ambulatory Visit (INDEPENDENT_AMBULATORY_CARE_PROVIDER_SITE_OTHER): Payer: Medicare Other | Admitting: General Practice

## 2017-03-02 DIAGNOSIS — Z5181 Encounter for therapeutic drug level monitoring: Secondary | ICD-10-CM

## 2017-03-02 DIAGNOSIS — I359 Nonrheumatic aortic valve disorder, unspecified: Secondary | ICD-10-CM

## 2017-03-02 DIAGNOSIS — I4891 Unspecified atrial fibrillation: Secondary | ICD-10-CM

## 2017-03-02 LAB — POCT INR: INR: 3.4

## 2017-03-02 NOTE — Patient Instructions (Signed)
Pre visit review using our clinic review tool, if applicable. No additional management support is needed unless otherwise documented below in the visit note. 

## 2017-03-05 DIAGNOSIS — M5431 Sciatica, right side: Secondary | ICD-10-CM | POA: Diagnosis not present

## 2017-03-08 DIAGNOSIS — M545 Low back pain: Secondary | ICD-10-CM | POA: Diagnosis not present

## 2017-03-13 DIAGNOSIS — H43813 Vitreous degeneration, bilateral: Secondary | ICD-10-CM | POA: Diagnosis not present

## 2017-03-20 DIAGNOSIS — M5416 Radiculopathy, lumbar region: Secondary | ICD-10-CM | POA: Diagnosis not present

## 2017-03-20 DIAGNOSIS — M48061 Spinal stenosis, lumbar region without neurogenic claudication: Secondary | ICD-10-CM | POA: Diagnosis not present

## 2017-03-20 DIAGNOSIS — M545 Low back pain: Secondary | ICD-10-CM | POA: Diagnosis not present

## 2017-03-23 ENCOUNTER — Encounter: Payer: Self-pay | Admitting: *Deleted

## 2017-03-23 ENCOUNTER — Ambulatory Visit (INDEPENDENT_AMBULATORY_CARE_PROVIDER_SITE_OTHER): Payer: Medicare Other | Admitting: General Practice

## 2017-03-23 ENCOUNTER — Telehealth: Payer: Self-pay | Admitting: Cardiology

## 2017-03-23 DIAGNOSIS — Z5181 Encounter for therapeutic drug level monitoring: Secondary | ICD-10-CM | POA: Diagnosis not present

## 2017-03-23 DIAGNOSIS — I4891 Unspecified atrial fibrillation: Secondary | ICD-10-CM

## 2017-03-23 DIAGNOSIS — I359 Nonrheumatic aortic valve disorder, unspecified: Secondary | ICD-10-CM

## 2017-03-23 DIAGNOSIS — M5416 Radiculopathy, lumbar region: Secondary | ICD-10-CM | POA: Diagnosis not present

## 2017-03-23 LAB — POCT INR: INR: 2

## 2017-03-23 NOTE — Telephone Encounter (Signed)
03/23/2017 Received faxed office note from [facility].  Records given to [facility].  cbr

## 2017-03-23 NOTE — Progress Notes (Signed)
I have reviewed and agree with the plan. 

## 2017-03-23 NOTE — Telephone Encounter (Signed)
Patient aware and verbalized understanding. °

## 2017-03-23 NOTE — Telephone Encounter (Signed)
Returned call to patient-states he went to the pain management clinic for back pain and they would like to start him on neurontin.   States they informed him to contact Dr. Stanford Breed to see if this interferes with any of his medications, specifically coumadin.  Advised I would route to pharmacy for review.

## 2017-03-23 NOTE — Telephone Encounter (Signed)
This encounter was created in error - please disregard.

## 2017-03-23 NOTE — Telephone Encounter (Signed)
New Message  Pt would like to speak with RN about medication Neurontin. Please call back to discuss

## 2017-03-23 NOTE — Patient Instructions (Signed)
Pre visit review using our clinic review tool, if applicable. No additional management support is needed unless otherwise documented below in the visit note. 

## 2017-03-23 NOTE — Telephone Encounter (Signed)
Patient request note faxed to Dr. Mina Marble at [facility].

## 2017-03-23 NOTE — Telephone Encounter (Signed)
No significant drug-drug interactions noted between gabapentin (neurontin) and current therapy.  Advise patient to call his coumadin clinic (PCP) to notify changes in medication.

## 2017-03-26 ENCOUNTER — Telehealth: Payer: Self-pay | Admitting: Cardiology

## 2017-03-26 NOTE — Telephone Encounter (Signed)
Spoke with daughter Patient needing back injection with Dr. Mina Marble @ [facility] Patient needs to hold warfarin prior Will defer to MD/pharmacy staff for recommendations and call back/fax to Dr. Mina Marble

## 2017-03-26 NOTE — Telephone Encounter (Signed)
Agree with no bridge, hold coumadin 5 d prior to procedure and resume day of Kirk Ruths

## 2017-03-26 NOTE — Telephone Encounter (Signed)
Patient with aortic valve replacement (tissue) and atrial fibrillation, was bridged with lovenox for prior procedure in 2016.  CHADS2 score is 2 (age, hypertension).  CHADS2-VASc score of 3 (age x 2, hypertension).    Protocol recommends no bridge.   Daughter states Dr. Mina Marble, however [facility] states they don't have a Dr. Mina Marble.  Daughter to call and clarify.

## 2017-03-26 NOTE — Telephone Encounter (Signed)
Patient daughter [name]) calling states that [facility] faxed over a form for pateint on last Friday. Patient will have an injection in back and will have to stop coumadin. [name] asking for update, states that father was in "horrible pain this weekend."

## 2017-03-27 ENCOUNTER — Telehealth: Payer: Self-pay | Admitting: General Practice

## 2017-03-27 NOTE — Telephone Encounter (Signed)
Incoming call from patient to inform me that he will have a spinal injection on 8/7 at 9:00 and that he needs to hold coumadin 5 days prior.  Instructions were given to take last dose of coumadin on Thursday, 8/2.  Patient will have INR checked at the [place] office on Monday 8/6.  Patient verbalized understanding.

## 2017-03-27 NOTE — Telephone Encounter (Signed)
This note was faxed to [facility] @ [phone].

## 2017-03-30 ENCOUNTER — Ambulatory Visit: Payer: Medicare Other

## 2017-04-02 ENCOUNTER — Ambulatory Visit (INDEPENDENT_AMBULATORY_CARE_PROVIDER_SITE_OTHER): Payer: Medicare Other | Admitting: General Practice

## 2017-04-02 DIAGNOSIS — I4891 Unspecified atrial fibrillation: Secondary | ICD-10-CM | POA: Diagnosis not present

## 2017-04-02 DIAGNOSIS — Z5181 Encounter for therapeutic drug level monitoring: Secondary | ICD-10-CM | POA: Diagnosis not present

## 2017-04-02 DIAGNOSIS — I359 Nonrheumatic aortic valve disorder, unspecified: Secondary | ICD-10-CM | POA: Diagnosis not present

## 2017-04-02 LAB — POCT INR: INR: 1.2

## 2017-04-02 NOTE — Patient Instructions (Signed)
Pre visit review using our clinic review tool, if applicable. No additional management support is needed unless otherwise documented below in the visit note. 

## 2017-04-02 NOTE — Progress Notes (Signed)
I agree with this plan.

## 2017-04-03 DIAGNOSIS — M5416 Radiculopathy, lumbar region: Secondary | ICD-10-CM | POA: Diagnosis not present

## 2017-04-11 ENCOUNTER — Ambulatory Visit (INDEPENDENT_AMBULATORY_CARE_PROVIDER_SITE_OTHER): Payer: Medicare Other | Admitting: General Practice

## 2017-04-11 DIAGNOSIS — Z5181 Encounter for therapeutic drug level monitoring: Secondary | ICD-10-CM | POA: Diagnosis not present

## 2017-04-11 DIAGNOSIS — I359 Nonrheumatic aortic valve disorder, unspecified: Secondary | ICD-10-CM

## 2017-04-11 DIAGNOSIS — I4891 Unspecified atrial fibrillation: Secondary | ICD-10-CM | POA: Diagnosis not present

## 2017-04-11 LAB — POCT INR: INR: 3.5

## 2017-04-11 NOTE — Progress Notes (Signed)
I have reviewed and agree with the plan. 

## 2017-04-11 NOTE — Patient Instructions (Signed)
Pre visit review using our clinic review tool, if applicable. No additional management support is needed unless otherwise documented below in the visit note. 

## 2017-04-12 NOTE — Progress Notes (Signed)
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 Preoperative cardiac catheterization revealed normal coronary arteries. He has permanent atrial fibrillation. Previous carotid Dopplers due to bruits revealed 0-39% bilateral stenosis. A monitor in August of 2011 showed occasional 3.1 second pause. Tenoretic was discontinued and Norvasc added for his blood pressure. CTA in April of 2014 showed atheromatous thoracic aorta without aneurysm, dissection or stenosis. Echocardiogram May 2016 showed vigorous LV function. There was a prosthetic aortic valve with mean gradient 15 mmHg. There was mild mitral regurgitation, moderate left atrial enlargement, mild right ventricular enlargement, moderate right atrial enlargement and mild tricuspid regurgitation. Holter monitor October 2016 showed atrial fibrillation rate controlled. Since I last saw him patient is having significant back problems. He notes dyspnea on exertion possibly secondary to deconditioning. No orthopnea, PND, pedal edema, chest pain, palpitations or syncope. He had some ecchymosis over his inner thighs recently.  Current Outpatient Prescriptions  Medication Sig Dispense Refill  . allopurinol (ZYLOPRIM) 300 MG tablet Take 1 tablet (300 mg total) by mouth daily. 100 tablet 4  . amLODipine (NORVASC) 5 MG tablet Take 1 tablet (5 mg total) by mouth daily. 90 tablet 4  . furosemide (LASIX) 20 MG tablet TAKE ONE TABLET BY MOUTH ONCE DAILY AS NEEDED FOR  SWELLING 100 tablet 4  . predniSONE (DELTASONE) 20 MG tablet TAKE ONE-HALF TABLET BY MOUTH EVERY OTHER DAY 60 tablet 1  . tamsulosin (FLOMAX) 0.4 MG CAPS capsule Take 1 capsule (0.4 mg total) by mouth daily after breakfast. 100 capsule 5  . traMADol (ULTRAM) 50 MG tablet Take 50 mg by mouth 4 (four) times daily as needed.    . traZODone (DESYREL) 50 MG tablet TAKE ONE TABLET BY MOUTH AT BEDTIME AS NEEDED  90 tablet 4  . warfarin (COUMADIN) 5 MG tablet TAKE AS DIRECTED BY THE COUMADIN CLINIC 90 tablet 1   No current facility-administered medications for this visit.      Past Medical History:  Diagnosis Date  . Aortic stenosis   . Atrial fibrillation ([place])   . Baker cyst    Left knee  . DJD (degenerative joint disease)    knees  . Eczema   . Erectile dysfunction   . Gout   . HTN (hypertension)   . MVP (mitral valve prolapse)   . Shortness of breath dyspnea    with activity    Past Surgical History:  Procedure Laterality Date  .  polyps vocal cord  1994  . AORTIC VALVE REPLACEMENT  ~2004  . HERNIA REPAIR  ~1978  . HERNIA REPAIR  ~1983  . LUMBAR LAMINECTOMY/DECOMPRESSION MICRODISCECTOMY N/A 01/15/2015   Procedure: 2 LEVEL DECOMPRESSIVE LUMBAR LAMINECTOMY L3-L4,L4-L5;  Surgeon: Latanya Maudlin, MD;  Location: [facility];  Service: Orthopedics;  Laterality: N/A;  . right total hip  ~4 years ago  . ROTATOR CUFF REPAIR Left ~2009  . TOTAL KNEE ARTHROPLASTY Left 04/14/2015   Procedure: LEFT TOTAL KNEE ARTHROPLASTY;  Surgeon: Latanya Maudlin, MD;  Location: [facility];  Service: Orthopedics;  Laterality: Left;    Social History   Social History  . Marital status: Divorced    Spouse name: N/A  . Number of children: N/A  . Years of education: N/A   Occupational History  . Not on file.   Social History Main Topics  . Smoking status: Former Smoker    Years: 30.00    Types: Cigarettes  Quit date: 08/28/2002  . Smokeless tobacco: Never Used  . Alcohol use Yes     Comment: social  . Drug use: No  . Sexual activity: Not on file   Other Topics Concern  . Not on file   Social History Narrative  . No narrative on file    Family History  Problem Relation Age of Onset  . Diabetes Unknown   . Stroke Unknown     ROS: Sciatica but no fevers or chills, productive cough, hemoptysis, dysphasia, odynophagia, melena, hematochezia, dysuria, hematuria, rash, seizure activity, orthopnea,  PND, pedal edema, claudication. Remaining systems are negative.  Physical Exam: Well-developed well-nourished in no acute distress.  Skin is warm and dry. Ecchymosis over her extremities  HEENT is normal.  Neck is supple.  Chest is clear to auscultation with normal expansion.  Cardiovascular exam is irregular, 2/6 systolic murmur left sternal border. No diastolic murmur. Abdominal exam nontender or distended. No masses palpated. Extremities show no edema.  neuro grossly intact  ECG- Atrial fibrillation at a rate of 95. personally reviewed  A/P  1 Permanent atrial fibrillation-patient's heart rate is controlled on no AV nodal blocking agents. Continue Coumadin. Check hemoglobin.  2 status post aortic valve replacement-continue SBE prophylaxis. Given age of valve I will plan to repeat echocardiogram.  3 hypertension-blood pressure is reasonably well controlled. Continue present medications and follow. Check potassium and renal function.   Kirk Ruths, MD

## 2017-04-16 ENCOUNTER — Encounter: Payer: Self-pay | Admitting: Cardiology

## 2017-04-16 ENCOUNTER — Ambulatory Visit (INDEPENDENT_AMBULATORY_CARE_PROVIDER_SITE_OTHER): Payer: Medicare Other | Admitting: Cardiology

## 2017-04-16 VITALS — BP 142/70 | HR 95 | Ht 68.0 in | Wt 173.0 lb

## 2017-04-16 DIAGNOSIS — I10 Essential (primary) hypertension: Secondary | ICD-10-CM | POA: Diagnosis not present

## 2017-04-16 DIAGNOSIS — I482 Chronic atrial fibrillation: Secondary | ICD-10-CM | POA: Diagnosis not present

## 2017-04-16 DIAGNOSIS — Z952 Presence of prosthetic heart valve: Secondary | ICD-10-CM

## 2017-04-16 DIAGNOSIS — I4821 Permanent atrial fibrillation: Secondary | ICD-10-CM

## 2017-04-16 LAB — BASIC METABOLIC PANEL
BUN/Creatinine Ratio: 14 (ref 10–24)
BUN: 16 mg/dL (ref 8–27)
CO2: 27 mmol/L (ref 20–29)
Calcium: 9.4 mg/dL (ref 8.6–10.2)
Chloride: 97 mmol/L (ref 96–106)
Creatinine, Ser: 1.12 mg/dL (ref 0.76–1.27)
GFR calc Af Amer: 72 mL/min/{1.73_m2} (ref >59)
GFR calc non Af Amer: 62 mL/min/{1.73_m2} (ref >59)
Glucose: 84 mg/dL (ref 65–99)
Potassium: 4.2 mmol/L (ref 3.5–5.2)
Sodium: 139 mmol/L (ref 134–144)

## 2017-04-16 LAB — CBC
Hematocrit: 38.4 % (ref 37.5–51.0)
Hemoglobin: 13 g/dL (ref 13.0–17.7)
MCH: 32.3 pg (ref 26.6–33.0)
MCHC: 33.9 g/dL (ref 31.5–35.7)
MCV: 95 fL (ref 79–97)
Platelets: 254 10*3/uL (ref 150–379)
RBC: 4.03 x10E6/uL — ABNORMAL LOW (ref 4.14–5.80)
RDW: 13.5 % (ref 12.3–15.4)
WBC: 8.8 10*3/uL (ref 3.4–10.8)

## 2017-04-16 NOTE — Patient Instructions (Signed)
Medication Instructions:   NO CHANGE  Labwork:  Your physician recommends that you HAVE LAB WORK TODAY  Testing/Procedures:  Your physician has requested that you have an echocardiogram. Echocardiography is a painless test that uses sound waves to create images of your heart. It provides your doctor with information about the size and shape of your heart and how well your heart's chambers and valves are working. This procedure takes approximately one hour. There are no restrictions for this procedure.    Follow-Up:  Your physician wants you to follow-up in: 6 MONTHS WITH DR CRENSHAW You will receive a reminder letter in the mail two months in advance. If you don't receive a letter, please call our office to schedule the follow-up appointment.   If you need a refill on your cardiac medications before your next appointment, please call your pharmacy.    

## 2017-04-17 ENCOUNTER — Encounter: Payer: Self-pay | Admitting: *Deleted

## 2017-04-17 DIAGNOSIS — M5416 Radiculopathy, lumbar region: Secondary | ICD-10-CM | POA: Diagnosis not present

## 2017-04-17 DIAGNOSIS — M545 Low back pain: Secondary | ICD-10-CM | POA: Diagnosis not present

## 2017-04-17 DIAGNOSIS — M48061 Spinal stenosis, lumbar region without neurogenic claudication: Secondary | ICD-10-CM | POA: Diagnosis not present

## 2017-04-18 ENCOUNTER — Ambulatory Visit (HOSPITAL_COMMUNITY): Payer: Medicare Other | Attending: Cardiology

## 2017-04-18 ENCOUNTER — Other Ambulatory Visit: Payer: Self-pay

## 2017-04-18 DIAGNOSIS — I712 Thoracic aortic aneurysm, without rupture: Secondary | ICD-10-CM | POA: Insufficient documentation

## 2017-04-18 DIAGNOSIS — I119 Hypertensive heart disease without heart failure: Secondary | ICD-10-CM | POA: Diagnosis not present

## 2017-04-18 DIAGNOSIS — I081 Rheumatic disorders of both mitral and tricuspid valves: Secondary | ICD-10-CM | POA: Insufficient documentation

## 2017-04-18 DIAGNOSIS — Z953 Presence of xenogenic heart valve: Secondary | ICD-10-CM | POA: Insufficient documentation

## 2017-04-18 DIAGNOSIS — Z952 Presence of prosthetic heart valve: Secondary | ICD-10-CM | POA: Diagnosis not present

## 2017-04-18 DIAGNOSIS — Z87891 Personal history of nicotine dependence: Secondary | ICD-10-CM | POA: Insufficient documentation

## 2017-04-23 ENCOUNTER — Telehealth: Payer: Self-pay | Admitting: Cardiology

## 2017-04-23 ENCOUNTER — Other Ambulatory Visit: Payer: Self-pay | Admitting: Neurosurgery

## 2017-04-23 DIAGNOSIS — M48062 Spinal stenosis, lumbar region with neurogenic claudication: Secondary | ICD-10-CM | POA: Diagnosis not present

## 2017-04-23 DIAGNOSIS — I10 Essential (primary) hypertension: Secondary | ICD-10-CM | POA: Diagnosis not present

## 2017-04-23 NOTE — Telephone Encounter (Signed)
New message    Pt is calling asking for a call back from RN. He did not say what it was about.

## 2017-04-23 NOTE — Telephone Encounter (Signed)
Patient with diagnosis of atrial fibrillation on warfarin for anticoagulation.    Procedure: back surgery for compression on L1-L3 Date of procedure: 05/01/17  CHADS2 score of 2 (HTN, AGE);  CHADS2-VASc score of  3 (HTN, AGE x 2)  CrCl 59.4 Platelet count 254  Per office protocol, patient can hold warfarin for 5 days prior to procedure.   Patient will not need bridging with Lovenox (enoxaparin) around procedure.  If not bridging, patient should restart warfarin on the evening of procedure or day after, at discretion of procedure MD

## 2017-04-23 NOTE — Telephone Encounter (Signed)
Spoke with pt, he is having trouble with his sciatic and they are planning surgery for 05-01-17 and they need the patient off the warfarin 5 days prior. The family is calling asking for direction regarding pre and post surgery regarding warfarin. Will forward to anti-coag to advise the patient.

## 2017-04-24 NOTE — Pre-Procedure Instructions (Signed)
[name]  04/24/2017      [place], [address] [place] [address] Phone: [phone] Fax: [phone]  [place], [place] [address] [place] [address] Phone: [phone] Fax: [phone]    Your procedure is scheduled on   Tuesday  05/01/17  Report to [facility] Admitting at 1200 P.M.  Call this number if you have problems the morning of surgery:  [phone]   Remember:  Do not eat food or drink liquids after midnight.  Take these medicines the morning of surgery with A SIP OF WATER   ALLOPURINOL, AMLODIPINE (NORVASC), GABAPENTIN, HYDROCODONE IF NEEDED, PREDNISONE, TAMSULOSIN (FLOMAX)  7 days prior to surgery STOP taking any Aspirin, Aleve, Naproxen, Ibuprofen, Motrin, Advil, Goody's, BC's, all herbal medications, fish oil, and all vitamins    (STOP COUMADIN AS DIRECTED BY CARDIOLOGIST)     Do not wear jewelry, make-up or nail polish.  Do not wear lotions, powders, or perfumes, or deoderant.  Do not shave 48 hours prior to surgery.  Men may shave face and neck.  Do not bring valuables to the hospital.  [facility] is not responsible for any belongings or valuables.  Contacts, dentures or bridgework may not be worn into surgery.  Leave your suitcase in the car.  After surgery it may be brought to your room.  For patients admitted to the hospital, discharge time will be determined by your treatment team.  Patients discharged the day of surgery will not be allowed to drive home.   Name and phone number of your driver:    Special instructions:  [facility] - Preparing for Surgery  Before surgery, you can play an important role.  Because skin is not sterile, your skin needs to be as free of germs as possible.  You can reduce the number of germs on you skin by washing with CHG (chlorahexidine gluconate) soap before surgery.  CHG  is an antiseptic cleaner which kills germs and bonds with the skin to continue killing germs even after washing.  Please DO NOT use if you have an allergy to CHG or antibacterial soaps.  If your skin becomes reddened/irritated stop using the CHG and inform your nurse when you arrive at Short Stay.  Do not shave (including legs and underarms) for at least 48 hours prior to the first CHG shower.  You may shave your face.  Please follow these instructions carefully:   1.  Shower with CHG Soap the night before surgery and the                                morning of Surgery.  2.  If you choose to wash your hair, wash your hair first as usual with your       normal shampoo.  3.  After you shampoo, rinse your hair and body thoroughly to remove the                      Shampoo.  4.  Use CHG as you would any other liquid soap.  You can apply chg directly       to the skin and wash gently with scrungie or a clean washcloth.  5.  Apply the CHG Soap to your body ONLY FROM THE NECK DOWN.  Do not use on open wounds or open sores.  Avoid contact with your eyes,       ears, mouth and genitals (private parts).  Wash genitals (private parts)       with your normal soap.  6.  Wash thoroughly, paying special attention to the area where your surgery        will be performed.  7.  Thoroughly rinse your body with warm water from the neck down.  8.  DO NOT shower/wash with your normal soap after using and rinsing off       the CHG Soap.  9.  Pat yourself dry with a clean towel.            10.  Wear clean pajamas.            11.  Place clean sheets on your bed the night of your first shower and do not        sleep with pets.  Day of Surgery  Do not apply any lotions/deoderants the morning of surgery.  Please wear clean clothes to the hospital/surgery center.    Please read over the following fact sheets that you were given. MRSA Information and Surgical Site Infection Prevention

## 2017-04-25 ENCOUNTER — Encounter (HOSPITAL_COMMUNITY): Payer: Self-pay

## 2017-04-25 ENCOUNTER — Encounter (HOSPITAL_COMMUNITY)
Admission: RE | Admit: 2017-04-25 | Discharge: 2017-04-25 | Disposition: A | Discharge status: Home / Self Care | Payer: Medicare Other | Source: Ambulatory Visit | Attending: Neurosurgery | Admitting: Neurosurgery

## 2017-04-25 DIAGNOSIS — M48062 Spinal stenosis, lumbar region with neurogenic claudication: Secondary | ICD-10-CM | POA: Insufficient documentation

## 2017-04-25 DIAGNOSIS — Z01818 Encounter for other preprocedural examination: Secondary | ICD-10-CM | POA: Insufficient documentation

## 2017-04-25 HISTORY — DX: Cardiac arrhythmia, unspecified: I49.9

## 2017-04-25 LAB — BASIC METABOLIC PANEL
Anion gap: 7 (ref 5–15)
BUN: 20 mg/dL (ref 6–20)
CO2: 29 mmol/L (ref 22–32)
Calcium: 9.1 mg/dL (ref 8.9–10.3)
Chloride: 103 mmol/L (ref 101–111)
Creatinine, Ser: 0.89 mg/dL (ref 0.61–1.24)
GFR calc Af Amer: >60 mL/min (ref >60)
GFR calc non Af Amer: >60 mL/min (ref >60)
Glucose, Bld: 107 mg/dL — ABNORMAL HIGH (ref 65–99)
Potassium: 3.7 mmol/L (ref 3.5–5.1)
Sodium: 139 mmol/L (ref 135–145)

## 2017-04-25 LAB — SURGICAL PCR SCREEN
MRSA, PCR: NEGATIVE
Staphylococcus aureus: NEGATIVE

## 2017-04-25 LAB — CBC
HCT: 38.2 % — ABNORMAL LOW (ref 39.0–52.0)
Hemoglobin: 12.7 g/dL — ABNORMAL LOW (ref 13.0–17.0)
MCH: 33 pg (ref 26.0–34.0)
MCHC: 33.2 g/dL (ref 30.0–36.0)
MCV: 99.2 fL (ref 78.0–100.0)
Platelets: 192 10*3/uL (ref 150–400)
RBC: 3.85 MIL/uL — ABNORMAL LOW (ref 4.22–5.81)
RDW: 13.4 % (ref 11.5–15.5)
WBC: 7.8 10*3/uL (ref 4.0–10.5)

## 2017-04-25 NOTE — Progress Notes (Addendum)
Requested cardiac clearance from Dr. Cleotilde Neer office, [name] will fax.  Patient and his daughter both stated that his last dose of coumadin was 04/25/17.

## 2017-04-25 NOTE — Progress Notes (Signed)
Anesthesia Chart Review: Patient is a 81 year old male scheduled for L1-2, L3-4, L4-5, L5-S1 laminectomy on 05/01/17 by Dr. Kathyrn Sheriff.  History includes former smoker (quit '04), afib, HTN, aortic stenosis s/p AV replacement (Edwards pericardial tissue, 21 mm) and TAA repair (homograft aortic root replacement 03/22/05, MVP, exertional dyspnea, eczema, right THA, left TKA 04/14/15, vocal cord polyps '94, L3-5 laminectomy 01/15/15, hernia repair. In 03/2010 he had a 3.1 second pause on monitor and Tenoretic was discontinued. Only luminal irregularities on LHC in 2006.   - PCP is listed as Dr. Stevie Kern. - Cardiologist is Dr. Kirk Ruths, last visit 04/16/17. Continue SBE prophylaxis given AVR history. Echo repeated which showed stable findings. Six month follow-up planned. No Lovenox bridge while warfarin on hold per [facility] pharmacist.  Meds include allopurinol, amlodipine, Lasix PRN, Neurontin, Norco, iron, Zanaflex, trazodone, Flomax, prednisone (has been on for urticaria of unknown etiology with recurrent hives when tried to wean), warfarin (last dose 04/25/17)  BP (!) 132/58   Pulse 74   Temp 36.6 C   Resp 20   Ht 5\' 8"  (1.727 m)   Wt 172 lb 8 oz (78.2 kg)   SpO2 98%   BMI 26.23 kg/m   Echo 04/18/17: Study Conclusions - Left ventricle: The cavity size was normal. Systolic function was   vigorous. The estimated ejection fraction was in the range of 65%   to 70%. Wall motion was normal; there were no regional wall   motion abnormalities. - Aortic valve: S/P AVR with a bioprosthetic valve. Mean gradient   (S): 18 mm Hg. Peak gradient (S): 33 mm Hg. Valve area (VTI):   1.66 cm^2. Valve area (Vmax): 1.37 cm^2. Valve area (Vmean): 1.3   cm^2. - Aortic root: The aortic root was normal in size. - Ascending aorta: The ascending aorta was normal in size. - Mitral valve: Calcified annulus. Mildly thickened leaflets . - Left atrium: The atrium was moderately dilated. - Right ventricle:  The cavity size was mildly dilated. Wall   thickness was normal. Systolic function was mildly reduced. - Right atrium: The atrium was moderately dilated. - Tricuspid valve: There was mild regurgitation. - Pulmonary arteries: Systolic pressure was moderately increased.   PA peak pressure: 46 mm Hg (S). - Inferior vena cava: The vessel was normal in size. - Pericardium, extracardiac: There was no pericardial effusion. Impressions: - S/P pericardial tissue valve and thoracic aortic aneurysm repair,   transaortic gradients unchanged from 12/29/2014 and still within   upper normal range with peak/mean gradients 33/18 mmHg (v max   2.87 m/s).   Mild RV dilatation and systolic dyfunction, moderate pulmonary   hypertension, unchanged from prior study.  EKG 04/16/17: Afib, rSr pattern in V1.  24-hour Holter Monitor 06/15/15: Highlights: Afib with pvcs or aberrantly conducted beats, rate controlled.  Cardiac cath (PRE-AVR) 03/10/05:  CORONARIES: 1.  LEFT MAIN:  Normal. 2.  LEFT ANTERIOR DESCENDING ARTERY:  Had mild luminal irregularities.  The first and second diagonals were small and normal. 3.  CIRCUMFLEX:  In the AV groove is normal.  The first obtuse marginal was small and normal.  The second obtuse marginal was large, branching and normal. 4.  RIGHT CORONARY ARTERY:  A large dominant vessel and normal.  PDA was moderate size and normal.  Posterolateral was moderate size and normal. LEFT VENTRICULOGRAPHY:  The left ventricle I was unable to cross the aortic valve and did not probe extensively. IMPRESSION:  Minimal coronary plaque.  The patient with  previously known aortic stenosis and ascending aortic root dilatation. PLAN:  The patient is to follow with Dr. Roxy Manns for planned aortic valve replacement and root replacement.  Preoperative labs noted. Cr 0.89. Glucose 107. H/H 12.7/38.2. PLT 192. He is for PT/INR on the day of surgery.   Patient was seen by Dr. Stanford Breed just last week. Echo was  stable. His office has provided perioperative warfarin instructions. If no acute changes then I anticipate that he can proceed as planned.  George [facility] Stay [facility]/Anesthesiology Phone [phone] 04/25/2017 9:25 PM

## 2017-04-27 ENCOUNTER — Telehealth: Payer: Self-pay | Admitting: Cardiology

## 2017-04-27 NOTE — Telephone Encounter (Signed)
Dr. Stanford Breed, can you review?  You saw him on 8/20, recommended echo, which he had done on 8/22.  Surgeon's office already has instructions for his coumadin hold, but needs a note (chart note in this encounter is OK) from you indicating he is safe to proceed w surgery. It is scheduled for this coming Tuesday.  Request for surgical clearance:  1. What type of surgery is being performed? 4-level laminectomy  2. When is this surgery scheduled? 05/01/17  3. Are there any medications that need to be held prior to surgery and how long? Coumadin - already received pharmD instructions.  4. Name of physician performing surgery? Dr. Kathyrn Sheriff  5. What is your office phone and fax number? Phone: [phone] Fax: [phone]

## 2017-04-27 NOTE — Telephone Encounter (Signed)
Called [name] to follow up, she voiced confirmation that the fax was received and she has forwarded documentation to hospital. No further needs identified - she stated thanks for call.

## 2017-04-27 NOTE — Telephone Encounter (Signed)
New message      Calling to let us know they received the coumadin dosing form.  However, they need the clearance form faxed to Dr Stanford Breed.  Please fax form to [phone]

## 2017-04-27 NOTE — Telephone Encounter (Signed)
Electronically faxed to Dr. Cleotilde Neer office.

## 2017-04-27 NOTE — Telephone Encounter (Signed)
Dc coumadin 5 days prior to procedure and resume after when ok with surgery; pt may proceed with surgery Kirk Ruths

## 2017-04-30 NOTE — Anesthesia Preprocedure Evaluation (Signed)
Anesthesia Evaluation  Patient identified by MRN, date of birth, ID band Patient awake    Reviewed: Allergy & Precautions, NPO status , Patient's Chart, lab work & pertinent test results  Airway Mallampati: II  TM Distance: >3 FB Neck ROM: Full    Dental no notable dental hx.    Pulmonary shortness of breath, former smoker,    Pulmonary exam normal breath sounds clear to auscultation       Cardiovascular hypertension, Pt. on medications + dysrhythmias Atrial Fibrillation  Rhythm:Regular Rate:Normal + Systolic murmurs Echo 09/7251 - Left ventricle: The cavity size was normal. Systolic function was vigorous. The estimated ejection fraction was in the range of 65% to 70%. Wall motion was normal; there were no regional wall motion abnormalities. - Aortic valve: S/P AVR with a bioprosthetic valve. Mean gradient (S): 18 mm Hg. Peak gradient (S): 33 mm Hg. Valve area (VTI): 1.66 cm^2. Valve area (Vmax): 1.37 cm^2. Valve area (Vmean): 1.3 cm^2. - Aortic root: The aortic root was normal in size. - Ascending aorta: The ascending aorta was normal in size. - Mitral valve: Calcified annulus. Mildly thickened leaflets . - Left atrium: The atrium was moderately dilated. - Right ventricle: The cavity size was mildly dilated. Wall thickness was normal. Systolic function was mildly reduced. - Right atrium: The atrium was moderately dilated. - Tricuspid valve: There was mild regurgitation. - Pulmonary arteries: Systolic pressure was moderately increased. PA peak pressure: 46 mm Hg (S). - Inferior vena cava: The vessel was normal in size. - Pericardium, extracardiac: There was no pericardial effusion.  Impressions: - S/P pericardial tissue valve and thoracic aortic aneurysm repair, transaortic gradients unchanged from 12/29/2014 and still within upper normal range with peak/mean gradients 33/18 mmHg (v max 2.87 m/s).  Mild RV dilatation and systolic  dyfunction, moderate pulmonary hypertension, unchanged from prior study.    Neuro/Psych negative neurological ROS  negative psych ROS   GI/Hepatic negative GI ROS, Neg liver ROS,   Endo/Other  negative endocrine ROS  Renal/GU negative Renal ROS  negative genitourinary   Musculoskeletal negative musculoskeletal ROS (+) Arthritis ,   Abdominal   Peds negative pediatric ROS (+)  Hematology   Anesthesia Other Findings   Reproductive/Obstetrics negative OB ROS                             Anesthesia Physical  Anesthesia Plan  ASA: III  Anesthesia Plan: General   Post-op Pain Management:    Induction: Intravenous  PONV Risk Score and Plan: 3 and Ondansetron, Dexamethasone, Midazolam and Propofol infusion  Airway Management Planned: Oral ETT  Additional Equipment:   Intra-op Plan:   Post-operative Plan: Extubation in OR  Informed Consent: I have reviewed the patients History and Physical, chart, labs and discussed the procedure including the risks, benefits and alternatives for the proposed anesthesia with the patient or authorized representative who has indicated his/her understanding and acceptance.   Dental advisory given  Plan Discussed with: CRNA and Surgeon  Anesthesia Plan Comments:         Anesthesia Quick Evaluation

## 2017-05-01 ENCOUNTER — Encounter (HOSPITAL_COMMUNITY)
Admission: AD | Disposition: A | Discharge status: Skilled Nursing Facility | Payer: Self-pay | Source: Ambulatory Visit | Attending: Neurosurgery

## 2017-05-01 ENCOUNTER — Ambulatory Visit (HOSPITAL_COMMUNITY): Payer: Medicare Other | Admitting: Vascular Surgery

## 2017-05-01 ENCOUNTER — Ambulatory Visit (HOSPITAL_COMMUNITY): Payer: Medicare Other | Admitting: Certified Registered Nurse Anesthetist

## 2017-05-01 ENCOUNTER — Encounter (HOSPITAL_COMMUNITY): Payer: Self-pay | Admitting: Urology

## 2017-05-01 ENCOUNTER — Inpatient Hospital Stay (HOSPITAL_COMMUNITY)
Admission: AD | Admit: 2017-05-01 | Discharge: 2017-05-03 | DRG: 518 | Disposition: A | Discharge status: Skilled Nursing Facility | Payer: Medicare Other | Source: Ambulatory Visit | Attending: Neurosurgery | Admitting: Neurosurgery

## 2017-05-01 ENCOUNTER — Ambulatory Visit (HOSPITAL_COMMUNITY): Payer: Medicare Other

## 2017-05-01 DIAGNOSIS — Z87891 Personal history of nicotine dependence: Secondary | ICD-10-CM | POA: Diagnosis not present

## 2017-05-01 DIAGNOSIS — Z952 Presence of prosthetic heart valve: Secondary | ICD-10-CM | POA: Diagnosis not present

## 2017-05-01 DIAGNOSIS — Z79899 Other long term (current) drug therapy: Secondary | ICD-10-CM | POA: Diagnosis not present

## 2017-05-01 DIAGNOSIS — I10 Essential (primary) hypertension: Secondary | ICD-10-CM | POA: Diagnosis not present

## 2017-05-01 DIAGNOSIS — G9519 Other vascular myelopathies: Secondary | ICD-10-CM | POA: Diagnosis present

## 2017-05-01 DIAGNOSIS — Z993 Dependence on wheelchair: Secondary | ICD-10-CM

## 2017-05-01 DIAGNOSIS — M109 Gout, unspecified: Secondary | ICD-10-CM | POA: Diagnosis not present

## 2017-05-01 DIAGNOSIS — I4891 Unspecified atrial fibrillation: Secondary | ICD-10-CM | POA: Diagnosis present

## 2017-05-01 DIAGNOSIS — Z7901 Long term (current) use of anticoagulants: Secondary | ICD-10-CM

## 2017-05-01 DIAGNOSIS — I341 Nonrheumatic mitral (valve) prolapse: Secondary | ICD-10-CM | POA: Diagnosis not present

## 2017-05-01 DIAGNOSIS — M48061 Spinal stenosis, lumbar region without neurogenic claudication: Secondary | ICD-10-CM | POA: Diagnosis not present

## 2017-05-01 DIAGNOSIS — Z791 Long term (current) use of non-steroidal anti-inflammatories (NSAID): Secondary | ICD-10-CM

## 2017-05-01 DIAGNOSIS — M48062 Spinal stenosis, lumbar region with neurogenic claudication: Secondary | ICD-10-CM | POA: Diagnosis not present

## 2017-05-01 DIAGNOSIS — M545 Low back pain: Secondary | ICD-10-CM | POA: Diagnosis not present

## 2017-05-01 DIAGNOSIS — I350 Nonrheumatic aortic (valve) stenosis: Secondary | ICD-10-CM | POA: Diagnosis not present

## 2017-05-01 DIAGNOSIS — M170 Bilateral primary osteoarthritis of knee: Secondary | ICD-10-CM | POA: Diagnosis not present

## 2017-05-01 DIAGNOSIS — M5416 Radiculopathy, lumbar region: Principal | ICD-10-CM | POA: Diagnosis present

## 2017-05-01 DIAGNOSIS — Z96652 Presence of left artificial knee joint: Secondary | ICD-10-CM | POA: Diagnosis present

## 2017-05-01 DIAGNOSIS — Z7952 Long term (current) use of systemic steroids: Secondary | ICD-10-CM | POA: Diagnosis not present

## 2017-05-01 DIAGNOSIS — R2689 Other abnormalities of gait and mobility: Secondary | ICD-10-CM | POA: Diagnosis not present

## 2017-05-01 DIAGNOSIS — Z9889 Other specified postprocedural states: Secondary | ICD-10-CM | POA: Diagnosis not present

## 2017-05-01 DIAGNOSIS — Z419 Encounter for procedure for purposes other than remedying health state, unspecified: Secondary | ICD-10-CM

## 2017-05-01 DIAGNOSIS — Z4789 Encounter for other orthopedic aftercare: Secondary | ICD-10-CM | POA: Diagnosis not present

## 2017-05-01 DIAGNOSIS — R5381 Other malaise: Secondary | ICD-10-CM | POA: Diagnosis not present

## 2017-05-01 DIAGNOSIS — M6281 Muscle weakness (generalized): Secondary | ICD-10-CM | POA: Diagnosis not present

## 2017-05-01 DIAGNOSIS — S34102A Unspecified injury to L2 level of lumbar spinal cord, initial encounter: Secondary | ICD-10-CM | POA: Diagnosis not present

## 2017-05-01 DIAGNOSIS — I621 Nontraumatic extradural hemorrhage: Secondary | ICD-10-CM | POA: Diagnosis present

## 2017-05-01 HISTORY — PX: LUMBAR LAMINECTOMY/DECOMPRESSION MICRODISCECTOMY: SHX5026

## 2017-05-01 LAB — PROTIME-INR
INR: 0.92
Prothrombin Time: 12.3 seconds (ref 11.4–15.2)

## 2017-05-01 SURGERY — LUMBAR LAMINECTOMY/DECOMPRESSION MICRODISCECTOMY 4 LEVEL
Anesthesia: General | Site: Back

## 2017-05-01 MED ORDER — DOCUSATE SODIUM 100 MG PO CAPS
100.0000 mg | ORAL_CAPSULE | Freq: Two times a day (BID) | ORAL | Status: DC
  Administered 2017-05-01 – 2017-05-03 (×4): 100 mg via ORAL
  Filled 2017-05-01 (×4): qty 1

## 2017-05-01 MED ORDER — BUPIVACAINE HCL (PF) 0.5 % IJ SOLN
INTRAMUSCULAR | Status: AC
  Filled 2017-05-01: qty 30

## 2017-05-01 MED ORDER — DEXAMETHASONE SODIUM PHOSPHATE 10 MG/ML IJ SOLN
INTRAMUSCULAR | Status: DC | PRN
  Administered 2017-05-01: 10 mg via INTRAVENOUS

## 2017-05-01 MED ORDER — ONDANSETRON HCL 4 MG/2ML IJ SOLN
INTRAMUSCULAR | Status: DC | PRN
  Administered 2017-05-01: 4 mg via INTRAVENOUS

## 2017-05-01 MED ORDER — CEFAZOLIN SODIUM-DEXTROSE 2-4 GM/100ML-% IV SOLN
2.0000 g | Freq: Three times a day (TID) | INTRAVENOUS | Status: AC
  Administered 2017-05-01 – 2017-05-02 (×2): 2 g via INTRAVENOUS
  Filled 2017-05-01 (×2): qty 100

## 2017-05-01 MED ORDER — TIZANIDINE HCL 4 MG PO TABS: 4.0000 mg | ORAL_TABLET | Freq: Four times a day (QID) | ORAL | Status: DC | PRN

## 2017-05-01 MED ORDER — PROPOFOL 10 MG/ML IV BOLUS
INTRAVENOUS | Status: DC | PRN
  Administered 2017-05-01: 100 mg via INTRAVENOUS
  Administered 2017-05-01: 50 mg via INTRAVENOUS

## 2017-05-01 MED ORDER — GABAPENTIN 300 MG PO CAPS
300.0000 mg | ORAL_CAPSULE | Freq: Two times a day (BID) | ORAL | Status: DC
  Administered 2017-05-01 – 2017-05-03 (×4): 300 mg via ORAL
  Filled 2017-05-01 (×4): qty 1

## 2017-05-01 MED ORDER — THROMBIN 5000 UNITS EX SOLR
OROMUCOSAL | Status: DC | PRN
  Administered 2017-05-01: 5 mL via TOPICAL

## 2017-05-01 MED ORDER — LIDOCAINE 2% (20 MG/ML) 5 ML SYRINGE
INTRAMUSCULAR | Status: DC | PRN
  Administered 2017-05-01: 100 mg via INTRAVENOUS

## 2017-05-01 MED ORDER — ALBUMIN HUMAN 5 % IV SOLN
INTRAVENOUS | Status: DC | PRN
  Administered 2017-05-01: 12:00:00 via INTRAVENOUS

## 2017-05-01 MED ORDER — METHYLPREDNISOLONE ACETATE 80 MG/ML IJ SUSP
INTRAMUSCULAR | Status: AC
  Filled 2017-05-01: qty 1

## 2017-05-01 MED ORDER — ROCURONIUM BROMIDE 10 MG/ML (PF) SYRINGE
PREFILLED_SYRINGE | INTRAVENOUS | Status: AC
  Filled 2017-05-01: qty 5

## 2017-05-01 MED ORDER — OXYCODONE HCL 5 MG PO TABS
5.0000 mg | ORAL_TABLET | Freq: Once | ORAL | Status: AC | PRN
  Administered 2017-05-01: 5 mg via ORAL

## 2017-05-01 MED ORDER — DEXAMETHASONE SODIUM PHOSPHATE 10 MG/ML IJ SOLN
INTRAMUSCULAR | Status: AC
  Filled 2017-05-01: qty 1

## 2017-05-01 MED ORDER — LIDOCAINE-EPINEPHRINE 1 %-1:100000 IJ SOLN
INTRAMUSCULAR | Status: AC
  Filled 2017-05-01: qty 1

## 2017-05-01 MED ORDER — ARTIFICIAL TEARS OPHTHALMIC OINT
TOPICAL_OINTMENT | OPHTHALMIC | Status: DC | PRN
  Administered 2017-05-01: 1 via OPHTHALMIC

## 2017-05-01 MED ORDER — PROPOFOL 10 MG/ML IV BOLUS
INTRAVENOUS | Status: AC
  Filled 2017-05-01: qty 20

## 2017-05-01 MED ORDER — ARTIFICIAL TEARS OPHTHALMIC OINT
TOPICAL_OINTMENT | OPHTHALMIC | Status: AC
  Filled 2017-05-01: qty 3.5

## 2017-05-01 MED ORDER — ACETAMINOPHEN 325 MG PO TABS: 650.0000 mg | ORAL_TABLET | ORAL | Status: DC | PRN

## 2017-05-01 MED ORDER — LIDOCAINE 2% (20 MG/ML) 5 ML SYRINGE
INTRAMUSCULAR | Status: AC
  Filled 2017-05-01: qty 5

## 2017-05-01 MED ORDER — OXYCODONE HCL 5 MG PO TABS
ORAL_TABLET | ORAL | Status: AC
  Filled 2017-05-01: qty 1

## 2017-05-01 MED ORDER — METHOCARBAMOL 1000 MG/10ML IJ SOLN
500.0000 mg | Freq: Four times a day (QID) | INTRAVENOUS | Status: DC | PRN
  Filled 2017-05-01 (×2): qty 5

## 2017-05-01 MED ORDER — SENNA 8.6 MG PO TABS
1.0000 | ORAL_TABLET | Freq: Two times a day (BID) | ORAL | Status: DC
  Administered 2017-05-01 – 2017-05-03 (×4): 8.6 mg via ORAL
  Filled 2017-05-01 (×4): qty 1

## 2017-05-01 MED ORDER — PREDNISONE 5 MG PO TABS
10.0000 mg | ORAL_TABLET | Freq: Every day | ORAL | Status: DC
  Administered 2017-05-02 – 2017-05-03 (×2): 10 mg via ORAL
  Filled 2017-05-01 (×2): qty 2

## 2017-05-01 MED ORDER — MENTHOL 3 MG MT LOZG: 1.0000 | LOZENGE | OROMUCOSAL | Status: DC | PRN

## 2017-05-01 MED ORDER — SODIUM CHLORIDE 0.9% FLUSH: 3.0000 mL | INTRAVENOUS | Status: DC | PRN

## 2017-05-01 MED ORDER — PHENOL 1.4 % MT LIQD: 1.0000 | OROMUCOSAL | Status: DC | PRN

## 2017-05-01 MED ORDER — ONDANSETRON HCL 4 MG/2ML IJ SOLN: 4.0000 mg | Freq: Four times a day (QID) | INTRAMUSCULAR | Status: DC | PRN

## 2017-05-01 MED ORDER — CHLORHEXIDINE GLUCONATE CLOTH 2 % EX PADS: 6.0000 | MEDICATED_PAD | Freq: Once | CUTANEOUS | Status: DC

## 2017-05-01 MED ORDER — KETOROLAC TROMETHAMINE 15 MG/ML IJ SOLN
INTRAMUSCULAR | Status: AC
  Administered 2017-05-01: 15 mg via INTRAVENOUS
  Filled 2017-05-01: qty 1

## 2017-05-01 MED ORDER — HYDROMORPHONE HCL 1 MG/ML IJ SOLN: 0.2500 mg | INTRAMUSCULAR | Status: DC | PRN

## 2017-05-01 MED ORDER — OXYCODONE HCL 5 MG/5ML PO SOLN: 5.0000 mg | Freq: Once | ORAL | Status: AC | PRN

## 2017-05-01 MED ORDER — FENTANYL CITRATE (PF) 250 MCG/5ML IJ SOLN
INTRAMUSCULAR | Status: DC | PRN
  Administered 2017-05-01: 150 ug via INTRAVENOUS

## 2017-05-01 MED ORDER — TAMSULOSIN HCL 0.4 MG PO CAPS
0.4000 mg | ORAL_CAPSULE | Freq: Every day | ORAL | Status: DC
Start: 2017-05-02 — End: 2017-05-03
  Administered 2017-05-02 – 2017-05-03 (×2): 0.4 mg via ORAL
  Filled 2017-05-01 (×2): qty 1

## 2017-05-01 MED ORDER — METHOCARBAMOL 500 MG PO TABS
500.0000 mg | ORAL_TABLET | Freq: Four times a day (QID) | ORAL | Status: DC | PRN
  Administered 2017-05-01 – 2017-05-02 (×2): 500 mg via ORAL
  Filled 2017-05-01: qty 1

## 2017-05-01 MED ORDER — BACITRACIN ZINC 500 UNIT/GM EX OINT
TOPICAL_OINTMENT | CUTANEOUS | Status: DC | PRN
  Administered 2017-05-01: 1 via TOPICAL

## 2017-05-01 MED ORDER — 0.9 % SODIUM CHLORIDE (POUR BTL) OPTIME
TOPICAL | Status: DC | PRN
  Administered 2017-05-01: 1000 mL

## 2017-05-01 MED ORDER — SODIUM CHLORIDE 0.9 % IV SOLN: 250.0000 mL | INTRAVENOUS | Status: DC

## 2017-05-01 MED ORDER — LIDOCAINE-EPINEPHRINE 1 %-1:100000 IJ SOLN
INTRAMUSCULAR | Status: DC | PRN
Start: 2017-05-01 — End: 2017-05-01

## 2017-05-01 MED ORDER — ONDANSETRON HCL 4 MG/2ML IJ SOLN
INTRAMUSCULAR | Status: AC
  Filled 2017-05-01: qty 2

## 2017-05-01 MED ORDER — OXYCODONE HCL 5 MG PO TABS
5.0000 mg | ORAL_TABLET | ORAL | Status: DC | PRN
  Administered 2017-05-02 (×2): 5 mg via ORAL
  Administered 2017-05-03: 10 mg via ORAL
  Filled 2017-05-01 (×2): qty 1
  Filled 2017-05-01: qty 2

## 2017-05-01 MED ORDER — HEMOSTATIC AGENTS (NO CHARGE) OPTIME
TOPICAL | Status: DC | PRN
  Administered 2017-05-01: 1 via TOPICAL

## 2017-05-01 MED ORDER — ONDANSETRON HCL 4 MG PO TABS: 4.0000 mg | ORAL_TABLET | Freq: Four times a day (QID) | ORAL | Status: DC | PRN

## 2017-05-01 MED ORDER — SODIUM CHLORIDE 0.9 % IV SOLN
INTRAVENOUS | Status: DC
  Administered 2017-05-01: 20:00:00 via INTRAVENOUS

## 2017-05-01 MED ORDER — PHENYLEPHRINE 40 MCG/ML (10ML) SYRINGE FOR IV PUSH (FOR BLOOD PRESSURE SUPPORT)
PREFILLED_SYRINGE | INTRAVENOUS | Status: DC | PRN
  Administered 2017-05-01: 80 ug via INTRAVENOUS

## 2017-05-01 MED ORDER — FENTANYL CITRATE (PF) 250 MCG/5ML IJ SOLN
INTRAMUSCULAR | Status: AC
  Filled 2017-05-01: qty 5

## 2017-05-01 MED ORDER — CEFAZOLIN SODIUM-DEXTROSE 2-4 GM/100ML-% IV SOLN
2.0000 g | INTRAVENOUS | Status: AC
  Administered 2017-05-01: 2 g via INTRAVENOUS
  Filled 2017-05-01: qty 100

## 2017-05-01 MED ORDER — SODIUM CHLORIDE 0.9 % IR SOLN
Status: DC | PRN
  Administered 2017-05-01: 500 mL

## 2017-05-01 MED ORDER — SODIUM CHLORIDE 0.9% FLUSH
3.0000 mL | Freq: Two times a day (BID) | INTRAVENOUS | Status: DC
  Administered 2017-05-01 – 2017-05-03 (×4): 3 mL via INTRAVENOUS

## 2017-05-01 MED ORDER — MEPERIDINE HCL 25 MG/ML IJ SOLN: 6.2500 mg | INTRAMUSCULAR | Status: DC | PRN

## 2017-05-01 MED ORDER — LACTATED RINGERS IV SOLN
INTRAVENOUS | Status: DC
  Administered 2017-05-01 (×2): via INTRAVENOUS

## 2017-05-01 MED ORDER — TRAZODONE HCL 50 MG PO TABS
50.0000 mg | ORAL_TABLET | Freq: Every evening | ORAL | Status: DC | PRN
  Administered 2017-05-01 – 2017-05-02 (×2): 50 mg via ORAL
  Filled 2017-05-01 (×2): qty 1

## 2017-05-01 MED ORDER — FUROSEMIDE 20 MG PO TABS: 20.0000 mg | ORAL_TABLET | Freq: Every day | ORAL | Status: DC | PRN

## 2017-05-01 MED ORDER — SUGAMMADEX SODIUM 200 MG/2ML IV SOLN
INTRAVENOUS | Status: DC | PRN
  Administered 2017-05-01: 200 mg via INTRAVENOUS

## 2017-05-01 MED ORDER — PHENYLEPHRINE HCL 10 MG/ML IJ SOLN
INTRAVENOUS | Status: DC | PRN
  Administered 2017-05-01: 25 ug/min via INTRAVENOUS
  Administered 2017-05-01: 13:00:00 via INTRAVENOUS

## 2017-05-01 MED ORDER — AMLODIPINE BESYLATE 5 MG PO TABS
5.0000 mg | ORAL_TABLET | Freq: Every day | ORAL | Status: DC
  Administered 2017-05-02 – 2017-05-03 (×2): 5 mg via ORAL
  Filled 2017-05-01 (×2): qty 1

## 2017-05-01 MED ORDER — MIDAZOLAM HCL 2 MG/2ML IJ SOLN
INTRAMUSCULAR | Status: AC
  Filled 2017-05-01: qty 2

## 2017-05-01 MED ORDER — PROMETHAZINE HCL 25 MG/ML IJ SOLN: 6.2500 mg | INTRAMUSCULAR | Status: DC | PRN

## 2017-05-01 MED ORDER — METHOCARBAMOL 500 MG PO TABS
ORAL_TABLET | ORAL | Status: AC
  Filled 2017-05-01: qty 1

## 2017-05-01 MED ORDER — THROMBIN 5000 UNITS EX SOLR
CUTANEOUS | Status: DC | PRN
  Administered 2017-05-01 (×2): 5000 [IU] via TOPICAL

## 2017-05-01 MED ORDER — ROCURONIUM BROMIDE 10 MG/ML (PF) SYRINGE
PREFILLED_SYRINGE | INTRAVENOUS | Status: DC | PRN
  Administered 2017-05-01: 40 mg via INTRAVENOUS
  Administered 2017-05-01: 30 mg via INTRAVENOUS
  Administered 2017-05-01: 60 mg via INTRAVENOUS

## 2017-05-01 MED ORDER — BACITRACIN ZINC 500 UNIT/GM EX OINT
TOPICAL_OINTMENT | CUTANEOUS | Status: AC
  Filled 2017-05-01: qty 28.35

## 2017-05-01 MED ORDER — BISACODYL 10 MG RE SUPP: 10.0000 mg | Freq: Every day | RECTAL | Status: DC | PRN

## 2017-05-01 MED ORDER — MORPHINE SULFATE (PF) 2 MG/ML IV SOLN: 2.0000 mg | INTRAVENOUS | Status: DC | PRN

## 2017-05-01 MED ORDER — ALLOPURINOL 100 MG PO TABS
300.0000 mg | ORAL_TABLET | Freq: Every day | ORAL | Status: DC
  Administered 2017-05-02 – 2017-05-03 (×2): 300 mg via ORAL
  Filled 2017-05-01 (×2): qty 3

## 2017-05-01 MED ORDER — GELATIN ABSORBABLE MT POWD: OROMUCOSAL | Status: DC | PRN

## 2017-05-01 MED ORDER — FERROUS FUMARATE 324 (106 FE) MG PO TABS
1.0000 | ORAL_TABLET | Freq: Every day | ORAL | Status: DC
  Administered 2017-05-01 – 2017-05-03 (×3): 106 mg via ORAL
  Filled 2017-05-01 (×4): qty 1

## 2017-05-01 MED ORDER — KETOROLAC TROMETHAMINE 15 MG/ML IJ SOLN
15.0000 mg | Freq: Four times a day (QID) | INTRAMUSCULAR | Status: AC
  Administered 2017-05-01 – 2017-05-02 (×3): 15 mg via INTRAVENOUS
  Filled 2017-05-01 (×2): qty 1

## 2017-05-01 MED ORDER — THROMBIN 5000 UNITS EX SOLR
CUTANEOUS | Status: AC
  Filled 2017-05-01: qty 15000

## 2017-05-01 MED ORDER — BUPIVACAINE HCL (PF) 0.5 % IJ SOLN: INTRAMUSCULAR | Status: DC | PRN

## 2017-05-01 MED ORDER — PANTOPRAZOLE SODIUM 40 MG IV SOLR
40.0000 mg | Freq: Every day | INTRAVENOUS | Status: DC
  Administered 2017-05-01: 40 mg via INTRAVENOUS
  Filled 2017-05-01: qty 40

## 2017-05-01 MED ORDER — ACETAMINOPHEN 650 MG RE SUPP: 650.0000 mg | RECTAL | Status: DC | PRN

## 2017-05-01 SURGICAL SUPPLY — 68 items
BAG DECANTER FOR FLEXI CONT (MISCELLANEOUS) ×3 IMPLANT
BENZOIN TINCTURE PRP APPL 2/3 (GAUZE/BANDAGES/DRESSINGS) IMPLANT
BLADE CLIPPER SURG (BLADE) IMPLANT
BLADE SURG 11 STRL SS (BLADE) ×3 IMPLANT
BUR MATCHSTICK NEURO 3.0 LAGG (BURR) ×3 IMPLANT
BUR PRECISION FLUTE 5.0 (BURR) ×3 IMPLANT
CANISTER SUCT 3000ML PPV (MISCELLANEOUS) ×3 IMPLANT
CARTRIDGE OIL MAESTRO DRILL (MISCELLANEOUS) ×1 IMPLANT
CLOSURE WOUND 1/2 X4 (GAUZE/BANDAGES/DRESSINGS)
CONT SPEC 4OZ CLIKSEAL STRL BL (MISCELLANEOUS) ×3 IMPLANT
DECANTER SPIKE VIAL GLASS SM (MISCELLANEOUS) ×3 IMPLANT
DERMABOND ADVANCED (GAUZE/BANDAGES/DRESSINGS) ×2
DERMABOND ADVANCED .7 DNX12 (GAUZE/BANDAGES/DRESSINGS) ×1 IMPLANT
DIFFUSER DRILL AIR PNEUMATIC (MISCELLANEOUS) ×3 IMPLANT
DRAPE LAPAROTOMY 100X72X124 (DRAPES) ×3 IMPLANT
DRAPE MICROSCOPE LEICA (MISCELLANEOUS) ×3 IMPLANT
DRAPE POUCH INSTRU U-SHP 10X18 (DRAPES) ×3 IMPLANT
DRAPE SURG 17X23 STRL (DRAPES) ×3 IMPLANT
DRSG OPSITE POSTOP 4X8 (GAUZE/BANDAGES/DRESSINGS) ×3 IMPLANT
DURAPREP 26ML APPLICATOR (WOUND CARE) ×3 IMPLANT
ELECT CAUTERY BLADE 6.4 (BLADE) ×3 IMPLANT
ELECT REM PT RETURN 9FT ADLT (ELECTROSURGICAL) ×3
ELECTRODE REM PT RTRN 9FT ADLT (ELECTROSURGICAL) ×1 IMPLANT
GAUZE SPONGE 4X4 12PLY STRL (GAUZE/BANDAGES/DRESSINGS) IMPLANT
GAUZE SPONGE 4X4 12PLY STRL LF (GAUZE/BANDAGES/DRESSINGS) ×3 IMPLANT
GAUZE SPONGE 4X4 16PLY XRAY LF (GAUZE/BANDAGES/DRESSINGS) IMPLANT
GLOVE BIO SURGEON STRL SZ7 (GLOVE) IMPLANT
GLOVE BIO SURGEON STRL SZ7.5 (GLOVE) ×3 IMPLANT
GLOVE BIOGEL PI IND STRL 6.5 (GLOVE) ×2 IMPLANT
GLOVE BIOGEL PI IND STRL 7.0 (GLOVE) IMPLANT
GLOVE BIOGEL PI IND STRL 7.5 (GLOVE) ×2 IMPLANT
GLOVE BIOGEL PI INDICATOR 6.5 (GLOVE) ×4
GLOVE BIOGEL PI INDICATOR 7.0 (GLOVE)
GLOVE BIOGEL PI INDICATOR 7.5 (GLOVE) ×4
GLOVE ECLIPSE 7.0 STRL STRAW (GLOVE) ×3 IMPLANT
GLOVE EXAM NITRILE LRG STRL (GLOVE) IMPLANT
GLOVE EXAM NITRILE XL STR (GLOVE) IMPLANT
GLOVE EXAM NITRILE XS STR PU (GLOVE) IMPLANT
GLOVE SURG SS PI 6.5 STRL IVOR (GLOVE) ×6 IMPLANT
GOWN STRL REUS W/ TWL LRG LVL3 (GOWN DISPOSABLE) ×3 IMPLANT
GOWN STRL REUS W/ TWL XL LVL3 (GOWN DISPOSABLE) IMPLANT
GOWN STRL REUS W/TWL 2XL LVL3 (GOWN DISPOSABLE) IMPLANT
GOWN STRL REUS W/TWL LRG LVL3 (GOWN DISPOSABLE) ×6
GOWN STRL REUS W/TWL XL LVL3 (GOWN DISPOSABLE)
HEMOSTAT POWDER KIT SURGIFOAM (HEMOSTASIS) ×3 IMPLANT
KIT BASIN OR (CUSTOM PROCEDURE TRAY) ×3 IMPLANT
KIT ROOM TURNOVER OR (KITS) ×3 IMPLANT
NEEDLE HYPO 18GX1.5 BLUNT FILL (NEEDLE) IMPLANT
NEEDLE HYPO 25X1 1.5 SAFETY (NEEDLE) ×3 IMPLANT
NEEDLE SPNL 18GX3.5 QUINCKE PK (NEEDLE) ×3 IMPLANT
NS IRRIG 1000ML POUR BTL (IV SOLUTION) ×3 IMPLANT
OIL CARTRIDGE MAESTRO DRILL (MISCELLANEOUS) ×3
PACK LAMINECTOMY NEURO (CUSTOM PROCEDURE TRAY) ×3 IMPLANT
PAD ARMBOARD 7.5X6 YLW CONV (MISCELLANEOUS) ×9 IMPLANT
RUBBERBAND STERILE (MISCELLANEOUS) ×6 IMPLANT
SPONGE LAP 4X18 X RAY DECT (DISPOSABLE) IMPLANT
SPONGE SURGIFOAM ABS GEL SZ50 (HEMOSTASIS) ×3 IMPLANT
STAPLER VISISTAT 35W (STAPLE) ×3 IMPLANT
STRIP CLOSURE SKIN 1/2X4 (GAUZE/BANDAGES/DRESSINGS) IMPLANT
SUT VIC AB 0 CT1 18XCR BRD8 (SUTURE) ×4 IMPLANT
SUT VIC AB 0 CT1 8-18 (SUTURE) ×8
SUT VIC AB 2-0 CT1 18 (SUTURE) IMPLANT
SUT VICRYL 3-0 RB1 18 ABS (SUTURE) ×6 IMPLANT
SYR 3ML LL SCALE MARK (SYRINGE) IMPLANT
SYR BULB 3OZ (MISCELLANEOUS) ×3 IMPLANT
TOWEL GREEN STERILE (TOWEL DISPOSABLE) ×3 IMPLANT
TOWEL GREEN STERILE FF (TOWEL DISPOSABLE) ×3 IMPLANT
WATER STERILE IRR 1000ML POUR (IV SOLUTION) ×3 IMPLANT

## 2017-05-01 NOTE — Anesthesia Postprocedure Evaluation (Signed)
Anesthesia Post Note  Patient: [name]  Procedure(s) Performed: Procedure(s) (LRB): Lumbar one-Lumbar two, Lumbar two-Lumbar three, Lumbar three-Lumbar four Laminectomy; Evacuation of hematoma (N/A)     Patient location during evaluation: PACU Anesthesia Type: General Level of consciousness: sedated and patient cooperative Pain management: pain level controlled Vital Signs Assessment: post-procedure vital signs reviewed and stable Respiratory status: spontaneous breathing Cardiovascular status: stable Anesthetic complications: no    Last Vitals:  Vitals:   05/01/17 1333 05/01/17 1336  BP:  (!) 148/68  Pulse: 84   Resp: 18   Temp:    SpO2: 100%     Last Pain:  Vitals:   05/01/17 1333  PainSc: 0-No pain                 Nolon Nations

## 2017-05-01 NOTE — Progress Notes (Signed)
Moderate oozing from back incision, reinforced with 2 ABD pads & hypofix.  Urine from foley was initiallly clear yellow upon PACU adm, and it is now fairly pink tinged. VSS, NSR 80's.  Brandy at Dr Maximiano Coss office fully updated and will let PA know about this. Will continue to monitor.

## 2017-05-01 NOTE — Progress Notes (Signed)
Lunch relief by Vivia Birmingham RN

## 2017-05-01 NOTE — Progress Notes (Signed)
Pt arrived to 5C04 via stretcher.  Pt alert and oriented, hungry.  VSS. Foley catheter noted with pink tinged urine output, dressing to back with marked drainage.  Will continue to monitor and pass on to oncoming shift.  Cori Razor, RN

## 2017-05-01 NOTE — H&P (Signed)
Chief Complaint  Leg pain, inability to walk  History of Present Illness  Mr. Field Staniszewski is a 81 year old man I am seeing for severe relatively rapidly progressive low back and right greater than left leg pain. He says symptoms started about 6 weeks ago. Prior to that, he was walking normally, completely independent with normal ADLs. There is no particular inciting event for the pain. Currently, he is describing pain which runs across his lower back, and sharp pain in his right buttock, which travels down the right leg. He indicates that the pain in his right leg is in the back of his thigh as well as the front of his thigh, in his calf and shin, and sometimes in the foot. This is associated with numbness and tingling in the right leg. He says he occasionally gets similar pains in the left, however it is not nearly as severe. He says when he is sitting stationary, there is minimal pain, however soon as he gets upper tries to walk the pain becomes very severe. Because of that, he has been unable to take a shower or use the bathroom without assistance. At this point, he is confined to a wheelchair to leave the house because of the pain. He has not noted any changes in bowel or bladder function. He was initially placed on a steroid Dosepak which did not provide any relief. He has since been started on other medications including anti-inflammatory, muscle relaxer, and gabapentin, as well as hydrocodone. This combination of medications seems to provide a modest amount of relief but only for about an hour at a time. He has also undergone an epidural steroid injection, apparently via transforaminal route on the right at L5-S1. He says that this injection did not provide any relief, and actually might have made his pain a little bit worse.   Past Medical History   Past Medical History:  Diagnosis Date  . Aortic stenosis   . Atrial fibrillation ([place])   . Baker cyst    Left knee  . DJD (degenerative  joint disease)    knees  . Dysrhythmia   . Eczema   . Erectile dysfunction   . Gout   . HTN (hypertension)   . MVP (mitral valve prolapse)   . Shortness of breath dyspnea    with activity    Past Surgical History   Past Surgical History:  Procedure Laterality Date  .  polyps vocal cord  1994  . AORTIC VALVE REPLACEMENT  ~2004  . HERNIA REPAIR  ~1978  . HERNIA REPAIR  ~1983  . LUMBAR LAMINECTOMY/DECOMPRESSION MICRODISCECTOMY N/A 01/15/2015   Procedure: 2 LEVEL DECOMPRESSIVE LUMBAR LAMINECTOMY L3-L4,L4-L5;  Surgeon: Latanya Maudlin, MD;  Location: [facility];  Service: Orthopedics;  Laterality: N/A;  . right total hip  ~4 years ago  . ROTATOR CUFF REPAIR Left ~2009  . TOTAL KNEE ARTHROPLASTY Left 04/14/2015   Procedure: LEFT TOTAL KNEE ARTHROPLASTY;  Surgeon: Latanya Maudlin, MD;  Location: [facility];  Service: Orthopedics;  Laterality: Left;    Social History   Social History  Substance Use Topics  . Smoking status: Former Smoker    Years: 30.00    Types: Cigarettes    Quit date: 08/28/2002  . Smokeless tobacco: Never Used  . Alcohol use Yes     Comment: social    Medications   Prior to Admission medications   Medication Sig Start Date End Date Taking? Authorizing Provider  allopurinol (ZYLOPRIM) 300 MG tablet Take 1 tablet (300 mg  total) by mouth daily. 09/11/16  Yes Dorena Cookey, MD  amLODipine (NORVASC) 5 MG tablet Take 1 tablet (5 mg total) by mouth daily. 09/11/16  Yes Dorena Cookey, MD  furosemide (LASIX) 20 MG tablet TAKE ONE TABLET BY MOUTH ONCE DAILY AS NEEDED FOR  SWELLING Patient taking differently: Take 20 mg by mouth daily as needed for fluid. TAKE ONE TABLET BY MOUTH ONCE DAILY AS NEEDED FOR  SWELLING 09/11/16  Yes Dorena Cookey, MD  gabapentin (NEURONTIN) 300 MG capsule Take 300 mg by mouth 2 (two) times daily.   Yes [provider]  HYDROcodone-acetaminophen (NORCO/VICODIN) 5-325 MG tablet Take 1 tablet by mouth every 6 (six) hours as needed for  moderate pain.   Yes [provider]  ibuprofen (ADVIL,MOTRIN) 200 MG tablet Take 400 mg by mouth every 4 (four) hours as needed for moderate pain.   Yes [provider]  IRON PO Take 1 tablet by mouth daily.   Yes [provider]  naproxen sodium (ALEVE) 220 MG tablet Take 440 mg by mouth daily.   Yes [provider]  predniSONE (DELTASONE) 20 MG tablet TAKE ONE-HALF TABLET BY MOUTH EVERY OTHER DAY Patient taking differently: Take 10 mg by mouth daily.  09/11/16  Yes Dorena Cookey, MD  tamsulosin [facility]) 0.4 MG CAPS capsule Take 1 capsule (0.4 mg total) by mouth daily after breakfast. 09/11/16  Yes Dorena Cookey, MD  tiZANidine (ZANAFLEX) 4 MG tablet Take 4 mg by mouth every 6 (six) hours as needed for muscle spasms.   Yes [provider]  traZODone (DESYREL) 50 MG tablet TAKE ONE TABLET BY MOUTH AT BEDTIME AS NEEDED Patient taking differently: Take 50 mg by mouth at bedtime. TAKE ONE TABLET BY MOUTH AT BEDTIME AS NEEDED 09/11/16  Yes Dorena Cookey, MD  warfarin (COUMADIN) 5 MG tablet TAKE AS DIRECTED BY THE COUMADIN CLINIC Patient taking differently: Take 2.5-5 mg by mouth daily. TAKES 5 MG (1 TAB) ON Monday AND Friday AND TAKES 2.5 MG ( HALF TAB) ON ALL OTHER DAYS 10/31/16  Yes Dorena Cookey, MD    Allergies  No Known Allergies  Review of Systems  ROS  Neurologic Exam  Awake, alert, oriented Memory and concentration grossly intact Speech fluent, appropriate CN grossly intact Motor exam: Upper Extremities Deltoid Bicep Tricep Grip  Right 5/5 5/5 5/5 5/5  Left 5/5 5/5 5/5 5/5   Lower Extremities IP Quad PF DF EHL  Right 4/5 -->      Left 4/5 -->       Giveaway weakness BLE Sensation grossly intact to LT  Imaging  MRI of the lumbar spine dated 03/08/2017 was reviewed. This demonstrates maintenance of normal lumbar lordosis. At L1-2, there is disc degeneration, broad-based disc bulge, and significant ligamentous hypertrophy and  facet arthropathy and resultant moderate to severe central stenosis. There is a similar constellation of findings at L2-3 with resultant severe stenosis. At L3-4 there is moderate central stenosis, and primarily left-sided subarticular lateral recess stenosis. At L4-5, there is right greater than left lateral recess stenosis, and at L5-S1, there is primarily right-sided lateral recess and foraminal stenosis.   Impression  81 year old man with severe back and right greater than left leg pain which has been fairly rapidly progressive. He has not responded to medical therapy or injection therapy. He is not in a position to participate with therapy given his pain at this point. Clinically, it appears as though his pain is likely  multifactorial in nature, not clearly attributable to 1 specific dermatome or myotome. I therefore think that any surgical option would have to address multiple areas.   Plan  would plan on proceeding with laminectomy at L1-2, L2, and L2-3, as well as interlaminar decompression with foraminotomy at L4-5 and L5-S1.   The radiographic findings and general treatment options were discussed in detail with the patient and his daughter in the office. We discussed continued conservative treatment versus surgical decompression. The risks of the surgery were discussed in detail, including but not limited to bleeding, infection, nerve injury resulting in leg/foot weakness/numbness or bowel/bladder dysfunction, and CSF leak. Possible outcomes of surgery were also discussed including the possibility of persistence or worsening of pain symptoms. We discussed the possible need for additional spinal surgery in the future. The general risks of anesthesia were also reviewed including heart attack, stroke, and DVT/PE.   We also discussed expected postoperative course, including the possibility of inpatient rehab or skilled nursing facility placement prior to return home.  The patient understood our  discussion and is willing to proceed with surgery. All questions were answered.

## 2017-05-01 NOTE — Anesthesia Procedure Notes (Signed)
Procedure Name: Intubation Date/Time: 05/01/2017 10:21 AM Performed by: Mervyn Gay Pre-anesthesia Checklist: Patient identified, Patient being monitored, Timeout performed, Emergency Drugs available and Suction available Patient Re-evaluated:Patient Re-evaluated prior to induction Oxygen Delivery Method: Circle System Utilized Preoxygenation: Pre-oxygenation with 100% oxygen Induction Type: IV induction Ventilation: Mask ventilation without difficulty Laryngoscope Size: Miller and 3 Grade View: Grade I Tube type: Oral Tube size: 8.0 mm Number of attempts: 1 Airway Equipment and Method: Stylet Placement Confirmation: ETT inserted through vocal cords under direct vision,  positive ETCO2 and breath sounds checked- equal and bilateral Secured at: 23 cm Tube secured with: Tape Dental Injury: Teeth and Oropharynx as per pre-operative assessment

## 2017-05-01 NOTE — Transfer of Care (Signed)
Immediate Anesthesia Transfer of Care Note  Patient: [name]  Procedure(s) Performed: Procedure(s): Lumbar one-Lumbar two, Lumbar two-Lumbar three, Lumbar three-Lumbar four Laminectomy; Evacuation of hematoma (N/A)  Patient Location: PACU  Anesthesia Type:General  Level of Consciousness: awake, alert , oriented and patient cooperative  Airway & Oxygen Therapy: Patient Spontanous Breathing and Patient connected to face mask oxygen  Post-op Assessment: Report given to RN, Post -op Vital signs reviewed and stable and Patient moving all extremities X 4  Post vital signs: Reviewed and stable  Last Vitals:  Vitals:   05/01/17 0812  BP: (!) 201/100  Pulse: 76  Resp: 20  Temp: (!) 36.3 C  SpO2: 99%    Last Pain:  Vitals:   05/01/17 0831  PainSc: 10-Worst pain ever         Complications: No apparent anesthesia complications

## 2017-05-02 ENCOUNTER — Encounter (HOSPITAL_COMMUNITY): Payer: Self-pay | Admitting: Neurosurgery

## 2017-05-02 MED ORDER — PANTOPRAZOLE SODIUM 40 MG PO TBEC
40.0000 mg | DELAYED_RELEASE_TABLET | Freq: Every day | ORAL | Status: DC
  Administered 2017-05-02: 40 mg via ORAL
  Filled 2017-05-02: qty 1

## 2017-05-02 NOTE — Progress Notes (Signed)
Pt ambulated with rolling walker to the nursing station and back to his room.  Pt tolerated well, no complaints of pain.  Dressing to his back draining at the bottom, reinforced.  Will continue to monitor.  Cori Razor, RN

## 2017-05-02 NOTE — Care Management Note (Signed)
Case Management Note  Patient Details  Name: [name] MRN: [id number] Date of Birth: 10-18-1935  Subjective/Objective:  Pt underwent: Lumbar one-Lumbar two, Lumbar two-Lumbar three, Lumbar three-Lumbar four Laminectomy; Evacuation of hematoma. He is from home with family.                  Action/Plan: PT recommending SNF. Awaiting OT recommendations. CSW aware. CM following for d/c disposition.  Expected Discharge Date:                  Expected Discharge Plan:  Skilled Nursing Facility  In-House Referral:  Clinical Social Work  Discharge planning Services     Post Acute Care Choice:    Choice offered to:     DME Arranged:    DME Agency:     HH Arranged:    [place] Agency:     Status of Service:  In process, will continue to follow  If discussed at Long Length of Stay Meetings, dates discussed:    Additional Comments:  Pollie Friar, RN 05/02/2017, 10:31 AM

## 2017-05-02 NOTE — NC FL2 (Signed)
[name] LEVEL OF CARE SCREENING TOOL     IDENTIFICATION  Patient Name: [name] Birthdate: May 08, 1936 Sex: male Admission Date (Current Location): 05/01/2017  [name] and [place] Number:  Herbalist and Address:  [facility] [place] [address], [place], [place] [address]      Provider Number: [id number]  Attending Physician Name and Address:  Consuella Lose, MD  Relative Name and Phone Number:       Current Level of Care: Hospital Recommended Level of Care: [place] Prior Approval Number:    Date Approved/Denied:   PASRR Number: [id number]  Discharge Plan: SNF    Current Diagnoses: Patient Active Problem List   Diagnosis Date Noted  . Nontraumatic epidural hematoma ([place]) 05/01/2017  . Routine general medical examination at a health care facility 08/17/2015  . Dyspnea 06/10/2015  . History of total knee arthroplasty 04/14/2015  . Spinal stenosis, lumbar region, with neurogenic claudication 01/15/2015  . Encounter for therapeutic drug monitoring 10/13/2013  . Heart valve replaced by other means 07/03/2013  . Long term (current) use of anticoagulants 07/03/2013  . Urticaria 11/27/2011  . Anxiety 04/04/2011  . Preop cardiovascular exam 12/19/2010  . CAROTID BRUIT 03/23/2009  . DEGENERATIVE JOINT DISEASE 03/22/2009  . FIBRILLATION, ATRIAL 03/16/2009  . Aortic valve disorder 01/21/2008  . 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 Gout 04/17/2007  . Essential hypertension 04/17/2007    Orientation RESPIRATION BLADDER Height & Weight     Self, Time, Situation, Place  Normal Continent Weight:   Height:     BEHAVIORAL SYMPTOMS/MOOD NEUROLOGICAL BOWEL NUTRITION STATUS      Continent    AMBULATORY STATUS COMMUNICATION OF NEEDS Skin   Limited Assist Verbally Surgical wounds                       Personal Care Assistance Level of Assistance  Bathing, Dressing Bathing Assistance: Limited  assistance   Dressing Assistance: Limited assistance     Functional Limitations Info             SPECIAL CARE FACTORS FREQUENCY  PT (By licensed PT), OT (By licensed OT)     PT Frequency: 5x/wk OT Frequency: 5x/wk            Contractures      Additional Factors Info  Code Status, Allergies Code Status Info: Full Allergies Info: NKA           Current Medications (05/02/2017):  This is the current hospital active medication list Current Facility-Administered Medications  Medication Dose Route Frequency Provider Last Rate Last Dose  . 0.9 %  sodium chloride infusion   Intravenous Continuous Consuella Lose, MD 75 mL/hr at 05/01/17 1930    . 0.9 %  sodium chloride infusion  250 mL Intravenous Continuous Consuella Lose, MD      . acetaminophen (TYLENOL) tablet 650 mg  650 mg Oral Q4H PRN Consuella Lose, MD       Or  . acetaminophen (TYLENOL) suppository 650 mg  650 mg Rectal Q4H PRN Consuella Lose, MD      . allopurinol (ZYLOPRIM) tablet 300 mg  300 mg Oral Daily Consuella Lose, MD   300 mg at 05/02/17 0915  . amLODipine (NORVASC) tablet 5 mg  5 mg Oral Daily Consuella Lose, MD   5 mg at 05/02/17 0915  . bisacodyl (DULCOLAX) suppository 10 mg  10 mg Rectal Daily PRN Consuella Lose, MD      .  docusate sodium (COLACE) capsule 100 mg  100 mg Oral BID Consuella Lose, MD   100 mg at 05/02/17 0916  . Ferrous Fumarate (HEMOCYTE - 106 mg FE) tablet 106 mg of iron  1 tablet Oral Daily Consuella Lose, MD   106 mg of iron at 05/02/17 0915  . furosemide (LASIX) tablet 20 mg  20 mg Oral Daily PRN Consuella Lose, MD      . gabapentin (NEURONTIN) capsule 300 mg  300 mg Oral BID Consuella Lose, MD   300 mg at 05/02/17 0916  . ketorolac (TORADOL) 15 MG/ML injection 15 mg  15 mg Intravenous Q6H Consuella Lose, MD   15 mg at 05/02/17 0505  . menthol-cetylpyridinium (CEPACOL) lozenge 3 mg  1 lozenge Oral PRN Consuella Lose, MD       Or  .  phenol (CHLORASEPTIC) mouth spray 1 spray  1 spray Mouth/Throat PRN Consuella Lose, MD      . methocarbamol (ROBAXIN) tablet 500 mg  500 mg Oral Q6H PRN Consuella Lose, MD   500 mg at 05/02/17 9450   Or  . methocarbamol (ROBAXIN) 500 mg in dextrose 5 % 50 mL IVPB  500 mg Intravenous Q6H PRN Consuella Lose, MD      . morphine 2 MG/ML injection 2 mg  2 mg Intravenous Q2H PRN Consuella Lose, MD      . ondansetron (ZOFRAN) tablet 4 mg  4 mg Oral Q6H PRN Consuella Lose, MD       Or  . ondansetron (ZOFRAN) injection 4 mg  4 mg Intravenous Q6H PRN Consuella Lose, MD      . oxyCODONE (Oxy IR/ROXICODONE) immediate release tablet 5-10 mg  5-10 mg Oral Q3H PRN Consuella Lose, MD   5 mg at 05/02/17 0918  . pantoprazole (PROTONIX) injection 40 mg  40 mg Intravenous QHS Consuella Lose, MD   40 mg at 05/01/17 2126  . predniSONE (DELTASONE) tablet 10 mg  10 mg Oral Daily Consuella Lose, MD   10 mg at 05/02/17 0915  . senna (SENOKOT) tablet 8.6 mg  1 tablet Oral BID Consuella Lose, MD   8.6 mg at 05/02/17 0915  . sodium chloride flush (NS) 0.9 % injection 3 mL  3 mL Intravenous Q12H Consuella Lose, MD   3 mL at 05/02/17 0916  . sodium chloride flush (NS) 0.9 % injection 3 mL  3 mL Intravenous PRN Consuella Lose, MD      . tamsulosin (FLOMAX) capsule 0.4 mg  0.4 mg Oral QPC breakfast Consuella Lose, MD   0.4 mg at 05/02/17 0915  . tiZANidine (ZANAFLEX) tablet 4 mg  4 mg Oral Q6H PRN Consuella Lose, MD      . traZODone (DESYREL) tablet 50 mg  50 mg Oral QHS PRN Consuella Lose, MD   50 mg at 05/01/17 2326     Discharge Medications: Please see discharge summary for a list of discharge medications.  Relevant Imaging Results:  Relevant Lab Results:   Additional Information SS#: [id number]  Geralynn Ochs, LCSW

## 2017-05-02 NOTE — Evaluation (Signed)
Physical Therapy Evaluation Patient Details Name: [name] MRN: [id number] DOB: 02-10-1936 Today's Date: 05/02/2017   History of Present Illness  Patient is a 81 yo male s/p Lumbar one-Lumbar two, Lumbar two-Lumbar three, Lumbar three-Lumbar four Laminectomy; Evacuation of hematoma.  Clinical Impression  Orders received for PT evaluation. Patient demonstrates deficits in functional mobility as indicated below. Will benefit from continued skilled PT to address deficits and maximize function. Will see as indicated and progress as tolerated.      Follow Up Recommendations SNF;Supervision/Assistance - 24 hour    Equipment Recommendations  Rolling walker with 5" wheels    Recommendations for Other Services       Precautions / Restrictions Precautions Precautions: Back Precaution Booklet Issued: Yes (comment) Precaution Comments: verbally reviewed Restrictions Weight Bearing Restrictions: No      Mobility  Bed Mobility Overal bed mobility: Needs Assistance Bed Mobility: Rolling;Sidelying to Sit Rolling: Min assist Sidelying to sit: Mod assist       General bed mobility comments: moderate assist to power trunk to upright, patient able to initiate   Transfers Overall transfer level: Needs assistance Equipment used: Rolling walker (2 wheeled) Transfers: Sit to/from Stand Sit to Stand: Mod assist         General transfer comment: Moderate assist to power up to standing with modified hand placement.   Ambulation/Gait Ambulation/Gait assistance: Min guard Ambulation Distance (Feet): 110 Feet Assistive device: Rolling walker (2 wheeled)   Gait velocity: decreased   General Gait Details: Min assist for stability, multi modal cues for RLE positioning. Patient walks with a toe out gait pattern due to gross weakness RLE. VCs for upright posture and positioning during ambulation  Stairs            Wheelchair Mobility    Modified Rankin (Stroke Patients Only)       Balance Overall balance assessment: Needs assistance   Sitting balance-Leahy Scale: Fair       Standing balance-Leahy Scale: Poor Standing balance comment: needs physical assist and reliance on RW                             Pertinent Vitals/Pain Pain Assessment: No/denies pain    Home Living Family/patient expects to be discharged to:: Skilled nursing facility Living Arrangements: Children Available Help at Discharge: Family;Friend(s);Available PRN/intermittently Type of Home: House Home Access: Stairs to enter Entrance Stairs-Rails: Left Entrance Stairs-Number of Steps: 3 Home Layout: Two level;Able to live on main level with bedroom/bathroom Home Equipment: [place] - 2 wheels;Cane - single point;Grab bars - toilet;Grab bars - tub/shower;Bedside commode      Prior Function Level of Independence: Independent         Comments: prior to a week ago, has been in a w/c at his daughters for the past week     Hand Dominance   Dominant Hand: Right    Extremity/Trunk Assessment   Upper Extremity Assessment Upper Extremity Assessment: Defer to OT evaluation    Lower Extremity Assessment Lower Extremity Assessment: Generalized weakness;RLE deficits/detail RLE Deficits / Details: modest assymetrical weakness gross motions, functional weakness effecting hip rotation during gait RLE Coordination: decreased gross motor    Cervical / Trunk Assessment Cervical / Trunk Assessment:  (s/p spinal surgery)  Communication   Communication: No difficulties  Cognition Arousal/Alertness: Awake/alert Behavior During Therapy: WFL for tasks assessed/performed Overall Cognitive Status: Within Functional Limits for tasks assessed  General Comments      Exercises     Assessment/Plan    PT Assessment Patient needs continued PT services  PT Problem List Decreased strength;Decreased range of motion;Decreased  activity tolerance;Decreased balance;Decreased mobility;Decreased safety awareness;Pain       PT Treatment Interventions DME instruction;Gait training;Functional mobility training;Therapeutic activities;Balance training;Therapeutic exercise;Stair training;Cognitive remediation;Patient/family education    PT Goals (Current goals can be found in the Care Plan section)  Acute Rehab PT Goals Patient Stated Goal: to be independent  PT Goal Formulation: With patient Time For Goal Achievement: 05/16/17 Potential to Achieve Goals: Good    Frequency Min 5X/week   Barriers to discharge        Co-evaluation               AM-PAC PT "6 Clicks" Daily Activity  Outcome Measure Difficulty turning over in bed (including adjusting bedclothes, sheets and blankets)?: Unable Difficulty moving from lying on back to sitting on the side of the bed? : Unable Difficulty sitting down on and standing up from a chair with arms (e.g., wheelchair, bedside commode, etc,.)?: Unable Help needed moving to and from a bed to chair (including a wheelchair)?: A Little Help needed walking in hospital room?: A Little Help needed climbing 3-5 steps with a railing? : A Lot 6 Click Score: 11    End of Session Equipment Utilized During Treatment: Gait belt Activity Tolerance: Patient tolerated treatment well Patient left: in chair;with call bell/phone within reach Nurse Communication: Mobility status PT Visit Diagnosis: Unsteadiness on feet (R26.81);Muscle weakness (generalized) (M62.81)    Time: 9528-4132 PT Time Calculation (min) (ACUTE ONLY): 26 min   Charges:   PT Evaluation $PT Eval Moderate Complexity: 1 Mod PT Treatments $Gait Training: 8-22 mins   PT G Codes:        Alben Deeds, PT DPT  Board Certified Neurologic Specialist [place] 05/02/2017, 10:22 AM

## 2017-05-02 NOTE — Progress Notes (Signed)
No issues overnight. Pt reports no back or leg pain, feeling well.  EXAM:  BP 131/62 (BP Location: Right Arm)   Pulse 77   Temp 98.2 F (36.8 C) (Oral)   Resp 18   SpO2 99%   Awake, alert, oriented  Speech fluent, appropriate  CN grossly intact  5/5 BUE/BLE   IMPRESSION:  81 y.o. male POD#1 s/p lumbar laminectomy for spontaneous spinal epidural hematoma, significant improvement in pain.  PLAN: - Mobilize with PT/OT today

## 2017-05-02 NOTE — Therapy (Signed)
Occupational Therapy Evaluation Patient Details Name: [name] MRN: [id number] DOB: [birth year] Today's Date: 05/02/2017    History of Present Illness Patient is a 81 yo male s/p Lumbar one-Lumbar two, Lumbar two-Lumbar three, Lumbar three-Lumbar four Laminectomy; Evacuation of hematoma. PMH: aortic stenosis, artrial fibrillation, baker cyst, DJD, and HTN.    Clinical Impression   Pt reports moving to his daughter's house, using a wheel chair for mobility and requiring moderate assistance for all ADLs due to severe pain for about a week PTA. Currently, pt requires min assist for ADLs  at RW level and supervision for UB ADLs. Pt requires moderate assistance to boost up for sit/stand transfers. Pt would benefit from rehab at SNF to maximize function and return to prior level of function. OT will follow acutely to address established goals and promote safe d/c to next venue of care.     Follow Up Recommendations  SNF;Supervision/Assistance - 24 hour    Equipment Recommendations   (Pt has all needed DME and AE )    Recommendations for Other Services       Precautions / Restrictions Precautions Precautions: Back Precaution Booklet Issued: Yes (comment) Precaution Comments: Pt able to recall 3/3 back precautions. Restrictions Weight Bearing Restrictions: No      Mobility Bed Mobility       General bed mobility comments: Pt sitting in chair upon OT arrival  Transfers Overall transfer level: Needs assistance Equipment used: Rolling walker (2 wheeled) Transfers: Sit to/from Stand Sit to Stand: Mod assist         General transfer comment: Moderate assist to boost up to standing from chair. Cues for proper hand placement.    Balance Overall balance assessment: Needs assistance Sitting-balance support: No upper extremity supported;Feet supported Sitting balance-Leahy Scale: Fair Sitting balance - Comments: Pt able to sit at edge of chair and attempt to bring foot over knee  with no LOB.   Standing balance support: Bilateral upper extremity supported;During functional activity Standing balance-Leahy Scale: Poor Standing balance comment: Heavy UE reliance on RW. Min assist for balance and safety                           ADL either performed or assessed with clinical judgement   ADL Overall ADL's : Needs assistance/impaired     Grooming: Minimal assistance;Cueing for safety;Standing Grooming Details (indicate cue type and reason): Educated on compensatory technique for oral care with back precautions. Upper Body Bathing: Supervision/ safety;Set up;Cueing for safety;Sitting   Lower Body Bathing: Minimal assistance;Cueing for safety;Cueing for back precautions;Sit to/from stand;With adaptive equipment Lower Body Bathing Details (indicate cue type and reason): Pt unable to bring foot over knee. Pt has long handled sponge. Upper Body Dressing : Supervision/safety;Set up;Cueing for safety;Sitting   Lower Body Dressing: Minimal assistance;With adaptive equipment;Cueing for safety;Cueing for back precautions;Sit to/from stand Lower Body Dressing Details (indicate cue type and reason): Pt unable to bring foot over knee to don/doff LB clothing. Pt has a reacher, sock aide and shoe horn.  Toilet Transfer: Minimal assistance;Cueing for safety;Ambulation;Comfort height toilet;RW   Toileting- Clothing Manipulation and Hygiene: Minimal assistance;Cueing for back precautions;Sitting/lateral lean Toileting - Clothing Manipulation Details (indicate cue type and reason): Educated on compensatory techniques for toilet hygiene. Tub/ Shower Transfer: Minimal assistance;Adhering to back precautions;Cueing for safety;Ambulation;Shower seat;Grab bars;Rolling walker   Functional mobility during ADLs: Minimal assistance;Rolling walker General ADL Comments: Pt educated on compensatory techniques and AE to complete ADLs  Vision         Perception     Praxis       Pertinent Vitals/Pain Pain Assessment: No/denies pain     Hand Dominance Right   Extremity/Trunk Assessment Upper Extremity Assessment Upper Extremity Assessment: Overall WFL for tasks assessed   Lower Extremity Assessment Lower Extremity Assessment: Defer to PT evaluation    Cervical / Trunk Assessment Cervical / Trunk Assessment: Other exceptions Cervical / Trunk Exceptions: s/p lumbar surgery   Communication Communication Communication: No difficulties   Cognition Arousal/Alertness: Awake/alert Behavior During Therapy: WFL for tasks assessed/performed Overall Cognitive Status: Within Functional Limits for tasks assessed                                     General Comments  Pt able to recall 3/3 back precautions and is cautious of safety. Pt concerned about weakness from limited mobility over the past week. Heavy reliance on RW during functional mobility due to decreased balance and generalized weakness.     Exercises     Shoulder Instructions      Home Living Family/patient expects to be discharged to:: Skilled nursing facility Living Arrangements: Children (daughter's house where he lived for the past week PTA)  Available Help at Discharge: Family;Friend(s);Available PRN/intermittently Type of Home: House Home Access: Stairs to enter [employer] of Steps: 3 Entrance Stairs-Rails: Left Home Layout: Two level;Able to live on main level with bedroom/bathroom Alternate Level Stairs-Number of Steps:  (does not go upstairs)   Bathroom Shower/Tub: Occupational psychologist: Standard Bathroom Accessibility: Yes How Accessible: Accessible via walker Home Equipment: [place] - 2 wheels;Cane - single point;Grab bars - toilet;Grab bars - tub/shower;Bedside commode;Adaptive equipment Adaptive Equipment: Reacher;Sock aid;Long-handled sponge;Long-handled shoe horn        Prior Functioning/Environment Level of Independence: Independent         Comments: Pt has been living with his daughter for the past week. Pt has been using a w/c and required assistance with all ADLs due to pain.         OT Problem List: Decreased strength;Decreased range of motion;Impaired balance (sitting and/or standing);Decreased knowledge of use of DME or AE      OT Treatment/Interventions: Self-care/ADL training;Energy conservation;DME and/or AE instruction;Therapeutic activities;Patient/family education;Balance training    OT Goals(Current goals can be found in the care plan section) Acute Rehab OT Goals Patient Stated Goal: to be independent  OT Goal Formulation: With patient Time For Goal Achievement: 05/16/17 Potential to Achieve Goals: Good  OT Frequency: Min 2X/week   Barriers to D/C:            Co-evaluation              AM-PAC PT "6 Clicks" Daily Activity     Outcome Measure Help from another person eating meals?: None Help from another person taking care of personal grooming?: A Little Help from another person toileting, which includes using toliet, bedpan, or urinal?: A Little Help from another person bathing (including washing, rinsing, drying)?: A Little Help from another person to put on and taking off regular upper body clothing?: None Help from another person to put on and taking off regular lower body clothing?: A Little 6 Click Score: 20   End of Session Equipment Utilized During Treatment: Gait belt;Rolling walker Nurse Communication: Mobility status  Activity Tolerance: Patient tolerated treatment well Patient left: in chair;with call bell/phone within reach  OT Visit Diagnosis: Unsteadiness on feet (R26.81);Muscle weakness (generalized) (M62.81)                Time: 1540-0867 OT Time Calculation (min): 23 min Charges:    G-Codes:     Boykin Peek, OTS [phone]   Boykin Peek 05/02/2017, 10:47 AM

## 2017-05-02 NOTE — Clinical Social Work Note (Signed)
Clinical Social Work Assessment  Patient Details  Name: [name] MRN: [id number] Date of Birth: 03/05/1936  Date of referral:  05/02/17               Reason for consult:  Facility Placement, Discharge Planning                Permission sought to share information with:  Facility Sport and exercise psychologist, Family Supports Permission granted to share information::  Yes, Verbal Permission Granted  Name::     Midwife::  Miquel Dunn  Relationship::  Daughter  Contact Information:     Housing/Transportation Living arrangements for the past 2 months:  Single Family Home Source of Information:  Patient Patient Interpreter Needed:  None Criminal Activity/Legal Involvement Pertinent to Current Situation/Hospitalization:  No - Comment as needed Significant Relationships:  Adult Children, Significant Other Lives with:  Self, Adult Children Do you feel safe going back to the place where you live?  Yes Need for family participation in patient care:  No (Coment)  Care giving concerns:  Patient has been living at home with his daughter, but is currently requiring short term rehab to improve ability to function independently prior to returning home.   Social Worker assessment / plan:  CSW met with patient to discuss discharge planning, and confirmed that patient would like to admit to [facility] because he girlfriend was there. Patient discussed that he had been over there visiting the girlfriend, and it's a nice facility. CSW contacted facility and confirmed bed availability for the patient. CSW notified patient that he would have a bed at [facility] when ready to discharge. CSW to continue to follow to facilitate discharge when medically ready.  Employment status:  Retired Nurse, adult PT Recommendations:  [place] / Referral to community resources:  [place]  Patient/Family's Response to care:  Patient agreeable to SNF  placement.  Patient/Family's Understanding of and Emotional Response to Diagnosis, Current Treatment, and Prognosis:  Patient discussed how he was happy to be able to go where his girlfriend is located, and how she's been struggling on her own after breaking both of her hips. Patient indicated understanding of CSW role in discharge planning and appreciated assistance.  Emotional Assessment Appearance:  Appears stated age Attitude/Demeanor/Rapport:    Affect (typically observed):  Appropriate Orientation:  Oriented to Self, Oriented to Place, Oriented to  Time, Oriented to Situation Alcohol / Substance use:  Not Applicable Psych involvement (Current and /or in the community):  No (Comment)  Discharge Needs  Concerns to be addressed:  Care Coordination Readmission within the last 30 days:  No Current discharge risk:  Physical Impairment Barriers to Discharge:  Continued Medical Work up   [employer], [place] 05/02/2017, 3:02 PM

## 2017-05-02 NOTE — Progress Notes (Signed)
OT Note - Addendum    05/02/17 1100  OT Visit Information  Last OT Received On 05/02/17  OT General Charges  $OT Visit 1 Visit  OT Evaluation  $OT Eval Moderate Complexity 1 Mod  OT Treatments  $Self Care/Home Management  8-22 mins  [facility], OT/L  [phone] 05/02/2017

## 2017-05-03 DIAGNOSIS — M6281 Muscle weakness (generalized): Secondary | ICD-10-CM | POA: Diagnosis not present

## 2017-05-03 DIAGNOSIS — G8918 Other acute postprocedural pain: Secondary | ICD-10-CM | POA: Diagnosis not present

## 2017-05-03 DIAGNOSIS — Z7409 Other reduced mobility: Secondary | ICD-10-CM | POA: Diagnosis not present

## 2017-05-03 DIAGNOSIS — R5381 Other malaise: Secondary | ICD-10-CM | POA: Diagnosis not present

## 2017-05-03 DIAGNOSIS — I10 Essential (primary) hypertension: Secondary | ICD-10-CM | POA: Diagnosis not present

## 2017-05-03 DIAGNOSIS — Z9889 Other specified postprocedural states: Secondary | ICD-10-CM | POA: Diagnosis not present

## 2017-05-03 DIAGNOSIS — Z4789 Encounter for other orthopedic aftercare: Secondary | ICD-10-CM | POA: Diagnosis not present

## 2017-05-03 DIAGNOSIS — M5416 Radiculopathy, lumbar region: Secondary | ICD-10-CM | POA: Diagnosis not present

## 2017-05-03 DIAGNOSIS — R2689 Other abnormalities of gait and mobility: Secondary | ICD-10-CM | POA: Diagnosis not present

## 2017-05-03 MED ORDER — WARFARIN SODIUM 5 MG PO TABS: 2.5000 mg | ORAL_TABLET | Freq: Every day | ORAL | Status: DC

## 2017-05-03 MED ORDER — METHOCARBAMOL 500 MG PO TABS: 500.0000 mg | ORAL_TABLET | Freq: Four times a day (QID) | ORAL | 0 refills | Status: DC | PRN

## 2017-05-03 MED ORDER — OXYCODONE HCL 5 MG PO TABS: 5.0000 mg | ORAL_TABLET | Freq: Four times a day (QID) | ORAL | 0 refills | Status: AC | PRN

## 2017-05-03 MED ORDER — OXYCODONE HCL 5 MG PO TABS: 5.0000 mg | ORAL_TABLET | Freq: Four times a day (QID) | ORAL | 0 refills | Status: DC | PRN

## 2017-05-03 NOTE — Progress Notes (Signed)
Discharge to: [facility] Anticipated discharge date: 05/03/17 Family notified: Yes, by phone Transportation by: Family Car  Report #: [phone]  [place] signing off.  Laveda Abbe LCSW [phone]

## 2017-05-03 NOTE — Progress Notes (Addendum)
Nurse made two attempts to call receiving nurse at [facility] place. Mikki Santee Biomedical engineer) answered the call and redirected RN by giving RN the Nurse manager's phone number Erma Heritage, [phone]. Nurse will attempt to call back and give report    Nurse called back @ 2.14p but was unable to to reach Erma Heritage, Voice message left

## 2017-05-03 NOTE — Care Management Note (Signed)
Case Management Note  Patient Details  Name: [name] MRN: [id number] Date of Birth: Mar 22, 1936  Subjective/Objective:                    Action/Plan: Pt discharging to [facility] SNF today. No further needs per CM.   Expected Discharge Date:  05/03/17               Expected Discharge Plan:  Skilled Nursing Facility  In-House Referral:  Clinical Social Work  Discharge planning Services     Post Acute Care Choice:    Choice offered to:     DME Arranged:    DME Agency:     HH Arranged:    [place] Agency:     Status of Service:  Completed, signed off  If discussed at [employer], dates discussed:    Additional Comments:  Pollie Friar, RN 05/03/2017, 10:20 AM

## 2017-05-03 NOTE — Discharge Summary (Signed)
Physician Discharge Summary  Patient ID: [name] MRN: [id number] DOB/AGE: 1936/01/09 81 y.o.  Admit date: 05/01/2017 Discharge date: 05/03/2017  Admission Diagnoses:  Lumbar radiculopathy  Discharge Diagnoses:  Same Active Problems:   Nontraumatic epidural hematoma [facility])   Discharged Condition: Stable  Hospital Course:  [name] is a 81 y.o. male admitted after elective laminectomy. He was found to have a spinal epidural hematoma which was evacuated. He had significant improvement in pain postop. He was seen by PT/OT and SNF was recommended. He was discharged in stable condition.  Treatments: Surgery - L1-L4 laminectomy, evacuation of hematoma  Discharge Exam: Blood pressure (!) 116/56, pulse 71, temperature 98 F (36.7 C), temperature source Oral, resp. rate 20, SpO2 97 %. Awake, alert, oriented Speech fluent, appropriate CN grossly intact 5/5 BUE/BLE Wound c/d/i  Disposition: 01-Home or Self Care  Discharge Instructions    Call MD for:  redness, tenderness, or signs of infection (pain, swelling, redness, odor or green/yellow discharge around incision site)    Complete by:  As directed    Call MD for:  temperature >100.4    Complete by:  As directed    Change dressing (specify)    Complete by:  As directed    Dressing change: Dry gauze dressing with tape BID until drainage stops   Diet - low sodium heart healthy    Complete by:  As directed    Discharge instructions    Complete by:  As directed    Walk at home as much as possible, at least 4 times / day   Increase activity slowly    Complete by:  As directed    Lifting restrictions    Complete by:  As directed    No lifting > 10 lbs   May shower / Bathe    Complete by:  As directed    48 hours after surgery   May walk up steps    Complete by:  As directed    Other Restrictions    Complete by:  As directed    No bending/twisting at waist     Allergies as of 05/03/2017   No Known Allergies      Medication List    STOP taking these medications   HYDROcodone-acetaminophen 5-325 MG tablet Commonly known as:  NORCO/VICODIN   ibuprofen 200 MG tablet Commonly known as:  ADVIL,MOTRIN     TAKE these medications   ALEVE 220 MG tablet Generic drug:  naproxen sodium Take 440 mg by mouth daily.   allopurinol 300 MG tablet Commonly known as:  ZYLOPRIM Take 1 tablet (300 mg total) by mouth daily.   amLODipine 5 MG tablet Commonly known as:  NORVASC Take 1 tablet (5 mg total) by mouth daily.   furosemide 20 MG tablet Commonly known as:  LASIX TAKE ONE TABLET BY MOUTH ONCE DAILY AS NEEDED FOR  SWELLING What changed:  how much to take  how to take this  when to take this  reasons to take this  additional instructions   gabapentin 300 MG capsule Commonly known as:  NEURONTIN Take 300 mg by mouth 2 (two) times daily.   IRON PO Take 1 tablet by mouth daily.   methocarbamol 500 MG tablet Commonly known as:  ROBAXIN Take 1 tablet (500 mg total) by mouth every 6 (six) hours as needed for muscle spasms.   oxyCODONE 5 MG immediate release tablet Commonly known as:  Oxy IR/ROXICODONE Take 1-2 tablets (5-10 mg total) by  mouth every 6 (six) hours as needed for breakthrough pain.   predniSONE 20 MG tablet Commonly known as:  DELTASONE TAKE ONE-HALF TABLET BY MOUTH EVERY OTHER DAY What changed:  how much to take  how to take this  when to take this  additional instructions   tamsulosin 0.4 MG Caps capsule Commonly known as:  FLOMAX Take 1 capsule (0.4 mg total) by mouth daily after breakfast.   tiZANidine 4 MG tablet Commonly known as:  ZANAFLEX Take 4 mg by mouth every 6 (six) hours as needed for muscle spasms.   traZODone 50 MG tablet Commonly known as:  DESYREL TAKE ONE TABLET BY MOUTH AT BEDTIME AS NEEDED What changed:  how much to take  how to take this  when to take this  additional instructions   warfarin 5 MG tablet Commonly known as:   COUMADIN Take 0.5-1 tablets (2.5-5 mg total) by mouth daily. TAKES 5 MG (1 TAB) ON Monday AND Friday AND TAKES 2.5 MG ( HALF TAB) ON ALL OTHER DAYS Start taking on:  05/08/2017 What changed:  how much to take  how to take this  when to take this  additional instructions  These instructions start on 05/08/2017. If you are unsure what to do until then, ask your doctor or other care provider.            Discharge Care Instructions        Start     Ordered   05/08/17 0000  warfarin (COUMADIN) 5 MG tablet  Daily    Comments:  90 day   05/03/17 1018   05/03/17 0000  methocarbamol (ROBAXIN) 500 MG tablet  Every 6 hours PRN     05/03/17 1018   05/03/17 0000  oxyCODONE (OXY IR/ROXICODONE) 5 MG immediate release tablet  Every 6 hours PRN     05/03/17 1018   05/03/17 0000  Discharge instructions    Comments:  Walk at home as much as possible, at least 4 times / day   05/03/17 1018   05/03/17 0000  Increase activity slowly     05/03/17 1018   05/03/17 0000  May walk up steps     05/03/17 1018   05/03/17 0000  May shower / Bathe    Comments:  48 hours after surgery   05/03/17 1018   05/03/17 0000  Lifting restrictions    Comments:  No lifting > 10 lbs   05/03/17 1018   05/03/17 0000  Other Restrictions    Comments:  No bending/twisting at waist   05/03/17 1018   05/03/17 0000  Diet - low sodium heart healthy     05/03/17 1018   05/03/17 0000  Call MD for:  temperature >100.4     05/03/17 1018   05/03/17 0000  Call MD for:  redness, tenderness, or signs of infection (pain, swelling, redness, odor or green/yellow discharge around incision site)     05/03/17 1018   05/03/17 0000  Change dressing (specify)    Comments:  Dressing change: Dry gauze dressing with tape BID until drainage stops   05/03/17 1018      Contact information for follow-up providers    Consuella Lose, MD. Schedule an appointment as soon as possible for a visit in 2 week(s).   Specialty:   Neurosurgery Contact information: [address] [place] [address] [phone]            Contact information for after-discharge care  Destination    [facility] Follow up.   Specialty:  [place] information: [address] [place] [phone]                  Signed: Consuella Lose, Loletha Grayer 05/03/2017, 10:18 AM

## 2017-05-03 NOTE — Progress Notes (Signed)
No issues overnight. Ambulating with PT, no leg pain, only c/o incisional pain.  EXAM:  BP (!) 116/56 (BP Location: Left Arm)   Pulse 71   Temp 98 F (36.7 C) (Oral)   Resp 20   SpO2 97%   Awake, alert, oriented  Speech fluent, appropriate  CN grossly intact  5/5 BUE/BLE  Wound clean, some bloody drainage  IMPRESSION:  81 y.o. male POD#2 s/p laminectomy, evacuation of hematoma. Resolution of preop pain, ambulating much better than preop.  PLAN: - Transfer to SNF today

## 2017-05-03 NOTE — Clinical Social Work Placement (Signed)
   CLINICAL SOCIAL WORK PLACEMENT  NOTE  Date:  05/03/2017  Patient Details  Name: [name] MRN: [id number] Date of Birth: [birth year]  Clinical Social Work is seeking post-discharge placement for this patient at the [place] level of care (*CSW will initial, date and re-position this form in  chart as items are completed):  Yes   Patient/family provided with [place] Work Department's list of facilities offering this level of care within the geographic area requested by the patient (or if unable, by the patient's family).  Yes   Patient/family informed of their freedom to choose among providers that offer the needed level of care, that participate in Medicare, Medicaid or managed care program needed by the patient, have an available bed and are willing to accept the patient.  Yes   Patient/family informed of [place]'s ownership interest in [facility] and [facility], as well as of the fact that they are under no obligation to receive care at these facilities.  PASRR submitted to EDS on       PASRR number received on       Existing PASRR number confirmed on       FL2 transmitted to all facilities in geographic area requested by pt/family on       FL2 transmitted to all facilities within larger geographic area on 05/02/17     Patient informed that his/her managed care company has contracts with or will negotiate with certain facilities, including the following:        Yes   Patient/family informed of bed offers received.  Patient [facility] bed at [facility]     Physician recommends and patient chooses bed at      Patient to be transferred to [facility] on 05/03/17.  Patient to be transferred to facility by Family car     Patient family notified on 05/03/17 of transfer.  Name of family member notified:  [name]     PHYSICIAN       Additional Comment:    _______________________________________________ Geralynn Ochs, [place] 05/03/2017, 4:06 PM

## 2017-05-03 NOTE — Progress Notes (Signed)
Pt d/c home, no new concerns. d/c instructions given with teach back. Pt verbalize understanding. Pt taken from hospital by daughter

## 2017-05-07 ENCOUNTER — Telehealth: Payer: Self-pay | Admitting: Cardiology

## 2017-05-07 MED ORDER — WARFARIN SODIUM 5 MG PO TABS: 2.5000 mg | ORAL_TABLET | Freq: Every day | ORAL | Status: DC

## 2017-05-07 NOTE — Telephone Encounter (Signed)
[name] wants to know if Dr Stanford Breed have spoken to Dr Kathyrn Sheriff about pt's Coumadin?

## 2017-05-07 NOTE — Telephone Encounter (Signed)
Spoke with pt, aware to hold warfarin until he sees dr Stanford Breed 06-04-17. Sept coumadin appt cx.

## 2017-05-07 NOTE — Telephone Encounter (Signed)
Dr Stanford Breed spoke with dr Kathyrn Sheriff, the patient has bleed into his back and they are placing his warfarin on hold for al least 4 weeks. We will make an appointment for the patient to come in and discuss with dr Stanford Breed in 4-6 weeks.

## 2017-05-08 DIAGNOSIS — M5416 Radiculopathy, lumbar region: Secondary | ICD-10-CM | POA: Diagnosis not present

## 2017-05-08 DIAGNOSIS — Z7409 Other reduced mobility: Secondary | ICD-10-CM | POA: Diagnosis not present

## 2017-05-08 DIAGNOSIS — G8918 Other acute postprocedural pain: Secondary | ICD-10-CM | POA: Diagnosis not present

## 2017-05-09 ENCOUNTER — Ambulatory Visit: Payer: Medicare Other

## 2017-05-09 DIAGNOSIS — R2689 Other abnormalities of gait and mobility: Secondary | ICD-10-CM | POA: Diagnosis not present

## 2017-05-10 DIAGNOSIS — R5381 Other malaise: Secondary | ICD-10-CM | POA: Diagnosis not present

## 2017-05-10 DIAGNOSIS — M5416 Radiculopathy, lumbar region: Secondary | ICD-10-CM | POA: Diagnosis not present

## 2017-05-10 DIAGNOSIS — G8918 Other acute postprocedural pain: Secondary | ICD-10-CM | POA: Diagnosis not present

## 2017-05-10 DIAGNOSIS — Z7409 Other reduced mobility: Secondary | ICD-10-CM | POA: Diagnosis not present

## 2017-05-11 ENCOUNTER — Telehealth: Payer: Self-pay | Admitting: Cardiology

## 2017-05-11 DIAGNOSIS — R2689 Other abnormalities of gait and mobility: Secondary | ICD-10-CM | POA: Diagnosis not present

## 2017-05-11 NOTE — Telephone Encounter (Signed)
Routed telephone message from 05/07/2017 states patient to hold coumadin to [place] :[name]

## 2017-05-11 NOTE — Telephone Encounter (Signed)
New message   Nira Conn from [facility] and rehab calling because they were unaware that the patient was supposed to hold coumadin until 10/8. She needs these orders faxed over to their office as soon as possible.

## 2017-05-14 NOTE — Op Note (Signed)
PREOP DIAGNOSIS: Lumbar spinal stenosis, L1-S1  POSTOP DIAGNOSIS: Epidural hematoma, L1-L4  PROCEDURE: 1. L1-L4 laminectomy, evacuation of epidural hematoma  SURGEON: Dr. Consuella Lose, MD  ASSISTANT: Ferne Reus, PA-C  ANESTHESIA: General Endotracheal  EBL: 250cc  SPECIMENS: Epidural hematoma  DRAINS: None  COMPLICATIONS: None immediate  CONDITION: Stable to PCAU  HISTORY: [name] is a 81 y.o. male who initially presented to the outpatient clinic with back and leg pain With weakness of relatively rapid onset. He underwent MRI scan demonstrating multilevel lumbar stenosis worst at L1-L5. Treatment options were discussed and he elected to proceed with surgical decompression.  The risks and benefits of the surgery were explained in detail to the patient and his daughter.  After all questions were answered informed consent was obtained.   PROCEDURE IN DETAIL: After informed consent was obtained and witnessed, the patient was brought to the operating room. After induction of general anesthesia, the patient was positioned on the operative table in the prone position with all pressure points meticulously padded. The skin of the low back was then prepped and draped in the usual sterile fashion.  Under fluoroscopy, the correct levels were identified and marked out on the skin, and after timeout was conducted, the skin was infiltrated with local anesthetic. Skin incision was then made sharply and Bovie electrocautery was used to dissect the subcutaneous tissue until the lumbodorsal fascia was identified. The fascia was then incised using Bovie electrocautery and the lamina at the L1 through L3 levels was identified and dissection was carried out in the subperiosteal plane. Scar tissue overlying the previous laminotomy was dissected until the facet complex was identified at L3 and L4. Self-retaining retractor was then placed, and intraoperative x-ray was taken to confirm we were  at the correct levels.    Using high-speed drill, laminectomy was initially performed at the inferior portion of L1 and the L1-2 interspace.  Upon removal of the ligamentum flavum and the lamina, I encountered epidural hematoma especially on the right side of the thecal sac.  Laminectomy was extended somewhat superiorly through L1, and carried inferiorly to approximately the L4 level where the inferior portion of the hematoma was identified.  The hematoma was significantly compressing the thecal sac especially on the right side.  The hematoma was dissected away from the dura using blunt dissectors, and removed and sent for permanent pathology.  After removal of the hematoma, I elected to not complete the laminectomy at S1 as I do believe that the patient has acute onset of symptoms is likely related to spontaneous development of the hematoma, and it is this hematoma that is responsible for his symptoms.  I therefore did not believe that continuing for decompression of his degenerative spondylosis at L5 and S1 was going to benefit him.  Hemostasis was then secured using a combination of morcellized Gelfoam and thrombin and bipolar electrocautery. The wound is irrigated with copious amounts of antibiotic saline irrigation.Self-retaining retractor was then removed, and the wound is closed in layers using a combination of interrupted 0 Vicryl and 3-0 Vicryl stitches. The skin was closed using standard skin glue.  At the end of the case all sponge, needle, and instrument counts were correct. The patient was then transferred to the stretcher and taken to the postanesthesia care unit in stable hemodynamic condition.

## 2017-05-15 DIAGNOSIS — G8918 Other acute postprocedural pain: Secondary | ICD-10-CM | POA: Diagnosis not present

## 2017-05-15 DIAGNOSIS — R2689 Other abnormalities of gait and mobility: Secondary | ICD-10-CM | POA: Diagnosis not present

## 2017-05-15 DIAGNOSIS — M5416 Radiculopathy, lumbar region: Secondary | ICD-10-CM | POA: Diagnosis not present

## 2017-05-16 ENCOUNTER — Ambulatory Visit: Payer: Medicare Other

## 2017-05-21 DIAGNOSIS — Z952 Presence of prosthetic heart valve: Secondary | ICD-10-CM | POA: Diagnosis not present

## 2017-05-21 DIAGNOSIS — I10 Essential (primary) hypertension: Secondary | ICD-10-CM | POA: Diagnosis not present

## 2017-05-21 DIAGNOSIS — M1711 Unilateral primary osteoarthritis, right knee: Secondary | ICD-10-CM | POA: Diagnosis not present

## 2017-05-21 DIAGNOSIS — M48061 Spinal stenosis, lumbar region without neurogenic claudication: Secondary | ICD-10-CM | POA: Diagnosis not present

## 2017-05-21 DIAGNOSIS — Z4789 Encounter for other orthopedic aftercare: Secondary | ICD-10-CM | POA: Diagnosis not present

## 2017-05-21 DIAGNOSIS — M5416 Radiculopathy, lumbar region: Secondary | ICD-10-CM | POA: Diagnosis not present

## 2017-05-21 DIAGNOSIS — Z7901 Long term (current) use of anticoagulants: Secondary | ICD-10-CM | POA: Diagnosis not present

## 2017-05-21 DIAGNOSIS — Z96641 Presence of right artificial hip joint: Secondary | ICD-10-CM | POA: Diagnosis not present

## 2017-05-21 DIAGNOSIS — G319 Degenerative disease of nervous system, unspecified: Secondary | ICD-10-CM | POA: Diagnosis not present

## 2017-05-21 DIAGNOSIS — I4891 Unspecified atrial fibrillation: Secondary | ICD-10-CM | POA: Diagnosis not present

## 2017-05-21 DIAGNOSIS — Z96652 Presence of left artificial knee joint: Secondary | ICD-10-CM | POA: Diagnosis not present

## 2017-05-22 NOTE — Progress Notes (Signed)
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 Preoperative cardiac catheterization revealed normal coronary arteries. He has permanent atrial fibrillation. Previous carotid Dopplers due to bruits revealed 0-39% bilateral stenosis. CTA in April of 2014 showed atheromatous thoracic aorta without aneurysm, dissection or stenosis. Holter monitor October 2016 showed atrial fibrillation rate controlled. Echo 8/18 showed normal LV function; prosthetic aortic valve with mean gradient 18 mmHg, moderate biatrial enlargement, mild RVE, mild TR and moderate pulmonary hypertension. Recently admitted with back pain for planned laminectomy. However he was found to have spontaneous epidural hematoma which was evacuated. Discussed with Dr Kathyrn Sheriff and he felt anticoagulation could be carefully resumed. Pt presents to discuss this today. Since I last saw his back pain is much better. He has mild dyspnea on exertion but no orthopnea, PND, pedal edema, chest pain, palpitations or syncope.  Current Outpatient Prescriptions  Medication Sig Dispense Refill  . allopurinol (ZYLOPRIM) 300 MG tablet Take 1 tablet (300 mg total) by mouth daily. 100 tablet 4  . amLODipine (NORVASC) 5 MG tablet Take 1 tablet (5 mg total) by mouth daily. 90 tablet 4  . furosemide (LASIX) 20 MG tablet TAKE ONE TABLET BY MOUTH ONCE DAILY AS NEEDED FOR  SWELLING (Patient taking differently: Take 20 mg by mouth daily as needed for fluid. TAKE ONE TABLET BY MOUTH ONCE DAILY AS NEEDED FOR  SWELLING) 100 tablet 4  . IRON PO Take 1 tablet by mouth daily.    . naproxen sodium (ALEVE) 220 MG tablet Take 440 mg by mouth daily.    . predniSONE (DELTASONE) 20 MG tablet TAKE ONE-HALF TABLET BY MOUTH EVERY OTHER DAY (Patient taking differently: Take 10 mg by mouth daily. ) 60 tablet 1  . tamsulosin (FLOMAX) 0.4 MG CAPS capsule Take 1 capsule (0.4 mg total) by  mouth daily after breakfast. 100 capsule 5  . traZODone (DESYREL) 50 MG tablet TAKE ONE TABLET BY MOUTH AT BEDTIME AS NEEDED (Patient taking differently: Take 50 mg by mouth at bedtime. TAKE ONE TABLET BY MOUTH AT BEDTIME AS NEEDED) 90 tablet 4  . warfarin (COUMADIN) 5 MG tablet Take 0.5-1 tablets (2.5-5 mg total) by mouth daily. TAKES 5 MG (1 TAB) ON Monday AND Friday AND TAKES 2.5 MG ( HALF TAB) ON ALL OTHER DAYS= ON HOLD UNTIL 06-04-17     No current facility-administered medications for this visit.      Past Medical History:  Diagnosis Date  . Aortic stenosis   . Atrial fibrillation ([place])   . Baker cyst    Left knee  . DJD (degenerative joint disease)    knees  . Dysrhythmia   . Eczema   . Erectile dysfunction   . Gout   . HTN (hypertension)   . MVP (mitral valve prolapse)   . Shortness of breath dyspnea    with activity    Past Surgical History:  Procedure Laterality Date  .  polyps vocal cord  1994  . AORTIC VALVE REPLACEMENT  ~2004  . HERNIA REPAIR  ~1978  . HERNIA REPAIR  ~1983  . LUMBAR LAMINECTOMY/DECOMPRESSION MICRODISCECTOMY N/A 01/15/2015   Procedure: 2 LEVEL DECOMPRESSIVE LUMBAR LAMINECTOMY L3-L4,L4-L5;  Surgeon: Latanya Maudlin, MD;  Location: [facility];  Service: Orthopedics;  Laterality: N/A;  . LUMBAR LAMINECTOMY/DECOMPRESSION MICRODISCECTOMY N/A 05/01/2017   Procedure: Lumbar one-Lumbar two, Lumbar two-Lumbar three, Lumbar three-Lumbar four Laminectomy; Evacuation of hematoma;  Surgeon: Consuella Lose, MD;  Location: [place] [facility];  Service: Neurosurgery;  Laterality: N/A;  . right total hip  ~4 years ago  . ROTATOR CUFF REPAIR Left ~2009  . TOTAL KNEE ARTHROPLASTY Left 04/14/2015   Procedure: LEFT TOTAL KNEE ARTHROPLASTY;  Surgeon: Latanya Maudlin, MD;  Location: [facility];  Service: Orthopedics;  Laterality: Left;    Social History   Social History  . Marital status: Divorced    Spouse name: N/A  . Number of children: N/A  . Years of education: N/A   Occupational  History  . Not on file.   Social History Main Topics  . Smoking status: Former Smoker    Years: 30.00    Types: Cigarettes    Quit date: 08/28/2002  . Smokeless tobacco: Never Used  . Alcohol use Yes     Comment: social  . Drug use: No  . Sexual activity: Not on file   Other Topics Concern  . Not on file   Social History Narrative  . No narrative on file    Family History  Problem Relation Age of Onset  . Diabetes Unknown   . Stroke Unknown     ROS: no fevers or chills, productive cough, hemoptysis, dysphasia, odynophagia, melena, hematochezia, dysuria, hematuria, rash, seizure activity, orthopnea, PND, pedal edema, claudication. Remaining systems are negative.  Physical Exam: Well-developed well-nourished in no acute distress.  Skin is warm and dry.  HEENT is normal.  Neck is supple.  Back status post surgical intervention Chest is clear to auscultation with normal expansion.  Cardiovascular exam is regular rate and rhythm. 2/6 systolic murmur left sternal border. Abdominal exam nontender or distended. No masses palpated. Extremities show no edema. neuro grossly intact   A/P  1 Permanent atrial fibrillation-patient's heart rate is controlled on no medications. Issue of anticoagulation is difficult. CHADSvasc 3. However he had a recent epidural hematoma requiring evacuation. I previously discussed with Dr Kathyrn Sheriff. There would be risk with anticoagulation but he felt we could reattempt and follow closely. I discussed the risks and benefits of anticoagulation at length today. I explained the risk of embolic event including CVA off of anticoagulation. I explained the risk of bleeding including recurrent epidural hematoma while on anticoagulation. We have elected to try anticoagulation again. Discontinue naproxen for 1 week. Avoid nonsteroidals. Begin apixaban 5 mg twice a day in one week. Check hemoglobin and renal function in 4 weeks. I will have him seen by one of our  physician assistants in 8 weeks to make sure that he is stable. He will contact us with any bleeding issues or back pain.  2 hypertension-blood pressure is controlled. Continue present medications.  3 Status post aortic valve replacement-continue SBE prophylaxis.   Kirk Ruths, MD

## 2017-05-24 DIAGNOSIS — Z4789 Encounter for other orthopedic aftercare: Secondary | ICD-10-CM | POA: Diagnosis not present

## 2017-05-24 DIAGNOSIS — Z7901 Long term (current) use of anticoagulants: Secondary | ICD-10-CM | POA: Diagnosis not present

## 2017-05-24 DIAGNOSIS — M1711 Unilateral primary osteoarthritis, right knee: Secondary | ICD-10-CM | POA: Diagnosis not present

## 2017-05-24 DIAGNOSIS — I4891 Unspecified atrial fibrillation: Secondary | ICD-10-CM | POA: Diagnosis not present

## 2017-05-24 DIAGNOSIS — M5416 Radiculopathy, lumbar region: Secondary | ICD-10-CM | POA: Diagnosis not present

## 2017-05-24 DIAGNOSIS — Z952 Presence of prosthetic heart valve: Secondary | ICD-10-CM | POA: Diagnosis not present

## 2017-05-24 DIAGNOSIS — Z96641 Presence of right artificial hip joint: Secondary | ICD-10-CM | POA: Diagnosis not present

## 2017-05-24 DIAGNOSIS — Z96652 Presence of left artificial knee joint: Secondary | ICD-10-CM | POA: Diagnosis not present

## 2017-05-24 DIAGNOSIS — G319 Degenerative disease of nervous system, unspecified: Secondary | ICD-10-CM | POA: Diagnosis not present

## 2017-05-24 DIAGNOSIS — M48061 Spinal stenosis, lumbar region without neurogenic claudication: Secondary | ICD-10-CM | POA: Diagnosis not present

## 2017-05-24 DIAGNOSIS — I10 Essential (primary) hypertension: Secondary | ICD-10-CM | POA: Diagnosis not present

## 2017-05-30 DIAGNOSIS — M5416 Radiculopathy, lumbar region: Secondary | ICD-10-CM | POA: Diagnosis not present

## 2017-05-30 DIAGNOSIS — Z952 Presence of prosthetic heart valve: Secondary | ICD-10-CM | POA: Diagnosis not present

## 2017-05-30 DIAGNOSIS — M1711 Unilateral primary osteoarthritis, right knee: Secondary | ICD-10-CM | POA: Diagnosis not present

## 2017-05-30 DIAGNOSIS — M48061 Spinal stenosis, lumbar region without neurogenic claudication: Secondary | ICD-10-CM | POA: Diagnosis not present

## 2017-05-30 DIAGNOSIS — Z4789 Encounter for other orthopedic aftercare: Secondary | ICD-10-CM | POA: Diagnosis not present

## 2017-05-30 DIAGNOSIS — G319 Degenerative disease of nervous system, unspecified: Secondary | ICD-10-CM | POA: Diagnosis not present

## 2017-05-30 DIAGNOSIS — Z96652 Presence of left artificial knee joint: Secondary | ICD-10-CM | POA: Diagnosis not present

## 2017-05-30 DIAGNOSIS — I4891 Unspecified atrial fibrillation: Secondary | ICD-10-CM | POA: Diagnosis not present

## 2017-05-30 DIAGNOSIS — Z7901 Long term (current) use of anticoagulants: Secondary | ICD-10-CM | POA: Diagnosis not present

## 2017-05-30 DIAGNOSIS — Z96641 Presence of right artificial hip joint: Secondary | ICD-10-CM | POA: Diagnosis not present

## 2017-05-30 DIAGNOSIS — I10 Essential (primary) hypertension: Secondary | ICD-10-CM | POA: Diagnosis not present

## 2017-06-04 ENCOUNTER — Ambulatory Visit (INDEPENDENT_AMBULATORY_CARE_PROVIDER_SITE_OTHER): Payer: Medicare Other | Admitting: Cardiology

## 2017-06-04 ENCOUNTER — Encounter: Payer: Self-pay | Admitting: Cardiology

## 2017-06-04 ENCOUNTER — Ambulatory Visit: Payer: Self-pay | Admitting: General Practice

## 2017-06-04 VITALS — BP 122/66 | HR 88 | Ht 68.0 in | Wt 175.0 lb

## 2017-06-04 DIAGNOSIS — I4891 Unspecified atrial fibrillation: Secondary | ICD-10-CM

## 2017-06-04 DIAGNOSIS — Z5181 Encounter for therapeutic drug level monitoring: Secondary | ICD-10-CM | POA: Diagnosis not present

## 2017-06-04 DIAGNOSIS — I10 Essential (primary) hypertension: Secondary | ICD-10-CM | POA: Diagnosis not present

## 2017-06-04 DIAGNOSIS — Z952 Presence of prosthetic heart valve: Secondary | ICD-10-CM | POA: Diagnosis not present

## 2017-06-04 DIAGNOSIS — I482 Chronic atrial fibrillation: Secondary | ICD-10-CM | POA: Diagnosis not present

## 2017-06-04 DIAGNOSIS — I359 Nonrheumatic aortic valve disorder, unspecified: Secondary | ICD-10-CM

## 2017-06-04 DIAGNOSIS — I4821 Permanent atrial fibrillation: Secondary | ICD-10-CM

## 2017-06-04 MED ORDER — APIXABAN 5 MG PO TABS: 5.0000 mg | ORAL_TABLET | Freq: Two times a day (BID) | ORAL | 5 refills | Status: DC

## 2017-06-04 NOTE — Patient Instructions (Addendum)
Medication Instructions:  STOP ALEVE/NAPROXEN   START ELIQUIS 5 MG TWICE A DAY IN 1 WEEK  Labwork: CBC/BMET 4 WEEKS  AFTER STARTING THE ELQUIS   Testing/Procedures: NONE  Follow-Up: Your physician recommends that you schedule a follow-up appointment in: [place] wants you to follow-up in: [place] will receive a reminder letter in the mail two months in advance. If you don't receive a letter, please call our office to schedule the follow-up appointment.  If you need a refill on your cardiac medications before your next appointment, please call your pharmacy.

## 2017-06-05 ENCOUNTER — Telehealth: Payer: Self-pay | Admitting: Cardiology

## 2017-06-05 NOTE — Telephone Encounter (Signed)
Spoke with patient.  He cannot afford the $184 per month.  Was previously on warfarin but switched after spontaneous epidural hematoma.  Hesitant to go back on warfarin because of this.  Dr. Jacalyn Lefevre office note indicates that he should start Eliquis on Oct 15.    Dr. Stanford Breed out of office until next week.  Will have patient pick up card for free 30 days supply and start next Monday.  Will route to Dr. Stanford Breed for thoughts when he gets back.

## 2017-06-05 NOTE — Telephone Encounter (Signed)
Would xarelto be cheaper? If not resume coumadin with goal inr 2-2.5 [name]

## 2017-06-05 NOTE — Telephone Encounter (Signed)
New message    Patient states Eliquis to expensive, asked to pay $184 at pharmacy. Patient request cheaper medication be prescribed.   Pt c/o medication issue:  1. Name of Medication: apixaban (ELIQUIS) 5 MG TABS tablet  2. How are you currently taking this medication (dosage and times per day)? Take 1 tablet (5 mg total) by mouth 2 (two) times daily.  3. Are you having a reaction (difficulty breathing--STAT)? No   4. What is your medication issue? Patient states medication is too costly, wants to try cheaper med

## 2017-06-07 MED ORDER — RIVAROXABAN 20 MG PO TABS
20.0000 mg | ORAL_TABLET | Freq: Every day | ORAL | 3 refills | Status: DC
Start: 2017-06-07 — End: 2017-06-07

## 2017-06-07 NOTE — Telephone Encounter (Signed)
Unable to leave message, will try later.  Have sent rx to [facility] for Xarelto to see if less expensive

## 2017-06-07 NOTE — Telephone Encounter (Signed)
Patient given 30 day free card for Eliquis and has contacted the manufacturer regarding assistance.  He knows to call the office if he has not heard from them before running out of medication.

## 2017-06-18 ENCOUNTER — Other Ambulatory Visit: Payer: Self-pay | Admitting: *Deleted

## 2017-06-18 MED ORDER — APIXABAN 5 MG PO TABS: 5.0000 mg | ORAL_TABLET | Freq: Two times a day (BID) | ORAL | 3 refills | Status: DC

## 2017-06-21 ENCOUNTER — Telehealth: Payer: Self-pay | Admitting: Cardiology

## 2017-06-21 NOTE — Telephone Encounter (Signed)
Please call,question about his Eliquis.

## 2017-06-21 NOTE — Telephone Encounter (Signed)
Spoke with pt, he was denied patient assistance with eliquis. He was given xarelto as an alternative. He is going to contact his pharmacy about cost and let me know.

## 2017-06-22 ENCOUNTER — Other Ambulatory Visit: Payer: Self-pay | Admitting: Family Medicine

## 2017-07-09 ENCOUNTER — Telehealth: Payer: Self-pay

## 2017-07-09 DIAGNOSIS — Z5181 Encounter for therapeutic drug level monitoring: Secondary | ICD-10-CM | POA: Diagnosis not present

## 2017-07-09 NOTE — Telephone Encounter (Signed)
Ok to change to coumadin; refer back to coumadin clinic Kirk Ruths

## 2017-07-09 NOTE — Telephone Encounter (Signed)
Pt walked into clinic and states that he is low and Eliquis samples and refill cost is too expensive @ $185. Pt does not qualify for discount. Pt is asking if he can take coumadin again. Call on cell phone

## 2017-07-10 LAB — CBC WITH DIFFERENTIAL/PLATELET
Basophils Absolute: 0 10*3/uL (ref 0.0–0.2)
Basos: 0 %
EOS (ABSOLUTE): 0 10*3/uL (ref 0.0–0.4)
Eos: 0 %
Hematocrit: 39.6 % (ref 37.5–51.0)
Hemoglobin: 12.9 g/dL — ABNORMAL LOW (ref 13.0–17.7)
Immature Grans (Abs): 0 10*3/uL (ref 0.0–0.1)
Immature Granulocytes: 0 %
Lymphocytes Absolute: 1.1 10*3/uL (ref 0.7–3.1)
Lymphs: 12 %
MCH: 31 pg (ref 26.6–33.0)
MCHC: 32.6 g/dL (ref 31.5–35.7)
MCV: 95 fL (ref 79–97)
Monocytes Absolute: 0.3 10*3/uL (ref 0.1–0.9)
Monocytes: 3 %
Neutrophils Absolute: 7.3 10*3/uL — ABNORMAL HIGH (ref 1.4–7.0)
Neutrophils: 85 %
Platelets: 194 10*3/uL (ref 150–379)
RBC: 4.16 x10E6/uL (ref 4.14–5.80)
RDW: 14.3 % (ref 12.3–15.4)
WBC: 8.7 10*3/uL (ref 3.4–10.8)

## 2017-07-10 LAB — BASIC METABOLIC PANEL
BUN/Creatinine Ratio: 15 (ref 10–24)
BUN: 14 mg/dL (ref 8–27)
CO2: 29 mmol/L (ref 20–29)
Calcium: 9.3 mg/dL (ref 8.6–10.2)
Chloride: 97 mmol/L (ref 96–106)
Creatinine, Ser: 0.93 mg/dL (ref 0.76–1.27)
GFR calc Af Amer: 90 mL/min/{1.73_m2} (ref >59)
GFR calc non Af Amer: 78 mL/min/{1.73_m2} (ref >59)
Glucose: 119 mg/dL — ABNORMAL HIGH (ref 65–99)
Potassium: 3.6 mmol/L (ref 3.5–5.2)
Sodium: 143 mmol/L (ref 134–144)

## 2017-07-10 NOTE — Telephone Encounter (Signed)
New message     Pt c/o medication issue:  1. Name of Medication:  Eliquis   2. How are you currently taking this medication (dosage and times per day)? 2x a day   3. Are you having a reaction (difficulty breathing--STAT)? no  4. What is your medication issue?  Medication is to expensive to fill

## 2017-07-11 ENCOUNTER — Telehealth: Payer: Self-pay | Admitting: General Practice

## 2017-07-11 NOTE — Telephone Encounter (Signed)
Spoke with pt, Aware of dr Jacalyn Lefevre recommendations. Will forward to CVRR clinic.to contact the patient with appt.

## 2017-07-11 NOTE — Telephone Encounter (Signed)
Talked to [name] today. Recent f/u was at [place] ([name]) and he will prefer to go back to that service.  I will send message to [name] for eliquis to warfarin transition and f/u visit ASAP.  Patient still had 5 days of Eliquis available

## 2017-07-11 NOTE — Telephone Encounter (Signed)
Attempted to return call to patient.  Line is busy.  Will call back later today.

## 2017-07-11 NOTE — Telephone Encounter (Signed)
Patient  Called to get instructions to transition from Eliquis to coumadin.  I instructed patient to overlap Eliquis and coumadin for 3 days.  I instructed patient to take 5 mg of coumadin 11/14, 11/15, 11/16 and 11/17, 2.5 mg on 11/18, 5 mg on 11/19 and 2.5 mg on 11/20.  Check INR on 11/21.  Patient verbalized understanding.

## 2017-07-18 ENCOUNTER — Ambulatory Visit (INDEPENDENT_AMBULATORY_CARE_PROVIDER_SITE_OTHER): Payer: Medicare Other | Admitting: General Practice

## 2017-07-18 DIAGNOSIS — Z7901 Long term (current) use of anticoagulants: Secondary | ICD-10-CM | POA: Diagnosis not present

## 2017-07-18 DIAGNOSIS — I4891 Unspecified atrial fibrillation: Secondary | ICD-10-CM

## 2017-07-18 LAB — POCT INR: INR: 1.9

## 2017-07-18 NOTE — Patient Instructions (Signed)
Pre visit review using our clinic review tool, if applicable. No additional management support is needed unless otherwise documented below in the visit note.  Take extra 1/2 tablet today (11/21) and then continue to take 1/2 tablet daily except 1 tablet on Mondays and Fridays.  Re-check in 4 weeks.

## 2017-08-01 ENCOUNTER — Other Ambulatory Visit: Payer: Self-pay | Admitting: Family Medicine

## 2017-08-02 ENCOUNTER — Ambulatory Visit: Payer: Medicare Other | Admitting: Physician Assistant

## 2017-08-02 NOTE — Telephone Encounter (Signed)
Okay #30.  Patient needs follow-up with Dr. Sherren Mocha prior to any further medication refills

## 2017-08-02 NOTE — Telephone Encounter (Signed)
Pt is on prednisone 20 mg evert other day, pt requests for a refill. Please Advise

## 2017-08-03 ENCOUNTER — Telehealth: Payer: Self-pay

## 2017-08-03 NOTE — Telephone Encounter (Signed)
Pt has a CPE  Appointment schedule for 09/01/2017, pt Rx for prednisone has been sent to pt pharmacy.

## 2017-08-03 NOTE — Telephone Encounter (Signed)
Called pt left a message for pt to return my call in the office regarding to scheduling a CPE in January.

## 2017-08-15 ENCOUNTER — Ambulatory Visit (INDEPENDENT_AMBULATORY_CARE_PROVIDER_SITE_OTHER): Payer: Medicare Other | Admitting: General Practice

## 2017-08-15 DIAGNOSIS — Z7901 Long term (current) use of anticoagulants: Secondary | ICD-10-CM | POA: Diagnosis not present

## 2017-08-15 LAB — POCT INR: INR: 1.7

## 2017-08-15 NOTE — Patient Instructions (Addendum)
Pre visit review using our clinic review tool, if applicable. No additional management support is needed unless otherwise documented below in the visit note.  Take extra 1/2 tablet today (11/21) and then continue to take 1/2 tablet daily except 1 tablet on Mondays/Wednesdays and Fridays.  Re-check in 4 weeks.

## 2017-09-10 ENCOUNTER — Other Ambulatory Visit: Payer: Self-pay | Admitting: Family Medicine

## 2017-09-10 NOTE — Telephone Encounter (Signed)
Please Advise, pt is requesting refills.

## 2017-09-11 ENCOUNTER — Ambulatory Visit (INDEPENDENT_AMBULATORY_CARE_PROVIDER_SITE_OTHER): Payer: Medicare Other | Admitting: Family Medicine

## 2017-09-11 ENCOUNTER — Encounter: Payer: Self-pay | Admitting: Family Medicine

## 2017-09-11 VITALS — BP 130/70 | HR 78 | Temp 97.7°F | Ht 68.0 in | Wt 179.0 lb

## 2017-09-11 DIAGNOSIS — F419 Anxiety disorder, unspecified: Secondary | ICD-10-CM

## 2017-09-11 DIAGNOSIS — Z96652 Presence of left artificial knee joint: Secondary | ICD-10-CM | POA: Diagnosis not present

## 2017-09-11 DIAGNOSIS — Z0000 Encounter for general adult medical examination without abnormal findings: Secondary | ICD-10-CM

## 2017-09-11 DIAGNOSIS — M1990 Unspecified osteoarthritis, unspecified site: Secondary | ICD-10-CM

## 2017-09-11 DIAGNOSIS — M48062 Spinal stenosis, lumbar region with neurogenic claudication: Secondary | ICD-10-CM

## 2017-09-11 DIAGNOSIS — M1000 Idiopathic gout, unspecified site: Secondary | ICD-10-CM

## 2017-09-11 DIAGNOSIS — I481 Persistent atrial fibrillation: Secondary | ICD-10-CM | POA: Diagnosis not present

## 2017-09-11 DIAGNOSIS — I10 Essential (primary) hypertension: Secondary | ICD-10-CM

## 2017-09-11 DIAGNOSIS — I4819 Other persistent atrial fibrillation: Secondary | ICD-10-CM

## 2017-09-11 LAB — BASIC METABOLIC PANEL
BUN: 15 mg/dL (ref 6–23)
CO2: 34 mEq/L — ABNORMAL HIGH (ref 19–32)
Calcium: 9.2 mg/dL (ref 8.4–10.5)
Chloride: 98 mEq/L (ref 96–112)
Creatinine, Ser: 0.82 mg/dL (ref 0.40–1.50)
GFR: 96.14 mL/min (ref >60.00)
Glucose, Bld: 101 mg/dL — ABNORMAL HIGH (ref 70–99)
Potassium: 3.5 mEq/L (ref 3.5–5.1)
Sodium: 140 mEq/L (ref 135–145)

## 2017-09-11 LAB — HEPATIC FUNCTION PANEL
ALT: 11 U/L (ref 0–53)
AST: 16 U/L (ref 0–37)
Albumin: 4.3 g/dL (ref 3.5–5.2)
Alkaline Phosphatase: 72 U/L (ref 39–117)
Bilirubin, Direct: 0.2 mg/dL (ref 0.0–0.3)
Total Bilirubin: 1 mg/dL (ref 0.2–1.2)
Total Protein: 7.1 g/dL (ref 6.0–8.3)

## 2017-09-11 LAB — CBC WITH DIFFERENTIAL/PLATELET
Basophils Absolute: 0 10*3/uL (ref 0.0–0.1)
Basophils Relative: 0.6 % (ref 0.0–3.0)
Eosinophils Absolute: 0 10*3/uL (ref 0.0–0.7)
Eosinophils Relative: 0.4 % (ref 0.0–5.0)
HCT: 44 % (ref 39.0–52.0)
Hemoglobin: 14.3 g/dL (ref 13.0–17.0)
Lymphocytes Relative: 8.9 % — ABNORMAL LOW (ref 12.0–46.0)
Lymphs Abs: 0.6 10*3/uL — ABNORMAL LOW (ref 0.7–4.0)
MCHC: 32.4 g/dL (ref 30.0–36.0)
MCV: 96.6 fl (ref 78.0–100.0)
Monocytes Absolute: 0.4 10*3/uL (ref 0.1–1.0)
Monocytes Relative: 5.3 % (ref 3.0–12.0)
Neutro Abs: 6.1 10*3/uL (ref 1.4–7.7)
Neutrophils Relative %: 84.8 % — ABNORMAL HIGH (ref 43.0–77.0)
Platelets: 222 10*3/uL (ref 150.0–400.0)
RBC: 4.55 Mil/uL (ref 4.22–5.81)
RDW: 15.1 % (ref 11.5–15.5)
WBC: 7.2 10*3/uL (ref 4.0–10.5)

## 2017-09-11 LAB — POCT URINALYSIS DIPSTICK
Bilirubin, UA: NEGATIVE
Blood, UA: NEGATIVE
Glucose, UA: NEGATIVE
Ketones, UA: NEGATIVE
Leukocytes, UA: NEGATIVE
Nitrite, UA: NEGATIVE
Odor: NEGATIVE
Protein, UA: NEGATIVE
Spec Grav, UA: 1.015 (ref 1.010–1.025)
Urobilinogen, UA: 0.2 E.U./dL
pH, UA: 7 (ref 5.0–8.0)

## 2017-09-11 LAB — TSH: TSH: 0.3 u[IU]/mL — ABNORMAL LOW (ref 0.35–4.50)

## 2017-09-11 MED ORDER — TRAMADOL HCL 50 MG PO TABS: ORAL_TABLET | ORAL | 4 refills | Status: DC

## 2017-09-11 MED ORDER — WARFARIN SODIUM 5 MG PO TABS: 5.0000 mg | ORAL_TABLET | Freq: Every day | ORAL | 4 refills | Status: DC

## 2017-09-11 NOTE — Telephone Encounter (Signed)
Pt is at the office this morning stated that his Eliquis was discontinued and is now on warfarin 5 mg. Pt has an appointment with you tommorow.

## 2017-09-11 NOTE — Progress Notes (Signed)
[name] is a 82 year old widowed male nonsmoker who comes in today for annual physical examination because of a history of gout, hypertension, atrial fibrillation, BPH with nocturia, chronic low back pain status post lumbar disc surgery 2, right shoulder pain, atrophy of the muscles of his left hand, and numerous other issues  He takes allopurinol 300 mg daily to prevent gout  He has a history of hypertension on Norvasc 5 mg daily along with Lasix 20 mg daily. BP 130/70. He's had a history of a valve replacement 12 years ago. He's done well and had no problems he does have A. fib. Is on Coumadin and asymptomatic  He takes Flomax 0.4 daily at bedtime because of BPH and a lead obstruction  He takes trazodone 50 mg a day at bedtime for sleep and mild depression  He gets routine eye care, dental care, colonoscopy 2005 normal therefore not repeated.  He sees Dr. Stanford Breed for cardiac evaluation. EKGs done there are apparently and changed.  He's salt the orthopedists first first back surgery neurosurgery for second. He's doing well except he has some right lumbar disc pain. He points the right SI notch as a source of his pain. He says he lies down an hour to know go away.  His osteoarthritis is getting worse he has severe deviation of his hands pain in his knees and especially now his right shoulder. He's had surgery on his left shoulder but now cannot use that shoulder at all. Now his right shoulders getting stiff. I recommend a consult with Dr. Lennette Bihari supple.  14 point review of systems June otherwise negative  Social history,,,,,,,,,, wife died many years ago from pancreatic cancer. He is still employed in the [employer]. Does works at home. He walks 30 minutes daily.  Cognitive function normal he walks daily home health safety reviewed no issues identified, no guns in the house, he does have a healthcare power of attorney and living well.  BP 130/70 (BP Location: Left Arm, Patient Position:  Sitting, Cuff Size: Normal)   Pulse 78   Temp 97.7 F (36.5 C) (Oral)   Ht 5\' 8"  (1.727 m)   Wt 179 lb (81.2 kg)   BMI 27.22 kg/m  Well-developed well-nourished male no acute distress examination HEENT was significant he has dense bilateral cataracts. He says he can see well to drive no distance difficulty no difficulty driving at night. Neck was supple thyroid is not enlarged no carotid bruits. Cardiac exam shows the PMI to be nonpalpable. He's and chronic atrial fib with a heart rate of 70-80. Scar midline chest from previous aortic valve replacement 12 years ago. Abdominal exam negative genitalia rectum done by urologist therefore not repeated. Extremities normal skin normal peripheral pulses normal except from a deviation of both thumbs and severe muscle wasting left thumb. He also has scarring on his left shoulder from previous surgery in the inability to abduct that arm more than 20. Also right shoulder now has severe limitations of range of motion.  #1 history of gout...Marland KitchenMarland KitchenMarland Kitchen continue allopurinol  #2 atrial fib,,,,,,, continue current medications #3 status post aortic valve replacement 12 years ago,,,,,,,,,,  #4 hypertension.....Marland Kitchen continue her fasting Lasix  #5 BPH......... continue followed by urologist and continue the Flomax  #6 history of mild depression.....Marland Kitchen continue trazodone  #7 severe right shoulder pain......... consult with Dr. Lennette Bihari supple.  #8 chronic low back pain/severe progressive osteoarthritis......... cannot take NSAIDs because he's on Coumadin. We'll treat him symptomatically with Tylenol 2 tabs twice a day  and low-dose tramadol 25 mg twice a day when necessary.Marland KitchenMarland Kitchen

## 2017-09-11 NOTE — Patient Instructions (Signed)
Labs today....... I will call if his anything abnormal. Please call your insurance company to find out where you can get the new shingles vaccine  Call [facility] orthopedics and make an appointment to see Dr. Lennette Bihari supple for evaluation of your right shoulder  Follow-up in one year sooner if any problems

## 2017-09-12 ENCOUNTER — Ambulatory Visit (INDEPENDENT_AMBULATORY_CARE_PROVIDER_SITE_OTHER): Payer: Medicare Other | Admitting: General Practice

## 2017-09-12 DIAGNOSIS — Z7901 Long term (current) use of anticoagulants: Secondary | ICD-10-CM

## 2017-09-12 LAB — POCT INR: INR: 2.3

## 2017-09-12 NOTE — Patient Instructions (Addendum)
Pre visit review using our clinic review tool, if applicable. No additional management support is needed unless otherwise documented below in the visit note.  Continue to take 1/2 tablet daily except 1 tablet on Mondays/Wednesdays and Fridays.  Re-check in 4 weeks.

## 2017-09-17 ENCOUNTER — Other Ambulatory Visit: Payer: Self-pay

## 2017-09-17 ENCOUNTER — Telehealth: Payer: Self-pay | Admitting: Family Medicine

## 2017-09-17 DIAGNOSIS — R7989 Other specified abnormal findings of blood chemistry: Secondary | ICD-10-CM

## 2017-09-17 DIAGNOSIS — Z125 Encounter for screening for malignant neoplasm of prostate: Secondary | ICD-10-CM | POA: Diagnosis not present

## 2017-09-17 NOTE — Telephone Encounter (Signed)
Copied from [place] [id number]. Topic: Quick Communication - See Telephone Encounter >> Sep 17, 2017  8:16 AM Boyd Kerbs wrote: CRM for notification. See Telephone encounter for:   Patient calling back, possible lab results, no CRM   09/17/17.

## 2017-09-17 NOTE — Telephone Encounter (Signed)
Spoke with pt voiced understanding that his labs are ok except for TSH level which was abnormal. Pt is scheduled to have repeat of TSH, Free T3 and T4. Orders have been placed in pt chart

## 2017-09-18 ENCOUNTER — Telehealth: Payer: Self-pay

## 2017-09-18 ENCOUNTER — Other Ambulatory Visit (INDEPENDENT_AMBULATORY_CARE_PROVIDER_SITE_OTHER): Payer: Medicare Other

## 2017-09-18 DIAGNOSIS — R7989 Other specified abnormal findings of blood chemistry: Secondary | ICD-10-CM | POA: Diagnosis not present

## 2017-09-18 LAB — T4, FREE: Free T4: 0.9 ng/dL (ref 0.60–1.60)

## 2017-09-18 LAB — T3, FREE: T3, Free: 3.1 pg/mL (ref 2.3–4.2)

## 2017-09-18 LAB — TSH: TSH: 0.55 u[IU]/mL (ref 0.35–4.50)

## 2017-09-18 NOTE — Telephone Encounter (Signed)
Pt requested for a copy of his lab results.

## 2017-09-21 ENCOUNTER — Other Ambulatory Visit: Payer: Self-pay

## 2017-09-21 DIAGNOSIS — R7989 Other specified abnormal findings of blood chemistry: Secondary | ICD-10-CM

## 2017-10-12 ENCOUNTER — Ambulatory Visit (INDEPENDENT_AMBULATORY_CARE_PROVIDER_SITE_OTHER): Payer: Medicare Other | Admitting: General Practice

## 2017-10-12 DIAGNOSIS — Z7901 Long term (current) use of anticoagulants: Secondary | ICD-10-CM

## 2017-10-12 DIAGNOSIS — I4891 Unspecified atrial fibrillation: Secondary | ICD-10-CM | POA: Diagnosis not present

## 2017-10-12 LAB — POCT INR: INR: 2.5

## 2017-10-12 NOTE — Patient Instructions (Addendum)
Pre visit review using our clinic review tool, if applicable. No additional management support is needed unless otherwise documented below in the visit note.  Continue to take 1/2 tablet daily except 1 tablet on Mondays/Wednesdays and Fridays.  Re-check in 6 weeks per patient request.

## 2017-10-29 ENCOUNTER — Other Ambulatory Visit: Payer: Self-pay | Admitting: Family Medicine

## 2017-11-13 NOTE — Progress Notes (Signed)
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 Preoperative cardiac catheterization revealed normal coronary arteries. He has permanent atrial fibrillation. Previous carotid Dopplers due to bruits revealed 0-39% bilateral stenosis. CTA in April of 2014 showed atheromatous thoracic aorta without aneurysm, dissection or stenosis. Holter monitor October 2016 showed atrial fibrillation rate controlled. Echo 8/18 showed normal LV function; prosthetic aortic valve with mean gradient 18 mmHg, moderate biatrial enlargement, mild RVE, mild TR and moderate pulmonary hypertension. Admitted 9/18 with back pain for planned laminectomy. However he was found to have spontaneous epidural hematoma which was evacuated. Discussed with Dr Kathyrn Sheriff and he felt anticoagulation could be carefully resumed. Apixaban resumed at last ov. Since I last saw him, patient notes some increased dyspnea on exertion.  No orthopnea, PND, pedal edema, chest pain.  He is having occasional palpitations and presyncope.  These are sudden in onset and not necessarily related.  He has not had frank syncope.  Current Outpatient Medications  Medication Sig Dispense Refill  . allopurinol (ZYLOPRIM) 300 MG tablet Take 1 tablet (300 mg total) by mouth daily. 100 tablet 4  . amLODipine (NORVASC) 5 MG tablet Take 1 tablet (5 mg total) by mouth daily. 90 tablet 4  . furosemide (LASIX) 20 MG tablet TAKE ONE TABLET BY MOUTH ONCE DAILY AS NEEDED FOR  SWELLING (Patient taking differently: Take 20 mg by mouth daily as needed for fluid. TAKE ONE TABLET BY MOUTH ONCE DAILY AS NEEDED FOR  SWELLING) 100 tablet 4  . IRON PO Take 1 tablet by mouth daily.    . predniSONE (DELTASONE) 20 MG tablet TAKE ONE-HALF TABLET BY MOUTH EVERY OTHER DAY 30 tablet 0  . predniSONE (DELTASONE) 20 MG tablet TAKE ONE-HALF TABLET BY MOUTH EVERY OTHER DAY 16 tablet 7  . tamsulosin  (FLOMAX) 0.4 MG CAPS capsule Take 1 capsule (0.4 mg total) by mouth daily after breakfast. 100 capsule 5  . traMADol (ULTRAM) 50 MG tablet One half tab twice a day for chronic pain 100 tablet 4  . traZODone (DESYREL) 50 MG tablet TAKE ONE TABLET BY MOUTH AT BEDTIME AS NEEDED (Patient taking differently: Take 50 mg by mouth at bedtime. TAKE ONE TABLET BY MOUTH AT BEDTIME AS NEEDED) 90 tablet 4  . triamcinolone cream (KENALOG) 0.1 % CREAM TO AFFECTED AREA TWICE DAILY 80 g 3  . warfarin (COUMADIN) 5 MG tablet Take 1 tablet (5 mg total) by mouth daily. Pt takes this medication differently 100 tablet 4   No current facility-administered medications for this visit.      Past Medical History:  Diagnosis Date  . Aortic stenosis   . Atrial fibrillation ([place])   . Baker cyst    Left knee  . DJD (degenerative joint disease)    knees  . Dysrhythmia   . Eczema   . Erectile dysfunction   . Gout   . HTN (hypertension)   . MVP (mitral valve prolapse)   . Shortness of breath dyspnea    with activity    Past Surgical History:  Procedure Laterality Date  .  polyps vocal cord  1994  . AORTIC VALVE REPLACEMENT  ~2004  . HERNIA REPAIR  ~1978  . HERNIA REPAIR  ~1983  . LUMBAR LAMINECTOMY/DECOMPRESSION MICRODISCECTOMY N/A 01/15/2015   Procedure: 2 LEVEL DECOMPRESSIVE LUMBAR LAMINECTOMY L3-L4,L4-L5;  Surgeon: Latanya Maudlin, MD;  Location: [facility];  Service: Orthopedics;  Laterality: N/A;  .  LUMBAR LAMINECTOMY/DECOMPRESSION MICRODISCECTOMY N/A 05/01/2017   Procedure: Lumbar one-Lumbar two, Lumbar two-Lumbar three, Lumbar three-Lumbar four Laminectomy; Evacuation of hematoma;  Surgeon: Consuella Lose, MD;  Location: [place];  Service: Neurosurgery;  Laterality: N/A;  . right total hip  ~4 years ago  . ROTATOR CUFF REPAIR Left ~2009  . TOTAL KNEE ARTHROPLASTY Left 04/14/2015   Procedure: LEFT TOTAL KNEE ARTHROPLASTY;  Surgeon: Latanya Maudlin, MD;  Location: [facility];  Service: Orthopedics;  Laterality: Left;     Social History   Socioeconomic History  . Marital status: Divorced    Spouse name: Not on file  . Number of children: Not on file  . Years of education: Not on file  . Highest education level: Not on file  Occupational History  . Not on file  Social Needs  . Financial resource strain: Not on file  . Food insecurity:    Worry: Not on file    Inability: Not on file  . Transportation needs:    Medical: Not on file    Non-medical: Not on file  Tobacco Use  . Smoking status: Former Smoker    Years: 30.00    Types: Cigarettes    Last attempt to quit: 08/28/2002    Years since quitting: 15.2  . Smokeless tobacco: Never Used  Substance and Sexual Activity  . Alcohol use: Yes    Comment: social  . Drug use: No  . Sexual activity: Not on file  Lifestyle  . Physical activity:    Days per week: Not on file    Minutes per session: Not on file  . Stress: Not on file  Relationships  . Social connections:    Talks on phone: Not on file    Gets together: Not on file    Attends religious service: Not on file    Active member of club or organization: Not on file    Attends meetings of clubs or organizations: Not on file    Relationship status: Not on file  . Intimate partner violence:    Fear of current or ex partner: Not on file    Emotionally abused: Not on file    Physically abused: Not on file    Forced sexual activity: Not on file  Other Topics Concern  . Not on file  Social History Narrative  . Not on file    Family History  Problem Relation Age of Onset  . Diabetes Unknown   . Stroke Unknown     ROS: no fevers or chills, productive cough, hemoptysis, dysphasia, odynophagia, melena, hematochezia, dysuria, hematuria, rash, seizure activity, orthopnea, PND, pedal edema, claudication. Remaining systems are negative.  Physical Exam: Well-developed well-nourished in no acute distress.  Skin is warm and dry.  HEENT is normal.  Neck is supple.  Chest is clear to  auscultation with normal expansion.  Cardiovascular exam is irregular, 2/6 systolic murmur left sternal border. Abdominal exam nontender or distended. No masses palpated. Extremities show no edema. neuro grossly intact  ECG-atrial fibrillation at a rate of 79.  No ST changes.  Personally reviewed  A/P  1 permanent atrial fibrillation-.  His rate is controlled on no medications.  He has had no problems since reinitiation of anticoagulation.  We will continue with coumadin.  It should be noted he did have a prior epidural hematoma but after discussions with neurosurgery it was felt anticoagulation could be reinitiated.  2 hypertension-blood pressure is controlled.  Continue present medications.  3 status post aortic valve replacement-continue  SBE prophylaxis.  4 dyspnea-etiology unclear.  He is not particularly volume overloaded on examination.  Check BNP and PA and lateral chest x-ray.  Repeat echocardiogram.  5 near syncope-patient having near syncopal episodes and palpitations.  Schedule event monitor to further assess.  Kirk Ruths, MD

## 2017-11-23 ENCOUNTER — Ambulatory Visit: Payer: Medicare Other | Admitting: Cardiology

## 2017-11-23 ENCOUNTER — Ambulatory Visit (INDEPENDENT_AMBULATORY_CARE_PROVIDER_SITE_OTHER): Payer: Medicare Other | Admitting: General Practice

## 2017-11-23 ENCOUNTER — Ambulatory Visit (HOSPITAL_COMMUNITY)
Admission: RE | Admit: 2017-11-23 | Discharge: 2017-11-23 | Disposition: A | Discharge status: Home / Self Care | Payer: Medicare Other | Source: Ambulatory Visit | Attending: Cardiology | Admitting: Cardiology

## 2017-11-23 ENCOUNTER — Telehealth: Payer: Self-pay | Admitting: *Deleted

## 2017-11-23 ENCOUNTER — Encounter: Payer: Self-pay | Admitting: Cardiology

## 2017-11-23 VITALS — BP 132/70 | HR 79 | Ht 68.0 in | Wt 179.6 lb

## 2017-11-23 DIAGNOSIS — J984 Other disorders of lung: Secondary | ICD-10-CM | POA: Insufficient documentation

## 2017-11-23 DIAGNOSIS — R0602 Shortness of breath: Secondary | ICD-10-CM | POA: Insufficient documentation

## 2017-11-23 DIAGNOSIS — I517 Cardiomegaly: Secondary | ICD-10-CM | POA: Insufficient documentation

## 2017-11-23 DIAGNOSIS — Z952 Presence of prosthetic heart valve: Secondary | ICD-10-CM | POA: Diagnosis not present

## 2017-11-23 DIAGNOSIS — I482 Chronic atrial fibrillation: Secondary | ICD-10-CM | POA: Diagnosis not present

## 2017-11-23 DIAGNOSIS — J9811 Atelectasis: Secondary | ICD-10-CM | POA: Insufficient documentation

## 2017-11-23 DIAGNOSIS — Z7901 Long term (current) use of anticoagulants: Secondary | ICD-10-CM

## 2017-11-23 DIAGNOSIS — R002 Palpitations: Secondary | ICD-10-CM

## 2017-11-23 DIAGNOSIS — R55 Syncope and collapse: Secondary | ICD-10-CM

## 2017-11-23 DIAGNOSIS — I4821 Permanent atrial fibrillation: Secondary | ICD-10-CM

## 2017-11-23 DIAGNOSIS — I359 Nonrheumatic aortic valve disorder, unspecified: Secondary | ICD-10-CM

## 2017-11-23 DIAGNOSIS — I4891 Unspecified atrial fibrillation: Secondary | ICD-10-CM

## 2017-11-23 DIAGNOSIS — R609 Edema, unspecified: Secondary | ICD-10-CM

## 2017-11-23 LAB — POCT INR: INR: 2.8

## 2017-11-23 LAB — PRO B NATRIURETIC PEPTIDE: NT-Pro BNP: 1126 pg/mL — ABNORMAL HIGH (ref 0–486)

## 2017-11-23 MED ORDER — FUROSEMIDE 40 MG PO TABS: 40.0000 mg | ORAL_TABLET | Freq: Every day | ORAL | 3 refills | Status: DC

## 2017-11-23 NOTE — Patient Instructions (Addendum)
Pre visit review using our clinic review tool, if applicable. No additional management support is needed unless otherwise documented below in the visit note.  Continue to take 1/2 tablet daily except 1 tablet on Mondays/Wednesdays and Fridays.  Re-check in 6 weeks per patient request.

## 2017-11-23 NOTE — Patient Instructions (Addendum)
Medication Instructions:   NO CHANGE   Labwork:  Your physician recommends that you HAVE LAB WORK TODAY  Testing/Procedures:  A chest x-ray takes a picture of the organs and structures inside the chest, including the heart, lungs, and blood vessels. This test can show several things, including, whether the heart is enlarges; whether fluid is building up in the lungs; and whether pacemaker / defibrillator leads are still in place. AT [facility]  Your physician has recommended that you wear a 30 DAY event monitor. Event monitors are medical devices that record the heart's electrical activity. Doctors most often [place] these monitors to diagnose arrhythmias. Arrhythmias are problems with the speed or rhythm of the heartbeat. The monitor is a small, portable device. You can wear one while you do your normal daily activities. This is usually used to diagnose what is causing palpitations/syncope (passing out).   Your physician has requested that you have an echocardiogram. Echocardiography is a painless test that uses sound waves to create images of your heart. It provides your doctor with information about the size and shape of your heart and how well your heart's chambers and valves are working. This procedure takes approximately one hour. There are no restrictions for this procedure.    Follow-Up:  Your physician recommends that you schedule a follow-up appointment in: [place]   If you need a refill on your cardiac medications before your next appointment, please call your pharmacy.

## 2017-11-23 NOTE — Telephone Encounter (Addendum)
-----   Message from Lelon Perla, MD sent at 11/23/2017  5:23 PM EDT ----- Take lasix 20 mg daily; bmet one week. [name]  Spoke with pt, he is currently taking 20 mg of furosemide daily, per dr Stanford Breed, he will increase furosemide to 40 mg once daily. Lab orders mailed to the pt

## 2017-11-26 ENCOUNTER — Ambulatory Visit: Payer: Medicare Other | Admitting: Physician Assistant

## 2017-11-26 ENCOUNTER — Encounter: Payer: Self-pay | Admitting: Physician Assistant

## 2017-11-26 ENCOUNTER — Ambulatory Visit (INDEPENDENT_AMBULATORY_CARE_PROVIDER_SITE_OTHER): Payer: Medicare Other | Admitting: Pharmacist

## 2017-11-26 ENCOUNTER — Telehealth: Payer: Self-pay | Admitting: Cardiology

## 2017-11-26 VITALS — BP 160/75 | HR 82 | Ht 67.5 in | Wt 179.8 lb

## 2017-11-26 DIAGNOSIS — Z5181 Encounter for therapeutic drug level monitoring: Secondary | ICD-10-CM | POA: Diagnosis not present

## 2017-11-26 DIAGNOSIS — I4821 Permanent atrial fibrillation: Secondary | ICD-10-CM

## 2017-11-26 DIAGNOSIS — Z952 Presence of prosthetic heart valve: Secondary | ICD-10-CM | POA: Diagnosis not present

## 2017-11-26 DIAGNOSIS — Z7901 Long term (current) use of anticoagulants: Secondary | ICD-10-CM

## 2017-11-26 DIAGNOSIS — I4891 Unspecified atrial fibrillation: Secondary | ICD-10-CM

## 2017-11-26 DIAGNOSIS — I482 Chronic atrial fibrillation: Secondary | ICD-10-CM

## 2017-11-26 DIAGNOSIS — H1132 Conjunctival hemorrhage, left eye: Secondary | ICD-10-CM

## 2017-11-26 LAB — POCT INR: INR: 2.2

## 2017-11-26 NOTE — Progress Notes (Signed)
Cardiology Office Note   Date:  11/26/2017   ID:  [name], DOB 11-22-1935, MRN [id number]  PCP:  Dorena Cookey, MD  Cardiologist:  Dr Stanford Breed, 11/23/2017  Rosaria Ferries, PA-C   Chief Complaint  Patient presents with  . Follow-up    History of Present Illness: [name] is a 82 y.o. male with a history of AS s/p peric tissue valve w/ thoracic aneurysm repair 2006, perm afib on coumadin, non-obs carotid dz, back surgery 04/2017 w/ spont epidural hematoma>>evacuated>>anticoag eventually resumed  03/29 office visit, pt w/ DOE, BNP, CXR, echo ordered, event monitor for near-syncope w/ palpitations. 04/01 phone notes regarding bleeding in his eye>>appt made  [name] presents for cardiology follow up.  He has increased the Lasix as directed. He has not really done anything to notice if his breathing is any different.  His weight is unchanged.  He is not waking with lower extremity edema.  He is not having orthopnea or PND.  He woke Sunday morning with the subconjunctival hemorrhage.  There was no pain.  His vision is fine.  He sees it in the mirror, but is otherwise unaware of it.  No med changes other than the Lasix, no antibiotics, no new OTC meds.  No other bleeding issues.  No change in eating habits.   No chest pain.   No URI sx, no coughing or sneezing.  Blood pressure is higher than usual in the office today, but has not been running high at home.   Past Medical History:  Diagnosis Date  . Aortic stenosis   . Atrial fibrillation ([place])   . Baker cyst    Left knee  . DJD (degenerative joint disease)    knees  . Dysrhythmia   . Eczema   . Erectile dysfunction   . Gout   . HTN (hypertension)   . MVP (mitral valve prolapse)   . Shortness of breath dyspnea    with activity    Past Surgical History:  Procedure Laterality Date  .  polyps vocal cord  1994  . AORTIC VALVE REPLACEMENT  ~2004  . HERNIA REPAIR  ~1978  . HERNIA REPAIR  ~1983    . LUMBAR LAMINECTOMY/DECOMPRESSION MICRODISCECTOMY N/A 01/15/2015   Procedure: 2 LEVEL DECOMPRESSIVE LUMBAR LAMINECTOMY L3-L4,L4-L5;  Surgeon: Latanya Maudlin, MD;  Location: [facility];  Service: Orthopedics;  Laterality: N/A;  . LUMBAR LAMINECTOMY/DECOMPRESSION MICRODISCECTOMY N/A 05/01/2017   Procedure: Lumbar one-Lumbar two, Lumbar two-Lumbar three, Lumbar three-Lumbar four Laminectomy; Evacuation of hematoma;  Surgeon: Consuella Lose, MD;  Location: [place];  Service: Neurosurgery;  Laterality: N/A;  . right total hip  ~4 years ago  . ROTATOR CUFF REPAIR Left ~2009  . TOTAL KNEE ARTHROPLASTY Left 04/14/2015   Procedure: LEFT TOTAL KNEE ARTHROPLASTY;  Surgeon: Latanya Maudlin, MD;  Location: [facility];  Service: Orthopedics;  Laterality: Left;    Current Outpatient Medications  Medication Sig Dispense Refill  . allopurinol (ZYLOPRIM) 300 MG tablet Take 1 tablet (300 mg total) by mouth daily. 100 tablet 4  . amLODipine (NORVASC) 5 MG tablet Take 1 tablet (5 mg total) by mouth daily. 90 tablet 4  . furosemide (LASIX) 40 MG tablet Take 1 tablet (40 mg total) by mouth daily. TAKE ONE TABLET BY MOUTH ONCE DAILY 90 tablet 3  . IRON PO Take 1 tablet by mouth daily.    . predniSONE (DELTASONE) 20 MG tablet TAKE ONE-HALF TABLET BY MOUTH EVERY OTHER DAY 30 tablet 0  .  predniSONE (DELTASONE) 20 MG tablet TAKE ONE-HALF TABLET BY MOUTH EVERY OTHER DAY 16 tablet 7  . tamsulosin (FLOMAX) 0.4 MG CAPS capsule Take 1 capsule (0.4 mg total) by mouth daily after breakfast. 100 capsule 5  . traMADol (ULTRAM) 50 MG tablet One half tab twice a day for chronic pain 100 tablet 4  . traZODone (DESYREL) 50 MG tablet TAKE ONE TABLET BY MOUTH AT BEDTIME AS NEEDED (Patient taking differently: Take 50 mg by mouth at bedtime. TAKE ONE TABLET BY MOUTH AT BEDTIME AS NEEDED) 90 tablet 4  . triamcinolone cream (KENALOG) 0.1 % CREAM TO AFFECTED AREA TWICE DAILY 80 g 3  . warfarin (COUMADIN) 5 MG tablet Take 1 tablet (5 mg total) by  mouth daily. Pt takes this medication differently 100 tablet 4   No current facility-administered medications for this visit.     Allergies:   Patient has no known allergies.    Social History:  The patient  reports that he quit smoking about 15 years ago. His smoking use included cigarettes. He quit after 30.00 years of use. He has never used smokeless tobacco. He reports that he drinks alcohol. He reports that he does not use drugs.   Family History:  The patient's family history includes Diabetes in his unknown relative; Stroke in his unknown relative.    ROS:  Please see the history of present illness. All other systems are reviewed and negative.    PHYSICAL EXAM: VS:  BP (!) 160/75   Pulse 82   Ht 5' 7.5" (1.715 m)   Wt 179 lb 12.8 oz (81.6 kg)   BMI 27.75 kg/m  , BMI Body mass index is 27.75 kg/m. GEN: Well nourished, well developed, male in no acute distress  HEENT: normal for age except for hemorrhage, see photo  Neck: JVD 8-9 cm, no carotid bruit, no masses Cardiac: Irreg R&R; +valve click, 2/6 murmur, no rubs, or gallops Respiratory: Decreased breath sounds bases with a few scattered rales bilaterally, normal work of breathing GI: soft, nontender, nondistended, + BS MS: no deformity or atrophy; no edema; distal pulses are 2+ in all 4 extremities   Skin: warm and dry, no rash Neuro:  Strength and sensation are intact Psych: euthymic mood, full affect    EKG:  EKG is not ordered today.  Recent Labs: 09/11/2017: ALT 11; BUN 15; Creatinine, Ser 0.82; Hemoglobin 14.3; Platelets 222.0; Potassium 3.5; Sodium 140 09/18/2017: TSH 0.55 11/23/2017: NT-Pro BNP 1,126    Lipid Panel    Component Value Date/Time   CHOL 202 (H) 09/07/2016 0824   TRIG 140.0 09/07/2016 0824   HDL 56.70 09/07/2016 0824   CHOLHDL 4 09/07/2016 0824   VLDL 28.0 09/07/2016 0824   LDLCALC 117 (H) 09/07/2016 0824     Wt Readings from Last 3 Encounters:  11/26/17 179 lb 12.8 oz (81.6 kg)    11/23/17 179 lb 9.6 oz (81.5 kg)  09/11/17 179 lb (81.2 kg)     Other studies Reviewed: Additional studies/ records that were reviewed today include: Office notes, hospital records and testing.  ASSESSMENT AND PLAN:  1.  Subconjunctival hemorrhage: His vision is fine.  His INR is 2.2.  Dr. Stanford Breed advised that it is okay to continue Coumadin and follow-up as scheduled.  2.  Permanent atrial fibrillation: No palpitations and heart rate is controlled  3.  Aortic valve replacement: Keep appointment to get his echo rechecked  4.  Hypertension: According to the patient, his blood pressure runs in the  130s at home.  He thinks he might be a little high today because of anxiety about his eye.  No medication changes for now.  He is encouraged to track his blood pressure and let us know if it runs higher.   Current medicines are reviewed at length with the patient today.  The patient does not have concerns regarding medicines.  The following changes have been made:  no change  Labs/ tests ordered today include:  No orders of the defined types were placed in this encounter.    Disposition:   FU with Dr. Stanford Breed  Signed, Rosaria Ferries, PA-C  11/26/2017 3:06 PM    [place] Phone: [phone]; Fax: [phone]  This note was written with the assistance of speech recognition software. Please excuse any transcriptional errors.

## 2017-11-26 NOTE — Patient Instructions (Signed)
Medication Instructions:  Your physician recommends that you continue on your current medications as directed. Please refer to the Current Medication list given to you today.  OK to try Visine over the counter eye drops  Follow-Up: As scheduled with Dr. Stanford Breed  Any Other Special Instructions Will Be Listed Below (If Applicable).     If you need a refill on your cardiac medications before your next appointment, please call your pharmacy.

## 2017-11-26 NOTE — Telephone Encounter (Signed)
Spoke with pt and message was taken wrong no issues with bleeding when going to bathroom has bleeding to left eye Lasix has been recently increased to 40 mg.Will forward to Dr Stanford Breed for review and recommendations ./cy

## 2017-11-26 NOTE — Telephone Encounter (Signed)
Pt aware of recommendations and appt was made for today with Rhonda Barrett at 3:00 pm .Adonis Housekeeper

## 2017-11-26 NOTE — Telephone Encounter (Signed)
Have pt see pa; ? How bad is bleeding; can hold coumadin until seen. Kirk Ruths

## 2017-11-26 NOTE — Telephone Encounter (Signed)
Pt c/o medication issue:  1. Name of Medication:  furosemide (LASIX) 40 MG tablet    2. How are you currently taking this medication (dosage and times per day)? Take 1 tablet (40 mg total) by mouth daily. TAKE ONE TABLET BY MOUTH ONCE DAILY 3. Are you having a reaction (difficulty breathing--STAT)? no  4. What is your medication issue? Pt verbalized that he was advised to take an extra dose and since then he has been bleeding when he uses the bathroom

## 2017-11-27 ENCOUNTER — Telehealth: Payer: Self-pay | Admitting: Physician Assistant

## 2017-11-27 NOTE — Telephone Encounter (Signed)
DId not need this encounter

## 2017-12-07 ENCOUNTER — Other Ambulatory Visit: Payer: Self-pay | Admitting: Family Medicine

## 2017-12-07 DIAGNOSIS — R609 Edema, unspecified: Secondary | ICD-10-CM | POA: Diagnosis not present

## 2017-12-07 LAB — BASIC METABOLIC PANEL
BUN/Creatinine Ratio: 20 (ref 10–24)
BUN: 16 mg/dL (ref 8–27)
CO2: 27 mmol/L (ref 20–29)
Calcium: 9 mg/dL (ref 8.6–10.2)
Chloride: 99 mmol/L (ref 96–106)
Creatinine, Ser: 0.79 mg/dL (ref 0.76–1.27)
GFR calc Af Amer: 98 mL/min/{1.73_m2} (ref >59)
GFR calc non Af Amer: 85 mL/min/{1.73_m2} (ref >59)
Glucose: 86 mg/dL (ref 65–99)
Potassium: 3.5 mmol/L (ref 3.5–5.2)
Sodium: 140 mmol/L (ref 134–144)

## 2017-12-08 ENCOUNTER — Other Ambulatory Visit: Payer: Self-pay | Admitting: Family Medicine

## 2017-12-08 DIAGNOSIS — R609 Edema, unspecified: Secondary | ICD-10-CM

## 2017-12-11 ENCOUNTER — Other Ambulatory Visit: Payer: Self-pay | Admitting: Family Medicine

## 2017-12-11 DIAGNOSIS — R609 Edema, unspecified: Secondary | ICD-10-CM

## 2017-12-11 NOTE — Telephone Encounter (Signed)
Deardra, pharmacist at [facility] called stating that they did not receive this prescription on 11/23/17. Please advise. CB#: [phone]

## 2017-12-11 NOTE — Telephone Encounter (Signed)
Copied from [place] [phone]. Topic: Quick Communication - Rx Refill/Question >> Dec 11, 2017 12:47 PM Tye [place] wrote: Medication: furosemide (LASIX) 40 MG tablet [[id number]]  Pharmacy: [facility]

## 2017-12-12 ENCOUNTER — Telehealth: Payer: Self-pay | Admitting: Cardiology

## 2017-12-12 DIAGNOSIS — R609 Edema, unspecified: Secondary | ICD-10-CM

## 2017-12-12 MED ORDER — FUROSEMIDE 40 MG PO TABS: 40.0000 mg | ORAL_TABLET | Freq: Every day | ORAL | 3 refills | Status: DC

## 2017-12-12 NOTE — Telephone Encounter (Signed)
Returned call to [facility], states they do not have rx for furosemide 40 mg daily.  Per chart review this was changed and sent to [facility].   Rx resent.

## 2017-12-12 NOTE — Telephone Encounter (Signed)
[facility] states that they did not received the Rx for Lasix 40 mg sent on 11/23/2017. They need a new prescription sent for refills. Thank you

## 2017-12-12 NOTE — Telephone Encounter (Signed)
[facility] calling to discuss patient Furosemide medication

## 2017-12-13 ENCOUNTER — Ambulatory Visit (INDEPENDENT_AMBULATORY_CARE_PROVIDER_SITE_OTHER): Payer: Medicare Other

## 2017-12-13 ENCOUNTER — Telehealth: Payer: Self-pay

## 2017-12-13 ENCOUNTER — Ambulatory Visit (HOSPITAL_COMMUNITY): Payer: Medicare Other | Attending: Cardiology

## 2017-12-13 ENCOUNTER — Other Ambulatory Visit: Payer: Self-pay

## 2017-12-13 DIAGNOSIS — I4891 Unspecified atrial fibrillation: Secondary | ICD-10-CM | POA: Insufficient documentation

## 2017-12-13 DIAGNOSIS — R0602 Shortness of breath: Secondary | ICD-10-CM | POA: Diagnosis not present

## 2017-12-13 DIAGNOSIS — R55 Syncope and collapse: Secondary | ICD-10-CM

## 2017-12-13 DIAGNOSIS — R002 Palpitations: Secondary | ICD-10-CM

## 2017-12-13 DIAGNOSIS — I272 Pulmonary hypertension, unspecified: Secondary | ICD-10-CM | POA: Diagnosis not present

## 2017-12-13 DIAGNOSIS — I119 Hypertensive heart disease without heart failure: Secondary | ICD-10-CM | POA: Insufficient documentation

## 2017-12-13 DIAGNOSIS — Z953 Presence of xenogenic heart valve: Secondary | ICD-10-CM | POA: Insufficient documentation

## 2017-12-13 DIAGNOSIS — Z87891 Personal history of nicotine dependence: Secondary | ICD-10-CM | POA: Diagnosis not present

## 2017-12-13 NOTE — Telephone Encounter (Signed)
Received call from [facility] with [name] calling to report baseline EKG revealed Afib.Advised patient has known Afib.

## 2017-12-31 ENCOUNTER — Telehealth: Payer: Self-pay | Admitting: *Deleted

## 2017-12-31 NOTE — Telephone Encounter (Signed)
[name] sent in a monitor reading that the patient had a 4 second pause on 5/4. The patient stated that he was sleeping and unaware. He stated that he felt well.

## 2018-01-01 ENCOUNTER — Telehealth: Payer: Self-pay | Admitting: *Deleted

## 2018-01-01 NOTE — Telephone Encounter (Signed)
CALLED PATIENT ,[name]  FAXED STRIPS --SHOWING  PAUSE  3.2 [place]  AFIB,-  3:18 AM CENTRAL TIME. PATIENT STATES NO SYMPTOMS  NOTED STRIPS PLACED IN DR CRENSHAW BOX

## 2018-01-01 NOTE — Telephone Encounter (Signed)
Event occurred early AM hours; continue monitoring [name]

## 2018-01-02 ENCOUNTER — Telehealth: Payer: Self-pay

## 2018-01-02 NOTE — Telephone Encounter (Signed)
Recorded [name] strip showed sustained a fib with pvc's at 9:52 central time. Pt states he was sleeping with any further complaints. Routing to MD

## 2018-01-04 ENCOUNTER — Ambulatory Visit (INDEPENDENT_AMBULATORY_CARE_PROVIDER_SITE_OTHER): Payer: Medicare Other | Admitting: General Practice

## 2018-01-04 DIAGNOSIS — I4891 Unspecified atrial fibrillation: Secondary | ICD-10-CM

## 2018-01-04 DIAGNOSIS — Z7901 Long term (current) use of anticoagulants: Secondary | ICD-10-CM | POA: Diagnosis not present

## 2018-01-04 LAB — POCT INR: INR: 4.6

## 2018-01-04 NOTE — Patient Instructions (Addendum)
Pre visit review using our clinic review tool, if applicable. No additional management support is needed unless otherwise documented below in the visit note.  Hold coumadin Saturday and Sunday and then start taking 1/2 tablet daily except 1 tablet on Monday and Friday.  Re-check in 2 weeks.

## 2018-01-15 ENCOUNTER — Telehealth: Payer: Self-pay | Admitting: *Deleted

## 2018-01-15 NOTE — Telephone Encounter (Addendum)
Spoke with pt regarding event monitor results. He is not interested in seeing anyone right now about a pacemaker. He reports since we increased his lasix he feels great. He will call if he develops problems. Will make dr Stanford Breed aware.  ----- Message from Lelon Perla, MD sent at 01/14/2018  5:26 PM EDT ----- Please ask pt to see EP; unclear if he will need pacemaker but would like him evaluated. Kirk Ruths

## 2018-01-22 ENCOUNTER — Ambulatory Visit: Payer: Medicare Other | Admitting: General Practice

## 2018-01-22 DIAGNOSIS — Z7901 Long term (current) use of anticoagulants: Secondary | ICD-10-CM

## 2018-01-22 DIAGNOSIS — I4891 Unspecified atrial fibrillation: Secondary | ICD-10-CM

## 2018-01-22 LAB — POCT INR: INR: 3.9 — AB (ref 2.0–3.0)

## 2018-01-22 NOTE — Patient Instructions (Signed)
Pre visit review using our clinic review tool, if applicable. No additional management support is needed unless otherwise documented below in the visit note.  Hold coumadin Wednesday and then change dosage and start taking 1/2 tablet daily except 1 tablet on Monday.  Re-check in 3 weeks.

## 2018-02-12 ENCOUNTER — Ambulatory Visit (INDEPENDENT_AMBULATORY_CARE_PROVIDER_SITE_OTHER): Payer: Medicare Other | Admitting: General Practice

## 2018-02-12 DIAGNOSIS — Z7901 Long term (current) use of anticoagulants: Secondary | ICD-10-CM | POA: Diagnosis not present

## 2018-02-12 DIAGNOSIS — I4891 Unspecified atrial fibrillation: Secondary | ICD-10-CM

## 2018-02-12 LAB — POCT INR: INR: 2.2 (ref 2.0–3.0)

## 2018-02-12 NOTE — Patient Instructions (Addendum)
Pre visit review using our clinic review tool, if applicable. No additional management support is needed unless otherwise documented below in the visit note.  Continue to take 1/2 tablet daily except 1 tablet on Monday.  Re-check in 4 weeks.

## 2018-02-20 NOTE — Progress Notes (Deleted)
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 Preoperative cardiac catheterization revealed normal coronary arteries. He has permanent atrial fibrillation. Previous carotid Dopplers due to bruits revealed 0-39% bilateral stenosis. CTA in April of 2014 showed atheromatous thoracic aorta without aneurysm, dissection or stenosis. Holter monitor October 2016 showed atrial fibrillation rate controlled. Admitted 9/18 with back pain for planned laminectomy. However he was found to have spontaneous epidural hematoma which was evacuated. Discussed with Dr Kathyrn Sheriff and he felt anticoagulation could be carefully resumed. Apixaban resumed at last ov.   Echocardiogram repeated April 2019 and showed normal LV systolic function, mild left ventricular hypertrophy, bioprosthetic aortic valve with mean gradient 13 mmHg, moderate to severe left atrial enlargement, mild right ventricular enlargement, moderate to severe right atrial enlargement.  Event monitor April 2019 showed atrial fibrillation with PVCs or aberrantly conducted beats and multiple pauses greater than 3 seconds (longest 4 seconds) all occurring late p.m./early a.m. hours.  Since I last saw him,   Current Outpatient Medications  Medication Sig Dispense Refill  . allopurinol (ZYLOPRIM) 300 MG tablet TAKE ONE TABLET BY MOUTH ONCE DAILY 90 tablet 5  . amLODipine (NORVASC) 5 MG tablet TAKE ONE TABLET BY MOUTH ONCE DAILY 90 tablet 4  . furosemide (LASIX) 40 MG tablet Take 1 tablet (40 mg total) by mouth daily. TAKE ONE TABLET BY MOUTH ONCE DAILY 90 tablet 3  . IRON PO Take 1 tablet by mouth daily.    . predniSONE (DELTASONE) 20 MG tablet TAKE ONE-HALF TABLET BY MOUTH EVERY OTHER DAY 30 tablet 0  . predniSONE (DELTASONE) 20 MG tablet TAKE ONE-HALF TABLET BY MOUTH EVERY OTHER DAY 16 tablet 7  . tamsulosin (FLOMAX) 0.4 MG CAPS capsule TAKE ONE CAPSULE BY MOUTH  ONCE DAILY AFTER BREAKFAST 90 capsule 6  . traMADol (ULTRAM) 50 MG tablet One half tab twice a day for chronic pain 100 tablet 4  . traZODone (DESYREL) 50 MG tablet TAKE ONE TABLET BY MOUTH AT BEDTIME AS NEEDED 90 tablet 4  . triamcinolone cream (KENALOG) 0.1 % CREAM TO AFFECTED AREA TWICE DAILY 80 g 3  . warfarin (COUMADIN) 5 MG tablet Take 1 tablet (5 mg total) by mouth daily. Pt takes this medication differently 100 tablet 4   No current facility-administered medications for this visit.      Past Medical History:  Diagnosis Date  . Aortic stenosis   . Atrial fibrillation ([place])   . Baker cyst    Left knee  . DJD (degenerative joint disease)    knees  . Dysrhythmia   . Eczema   . Erectile dysfunction   . Gout   . HTN (hypertension)   . MVP (mitral valve prolapse)   . Shortness of breath dyspnea    with activity    Past Surgical History:  Procedure Laterality Date  .  polyps vocal cord  1994  . AORTIC VALVE REPLACEMENT  ~2004  . HERNIA REPAIR  ~1978  . HERNIA REPAIR  ~1983  . LUMBAR LAMINECTOMY/DECOMPRESSION MICRODISCECTOMY N/A 01/15/2015   Procedure: 2 LEVEL DECOMPRESSIVE LUMBAR LAMINECTOMY L3-L4,L4-L5;  Surgeon: Latanya Maudlin, MD;  Location: [facility];  Service: Orthopedics;  Laterality: N/A;  . LUMBAR LAMINECTOMY/DECOMPRESSION MICRODISCECTOMY N/A 05/01/2017   Procedure: Lumbar one-Lumbar two, Lumbar two-Lumbar three, Lumbar three-Lumbar four Laminectomy; Evacuation of hematoma;  Surgeon: Consuella Lose, MD;  Location: [facility];  Service: Neurosurgery;  Laterality: N/A;  . right  total hip  ~4 years ago  . ROTATOR CUFF REPAIR Left ~2009  . TOTAL KNEE ARTHROPLASTY Left 04/14/2015   Procedure: LEFT TOTAL KNEE ARTHROPLASTY;  Surgeon: Latanya Maudlin, MD;  Location: [facility];  Service: Orthopedics;  Laterality: Left;    Social History   Socioeconomic History  . Marital status: Divorced    Spouse name: Not on file  . Number of children: Not on file  . Years of education: Not on  file  . Highest education level: Not on file  Occupational History  . Not on file  Social Needs  . Financial resource strain: Not on file  . Food insecurity:    Worry: Not on file    Inability: Not on file  . Transportation needs:    Medical: Not on file    Non-medical: Not on file  Tobacco Use  . Smoking status: Former Smoker    Years: 30.00    Types: Cigarettes    Last attempt to quit: 08/28/2002    Years since quitting: 15.4  . Smokeless tobacco: Never Used  Substance and Sexual Activity  . Alcohol use: Yes    Comment: social  . Drug use: No  . Sexual activity: Not on file  Lifestyle  . Physical activity:    Days per week: Not on file    Minutes per session: Not on file  . Stress: Not on file  Relationships  . Social connections:    Talks on phone: Not on file    Gets together: Not on file    Attends religious service: Not on file    Active member of club or organization: Not on file    Attends meetings of clubs or organizations: Not on file    Relationship status: Not on file  . Intimate partner violence:    Fear of current or ex partner: Not on file    Emotionally abused: Not on file    Physically abused: Not on file    Forced sexual activity: Not on file  Other Topics Concern  . Not on file  Social History Narrative  . Not on file    Family History  Problem Relation Age of Onset  . Diabetes Unknown   . Stroke Unknown     ROS: no fevers or chills, productive cough, hemoptysis, dysphasia, odynophagia, melena, hematochezia, dysuria, hematuria, rash, seizure activity, orthopnea, PND, pedal edema, claudication. Remaining systems are negative.  Physical Exam: Well-developed well-nourished in no acute distress.  Skin is warm and dry.  HEENT is normal.  Neck is supple.  Chest is clear to auscultation with normal expansion.  Cardiovascular exam is regular rate and rhythm.  Abdominal exam nontender or distended. No masses palpated. Extremities show no  edema. neuro grossly intact  ECG- personally reviewed  A/P  1  Kirk Ruths, MD

## 2018-03-01 ENCOUNTER — Ambulatory Visit: Payer: Medicare Other | Admitting: Cardiology

## 2018-03-12 ENCOUNTER — Ambulatory Visit (INDEPENDENT_AMBULATORY_CARE_PROVIDER_SITE_OTHER): Payer: Medicare Other | Admitting: General Practice

## 2018-03-12 DIAGNOSIS — I4891 Unspecified atrial fibrillation: Secondary | ICD-10-CM

## 2018-03-12 DIAGNOSIS — Z7901 Long term (current) use of anticoagulants: Secondary | ICD-10-CM

## 2018-03-12 LAB — POCT INR: INR: 2.1 (ref 2.0–3.0)

## 2018-03-12 NOTE — Patient Instructions (Addendum)
Pre visit review using our clinic review tool, if applicable. No additional management support is needed unless otherwise documented below in the visit note.  Continue to take 1/2 tablet daily except 1 tablet on Monday.  Re-check in 6 weeks.

## 2018-04-12 ENCOUNTER — Other Ambulatory Visit: Payer: Self-pay | Admitting: Family Medicine

## 2018-04-15 ENCOUNTER — Other Ambulatory Visit: Payer: Self-pay | Admitting: Family Medicine

## 2018-04-15 MED ORDER — TRAMADOL HCL 50 MG PO TABS: ORAL_TABLET | ORAL | 4 refills | Status: DC

## 2018-04-23 ENCOUNTER — Ambulatory Visit (INDEPENDENT_AMBULATORY_CARE_PROVIDER_SITE_OTHER): Payer: Medicare Other | Admitting: General Practice

## 2018-04-23 DIAGNOSIS — Z7901 Long term (current) use of anticoagulants: Secondary | ICD-10-CM | POA: Diagnosis not present

## 2018-04-23 DIAGNOSIS — I4891 Unspecified atrial fibrillation: Secondary | ICD-10-CM

## 2018-04-23 LAB — POCT INR: INR: 2.2 (ref 2.0–3.0)

## 2018-04-23 NOTE — Patient Instructions (Addendum)
Pre visit review using our clinic review tool, if applicable. No additional management support is needed unless otherwise documented below in the visit note.  Continue to take 1/2 tablet daily except 1 tablet on Monday.  Re-check in 6 weeks.

## 2018-05-15 DIAGNOSIS — H2513 Age-related nuclear cataract, bilateral: Secondary | ICD-10-CM | POA: Diagnosis not present

## 2018-05-24 NOTE — Progress Notes (Signed)
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 Preoperative cardiac catheterization revealed normal coronary arteries. He has permanent atrial fibrillation. Previous carotid Dopplers due to bruits revealed 0-39% bilateral stenosis. Admitted 9/18 with back pain for planned laminectomy. However he was found to have spontaneous epidural hematoma which was evacuated. Discussed with Dr Kathyrn Sheriff and he felt anticoagulation could be carefully resumed. Apixaban resumed at previous ov.   Last echocardiogram April 2019 showed normal LV function, bioprosthetic aortic valve with mean gradient 13 mmHg, moderate to severe left atrial enlargement, moderate to severe right atrial enlargement, mild right ventricular enlargement.  Monitor April 2019 showed atrial fibrillation multiple pauses greater than 3 seconds all occurring late p.m. or early a.m. hours.  Lasix increased at last office visit because of dyspnea and elevated BNP.  We asked patient to be seen by electrophysiology following results of last event monitor for consideration of pacemaker.  Patient declined.  Since I last saw him, the patient has dyspnea with more extreme activities but not with routine activities. It is relieved with rest. It is not associated with chest pain. There is no orthopnea, PND or pedal edema. There is no syncope or palpitations. There is no exertional chest pain.   Current Outpatient Medications  Medication Sig Dispense Refill  . allopurinol (ZYLOPRIM) 300 MG tablet TAKE ONE TABLET BY MOUTH ONCE DAILY 90 tablet 5  . amLODipine (NORVASC) 5 MG tablet TAKE ONE TABLET BY MOUTH ONCE DAILY 90 tablet 4  . furosemide (LASIX) 40 MG tablet Take 1 tablet (40 mg total) by mouth daily. TAKE ONE TABLET BY MOUTH ONCE DAILY 90 tablet 3  . IRON PO Take 1 tablet by mouth daily.    . predniSONE (DELTASONE) 20 MG tablet TAKE ONE-HALF TABLET BY MOUTH  EVERY OTHER DAY 30 tablet 0  . predniSONE (DELTASONE) 20 MG tablet TAKE ONE-HALF TABLET BY MOUTH EVERY OTHER DAY 16 tablet 7  . tamsulosin (FLOMAX) 0.4 MG CAPS capsule TAKE ONE CAPSULE BY MOUTH ONCE DAILY AFTER BREAKFAST 90 capsule 6  . traMADol (ULTRAM) 50 MG tablet One half tab twice a day for chronic pain 100 tablet 4  . traZODone (DESYREL) 50 MG tablet TAKE ONE TABLET BY MOUTH AT BEDTIME AS NEEDED 90 tablet 4  . triamcinolone cream (KENALOG) 0.1 % CREAM TO AFFECTED AREA TWICE DAILY 80 g 3  . warfarin (COUMADIN) 5 MG tablet Take 1 tablet (5 mg total) by mouth daily. Pt takes this medication differently 100 tablet 4   No current facility-administered medications for this visit.      Past Medical History:  Diagnosis Date  . Aortic stenosis   . Atrial fibrillation ([place])   . Baker cyst    Left knee  . DJD (degenerative joint disease)    knees  . Dysrhythmia   . Eczema   . Erectile dysfunction   . Gout   . HTN (hypertension)   . MVP (mitral valve prolapse)   . Shortness of breath dyspnea    with activity    Past Surgical History:  Procedure Laterality Date  .  polyps vocal cord  1994  . AORTIC VALVE REPLACEMENT  ~2004  . HERNIA REPAIR  ~1978  . HERNIA REPAIR  ~1983  . LUMBAR LAMINECTOMY/DECOMPRESSION MICRODISCECTOMY N/A 01/15/2015   Procedure: 2 LEVEL DECOMPRESSIVE LUMBAR LAMINECTOMY L3-L4,L4-L5;  Surgeon: Latanya Maudlin, MD;  Location: [facility];  Service: Orthopedics;  Laterality:  N/A;  . LUMBAR LAMINECTOMY/DECOMPRESSION MICRODISCECTOMY N/A 05/01/2017   Procedure: Lumbar one-Lumbar two, Lumbar two-Lumbar three, Lumbar three-Lumbar four Laminectomy; Evacuation of hematoma;  Surgeon: Consuella Lose, MD;  Location: [facility];  Service: Neurosurgery;  Laterality: N/A;  . right total hip  ~4 years ago  . ROTATOR CUFF REPAIR Left ~2009  . TOTAL KNEE ARTHROPLASTY Left 04/14/2015   Procedure: LEFT TOTAL KNEE ARTHROPLASTY;  Surgeon: Latanya Maudlin, MD;  Location: [facility];  Service:  Orthopedics;  Laterality: Left;    Social History   Socioeconomic History  . Marital status: Divorced    Spouse name: Not on file  . Number of children: Not on file  . Years of education: Not on file  . Highest education level: Not on file  Occupational History  . Not on file  Social Needs  . Financial resource strain: Not on file  . Food insecurity:    Worry: Not on file    Inability: Not on file  . Transportation needs:    Medical: Not on file    Non-medical: Not on file  Tobacco Use  . Smoking status: Former Smoker    Years: 30.00    Types: Cigarettes    Last attempt to quit: 08/28/2002    Years since quitting: 15.7  . Smokeless tobacco: Never Used  Substance and Sexual Activity  . Alcohol use: Yes    Comment: social  . Drug use: No  . Sexual activity: Not on file  Lifestyle  . Physical activity:    Days per week: Not on file    Minutes per session: Not on file  . Stress: Not on file  Relationships  . Social connections:    Talks on phone: Not on file    Gets together: Not on file    Attends religious service: Not on file    Active member of club or organization: Not on file    Attends meetings of clubs or organizations: Not on file    Relationship status: Not on file  . Intimate partner violence:    Fear of current or ex partner: Not on file    Emotionally abused: Not on file    Physically abused: Not on file    Forced sexual activity: Not on file  Other Topics Concern  . Not on file  Social History Narrative  . Not on file    Family History  Problem Relation Age of Onset  . Diabetes Unknown   . Stroke Unknown     ROS: no fevers or chills, productive cough, hemoptysis, dysphasia, odynophagia, melena, hematochezia, dysuria, hematuria, rash, seizure activity, orthopnea, PND, pedal edema, claudication. Remaining systems are negative.  Physical Exam: Well-developed well-nourished in no acute distress.  Skin is warm and dry.  HEENT is normal.  Neck is  supple.  Chest is clear to auscultation with normal expansion.  Cardiovascular exam is irregular, 2/6 systolic murmur Abdominal exam nontender or distended. No masses palpated. Extremities show no edema. neuro grossly intact   A/P  1 permanent atrial fibrillation-patient's heart rate is controlled on no medications.  Plan to continue Coumadin.  Goal INR 2-3.  2 bradycardia-patient noted to have pauses greater than 3 seconds but typically in the early a.m. hours.  He has not had syncope.  We will follow for now.  3 hypertension-patient's blood pressure is controlled.  Continue present medications.  4 prior aortic valve replacement-continue SBE prophylaxis.  5 chronic diastolic congestive heart failure-much improved on present dose of Lasix.  Will continue.  Check potassium and renal function.  Kirk Ruths, MD

## 2018-05-29 ENCOUNTER — Ambulatory Visit: Payer: Medicare Other | Admitting: Cardiology

## 2018-05-29 ENCOUNTER — Encounter: Payer: Self-pay | Admitting: Cardiology

## 2018-05-29 VITALS — BP 136/70 | HR 76 | Ht 67.5 in | Wt 173.0 lb

## 2018-05-29 DIAGNOSIS — R609 Edema, unspecified: Secondary | ICD-10-CM | POA: Diagnosis not present

## 2018-05-29 DIAGNOSIS — Z952 Presence of prosthetic heart valve: Secondary | ICD-10-CM | POA: Diagnosis not present

## 2018-05-29 DIAGNOSIS — I5032 Chronic diastolic (congestive) heart failure: Secondary | ICD-10-CM

## 2018-05-29 DIAGNOSIS — I4821 Permanent atrial fibrillation: Secondary | ICD-10-CM

## 2018-05-29 LAB — BASIC METABOLIC PANEL
BUN/Creatinine Ratio: 13 (ref 10–24)
BUN: 13 mg/dL (ref 8–27)
CO2: 28 mmol/L (ref 20–29)
Calcium: 9.3 mg/dL (ref 8.6–10.2)
Chloride: 98 mmol/L (ref 96–106)
Creatinine, Ser: 0.97 mg/dL (ref 0.76–1.27)
GFR calc Af Amer: 85 mL/min/{1.73_m2} (ref >59)
GFR calc non Af Amer: 73 mL/min/{1.73_m2} (ref >59)
Glucose: 89 mg/dL (ref 65–99)
Potassium: 3.6 mmol/L (ref 3.5–5.2)
Sodium: 141 mmol/L (ref 134–144)

## 2018-05-29 NOTE — Patient Instructions (Signed)
Medication Instructions:   NO CHANGE  Labwork:  Your physician recommends that you HAVE LAB WORK TODAY  Follow-Up:  Your physician recommends that you schedule a follow-up appointment in: [place]   If you need a refill on your cardiac medications before your next appointment, please call your pharmacy.

## 2018-05-30 ENCOUNTER — Encounter: Payer: Self-pay | Admitting: *Deleted

## 2018-05-31 ENCOUNTER — Ambulatory Visit: Payer: Medicare Other

## 2018-05-31 ENCOUNTER — Ambulatory Visit (INDEPENDENT_AMBULATORY_CARE_PROVIDER_SITE_OTHER): Payer: Medicare Other | Admitting: General Practice

## 2018-05-31 DIAGNOSIS — Z7901 Long term (current) use of anticoagulants: Secondary | ICD-10-CM

## 2018-05-31 DIAGNOSIS — I4891 Unspecified atrial fibrillation: Secondary | ICD-10-CM

## 2018-05-31 LAB — POCT INR: INR: 1.8 — AB (ref 2.0–3.0)

## 2018-05-31 NOTE — Patient Instructions (Addendum)
Pre visit review using our clinic review tool, if applicable. No additional management support is needed unless otherwise documented below in the visit note.  Take 1 tablet today and then continue to take 1/2 tablet daily except 1 tablet on Monday.  Re-check in 6 weeks.

## 2018-05-31 NOTE — Progress Notes (Signed)
Agree with management.  Binnie Rail, MD

## 2018-07-09 ENCOUNTER — Other Ambulatory Visit: Payer: Self-pay | Admitting: Family Medicine

## 2018-07-09 NOTE — Telephone Encounter (Signed)
Okay for refill? Please advise 

## 2018-07-12 ENCOUNTER — Ambulatory Visit (INDEPENDENT_AMBULATORY_CARE_PROVIDER_SITE_OTHER): Payer: Medicare Other | Admitting: General Practice

## 2018-07-12 DIAGNOSIS — I4891 Unspecified atrial fibrillation: Secondary | ICD-10-CM

## 2018-07-12 DIAGNOSIS — Z7901 Long term (current) use of anticoagulants: Secondary | ICD-10-CM | POA: Diagnosis not present

## 2018-07-12 LAB — POCT INR: INR: 1.5 — AB (ref 2.0–3.0)

## 2018-07-12 NOTE — Patient Instructions (Addendum)
Pre visit review using our clinic review tool, if applicable. No additional management support is needed unless otherwise documented below in the visit note.  Take 1 tablet today and tomorrow then continue to take 1/2 tablet daily except 1 tablet on Monday and Friday.  Re-check in 4 weeks.

## 2018-07-18 ENCOUNTER — Ambulatory Visit (INDEPENDENT_AMBULATORY_CARE_PROVIDER_SITE_OTHER): Payer: Medicare Other | Admitting: Internal Medicine

## 2018-07-18 ENCOUNTER — Encounter: Payer: Self-pay | Admitting: Internal Medicine

## 2018-07-18 VITALS — BP 130/70 | HR 84 | Temp 97.9°F | Wt 181.2 lb

## 2018-07-18 DIAGNOSIS — Z7901 Long term (current) use of anticoagulants: Secondary | ICD-10-CM | POA: Diagnosis not present

## 2018-07-18 DIAGNOSIS — I10 Essential (primary) hypertension: Secondary | ICD-10-CM

## 2018-07-18 DIAGNOSIS — I359 Nonrheumatic aortic valve disorder, unspecified: Secondary | ICD-10-CM | POA: Diagnosis not present

## 2018-07-18 DIAGNOSIS — Z7952 Long term (current) use of systemic steroids: Secondary | ICD-10-CM

## 2018-07-18 DIAGNOSIS — Z13820 Encounter for screening for osteoporosis: Secondary | ICD-10-CM

## 2018-07-18 DIAGNOSIS — I480 Paroxysmal atrial fibrillation: Secondary | ICD-10-CM

## 2018-07-18 DIAGNOSIS — M48062 Spinal stenosis, lumbar region with neurogenic claudication: Secondary | ICD-10-CM | POA: Diagnosis not present

## 2018-07-18 MED ORDER — PREDNISONE 20 MG PO TABS: 20.0000 mg | ORAL_TABLET | ORAL | 2 refills | Status: DC

## 2018-07-18 NOTE — Patient Instructions (Addendum)
-  Follow-up with me in January 2020 for annual physical exam.  -We are going to work on weaning you off prednisone.  For now instead of taking 20 mg daily you will take 20 mg every other day until you see me in January.  -We will send you for a DEXA scan to screen for osteoporosis.

## 2018-07-18 NOTE — Progress Notes (Signed)
Established Patient Office Visit     CC/Reason for Visit: Establish care and follow-up on chronic medical conditions  HPI: [name] is a 82 y.o. male who is coming in today for the above mentioned reasons.  Due for annual physical exam in January 2020. Past Medical History is significant for: History of hypertension, atrial fibrillation, gout, BPH, chronic low back pain status post lumbar disc surgery x2, right shoulder pain among other issues.  He has no acute complaints today.  Was told he needed to see me before he could schedule annual physical in January.  He is requesting for refill of his prednisone.  It appears he has been taking prednisone for the past 7 to 8 years for?  Eczema.  No clear documentation as to why he has been on this for this protracted period of time.  It is prescribed to take 20 mg every other day, however he has been taking it every day.   Past Medical/Surgical History: Past Medical History:  Diagnosis Date  . Aortic stenosis   . Atrial fibrillation ([place])   . Baker cyst    Left knee  . DJD (degenerative joint disease)    knees  . Dysrhythmia   . Eczema   . Erectile dysfunction   . Gout   . HTN (hypertension)   . MVP (mitral valve prolapse)   . Shortness of breath dyspnea    with activity    Past Surgical History:  Procedure Laterality Date  .  polyps vocal cord  1994  . AORTIC VALVE REPLACEMENT  ~2004  . HERNIA REPAIR  ~1978  . HERNIA REPAIR  ~1983  . LUMBAR LAMINECTOMY/DECOMPRESSION MICRODISCECTOMY N/A 01/15/2015   Procedure: 2 LEVEL DECOMPRESSIVE LUMBAR LAMINECTOMY L3-L4,L4-L5;  Surgeon: Latanya Maudlin, MD;  Location: [facility];  Service: Orthopedics;  Laterality: N/A;  . LUMBAR LAMINECTOMY/DECOMPRESSION MICRODISCECTOMY N/A 05/01/2017   Procedure: Lumbar one-Lumbar two, Lumbar two-Lumbar three, Lumbar three-Lumbar four Laminectomy; Evacuation of hematoma;  Surgeon: Consuella Lose, MD;  Location: [facility];  Service: Neurosurgery;   Laterality: N/A;  . right total hip  ~4 years ago  . ROTATOR CUFF REPAIR Left ~2009  . TOTAL KNEE ARTHROPLASTY Left 04/14/2015   Procedure: LEFT TOTAL KNEE ARTHROPLASTY;  Surgeon: Latanya Maudlin, MD;  Location: [facility];  Service: Orthopedics;  Laterality: Left;    Social History:  reports that he quit smoking about 15 years ago. His smoking use included cigarettes. He quit after 30.00 years of use. He has never used smokeless tobacco. He reports that he drinks alcohol. He reports that he does not use drugs.  Allergies: No Known Allergies  Family History:  Family History  Problem Relation Age of Onset  . Diabetes Unknown   . Stroke Unknown      Current Outpatient Medications:  .  allopurinol (ZYLOPRIM) 300 MG tablet, TAKE ONE TABLET BY MOUTH ONCE DAILY, Disp: 90 tablet, Rfl: 5 .  amLODipine (NORVASC) 5 MG tablet, TAKE ONE TABLET BY MOUTH ONCE DAILY, Disp: 90 tablet, Rfl: 4 .  furosemide (LASIX) 40 MG tablet, Take 1 tablet (40 mg total) by mouth daily. TAKE ONE TABLET BY MOUTH ONCE DAILY, Disp: 90 tablet, Rfl: 3 .  IRON PO, Take 1 tablet by mouth daily., Disp: , Rfl:  .  predniSONE (DELTASONE) 20 MG tablet, Take 1 tablet (20 mg total) by mouth every other day., Disp: 16 tablet, Rfl: 2 .  tamsulosin (FLOMAX) 0.4 MG CAPS capsule, TAKE ONE CAPSULE BY MOUTH ONCE DAILY  AFTER BREAKFAST, Disp: 90 capsule, Rfl: 6 .  traMADol (ULTRAM) 50 MG tablet, One half tab twice a day for chronic pain, Disp: 100 tablet, Rfl: 4 .  traZODone (DESYREL) 50 MG tablet, TAKE ONE TABLET BY MOUTH AT BEDTIME AS NEEDED, Disp: 90 tablet, Rfl: 4 .  triamcinolone cream (KENALOG) 0.1 %, CREAM TO AFFECTED AREA TWICE DAILY, Disp: 80 g, Rfl: 3 .  warfarin (COUMADIN) 5 MG tablet, Take 1 tablet (5 mg total) by mouth daily. Pt takes this medication differently, Disp: 100 tablet, Rfl: 4 .  FLUAD 0.5 ML SUSY, PHARMACIST ADMINISTERED IMMUNIZATION ADMINISTERED AT TIME OF DISPENSING, Disp: , Rfl: 0  Review of Systems:    Constitutional: Denies fever, chills, diaphoresis, appetite change and fatigue.  HEENT: Denies photophobia, eye pain, redness, hearing loss, ear pain, congestion, sore throat, rhinorrhea, sneezing, mouth sores, trouble swallowing, neck pain, neck stiffness and tinnitus.   Respiratory: Denies SOB, DOE, cough, chest tightness,  and wheezing.   Cardiovascular: Denies chest pain, palpitations and leg swelling.  Gastrointestinal: Denies nausea, vomiting, abdominal pain, diarrhea, constipation, blood in stool and abdominal distention.  Genitourinary: Denies dysuria, urgency, frequency, hematuria, flank pain and difficulty urinating.  Endocrine: Denies: hot or cold intolerance, sweats, changes in hair or nails, polyuria, polydipsia. Musculoskeletal: Denies myalgias, back pain, joint swelling, arthralgias and gait problem.  Skin: Denies pallor, rash and wound.  Neurological: Denies dizziness, seizures, syncope, weakness, light-headedness, numbness and headaches.  Hematological: Denies adenopathy. Easy bruising, personal or family bleeding history  Psychiatric/Behavioral: Denies suicidal ideation, mood changes, confusion, nervousness, sleep disturbance and agitation    Physical Exam: Vitals:   07/18/18 1415  BP: 130/70  Pulse: 84  Temp: 97.9 F (36.6 C)  TempSrc: Oral  SpO2: 97%  Weight: 181 lb 3.2 oz (82.2 kg)    Body mass index is 27.96 kg/m.    Constitutional: NAD, calm, comfortable Eyes: PERRL, lids and conjunctivae normal ENMT: Mucous membranes are moist. Posterior pharynx clear of any exudate or lesions. Normal dentition.  Neck: normal, supple, no masses, no thyromegaly Respiratory: clear to auscultation bilaterally, no wheezing, no crackles. Normal respiratory effort. No accessory muscle use.  Cardiovascular: Regular rate and rhythm, no murmurs / rubs / gallops. No extremity edema. 2+ pedal pulses. No carotid bruits.  Abdomen: no tenderness, no masses palpated. No  hepatosplenomegaly. Bowel sounds positive.  Musculoskeletal: no clubbing / cyanosis. No joint deformity upper and lower extremities. Good ROM, no contractures. Normal muscle tone.  Skin: no rashes, lesions, ulcers. No induration Neurologic: CN 2-12 grossly intact. Sensation intact, DTR normal. Strength 5/5 in all 4.  Psychiatric: Normal judgment and insight. Alert and oriented x 3. Normal mood.    Impression and Plan:  Prednisone use/osteoporosis screening -It seems to me that patient has been on prednisone for the past 7 to 8 years without a clear indication as to why. -Have had a detailed discussion with patient today about the side effect profile of prednisone, I have spent a detailed amount of time reviewing his chart, I have not found any history of any rheumatological or endocrine disorder that would necessitate long-term prednisone use. -We have decided to taper him off prednisone.  As he has been on this for many years we will initiate slow taper.  He is used to taking prednisone 20 mg every day, for now he will use 20 mg every other day until he sees me again in January for his physical. -He shows me a lot of skin bruising and muscle wasting of  his hands and forearms, this could in part be due to prolonged prednisone use. -Due to prolonged high dose steroid use, will request DEXA scan for osteoporosis screening.  Essential hypertension -Well-controlled. -On Norvasc 5, Lasix 40  Aortic valve disorder -Status post aortic valve replacement. -Followed by cardiology  Paroxysmal atrial fibrillation (HCC) -Rate controlled. -Anticoagulated on Coumadin.  Long term (current) use of anticoagulants -For atrial fibrillation.  Spinal stenosis, lumbar region, with neurogenic claudication -Not causing any issues at this time.  Due for annual Medicare wellness exam in January 2020.  At that time would like a CBC with differential, CMP, TSH, lipids and vitamin D (25-hydroxy) to be  drawn     Patient Instructions  -Follow-up with me in January 2020 for annual physical exam.  -We are going to work on weaning you off prednisone.  For now instead of taking 20 mg daily you will take 20 mg every other day until you see me in January.  -We will send you for a DEXA scan to screen for osteoporosis.     Lelon Frohlich, MD [place] [facility]

## 2018-07-23 ENCOUNTER — Ambulatory Visit (INDEPENDENT_AMBULATORY_CARE_PROVIDER_SITE_OTHER)
Admission: RE | Admit: 2018-07-23 | Discharge: 2018-07-23 | Disposition: A | Discharge status: Home / Self Care | Payer: Medicare Other | Source: Ambulatory Visit | Attending: Internal Medicine | Admitting: Internal Medicine

## 2018-07-23 DIAGNOSIS — Z7952 Long term (current) use of systemic steroids: Secondary | ICD-10-CM | POA: Diagnosis not present

## 2018-07-23 DIAGNOSIS — M48062 Spinal stenosis, lumbar region with neurogenic claudication: Secondary | ICD-10-CM | POA: Diagnosis not present

## 2018-08-06 ENCOUNTER — Ambulatory Visit (INDEPENDENT_AMBULATORY_CARE_PROVIDER_SITE_OTHER): Payer: Medicare Other | Admitting: General Practice

## 2018-08-06 DIAGNOSIS — I4891 Unspecified atrial fibrillation: Secondary | ICD-10-CM

## 2018-08-06 DIAGNOSIS — Z7901 Long term (current) use of anticoagulants: Secondary | ICD-10-CM | POA: Diagnosis not present

## 2018-08-06 LAB — POCT INR: INR: 1.7 — AB (ref 2.0–3.0)

## 2018-08-06 NOTE — Patient Instructions (Signed)
Pre visit review using our clinic review tool, if applicable. No additional management support is needed unless otherwise documented below in the visit note.  Take 1 tablet today and tomorrow then change dosage and take 1/2 tablet daily except 1 tablet on Monday/Wednesday and Friday.  Re-check in 4 weeks.

## 2018-08-15 ENCOUNTER — Other Ambulatory Visit: Payer: Self-pay | Admitting: Internal Medicine

## 2018-08-15 MED ORDER — TRAMADOL HCL 50 MG PO TABS: ORAL_TABLET | ORAL | 0 refills | Status: DC

## 2018-08-15 NOTE — Telephone Encounter (Signed)
Copied from [facility] [place] [id number]. Topic: Quick Communication - Rx Refill/Question >> Aug 15, 2018  1:30 PM Reyne Dumas L wrote: Medication: traMADol (ULTRAM) 50 MG tablet  Has the patient contacted their pharmacy? Yes - pharmacy states they can't refill because Dr. Sherren Mocha is no longer practicing (Agent: If no, request that the patient contact the pharmacy for the refill.) (Agent: If yes, when and what did the pharmacy advise?)  Preferred Pharmacy (with phone number or street name):   [place], [place] - [phone] (Phone) [phone] (Fax)  Agent: Please be advised that RX refills may take up to 3 business days. We ask that you follow-up with your pharmacy.

## 2018-09-06 ENCOUNTER — Ambulatory Visit (INDEPENDENT_AMBULATORY_CARE_PROVIDER_SITE_OTHER): Payer: Medicare Other | Admitting: General Practice

## 2018-09-06 DIAGNOSIS — Z7901 Long term (current) use of anticoagulants: Secondary | ICD-10-CM

## 2018-09-06 DIAGNOSIS — I4891 Unspecified atrial fibrillation: Secondary | ICD-10-CM

## 2018-09-06 LAB — POCT INR: INR: 2.6 (ref 2.0–3.0)

## 2018-09-06 NOTE — Patient Instructions (Addendum)
Pre visit review using our clinic review tool, if applicable. No additional management support is needed unless otherwise documented below in the visit note.  Continue to take 1/2 tablet daily except 1 tablet on Monday/Wednesday and Friday.  Re-check in 4 weeks.

## 2018-09-16 DIAGNOSIS — R350 Frequency of micturition: Secondary | ICD-10-CM | POA: Diagnosis not present

## 2018-09-17 ENCOUNTER — Ambulatory Visit (INDEPENDENT_AMBULATORY_CARE_PROVIDER_SITE_OTHER): Payer: Medicare Other | Admitting: Internal Medicine

## 2018-09-17 ENCOUNTER — Encounter: Payer: Self-pay | Admitting: Internal Medicine

## 2018-09-17 VITALS — BP 110/74 | HR 78 | Temp 97.6°F | Ht 66.0 in | Wt 177.5 lb

## 2018-09-17 DIAGNOSIS — Z0000 Encounter for general adult medical examination without abnormal findings: Secondary | ICD-10-CM | POA: Diagnosis not present

## 2018-09-17 DIAGNOSIS — I10 Essential (primary) hypertension: Secondary | ICD-10-CM | POA: Diagnosis not present

## 2018-09-17 DIAGNOSIS — Z7952 Long term (current) use of systemic steroids: Secondary | ICD-10-CM

## 2018-09-17 DIAGNOSIS — I5032 Chronic diastolic (congestive) heart failure: Secondary | ICD-10-CM | POA: Diagnosis not present

## 2018-09-17 DIAGNOSIS — Z7901 Long term (current) use of anticoagulants: Secondary | ICD-10-CM

## 2018-09-17 DIAGNOSIS — I480 Paroxysmal atrial fibrillation: Secondary | ICD-10-CM

## 2018-09-17 LAB — CBC WITH DIFFERENTIAL/PLATELET
Basophils Absolute: 0 10*3/uL (ref 0.0–0.1)
Basophils Relative: 0.5 % (ref 0.0–3.0)
Eosinophils Absolute: 0.1 10*3/uL (ref 0.0–0.7)
Eosinophils Relative: 1.2 % (ref 0.0–5.0)
HCT: 42.3 % (ref 39.0–52.0)
Hemoglobin: 14.3 g/dL (ref 13.0–17.0)
Lymphocytes Relative: 10.8 % — ABNORMAL LOW (ref 12.0–46.0)
Lymphs Abs: 0.8 10*3/uL (ref 0.7–4.0)
MCHC: 33.8 g/dL (ref 30.0–36.0)
MCV: 98.3 fl (ref 78.0–100.0)
Monocytes Absolute: 0.5 10*3/uL (ref 0.1–1.0)
Monocytes Relative: 7.2 % (ref 3.0–12.0)
Neutro Abs: 5.9 10*3/uL (ref 1.4–7.7)
Neutrophils Relative %: 80.3 % — ABNORMAL HIGH (ref 43.0–77.0)
Platelets: 172 10*3/uL (ref 150.0–400.0)
RBC: 4.3 Mil/uL (ref 4.22–5.81)
RDW: 14.3 % (ref 11.5–15.5)
WBC: 7.4 10*3/uL (ref 4.0–10.5)

## 2018-09-17 LAB — COMPREHENSIVE METABOLIC PANEL
ALT: 12 U/L (ref 0–53)
AST: 18 U/L (ref 0–37)
Albumin: 4.4 g/dL (ref 3.5–5.2)
Alkaline Phosphatase: 63 U/L (ref 39–117)
BUN: 16 mg/dL (ref 6–23)
CO2: 34 mEq/L — ABNORMAL HIGH (ref 19–32)
Calcium: 9.4 mg/dL (ref 8.4–10.5)
Chloride: 100 mEq/L (ref 96–112)
Creatinine, Ser: 0.91 mg/dL (ref 0.40–1.50)
GFR: 80 mL/min (ref >60.00)
Glucose, Bld: 104 mg/dL — ABNORMAL HIGH (ref 70–99)
Potassium: 3.7 mEq/L (ref 3.5–5.1)
Sodium: 141 mEq/L (ref 135–145)
Total Bilirubin: 0.9 mg/dL (ref 0.2–1.2)
Total Protein: 7 g/dL (ref 6.0–8.3)

## 2018-09-17 LAB — LIPID PANEL
Cholesterol: 218 mg/dL — ABNORMAL HIGH (ref 0–200)
HDL: 54.3 mg/dL (ref >39.00)
LDL Cholesterol: 129 mg/dL — ABNORMAL HIGH (ref 0–99)
NonHDL: 164.18
Total CHOL/HDL Ratio: 4
Triglycerides: 176 mg/dL — ABNORMAL HIGH (ref 0.0–149.0)
VLDL: 35.2 mg/dL (ref 0.0–40.0)

## 2018-09-17 LAB — VITAMIN D 25 HYDROXY (VIT D DEFICIENCY, FRACTURES): VITD: 37.45 ng/mL (ref 30.00–100.00)

## 2018-09-17 LAB — TSH: TSH: 0.4 u[IU]/mL (ref 0.35–4.50)

## 2018-09-17 MED ORDER — PREDNISONE 20 MG PO TABS: 10.0000 mg | ORAL_TABLET | Freq: Every day | ORAL | 2 refills | Status: DC

## 2018-09-17 NOTE — Progress Notes (Signed)
Established Patient Office Visit     CC/Reason for Visit: Medicare annual wellness visit, preventive health exam  HPI: [name] is a 83 y.o. male who is coming in today for the above mentioned reasons. Past Medical History is significant for: History of hypertension, atrial fibrillation, gout, BPH, chronic low back pain status post lumbar disc surgery x2, right shoulder pain among other issues.  He has no acute complaints today. It appears he has been taking prednisone for the past 7 to 8 years for?  Eczema.  No clear documentation as to why he has been on this for this protracted period of time.  He states physically active, he walks 3 miles a day, is independent in his ADLs.  Has no hearing issues.  He is up-to-date on immunizations.  Subsequent Medicare wellness visit   1. Risk factors, based on past  M,S,F -cardiovascular disease risk factors include a history of hypertension, hyperlipidemia.   2.  Physical activities: Walks 3 miles a day   3.  Depression/mood:  Mood is stable, not depressed or agitated   4.  Hearing:  No issues   5.  ADL's: Independent   6.  Fall risk:  Low    7.  Home safety: No problems identified   8.  Height weight, and visual acuity: Height and weight as documented in chart, has annual eye visits to document visual acuity   9.  Counseling:  Advised to continue to follow-up with other providers, medication compliance, continued physical activity as well as low-fat, low-sodium diet   10. Lab orders based on risk factors: Laboratory update will be reviewed   11. Referral :  None today   12. Care plan:  Continue to work on weaning off prednisone, bone density every 2 years while on prednisone, routine follow-up with urology and cardiology recommended   13. Cognitive assessment:  No cognitive impairment   14. Screening: Patient provided with a written and personalized 5-10 year screening schedule in the AVS.   yes   15. Provider List  Update:   Cardiology, urology, ophthalmology    Past Medical/Surgical History: Past Medical History:  Diagnosis Date  . Aortic stenosis   . Atrial fibrillation ([place])   . Baker cyst    Left knee  . DJD (degenerative joint disease)    knees  . Dysrhythmia   . Eczema   . Erectile dysfunction   . Gout   . HTN (hypertension)   . MVP (mitral valve prolapse)   . Shortness of breath dyspnea    with activity    Past Surgical History:  Procedure Laterality Date  .  polyps vocal cord  1994  . AORTIC VALVE REPLACEMENT  ~2004  . HERNIA REPAIR  ~1978  . HERNIA REPAIR  ~1983  . LUMBAR LAMINECTOMY/DECOMPRESSION MICRODISCECTOMY N/A 01/15/2015   Procedure: 2 LEVEL DECOMPRESSIVE LUMBAR LAMINECTOMY L3-L4,L4-L5;  Surgeon: Latanya Maudlin, MD;  Location: [facility];  Service: Orthopedics;  Laterality: N/A;  . LUMBAR LAMINECTOMY/DECOMPRESSION MICRODISCECTOMY N/A 05/01/2017   Procedure: Lumbar one-Lumbar two, Lumbar two-Lumbar three, Lumbar three-Lumbar four Laminectomy; Evacuation of hematoma;  Surgeon: Consuella Lose, MD;  Location: [facility];  Service: Neurosurgery;  Laterality: N/A;  . right total hip  ~4 years ago  . ROTATOR CUFF REPAIR Left ~2009  . TOTAL KNEE ARTHROPLASTY Left 04/14/2015   Procedure: LEFT TOTAL KNEE ARTHROPLASTY;  Surgeon: Latanya Maudlin, MD;  Location: [facility];  Service: Orthopedics;  Laterality: Left;    Social History:  reports that he quit smoking about 16 years ago. His smoking use included cigarettes. He quit after 30.00 years of use. He has never used smokeless tobacco. He reports current alcohol use. He reports that he does not use drugs.  Allergies: No Known Allergies  Family History:  Family History  Problem Relation Age of Onset  . Diabetes Unknown   . Stroke Unknown      Current Outpatient Medications:  .  allopurinol (ZYLOPRIM) 300 MG tablet, TAKE ONE TABLET BY MOUTH ONCE DAILY, Disp: 90 tablet, Rfl: 5 .  amLODipine (NORVASC) 5 MG tablet, TAKE ONE TABLET BY  MOUTH ONCE DAILY, Disp: 90 tablet, Rfl: 4 .  furosemide (LASIX) 40 MG tablet, Take 1 tablet (40 mg total) by mouth daily. TAKE ONE TABLET BY MOUTH ONCE DAILY, Disp: 90 tablet, Rfl: 3 .  IRON PO, Take 1 tablet by mouth daily., Disp: , Rfl:  .  predniSONE (DELTASONE) 20 MG tablet, Take 0.5 tablets (10 mg total) by mouth daily with breakfast., Disp: 30 tablet, Rfl: 2 .  tamsulosin (FLOMAX) 0.4 MG CAPS capsule, TAKE ONE CAPSULE BY MOUTH ONCE DAILY AFTER BREAKFAST, Disp: 90 capsule, Rfl: 6 .  traMADol (ULTRAM) 50 MG tablet, One half tab twice a day for chronic pain, Disp: 90 tablet, Rfl: 0 .  traZODone (DESYREL) 50 MG tablet, TAKE ONE TABLET BY MOUTH AT BEDTIME AS NEEDED, Disp: 90 tablet, Rfl: 4 .  triamcinolone cream (KENALOG) 0.1 %, CREAM TO AFFECTED AREA TWICE DAILY, Disp: 80 g, Rfl: 3 .  warfarin (COUMADIN) 5 MG tablet, Take 1 tablet (5 mg total) by mouth daily. Pt takes this medication differently, Disp: 100 tablet, Rfl: 4  Review of Systems:  Constitutional: Denies fever, chills, diaphoresis, appetite change and fatigue.  HEENT: Denies photophobia, eye pain, redness, hearing loss, ear pain, congestion, sore throat, rhinorrhea, sneezing, mouth sores, trouble swallowing, neck pain, neck stiffness and tinnitus.   Respiratory: Denies SOB, DOE, cough, chest tightness,  and wheezing.   Cardiovascular: Denies chest pain, palpitations and leg swelling.  Gastrointestinal: Denies nausea, vomiting, abdominal pain, diarrhea, constipation, blood in stool and abdominal distention.  Genitourinary: Denies dysuria, urgency, frequency, hematuria, flank pain and difficulty urinating.  Endocrine: Denies: hot or cold intolerance, sweats, changes in hair or nails, polyuria, polydipsia. Musculoskeletal: Denies myalgias, back pain, joint swelling, arthralgias and gait problem.  Skin: Denies pallor, rash and wound.  Neurological: Denies dizziness, seizures, syncope, weakness, light-headedness, numbness and headaches.    Hematological: Denies adenopathy. Easy bruising, personal or family bleeding history  Psychiatric/Behavioral: Denies suicidal ideation, mood changes, confusion, nervousness, sleep disturbance and agitation    Physical Exam: Vitals:   09/17/18 0842  BP: 110/74  Pulse: 78  Temp: 97.6 F (36.4 C)  TempSrc: Oral  SpO2: 98%  Weight: 177 lb 8 oz (80.5 kg)  Height: '5\' 6"'  (1.676 m)    Body mass index is 28.65 kg/m.   Constitutional: NAD, calm, comfortable Eyes: PERRL, lids and conjunctivae normal ENMT: Mucous membranes are moist. Posterior pharynx clear of any exudate or lesions. Normal dentition. Tympanic membrane is pearly white, no erythema or bulging. Neck: normal, supple, no masses, no thyromegaly Respiratory: clear to auscultation bilaterally, no wheezing, no crackles. Normal respiratory effort. No accessory muscle use.  Cardiovascular: Irregular rate and rhythm, positive systolic ejection murmur, no rubs / gallops. No extremity edema. 2+ pedal pulses. No carotid bruits.  Abdomen: no tenderness, no masses palpated. No hepatosplenomegaly. Bowel sounds positive.  Musculoskeletal: no clubbing / cyanosis. No joint deformity  upper and lower extremities. Good ROM, no contractures. Normal muscle tone.  Skin: no rashes, lesions, ulcers. No induration Neurologic: CN 2-12 grossly intact. Sensation intact, DTR normal. Strength 5/5 in all 4.  Psychiatric: Normal judgment and insight. Alert and oriented x 3. Normal mood.    Impression and Plan:  Encounter for preventive health examination -Immunizations are up-to-date, has routine eye and dental care, follows with cardiology and urology.  On prednisone therapy  -As stated on prior visit, he has been on high dose long-term prednisone for diagnosis of eczema.  Has been taking 20 mg daily.  Since I last saw him he elected to take half a tablet daily which means he has been taking 10 mg daily for the past 6 weeks. -Will continue prednisone  at current dose until I see him again in 3 months, will slowly continue to wean him, may consider doing 10 mg alternating with 5 mg at that time. -Due to chronic prednisone use, bone density was ordered in November which was normal. -Check vitamin D levels.  Chronic diastolic CHF (congestive heart failure) ([place]), Chronic  -Compensated. -Echo in April 2019 with an ejection fraction of 55 to 60% with no wall motion abnormality and indeterminate diastolic function due to A. fib. -He is not on beta-blocker, ACE inhibitor/ARB/ARN I for unclear reasons, will need to continue these discussions with cardiology. -Not on statin, most recent LDL was 117 in January 2018, lipids will be ordered today.  Paroxysmal atrial fibrillation (HCC), Chronic  Long term (current) use of anticoagulants -Follows with the Coumadin clinic, is currently rate controlled, anticoagulated on warfarin.  Essential hypertension -Well-controlled on Norvasc.      Patient Instructions  -It was nice seeing you today!  -Blood work today. Will notify you when results are available.  -Keep taking 10 mg prednisone every day (half of the 20 mg tablet) until you see me again.  -Schedule follow up in 3 months.   Preventive Care 61 Years and Older, Male Preventive care refers to lifestyle choices and visits with your health care provider that can promote health and wellness. What does preventive care include?   A yearly physical exam. This is also called an annual well check.  Dental exams once or twice a year.  Routine eye exams. Ask your health care provider how often you should have your eyes checked.  Personal lifestyle choices, including: ? Daily care of your teeth and gums. ? Regular physical activity. ? Eating a healthy diet. ? Avoiding tobacco and drug use. ? Limiting alcohol use. ? Practicing safe sex. ? Taking low doses of aspirin every day. ? Taking vitamin and mineral supplements as recommended by your  health care provider. What happens during an annual well check? The services and screenings done by your health care provider during your annual well check will depend on your age, overall health, lifestyle risk factors, and family history of disease. Counseling Your health care provider may ask you questions about your:  Alcohol use.  Tobacco use.  Drug use.  Emotional well-being.  Home and relationship well-being.  Sexual activity.  Eating habits.  History of falls.  Memory and ability to understand (cognition).  Work and work Statistician. Screening You may have the following tests or measurements:  Height, weight, and BMI.  Blood pressure.  Lipid and cholesterol levels. These may be checked every 5 years, or more frequently if you are over 55 years old.  Skin check.  Lung cancer screening. You may have this  screening every year starting at age 60 if you have a 30-pack-year history of smoking and currently smoke or have quit within the past 15 years.  Colorectal cancer screening. All adults should have this screening starting at age 59 and continuing until age 4. You will have tests every 1-10 years, depending on your results and the type of screening test. People at increased risk should start screening at an earlier age. Screening tests may include: ? Guaiac-based fecal occult blood testing. ? Fecal immunochemical test (FIT). ? Stool DNA test. ? Virtual colonoscopy. ? Sigmoidoscopy. During this test, a flexible tube with a tiny camera (sigmoidoscope) is used to examine your rectum and lower colon. The sigmoidoscope is inserted through your anus into your rectum and lower colon. ? Colonoscopy. During this test, a long, thin, flexible tube with a tiny camera (colonoscope) is used to examine your entire colon and rectum.  Prostate cancer screening. Recommendations will vary depending on your family history and other risks.  Hepatitis C blood test.  Hepatitis B  blood test.  Sexually transmitted disease (STD) testing.  Diabetes screening. This is done by checking your blood sugar (glucose) after you have not eaten for a while (fasting). You may have this done every 1-3 years.  Abdominal aortic aneurysm (AAA) screening. You may need this if you are a current or former smoker.  Osteoporosis. You may be screened starting at age 87 if you are at high risk. Talk with your health care provider about your test results, treatment options, and if necessary, the need for more tests. Vaccines Your health care provider may recommend certain vaccines, such as:  Influenza vaccine. This is recommended every year.  Tetanus, diphtheria, and acellular pertussis (Tdap, Td) vaccine. You may need a Td booster every 10 years.  Varicella vaccine. You may need this if you have not been vaccinated.  Zoster vaccine. You may need this after age 30.  Measles, mumps, and rubella (MMR) vaccine. You may need at least one dose of MMR if you were born in 1957 or later. You may also need a second dose.  Pneumococcal 13-valent conjugate (PCV13) vaccine. One dose is recommended after age 87.  Pneumococcal polysaccharide (PPSV23) vaccine. One dose is recommended after age 28.  Meningococcal vaccine. You may need this if you have certain conditions.  Hepatitis A vaccine. You may need this if you have certain conditions or if you travel or work in places where you may be exposed to hepatitis A.  Hepatitis B vaccine. You may need this if you have certain conditions or if you travel or work in places where you may be exposed to hepatitis B.  Haemophilus influenzae type b (Hib) vaccine. You may need this if you have certain risk factors. Talk to your health care provider about which screenings and vaccines you need and how often you need them. This information is not intended to replace advice given to you by your health care provider. Make sure you discuss any questions you have  with your health care provider. Document Released: 09/10/2015 Document Revised: 10/04/2017 Document Reviewed: 06/15/2015 Elsevier Interactive Patient Education  2019 [place], [place] [facility]

## 2018-09-17 NOTE — Patient Instructions (Signed)
-It was nice seeing you today!  -Blood work today. Will notify you when results are available.  -Keep taking 10 mg prednisone every day (half of the 20 mg tablet) until you see me again.  -Schedule follow up in 3 months.   Preventive Care 81 Years and Older, Male Preventive care refers to lifestyle choices and visits with your health care provider that can promote health and wellness. What does preventive care include?   A yearly physical exam. This is also called an annual well check.  Dental exams once or twice a year.  Routine eye exams. Ask your health care provider how often you should have your eyes checked.  Personal lifestyle choices, including: ? Daily care of your teeth and gums. ? Regular physical activity. ? Eating a healthy diet. ? Avoiding tobacco and drug use. ? Limiting alcohol use. ? Practicing safe sex. ? Taking low doses of aspirin every day. ? Taking vitamin and mineral supplements as recommended by your health care provider. What happens during an annual well check? The services and screenings done by your health care provider during your annual well check will depend on your age, overall health, lifestyle risk factors, and family history of disease. Counseling Your health care provider may ask you questions about your:  Alcohol use.  Tobacco use.  Drug use.  Emotional well-being.  Home and relationship well-being.  Sexual activity.  Eating habits.  History of falls.  Memory and ability to understand (cognition).  Work and work environment. Screening You may have the following tests or measurements:  Height, weight, and BMI.  Blood pressure.  Lipid and cholesterol levels. These may be checked every 5 years, or more frequently if you are over 50 years old.  Skin check.  Lung cancer screening. You may have this screening every year starting at age 55 if you have a 30-pack-year history of smoking and currently smoke or have quit within  the past 15 years.  Colorectal cancer screening. All adults should have this screening starting at age 50 and continuing until age 75. You will have tests every 1-10 years, depending on your results and the type of screening test. People at increased risk should start screening at an earlier age. Screening tests may include: ? Guaiac-based fecal occult blood testing. ? Fecal immunochemical test (FIT). ? Stool DNA test. ? Virtual colonoscopy. ? Sigmoidoscopy. During this test, a flexible tube with a tiny camera (sigmoidoscope) is used to examine your rectum and lower colon. The sigmoidoscope is inserted through your anus into your rectum and lower colon. ? Colonoscopy. During this test, a long, thin, flexible tube with a tiny camera (colonoscope) is used to examine your entire colon and rectum.  Prostate cancer screening. Recommendations will vary depending on your family history and other risks.  Hepatitis C blood test.  Hepatitis B blood test.  Sexually transmitted disease (STD) testing.  Diabetes screening. This is done by checking your blood sugar (glucose) after you have not eaten for a while (fasting). You may have this done every 1-3 years.  Abdominal aortic aneurysm (AAA) screening. You may need this if you are a current or former smoker.  Osteoporosis. You may be screened starting at age 81 if you are at high risk. Talk with your health care provider about your test results, treatment options, and if necessary, the need for more tests. Vaccines Your health care provider may recommend certain vaccines, such as:  Influenza vaccine. This is recommended every year.  Tetanus, diphtheria,   and acellular pertussis (Tdap, Td) vaccine. You may need a Td booster every 10 years.  Varicella vaccine. You may need this if you have not been vaccinated.  Zoster vaccine. You may need this after age 60.  Measles, mumps, and rubella (MMR) vaccine. You may need at least one dose of MMR if you  were born in 1957 or later. You may also need a second dose.  Pneumococcal 13-valent conjugate (PCV13) vaccine. One dose is recommended after age 81.  Pneumococcal polysaccharide (PPSV23) vaccine. One dose is recommended after age 81.  Meningococcal vaccine. You may need this if you have certain conditions.  Hepatitis A vaccine. You may need this if you have certain conditions or if you travel or work in places where you may be exposed to hepatitis A.  Hepatitis B vaccine. You may need this if you have certain conditions or if you travel or work in places where you may be exposed to hepatitis B.  Haemophilus influenzae type b (Hib) vaccine. You may need this if you have certain risk factors. Talk to your health care provider about which screenings and vaccines you need and how often you need them. This information is not intended to replace advice given to you by your health care provider. Make sure you discuss any questions you have with your health care provider. Document Released: 09/10/2015 Document Revised: 10/04/2017 Document Reviewed: 06/15/2015 Elsevier Interactive Patient Education  2019 Elsevier Inc.  

## 2018-09-18 ENCOUNTER — Telehealth: Payer: Self-pay | Admitting: *Deleted

## 2018-09-18 NOTE — Telephone Encounter (Signed)
Left message on machine for patient to return our call with lab results

## 2018-09-20 ENCOUNTER — Telehealth: Payer: Self-pay | Admitting: Internal Medicine

## 2018-09-20 NOTE — Telephone Encounter (Signed)
Copied from [place] [id number]. Topic: Quick Communication - Lab Results (Clinic Use ONLY) >> Sep 18, 2018  8:40 AM Lamarr Lulas, CMA wrote: Called patient to inform them of  lab results. When patient returns call, triage nurse may disclose results..  Pt calling for labs. Please call back

## 2018-09-20 NOTE — Telephone Encounter (Signed)
Pt given lab results and documented in result note.

## 2018-09-30 ENCOUNTER — Other Ambulatory Visit: Payer: Self-pay | Admitting: General Practice

## 2018-09-30 MED ORDER — WARFARIN SODIUM 5 MG PO TABS: 5.0000 mg | ORAL_TABLET | Freq: Every day | ORAL | 3 refills | Status: DC

## 2018-10-04 ENCOUNTER — Ambulatory Visit (INDEPENDENT_AMBULATORY_CARE_PROVIDER_SITE_OTHER): Payer: Medicare Other | Admitting: General Practice

## 2018-10-04 DIAGNOSIS — I4891 Unspecified atrial fibrillation: Secondary | ICD-10-CM

## 2018-10-04 DIAGNOSIS — Z7901 Long term (current) use of anticoagulants: Secondary | ICD-10-CM | POA: Diagnosis not present

## 2018-10-04 LAB — POCT INR: INR: 2.6 (ref 2.0–3.0)

## 2018-10-04 NOTE — Patient Instructions (Addendum)
Pre visit review using our clinic review tool, if applicable. No additional management support is needed unless otherwise documented below in the visit note.  Continue to take 1/2 tablet daily except 1 tablet on Monday/Wednesday and Friday.  Re-check in 6 weeks.

## 2018-11-05 ENCOUNTER — Other Ambulatory Visit: Payer: Self-pay | Admitting: Internal Medicine

## 2018-11-06 ENCOUNTER — Other Ambulatory Visit: Payer: Self-pay | Admitting: Internal Medicine

## 2018-11-06 DIAGNOSIS — Z7952 Long term (current) use of systemic steroids: Secondary | ICD-10-CM

## 2018-11-06 NOTE — Telephone Encounter (Signed)
Copied from [place] [id number]. Topic: Quick Communication - Rx Refill/Question >> Nov 06, 2018 11:10 AM Reyne Dumas L wrote: Medication: predniSONE (DELTASONE) 20 MG tablet  Has the patient contacted their pharmacy? Yes - states he needs a refill (Agent: If no, request that the patient contact the pharmacy for the refill.) (Agent: If yes, when and what did the pharmacy advise?)  Preferred Pharmacy (with phone number or street name): [place], [place] - [place] [phone] (Phone) [phone] (Fax)  Agent: Please be advised that RX refills may take up to 3 business days. We ask that you follow-up with your pharmacy.

## 2018-11-12 ENCOUNTER — Other Ambulatory Visit: Payer: Self-pay | Admitting: *Deleted

## 2018-11-12 MED ORDER — TRIAMCINOLONE ACETONIDE 0.1 % EX CREA: TOPICAL_CREAM | CUTANEOUS | 3 refills | Status: DC

## 2018-11-15 ENCOUNTER — Ambulatory Visit (INDEPENDENT_AMBULATORY_CARE_PROVIDER_SITE_OTHER): Payer: Medicare Other | Admitting: General Practice

## 2018-11-15 ENCOUNTER — Other Ambulatory Visit: Payer: Self-pay

## 2018-11-15 DIAGNOSIS — Z7901 Long term (current) use of anticoagulants: Secondary | ICD-10-CM | POA: Diagnosis not present

## 2018-11-15 DIAGNOSIS — I4891 Unspecified atrial fibrillation: Secondary | ICD-10-CM

## 2018-11-15 LAB — POCT INR: INR: 2.3 (ref 2.0–3.0)

## 2018-11-15 NOTE — Patient Instructions (Addendum)
Pre visit review using our clinic review tool, if applicable. No additional management support is needed unless otherwise documented below in the visit note.  Continue to take 1/2 tablet daily except 1 tablet on Monday/Wednesday and Friday.  Re-check in 6 weeks.

## 2018-11-26 ENCOUNTER — Telehealth: Payer: Self-pay | Admitting: *Deleted

## 2018-11-26 NOTE — Telephone Encounter (Signed)
Left message for pt to call to discuss 12-09-2018 appointment.

## 2018-11-26 NOTE — Telephone Encounter (Signed)
   Cardiac Questionnaire:    Since your last visit or hospitalization:    1. Have you been having new or worsening chest pain? no   2. Have you been having new or worsening shortness of breath? no 3. Have you been having new or worsening leg swelling, wt gain, or increase in abdominal girth (pants fitting more tightly)? no   4. Have you had any passing out spells? no    *A YES to any of these questions would result in the appointment being kept. *If all the answers to these questions are NO, we should indicate that given the current situation regarding the worldwide coronarvirus pandemic, at the recommendation of the CDC, we are looking to limit gatherings in our waiting area, and thus will reschedule their appointment beyond four weeks from today.   _____________   Follow up scheduled in july

## 2018-11-26 NOTE — Telephone Encounter (Signed)
Pt calling back returning [name]'s call

## 2018-12-02 ENCOUNTER — Telehealth: Payer: Self-pay | Admitting: *Deleted

## 2018-12-02 ENCOUNTER — Other Ambulatory Visit: Payer: Self-pay | Admitting: General Practice

## 2018-12-02 DIAGNOSIS — Z7901 Long term (current) use of anticoagulants: Secondary | ICD-10-CM

## 2018-12-02 MED ORDER — WARFARIN SODIUM 5 MG PO TABS: 5.0000 mg | ORAL_TABLET | Freq: Every day | ORAL | 1 refills | Status: DC

## 2018-12-02 NOTE — Telephone Encounter (Signed)
Appointment made for 02/2019 per patient request.

## 2018-12-02 NOTE — Telephone Encounter (Signed)
Copied from [place] [phone]. Topic: Appointment Scheduling - Scheduling Inquiry for Clinic >> Nov 29, 2018 10:45 AM [name] wrote: Reason for CRM:   Pt concerned about appointment on 04/28 and would like to speak with someone to know what his options are for visit. >> Dec 02, 2018  1:06 PM [name] wrote: Pt calling again to find out what will happen with his appointment at the end of April.  Clinic RN called patient. Informed patient of Doxy[employer] and WebEx. Patient reports he does not have a smart phone and does not have an email address. Patient verbalized he would like to cancel his appointment until a later date.

## 2018-12-09 ENCOUNTER — Ambulatory Visit: Payer: Medicare Other | Admitting: Cardiology

## 2018-12-11 ENCOUNTER — Other Ambulatory Visit: Payer: Self-pay | Admitting: Cardiology

## 2018-12-11 DIAGNOSIS — R609 Edema, unspecified: Secondary | ICD-10-CM

## 2018-12-11 NOTE — Telephone Encounter (Signed)
Furosemide  40 mg refilled 

## 2018-12-13 ENCOUNTER — Telehealth: Payer: Self-pay | Admitting: Internal Medicine

## 2018-12-13 NOTE — Telephone Encounter (Signed)
°  Relation to pt: self  Call back [phone] Pharmacy: [facility], [place]  [place] [address]  Phone: [phone] Fax: [phone]  Not a 24 hour pharmacy; exact hours not known.      Reason for call:  Patient states pharmacy requested medication 3 days ago and will run out tomorrow, patient concerned due to medication being for his BP, patient would like to hear from nurse today, please advise

## 2018-12-13 NOTE — Telephone Encounter (Signed)
Not [place] office

## 2018-12-13 NOTE — Telephone Encounter (Signed)
Copied from [place] [id number]. Topic: Quick Communication - Rx Refill/Question >> Dec 13, 2018 11:28 AM Percell Belt A wrote: Medication:  allopurinol (ZYLOPRIM) 300 MG tablet [[id number]] amLODipine (NORVASC) 5 MG tablet [[id number]]  tamsulosin (FLOMAX) 0.4 MG CAPS capsule [[id number]]    Has the patient contacted their pharmacy? Yes.   (Agent: If no, request that the patient contact the pharmacy for the refill.) (Agent: If yes, when and what did the pharmacy advise?)  Preferred Pharmacy (with phone number or street name): [place], [place] - [place] [phone] (Phone)   Agent: Please be advised that RX refills may take up to 3 business days. We ask that you follow-up with your pharmacy.

## 2018-12-16 MED ORDER — ALLOPURINOL 300 MG PO TABS: 300.0000 mg | ORAL_TABLET | Freq: Every day | ORAL | 1 refills | Status: DC

## 2018-12-16 MED ORDER — TAMSULOSIN HCL 0.4 MG PO CAPS: ORAL_CAPSULE | ORAL | 1 refills | Status: DC

## 2018-12-16 MED ORDER — AMLODIPINE BESYLATE 5 MG PO TABS: 5.0000 mg | ORAL_TABLET | Freq: Every day | ORAL | 1 refills | Status: DC

## 2018-12-16 NOTE — Telephone Encounter (Signed)
Refills sent and patient is aware

## 2018-12-24 ENCOUNTER — Ambulatory Visit: Payer: Medicare Other | Admitting: Internal Medicine

## 2018-12-27 ENCOUNTER — Ambulatory Visit (INDEPENDENT_AMBULATORY_CARE_PROVIDER_SITE_OTHER): Payer: Medicare Other | Admitting: General Practice

## 2018-12-27 DIAGNOSIS — Z7901 Long term (current) use of anticoagulants: Secondary | ICD-10-CM

## 2018-12-27 DIAGNOSIS — I4891 Unspecified atrial fibrillation: Secondary | ICD-10-CM

## 2018-12-27 LAB — POCT INR: INR: 2.2 (ref 2.0–3.0)

## 2018-12-27 NOTE — Patient Instructions (Addendum)
Pre visit review using our clinic review tool, if applicable. No additional management support is needed unless otherwise documented below in the visit note.  Continue to take 1/2 tablet daily except 1 tablet on Monday/Wednesday and Friday.  Re-check in 6 weeks.

## 2019-01-27 ENCOUNTER — Other Ambulatory Visit: Payer: Self-pay | Admitting: Internal Medicine

## 2019-02-07 ENCOUNTER — Ambulatory Visit (INDEPENDENT_AMBULATORY_CARE_PROVIDER_SITE_OTHER): Payer: Medicare Other | Admitting: General Practice

## 2019-02-07 ENCOUNTER — Other Ambulatory Visit: Payer: Self-pay

## 2019-02-07 DIAGNOSIS — Z7901 Long term (current) use of anticoagulants: Secondary | ICD-10-CM

## 2019-02-07 DIAGNOSIS — I4891 Unspecified atrial fibrillation: Secondary | ICD-10-CM

## 2019-02-07 LAB — POCT INR: INR: 2.3 (ref 2.0–3.0)

## 2019-02-07 NOTE — Patient Instructions (Addendum)
Pre visit review using our clinic review tool, if applicable. No additional management support is needed unless otherwise documented below in the visit note.  Continue to take 1/2 tablet daily except 1 tablet on Monday/Wednesday and Friday.  Re-check in 6 weeks.

## 2019-02-27 ENCOUNTER — Telehealth: Payer: Self-pay

## 2019-02-27 NOTE — Telephone Encounter (Signed)
Left a detailed message for the patient about switching his upcoming appointment to a virtual video or telephone visit. Will try calling again.

## 2019-03-04 ENCOUNTER — Telehealth: Payer: Self-pay

## 2019-03-04 NOTE — Telephone Encounter (Signed)

## 2019-03-05 NOTE — Progress Notes (Signed)
Virtual Visit via Video Note changed to phone visit at patient request.   This visit type was conducted due to national recommendations for restrictions regarding the COVID-19 Pandemic (e.g. social distancing) in an effort to limit this patient's exposure and mitigate transmission in our community.  Due to his co-morbid illnesses, this patient is at least at moderate risk for complications without adequate follow up.  This format is felt to be most appropriate for this patient at this time.  All issues noted in this document were discussed and addressed.  A limited physical exam was performed with this format.  Please refer to the patient's chart for his consent to telehealth for [facility].   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 Preoperative cardiac catheterization revealed normal coronary arteries. He has permanent atrial fibrillation. Previous carotid Dopplers due to bruits revealed 0-39% bilateral stenosis. Admitted9/18with back pain for planned laminectomy. However he was found to have spontaneous epidural hematoma which was evacuated. Discussed with Dr Kathyrn Sheriff and he felt anticoagulation could be carefully resumed.Apixaban resumed at previous ov.  Last echocardiogram April 2019 showed normal LV function, bioprosthetic aortic valve with mean gradient 13 mmHg, moderate to severe left atrial enlargement, moderate to severe right atrial enlargement, mild right ventricular enlargement.  Monitor April 2019 showed atrial fibrillation multiple pauses greater than 3 seconds all occurring late p.m. or early  a.m. hours.  Lasix increased at last office visit because of dyspnea and elevated BNP.  We asked patient to be seen by electrophysiology following results of last event monitor for consideration of pacemaker.  Patient declined.  Since I last sawhim,the patient has dyspnea with more extreme activities but not with routine activities. It is relieved with rest. It is not associated with chest pain. There is no orthopnea, PND or pedal edema. There is no syncope or palpitations. There is no exertional chest pain.   The patient does not have symptoms concerning for COVID-19 infection (fever, chills, cough, or new shortness of breath).    Past Medical History:  Diagnosis Date   Aortic stenosis    Atrial fibrillation (HCC)    Baker cyst    Left knee   DJD (degenerative joint disease)    knees   Dysrhythmia    Eczema    Erectile dysfunction    Gout    HTN (hypertension)    MVP (mitral valve prolapse)    Shortness of breath dyspnea    with activity   Past Surgical History:  Procedure Laterality Date    polyps vocal cord  1994   AORTIC VALVE REPLACEMENT  ~2004   HERNIA REPAIR  ~1978   HERNIA REPAIR  ~1983   LUMBAR LAMINECTOMY/DECOMPRESSION MICRODISCECTOMY N/A 01/15/2015   Procedure: 2 LEVEL DECOMPRESSIVE LUMBAR LAMINECTOMY L3-L4,L4-L5;  Surgeon: Latanya Maudlin, MD;  Location: [facility];  Service: Orthopedics;  Laterality: N/A;   LUMBAR LAMINECTOMY/DECOMPRESSION MICRODISCECTOMY N/A 05/01/2017   Procedure: Lumbar one-Lumbar two, Lumbar two-Lumbar three, Lumbar three-Lumbar four Laminectomy; Evacuation of hematoma;  Surgeon: Consuella Lose, MD;  Location: [facility];  Service: Neurosurgery;  Laterality: N/A;   right total hip  ~4  years ago   [place] Left ~2009   TOTAL KNEE ARTHROPLASTY Left 04/14/2015   Procedure: LEFT TOTAL KNEE ARTHROPLASTY;  Surgeon: Latanya Maudlin, MD;  Location: [facility];  Service: Orthopedics;  Laterality: Left;     Current Meds  Medication Sig    allopurinol (ZYLOPRIM) 300 MG tablet Take 1 tablet (300 mg total) by mouth daily.   amLODipine (NORVASC) 5 MG tablet Take 1 tablet (5 mg total) by mouth daily.   furosemide (LASIX) 40 MG tablet Take 1 tablet by mouth once daily   IRON PO Take 1 tablet by mouth daily.   predniSONE (DELTASONE) 20 MG tablet Take 0.5 tablets (10 mg total) by mouth daily with breakfast.   tamsulosin (FLOMAX) 0.4 MG CAPS capsule TAKE ONE CAPSULE BY MOUTH ONCE DAILY AFTER BREAKFAST   traMADol (ULTRAM) 50 MG tablet TAKE 1/2 (ONE-HALF) TABLET BY MOUTH TWICE DAILY FOR  CHRONIC  PAIN   traZODone (DESYREL) 50 MG tablet TAKE ONE TABLET BY MOUTH AT BEDTIME AS NEEDED   triamcinolone cream (KENALOG) 0.1 % CREAM TO AFFECTED AREA TWICE DAILY   warfarin (COUMADIN) 5 MG tablet Take 1 tablet (5 mg total) by mouth daily. Take 1/2 tablet daily except 1 tablet on Mon Wed and Fri or Take as directed by anticoagulation clinic  90 day     Allergies:   Patient has no known allergies.   Social History   Tobacco Use   Smoking status: Former Smoker    Years: 30.00    Types: Cigarettes    Quit date: 08/28/2002    Years since quitting: 16.5   Smokeless tobacco: Never Used  Substance Use Topics   Alcohol use: Yes    Comment: social   Drug use: No     Family Hx: The patient's family history includes Diabetes in his unknown relative; Stroke in his unknown relative.  ROS:   Please see the history of present illness.    No Fever, chills  or productive cough All other systems reviewed and are negative.  Recent Labs: 09/17/2018: ALT 12; BUN 16; Creatinine, Ser 0.91; Hemoglobin 14.3; Platelets 172.0; Potassium 3.7; Sodium 141; TSH 0.40   Recent Lipid Panel Lab Results  Component Value Date/Time   CHOL 218 (H) 09/17/2018 09:24 AM   TRIG 176.0 (H) 09/17/2018 09:24 AM   HDL 54.30 09/17/2018 09:24 AM   CHOLHDL 4 09/17/2018 09:24 AM   LDLCALC 129 (H) 09/17/2018 09:24 AM    Wt Readings from Last 3 Encounters:    03/07/19 176 lb (79.8 kg)  09/17/18 177 lb 8 oz (80.5 kg)  07/18/18 181 lb 3.2 oz (82.2 kg)     Objective:    Vital Signs:  BP 140/70    Pulse 70    Ht 5\' 7"  (1.702 m)    Wt 176 lb (79.8 kg)    BMI 27.57 kg/m    VITAL SIGNS:  reviewed NAD Answers questions appropriately Normal affect Remainder of physical examination not performed (telehealth visit; coronavirus pandemic)  ASSESSMENT & PLAN:    1. Permanent atrial fibrillation-patient's heart rate remains controlled on no medications.  Continue Coumadin. 2. Hypertension-patient's blood pressure is controlled.  Continue present medications and follow. 3. History of aortic valve replacement-continue SBE prophylaxis. 4. Chronic diastolic congestive heart failure-plan to continue present dose of diuretic.  Check potassium and renal function.  Continue fluid restriction and low-sodium diet. 5. History of bradycardia-previously noted to have pauses greater than 3 seconds but occurred typically in early a.m. hours.  No syncope.  Continue to follow.  COVID-19 Education: The importance of social distancing was discussed today.  Time:   Today, I have spent 18 minutes with the patient with telehealth technology discussing the above problems.     Medication Adjustments/Labs and Tests Ordered: Current medicines are reviewed at length with the patient today.  Concerns regarding medicines are outlined above.   Tests Ordered: No orders of the defined types were placed in this encounter.   Medication Changes: No orders of the defined types were placed in this encounter.   Follow Up:  Virtual Visit or In Person in 6 month(s)  Signed, Kirk Ruths, MD  03/07/2019 9:52 AM    [place]

## 2019-03-06 ENCOUNTER — Telehealth: Payer: Self-pay | Admitting: Cardiology

## 2019-03-06 ENCOUNTER — Other Ambulatory Visit: Payer: Self-pay | Admitting: Cardiology

## 2019-03-06 DIAGNOSIS — R609 Edema, unspecified: Secondary | ICD-10-CM

## 2019-03-06 NOTE — Telephone Encounter (Signed)
I called pt to confirm appt for 03-07-19 with Dr Stanford Breed.-

## 2019-03-07 ENCOUNTER — Encounter: Payer: Self-pay | Admitting: Cardiology

## 2019-03-07 ENCOUNTER — Other Ambulatory Visit: Payer: Self-pay | Admitting: *Deleted

## 2019-03-07 ENCOUNTER — Telehealth (INDEPENDENT_AMBULATORY_CARE_PROVIDER_SITE_OTHER): Payer: Medicare Other | Admitting: Cardiology

## 2019-03-07 VITALS — BP 140/70 | HR 70 | Ht 67.0 in | Wt 176.0 lb

## 2019-03-07 DIAGNOSIS — I4821 Permanent atrial fibrillation: Secondary | ICD-10-CM

## 2019-03-07 DIAGNOSIS — Z7952 Long term (current) use of systemic steroids: Secondary | ICD-10-CM

## 2019-03-07 DIAGNOSIS — Z952 Presence of prosthetic heart valve: Secondary | ICD-10-CM | POA: Diagnosis not present

## 2019-03-07 DIAGNOSIS — R609 Edema, unspecified: Secondary | ICD-10-CM | POA: Diagnosis not present

## 2019-03-07 DIAGNOSIS — I5032 Chronic diastolic (congestive) heart failure: Secondary | ICD-10-CM

## 2019-03-07 MED ORDER — FUROSEMIDE 40 MG PO TABS: 40.0000 mg | ORAL_TABLET | Freq: Every day | ORAL | 0 refills | Status: DC

## 2019-03-07 NOTE — Patient Instructions (Signed)
Medication Instructions:  Refill sent to the pharmacy electronically.  If you need a refill on your cardiac medications before your next appointment, please call your pharmacy.   Lab work: Your physician recommends that you return for lab work [place] If you have labs (blood work) drawn today and your tests are completely normal, you will receive your results only by: Marland Kitchen MyChart Message (if you have MyChart) OR . A paper copy in the mail If you have any lab test that is abnormal or we need to change your treatment, we will call you to review the results.  Follow-Up: At [facility], you and your health needs are our priority.  As part of our continuing mission to provide you with exceptional heart care, we have created designated Provider Care Teams.  These Care Teams include your primary Cardiologist (physician) and Advanced Practice Providers (APPs -  Physician Assistants and Nurse Practitioners) who all work together to provide you with the care you need, when you need it. You will need a follow up appointment in 6 months.  Please call our office 2 months in advance to schedule this appointment.  You may see Kirk Ruths MD or one of the following Advanced Practice Providers on your designated Care Team:   Kerin Ransom, PA-C Roby Lofts, [place] . Sande Rives, PA-C

## 2019-03-07 NOTE — Telephone Encounter (Signed)
Pt calling to request refill for Trazodone 50 mg tab and prednisone 20 mg tablet.

## 2019-03-11 ENCOUNTER — Ambulatory Visit: Payer: Self-pay | Admitting: Internal Medicine

## 2019-03-11 MED ORDER — TRAZODONE HCL 50 MG PO TABS: 50.0000 mg | ORAL_TABLET | Freq: Every evening | ORAL | 4 refills | Status: DC | PRN

## 2019-03-11 MED ORDER — PREDNISONE 20 MG PO TABS: 10.0000 mg | ORAL_TABLET | Freq: Every day | ORAL | 0 refills | Status: DC

## 2019-03-11 MED ORDER — TAMSULOSIN HCL 0.4 MG PO CAPS: ORAL_CAPSULE | ORAL | 1 refills | Status: DC

## 2019-03-11 NOTE — Addendum Note (Signed)
Addended by: Westley Hummer B on: 03/11/2019 07:37 AM   Modules accepted: Orders

## 2019-03-11 NOTE — Addendum Note (Signed)
Addended by: Lelon Frohlich Y on: 03/11/2019 08:04 AM   Modules accepted: Orders

## 2019-03-11 NOTE — Addendum Note (Signed)
Addended by: Westley Hummer B on: 03/11/2019 10:27 AM   Modules accepted: Orders

## 2019-03-11 NOTE — Telephone Encounter (Signed)
Pt is calling checking on prednisone refill

## 2019-03-11 NOTE — Telephone Encounter (Signed)
Patient is taking half tab daily only.  Okay to refill?

## 2019-03-21 ENCOUNTER — Other Ambulatory Visit (HOSPITAL_COMMUNITY)
Admission: RE | Admit: 2019-03-21 | Discharge: 2019-03-21 | Disposition: A | Discharge status: Home / Self Care | Payer: Medicare Other | Source: Ambulatory Visit | Attending: Cardiology | Admitting: Cardiology

## 2019-03-21 ENCOUNTER — Encounter: Payer: Self-pay | Admitting: *Deleted

## 2019-03-21 ENCOUNTER — Other Ambulatory Visit: Payer: Self-pay

## 2019-03-21 ENCOUNTER — Other Ambulatory Visit: Payer: Medicare Other

## 2019-03-21 ENCOUNTER — Ambulatory Visit (INDEPENDENT_AMBULATORY_CARE_PROVIDER_SITE_OTHER): Payer: Medicare Other | Admitting: General Practice

## 2019-03-21 DIAGNOSIS — Z7901 Long term (current) use of anticoagulants: Secondary | ICD-10-CM

## 2019-03-21 DIAGNOSIS — R7989 Other specified abnormal findings of blood chemistry: Secondary | ICD-10-CM | POA: Diagnosis not present

## 2019-03-21 LAB — CBC
HCT: 43.1 % (ref 39.0–52.0)
Hemoglobin: 14.2 g/dL (ref 13.0–17.0)
MCH: 33.4 pg (ref 26.0–34.0)
MCHC: 32.9 g/dL (ref 30.0–36.0)
MCV: 101.4 fL — ABNORMAL HIGH (ref 80.0–100.0)
Platelets: 179 10*3/uL (ref 150–400)
RBC: 4.25 MIL/uL (ref 4.22–5.81)
RDW: 13.4 % (ref 11.5–15.5)
WBC: 7.2 10*3/uL (ref 4.0–10.5)
nRBC: 0 % (ref 0.0–0.2)

## 2019-03-21 LAB — BASIC METABOLIC PANEL
Anion gap: 10 (ref 5–15)
BUN: 17 mg/dL (ref 8–23)
CO2: 29 mmol/L (ref 22–32)
Calcium: 9.4 mg/dL (ref 8.9–10.3)
Chloride: 100 mmol/L (ref 98–111)
Creatinine, Ser: 1.01 mg/dL (ref 0.61–1.24)
GFR calc Af Amer: >60 mL/min (ref >60)
GFR calc non Af Amer: >60 mL/min (ref >60)
Glucose, Bld: 104 mg/dL — ABNORMAL HIGH (ref 70–99)
Potassium: 4.1 mmol/L (ref 3.5–5.1)
Sodium: 139 mmol/L (ref 135–145)

## 2019-03-21 LAB — POCT INR: INR: 2 (ref 2.0–3.0)

## 2019-03-21 NOTE — Patient Instructions (Addendum)
Pre visit review using our clinic review tool, if applicable. No additional management support is needed unless otherwise documented below in the visit note.  Continue to take 1/2 tablet daily except 1 tablet on Monday/Wednesday and Friday.  Re-check in 6 weeks.

## 2019-04-03 ENCOUNTER — Other Ambulatory Visit: Payer: Self-pay

## 2019-04-03 ENCOUNTER — Telehealth (INDEPENDENT_AMBULATORY_CARE_PROVIDER_SITE_OTHER): Payer: Medicare Other | Admitting: Internal Medicine

## 2019-04-03 ENCOUNTER — Telehealth: Payer: Self-pay | Admitting: Internal Medicine

## 2019-04-03 DIAGNOSIS — Z7952 Long term (current) use of systemic steroids: Secondary | ICD-10-CM

## 2019-04-03 MED ORDER — PREDNISONE 20 MG PO TABS: 10.0000 mg | ORAL_TABLET | Freq: Every day | ORAL | 1 refills | Status: DC

## 2019-04-03 NOTE — Progress Notes (Signed)
Virtual Visit via Telephone Note  I connected with [name] on 04/03/19 at  3:30 PM EDT by telephone and verified that I am speaking with the correct person using two identifiers.   I discussed the limitations, risks, security and privacy concerns of performing an evaluation and management service by telephone and the availability of in person appointments. I also discussed with the patient that there may be a patient responsible charge related to this service. The patient expressed understanding and agreed to proceed.  Location patient: home Location provider: work office Participants present for the call: patient, provider Patient did not have a visit in the prior 7 days to address this/these issue(s).   History of Present Illness:  He has scheduled this consultation mainly to discuss continued prednisone prescriptions.  When I first saw him in November of last year, I noted he was on prednisone 20 mg which he stated he had been taking for about 7 to 8 years for eczema.  He was experiencing muscle wasting in the thenar surfaces of both hands.  I have not been able to found a reason for long-term high-dose prednisone use, I was able to wean him down to 10 mg daily.  When we tried to wean him down further he did not feel well.  States he felt depressed, with no energy and wants to go back on full dose prednisone and is requesting refills today.  He had a bone density in November 2019 that did not show signs of osteoporosis.   Observations/Objective: Patient sounds cheerful and well on the phone. I do not appreciate any increased work of breathing. Speech and thought processing are grossly intact. Patient reported vitals: None reported today   Current Outpatient Medications:  .  allopurinol (ZYLOPRIM) 300 MG tablet, Take 1 tablet (300 mg total) by mouth daily., Disp: 90 tablet, Rfl: 1 .  amLODipine (NORVASC) 5 MG tablet, Take 1 tablet (5 mg total) by mouth daily., Disp: 90  tablet, Rfl: 1 .  furosemide (LASIX) 40 MG tablet, Take 1 tablet (40 mg total) by mouth daily., Disp: 90 tablet, Rfl: 0 .  IRON PO, Take 1 tablet by mouth daily., Disp: , Rfl:  .  predniSONE (DELTASONE) 20 MG tablet, Take 0.5 tablets (10 mg total) by mouth daily with breakfast., Disp: 45 tablet, Rfl: 1 .  tamsulosin (FLOMAX) 0.4 MG CAPS capsule, TAKE ONE CAPSULE BY MOUTH ONCE DAILY AFTER BREAKFAST, Disp: 90 capsule, Rfl: 1 .  traMADol (ULTRAM) 50 MG tablet, TAKE 1/2 (ONE-HALF) TABLET BY MOUTH TWICE DAILY FOR  CHRONIC  PAIN, Disp: 90 tablet, Rfl: 0 .  traZODone (DESYREL) 50 MG tablet, Take 1 tablet (50 mg total) by mouth at bedtime as needed., Disp: 90 tablet, Rfl: 4 .  triamcinolone cream (KENALOG) 0.1 %, CREAM TO AFFECTED AREA TWICE DAILY, Disp: 80 g, Rfl: 3 .  warfarin (COUMADIN) 5 MG tablet, Take 1 tablet (5 mg total) by mouth daily. Take 1/2 tablet daily except 1 tablet on Mon Wed and Fri or Take as directed by anticoagulation clinic  90 day, Disp: 75 tablet, Rfl: 1  Review of Systems:  Constitutional: Denies fever, chills, diaphoresis, appetite change and fatigue.  HEENT: Denies photophobia, eye pain, redness, hearing loss, ear pain, congestion, sore throat, rhinorrhea, sneezing, mouth sores, trouble swallowing, neck pain, neck stiffness and tinnitus.   Respiratory: Denies SOB, DOE, cough, chest tightness,  and wheezing.   Cardiovascular: Denies chest pain, palpitations and leg swelling.  Gastrointestinal: Denies nausea,  vomiting, abdominal pain, diarrhea, constipation, blood in stool and abdominal distention.  Genitourinary: Denies dysuria, urgency, frequency, hematuria, flank pain and difficulty urinating.  Endocrine: Denies: hot or cold intolerance, sweats, changes in hair or nails, polyuria, polydipsia. Musculoskeletal: Denies myalgias, back pain, joint swelling, arthralgias and gait problem.  Skin: Denies pallor, rash and wound.  Neurological: Denies dizziness, seizures, syncope,  weakness, light-headedness, numbness and headaches.  Hematological: Denies adenopathy. Easy bruising, personal or family bleeding history  Psychiatric/Behavioral: Denies suicidal ideation, mood changes, confusion, nervousness, sleep disturbance and agitation   Assessment and Plan:  On prednisone therapy -Suspect he has gotten used to prednisone at this point. -I have counseled patient extensively on the side effects of long-term prednisone use, nonetheless he would like to return to at least 10 mg daily of prednisone. -Will prescribe.   I discussed the assessment and treatment plan with the patient. The patient was provided an opportunity to ask questions and all were answered. The patient agreed with the plan and demonstrated an understanding of the instructions.   The patient was advised to call back or seek an in-person evaluation if the symptoms worsen or if the condition fails to improve as anticipated.  I provided 13 minutes of non-face-to-face time during this encounter.   Lelon Frohlich, MD [facility]

## 2019-04-03 NOTE — Telephone Encounter (Signed)
Copied from [place] [id number]. Topic: Referral - Request for Referral >> Apr 03, 2019  4:09 PM Richardo Priest, NT wrote: Has patient seen PCP for this complaint? yes *If NO, is insurance requiring patient see PCP for this issue before PCP can refer them? Referral for which specialty: Orthopedic doctor who specializes in hands Preferred provider/office: NA Reason for referral: Patient states fingers get numb and cannot handle silverware at times. Please advise.

## 2019-04-04 NOTE — Telephone Encounter (Signed)
We spoke about this during our visit yesterday.

## 2019-04-10 DIAGNOSIS — Z7901 Long term (current) use of anticoagulants: Secondary | ICD-10-CM | POA: Diagnosis not present

## 2019-04-10 DIAGNOSIS — I10 Essential (primary) hypertension: Secondary | ICD-10-CM | POA: Diagnosis not present

## 2019-04-10 DIAGNOSIS — Z1159 Encounter for screening for other viral diseases: Secondary | ICD-10-CM | POA: Diagnosis not present

## 2019-04-10 DIAGNOSIS — Z79899 Other long term (current) drug therapy: Secondary | ICD-10-CM | POA: Diagnosis not present

## 2019-04-10 DIAGNOSIS — T148XXA Other injury of unspecified body region, initial encounter: Secondary | ICD-10-CM | POA: Diagnosis not present

## 2019-04-14 DIAGNOSIS — M79642 Pain in left hand: Secondary | ICD-10-CM | POA: Diagnosis not present

## 2019-04-14 DIAGNOSIS — M79641 Pain in right hand: Secondary | ICD-10-CM | POA: Diagnosis not present

## 2019-04-14 DIAGNOSIS — T148XXA Other injury of unspecified body region, initial encounter: Secondary | ICD-10-CM | POA: Diagnosis not present

## 2019-04-19 ENCOUNTER — Other Ambulatory Visit: Payer: Self-pay | Admitting: Internal Medicine

## 2019-04-19 DIAGNOSIS — Z7952 Long term (current) use of systemic steroids: Secondary | ICD-10-CM

## 2019-04-24 ENCOUNTER — Other Ambulatory Visit: Payer: Self-pay | Admitting: Internal Medicine

## 2019-05-01 DIAGNOSIS — G5603 Carpal tunnel syndrome, bilateral upper limbs: Secondary | ICD-10-CM | POA: Diagnosis not present

## 2019-05-01 DIAGNOSIS — I10 Essential (primary) hypertension: Secondary | ICD-10-CM | POA: Diagnosis not present

## 2019-05-09 ENCOUNTER — Ambulatory Visit (INDEPENDENT_AMBULATORY_CARE_PROVIDER_SITE_OTHER): Payer: Medicare Other | Admitting: General Practice

## 2019-05-09 ENCOUNTER — Other Ambulatory Visit: Payer: Self-pay

## 2019-05-09 DIAGNOSIS — I4891 Unspecified atrial fibrillation: Secondary | ICD-10-CM

## 2019-05-09 DIAGNOSIS — Z7901 Long term (current) use of anticoagulants: Secondary | ICD-10-CM

## 2019-05-09 LAB — POCT INR: INR: 3.2 — AB (ref 2.0–3.0)

## 2019-05-09 NOTE — Patient Instructions (Addendum)
Pre visit review using our clinic review tool, if applicable. No additional management support is needed unless otherwise documented below in the visit note.  Skip coumadin today and then continue to take 1/2 tablet daily except 1 tablet on Monday/Wednesday and Friday.  Re-check in 4 weeks.

## 2019-05-28 DIAGNOSIS — H2513 Age-related nuclear cataract, bilateral: Secondary | ICD-10-CM | POA: Diagnosis not present

## 2019-05-31 ENCOUNTER — Other Ambulatory Visit: Payer: Self-pay | Admitting: Internal Medicine

## 2019-05-31 ENCOUNTER — Other Ambulatory Visit: Payer: Self-pay | Admitting: Cardiology

## 2019-05-31 DIAGNOSIS — R609 Edema, unspecified: Secondary | ICD-10-CM

## 2019-06-06 ENCOUNTER — Ambulatory Visit: Payer: Medicare Other

## 2019-06-13 ENCOUNTER — Other Ambulatory Visit: Payer: Self-pay

## 2019-06-13 ENCOUNTER — Ambulatory Visit (INDEPENDENT_AMBULATORY_CARE_PROVIDER_SITE_OTHER): Payer: Medicare Other | Admitting: General Practice

## 2019-06-13 DIAGNOSIS — I4891 Unspecified atrial fibrillation: Secondary | ICD-10-CM

## 2019-06-13 DIAGNOSIS — Z7901 Long term (current) use of anticoagulants: Secondary | ICD-10-CM

## 2019-06-13 LAB — POCT INR: INR: 2.2 (ref 2.0–3.0)

## 2019-06-13 NOTE — Patient Instructions (Addendum)
Pre visit review using our clinic review tool, if applicable. No additional management support is needed unless otherwise documented below in the visit note.  Continue to take 1/2 tablet daily except 1 tablet on Monday/Wednesday and Friday.  Re-check in 6 weeks.

## 2019-06-13 NOTE — Progress Notes (Signed)
Medical screening examination/treatment/procedure(s) were performed by non-physician practitioner and as supervising physician I was immediately available for consultation/collaboration. I agree with above. Renna Kilmer, MD   

## 2019-07-18 ENCOUNTER — Other Ambulatory Visit: Payer: Self-pay | Admitting: Internal Medicine

## 2019-08-01 ENCOUNTER — Other Ambulatory Visit: Payer: Self-pay

## 2019-08-01 ENCOUNTER — Ambulatory Visit (INDEPENDENT_AMBULATORY_CARE_PROVIDER_SITE_OTHER): Payer: Medicare Other | Admitting: General Practice

## 2019-08-01 DIAGNOSIS — Z7901 Long term (current) use of anticoagulants: Secondary | ICD-10-CM | POA: Diagnosis not present

## 2019-08-01 LAB — POCT INR: INR: 2.2 (ref 2.0–3.0)

## 2019-08-01 NOTE — Patient Instructions (Addendum)
Pre visit review using our clinic review tool, if applicable. No additional management support is needed unless otherwise documented below in the visit note.  Continue to take 1/2 tablet daily except 1 tablet on Monday/Wednesday and Friday.  Re-check in 6 weeks.

## 2019-08-01 NOTE — Progress Notes (Signed)
Medical screening examination/treatment/procedure(s) were performed by non-physician practitioner and as supervising physician I was immediately available for consultation/collaboration. I agree with above. James John, MD   

## 2019-08-28 ENCOUNTER — Other Ambulatory Visit: Payer: Self-pay | Admitting: Internal Medicine

## 2019-09-08 ENCOUNTER — Other Ambulatory Visit: Payer: Self-pay | Admitting: General Practice

## 2019-09-08 DIAGNOSIS — Z7901 Long term (current) use of anticoagulants: Secondary | ICD-10-CM

## 2019-09-08 MED ORDER — WARFARIN SODIUM 5 MG PO TABS: 5.0000 mg | ORAL_TABLET | Freq: Every day | ORAL | 1 refills | Status: DC

## 2019-09-11 NOTE — Progress Notes (Signed)
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 Preoperative cardiac catheterization revealed normal coronary arteries. He has permanent atrial fibrillation. Previous carotid Dopplers due to bruits revealed 0-39% bilateral stenosis. Admitted9/18with back pain for planned laminectomy. However he was found to have spontaneous epidural hematoma which was evacuated. Discussed with Dr Kathyrn Sheriff and he felt anticoagulation could be carefully resumed.Apixaban [facility].Last echocardiogram April 2019 showed normal LV function, bioprosthetic aortic valve with mean gradient 13 mmHg, moderate to severe left atrial enlargement, moderate to severe right atrial enlargement, mild right ventricular enlargement. Monitor April 2019 showed atrial fibrillation with multiple pauses greater than 3 seconds all occurring late p.m. or early a.m. hours. We asked patient to be seen by electrophysiology following results of previous event monitor for consideration of pacemaker. Patient declined. Since I last sawhim,he has some dyspnea on exertion but no orthopnea, PND, pedal edema, chest pain or syncope. No bleeding.  Current Outpatient Medications  Medication Sig Dispense Refill  . allopurinol (ZYLOPRIM) 300 MG tablet Take 1 tablet by mouth once daily 90 tablet 0  . amLODipine (NORVASC) 5 MG tablet Take 1 tablet by mouth once daily 90 tablet 0  . furosemide (LASIX) 40 MG tablet Take 1 tablet by mouth once daily 90 tablet 2  . IRON PO Take 1 tablet by mouth daily.    . predniSONE (DELTASONE) 20 MG tablet TAKE 0.5 TABLETS (10 MG TOTAL) BY MOUTH DAILY WITH BREAKFAST 32 tablet 0  . tamsulosin (FLOMAX) 0.4 MG CAPS capsule TAKE ONE CAPSULE BY MOUTH ONCE DAILY AFTER BREAKFAST 90 capsule 1  . traMADol (ULTRAM) 50 MG tablet TAKE 1/2 (ONE-HALF) TABLET BY MOUTH TWICE DAILY FOR  CHRONIC  PAIN 90 tablet 0  .  traZODone (DESYREL) 50 MG tablet Take 1 tablet (50 mg total) by mouth at bedtime as needed. 90 tablet 4  . triamcinolone cream (KENALOG) 0.1 % CREAM TO AFFECTED AREA TWICE DAILY 80 g 3  . warfarin (COUMADIN) 5 MG tablet Take 1 tablet (5 mg total) by mouth daily. Take 1/2 tablet daily except 1 tablet on Mon Wed and Fri or Take as directed by anticoagulation clinic  90 day 75 tablet 1   No current facility-administered medications for this visit.     Past Medical History:  Diagnosis Date  . Aortic stenosis   . Atrial fibrillation ([place])   . Baker cyst    Left knee  . DJD (degenerative joint disease)    knees  . Dysrhythmia   . Eczema   . Erectile dysfunction   . Gout   . HTN (hypertension)   . MVP (mitral valve prolapse)   . Shortness of breath dyspnea    with activity    Past Surgical History:  Procedure Laterality Date  .  polyps vocal cord  1994  . AORTIC VALVE REPLACEMENT  ~2004  . HERNIA REPAIR  ~1978  . HERNIA REPAIR  ~1983  . LUMBAR LAMINECTOMY/DECOMPRESSION MICRODISCECTOMY N/A 01/15/2015   Procedure: 2 LEVEL DECOMPRESSIVE LUMBAR LAMINECTOMY L3-L4,L4-L5;  Surgeon: Latanya Maudlin, MD;  Location: [facility];  Service: Orthopedics;  Laterality: N/A;  . LUMBAR LAMINECTOMY/DECOMPRESSION MICRODISCECTOMY N/A 05/01/2017   Procedure: Lumbar one-Lumbar two, Lumbar two-Lumbar three, Lumbar three-Lumbar four Laminectomy; Evacuation of hematoma;  Surgeon: Consuella Lose, MD;  Location: [facility];  Service: Neurosurgery;  Laterality: N/A;  . right total hip  ~4 years ago  . ROTATOR CUFF REPAIR Left ~2009  .  TOTAL KNEE ARTHROPLASTY Left 04/14/2015   Procedure: LEFT TOTAL KNEE ARTHROPLASTY;  Surgeon: Latanya Maudlin, MD;  Location: [facility];  Service: Orthopedics;  Laterality: Left;    Social History   Socioeconomic History  . Marital status: Divorced    Spouse name: Not on file  . Number of children: Not on file  . Years of education: Not on file  . Highest education level: Not on file    Occupational History  . Not on file  Tobacco Use  . Smoking status: Former Smoker    Years: 30.00    Types: Cigarettes    Quit date: 08/28/2002    Years since quitting: 17.0  . Smokeless tobacco: Never Used  Substance and Sexual Activity  . Alcohol use: Yes    Comment: social  . Drug use: No  . Sexual activity: Not on file  Other Topics Concern  . Not on file  Social History Narrative  . Not on file   Social Determinants of Health   Financial Resource Strain:   . Difficulty of Paying Living Expenses: Not on file  Food Insecurity:   . Worried About Charity fundraiser in the Last Year: Not on file  . Ran Out of Food in the Last Year: Not on file  Transportation Needs:   . Lack of Transportation (Medical): Not on file  . Lack of Transportation (Non-Medical): Not on file  Physical Activity:   . Days of Exercise per Week: Not on file  . Minutes of Exercise per Session: Not on file  Stress:   . Feeling of Stress : Not on file  Social Connections:   . Frequency of Communication with Friends and Family: Not on file  . Frequency of Social Gatherings with Friends and Family: Not on file  . Attends Religious Services: Not on file  . Active Member of Clubs or Organizations: Not on file  . Attends Archivist Meetings: Not on file  . Marital Status: Not on file  Intimate Partner Violence:   . Fear of Current or Ex-Partner: Not on file  . Emotionally Abused: Not on file  . Physically Abused: Not on file  . Sexually Abused: Not on file    Family History  Problem Relation Age of Onset  . Diabetes Other   . Stroke Other     ROS: no fevers or chills, productive cough, hemoptysis, dysphasia, odynophagia, melena, hematochezia, dysuria, hematuria, rash, seizure activity, orthopnea, PND, pedal edema, claudication. Remaining systems are negative.  Physical Exam: Well-developed well-nourished in no acute distress.  Skin is warm and dry.  HEENT is normal.  Neck is supple.   Chest is clear to auscultation with normal expansion.  Cardiovascular exam is irregular, 2/6 systolic murmur left sternal border. No diastolic murmur. Abdominal exam nontender or distended. No masses palpated. Extremities show no edema. neuro grossly intact  ECG-atrial fibrillation at a rate of 82, normal axis, RV conduction delay.  Personally reviewed  A/P  1 Permanent atrial fibrillation-HR controlled on no meds; continue coumadin. We have provided the name of Xarelto and apixaban and patient will check and see if affordable.  2 Hypertension-BP controlled; continue present medical regimen.  3 s/p AVR-continue SBE prophylaxis.  4 Chronic diastolic dysfunction-Euvolemic; continue present dose of lasix; check k and renal function.  5 H/O bradycardia-patient noted to have pauses greater than 3 seconds previously on monitor but typically occurred in the early a.m. hours.  He has not had syncope.  We will continue to  follow.  Kirk Ruths, MD

## 2019-09-12 ENCOUNTER — Other Ambulatory Visit: Payer: Self-pay

## 2019-09-12 ENCOUNTER — Ambulatory Visit (INDEPENDENT_AMBULATORY_CARE_PROVIDER_SITE_OTHER): Payer: Medicare Other | Admitting: General Practice

## 2019-09-12 DIAGNOSIS — Z7901 Long term (current) use of anticoagulants: Secondary | ICD-10-CM

## 2019-09-12 LAB — POCT INR: INR: 3 (ref 2.0–3.0)

## 2019-09-12 NOTE — Progress Notes (Signed)
Medical screening examination/treatment/procedure(s) were performed by non-physician practitioner and as supervising physician I was immediately available for consultation/collaboration. I agree with above. Bethanie Bloxom, MD   

## 2019-09-12 NOTE — Patient Instructions (Signed)
.  lbpcmh  Continue to take 1/2 tablet daily except 1 tablet on Monday/Wednesday and Friday.  Re-check in 6 weeks.

## 2019-09-16 DIAGNOSIS — R3915 Urgency of urination: Secondary | ICD-10-CM | POA: Diagnosis not present

## 2019-09-17 ENCOUNTER — Ambulatory Visit: Payer: Medicare Other | Admitting: Cardiology

## 2019-09-17 ENCOUNTER — Encounter: Payer: Self-pay | Admitting: Cardiology

## 2019-09-17 ENCOUNTER — Other Ambulatory Visit: Payer: Self-pay

## 2019-09-17 VITALS — BP 140/72 | HR 82 | Temp 97.3°F | Ht 66.0 in | Wt 172.0 lb

## 2019-09-17 DIAGNOSIS — I4821 Permanent atrial fibrillation: Secondary | ICD-10-CM

## 2019-09-17 DIAGNOSIS — Z952 Presence of prosthetic heart valve: Secondary | ICD-10-CM | POA: Diagnosis not present

## 2019-09-17 DIAGNOSIS — I5032 Chronic diastolic (congestive) heart failure: Secondary | ICD-10-CM

## 2019-09-17 NOTE — Patient Instructions (Signed)
Medication Instructions:  NO CHANGE *If you need a refill on your cardiac medications before your next appointment, please call your pharmacy*  Lab Work: Your physician recommends that you HAVE LAB WORK TODAY If you have labs (blood work) drawn today and your tests are completely normal, you will receive your results only by: Marland Kitchen MyChart Message (if you have MyChart) OR . A paper copy in the mail If you have any lab test that is abnormal or we need to change your treatment, we will call you to review the results.  Follow-Up: At [facility], you and your health needs are our priority.  As part of our continuing mission to provide you with exceptional heart care, we have created designated Provider Care Teams.  These Care Teams include your primary Cardiologist (physician) and Advanced Practice Providers (APPs -  Physician Assistants and Nurse Practitioners) who all work together to provide you with the care you need, when you need it.  Your next appointment:   6 month(s)  The format for your next appointment:   Either In Person or Virtual  Provider:   You may see Kirk Ruths MD or one of the following Advanced Practice Providers on your designated Care Team:    Kerin Ransom, PA-C  [place], [place]  Coletta Memos, [place]

## 2019-09-18 ENCOUNTER — Encounter: Payer: Self-pay | Admitting: *Deleted

## 2019-09-18 LAB — BASIC METABOLIC PANEL
BUN/Creatinine Ratio: 15 (ref 10–24)
BUN: 14 mg/dL (ref 8–27)
CO2: 28 mmol/L (ref 20–29)
Calcium: 9.1 mg/dL (ref 8.6–10.2)
Chloride: 100 mmol/L (ref 96–106)
Creatinine, Ser: 0.92 mg/dL (ref 0.76–1.27)
GFR calc Af Amer: 90 mL/min/{1.73_m2} (ref >59)
GFR calc non Af Amer: 78 mL/min/{1.73_m2} (ref >59)
Glucose: 94 mg/dL (ref 65–99)
Potassium: 3.7 mmol/L (ref 3.5–5.2)
Sodium: 142 mmol/L (ref 134–144)

## 2019-09-24 ENCOUNTER — Ambulatory Visit (INDEPENDENT_AMBULATORY_CARE_PROVIDER_SITE_OTHER): Payer: Medicare Other | Admitting: Internal Medicine

## 2019-09-24 ENCOUNTER — Encounter: Payer: Self-pay | Admitting: Internal Medicine

## 2019-09-24 ENCOUNTER — Other Ambulatory Visit: Payer: Self-pay

## 2019-09-24 ENCOUNTER — Other Ambulatory Visit: Payer: Self-pay | Admitting: Internal Medicine

## 2019-09-24 VITALS — BP 140/90 | HR 76 | Temp 97.3°F | Ht 66.0 in | Wt 175.3 lb

## 2019-09-24 DIAGNOSIS — I359 Nonrheumatic aortic valve disorder, unspecified: Secondary | ICD-10-CM

## 2019-09-24 DIAGNOSIS — I5032 Chronic diastolic (congestive) heart failure: Secondary | ICD-10-CM

## 2019-09-24 DIAGNOSIS — E538 Deficiency of other specified B group vitamins: Secondary | ICD-10-CM | POA: Insufficient documentation

## 2019-09-24 DIAGNOSIS — I482 Chronic atrial fibrillation, unspecified: Secondary | ICD-10-CM | POA: Diagnosis not present

## 2019-09-24 DIAGNOSIS — Z0000 Encounter for general adult medical examination without abnormal findings: Secondary | ICD-10-CM

## 2019-09-24 DIAGNOSIS — E559 Vitamin D deficiency, unspecified: Secondary | ICD-10-CM | POA: Insufficient documentation

## 2019-09-24 DIAGNOSIS — M1990 Unspecified osteoarthritis, unspecified site: Secondary | ICD-10-CM | POA: Diagnosis not present

## 2019-09-24 DIAGNOSIS — I10 Essential (primary) hypertension: Secondary | ICD-10-CM | POA: Diagnosis not present

## 2019-09-24 DIAGNOSIS — Z7901 Long term (current) use of anticoagulants: Secondary | ICD-10-CM

## 2019-09-24 DIAGNOSIS — E876 Hypokalemia: Secondary | ICD-10-CM

## 2019-09-24 LAB — CBC WITH DIFFERENTIAL/PLATELET
Basophils Absolute: 0 10*3/uL (ref 0.0–0.1)
Basophils Relative: 0.5 % (ref 0.0–3.0)
Eosinophils Absolute: 0.1 10*3/uL (ref 0.0–0.7)
Eosinophils Relative: 0.9 % (ref 0.0–5.0)
HCT: 41.3 % (ref 39.0–52.0)
Hemoglobin: 13.7 g/dL (ref 13.0–17.0)
Lymphocytes Relative: 10 % — ABNORMAL LOW (ref 12.0–46.0)
Lymphs Abs: 0.8 10*3/uL (ref 0.7–4.0)
MCHC: 33.2 g/dL (ref 30.0–36.0)
MCV: 99 fl (ref 78.0–100.0)
Monocytes Absolute: 0.4 10*3/uL (ref 0.1–1.0)
Monocytes Relative: 5.6 % (ref 3.0–12.0)
Neutro Abs: 6.6 10*3/uL (ref 1.4–7.7)
Neutrophils Relative %: 83 % — ABNORMAL HIGH (ref 43.0–77.0)
Platelets: 185 10*3/uL (ref 150.0–400.0)
RBC: 4.17 Mil/uL — ABNORMAL LOW (ref 4.22–5.81)
RDW: 14.3 % (ref 11.5–15.5)
WBC: 7.9 10*3/uL (ref 4.0–10.5)

## 2019-09-24 LAB — LIPID PANEL
Cholesterol: 207 mg/dL — ABNORMAL HIGH (ref 0–200)
HDL: 59.8 mg/dL (ref >39.00)
LDL Cholesterol: 127 mg/dL — ABNORMAL HIGH (ref 0–99)
NonHDL: 146.97
Total CHOL/HDL Ratio: 3
Triglycerides: 102 mg/dL (ref 0.0–149.0)
VLDL: 20.4 mg/dL (ref 0.0–40.0)

## 2019-09-24 LAB — COMPREHENSIVE METABOLIC PANEL
ALT: 10 U/L (ref 0–53)
AST: 19 U/L (ref 0–37)
Albumin: 4.2 g/dL (ref 3.5–5.2)
Alkaline Phosphatase: 63 U/L (ref 39–117)
BUN: 17 mg/dL (ref 6–23)
CO2: 35 mEq/L — ABNORMAL HIGH (ref 19–32)
Calcium: 9.4 mg/dL (ref 8.4–10.5)
Chloride: 99 mEq/L (ref 96–112)
Creatinine, Ser: 0.94 mg/dL (ref 0.40–1.50)
GFR: 76.87 mL/min (ref >60.00)
Glucose, Bld: 94 mg/dL (ref 70–99)
Potassium: 3.3 mEq/L — ABNORMAL LOW (ref 3.5–5.1)
Sodium: 140 mEq/L (ref 135–145)
Total Bilirubin: 0.9 mg/dL (ref 0.2–1.2)
Total Protein: 6.7 g/dL (ref 6.0–8.3)

## 2019-09-24 LAB — VITAMIN D 25 HYDROXY (VIT D DEFICIENCY, FRACTURES): VITD: 25.91 ng/mL — ABNORMAL LOW (ref 30.00–100.00)

## 2019-09-24 LAB — VITAMIN B12: Vitamin B-12: 178 pg/mL — ABNORMAL LOW (ref 211–911)

## 2019-09-24 LAB — TSH: TSH: 0.66 u[IU]/mL (ref 0.35–4.50)

## 2019-09-24 LAB — HEMOGLOBIN A1C: Hgb A1c MFr Bld: 5.3 % (ref 4.6–6.5)

## 2019-09-24 MED ORDER — POTASSIUM CHLORIDE CRYS ER 20 MEQ PO TBCR: 40.0000 meq | EXTENDED_RELEASE_TABLET | Freq: Every day | ORAL | 0 refills | Status: DC

## 2019-09-24 MED ORDER — VITAMIN D (ERGOCALCIFEROL) 1.25 MG (50000 UNIT) PO CAPS: 50000.0000 [IU] | ORAL_CAPSULE | ORAL | 0 refills | Status: AC

## 2019-09-24 NOTE — Progress Notes (Signed)
Established Patient Office Visit     This visit occurred during the SARS-CoV-2 public health emergency.  Safety protocols were in place, including screening questions prior to the visit, additional usage of staff PPE, and extensive cleaning of exam room while observing appropriate contact time as indicated for disinfecting solutions.    CC/Reason for Visit: Annual preventive exam and subsequent Medicare wellness visit  HPI: [name] is a 84 y.o. male who is coming in today for the above mentioned reasons. Past Medical History is significant for: Hypertension, atrial fibrillation, aortic valve replacement on Coumadin, BPH, chronic low back pain and osteoarthritis of the hands.  He has been doing well.  He has no acute complaints today.  He has routine eye and dental care.  He is on the waiting list for his Covid vaccination, he is due for Tdap.   Past Medical/Surgical History: Past Medical History:  Diagnosis Date  . Aortic stenosis   . Atrial fibrillation ([place])   . Baker cyst    Left knee  . DJD (degenerative joint disease)    knees  . Dysrhythmia   . Eczema   . Erectile dysfunction   . Gout   . HTN (hypertension)   . MVP (mitral valve prolapse)   . Shortness of breath dyspnea    with activity    Past Surgical History:  Procedure Laterality Date  .  polyps vocal cord  1994  . AORTIC VALVE REPLACEMENT  ~2004  . HERNIA REPAIR  ~1978  . HERNIA REPAIR  ~1983  . LUMBAR LAMINECTOMY/DECOMPRESSION MICRODISCECTOMY N/A 01/15/2015   Procedure: 2 LEVEL DECOMPRESSIVE LUMBAR LAMINECTOMY L3-L4,L4-L5;  Surgeon: Latanya Maudlin, MD;  Location: [facility];  Service: Orthopedics;  Laterality: N/A;  . LUMBAR LAMINECTOMY/DECOMPRESSION MICRODISCECTOMY N/A 05/01/2017   Procedure: Lumbar one-Lumbar two, Lumbar two-Lumbar three, Lumbar three-Lumbar four Laminectomy; Evacuation of hematoma;  Surgeon: Consuella Lose, MD;  Location: [facility];  Service: Neurosurgery;  Laterality: N/A;  . right  total hip  ~4 years ago  . ROTATOR CUFF REPAIR Left ~2009  . TOTAL KNEE ARTHROPLASTY Left 04/14/2015   Procedure: LEFT TOTAL KNEE ARTHROPLASTY;  Surgeon: Latanya Maudlin, MD;  Location: [facility];  Service: Orthopedics;  Laterality: Left;    Social History:  reports that he quit smoking about 17 years ago. His smoking use included cigarettes. He quit after 30.00 years of use. He has never used smokeless tobacco. He reports current alcohol use. He reports that he does not use drugs.  Allergies: No Known Allergies  Family History:  Family History  Problem Relation Age of Onset  . Diabetes Other   . Stroke Other      Current Outpatient Medications:  .  allopurinol (ZYLOPRIM) 300 MG tablet, Take 1 tablet by mouth once daily, Disp: 90 tablet, Rfl: 0 .  amLODipine (NORVASC) 5 MG tablet, Take 1 tablet by mouth once daily, Disp: 90 tablet, Rfl: 0 .  furosemide (LASIX) 40 MG tablet, Take 1 tablet by mouth once daily, Disp: 90 tablet, Rfl: 2 .  IRON PO, Take 1 tablet by mouth daily., Disp: , Rfl:  .  predniSONE (DELTASONE) 20 MG tablet, TAKE 0.5 TABLETS (10 MG TOTAL) BY MOUTH DAILY WITH BREAKFAST, Disp: 32 tablet, Rfl: 0 .  tamsulosin (FLOMAX) 0.4 MG CAPS capsule, TAKE ONE CAPSULE BY MOUTH ONCE DAILY AFTER BREAKFAST, Disp: 90 capsule, Rfl: 1 .  traMADol (ULTRAM) 50 MG tablet, TAKE 1/2 (ONE-HALF) TABLET BY MOUTH TWICE DAILY FOR  CHRONIC  PAIN, Disp:  90 tablet, Rfl: 0 .  traZODone (DESYREL) 50 MG tablet, Take 1 tablet (50 mg total) by mouth at bedtime as needed., Disp: 90 tablet, Rfl: 4 .  triamcinolone cream (KENALOG) 0.1 %, CREAM TO AFFECTED AREA TWICE DAILY, Disp: 80 g, Rfl: 3 .  warfarin (COUMADIN) 5 MG tablet, Take 1 tablet (5 mg total) by mouth daily. Take 1/2 tablet daily except 1 tablet on Mon Wed and Fri or Take as directed by anticoagulation clinic  90 day, Disp: 75 tablet, Rfl: 1  Review of Systems:  Constitutional: Denies fever, chills, diaphoresis, appetite change and fatigue.  HEENT:  Denies photophobia, eye pain, redness, hearing loss, ear pain, congestion, sore throat, rhinorrhea, sneezing, mouth sores, trouble swallowing, neck pain, neck stiffness and tinnitus.   Respiratory: Denies SOB, DOE, cough, chest tightness,  and wheezing.   Cardiovascular: Denies chest pain, palpitations and leg swelling.  Gastrointestinal: Denies nausea, vomiting, abdominal pain, diarrhea, constipation, blood in stool and abdominal distention.  Genitourinary: Denies dysuria, urgency, frequency, hematuria, flank pain and difficulty urinating.  Endocrine: Denies: hot or cold intolerance, sweats, changes in hair or nails, polyuria, polydipsia. Musculoskeletal: Denies myalgias, back pain, joint swelling, arthralgias and gait problem.  Skin: Denies pallor, rash and wound.  Neurological: Denies dizziness, seizures, syncope, weakness, light-headedness, numbness and headaches.  Hematological: Denies adenopathy. Easy bruising, personal or family bleeding history  Psychiatric/Behavioral: Denies suicidal ideation, mood changes, confusion, nervousness, sleep disturbance and agitation    Physical Exam: Vitals:   09/24/19 0825  BP: 140/90  Pulse: 76  Temp: (!) 97.3 F (36.3 C)  TempSrc: Temporal  SpO2: 96%  Weight: 175 lb 4.8 oz (79.5 kg)  Height: '5\' 6"'$  (1.676 m)    Body mass index is 28.29 kg/m.   Constitutional: NAD, calm, comfortable Eyes: PERRL, lids and conjunctivae normal, wears corrective lenses ENMT: Mucous membranes are moist.Tympanic membrane is pearly white, no erythema or bulging. Neck: normal, supple, no masses, no thyromegaly Respiratory: clear to auscultation bilaterally, no wheezing, no crackles. Normal respiratory effort. No accessory muscle use.  Cardiovascular: Regular rate and rhythm, no murmurs / rubs / gallops. No extremity edema. 2+ pedal pulses. No carotid bruits.  Abdomen: no tenderness, no masses palpated. No hepatosplenomegaly. Bowel sounds positive.    Musculoskeletal: no clubbing / cyanosis. No joint deformity upper and lower extremities. Good ROM, no contractures. Normal muscle tone.  Skin: no rashes, lesions, ulcers. No induration Neurologic: CN 2-12 grossly intact. Sensation intact, DTR normal. Strength 5/5 in all 4.  Psychiatric: Normal judgment and insight. Alert and oriented x 3. Normal mood.   Subsequent Medicare wellness visit   1. Risk factors, based on past  M,S,F -cardiovascular disease risk factors include age, gender, hyperlipidemia, history of hypertension, history of heart failure   2.  Physical activities: He walks every day   3.  Depression/mood:  Stable, not depressed   4.  Hearing:  No perceived issues   5.  ADL's: Independent in all ADLs   6.  Fall risk:  Low fall risk   7.  Home safety: No problems identified   8.  Height weight, and visual acuity: Height and weight as above, visual acuity is 20/20 with each eye independently and together   9.  Counseling:  Advised to take ambulatory blood pressure monitoring 2-3 times a week   10. Lab orders based on risk factors: Laboratory update will be reviewed   11. Referral :  None today   12. Care plan:  Follow-up with me in  6 months   13. Cognitive assessment:  No cognitive impairment identified   14. Screening: Patient provided with a written and personalized 5-10 year screening schedule in the AVS.   yes   15. Provider List Update:   PCP, cardiologist, Dr. Stanford Breed.  16. Advance Directives: Full code     Office Visit from 09/24/2019 in [facility] [place]  PHQ-9 Total Score  0      Fall Risk  09/24/2019 09/17/2018 07/18/2018 09/11/2016 06/16/2015  Falls in the past year? 0 0 0 No No  Number falls in past yr: 0 0 0 - -  Injury with Fall? 0 0 0 - -     Impression and Plan:  Encounter for preventive health examination  -He has routine eye and dental care. -Due for Tdap booster, COVID-19 (on a waiting list) otherwise immunizations are  up-to-date  Chronic diastolic CHF (congestive heart failure) ([place]) -Compensated, followed by cardiology  Essential hypertension  -Blood pressure is high today to 140/90, he will do ambulatory blood pressure monitoring and send me his numbers after he has recorded 2 to 3 weeks worth of data.  Chronic atrial fibrillation (HCC) Aortic valve disorder Long term (current) use of anticoagulants -Rate controlled, anticoagulated on Coumadin.  Osteoarthritis, unspecified osteoarthritis type, unspecified site -On chronic prednisone therapy    Patient Instructions  -Nice seeing you today!!  -Lab work today; will notify you once results are available.  -Schedule a 6 month follow up.   Preventive Care 49 Years and Older, Male Preventive care refers to lifestyle choices and visits with your health care provider that can promote health and wellness. This includes:  A yearly physical exam. This is also called an annual well check.  Regular dental and eye exams.  Immunizations.  Screening for certain conditions.  Healthy lifestyle choices, such as diet and exercise. What can I expect for my preventive care visit? Physical exam Your health care provider will check:  Height and weight. These may be used to calculate body mass index (BMI), which is a measurement that tells if you are at a healthy weight.  Heart rate and blood pressure.  Your skin for abnormal spots. Counseling Your health care provider may ask you questions about:  Alcohol, tobacco, and drug use.  Emotional well-being.  Home and relationship well-being.  Sexual activity.  Eating habits.  History of falls.  Memory and ability to understand (cognition).  Work and work Statistician. What immunizations do I need?  Influenza (flu) vaccine  This is recommended every year. Tetanus, diphtheria, and pertussis (Tdap) vaccine  You may need a Td booster every 10 years. Varicella (chickenpox) vaccine  You may  need this vaccine if you have not already been vaccinated. Zoster (shingles) vaccine  You may need this after age 38. Pneumococcal conjugate (PCV13) vaccine  One dose is recommended after age 65. Pneumococcal polysaccharide (PPSV23) vaccine  One dose is recommended after age 14. Measles, mumps, and rubella (MMR) vaccine  You may need at least one dose of MMR if you were born in 1957 or later. You may also need a second dose. Meningococcal conjugate (MenACWY) vaccine  You may need this if you have certain conditions. Hepatitis A vaccine  You may need this if you have certain conditions or if you travel or work in places where you may be exposed to hepatitis A. Hepatitis B vaccine  You may need this if you have certain conditions or if you travel or work in places where you  may be exposed to hepatitis B. Haemophilus influenzae type b (Hib) vaccine  You may need this if you have certain conditions. You may receive vaccines as individual doses or as more than one vaccine together in one shot (combination vaccines). Talk with your health care provider about the risks and benefits of combination vaccines. What tests do I need? Blood tests  Lipid and cholesterol levels. These may be checked every 5 years, or more frequently depending on your overall health.  Hepatitis C test.  Hepatitis B test. Screening  Lung cancer screening. You may have this screening every year starting at age 51 if you have a 30-pack-year history of smoking and currently smoke or have quit within the past 15 years.  Colorectal cancer screening. All adults should have this screening starting at age 35 and continuing until age 60. Your health care provider may recommend screening at age 6 if you are at increased risk. You will have tests every 1-10 years, depending on your results and the type of screening test.  Prostate cancer screening. Recommendations will vary depending on your family history and other  risks.  Diabetes screening. This is done by checking your blood sugar (glucose) after you have not eaten for a while (fasting). You may have this done every 1-3 years.  Abdominal aortic aneurysm (AAA) screening. You may need this if you are a current or former smoker.  Sexually transmitted disease (STD) testing. Follow these instructions at home: Eating and drinking  Eat a diet that includes fresh fruits and vegetables, whole grains, lean protein, and low-fat dairy products. Limit your intake of foods with high amounts of sugar, saturated fats, and salt.  Take vitamin and mineral supplements as recommended by your health care provider.  Do not drink alcohol if your health care provider tells you not to drink.  If you drink alcohol: ? Limit how much you have to 0-2 drinks a day. ? Be aware of how much alcohol is in your drink. In the [place] one drink equals one 12 oz bottle of beer (355 mL), one 5 oz glass of wine (148 mL), or one 1 oz glass of hard liquor (44 mL). Lifestyle  Take daily care of your teeth and gums.  Stay active. Exercise for at least 30 minutes on 5 or more days each week.  Do not use any products that contain nicotine or tobacco, such as cigarettes, e-cigarettes, and chewing tobacco. If you need help quitting, ask your health care provider.  If you are sexually active, practice safe sex. Use a condom or other form of protection to prevent STIs (sexually transmitted infections).  Talk with your health care provider about taking a low-dose aspirin or statin. What's next?  Visit your health care provider once a year for a well check visit.  Ask your health care provider how often you should have your eyes and teeth checked.  Stay up to date on all vaccines. This information is not intended to replace advice given to you by your health care provider. Make sure you discuss any questions you have with your health care provider. Document Revised: 08/08/2018 Document  Reviewed: 08/08/2018 Elsevier Patient Education  2020 [place], [place] [facility]

## 2019-09-24 NOTE — Patient Instructions (Signed)
-Nice seeing you today!!  -Lab work today; will notify you once results are available.  -Schedule a 6 month follow up.   Preventive Care 8 Years and Older, Male Preventive care refers to lifestyle choices and visits with your health care provider that can promote health and wellness. This includes:  A yearly physical exam. This is also called an annual well check.  Regular dental and eye exams.  Immunizations.  Screening for certain conditions.  Healthy lifestyle choices, such as diet and exercise. What can I expect for my preventive care visit? Physical exam Your health care provider will check:  Height and weight. These may be used to calculate body mass index (BMI), which is a measurement that tells if you are at a healthy weight.  Heart rate and blood pressure.  Your skin for abnormal spots. Counseling Your health care provider may ask you questions about:  Alcohol, tobacco, and drug use.  Emotional well-being.  Home and relationship well-being.  Sexual activity.  Eating habits.  History of falls.  Memory and ability to understand (cognition).  Work and work Statistician. What immunizations do I need?  Influenza (flu) vaccine  This is recommended every year. Tetanus, diphtheria, and pertussis (Tdap) vaccine  You may need a Td booster every 10 years. Varicella (chickenpox) vaccine  You may need this vaccine if you have not already been vaccinated. Zoster (shingles) vaccine  You may need this after age 31. Pneumococcal conjugate (PCV13) vaccine  One dose is recommended after age 30. Pneumococcal polysaccharide (PPSV23) vaccine  One dose is recommended after age 17. Measles, mumps, and rubella (MMR) vaccine  You may need at least one dose of MMR if you were born in 1957 or later. You may also need a second dose. Meningococcal conjugate (MenACWY) vaccine  You may need this if you have certain conditions. Hepatitis A vaccine  You may need this  if you have certain conditions or if you travel or work in places where you may be exposed to hepatitis A. Hepatitis B vaccine  You may need this if you have certain conditions or if you travel or work in places where you may be exposed to hepatitis B. Haemophilus influenzae type b (Hib) vaccine  You may need this if you have certain conditions. You may receive vaccines as individual doses or as more than one vaccine together in one shot (combination vaccines). Talk with your health care provider about the risks and benefits of combination vaccines. What tests do I need? Blood tests  Lipid and cholesterol levels. These may be checked every 5 years, or more frequently depending on your overall health.  Hepatitis C test.  Hepatitis B test. Screening  Lung cancer screening. You may have this screening every year starting at age 2 if you have a 30-pack-year history of smoking and currently smoke or have quit within the past 15 years.  Colorectal cancer screening. All adults should have this screening starting at age 6 and continuing until age 43. Your health care provider may recommend screening at age 60 if you are at increased risk. You will have tests every 1-10 years, depending on your results and the type of screening test.  Prostate cancer screening. Recommendations will vary depending on your family history and other risks.  Diabetes screening. This is done by checking your blood sugar (glucose) after you have not eaten for a while (fasting). You may have this done every 1-3 years.  Abdominal aortic aneurysm (AAA) screening. You may need this  if you are a current or former smoker.  Sexually transmitted disease (STD) testing. Follow these instructions at home: Eating and drinking  Eat a diet that includes fresh fruits and vegetables, whole grains, lean protein, and low-fat dairy products. Limit your intake of foods with high amounts of sugar, saturated fats, and salt.  Take  vitamin and mineral supplements as recommended by your health care provider.  Do not drink alcohol if your health care provider tells you not to drink.  If you drink alcohol: ? Limit how much you have to 0-2 drinks a day. ? Be aware of how much alcohol is in your drink. In the [place] one drink equals one 12 oz bottle of beer (355 mL), one 5 oz glass of wine (148 mL), or one 1 oz glass of hard liquor (44 mL). Lifestyle  Take daily care of your teeth and gums.  Stay active. Exercise for at least 30 minutes on 5 or more days each week.  Do not use any products that contain nicotine or tobacco, such as cigarettes, e-cigarettes, and chewing tobacco. If you need help quitting, ask your health care provider.  If you are sexually active, practice safe sex. Use a condom or other form of protection to prevent STIs (sexually transmitted infections).  Talk with your health care provider about taking a low-dose aspirin or statin. What's next?  Visit your health care provider once a year for a well check visit.  Ask your health care provider how often you should have your eyes and teeth checked.  Stay up to date on all vaccines. This information is not intended to replace advice given to you by your health care provider. Make sure you discuss any questions you have with your health care provider. Document Revised: 08/08/2018 Document Reviewed: 08/08/2018 Elsevier Patient Education  2020 [employer].

## 2019-09-25 ENCOUNTER — Telehealth: Payer: Self-pay | Admitting: Internal Medicine

## 2019-09-25 ENCOUNTER — Other Ambulatory Visit: Payer: Self-pay | Admitting: Internal Medicine

## 2019-09-25 DIAGNOSIS — E559 Vitamin D deficiency, unspecified: Secondary | ICD-10-CM

## 2019-09-25 MED ORDER — CYANOCOBALAMIN 1000 MCG/ML IJ SOLN: INTRAMUSCULAR | 3 refills | Status: DC

## 2019-09-25 MED ORDER — "BD SAFETYGLIDE SYRINGE/NEEDLE 25G X 1"" 3 ML MISC": 11 refills | Status: DC

## 2019-09-25 NOTE — Telephone Encounter (Signed)
Pt returning call regarding lab results. Thanks

## 2019-09-26 ENCOUNTER — Telehealth: Payer: Self-pay | Admitting: Internal Medicine

## 2019-09-26 NOTE — Telephone Encounter (Signed)
Pt is returning call. Thanks

## 2019-09-26 NOTE — Telephone Encounter (Signed)
Spoke with patient and reviewed lab results. 

## 2019-09-26 NOTE — Telephone Encounter (Signed)
Reviewed labs with patient.  See lab result note.

## 2019-09-29 NOTE — Telephone Encounter (Signed)
Pt called back in stating that he would like to have a call back so that he can compare his lab results from our office with the ones from his cardiologist.

## 2019-09-30 NOTE — Telephone Encounter (Signed)
Spoke with patient and reviewed lab results. Copy mailed to home address per patient request.

## 2019-10-04 ENCOUNTER — Other Ambulatory Visit: Payer: Self-pay | Admitting: Internal Medicine

## 2019-10-04 DIAGNOSIS — Z7952 Long term (current) use of systemic steroids: Secondary | ICD-10-CM

## 2019-10-21 ENCOUNTER — Ambulatory Visit (INDEPENDENT_AMBULATORY_CARE_PROVIDER_SITE_OTHER): Payer: Medicare Other | Admitting: General Practice

## 2019-10-21 ENCOUNTER — Other Ambulatory Visit: Payer: Self-pay

## 2019-10-21 DIAGNOSIS — Z7901 Long term (current) use of anticoagulants: Secondary | ICD-10-CM

## 2019-10-21 LAB — POCT INR: INR: 2.6 (ref 2.0–3.0)

## 2019-10-21 NOTE — Patient Instructions (Signed)
Pre visit review using our clinic review tool, if applicable. No additional management support is needed unless otherwise documented below in the visit note.  Continue to take 1/2 tablet daily except 1 tablet on Monday/Wednesday and Friday.  Re-check in 6 weeks.

## 2019-10-21 NOTE — Progress Notes (Signed)
Medical screening examination/treatment/procedure(s) were performed by non-physician practitioner and as supervising physician I was immediately available for consultation/collaboration. I agree with above. Kirke Breach, MD   

## 2019-10-24 ENCOUNTER — Other Ambulatory Visit: Payer: Self-pay | Admitting: Internal Medicine

## 2019-10-25 ENCOUNTER — Ambulatory Visit: Payer: Medicare Other | Attending: Internal Medicine

## 2019-10-25 DIAGNOSIS — Z23 Encounter for immunization: Secondary | ICD-10-CM | POA: Insufficient documentation

## 2019-10-25 NOTE — Progress Notes (Signed)
   Covid-19 Vaccination Clinic  Name:  NASRI BOAKYE    MRN: [id number] DOB: 1936-07-26  10/25/2019  Mr. Wendt was observed post Covid-19 immunization for 15 minutes without incidence. He was provided with Vaccine Information Sheet and instruction to access the V-Safe system.   Mr. Cullers was instructed to call 911 with any severe reactions post vaccine: Marland Kitchen Difficulty breathing  . Swelling of your face and throat  . A fast heartbeat  . A bad rash all over your body  . Dizziness and weakness    Immunizations Administered    Name Date Dose VIS Date Route   Moderna COVID-19 Vaccine 10/25/2019 12:59 PM 0.5 mL 07/29/2019 Intramuscular   Manufacturer: Moderna   Lot: [id number]   [place]: [id number]

## 2019-11-22 ENCOUNTER — Ambulatory Visit: Payer: Medicare Other | Attending: Internal Medicine

## 2019-11-22 DIAGNOSIS — Z23 Encounter for immunization: Secondary | ICD-10-CM

## 2019-11-22 NOTE — Progress Notes (Signed)
   Covid-19 Vaccination Clinic  Name:  [name]    MRN: [id number] DOB: 14-Dec-1935  11/22/2019  [name] was observed post Covid-19 immunization for 15 minutes without incident. He was provided with Vaccine Information Sheet and instruction to access the V-Safe system.   [name] was instructed to call 911 with any severe reactions post vaccine: Marland Kitchen Difficulty breathing  . Swelling of face and throat  . A fast heartbeat  . A bad rash all over body  . Dizziness and weakness   Immunizations Administered    Name Date Dose VIS Date Route   Moderna COVID-19 Vaccine 11/22/2019  1:22 PM 0.5 mL 07/29/2019 Intramuscular   Manufacturer: Levan Hurst   Lot: [id number]   [place]: [id number]

## 2019-11-24 ENCOUNTER — Telehealth: Payer: Self-pay | Admitting: Internal Medicine

## 2019-11-24 NOTE — Telephone Encounter (Addendum)
Diane from [facility] calls stated he has a screening quanta-flow study that showed left moderate PAD. She wanted to inform PCP.  Diane can be reached at [phone]

## 2019-11-25 ENCOUNTER — Other Ambulatory Visit: Payer: Self-pay | Admitting: Internal Medicine

## 2019-11-25 NOTE — Telephone Encounter (Signed)
Spoke to patient and he is "feeling fine". Spoke to the nurse and she suggested a doppler or a referral to a vascular surgeon. Dr Jerilee Hoh is aware.

## 2019-11-25 NOTE — Telephone Encounter (Signed)
Can we get more info? What is a quanta flow study? Left PAD? Were these arterial dopplers? Why were they performed?

## 2019-12-02 ENCOUNTER — Ambulatory Visit (INDEPENDENT_AMBULATORY_CARE_PROVIDER_SITE_OTHER): Payer: Medicare Other | Admitting: General Practice

## 2019-12-02 ENCOUNTER — Other Ambulatory Visit: Payer: Self-pay

## 2019-12-02 DIAGNOSIS — Z7901 Long term (current) use of anticoagulants: Secondary | ICD-10-CM | POA: Diagnosis not present

## 2019-12-02 DIAGNOSIS — I4891 Unspecified atrial fibrillation: Secondary | ICD-10-CM

## 2019-12-02 LAB — POCT INR: INR: 2.5 (ref 2.0–3.0)

## 2019-12-02 NOTE — Patient Instructions (Signed)
Pre visit review using our clinic review tool, if applicable. No additional management support is needed unless otherwise documented below in the visit note.  Continue to take 1/2 tablet daily except 1 tablet on Monday/Wednesday and Friday.  Re-check in 6 weeks.

## 2019-12-02 NOTE — Progress Notes (Signed)
Medical screening examination/treatment/procedure(s) were performed by non-physician practitioner and as supervising physician I was immediately available for consultation/collaboration. I agree with above. James John, MD   

## 2019-12-23 ENCOUNTER — Other Ambulatory Visit: Payer: Self-pay

## 2019-12-24 ENCOUNTER — Other Ambulatory Visit (INDEPENDENT_AMBULATORY_CARE_PROVIDER_SITE_OTHER): Payer: Medicare Other

## 2019-12-24 DIAGNOSIS — E559 Vitamin D deficiency, unspecified: Secondary | ICD-10-CM

## 2019-12-24 LAB — VITAMIN D 25 HYDROXY (VIT D DEFICIENCY, FRACTURES): VITD: 54.78 ng/mL (ref 30.00–100.00)

## 2020-01-13 ENCOUNTER — Ambulatory Visit: Payer: Medicare Other

## 2020-01-15 ENCOUNTER — Other Ambulatory Visit: Payer: Self-pay | Admitting: Family

## 2020-01-22 ENCOUNTER — Other Ambulatory Visit: Payer: Self-pay

## 2020-01-22 ENCOUNTER — Ambulatory Visit (INDEPENDENT_AMBULATORY_CARE_PROVIDER_SITE_OTHER): Payer: Medicare Other | Admitting: General Practice

## 2020-01-22 DIAGNOSIS — Z7901 Long term (current) use of anticoagulants: Secondary | ICD-10-CM | POA: Diagnosis not present

## 2020-01-22 LAB — POCT INR: INR: 2.8 (ref 2.0–3.0)

## 2020-01-22 NOTE — Progress Notes (Signed)
Medical screening examination/treatment/procedure(s) were performed by non-physician practitioner and as supervising physician I was immediately available for consultation/collaboration. I agree with above. Terrianna Holsclaw, MD   

## 2020-01-22 NOTE — Patient Instructions (Addendum)
Pre visit review using our clinic review tool, if applicable. No additional management support is needed unless otherwise documented below in the visit note.  Continue to take 1/2 tablet daily except 1 tablet on Monday/Wednesday and Friday.  Re-check in 6 weeks.

## 2020-02-09 DIAGNOSIS — H0011 Chalazion right upper eyelid: Secondary | ICD-10-CM | POA: Diagnosis not present

## 2020-02-23 ENCOUNTER — Other Ambulatory Visit: Payer: Self-pay | Admitting: Cardiology

## 2020-02-23 ENCOUNTER — Other Ambulatory Visit: Payer: Self-pay | Admitting: Internal Medicine

## 2020-02-23 DIAGNOSIS — R609 Edema, unspecified: Secondary | ICD-10-CM

## 2020-03-04 ENCOUNTER — Ambulatory Visit (INDEPENDENT_AMBULATORY_CARE_PROVIDER_SITE_OTHER): Payer: Medicare Other | Admitting: General Practice

## 2020-03-04 ENCOUNTER — Other Ambulatory Visit: Payer: Self-pay

## 2020-03-04 DIAGNOSIS — I4891 Unspecified atrial fibrillation: Secondary | ICD-10-CM

## 2020-03-04 DIAGNOSIS — Z7901 Long term (current) use of anticoagulants: Secondary | ICD-10-CM

## 2020-03-04 LAB — POCT INR: INR: 2.3 (ref 2.0–3.0)

## 2020-03-04 NOTE — Progress Notes (Signed)
Medical screening examination/treatment/procedure(s) were performed by non-physician practitioner and as supervising physician I was immediately available for consultation/collaboration. I agree with above. Darcel Zick, MD   

## 2020-03-04 NOTE — Patient Instructions (Addendum)
Pre visit review using our clinic review tool, if applicable. No additional management support is needed unless otherwise documented below in the visit note.  Continue to take 1/2 tablet daily except 1 tablet on Monday/Wednesday and Friday.  Re-check in 6 weeks.

## 2020-03-09 ENCOUNTER — Other Ambulatory Visit: Payer: Self-pay | Admitting: Internal Medicine

## 2020-03-09 DIAGNOSIS — Z7952 Long term (current) use of systemic steroids: Secondary | ICD-10-CM

## 2020-03-09 DIAGNOSIS — Z7901 Long term (current) use of anticoagulants: Secondary | ICD-10-CM

## 2020-03-27 NOTE — Progress Notes (Signed)
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 Preoperative cardiac catheterization revealed normal coronary arteries. He has permanent atrial fibrillation. Previous carotid Dopplers due to bruits revealed 0-39% bilateral stenosis. Admitted9/18with back pain for planned laminectomy. However he was found to have spontaneous epidural hematoma which was evacuated. Discussed with Dr Kathyrn Sheriff and he felt anticoagulation could be carefully resumed.Apixaban resumed atpreviousov.Last echocardiogram April 2019 showed normal LV function, bioprosthetic aortic valve with mean gradient 13 mmHg, moderate to severe left atrial enlargement, moderate to severe right atrial enlargement, mild right ventricular enlargement. Monitor April 2019 showed atrial fibrillation with multiple pauses greater than 3 seconds all occurring late p.m. or early a.m. hours. We asked patient to be seen by electrophysiology following results of previous event monitor for consideration of pacemaker. Patient declined. Since I last sawhim,he has dyspnea with more vigorous activities but not routine activities.  No orthopnea, PND, pedal edema, chest pain or syncope.  Current Outpatient Medications  Medication Sig Dispense Refill  . allopurinol (ZYLOPRIM) 300 MG tablet Take 1 tablet by mouth once daily 90 tablet 0  . amLODipine (NORVASC) 5 MG tablet Take 1 tablet by mouth once daily 90 tablet 0  . cyanocobalamin (,VITAMIN B-12,) 1000 MCG/ML injection Inject 29ml in deltoid once weekly for 4 weeks, then inject 1 ml once a month thereafter 6 mL 3  . furosemide (LASIX) 40 MG tablet Take 1 tablet by mouth once daily 90 tablet 2  . IRON PO Take 1 tablet by mouth daily.    . predniSONE (DELTASONE) 20 MG tablet TAKE 1/2 (ONE-HALF) TABLET BY MOUTH ONCE DAILY WITH BREAKFAST 45 tablet 0  . SYRINGE-NEEDLE, DISP, 3 ML (BD SAFETYGLIDE  SYRINGE/NEEDLE) 25G X 1" 3 ML MISC Inject 77ml in deltoid once weekly for 4 weeks, then inject 1 ml once a month thereafter 100 each 11  . tamsulosin (FLOMAX) 0.4 MG CAPS capsule TAKE 1 CAPSULE BY MOUTH ONCE DAILY AFTER BREAKFAST 90 capsule 0  . traMADol (ULTRAM) 50 MG tablet TAKE 1/2 (ONE-HALF) TABLET BY MOUTH TWICE DAILY FOR  CHRONIC  PAIN 90 tablet 0  . traZODone (DESYREL) 50 MG tablet Take 1 tablet (50 mg total) by mouth at bedtime as needed. 90 tablet 4  . triamcinolone cream (KENALOG) 0.1 % CREAM TO AFFECTED AREA TWICE DAILY 80 g 3  . warfarin (COUMADIN) 5 MG tablet TAKE 1/2 TABLET BY MOUTH DAILY AND 1 TABLET BY MOUTH ON  MONDAY,  WEDNESDAY  AND  FRIDAY- TAKE AS DIRECTED BY ANTICOAGULATION CLINIC 75 tablet 0  . potassium chloride SA (KLOR-CON) 20 MEQ tablet Take 2 tablets (40 mEq total) by mouth daily for 3 days. 6 tablet 0   No current facility-administered medications for this visit.     Past Medical History:  Diagnosis Date  . Aortic stenosis   . Atrial fibrillation ([place])   . Baker cyst    Left knee  . DJD (degenerative joint disease)    knees  . Dysrhythmia   . Eczema   . Erectile dysfunction   . Gout   . HTN (hypertension)   . MVP (mitral valve prolapse)   . Shortness of breath dyspnea    with activity    Past Surgical History:  Procedure Laterality Date  .  polyps vocal cord  1994  . AORTIC VALVE REPLACEMENT  ~2004  . HERNIA REPAIR  ~1978  . HERNIA REPAIR  ~1983  .  LUMBAR LAMINECTOMY/DECOMPRESSION MICRODISCECTOMY N/A 01/15/2015   Procedure: 2 LEVEL DECOMPRESSIVE LUMBAR LAMINECTOMY L3-L4,L4-L5;  Surgeon: Latanya Maudlin, MD;  Location: [facility];  Service: Orthopedics;  Laterality: N/A;  . LUMBAR LAMINECTOMY/DECOMPRESSION MICRODISCECTOMY N/A 05/01/2017   Procedure: Lumbar one-Lumbar two, Lumbar two-Lumbar three, Lumbar three-Lumbar four Laminectomy; Evacuation of hematoma;  Surgeon: Consuella Lose, MD;  Location: [facility];  Service: Neurosurgery;  Laterality: N/A;  . right  total hip  ~4 years ago  . ROTATOR CUFF REPAIR Left ~2009  . TOTAL KNEE ARTHROPLASTY Left 04/14/2015   Procedure: LEFT TOTAL KNEE ARTHROPLASTY;  Surgeon: Latanya Maudlin, MD;  Location: [facility];  Service: Orthopedics;  Laterality: Left;    Social History   Socioeconomic History  . Marital status: Divorced    Spouse name: Not on file  . Number of children: Not on file  . Years of education: Not on file  . Highest education level: Not on file  Occupational History  . Not on file  Tobacco Use  . Smoking status: Former Smoker    Years: 30.00    Types: Cigarettes    Quit date: 08/28/2002    Years since quitting: 17.6  . Smokeless tobacco: Never Used  Vaping Use  . Vaping Use: Never used  Substance and Sexual Activity  . Alcohol use: Yes    Comment: social  . Drug use: No  . Sexual activity: Not on file  Other Topics Concern  . Not on file  Social History Narrative  . Not on file   Social Determinants of Health   Financial Resource Strain:   . Difficulty of Paying Living Expenses:   Food Insecurity:   . Worried About Charity fundraiser in the Last Year:   . Arboriculturist in the Last Year:   Transportation Needs:   . Film/video editor (Medical):   Marland Kitchen Lack of Transportation (Non-Medical):   Physical Activity:   . Days of Exercise per Week:   . Minutes of Exercise per Session:   Stress:   . Feeling of Stress :   Social Connections:   . Frequency of Communication with Friends and Family:   . Frequency of Social Gatherings with Friends and Family:   . Attends Religious Services:   . Active Member of Clubs or Organizations:   . Attends Archivist Meetings:   Marland Kitchen Marital Status:   Intimate Partner Violence:   . Fear of Current or Ex-Partner:   . Emotionally Abused:   Marland Kitchen Physically Abused:   . Sexually Abused:     Family History  Problem Relation Age of Onset  . Diabetes Other   . Stroke Other     ROS: no fevers or chills, productive cough, hemoptysis,  dysphasia, odynophagia, melena, hematochezia, dysuria, hematuria, rash, seizure activity, orthopnea, PND, pedal edema, claudication. Remaining systems are negative.  Physical Exam: Well-developed well-nourished in no acute distress.  Skin is warm and dry.  HEENT is normal.  Neck is supple.  Chest is clear to auscultation with normal expansion.  Cardiovascular exam is irregular, 2/6 systolic murmur Abdominal exam nontender or distended. No masses palpated. Extremities show no edema. neuro grossly intact   A/P  1 permanent atrial fibrillation-patient's heart rate remains controlled on no medications.  We will continue Coumadin.  He is not on Xarelto or apixaban due to expense.  Check hemoglobin.  2 hypertension-blood pressure is controlled today.  Continue present medications and follow.  3 prior aortic valve replacement-continue SBE prophylaxis.  4 chronic  diastolic congestive heart failure-he is euvolemic today on examination.  Continue Lasix at present dose.  Check potassium and renal function.  5 history of bradycardia-as outlined above patient was noted to have pauses greater than 3 seconds on previous monitor.  However this typically occurred in the early a.m. hours.  There is no history of syncope.  Kirk Ruths, MD

## 2020-04-02 ENCOUNTER — Other Ambulatory Visit: Payer: Self-pay

## 2020-04-02 ENCOUNTER — Encounter: Payer: Self-pay | Admitting: Cardiology

## 2020-04-02 ENCOUNTER — Encounter: Payer: Self-pay | Admitting: *Deleted

## 2020-04-02 ENCOUNTER — Ambulatory Visit: Payer: Medicare Other | Admitting: Cardiology

## 2020-04-02 VITALS — BP 130/76 | HR 86 | Ht 66.0 in | Wt 171.0 lb

## 2020-04-02 DIAGNOSIS — Z952 Presence of prosthetic heart valve: Secondary | ICD-10-CM | POA: Diagnosis not present

## 2020-04-02 DIAGNOSIS — I5032 Chronic diastolic (congestive) heart failure: Secondary | ICD-10-CM

## 2020-04-02 DIAGNOSIS — I4821 Permanent atrial fibrillation: Secondary | ICD-10-CM

## 2020-04-02 LAB — CBC
Hematocrit: 38.8 % (ref 37.5–51.0)
Hemoglobin: 13 g/dL (ref 13.0–17.7)
MCH: 32.6 pg (ref 26.6–33.0)
MCHC: 33.5 g/dL (ref 31.5–35.7)
MCV: 97 fL (ref 79–97)
Platelets: 174 10*3/uL (ref 150–450)
RBC: 3.99 x10E6/uL — ABNORMAL LOW (ref 4.14–5.80)
RDW: 12.6 % (ref 11.6–15.4)
WBC: 6.7 10*3/uL (ref 3.4–10.8)

## 2020-04-02 LAB — BASIC METABOLIC PANEL
BUN/Creatinine Ratio: 14 (ref 10–24)
BUN: 14 mg/dL (ref 8–27)
CO2: 29 mmol/L (ref 20–29)
Calcium: 9.1 mg/dL (ref 8.6–10.2)
Chloride: 100 mmol/L (ref 96–106)
Creatinine, Ser: 0.99 mg/dL (ref 0.76–1.27)
GFR calc Af Amer: 82 mL/min/{1.73_m2} (ref >59)
GFR calc non Af Amer: 71 mL/min/{1.73_m2} (ref >59)
Glucose: 93 mg/dL (ref 65–99)
Potassium: 3.7 mmol/L (ref 3.5–5.2)
Sodium: 140 mmol/L (ref 134–144)

## 2020-04-02 NOTE — Patient Instructions (Signed)
Medication Instructions:  NO CHANGE *If you need a refill on your cardiac medications before your next appointment, please call your pharmacy*   Lab Work: Your physician recommends that you HAVE LAB WORK TODAY  If you have labs (blood work) drawn today and your tests are completely normal, you will receive your results only by: Marland Kitchen MyChart Message (if you have MyChart) OR . A paper copy in the mail If you have any lab test that is abnormal or we need to change your treatment, we will call you to review the results.   Follow-Up: At [facility], you and your health needs are our priority.  As part of our continuing mission to provide you with exceptional heart care, we have created designated Provider Care Teams.  These Care Teams include your primary Cardiologist (physician) and Advanced Practice Providers (APPs -  Physician Assistants and Nurse Practitioners) who all work together to provide you with the care you need, when you need it.  We recommend signing up for the patient portal called "MyChart".  Sign up information is provided on this After Visit Summary.  MyChart is used to connect with patients for Virtual Visits (Telemedicine).  Patients are able to view lab/test results, encounter notes, upcoming appointments, etc.  Non-urgent messages can be sent to your provider as well.   To learn more about what you can do with MyChart, go to [email or web address].    Your next appointment:   6 month(s)  The format for your next appointment:   In Person  Provider:   You may see Kirk Ruths MD or one of the following Advanced Practice Providers on your designated Care Team:    Kerin Ransom, PA-C  [place], [place]  Coletta Memos, [place]

## 2020-04-08 ENCOUNTER — Other Ambulatory Visit: Payer: Self-pay | Admitting: Internal Medicine

## 2020-04-15 ENCOUNTER — Ambulatory Visit: Payer: Medicare Other

## 2020-04-20 ENCOUNTER — Ambulatory Visit (INDEPENDENT_AMBULATORY_CARE_PROVIDER_SITE_OTHER): Payer: Medicare Other | Admitting: General Practice

## 2020-04-20 ENCOUNTER — Other Ambulatory Visit: Payer: Self-pay

## 2020-04-20 DIAGNOSIS — I4891 Unspecified atrial fibrillation: Secondary | ICD-10-CM

## 2020-04-20 DIAGNOSIS — Z7901 Long term (current) use of anticoagulants: Secondary | ICD-10-CM

## 2020-04-20 LAB — POCT INR: INR: 2.4 (ref 2.0–3.0)

## 2020-04-20 NOTE — Patient Instructions (Addendum)
Pre visit review using our clinic review tool, if applicable. No additional management support is needed unless otherwise documented below in the visit note.  Continue to take 1/2 tablet daily except 1 tablet on Monday/Wednesday and Friday.  Re-check in 6 weeks.

## 2020-05-21 ENCOUNTER — Other Ambulatory Visit: Payer: Self-pay | Admitting: Internal Medicine

## 2020-05-31 DIAGNOSIS — H2513 Age-related nuclear cataract, bilateral: Secondary | ICD-10-CM | POA: Diagnosis not present

## 2020-06-01 ENCOUNTER — Ambulatory Visit (INDEPENDENT_AMBULATORY_CARE_PROVIDER_SITE_OTHER): Payer: Medicare Other | Admitting: General Practice

## 2020-06-01 ENCOUNTER — Other Ambulatory Visit: Payer: Self-pay

## 2020-06-01 DIAGNOSIS — Z7901 Long term (current) use of anticoagulants: Secondary | ICD-10-CM

## 2020-06-01 DIAGNOSIS — I4891 Unspecified atrial fibrillation: Secondary | ICD-10-CM | POA: Diagnosis not present

## 2020-06-01 LAB — POCT INR: INR: 3 (ref 2.0–3.0)

## 2020-06-01 NOTE — Progress Notes (Signed)
Medical screening examination/treatment/procedure(s) were performed by non-physician practitioner and as supervising physician I was immediately available for consultation/collaboration. I agree with above. Yzabella Crunk, MD   

## 2020-06-01 NOTE — Patient Instructions (Addendum)
Pre visit review using our clinic review tool, if applicable. No additional management support is needed unless otherwise documented below in the visit note.  Hold dosage today and then change dosage and take 1/2 tablet daily except 1 tablet on Monday and Friday.  Re-check in 3 weeks.

## 2020-06-03 ENCOUNTER — Other Ambulatory Visit: Payer: Self-pay | Admitting: Internal Medicine

## 2020-06-03 DIAGNOSIS — Z7952 Long term (current) use of systemic steroids: Secondary | ICD-10-CM

## 2020-06-09 ENCOUNTER — Telehealth: Payer: Self-pay | Admitting: Internal Medicine

## 2020-06-09 NOTE — Telephone Encounter (Signed)
Pt call and want a shingles shot and want a call back.

## 2020-06-10 ENCOUNTER — Telehealth: Payer: Self-pay | Admitting: Internal Medicine

## 2020-06-10 NOTE — Telephone Encounter (Signed)
Pt call and want a refill on warfarin (COUMADIN) 5 MG tablet sent to  [facility] [place], [place] Phone:  [phone]  Fax:  [phone]

## 2020-06-11 NOTE — Telephone Encounter (Signed)
warfarin (COUMADIN) 5 MG tablet   [facility], [place] Phone:  [phone]  Fax:  [phone]     Patient would also like a Rx sent for his shingles vaccine

## 2020-06-11 NOTE — Telephone Encounter (Signed)
Pt is calling in stating that he is out of his Rx warfarin (COMADIN) 5 MG  Pharm:  [facility]

## 2020-06-14 ENCOUNTER — Other Ambulatory Visit: Payer: Self-pay | Admitting: General Practice

## 2020-06-14 DIAGNOSIS — Z7901 Long term (current) use of anticoagulants: Secondary | ICD-10-CM

## 2020-06-14 MED ORDER — WARFARIN SODIUM 5 MG PO TABS: ORAL_TABLET | ORAL | 1 refills | Status: DC

## 2020-06-15 NOTE — Telephone Encounter (Signed)
Left detailed message on machine for [name] to go to the pharmacy for his Shingles Vaccine.

## 2020-06-16 ENCOUNTER — Telehealth: Payer: Self-pay | Admitting: Cardiology

## 2020-06-16 NOTE — Telephone Encounter (Signed)
Returned call to pt he states that yesterday about 3:30 he was watching tv and he started shaking and he heard what sounded like a tractor noise in his head. He did not take his BP(it was 3-4 sec, too quick to get BP) and there were no other sx(no SOB, CP, etc...). He is "fine today and feels great" his BP this morning was 135/78. Will forward to MD...FYI

## 2020-06-16 NOTE — Telephone Encounter (Signed)
Not clear what this event was.  Monitor and let me know if he has more problems. Kirk Ruths

## 2020-06-16 NOTE — Telephone Encounter (Signed)
Pt called in stated he yesterday about 3:30 he was watching tv and he started shaking and he heard what sounded like a tractor noise in his head.  He stated it last for about 3 or 4 seconds.  He wanted to run this by Dr Stanford Breed and see if he needs to come in to be seen.  No symptoms or any now.  He stated everything went back to normal after those 3 or 4 seconds   Best number [phone] or [phone]

## 2020-06-22 ENCOUNTER — Ambulatory Visit: Payer: Medicare Other

## 2020-06-24 ENCOUNTER — Other Ambulatory Visit: Payer: Self-pay

## 2020-06-24 ENCOUNTER — Ambulatory Visit (INDEPENDENT_AMBULATORY_CARE_PROVIDER_SITE_OTHER): Payer: Medicare Other | Admitting: General Practice

## 2020-06-24 DIAGNOSIS — Z7901 Long term (current) use of anticoagulants: Secondary | ICD-10-CM | POA: Diagnosis not present

## 2020-06-24 DIAGNOSIS — I4891 Unspecified atrial fibrillation: Secondary | ICD-10-CM

## 2020-06-24 LAB — POCT INR: INR: 1.5 — AB (ref 2.0–3.0)

## 2020-06-24 NOTE — Patient Instructions (Addendum)
Pre visit review using our clinic review tool, if applicable. No additional management support is needed unless otherwise documented below in the visit note.  Take 1 tablet today and take 1 1/2 tablet tomorrow.  On Saturday change dosage back to 1/2 tablet daily except 1 tablet on Monday Wednesday and Friday.  Re-check in 3 to 4 weeks.

## 2020-06-24 NOTE — Progress Notes (Signed)
Medical screening examination/treatment/procedure(s) were performed by non-physician practitioner and as supervising physician I was immediately available for consultation/collaboration. I agree with above. Wyolene Weimann, MD   

## 2020-07-01 ENCOUNTER — Other Ambulatory Visit: Payer: Self-pay | Admitting: Internal Medicine

## 2020-07-29 ENCOUNTER — Ambulatory Visit (INDEPENDENT_AMBULATORY_CARE_PROVIDER_SITE_OTHER): Payer: Medicare Other | Admitting: General Practice

## 2020-07-29 ENCOUNTER — Other Ambulatory Visit: Payer: Self-pay

## 2020-07-29 DIAGNOSIS — I4891 Unspecified atrial fibrillation: Secondary | ICD-10-CM | POA: Diagnosis not present

## 2020-07-29 DIAGNOSIS — Z7901 Long term (current) use of anticoagulants: Secondary | ICD-10-CM | POA: Diagnosis not present

## 2020-07-29 LAB — POCT INR: INR: 2.6 (ref 2.0–3.0)

## 2020-07-29 NOTE — Patient Instructions (Addendum)
Pre visit review using our clinic review tool, if applicable. No additional management support is needed unless otherwise documented below in the visit note.  Continue to take 1/2 tablet daily except 1 tablet on Monday Wednesday and Friday.  Re-check in 6 weeks.

## 2020-07-29 NOTE — Progress Notes (Signed)
Medical screening examination/treatment/procedure(s) were performed by non-physician practitioner and as supervising physician I was immediately available for consultation/collaboration. I agree with above. Shawnetta Lein, MD   

## 2020-08-05 ENCOUNTER — Other Ambulatory Visit: Payer: Self-pay

## 2020-08-05 ENCOUNTER — Ambulatory Visit (INDEPENDENT_AMBULATORY_CARE_PROVIDER_SITE_OTHER): Payer: Medicare Other | Admitting: Internal Medicine

## 2020-08-05 VITALS — BP 128/60 | HR 81 | Temp 98.1°F | Ht 66.0 in | Wt 175.0 lb

## 2020-08-05 DIAGNOSIS — R042 Hemoptysis: Secondary | ICD-10-CM

## 2020-08-05 DIAGNOSIS — I4891 Unspecified atrial fibrillation: Secondary | ICD-10-CM

## 2020-08-05 DIAGNOSIS — Z7901 Long term (current) use of anticoagulants: Secondary | ICD-10-CM | POA: Diagnosis not present

## 2020-08-05 DIAGNOSIS — I359 Nonrheumatic aortic valve disorder, unspecified: Secondary | ICD-10-CM | POA: Diagnosis not present

## 2020-08-05 NOTE — Patient Instructions (Signed)
-  Nice seeing you today!!  -I recommend immediate evaluation in the emergency department.

## 2020-08-05 NOTE — Progress Notes (Signed)
Acute office Visit     This visit occurred during the SARS-CoV-2 public health emergency.  Safety protocols were in place, including screening questions prior to the visit, additional usage of staff PPE, and extensive cleaning of exam room while observing appropriate contact time as indicated for disinfecting solutions.    CC/Reason for Visit: Hemoptysis  HPI: [name] is a 84 y.o. male who is coming in today for the above mentioned reasons. Past Medical History is significant for: Atrial fibrillation and a bioprosthetic aortic valve who is maintained on chronic anticoagulation with Coumadin.  He tells me that last Thursday he noticed a teaspoon size of blood when coughing mixed with his sputum.  He immediately called the office was given an appointment for today (1 week later).  I was not made aware of this.  He is here today, he is not in respiratory distress.  He has continued to take his Coumadin.  His last INR was 2.6 on December 2.  He continues to have multiple times a day hemoptysis.  He has had actually 3 instances while in the office.  Each time blood is around a teaspoon amount of dark red blood mixed in with sputum.  He has not had any fever, he has not had shortness of breath.  He has not had any recent travel or sick contacts.   Past Medical/Surgical History: Past Medical History:  Diagnosis Date  . Aortic stenosis   . Atrial fibrillation ([place])   . Baker cyst    Left knee  . DJD (degenerative joint disease)    knees  . Dysrhythmia   . Eczema   . Erectile dysfunction   . Gout   . HTN (hypertension)   . MVP (mitral valve prolapse)   . Shortness of breath dyspnea    with activity    Past Surgical History:  Procedure Laterality Date  .  polyps vocal cord  1994  . AORTIC VALVE REPLACEMENT  ~2004  . HERNIA REPAIR  ~1978  . HERNIA REPAIR  ~1983  . LUMBAR LAMINECTOMY/DECOMPRESSION MICRODISCECTOMY N/A 01/15/2015   Procedure: 2 LEVEL DECOMPRESSIVE LUMBAR  LAMINECTOMY L3-L4,L4-L5;  Surgeon: Latanya Maudlin, MD;  Location: [facility];  Service: Orthopedics;  Laterality: N/A;  . LUMBAR LAMINECTOMY/DECOMPRESSION MICRODISCECTOMY N/A 05/01/2017   Procedure: Lumbar one-Lumbar two, Lumbar two-Lumbar three, Lumbar three-Lumbar four Laminectomy; Evacuation of hematoma;  Surgeon: Consuella Lose, MD;  Location: [facility];  Service: Neurosurgery;  Laterality: N/A;  . right total hip  ~4 years ago  . ROTATOR CUFF REPAIR Left ~2009  . TOTAL KNEE ARTHROPLASTY Left 04/14/2015   Procedure: LEFT TOTAL KNEE ARTHROPLASTY;  Surgeon: Latanya Maudlin, MD;  Location: [facility];  Service: Orthopedics;  Laterality: Left;    Social History:  reports that he quit smoking about 17 years ago. His smoking use included cigarettes. He quit after 30.00 years of use. He has never used smokeless tobacco. He reports current alcohol use. He reports that he does not use drugs.  Allergies: No Known Allergies  Family History:  Family History  Problem Relation Age of Onset  . Diabetes Other   . Stroke Other      Current Outpatient Medications:  .  allopurinol (ZYLOPRIM) 300 MG tablet, Take 1 tablet by mouth once daily, Disp: 90 tablet, Rfl: 0 .  amLODipine (NORVASC) 5 MG tablet, Take 1 tablet by mouth once daily, Disp: 90 tablet, Rfl: 0 .  cyanocobalamin (,VITAMIN B-12,) 1000 MCG/ML injection, Inject 55ml in deltoid once  weekly for 4 weeks, then inject 1 ml once a month thereafter, Disp: 6 mL, Rfl: 3 .  furosemide (LASIX) 40 MG tablet, Take 1 tablet by mouth once daily, Disp: 90 tablet, Rfl: 2 .  IRON PO, Take 1 tablet by mouth daily., Disp: , Rfl:  .  predniSONE (DELTASONE) 20 MG tablet, TAKE 1/2 (ONE-HALF) TABLET BY MOUTH ONCE DAILY WITH BREAKFAST, Disp: 45 tablet, Rfl: 0 .  SYRINGE-NEEDLE, DISP, 3 ML (BD SAFETYGLIDE SYRINGE/NEEDLE) 25G X 1" 3 ML MISC, Inject 31ml in deltoid once weekly for 4 weeks, then inject 1 ml once a month thereafter, Disp: 100 each, Rfl: 11 .  tamsulosin (FLOMAX) 0.4  MG CAPS capsule, TAKE 1 CAPSULE BY MOUTH ONCE DAILY AFTER BREAKFAST, Disp: 90 capsule, Rfl: 0 .  traMADol (ULTRAM) 50 MG tablet, TAKE 1/2 (ONE-HALF) TABLET BY MOUTH TWICE DAILY FOR  CHRONIC  PAIN, Disp: 90 tablet, Rfl: 0 .  traZODone (DESYREL) 50 MG tablet, TAKE 1 TABLET BY MOUTH AT BEDTIME AS NEEDED, Disp: 90 tablet, Rfl: 0 .  triamcinolone cream (KENALOG) 0.1 %, CREAM TO AFFECTED AREA TWICE DAILY, Disp: 80 g, Rfl: 3 .  warfarin (COUMADIN) 5 MG tablet, Take 1/2 tablet daily except take 1 tablet on Mon and Friday or Take as directed by anticoagulation clinic, Disp: 75 tablet, Rfl: 1 .  potassium chloride SA (KLOR-CON) 20 MEQ tablet, Take 2 tablets (40 mEq total) by mouth daily for 3 days., Disp: 6 tablet, Rfl: 0  Review of Systems:  Constitutional: Denies fever, chills, diaphoresis, appetite change and fatigue.  HEENT: Denies photophobia, eye pain, redness, hearing loss, ear pain, congestion, sore throat, rhinorrhea, sneezing, mouth sores, trouble swallowing, neck pain, neck stiffness and tinnitus.   Respiratory: Denies SOB, DOE, cough, chest tightness,  and wheezing.   Cardiovascular: Denies chest pain, palpitations and leg swelling.  Gastrointestinal: Denies nausea, vomiting, abdominal pain, diarrhea, constipation, blood in stool and abdominal distention.  Genitourinary: Denies dysuria, urgency, frequency, hematuria, flank pain and difficulty urinating.  Endocrine: Denies: hot or cold intolerance, sweats, changes in hair or nails, polyuria, polydipsia. Musculoskeletal: Denies myalgias, back pain, joint swelling, arthralgias and gait problem.  Skin: Denies pallor, rash and wound.  Neurological: Denies dizziness, seizures, syncope, weakness, light-headedness, numbness and headaches.  Hematological: Denies adenopathy. Easy bruising, personal or family bleeding history  Psychiatric/Behavioral: Denies suicidal ideation, mood changes, confusion, nervousness, sleep disturbance and  agitation    Physical Exam: Vitals:   08/05/20 1328  BP: 128/60  Pulse: 81  Temp: 98.1 F (36.7 C)  TempSrc: Oral  SpO2: 96%  Weight: 175 lb (79.4 kg)  Height: 5\' 6"  (1.676 m)    Body mass index is 28.25 kg/m.   Constitutional: NAD, calm, comfortable Eyes: PERRL, lids and conjunctivae normal, wears corrective lenses ENMT: Mucous membranes are moist.  Respiratory: clear to auscultation bilaterally, no wheezing, no crackles. Normal respiratory effort. No accessory muscle use.  Cardiovascular: Regular rate and rhythm, no murmurs / rubs / gallops. No extremity edema. Neurologic: Grossly intact and nonfocal Psychiatric: Normal judgment and insight. Alert and oriented x 3. Normal mood.    Impression and Plan:  Hemoptysis Atrial fibrillation, unspecified type ([place]) Aortic valve disorder Long term (current) use of anticoagulants  -I have recommended immediate evaluation in the emergency department.  Unfortunately in the outpatient setting I am not able to get imaging and pulmonary consultations as rapidly as he will need them. -He is a prior smoker of a pack a day but quit over 20 years ago. -  Differential diagnosis includes cancer, infection. -He will proceed to ED tonight.    Patient Instructions  -Nice seeing you today!!  -I recommend immediate evaluation in the emergency department.     Lelon Frohlich, MD [facility]

## 2020-08-06 ENCOUNTER — Telehealth: Payer: Self-pay | Admitting: Internal Medicine

## 2020-08-06 ENCOUNTER — Other Ambulatory Visit: Payer: Self-pay

## 2020-08-06 ENCOUNTER — Emergency Department (HOSPITAL_COMMUNITY): Payer: Medicare Other

## 2020-08-06 ENCOUNTER — Emergency Department (HOSPITAL_COMMUNITY)
Admission: EM | Admit: 2020-08-06 | Discharge: 2020-08-06 | Disposition: A | Discharge status: Home / Self Care | Payer: Medicare Other | Attending: Emergency Medicine | Admitting: Emergency Medicine

## 2020-08-06 ENCOUNTER — Encounter (HOSPITAL_COMMUNITY): Payer: Self-pay

## 2020-08-06 ENCOUNTER — Ambulatory Visit: Payer: Self-pay | Admitting: Internal Medicine

## 2020-08-06 DIAGNOSIS — Z7901 Long term (current) use of anticoagulants: Secondary | ICD-10-CM | POA: Insufficient documentation

## 2020-08-06 DIAGNOSIS — I4891 Unspecified atrial fibrillation: Secondary | ICD-10-CM | POA: Diagnosis not present

## 2020-08-06 DIAGNOSIS — J189 Pneumonia, unspecified organism: Secondary | ICD-10-CM | POA: Insufficient documentation

## 2020-08-06 DIAGNOSIS — Z96652 Presence of left artificial knee joint: Secondary | ICD-10-CM | POA: Diagnosis not present

## 2020-08-06 DIAGNOSIS — I517 Cardiomegaly: Secondary | ICD-10-CM | POA: Diagnosis not present

## 2020-08-06 DIAGNOSIS — I110 Hypertensive heart disease with heart failure: Secondary | ICD-10-CM | POA: Diagnosis not present

## 2020-08-06 DIAGNOSIS — Z79899 Other long term (current) drug therapy: Secondary | ICD-10-CM | POA: Diagnosis not present

## 2020-08-06 DIAGNOSIS — I5032 Chronic diastolic (congestive) heart failure: Secondary | ICD-10-CM | POA: Insufficient documentation

## 2020-08-06 DIAGNOSIS — R042 Hemoptysis: Secondary | ICD-10-CM | POA: Diagnosis not present

## 2020-08-06 LAB — CBC WITH DIFFERENTIAL/PLATELET
Abs Immature Granulocytes: 0.02 10*3/uL (ref 0.00–0.07)
Basophils Absolute: 0 10*3/uL (ref 0.0–0.1)
Basophils Relative: 1 %
Eosinophils Absolute: 0.1 10*3/uL (ref 0.0–0.5)
Eosinophils Relative: 1 %
HCT: 41.2 % (ref 39.0–52.0)
Hemoglobin: 13.5 g/dL (ref 13.0–17.0)
Immature Granulocytes: 0 %
Lymphocytes Relative: 8 %
Lymphs Abs: 0.6 10*3/uL — ABNORMAL LOW (ref 0.7–4.0)
MCH: 32.6 pg (ref 26.0–34.0)
MCHC: 32.8 g/dL (ref 30.0–36.0)
MCV: 99.5 fL (ref 80.0–100.0)
Monocytes Absolute: 0.7 10*3/uL (ref 0.1–1.0)
Monocytes Relative: 8 %
Neutro Abs: 6.9 10*3/uL (ref 1.7–7.7)
Neutrophils Relative %: 82 %
Platelets: 181 10*3/uL (ref 150–400)
RBC: 4.14 MIL/uL — ABNORMAL LOW (ref 4.22–5.81)
RDW: 13.7 % (ref 11.5–15.5)
WBC: 8.3 10*3/uL (ref 4.0–10.5)
nRBC: 0 % (ref 0.0–0.2)

## 2020-08-06 LAB — COMPREHENSIVE METABOLIC PANEL
ALT: 15 U/L (ref 0–44)
AST: 20 U/L (ref 15–41)
Albumin: 4.2 g/dL (ref 3.5–5.0)
Alkaline Phosphatase: 57 U/L (ref 38–126)
Anion gap: 4 — ABNORMAL LOW (ref 5–15)
BUN: 22 mg/dL (ref 8–23)
CO2: 33 mmol/L — ABNORMAL HIGH (ref 22–32)
Calcium: 9.2 mg/dL (ref 8.9–10.3)
Chloride: 101 mmol/L (ref 98–111)
Creatinine, Ser: 1.11 mg/dL (ref 0.61–1.24)
GFR, Estimated: >60 mL/min (ref >60)
Glucose, Bld: 97 mg/dL (ref 70–99)
Potassium: 3.7 mmol/L (ref 3.5–5.1)
Sodium: 138 mmol/L (ref 135–145)
Total Bilirubin: 0.9 mg/dL (ref 0.3–1.2)
Total Protein: 7 g/dL (ref 6.5–8.1)

## 2020-08-06 LAB — PROTIME-INR
INR: 1.9 — ABNORMAL HIGH (ref 0.8–1.2)
Prothrombin Time: 20.7 seconds — ABNORMAL HIGH (ref 11.4–15.2)

## 2020-08-06 MED ORDER — AMOXICILLIN-POT CLAVULANATE 875-125 MG PO TABS: 1.0000 | ORAL_TABLET | Freq: Two times a day (BID) | ORAL | 0 refills | Status: DC

## 2020-08-06 NOTE — Discharge Instructions (Signed)
Your chest x-ray shows some evidence of possible early infection I am providing you with antibiotic to cover this.  Please follow-up with pulmonology.  I provided you with information for a pulmonologist here in [place].  Please call to make an appointment. Please continue taking her antibiotic even if your coughing/coughing up blood improves or stops.  Please take for the entire course.  Please take your other prescribed medications. Please continue to not smoke.

## 2020-08-06 NOTE — ED Triage Notes (Addendum)
Pt ambulates to ED from home following visit to PCP yesterday, who instructed pt to visit ED. Pt endorses bloody sputum for 8 days. Denies chest, throat or abd pain. Denies SOB. A/Ox4, ambulatory without assistance. Pt has been on Coumadin since 2004.

## 2020-08-06 NOTE — ED Provider Notes (Signed)
  Face-to-face evaluation   History: Presents for evaluation of bloody sputum with cough for over a week.  He saw his PCP yesterday and was instructed to come to the ED and decided to present for evaluation today.  He states he produces bloody sputum only with coughing.  He denies fever, chills, nausea, vomiting, weakness or dizziness.  No prior similar problems.  He is not a smoker.  No known sick contacts.  Physical exam: Elderly patient alert and cooperative.  Lungs with somewhat decreased air sounds bilaterally but no audible wheezes rales or rhonchi.  There is no increased work of breathing.  He is lucid.  Medical screening examination/treatment/procedure(s) were conducted as a shared visit with non-physician practitioner(s) and myself.  I personally evaluated the patient during the encounter    Daleen Bo, MD 08/07/20 [phone]

## 2020-08-06 NOTE — Telephone Encounter (Signed)
Dr Jerilee Hoh is aware

## 2020-08-06 NOTE — ED Provider Notes (Signed)
[facility] DEPT Provider Note   CSN: [id number] Arrival date & time: 08/06/20  1751     History Chief Complaint  Patient presents with  . Hemoptysis    [name] is a 84 y.o. male.  HPI Patient is an 84 year old male past medical history detailed below notable for MVP, HTN, A. fib on warfarin.  Patient is presented today to the emergency department from primary care office yesterday for symptoms of hemoptysis that has been ongoing for approximately 8/9 days now.  He states that when he wakes in the morning he coughs to clear his throat.  He states that he noticed he was coughing up a small amount of clotted blood in his sputum and then throughout the day approximately 2 or 3 times he would cough up a small amount of streaky red blood in his sputum.  He states that the sputum production was maybe 1 or 2 teaspoons a day and a small percentage of this was blood.  He denies any chest pain, increased coughing, nausea, fevers, vomiting, lightheadedness or dizziness.  He states he has no other associated symptoms.  No aggravating or mitigating factors.  He was seen by his primary care doctor in the office yesterday and was told to go to the ER for further evaluation.  He states he has been taking his warfarin as prescribed with no missed doses or increased doses.    Past Medical History:  Diagnosis Date  . Aortic stenosis   . Atrial fibrillation ([place])   . Baker cyst    Left knee  . DJD (degenerative joint disease)    knees  . Dysrhythmia   . Eczema   . Erectile dysfunction   . Gout   . HTN (hypertension)   . MVP (mitral valve prolapse)   . Shortness of breath dyspnea    with activity    Patient Active Problem List   Diagnosis Date Noted  . Hypokalemia 09/24/2019  . Vitamin D deficiency 09/24/2019  . Vitamin B12 deficiency 09/24/2019  . Chronic diastolic CHF (congestive heart failure) ([place]) 09/17/2018  . Nontraumatic epidural hematoma  ([place]) 05/01/2017  . Routine general medical examination at a health care facility 08/17/2015  . Dyspnea 06/10/2015  . History of total knee arthroplasty 04/14/2015  . Spinal stenosis, lumbar region, with neurogenic claudication 01/15/2015  . Encounter for therapeutic drug monitoring 10/13/2013  . Heart valve replaced by other means 07/03/2013  . Long term (current) use of anticoagulants 07/03/2013  . Urticaria 11/27/2011  . Anxiety 04/04/2011  . CAROTID BRUIT 03/23/2009  . Osteoarthritis 03/22/2009  . FIBRILLATION, ATRIAL 03/16/2009  . Aortic valve disorder 01/21/2008  . 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 Gout 04/17/2007  . Essential hypertension 04/17/2007    Past Surgical History:  Procedure Laterality Date  .  polyps vocal cord  1994  . AORTIC VALVE REPLACEMENT  ~2004  . HERNIA REPAIR  ~1978  . HERNIA REPAIR  ~1983  . LUMBAR LAMINECTOMY/DECOMPRESSION MICRODISCECTOMY N/A 01/15/2015   Procedure: 2 LEVEL DECOMPRESSIVE LUMBAR LAMINECTOMY L3-L4,L4-L5;  Surgeon: Latanya Maudlin, MD;  Location: [facility];  Service: Orthopedics;  Laterality: N/A;  . LUMBAR LAMINECTOMY/DECOMPRESSION MICRODISCECTOMY N/A 05/01/2017   Procedure: Lumbar one-Lumbar two, Lumbar two-Lumbar three, Lumbar three-Lumbar four Laminectomy; Evacuation of hematoma;  Surgeon: Consuella Lose, MD;  Location: [facility];  Service: Neurosurgery;  Laterality: N/A;  . right total hip  ~4 years ago  . ROTATOR CUFF REPAIR Left ~2009  . TOTAL KNEE ARTHROPLASTY Left 04/14/2015  Procedure: LEFT TOTAL KNEE ARTHROPLASTY;  Surgeon: Latanya Maudlin, MD;  Location: [facility];  Service: Orthopedics;  Laterality: Left;       Family History  Problem Relation Age of Onset  . Diabetes Other   . Stroke Other     Social History   Tobacco Use  . Smoking status: Former Smoker    Years: 30.00    Types: Cigarettes    Quit date: 08/28/2002    Years since quitting: 17.9  . Smokeless tobacco: Never Used  Vaping Use  . Vaping Use: Never  used  Substance Use Topics  . Alcohol use: Yes    Comment: social  . Drug use: No    Home Medications Prior to Admission medications   Medication Sig Start Date End Date Taking? Authorizing Provider  allopurinol (ZYLOPRIM) 300 MG tablet Take 1 tablet by mouth once daily Patient taking differently: Take 300 mg by mouth daily. 05/21/20  Yes Isaac Bliss, Rayford Halsted, MD  amLODipine (NORVASC) 5 MG tablet Take 1 tablet by mouth once daily Patient taking differently: Take 5 mg by mouth daily. 05/21/20  Yes Isaac Bliss, Rayford Halsted, MD  Cyanocobalamin (VITAMIN B12 PO) Take 1 tablet by mouth daily.   Yes [provider]  furosemide (LASIX) 40 MG tablet Take 1 tablet by mouth once daily Patient taking differently: Take 40 mg by mouth daily. 02/23/20  Yes Lelon Perla, MD  IRON PO Take 1 tablet by mouth daily.   Yes [provider]  predniSONE (DELTASONE) 20 MG tablet TAKE 1/2 (ONE-HALF) TABLET BY MOUTH ONCE DAILY WITH BREAKFAST Patient taking differently: Take 10 mg by mouth daily with breakfast. 06/03/20  Yes Nafziger, Tommi Rumps, NP  tamsulosin (FLOMAX) 0.4 MG CAPS capsule TAKE 1 CAPSULE BY MOUTH ONCE DAILY AFTER BREAKFAST Patient taking differently: Take 0.4 mg by mouth daily after breakfast. 05/21/20  Yes Isaac Bliss, Rayford Halsted, MD  traMADol (ULTRAM) 50 MG tablet TAKE 1/2 (ONE-HALF) TABLET BY MOUTH TWICE DAILY FOR  CHRONIC  PAIN Patient taking differently: Take 25 mg by mouth 2 (two) times daily. FOR  CHRONIC  PAIN 07/01/20  Yes Isaac Bliss, Rayford Halsted, MD  traZODone (DESYREL) 50 MG tablet TAKE 1 TABLET BY MOUTH AT BEDTIME AS NEEDED Patient taking differently: Take 50 mg by mouth at bedtime as needed for sleep. 06/03/20  Yes Nafziger, Tommi Rumps, NP  triamcinolone cream (KENALOG) 0.1 % CREAM TO AFFECTED AREA TWICE DAILY Patient taking differently: Apply 1 application topically 2 (two) times daily as needed (itching). Around the nose 11/12/18  Yes Isaac Bliss, Rayford Halsted, MD   warfarin (COUMADIN) 5 MG tablet Take 1/2 tablet daily except take 1 tablet on Mon and Friday or Take as directed by anticoagulation clinic Patient taking differently: Take 2.5-5 mg by mouth daily. Take 5mg  on MON WED and FRI.  Take 2.5mg  on TUE THU SAT and SUN. 06/14/20  Yes Isaac Bliss, Rayford Halsted, MD  amoxicillin-clavulanate (AUGMENTIN) 875-125 MG tablet Take 1 tablet by mouth every 12 (twelve) hours. 08/06/20   Tedd Sias, PA  cyanocobalamin (,VITAMIN B-12,) 1000 MCG/ML injection Inject 39ml in deltoid once weekly for 4 weeks, then inject 1 ml once a month thereafter Patient not taking: No sig reported 09/25/19   Isaac Bliss, Rayford Halsted, MD  SYRINGE-NEEDLE, DISP, 3 ML (BD SAFETYGLIDE SYRINGE/NEEDLE) 25G X 1" 3 ML MISC Inject 71ml in deltoid once weekly for 4 weeks, then inject 1 ml once a month thereafter 09/25/19   Isaac Bliss, Rayford Halsted, MD  Allergies    Patient has no known allergies.  Review of Systems   Review of Systems  Constitutional: Negative for chills and fever.  HENT: Negative for congestion.   Respiratory: Negative for shortness of breath.        Chronic cough with new hemoptysis  Cardiovascular: Negative for chest pain.  Gastrointestinal: Negative for abdominal pain.  Musculoskeletal: Negative for neck pain.  All other systems reviewed and are negative.   Physical Exam Updated Vital Signs BP (!) 150/70 (BP Location: Right Arm)   Pulse 74   Temp 97.8 F (36.6 C) (Oral)   Resp 14   Ht 5\' 6"  (1.676 m)   Wt 77.1 kg   SpO2 97%   BMI 27.44 kg/m   Physical Exam Vitals and nursing note reviewed.  Constitutional:      General: He is not in acute distress. HENT:     Head: Normocephalic and atraumatic.     Nose: Nose normal.     Mouth/Throat:     Mouth: Mucous membranes are moist.  Eyes:     General: No scleral icterus. Cardiovascular:     Rate and Rhythm: Normal rate and regular rhythm.     Pulses: Normal pulses.     Heart sounds: Normal heart  sounds.  Pulmonary:     Effort: Pulmonary effort is normal. No respiratory distress.     Breath sounds: Normal breath sounds. No wheezing.     Comments: No active coughing Lungs CAB Voice is hoarse (chronic per pt) Abdominal:     Palpations: Abdomen is soft.     Tenderness: There is no abdominal tenderness. There is no guarding or rebound.  Musculoskeletal:     Cervical back: Normal range of motion.     Right lower leg: No edema.     Left lower leg: No edema.  Skin:    General: Skin is warm and dry.     Capillary Refill: Capillary refill takes less than 2 seconds.  Neurological:     Mental Status: He is alert. Mental status is at baseline.  Psychiatric:        Mood and Affect: Mood normal.        Behavior: Behavior normal.     ED Results / Procedures / Treatments   Labs (all labs ordered are listed, but only abnormal results are displayed) Labs Reviewed  PROTIME-INR - Abnormal; Notable for the following components:      Result Value   Prothrombin Time 20.7 (*)    INR 1.9 (*)    All other components within normal limits  CBC WITH DIFFERENTIAL/PLATELET - Abnormal; Notable for the following components:   RBC 4.14 (*)    Lymphs Abs 0.6 (*)    All other components within normal limits  COMPREHENSIVE METABOLIC PANEL - Abnormal; Notable for the following components:   CO2 33 (*)    Anion gap 4 (*)    All other components within normal limits    EKG None  Radiology DG Chest 2 View  Result Date: 08/06/2020 CLINICAL DATA:  hemoptysis EXAM: CHEST - 2 VIEW COMPARISON:  11/23/2017 and prior FINDINGS: Patchy bibasilar opacities. No pneumothorax or pleural effusion. Stable cardiomegaly and postsurgical appearance of the cardiomediastinal silhouette. Multilevel spondylosis. IMPRESSION: Patchy bibasilar opacities, atelectasis versus infiltrate. Electronically Signed   By: Primitivo Gauze M.D.   On: 08/06/2020 10:44    Procedures Procedures (including critical care  time)  Medications Ordered in ED Medications - No data to display  ED  Course  I have reviewed the triage vital signs and the nursing notes.  Pertinent labs & imaging results that were available during my care of the patient were reviewed by me and considered in my medical decision making (see chart for details).    MDM Rules/Calculators/A&P                          Patient is an 84 year old male presented today with approximately 8 days of hands bright red blood in clear sputum.  He states that he coughs on a daily basis in the morning to clear his throat and over the past 3 days is notices bright red blood.  He states he also has 2-3 episodes of similar looking hemoptysis.  Patient is on warfarin.  He is taking as prescribed.  Reviewed last records.  His last INR was 2.6   CMP with no significant abnormalities.  CBC without leukocytosis or anemia.  INR 1.9 marginally lower than normal.  Chest x-ray with bilateral atelectasis versus infiltrates treat with doxycycline.  Consider Bactrim however this is a poor interaction with warfarin.  I discussed this case with my attending physician who cosigned this note including patient's presenting symptoms, physical exam, and planned diagnostics and interventions. Attending physician stated agreement with plan or made changes to plan which were implemented.   Attending physician assessed patient at bedside.  Suspect hemoptysis may be related to tracheal irritation he does have a significant past medical history of surgery of the vocal cords.  Most recent was years ago however  Local pulmonology if symptoms do not completely resolve with antibiotic therapy.  Doubt PE, bronchiectasis, tumor.  Suspect that this is bronchitis or related to irritation.   Final Clinical Impression(s) / ED Diagnoses Final diagnoses:  Community acquired pneumonia, unspecified laterality    Rx / DC Orders ED Discharge Orders         Ordered     amoxicillin-clavulanate (AUGMENTIN) 875-125 MG tablet  Every 12 hours        08/06/20 1112           Pati Gallo [place], [place] 08/06/20 1558    Daleen Bo, MD 08/07/20 1953

## 2020-08-06 NOTE — Telephone Encounter (Signed)
Patient went to the ED yesterday as recommended and they said that he has Bronchitis and prescribed him amoxicillin-clavulanate (AUGMENTIN) 875-125 MG tablet  He just wants to let Dr. Jerilee Hoh what the doctor said at the ED.

## 2020-08-10 ENCOUNTER — Telehealth: Payer: Self-pay | Admitting: Internal Medicine

## 2020-08-10 NOTE — Telephone Encounter (Signed)
Patient is calling and stated that provider put in a referral to see a pulmonologist but can't get in until 1/14. Pt wanted to see if he provider can mark referral urgent, please advise. CB is [phone] Cell is [phone]

## 2020-08-11 ENCOUNTER — Other Ambulatory Visit: Payer: Self-pay | Admitting: Internal Medicine

## 2020-08-11 NOTE — Telephone Encounter (Signed)
I think that 1/14 is ok.

## 2020-08-11 NOTE — Telephone Encounter (Signed)
Patient states he was referred to a Pulmonalogist because he is spitting up blood.  They can't see him until January 14th.  He was wanting to try to get in earlier because of his condition and is requesting a call back.

## 2020-08-12 MED ORDER — AMOXICILLIN-POT CLAVULANATE 875-125 MG PO TABS: 1.0000 | ORAL_TABLET | Freq: Two times a day (BID) | ORAL | 0 refills | Status: DC

## 2020-08-12 NOTE — Telephone Encounter (Signed)
Patient is aware 

## 2020-08-12 NOTE — Telephone Encounter (Signed)
Patient is calling and is requesting a call back from the nurse , please advise. CB is [phone]

## 2020-08-12 NOTE — Addendum Note (Signed)
Addended by: Westley Hummer B on: 08/12/2020 05:18 PM   Modules accepted: Orders

## 2020-08-12 NOTE — Telephone Encounter (Signed)
amoxicillin-clavulanate (AUGMENTIN) 875-125 MG tablet  Patient is requesting a refill.  He is some better after going to the ED, but requests 1 refill.  Okay to fill?

## 2020-08-19 ENCOUNTER — Telehealth: Payer: Self-pay | Admitting: Internal Medicine

## 2020-08-19 NOTE — Telephone Encounter (Signed)
error 

## 2020-08-25 ENCOUNTER — Other Ambulatory Visit: Payer: Self-pay | Admitting: Internal Medicine

## 2020-09-07 DIAGNOSIS — R3915 Urgency of urination: Secondary | ICD-10-CM | POA: Diagnosis not present

## 2020-09-09 ENCOUNTER — Other Ambulatory Visit: Payer: Self-pay

## 2020-09-09 ENCOUNTER — Ambulatory Visit (INDEPENDENT_AMBULATORY_CARE_PROVIDER_SITE_OTHER): Payer: Medicare Other | Admitting: General Practice

## 2020-09-09 DIAGNOSIS — Z7901 Long term (current) use of anticoagulants: Secondary | ICD-10-CM | POA: Diagnosis not present

## 2020-09-09 LAB — POCT INR: INR: 3.9 — AB (ref 2.0–3.0)

## 2020-09-09 NOTE — Patient Instructions (Signed)
Pre visit review using our clinic review tool, if applicable. No additional management support is needed unless otherwise documented below in the visit note.  Hold dosage today (1/13) and take 1/2 tablet tomorrow (1/14).  On Saturday continue to take 1/2 tablet daily except 1 tablet on Monday Wednesday and Friday.  Re-check in 3 weeks.

## 2020-09-09 NOTE — Progress Notes (Signed)
Medical screening examination/treatment/procedure(s) were performed by non-physician practitioner and as supervising physician I was immediately available for consultation/collaboration. I agree with above. Kasper Mudrick, MD   

## 2020-09-10 ENCOUNTER — Ambulatory Visit (INDEPENDENT_AMBULATORY_CARE_PROVIDER_SITE_OTHER): Payer: Medicare Other | Admitting: Pulmonary Disease

## 2020-09-10 ENCOUNTER — Encounter: Payer: Self-pay | Admitting: Pulmonary Disease

## 2020-09-10 VITALS — BP 124/80 | HR 83 | Temp 98.2°F | Ht 66.0 in | Wt 172.0 lb

## 2020-09-10 DIAGNOSIS — R042 Hemoptysis: Secondary | ICD-10-CM | POA: Diagnosis not present

## 2020-09-10 LAB — BASIC METABOLIC PANEL
BUN: 17 mg/dL (ref 6–23)
CO2: 31 mEq/L (ref 19–32)
Calcium: 9.2 mg/dL (ref 8.4–10.5)
Chloride: 100 mEq/L (ref 96–112)
Creatinine, Ser: 1.06 mg/dL (ref 0.40–1.50)
GFR: 65.24 mL/min (ref >60.00)
Glucose, Bld: 102 mg/dL — ABNORMAL HIGH (ref 70–99)
Potassium: 3.8 mEq/L (ref 3.5–5.1)
Sodium: 138 mEq/L (ref 135–145)

## 2020-09-10 NOTE — H&P (View-Only) (Signed)
 Synopsis: Referred in 08/2020 for hemoptysis   Subjective:   PATIENT ID: [name] Phung GENDER: male DOB: 12/27/1935, MRN: [id number]    HPI  Chief Complaint  Patient presents with  . Consult    Referred by PCP for hemoptysis. Per patient, this has been going on for over 10 weeks. Notices the most amount of blood in his phlegm in the mornings. Denies any chest pain and SOB.    [name] is an 84 year old male, former smoker with aortic valve replacement and atrial fibrillation on coumadin who is referred to pulmonary clinic for hemoptysis.   He has been coughing up blood mixed in with sputum daily since early December. He coughed up a sample for me in clinic today and it was a tablespoon worth of dark blood with sputum (more blood than sputum). He says the cough is new as well. He coughs up more mucous/blood in the mornings. He denies fevers, chills, weight loss, loss of appetite, nausea or vomiting. He was seen by his PCP on 08/05/20 and then sent to the ER. He was prescribed augmentin by the ER team which he denies any improvement in his hemoptysis. Chest radiograph 08/06/20 shows basilar opacities. No masses noted.   He quit smoking 20 years ago. No history of lung cancer in the family. He is from [place] and moved to [place] in [place]. Denies any tuberculosis exposures. 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  He has history of vocal cord lesion s/p surgery many years ago that has left his voice hoarse. He denies any changes in his voice recently.    Past Medical History:  Diagnosis Date  . Aortic stenosis   . Atrial fibrillation (HCC)   . Baker cyst    Left knee  . DJD (degenerative joint disease)    knees  . Dysrhythmia   . Eczema   . Erectile dysfunction   . Gout   . HTN (hypertension)   . MVP (mitral valve prolapse)   . Shortness of breath dyspnea    with activity     Family History  Problem Relation Age of Onset  . Diabetes Other   . Stroke Other       Social History   Socioeconomic History  . Marital status: Divorced    Spouse name: Not on file  . Number of children: Not on file  . Years of education: Not on file  . Highest education level: Not on file  Occupational History  . Not on file  Tobacco Use  . Smoking status: Former Smoker    Years: 30.00    Types: Cigarettes    Quit date: 08/28/2002    Years since quitting: 18.0  . Smokeless tobacco: Never Used  Vaping Use  . Vaping Use: Never used  Substance and Sexual Activity  . Alcohol use: Yes    Comment: social  . Drug use: No  . Sexual activity: Not on file  Other Topics Concern  . Not on file  Social History Narrative  . Not on file   Social Determinants of Health   Financial Resource Strain: Not on file  Food Insecurity: Not on file  Transportation Needs: Not on file  Physical Activity: Not on file  Stress: Not on file  Social Connections: Not on file  Intimate Partner Violence: Not on file     No Known Allergies   Outpatient Medications Prior to Visit  Medication Sig Dispense Refill  . allopurinol (  ZYLOPRIM) 300 MG tablet Take 1 tablet by mouth once daily 90 tablet 1  . amLODipine (NORVASC) 5 MG tablet Take 1 tablet by mouth once daily 90 tablet 1  . cyanocobalamin (,VITAMIN B-12,) 1000 MCG/ML injection Inject 72m in deltoid once weekly for 4 weeks, then inject 1 ml once a month thereafter 6 mL 3  . Cyanocobalamin (VITAMIN B12 PO) Take 1 tablet by mouth daily.    . furosemide (LASIX) 40 MG tablet Take 1 tablet by mouth once daily (Patient taking differently: Take 40 mg by mouth daily.) 90 tablet 2  . IRON PO Take 1 tablet by mouth daily.    . predniSONE (DELTASONE) 20 MG tablet TAKE 1/2 (ONE-HALF) TABLET BY MOUTH ONCE DAILY WITH BREAKFAST (Patient taking differently: Take 10 mg by mouth daily with breakfast.) 45 tablet 0  . SYRINGE-NEEDLE, DISP, 3 ML (BD SAFETYGLIDE SYRINGE/NEEDLE) 25G X 1" 3 ML MISC Inject 152min deltoid once weekly for 4 weeks, then  inject 1 ml once a month thereafter 100 each 11  . tamsulosin (FLOMAX) 0.4 MG CAPS capsule TAKE 1 CAPSULE BY MOUTH ONCE DAILY AFTER BREAKFAST (Patient taking differently: Take 0.4 mg by mouth daily after breakfast.) 90 capsule 0  . traMADol (ULTRAM) 50 MG tablet TAKE 1/2 (ONE-HALF) TABLET BY MOUTH TWICE DAILY FOR  CHRONIC  PAIN (Patient taking differently: Take 25 mg by mouth 2 (two) times daily. FOR  CHRONIC  PAIN) 90 tablet 0  . traZODone (DESYREL) 50 MG tablet TAKE 1 TABLET BY MOUTH AT BEDTIME AS NEEDED (Patient taking differently: Take 50 mg by mouth at bedtime as needed for sleep.) 90 tablet 0  . triamcinolone cream (KENALOG) 0.1 % CREAM TO AFFECTED AREA TWICE DAILY (Patient taking differently: Apply 1 application topically 2 (two) times daily as needed (itching). Around the nose) 80 Francis 3  . warfarin (COUMADIN) 5 MG tablet Take 1/2 tablet daily except take 1 tablet on Mon and Friday or Take as directed by anticoagulation clinic (Patient taking differently: Take 2.5-5 mg by mouth daily. Take 61m42mn MON WED and FRI.  Take 2.61mg30m TUE THU SAT and SUN.) 75 tablet 1  . amoxicillin-clavulanate (AUGMENTIN) 875-125 MG tablet Take 1 tablet by mouth every 12 (twelve) hours. 14 tablet 0   No facility-administered medications prior to visit.    Review of Systems  Constitutional: Negative for chills, fever, malaise/fatigue and weight loss.  HENT: Negative for nosebleeds.   Eyes: Negative.   Respiratory: Positive for cough and hemoptysis. Negative for sputum production, shortness of breath and wheezing.   Cardiovascular: Negative for chest pain, palpitations, orthopnea, claudication, leg swelling and PND.  Gastrointestinal: Negative for abdominal pain, blood in stool, heartburn, nausea and vomiting.  Genitourinary: Negative for hematuria.  Musculoskeletal: Positive for joint pain.  Skin: Negative for rash.  Neurological: Negative for dizziness, weakness and headaches.  Endo/Heme/Allergies: Does not  bruise/bleed easily.  Psychiatric/Behavioral: Negative.    Objective:   Vitals:   09/10/20 1019  BP: 124/80  Pulse: 83  Temp: 98.2 F (36.8 C)  TempSrc: Temporal  SpO2: 97%  Weight: 172 lb (78 kg)  Height: _0  (1.676 m)     Physical Exam Constitutional:      Appearance: Normal appearance. He is normal weight.  HENT:     Head: Normocephalic and atraumatic.     Nose: Nose normal. No congestion or rhinorrhea.     Mouth/Throat:     Mouth: Mucous membranes are moist.     Pharynx: Oropharynx  is clear. No oropharyngeal exudate or posterior oropharyngeal erythema.  Eyes:     General: No scleral icterus.    Conjunctiva/sclera: Conjunctivae normal.     Pupils: Pupils are equal, round, and reactive to light.  Cardiovascular:     Rate and Rhythm: Normal rate. Rhythm irregular.     Pulses: Normal pulses.     Heart sounds: Normal heart sounds.  Pulmonary:     Effort: Pulmonary effort is normal.     Breath sounds: Rales (left base) present. No wheezing or rhonchi.  Abdominal:     General: Bowel sounds are normal.     Palpations: Abdomen is soft.  Musculoskeletal:     Right lower leg: No edema.     Left lower leg: No edema.  Lymphadenopathy:     Cervical: No cervical adenopathy.  Skin:    General: Skin is warm and dry.  Neurological:     General: No focal deficit present.     Mental Status: He is alert.  Psychiatric:        Mood and Affect: Mood normal.        Behavior: Behavior normal.        Thought Content: Thought content normal.        Judgment: Judgment normal.    CBC    Component Value Date/Time   WBC 8.3 08/06/2020 1030   RBC 4.14 (L) 08/06/2020 1030   HGB 13.5 08/06/2020 1030   HGB 13.0 04/02/2020 1019   HCT 41.2 08/06/2020 1030   HCT 38.8 04/02/2020 1019   PLT 181 08/06/2020 1030   PLT 174 04/02/2020 1019   MCV 99.5 08/06/2020 1030   MCV 97 04/02/2020 1019   MCH 32.6 08/06/2020 1030   MCHC 32.8 08/06/2020 1030   RDW 13.7 08/06/2020 1030   RDW 12.6  04/02/2020 1019   LYMPHSABS 0.6 (L) 08/06/2020 1030   LYMPHSABS 1.1 07/09/2017 1229   MONOABS 0.7 08/06/2020 1030   EOSABS 0.1 08/06/2020 1030   EOSABS 0.0 07/09/2017 1229   BASOSABS 0.0 08/06/2020 1030   BASOSABS 0.0 07/09/2017 1229   BMP Latest Ref Rng & Units 08/06/2020 04/02/2020 09/24/2019  Glucose 70 - 99 mg/dL 97 93 94  BUN 8 - 23 mg/dL _0 Creatinine 0.61 - 1.24 mg/dL 1.11 0.99 0.94  BUN/Creat Ratio 10 - 24 - 14 -  Sodium 135 - 145 mmol/L 138 140 140  Potassium 3.5 - 5.1 mmol/L 3.7 3.7 3.3(L)  Chloride 98 - 111 mmol/L 101 100 99  CO2 22 - 32 mmol/L 33(H) 29 35(H)  Calcium 8.9 - 10.3 mg/dL 9.2 9.1 9.4   Chest imaging: CXR 08/06/20 Patchy bibasilar opacities. No pneumothorax or pleural effusion. Stable cardiomegaly and postsurgical appearance of the cardiomediastinal silhouette. Multilevel spondylosis.  CTA Chest 05/2008 Mild emphysematous changes noted in upper lobes.  PFT: No flowsheet data found.  Echo: 2019 - Left ventricle: The cavity size was normal. Wall thickness was  increased in a pattern of mild LVH. Indeterminant diastolic  function (atrial fibrillation). Systolic function was normal. The  estimated ejection fraction was in the range of 55% to 60%. Wall  motion was normal; there were no regional wall motion  abnormalities.  - Aortic valve: Bioprosthetic aortic valve. The bioprosthetic  aortic valve appears to function normally. There was no  significant regurgitation. Mean gradient (S): 13 mm Hg. Valve  area (VTI): 1.37 cm^2.  - Aorta: Status post aortic root replacement.  - Mitral valve: Mildly to moderately calcified annulus.  There was  trivial regurgitation. Valve area by continuity equation (using  LVOT flow): 2.27 cm^2.  - Left atrium: The atrium was moderately to severely dilated.  - Right ventricle: The cavity size was mildly dilated. Systolic  function was mildly reduced.  - Right atrium: The atrium was moderately to  severely dilated.  - Tricuspid valve: Peak RV-RA gradient (S): 33 mm Hg.  - Pulmonary arteries: PA peak pressure: 36 mm Hg (S).   Assessment & Plan:   Hemoptysis - Plan: CT Chest W Contrast, Basic Metabolic Panel (BMET), QuantiFERON-TB Gold Plus, QuantiFERON-TB Gold Plus, Basic Metabolic Panel (BMET), CANCELED: Quantiferon tb gold assay  Discussion: [name] is an 84 year old male, former smoker with aortic valve replacement and atrial fibrillation on coumadin who is referred to pulmonary clinic for hemoptysis.   He has submassive hemoptysis where he is coughing up a tablespoon of blood per day. The hemoptysis did not improve with Augmentin and he is not reporting signs of infection. Differential includes malignancy vs inflammatory condition vs aytpical/indolent infection.     We will obtain a contrasted CT Chest scan as soon as possible. Depending on the results of the CT scan, we may schedule him for flexible bronchosocpy with BAL for airway inspection.   Check BMP and quantiferon gold today.   Will plan future follow up based on workup.   Tamikia Chowning, MD [facility] Office: [phone]   See Amion for Pager Details    Current Outpatient Medications:  .  allopurinol (ZYLOPRIM) 300 MG tablet, Take 1 tablet by mouth once daily, Disp: 90 tablet, Rfl: 1 .  amLODipine (NORVASC) 5 MG tablet, Take 1 tablet by mouth once daily, Disp: 90 tablet, Rfl: 1 .  cyanocobalamin (,VITAMIN B-12,) 1000 MCG/ML injection, Inject 1ml in deltoid once weekly for 4 weeks, then inject 1 ml once a month thereafter, Disp: 6 mL, Rfl: 3 .  Cyanocobalamin (VITAMIN B12 PO), Take 1 tablet by mouth daily., Disp: , Rfl:  .  furosemide (LASIX) 40 MG tablet, Take 1 tablet by mouth once daily (Patient taking differently: Take 40 mg by mouth daily.), Disp: 90 tablet, Rfl: 2 .  IRON PO, Take 1 tablet by mouth daily., Disp: , Rfl:  .  predniSONE (DELTASONE) 20 MG tablet, TAKE 1/2 (ONE-HALF)  TABLET BY MOUTH ONCE DAILY WITH BREAKFAST (Patient taking differently: Take 10 mg by mouth daily with breakfast.), Disp: 45 tablet, Rfl: 0 .  SYRINGE-NEEDLE, DISP, 3 ML (BD SAFETYGLIDE SYRINGE/NEEDLE) 25G X 1" 3 ML MISC, Inject 1ml in deltoid once weekly for 4 weeks, then inject 1 ml once a month thereafter, Disp: 100 each, Rfl: 11 .  tamsulosin (FLOMAX) 0.4 MG CAPS capsule, TAKE 1 CAPSULE BY MOUTH ONCE DAILY AFTER BREAKFAST (Patient taking differently: Take 0.4 mg by mouth daily after breakfast.), Disp: 90 capsule, Rfl: 0 .  traMADol (ULTRAM) 50 MG tablet, TAKE 1/2 (ONE-HALF) TABLET BY MOUTH TWICE DAILY FOR  CHRONIC  PAIN (Patient taking differently: Take 25 mg by mouth 2 (two) times daily. FOR  CHRONIC  PAIN), Disp: 90 tablet, Rfl: 0 .  traZODone (DESYREL) 50 MG tablet, TAKE 1 TABLET BY MOUTH AT BEDTIME AS NEEDED (Patient taking differently: Take 50 mg by mouth at bedtime as needed for sleep.), Disp: 90 tablet, Rfl: 0 .  triamcinolone cream (KENALOG) 0.1 %, CREAM TO AFFECTED AREA TWICE DAILY (Patient taking differently: Apply 1 application topically 2 (two) times daily as needed (itching). Around the nose), Disp: 80 Francis,   Rfl: 3 .  warfarin (COUMADIN) 5 MG tablet, Take 1/2 tablet daily except take 1 tablet on Mon and Friday or Take as directed by anticoagulation clinic (Patient taking differently: Take 2.5-5 mg by mouth daily. Take 80m on MON WED and FRI.  Take 2.560mon TUE THU SAT and SUN.), Disp: 75 tablet, Rfl: 1

## 2020-09-10 NOTE — Progress Notes (Signed)
Synopsis: Referred in 08/2020 for hemoptysis   Subjective:   PATIENT ID: [name]: male DOB: 23-Jul-1936, MRN: [id number]    HPI  Chief Complaint  Patient presents with  . Consult    Referred by PCP for hemoptysis. Per patient, this has been going on for over 10 weeks. Notices the most amount of blood in his phlegm in the mornings. Denies any chest pain and SOB.    Orlander Norwood is an 85 year old male, former smoker with aortic valve replacement and atrial fibrillation on coumadin who is referred to pulmonary clinic for hemoptysis.   He has been coughing up blood mixed in with sputum daily since early December. He coughed up a sample for me in clinic today and it was a tablespoon worth of dark blood with sputum (more blood than sputum). He says the cough is new as well. He coughs up more mucous/blood in the mornings. He denies fevers, chills, weight loss, loss of appetite, nausea or vomiting. He was seen by his PCP on 08/05/20 and then sent to the ER. He was prescribed augmentin by the ER team which he denies any improvement in his hemoptysis. Chest radiograph 08/06/20 shows basilar opacities. No masses noted.   He quit smoking 20 years ago. No history of lung cancer in the family. He is from [place] and moved to the [place] in 1965. Denies any tuberculosis exposures. He is retired from working in a Clinical cyt[employer]ist.  He has history of vocal cord lesion s/p surgery many years ago that has left his voice hoarse. He denies any changes in his voice recently.    Past Medical History:  Diagnosis Date  . Aortic stenosis   . Atrial fibrillation ([place])   . Baker cyst    Left knee  . DJD (degenerative joint disease)    knees  . Dysrhythmia   . Eczema   . Erectile dysfunction   . Gout   . HTN (hypertension)   . MVP (mitral valve prolapse)   . Shortness of breath dyspnea    with activity     Family History  Problem Relation Age of Onset  . Diabetes Other   . Stroke Other       Social History   Socioeconomic History  . Marital status: Divorced    Spouse name: Not on file  . Number of children: Not on file  . Years of education: Not on file  . Highest education level: Not on file  Occupational History  . Not on file  Tobacco Use  . Smoking status: Former Smoker    Years: 30.00    Types: Cigarettes    Quit date: 08/28/2002    Years since quitting: 18.0  . Smokeless tobacco: Never Used  Vaping Use  . Vaping Use: Never used  Substance and Sexual Activity  . Alcohol use: Yes    Comment: social  . Drug use: No  . Sexual activity: Not on file  Other Topics Concern  . Not on file  Social History Narrative  . Not on file   Social Determinants of Health   Financial Resource Strain: Not on file  Food Insecurity: Not on file  Transportation Needs: Not on file  Physical Activity: Not on file  Stress: Not on file  Social Connections: Not on file  Intimate Partner Violence: Not on file     No Known Allergies   Outpatient Medications Prior to Visit  Medication Sig Dispense Refill  . allopurinol (  ZYLOPRIM) 300 MG tablet Take 1 tablet by mouth once daily 90 tablet 1  . amLODipine (NORVASC) 5 MG tablet Take 1 tablet by mouth once daily 90 tablet 1  . cyanocobalamin (,VITAMIN B-12,) 1000 MCG/ML injection Inject 196m in deltoid once weekly for 4 weeks, then inject 1 ml once a month thereafter 6 mL 3  . Cyanocobalamin (VITAMIN B12 PO) Take 1 tablet by mouth daily.    . furosemide (LASIX) 40 MG tablet Take 1 tablet by mouth once daily (Patient taking differently: Take 40 mg by mouth daily.) 90 tablet 2  . IRON PO Take 1 tablet by mouth daily.    . predniSONE (DELTASONE) 20 MG tablet TAKE 1/2 (ONE-HALF) TABLET BY MOUTH ONCE DAILY WITH BREAKFAST (Patient taking differently: Take 10 mg by mouth daily with breakfast.) 45 tablet 0  . SYRINGE-NEEDLE, DISP, 3 ML (BD SAFETYGLIDE SYRINGE/NEEDLE) 25G X 1" 3 ML MISC Inject 168min deltoid once weekly for 4 weeks, then  inject 1 ml once a month thereafter 100 each 11  . tamsulosin (FLOMAX) 0.4 MG CAPS capsule TAKE 1 CAPSULE BY MOUTH ONCE DAILY AFTER BREAKFAST (Patient taking differently: Take 0.4 mg by mouth daily after breakfast.) 90 capsule 0  . traMADol (ULTRAM) 50 MG tablet TAKE 1/2 (ONE-HALF) TABLET BY MOUTH TWICE DAILY FOR  CHRONIC  PAIN (Patient taking differently: Take 25 mg by mouth 2 (two) times daily. FOR  CHRONIC  PAIN) 90 tablet 0  . traZODone (DESYREL) 50 MG tablet TAKE 1 TABLET BY MOUTH AT BEDTIME AS NEEDED (Patient taking differently: Take 50 mg by mouth at bedtime as needed for sleep.) 90 tablet 0  . triamcinolone cream (KENALOG) 0.1 % CREAM TO AFFECTED AREA TWICE DAILY (Patient taking differently: Apply 1 application topically 2 (two) times daily as needed (itching). Around the nose) 80 g 3  . warfarin (COUMADIN) 5 MG tablet Take 1/2 tablet daily except take 1 tablet on Mon and Friday or Take as directed by anticoagulation clinic (Patient taking differently: Take 2.5-5 mg by mouth daily. Take 96m40mn MON WED and FRI.  Take 2.96mg24m TUE THU SAT and SUN.) 75 tablet 1  . amoxicillin-clavulanate (AUGMENTIN) 875-125 MG tablet Take 1 tablet by mouth every 12 (twelve) hours. 14 tablet 0   No facility-administered medications prior to visit.    Review of Systems  Constitutional: Negative for chills, fever, malaise/fatigue and weight loss.  HENT: Negative for nosebleeds.   Eyes: Negative.   Respiratory: Positive for cough and hemoptysis. Negative for sputum production, shortness of breath and wheezing.   Cardiovascular: Negative for chest pain, palpitations, orthopnea, claudication, leg swelling and PND.  Gastrointestinal: Negative for abdominal pain, blood in stool, heartburn, nausea and vomiting.  Genitourinary: Negative for hematuria.  Musculoskeletal: Positive for joint pain.  Skin: Negative for rash.  Neurological: Negative for dizziness, weakness and headaches.  Endo/Heme/Allergies: Does not  bruise/bleed easily.  Psychiatric/Behavioral: Negative.    Objective:   Vitals:   09/10/20 1019  BP: 124/80  Pulse: 83  Temp: 98.2 F (36.8 C)  TempSrc: Temporal  SpO2: 97%  Weight: 172 lb (78 kg)  Height: _0  (1.676 m)     Physical Exam Constitutional:      Appearance: Normal appearance. He is normal weight.  HENT:     Head: Normocephalic and atraumatic.     Nose: Nose normal. No congestion or rhinorrhea.     Mouth/Throat:     Mouth: Mucous membranes are moist.     Pharynx: Oropharynx  is clear. No oropharyngeal exudate or posterior oropharyngeal erythema.  Eyes:     General: No scleral icterus.    Conjunctiva/sclera: Conjunctivae normal.     Pupils: Pupils are equal, round, and reactive to light.  Cardiovascular:     Rate and Rhythm: Normal rate. Rhythm irregular.     Pulses: Normal pulses.     Heart sounds: Normal heart sounds.  Pulmonary:     Effort: Pulmonary effort is normal.     Breath sounds: Rales (left base) present. No wheezing or rhonchi.  Abdominal:     General: Bowel sounds are normal.     Palpations: Abdomen is soft.  Musculoskeletal:     Right lower leg: No edema.     Left lower leg: No edema.  Lymphadenopathy:     Cervical: No cervical adenopathy.  Skin:    General: Skin is warm and dry.  Neurological:     General: No focal deficit present.     Mental Status: He is alert.  Psychiatric:        Mood and Affect: Mood normal.        Behavior: Behavior normal.        Thought Content: Thought content normal.        Judgment: Judgment normal.    CBC    Component Value Date/Time   WBC 8.3 08/06/2020 1030   RBC 4.14 (L) 08/06/2020 1030   HGB 13.5 08/06/2020 1030   HGB 13.0 04/02/2020 1019   HCT 41.2 08/06/2020 1030   HCT 38.8 04/02/2020 1019   PLT 181 08/06/2020 1030   PLT 174 04/02/2020 1019   MCV 99.5 08/06/2020 1030   MCV 97 04/02/2020 1019   MCH 32.6 08/06/2020 1030   MCHC 32.8 08/06/2020 1030   RDW 13.7 08/06/2020 1030   RDW 12.6  04/02/2020 1019   LYMPHSABS 0.6 (L) 08/06/2020 1030   LYMPHSABS 1.1 07/09/2017 1229   MONOABS 0.7 08/06/2020 1030   EOSABS 0.1 08/06/2020 1030   EOSABS 0.0 07/09/2017 1229   BASOSABS 0.0 08/06/2020 1030   BASOSABS 0.0 07/09/2017 1229   BMP Latest Ref Rng & Units 08/06/2020 04/02/2020 09/24/2019  Glucose 70 - 99 mg/dL 97 93 94  BUN 8 - 23 mg/dL _0 Creatinine 0.61 - 1.24 mg/dL 1.11 0.99 0.94  BUN/Creat Ratio 10 - 24 - 14 -  Sodium 135 - 145 mmol/L 138 140 140  Potassium 3.5 - 5.1 mmol/L 3.7 3.7 3.3(L)  Chloride 98 - 111 mmol/L 101 100 99  CO2 22 - 32 mmol/L 33(H) 29 35(H)  Calcium 8.9 - 10.3 mg/dL 9.2 9.1 9.4   Chest imaging: CXR 08/06/20 Patchy bibasilar opacities. No pneumothorax or pleural effusion. Stable cardiomegaly and postsurgical appearance of the cardiomediastinal silhouette. Multilevel spondylosis.  CTA Chest 05/2008 Mild emphysematous changes noted in upper lobes.  PFT: No flowsheet data found.  Echo: 2019 - Left ventricle: The cavity size was normal. Wall thickness was  increased in a pattern of mild LVH. Indeterminant diastolic  function (atrial fibrillation). Systolic function was normal. The  estimated ejection fraction was in the range of 55% to 60%. Wall  motion was normal; there were no regional wall motion  abnormalities.  - Aortic valve: Bioprosthetic aortic valve. The bioprosthetic  aortic valve appears to function normally. There was no  significant regurgitation. Mean gradient (S): 13 mm Hg. Valve  area (VTI): 1.37 cm^2.  - Aorta: Status post aortic root replacement.  - Mitral valve: Mildly to moderately calcified annulus.  There was  trivial regurgitation. Valve area by continuity equation (using  LVOT flow): 2.27 cm^2.  - Left atrium: The atrium was moderately to severely dilated.  - Right ventricle: The cavity size was mildly dilated. Systolic  function was mildly reduced.  - Right atrium: The atrium was moderately to  severely dilated.  - Tricuspid valve: Peak RV-RA gradient (S): 33 mm Hg.  - Pulmonary arteries: PA peak pressure: 36 mm Hg (S).   Assessment & Plan:   Hemoptysis - Plan: CT Chest W Contrast, Basic Metabolic Panel (BMET), QuantiFERON-TB Gold Plus, QuantiFERON-TB Gold Plus, Basic Metabolic Panel (BMET), CANCELED: Quantiferon tb gold assay  Discussion: [name] is an 85 year old male, former smoker with aortic valve replacement and atrial fibrillation on coumadin who is referred to pulmonary clinic for hemoptysis.   He has submassive hemoptysis where he is coughing up a tablespoon of blood per day. The hemoptysis did not improve with Augmentin and he is not reporting signs of infection. Differential includes malignancy vs inflammatory condition vs aytpical/indolent infection.     We will obtain a contrasted CT Chest scan as soon as possible. Depending on the results of the CT scan, we may schedule him for flexible bronchosocpy with BAL for airway inspection.   Check BMP and quantiferon gold today.   Will plan future follow up based on workup.   Freda Jackson, MD [facility] Office: [phone]   See Amion for Pager Details    Current Outpatient Medications:  .  allopurinol (ZYLOPRIM) 300 MG tablet, Take 1 tablet by mouth once daily, Disp: 90 tablet, Rfl: 1 .  amLODipine (NORVASC) 5 MG tablet, Take 1 tablet by mouth once daily, Disp: 90 tablet, Rfl: 1 .  cyanocobalamin (,VITAMIN B-12,) 1000 MCG/ML injection, Inject 57m in deltoid once weekly for 4 weeks, then inject 1 ml once a month thereafter, Disp: 6 mL, Rfl: 3 .  Cyanocobalamin (VITAMIN B12 PO), Take 1 tablet by mouth daily., Disp: , Rfl:  .  furosemide (LASIX) 40 MG tablet, Take 1 tablet by mouth once daily (Patient taking differently: Take 40 mg by mouth daily.), Disp: 90 tablet, Rfl: 2 .  IRON PO, Take 1 tablet by mouth daily., Disp: , Rfl:  .  predniSONE (DELTASONE) 20 MG tablet, TAKE 1/2 (ONE-HALF)  TABLET BY MOUTH ONCE DAILY WITH BREAKFAST (Patient taking differently: Take 10 mg by mouth daily with breakfast.), Disp: 45 tablet, Rfl: 0 .  SYRINGE-NEEDLE, DISP, 3 ML (BD SAFETYGLIDE SYRINGE/NEEDLE) 25G X 1" 3 ML MISC, Inject 12min deltoid once weekly for 4 weeks, then inject 1 ml once a month thereafter, Disp: 100 each, Rfl: 11 .  tamsulosin (FLOMAX) 0.4 MG CAPS capsule, TAKE 1 CAPSULE BY MOUTH ONCE DAILY AFTER BREAKFAST (Patient taking differently: Take 0.4 mg by mouth daily after breakfast.), Disp: 90 capsule, Rfl: 0 .  traMADol (ULTRAM) 50 MG tablet, TAKE 1/2 (ONE-HALF) TABLET BY MOUTH TWICE DAILY FOR  CHRONIC  PAIN (Patient taking differently: Take 25 mg by mouth 2 (two) times daily. FOR  CHRONIC  PAIN), Disp: 90 tablet, Rfl: 0 .  traZODone (DESYREL) 50 MG tablet, TAKE 1 TABLET BY MOUTH AT BEDTIME AS NEEDED (Patient taking differently: Take 50 mg by mouth at bedtime as needed for sleep.), Disp: 90 tablet, Rfl: 0 .  triamcinolone cream (KENALOG) 0.1 %, CREAM TO AFFECTED AREA TWICE DAILY (Patient taking differently: Apply 1 application topically 2 (two) times daily as needed (itching). Around the nose), Disp: 80 g,  Rfl: 3 .  warfarin (COUMADIN) 5 MG tablet, Take 1/2 tablet daily except take 1 tablet on Mon and Friday or Take as directed by anticoagulation clinic (Patient taking differently: Take 2.5-5 mg by mouth daily. Take 48m on MON WED and FRI.  Take 2.571mon TUE THU SAT and SUN.), Disp: 75 tablet, Rfl: 1

## 2020-09-10 NOTE — Patient Instructions (Addendum)
We will check blood work today  We will schedule you for a CT chest with contrast and call you with the results and the next steps. We may need to do a bronchoscopy.

## 2020-09-12 LAB — QUANTIFERON-TB GOLD PLUS
Mitogen-NIL: 2.31 IU/mL
NIL: 0.02 IU/mL
QuantiFERON-TB Gold Plus: NEGATIVE
TB1-NIL: 0 IU/mL
TB2-NIL: <0 IU/mL

## 2020-09-15 ENCOUNTER — Telehealth: Payer: Self-pay | Admitting: Pulmonary Disease

## 2020-09-15 NOTE — Telephone Encounter (Signed)
I agree with Tammy. His TB test is negative. His kidney function and electrolytes are within normal range.   I will call him with the CT Chest results once it is done and will make plans for the next step once the CT is done.  Thanks, Wille Glaser

## 2020-09-15 NOTE — Telephone Encounter (Signed)
Wanted labs results :   Advised Dr. Erin Fulling will look over and send final recs   Preliminary view shows neg M. TB and BMET looks ok .   Cont w/ ov recs and planned CT chest tomorrow

## 2020-09-16 ENCOUNTER — Other Ambulatory Visit: Payer: Self-pay

## 2020-09-16 ENCOUNTER — Encounter (HOSPITAL_BASED_OUTPATIENT_CLINIC_OR_DEPARTMENT_OTHER): Payer: Self-pay

## 2020-09-16 ENCOUNTER — Ambulatory Visit (HOSPITAL_BASED_OUTPATIENT_CLINIC_OR_DEPARTMENT_OTHER)
Admission: RE | Admit: 2020-09-16 | Discharge: 2020-09-16 | Disposition: A | Discharge status: Home / Self Care | Payer: Medicare Other | Source: Ambulatory Visit | Attending: Pulmonary Disease | Admitting: Pulmonary Disease

## 2020-09-16 DIAGNOSIS — I2510 Atherosclerotic heart disease of native coronary artery without angina pectoris: Secondary | ICD-10-CM | POA: Diagnosis not present

## 2020-09-16 DIAGNOSIS — J984 Other disorders of lung: Secondary | ICD-10-CM | POA: Diagnosis not present

## 2020-09-16 DIAGNOSIS — J432 Centrilobular emphysema: Secondary | ICD-10-CM | POA: Diagnosis not present

## 2020-09-16 DIAGNOSIS — J9811 Atelectasis: Secondary | ICD-10-CM | POA: Diagnosis not present

## 2020-09-16 DIAGNOSIS — R042 Hemoptysis: Secondary | ICD-10-CM | POA: Diagnosis not present

## 2020-09-16 MED ORDER — IOHEXOL 300 MG/ML  SOLN
100.0000 mL | Freq: Once | INTRAMUSCULAR | Status: AC | PRN
  Administered 2020-09-16: 80 mL via INTRAVENOUS

## 2020-09-17 ENCOUNTER — Telehealth: Payer: Self-pay | Admitting: Pulmonary Disease

## 2020-09-17 DIAGNOSIS — R042 Hemoptysis: Secondary | ICD-10-CM

## 2020-09-17 NOTE — Telephone Encounter (Signed)
I have spoken with patient on the phone and gave him update about the CT scan results.  We will proceed with bronchoscopy. I have created a separate phone note with the procedure request.   Thanks, Wille Glaser

## 2020-09-17 NOTE — Telephone Encounter (Signed)
Called and spoke with patient. Let them know their Bronch is scheduled for 09/21/20 at 12:30 with Dr. Erin Fulling at [facility].  Patient was instructed to arrive at hospital at 1100am. They were instructed to bring someone with them as they will not be able to drive home from procedure. Patient instructed not to have anything to eat or drink after midnight.   Patient's covid screening is scheduled at [place] location for 09/18/20 at 1030.  Labs placed for pre surgery   Patient voiced understanding, nothing further needed  Routing to [place] as [employer]

## 2020-09-17 NOTE — Telephone Encounter (Signed)
Please schedule the following:   Diagnosis: Hemoptysis Procedure: Flexible Bronchoscopy with BAL Has patient been spoken to by Provider and given informed consent? Yes, spoke to him on the phone today. Anesthesia: Yes, general Do you need Fluro? No Priority: Urgent Date: 09/20/20 Alternate Date: 09/21/20 Time: 12:30 PM Location: [facility] Does patient have OSA? No DM? No Or Latex allergy? No Medication Restriction: No Anticoagulate/Antiplatelet: He is on coumadin, it does NOT need to be held Pre-op Labs Ordered: CBC, CMP, PT/INR, PTT, ANA, ANCA, CCP, Rheumatoid Factor, Procalcitonin,   Please coordinate Pre-op COVID Testing

## 2020-09-17 NOTE — Telephone Encounter (Signed)
Patient is requesting CT chest results. Dr. Erin Fulling please advise, thanks!

## 2020-09-18 ENCOUNTER — Other Ambulatory Visit (HOSPITAL_COMMUNITY)
Admission: RE | Admit: 2020-09-18 | Discharge: 2020-09-18 | Disposition: A | Discharge status: Home / Self Care | Payer: Medicare Other | Source: Ambulatory Visit | Attending: Pulmonary Disease | Admitting: Pulmonary Disease

## 2020-09-18 DIAGNOSIS — Z20822 Contact with and (suspected) exposure to covid-19: Secondary | ICD-10-CM | POA: Diagnosis not present

## 2020-09-18 DIAGNOSIS — Z01812 Encounter for preprocedural laboratory examination: Secondary | ICD-10-CM | POA: Diagnosis not present

## 2020-09-18 LAB — SARS CORONAVIRUS 2 (TAT 6-24 HRS): SARS Coronavirus 2: NEGATIVE

## 2020-09-20 NOTE — Anesthesia Preprocedure Evaluation (Addendum)
Anesthesia Evaluation  Patient identified by MRN, date of birth, ID band Patient awake    Reviewed: Allergy & Precautions, NPO status , Patient's Chart, lab work & pertinent test results  Airway Mallampati: II  TM Distance: >3 FB Neck ROM: Full    Dental no notable dental hx. (+) Teeth Intact, Dental Advisory Given   Pulmonary shortness of breath, former smoker,    Pulmonary exam normal breath sounds clear to auscultation       Cardiovascular Exercise Tolerance: Good hypertension, Pt. on medications negative cardio ROS Normal cardiovascular exam+ dysrhythmias Atrial Fibrillation + Valvular Problems/Murmurs AS  Rhythm:Regular Rate:Normal  4/19 Echo Left ventricle: The cavity size was normal. Wall thickness was  increased in a pattern of mild LVH. Indeterminant diastolic  function (atrial fibrillation). Systolic function was normal. The  estimated ejection fraction was in the range of 55% to 60%. Wall  motion was normal; there were no regional wall motion  abnormalities.  - Aortic valve: Bioprosthetic aortic valve. The bioprosthetic  aortic valve appears to function normally. There was no  significant regurgitation. Mean gradient (S): 13 mm Hg. Valve  area (VTI): 1.37 cm^2.  - Aorta: Status post aortic root replacement.  - Mitral valve: Mildly to moderately calcified annulus. There was  trivial regurgitation. Valve area by continuity equation (using  LVOT flow): 2.27 cm^2.  - Left atrium: The atrium was moderately to severely dilated.  - Right ventricle: The cavity size was mildly dilated. Systolic  function was mildly reduced.  - Right atrium: The atrium was moderately to severely dilated.  - Tricuspid valve: Peak RV-RA gradient (S): 33 mm Hg.  - Pulmonary arteries: PA peak pressure: 36 mm Hg (S).  - Inferior vena cava: The vessel was normal in size. The  respirophasic diameter changes were in the  normal range (>= 50%),  consistent with normal central venous pressure.    Neuro/Psych Anxiety negative neurological ROS     GI/Hepatic negative GI ROS, Neg liver ROS,   Endo/Other  negative endocrine ROS  Renal/GU K+ 3.8 Cr 1.06     Musculoskeletal  (+) Arthritis ,   Abdominal (+) + obese,   Peds  Hematology   Anesthesia Other Findings Pt w hx ovf vocal cord implant on R  Reproductive/Obstetrics INR 3.9                           Anesthesia Physical Anesthesia Plan  ASA: III  Anesthesia Plan: General   Post-op Pain Management:    Induction: Intravenous  PONV Risk Score and Plan: 3 and Treatment may vary due to age or medical condition, Ondansetron and Midazolam  Airway Management Planned: Oral ETT  Additional Equipment: None  Intra-op Plan:   Post-operative Plan: Extubation in OR  Informed Consent: I have reviewed the patients History and Physical, chart, labs and discussed the procedure including the risks, benefits and alternatives for the proposed anesthesia with the patient or authorized representative who has indicated his/her understanding and acceptance.     Dental advisory given  Plan Discussed with: CRNA and Anesthesiologist  Anesthesia Plan Comments:        Anesthesia Quick Evaluation

## 2020-09-21 ENCOUNTER — Other Ambulatory Visit: Payer: Self-pay | Admitting: Internal Medicine

## 2020-09-21 ENCOUNTER — Encounter (HOSPITAL_COMMUNITY): Payer: Self-pay | Admitting: Pulmonary Disease

## 2020-09-21 ENCOUNTER — Ambulatory Visit (HOSPITAL_COMMUNITY): Payer: Medicare Other | Admitting: Anesthesiology

## 2020-09-21 ENCOUNTER — Encounter (HOSPITAL_COMMUNITY)
Admission: RE | Disposition: A | Discharge status: Home / Self Care | Payer: Self-pay | Source: Home / Self Care | Attending: Pulmonary Disease

## 2020-09-21 ENCOUNTER — Other Ambulatory Visit: Payer: Self-pay | Admitting: Adult Health

## 2020-09-21 ENCOUNTER — Ambulatory Visit (HOSPITAL_COMMUNITY)
Admission: RE | Admit: 2020-09-21 | Discharge: 2020-09-21 | Disposition: A | Discharge status: Home / Self Care | Payer: Medicare Other | Attending: Pulmonary Disease | Admitting: Pulmonary Disease

## 2020-09-21 DIAGNOSIS — I4891 Unspecified atrial fibrillation: Secondary | ICD-10-CM | POA: Diagnosis not present

## 2020-09-21 DIAGNOSIS — R042 Hemoptysis: Secondary | ICD-10-CM | POA: Insufficient documentation

## 2020-09-21 DIAGNOSIS — Z7952 Long term (current) use of systemic steroids: Secondary | ICD-10-CM | POA: Diagnosis not present

## 2020-09-21 DIAGNOSIS — Z79899 Other long term (current) drug therapy: Secondary | ICD-10-CM | POA: Insufficient documentation

## 2020-09-21 DIAGNOSIS — Z952 Presence of prosthetic heart valve: Secondary | ICD-10-CM | POA: Insufficient documentation

## 2020-09-21 DIAGNOSIS — M48062 Spinal stenosis, lumbar region with neurogenic claudication: Secondary | ICD-10-CM | POA: Diagnosis not present

## 2020-09-21 DIAGNOSIS — Z79891 Long term (current) use of opiate analgesic: Secondary | ICD-10-CM | POA: Diagnosis not present

## 2020-09-21 DIAGNOSIS — Z7901 Long term (current) use of anticoagulants: Secondary | ICD-10-CM | POA: Insufficient documentation

## 2020-09-21 DIAGNOSIS — R911 Solitary pulmonary nodule: Secondary | ICD-10-CM | POA: Diagnosis not present

## 2020-09-21 DIAGNOSIS — Z87891 Personal history of nicotine dependence: Secondary | ICD-10-CM | POA: Insufficient documentation

## 2020-09-21 DIAGNOSIS — E876 Hypokalemia: Secondary | ICD-10-CM | POA: Diagnosis not present

## 2020-09-21 HISTORY — PX: VIDEO BRONCHOSCOPY: SHX5072

## 2020-09-21 HISTORY — PX: BRONCHIAL WASHINGS: SHX5105

## 2020-09-21 LAB — BODY FLUID CELL COUNT WITH DIFFERENTIAL
Lymphs, Fluid: 91 %
Lymphs, Fluid: 93 %
Monocyte-Macrophage-Serous Fluid: 1 % — ABNORMAL LOW (ref 50–90)
Monocyte-Macrophage-Serous Fluid: 1 % — ABNORMAL LOW (ref 50–90)
Neutrophil Count, Fluid: 6 % (ref 0–25)
Neutrophil Count, Fluid: 8 % (ref 0–25)
Total Nucleated Cell Count, Fluid: 62 cu mm (ref 0–1000)
Total Nucleated Cell Count, Fluid: 78 cu mm (ref 0–1000)

## 2020-09-21 SURGERY — VIDEO BRONCHOSCOPY WITHOUT FLUORO
Anesthesia: General

## 2020-09-21 MED ORDER — FENTANYL CITRATE (PF) 100 MCG/2ML IJ SOLN
INTRAMUSCULAR | Status: AC
  Filled 2020-09-21: qty 2

## 2020-09-21 MED ORDER — SUGAMMADEX SODIUM 200 MG/2ML IV SOLN
INTRAVENOUS | Status: DC | PRN
  Administered 2020-09-21: 150 mg via INTRAVENOUS

## 2020-09-21 MED ORDER — ONDANSETRON HCL 4 MG/2ML IJ SOLN
INTRAMUSCULAR | Status: DC | PRN
  Administered 2020-09-21: 4 mg via INTRAVENOUS

## 2020-09-21 MED ORDER — PROPOFOL 10 MG/ML IV BOLUS
INTRAVENOUS | Status: AC
  Filled 2020-09-21: qty 20

## 2020-09-21 MED ORDER — PROPOFOL 10 MG/ML IV BOLUS
INTRAVENOUS | Status: DC | PRN
  Administered 2020-09-21 (×2): 20 mg via INTRAVENOUS
  Administered 2020-09-21: 120 mg via INTRAVENOUS

## 2020-09-21 MED ORDER — DEXAMETHASONE SODIUM PHOSPHATE 10 MG/ML IJ SOLN
INTRAMUSCULAR | Status: DC | PRN
  Administered 2020-09-21: 5 mg via INTRAVENOUS

## 2020-09-21 MED ORDER — FENTANYL CITRATE (PF) 100 MCG/2ML IJ SOLN
INTRAMUSCULAR | Status: DC | PRN
  Administered 2020-09-21 (×2): 50 ug via INTRAVENOUS

## 2020-09-21 MED ORDER — LACTATED RINGERS IV SOLN: INTRAVENOUS | Status: DC

## 2020-09-21 MED ORDER — LIDOCAINE 2% (20 MG/ML) 5 ML SYRINGE
INTRAMUSCULAR | Status: DC | PRN
  Administered 2020-09-21: 60 mg via INTRAVENOUS

## 2020-09-21 MED ORDER — ROCURONIUM BROMIDE 100 MG/10ML IV SOLN
INTRAVENOUS | Status: DC | PRN
  Administered 2020-09-21: 40 mg via INTRAVENOUS

## 2020-09-21 NOTE — Anesthesia Procedure Notes (Signed)
Procedure Name: Intubation Date/Time: 09/21/2020 1:12 PM Performed by: Gwyndolyn Saxon, CRNA Pre-anesthesia Checklist: Patient identified, Emergency Drugs available, Suction available and Patient being monitored Patient Re-evaluated:Patient Re-evaluated prior to induction Oxygen Delivery Method: Circle system utilized Preoxygenation: Pre-oxygenation with 100% oxygen Induction Type: IV induction Ventilation: Mask ventilation without difficulty and Oral airway inserted - appropriate to patient size Laryngoscope Size: Sabra Heck and 2 Grade View: Grade I Tube type: Oral Tube size: 7.5 mm Number of attempts: 1 Airway Equipment and Method: Patient positioned with wedge pillow and Stylet Placement Confirmation: ETT inserted through vocal cords under direct vision,  positive ETCO2 and breath sounds checked- equal and bilateral Secured at: 24 cm Tube secured with: Tape Dental Injury: Teeth and Oropharynx as per pre-operative assessment

## 2020-09-21 NOTE — Transfer of Care (Signed)
Immediate Anesthesia Transfer of Care Note  Patient: [name] Setting  Procedure(s) Performed: VIDEO BRONCHOSCOPY WITHOUT FLUORO (N/A ) BRONCHIAL WASHINGS  Patient Location: PACU  Anesthesia Type:General  Level of Consciousness: awake, alert  and oriented  Airway & Oxygen Therapy: Patient Spontanous Breathing and Patient connected to face mask oxygen  Post-op Assessment: Report given to RN and Post -op Vital signs reviewed and stable  Post vital signs: Reviewed and stable  Last Vitals:  Vitals Value Taken Time  BP 145/62 09/21/20 1400  Temp 36.4 C 09/21/20 1400  Pulse 37 09/21/20 1403  Resp 17 09/21/20 1403  SpO2 99 % 09/21/20 1403  Vitals shown include unvalidated device data.  Last Pain:  Vitals:   09/21/20 1400  TempSrc: Oral  PainSc: 0-No pain         Complications: No complications documented.

## 2020-09-21 NOTE — Discharge Instructions (Signed)
Flexible Bronchoscopy  Flexible bronchoscopy is a procedure used to examine the passageways in the lungs. During the procedure, a thin, flexible tool with a camera (bronchoscope) is passed into the mouth or nose, down through the windpipe (trachea), and into the air tubes in the lungs (bronchi). This tool allows the health care provider to look inside the lungs and to take samples for testing, if needed. Tell a health care provider about:  Any allergies you have.  All medicines you are taking, including vitamins, herbs, eye drops, creams, and over-the-counter medicines.  Any problems you or family members have had with anesthetic medicines.  Any blood disorders you have.  Any surgeries you have had.  Any medical conditions you have.  Whether you are pregnant or may be pregnant. What are the risks? Generally, this is a safe procedure. However, problems may occur, including:  Infection.  Bleeding.  Damage to other structures or organs.  Allergic reactions to medicines.  Collapsed lung (pneumothorax).  Increased need for oxygen or difficulty breathing after the procedure. What happens before the procedure? Staying hydrated Follow instructions from your health care provider about hydration, which may include:  Up to 2 hours before the procedure - you may continue to drink clear liquids, such as water, clear fruit juice, black coffee, and plain tea.   Eating and drinking restrictions Follow instructions from your health care provider about eating and drinking, which may include:  8 hours before the procedure - stop eating heavy meals or foods, such as meat, fried foods, or fatty foods.  6 hours before the procedure - stop eating light meals or foods, such as toast or cereal.  6 hours before the procedure - stop drinking milk or drinks that contain milk.  2 hours before the procedure - stop drinking clear liquids. Medicines Ask your health care provider about:  Changing or  stopping your regular medicines. This is especially important if you are taking diabetes medicines or blood thinners.  Taking medicines such as aspirin and ibuprofen. These medicines can thin your blood. Do not take these medicines unless your health care provider tells you to take them.  Taking over-the-counter medicines, vitamins, herbs, and supplements. General instructions  You may be given antibiotic medicine to help lower the risk of infection.  Plan to have a responsible adult take you home from the hospital or clinic.  If you will be going home right after the procedure, plan to have a responsible adult care for you for the time you are told. This is important. What happens during the procedure?  An IV will be inserted into one of your veins.  You will be given a medicine (local anesthetic) to numb your mouth, nose, throat, and voice box (larynx). You may also be given one or more of the following: ? A medicine to help you relax (sedative). ? A medicine to control coughing. ? A medicine to dry up any fluids or secretions in your lungs.  A bronchoscope will be passed into your nose or mouth, and into your lungs. Your health care provider will examine your lungs.  Samples of airway secretions may be collected for testing.  If abnormal areas are seen in your airways, samples of tissue may be removed and checked under a microscope (biopsy).  If tissue samples are needed from the outer parts of the lung, a type of X-ray (fluoroscopy) may be used to guide the bronchoscope to these areas.  If bleeding occurs, you may be given medicine to  stop or decrease the bleeding. The procedure may vary among health care providers and hospitals. What can I expect after the procedure?  Your blood pressure, heart rate, breathing rate, and blood oxygen level will be monitored until you leave the hospital or clinic.  You may have a chest X-ray to check for signs of pneumothorax.  You willnot be  allowed to eat or drink anything for 2 hours after your procedure.  If a biopsy was taken, it is up to you to get the results of the test. Ask your health care provider, or the department that is doing the procedure, when your results will be ready.  You may have the following symptoms for 24-48 hours: ? A cough that is worse than it was before the procedure. ? A low-grade fever. ? A sore throat or hoarse voice. ? Some blood in the mucus from your lungs (sputum), if a biopsy was done. Follow these instructions at home: Eating and drinking  Do not eat or drink anything, including water, for 2 hours after your procedure, or until your numbing medicine has worn off. Having a numb throat increases your risk of burning yourself or choking.  Start eating soft foods and slowly drinking liquids after your numbness is gone and your cough and gag reflexes have returned.  You may return to your normal diet the day after the procedure. Driving  If you were given a sedative during the procedure, it can affect you for several hours. Do not drive or operate machinery until your health care provider says that it is safe.  Ask your health care provider if the medicine prescribed to you requires you to avoid driving or using machinery.  Return to your normal activities as told by your health care provider. Ask your health care provider what activities are safe for you. General instructions  Take over-the-counter and prescription medicines only as told by your health care provider.  Do not use any products that contain nicotine or tobacco. These products include cigarettes, chewing tobacco, and vaping devices, such as e-cigarettes. If you need help quitting, ask your health care provider.  Keep all follow-up visits. This is important.   Get help right away if:  You have shortness of breath that gets worse.  You become light-headed or feel like you might faint.  You have chest pain.  You cough up  more than a small amount of blood. These symptoms may represent a serious problem that is an emergency. Do not wait to see if the symptoms will go away. Get medical help right away. Call your local emergency services (911 in the [place] Do not drive yourself to the hospital. Summary  Flexible bronchoscopy is a procedure that allows your health care provider to look closely inside your lungs and to take testing samples if needed.  Risks of flexible bronchoscopy include bleeding, infection, and collapsed lung (pneumothorax).  Before the procedure, you will be given a medicine to numb your mouth, nose, throat, and voice box. Then, a bronchoscope will be passed into your nose or mouth, and into your lungs.  After the procedure, your blood pressure, heart rate, breathing rate, and blood oxygen level will be monitored until you leave the hospital or clinic. You may have a chest X-ray to check for signs of pneumothorax.  You will not be allowed to eat or drink anything for 2 hours after your procedure. This information is not intended to replace advice given to you by your health care provider.   Make sure you discuss any questions you have with your health care provider. Document Revised: 03/04/2020 Document Reviewed: 03/04/2020 Elsevier Patient Education  [place].

## 2020-09-21 NOTE — Op Note (Signed)
Bronchoscopy Procedure Note  [name]  [id number]  [birth year]  Date:09/21/20  Time:1:52 PM   Provider Performing:Athea Haley B Barbarann Kelly   Procedure(s):  Flexible bronchoscopy with bronchial alveolar lavage (30092)  Indication(s) Hemoptysis  Consent Risks of the procedure as well as the alternatives and risks of each were explained to the patient and/or caregiver.  Consent for the procedure was obtained and is signed in the bedside chart  Anesthesia General   Time Out Verified patient identification, verified procedure, site/side was marked, verified correct patient position, special equipment/implants available, medications/allergies/relevant history reviewed, required imaging and test results available.   Sterile Technique Usual hand hygiene, masks, gowns, and gloves were used   Procedure Description Bronchoscope advanced through endotracheal tube and into airway.  Airways were examined down to subsegmental level with findings noted below.   Following diagnostic evaluation, BAL(s) performed in RLL and LLL with normal saline and return of 25cc and 50cc of fluid respectively.  Findings:  - Dark bloody mucous noted in the trachea which was suctioned to clarity.  - Bloody secretions noted in the entrance to the left lower lobe which were suctioned to clarity - BAL was performed in the RLL and LLL - No abnormal mucosa noted throughout the trachea or right and left bronchial trees.  - No endobronchial lesions noted  Complications/Tolerance None; patient tolerated the procedure well. Chest X-ray is not needed post procedure.   EBL Minimal   Specimen(s) RLL BAL for cytology, cell block, respiratory culture, fungal culture, AFB culture and PJP smear LLL BAL for cytology, respiratory culture, fungal culture, AFB culture and PJP smear

## 2020-09-22 ENCOUNTER — Encounter (HOSPITAL_COMMUNITY): Payer: Self-pay | Admitting: Pulmonary Disease

## 2020-09-22 LAB — ACID FAST SMEAR (AFB, MYCOBACTERIA)
Acid Fast Smear: NEGATIVE
Acid Fast Smear: NEGATIVE

## 2020-09-22 LAB — PNEUMOCYSTIS JIROVECI SMEAR BY DFA: Pneumocystis jiroveci Ag: NEGATIVE

## 2020-09-22 NOTE — Anesthesia Postprocedure Evaluation (Signed)
Anesthesia Post Note  Patient: [name]  Procedure(s) Performed: VIDEO BRONCHOSCOPY WITHOUT FLUORO (N/A ) BRONCHIAL WASHINGS     Patient location during evaluation: PACU Anesthesia Type: General Level of consciousness: awake and alert Pain management: pain level controlled Vital Signs Assessment: post-procedure vital signs reviewed and stable Respiratory status: spontaneous breathing, nonlabored ventilation, respiratory function stable and patient connected to nasal cannula oxygen Cardiovascular status: blood pressure returned to baseline and stable Postop Assessment: no apparent nausea or vomiting Anesthetic complications: no   No complications documented.  Last Vitals:  Vitals:   09/21/20 1440 09/21/20 1450  BP: (!) 203/65 (!) 192/76  Pulse: (!) 56 72  Resp: 16 16  Temp:    SpO2: 94% 90%    Last Pain:  Vitals:   09/21/20 1450  TempSrc:   PainSc: 0-No pain                 Barnet Glasgow

## 2020-09-23 ENCOUNTER — Telehealth: Payer: Self-pay | Admitting: Pulmonary Disease

## 2020-09-23 LAB — CYTOLOGY - NON PAP

## 2020-09-23 NOTE — Telephone Encounter (Signed)
Spoke with pt and asked if he would be available to come to office to have labs drawn. Pt states he would come in on either today (Thursday) or tomorrow (Friday) to have labs drawn. Pt was instructed that he would not need appointment and just needed to check in at front desk. Pt stated understanding. Unwanted labs were canceled per Dr. August Albino request. Nothing further needed at this time.

## 2020-09-23 NOTE — Addendum Note (Signed)
Addended by: Gavin Potters R on: 09/23/2020 09:19 AM   Modules accepted: Orders

## 2020-09-23 NOTE — Telephone Encounter (Signed)
Please call the patient to come in to have labs drawn at his convenience either today or tomorrow. These were not done before his bronchoscopy earlier this week. He only needs to have the ANCA screen, ANA w/reflex, CCP and RF labs drawn. The other labs that were ordered in the pre-op bronch scheduling can be cancelled.   Thank you, Wille Glaser

## 2020-09-24 ENCOUNTER — Other Ambulatory Visit: Payer: Self-pay | Admitting: Pulmonary Disease

## 2020-09-24 ENCOUNTER — Other Ambulatory Visit: Payer: Medicare Other

## 2020-09-24 DIAGNOSIS — R042 Hemoptysis: Secondary | ICD-10-CM

## 2020-09-24 LAB — CULTURE, RESPIRATORY W GRAM STAIN
Culture: NORMAL
Culture: NORMAL
Special Requests: NORMAL
Special Requests: NORMAL

## 2020-09-25 NOTE — Interval H&P Note (Signed)
History and Physical Interval Note:  09/25/2020 3:28 PM  [name]  has presented today for surgery, with the diagnosis of HEMOPTYSIS.  The various methods of treatment have been discussed with the patient and family. After consideration of risks, benefits and other options for treatment, the patient has consented to  Procedure(s) with comments: VIDEO BRONCHOSCOPY WITHOUT FLUORO (N/A) BRONCHIAL WASHINGS - BAL  as a surgical intervention.  The patient's history has been reviewed, patient examined, no change in status, stable for surgery.  I have reviewed the patient's chart and labs.  Questions were answered to the patient's satisfaction.     Freddi Starr

## 2020-09-26 LAB — ANA W/REFLEX: Anti Nuclear Antibody (ANA): NEGATIVE

## 2020-09-27 LAB — ANCA SCREEN W REFLEX TITER: ANCA Screen: NEGATIVE

## 2020-09-27 LAB — CYCLIC CITRUL PEPTIDE ANTIBODY, IGG: Cyclic Citrullin Peptide Ab: <16 UNITS

## 2020-09-27 LAB — RHEUMATOID FACTOR: Rheumatoid fact SerPl-aCnc: 57 IU/mL — ABNORMAL HIGH (ref <14)

## 2020-09-28 DIAGNOSIS — Z87891 Personal history of nicotine dependence: Secondary | ICD-10-CM | POA: Diagnosis not present

## 2020-09-28 DIAGNOSIS — J381 Polyp of vocal cord and larynx: Secondary | ICD-10-CM | POA: Diagnosis not present

## 2020-09-28 DIAGNOSIS — R042 Hemoptysis: Secondary | ICD-10-CM | POA: Diagnosis not present

## 2020-09-28 DIAGNOSIS — R490 Dysphonia: Secondary | ICD-10-CM | POA: Diagnosis not present

## 2020-09-28 DIAGNOSIS — J384 Edema of larynx: Secondary | ICD-10-CM | POA: Diagnosis not present

## 2020-09-28 DIAGNOSIS — J383 Other diseases of vocal cords: Secondary | ICD-10-CM | POA: Diagnosis not present

## 2020-09-28 DIAGNOSIS — Z7901 Long term (current) use of anticoagulants: Secondary | ICD-10-CM | POA: Diagnosis not present

## 2020-09-30 ENCOUNTER — Other Ambulatory Visit: Payer: Self-pay

## 2020-09-30 ENCOUNTER — Ambulatory Visit (INDEPENDENT_AMBULATORY_CARE_PROVIDER_SITE_OTHER): Payer: Medicare Other | Admitting: General Practice

## 2020-09-30 DIAGNOSIS — I4891 Unspecified atrial fibrillation: Secondary | ICD-10-CM

## 2020-09-30 DIAGNOSIS — Z7901 Long term (current) use of anticoagulants: Secondary | ICD-10-CM | POA: Diagnosis not present

## 2020-09-30 LAB — POCT INR: INR: 2.7 (ref 2.0–3.0)

## 2020-09-30 NOTE — Patient Instructions (Addendum)
Pre visit review using our clinic review tool, if applicable. No additional management support is needed unless otherwise documented below in the visit note.  Continue to take 1/2 tablet daily except 1 tablet on Monday Wednesday and Friday.  Re-check in 6 weeks.

## 2020-09-30 NOTE — Progress Notes (Signed)
Medical screening examination/treatment/procedure(s) were performed by non-physician practitioner and as supervising physician I was immediately available for consultation/collaboration. I agree with above. Therma Lasure, MD   

## 2020-10-04 ENCOUNTER — Telehealth: Payer: Self-pay | Admitting: Pulmonary Disease

## 2020-10-04 NOTE — Telephone Encounter (Signed)
ATC patient. Someone answered but the phone went dead. Called back and it went to VM. Will attempt to call back later.

## 2020-10-06 NOTE — Telephone Encounter (Signed)
Spoke with patient. He stated that [name] had already called and spoke with him.   Nothing further needed at time of call.

## 2020-10-19 ENCOUNTER — Encounter: Payer: Medicare Other | Admitting: Internal Medicine

## 2020-10-19 LAB — FUNGUS CULTURE WITH STAIN

## 2020-10-19 LAB — FUNGUS CULTURE RESULT

## 2020-10-19 LAB — FUNGAL ORGANISM REFLEX

## 2020-10-20 ENCOUNTER — Other Ambulatory Visit: Payer: Self-pay

## 2020-10-21 ENCOUNTER — Ambulatory Visit (INDEPENDENT_AMBULATORY_CARE_PROVIDER_SITE_OTHER): Payer: Medicare Other | Admitting: Internal Medicine

## 2020-10-21 ENCOUNTER — Other Ambulatory Visit: Payer: Self-pay

## 2020-10-21 ENCOUNTER — Encounter: Payer: Self-pay | Admitting: Internal Medicine

## 2020-10-21 VITALS — BP 118/70 | HR 64 | Temp 98.0°F | Ht 65.5 in | Wt 172.8 lb

## 2020-10-21 DIAGNOSIS — I4891 Unspecified atrial fibrillation: Secondary | ICD-10-CM | POA: Diagnosis not present

## 2020-10-21 DIAGNOSIS — I5032 Chronic diastolic (congestive) heart failure: Secondary | ICD-10-CM | POA: Diagnosis not present

## 2020-10-21 DIAGNOSIS — E559 Vitamin D deficiency, unspecified: Secondary | ICD-10-CM | POA: Diagnosis not present

## 2020-10-21 DIAGNOSIS — R042 Hemoptysis: Secondary | ICD-10-CM

## 2020-10-21 DIAGNOSIS — Z952 Presence of prosthetic heart valve: Secondary | ICD-10-CM | POA: Diagnosis not present

## 2020-10-21 DIAGNOSIS — I10 Essential (primary) hypertension: Secondary | ICD-10-CM

## 2020-10-21 DIAGNOSIS — G4700 Insomnia, unspecified: Secondary | ICD-10-CM | POA: Diagnosis not present

## 2020-10-21 DIAGNOSIS — E538 Deficiency of other specified B group vitamins: Secondary | ICD-10-CM | POA: Diagnosis not present

## 2020-10-21 DIAGNOSIS — Z0000 Encounter for general adult medical examination without abnormal findings: Secondary | ICD-10-CM

## 2020-10-21 LAB — CBC WITH DIFFERENTIAL/PLATELET
Basophils Absolute: 0.1 10*3/uL (ref 0.0–0.1)
Basophils Relative: 0.8 % (ref 0.0–3.0)
Eosinophils Absolute: 0.1 10*3/uL (ref 0.0–0.7)
Eosinophils Relative: 0.7 % (ref 0.0–5.0)
HCT: 40.3 % (ref 39.0–52.0)
Hemoglobin: 13.5 g/dL (ref 13.0–17.0)
Lymphocytes Relative: 7.3 % — ABNORMAL LOW (ref 12.0–46.0)
Lymphs Abs: 0.6 10*3/uL — ABNORMAL LOW (ref 0.7–4.0)
MCHC: 33.6 g/dL (ref 30.0–36.0)
MCV: 96.6 fl (ref 78.0–100.0)
Monocytes Absolute: 0.5 10*3/uL (ref 0.1–1.0)
Monocytes Relative: 6.3 % (ref 3.0–12.0)
Neutro Abs: 7 10*3/uL (ref 1.4–7.7)
Neutrophils Relative %: 84.9 % — ABNORMAL HIGH (ref 43.0–77.0)
Platelets: 215 10*3/uL (ref 150.0–400.0)
RBC: 4.17 Mil/uL — ABNORMAL LOW (ref 4.22–5.81)
RDW: 14.6 % (ref 11.5–15.5)
WBC: 8.3 10*3/uL (ref 4.0–10.5)

## 2020-10-21 LAB — COMPREHENSIVE METABOLIC PANEL
ALT: 12 U/L (ref 0–53)
AST: 19 U/L (ref 0–37)
Albumin: 4 g/dL (ref 3.5–5.2)
Alkaline Phosphatase: 61 U/L (ref 39–117)
BUN: 18 mg/dL (ref 6–23)
CO2: 32 mEq/L (ref 19–32)
Calcium: 9.1 mg/dL (ref 8.4–10.5)
Chloride: 100 mEq/L (ref 96–112)
Creatinine, Ser: 1.08 mg/dL (ref 0.40–1.50)
GFR: 63.74 mL/min (ref >60.00)
Glucose, Bld: 90 mg/dL (ref 70–99)
Potassium: 3.4 mEq/L — ABNORMAL LOW (ref 3.5–5.1)
Sodium: 140 mEq/L (ref 135–145)
Total Bilirubin: 0.8 mg/dL (ref 0.2–1.2)
Total Protein: 6.7 g/dL (ref 6.0–8.3)

## 2020-10-21 LAB — LIPID PANEL
Cholesterol: 220 mg/dL — ABNORMAL HIGH (ref 0–200)
HDL: 57.8 mg/dL (ref >39.00)
LDL Cholesterol: 140 mg/dL — ABNORMAL HIGH (ref 0–99)
NonHDL: 161.78
Total CHOL/HDL Ratio: 4
Triglycerides: 108 mg/dL (ref 0.0–149.0)
VLDL: 21.6 mg/dL (ref 0.0–40.0)

## 2020-10-21 LAB — VITAMIN B12: Vitamin B-12: 234 pg/mL (ref 211–911)

## 2020-10-21 LAB — HEMOGLOBIN A1C: Hgb A1c MFr Bld: 5.3 % (ref 4.6–6.5)

## 2020-10-21 LAB — VITAMIN D 25 HYDROXY (VIT D DEFICIENCY, FRACTURES): VITD: 41.98 ng/mL (ref 30.00–100.00)

## 2020-10-21 LAB — TSH: TSH: 0.51 u[IU]/mL (ref 0.35–4.50)

## 2020-10-21 MED ORDER — TRAZODONE HCL 50 MG PO TABS: 50.0000 mg | ORAL_TABLET | Freq: Every evening | ORAL | 1 refills | Status: DC | PRN

## 2020-10-21 NOTE — Progress Notes (Signed)
Established Patient Office Visit     This visit occurred during the SARS-CoV-2 public health emergency.  Safety protocols were in place, including screening questions prior to the visit, additional usage of staff PPE, and extensive cleaning of exam room while observing appropriate contact time as indicated for disinfecting solutions.    CC/Reason for Visit: Annual preventive exam and subsequent Medicare wellness visit  HPI: [name] is a 85 y.o. male who is coming in today for the above mentioned reasons. Past Medical History is significant for: Hypertension, atrial fibrillation, aortic valve replacement on Coumadin, BPH, chronic low back pain and osteoarthritis of the hands.  I saw him for an acute visit in December for hemoptysis.  He went to the ED was given a course of Augmentin which made no difference.  He then went to pulmonary, he had a bronchoscopy towards the end of January that was negative for malignant cells also for atypical infections.  He tells me that he was sent back to his ENT out of [facility] who did his initial vocal cord surgery.  They wonder whether he may need to be taken off Coumadin.  He has a follow-up visit with his ENT mid-March.  Pending results from that consultation we may have to involve cardiology for decisions regarding his anticoagulation.  He has atrial fibrillation as well as a bioprosthetic aortic valve replacement.  He has routine eye and dental care.  He exercises by walking 3 miles about 3 times a week.  All immunizations are up-to-date with the exception of Tdap.  He no longer pursues cancer screening due to age.  He feels well and has no concerns.  Continues to have about a tablespoon amount of hemoptysis per day.   Past Medical/Surgical History: Past Medical History:  Diagnosis Date  . Aortic stenosis   . Atrial fibrillation ([place])   . Baker cyst    Left knee  . DJD (degenerative joint disease)    knees  . Dysrhythmia   . Eczema   .  Erectile dysfunction   . Gout   . HTN (hypertension)   . MVP (mitral valve prolapse)   . Shortness of breath dyspnea    with activity    Past Surgical History:  Procedure Laterality Date  .  polyps vocal cord  1994  . AORTIC VALVE REPLACEMENT  ~2004  . BRONCHIAL WASHINGS  09/21/2020   Procedure: BRONCHIAL WASHINGS;  Surgeon: Freddi Starr, MD;  Location: [facility];  Service: Pulmonary;;  BAL   . HERNIA REPAIR  ~1978  . HERNIA REPAIR  ~1983  . LUMBAR LAMINECTOMY/DECOMPRESSION MICRODISCECTOMY N/A 01/15/2015   Procedure: 2 LEVEL DECOMPRESSIVE LUMBAR LAMINECTOMY L3-L4,L4-L5;  Surgeon: Latanya Maudlin, MD;  Location: [facility];  Service: Orthopedics;  Laterality: N/A;  . LUMBAR LAMINECTOMY/DECOMPRESSION MICRODISCECTOMY N/A 05/01/2017   Procedure: Lumbar one-Lumbar two, Lumbar two-Lumbar three, Lumbar three-Lumbar four Laminectomy; Evacuation of hematoma;  Surgeon: Consuella Lose, MD;  Location: [facility];  Service: Neurosurgery;  Laterality: N/A;  . right total hip  ~4 years ago  . ROTATOR CUFF REPAIR Left ~2009  . TOTAL KNEE ARTHROPLASTY Left 04/14/2015   Procedure: LEFT TOTAL KNEE ARTHROPLASTY;  Surgeon: Latanya Maudlin, MD;  Location: [facility];  Service: Orthopedics;  Laterality: Left;  Marland Kitchen VIDEO BRONCHOSCOPY N/A 09/21/2020   Procedure: VIDEO BRONCHOSCOPY WITHOUT FLUORO;  Surgeon: Freddi Starr, MD;  Location: [facility];  Service: Pulmonary;  Laterality: N/A;    Social History:  reports that he quit smoking  about 18 years ago. His smoking use included cigarettes. He quit after 30.00 years of use. He has never used smokeless tobacco. He reports current alcohol use. He reports that he does not use drugs.  Allergies: No Known Allergies  Family History:  Family History  Problem Relation Age of Onset  . Diabetes Other   . Stroke Other      Current Outpatient Medications:  .  allopurinol (ZYLOPRIM) 300 MG tablet, Take 1 tablet by mouth once daily (Patient taking differently: Take  300 mg by mouth daily.), Disp: 90 tablet, Rfl: 1 .  amLODipine (NORVASC) 5 MG tablet, Take 1 tablet by mouth once daily (Patient taking differently: Take 5 mg by mouth daily.), Disp: 90 tablet, Rfl: 1 .  Cholecalciferol (EQL VITAMIN D3) 50 MCG (2000 UT) CAPS, Take 2,000 Units by mouth daily., Disp: , Rfl:  .  ferrous sulfate 325 (65 FE) MG tablet, Take 325 mg by mouth daily with breakfast., Disp: , Rfl:  .  furosemide (LASIX) 40 MG tablet, Take 1 tablet by mouth once daily (Patient taking differently: Take 40 mg by mouth daily.), Disp: 90 tablet, Rfl: 2 .  ibuprofen (ADVIL) 100 MG tablet, Take 100 mg by mouth every 6 (six) hours as needed for fever., Disp: , Rfl:  .  predniSONE (DELTASONE) 20 MG tablet, TAKE 1/2 (ONE-HALF) TABLET BY MOUTH ONCE DAILY WITH BREAKFAST, Disp: 45 tablet, Rfl: 0 .  SYRINGE-NEEDLE, DISP, 3 ML (BD SAFETYGLIDE SYRINGE/NEEDLE) 25G X 1" 3 ML MISC, Inject 46m in deltoid once weekly for 4 weeks, then inject 1 ml once a month thereafter, Disp: 100 each, Rfl: 11 .  tamsulosin (FLOMAX) 0.4 MG CAPS capsule, TAKE 1 CAPSULE BY MOUTH ONCE DAILY AFTER BREAKFAST, Disp: 90 capsule, Rfl: 0 .  traMADol (ULTRAM) 50 MG tablet, TAKE 1/2 (ONE-HALF) TABLET BY MOUTH TWICE DAILY FOR  CHRONIC  PAIN, Disp: 90 tablet, Rfl: 0 .  triamcinolone cream (KENALOG) 0.1 %, CREAM TO AFFECTED AREA TWICE DAILY (Patient taking differently: Apply 1 application topically 2 (two) times daily as needed (itching). Around the nose), Disp: 80 g, Rfl: 3 .  warfarin (COUMADIN) 5 MG tablet, Take 1/2 tablet daily except take 1 tablet on Mon and Friday or Take as directed by anticoagulation clinic (Patient taking differently: Take 2.5-5 mg by mouth daily. Take 575mon MON WED and FRI.  Take 2.5m27mn TUE THU SAT and SUN.), Disp: 75 tablet, Rfl: 1 .  traZODone (DESYREL) 50 MG tablet, Take 1 tablet (50 mg total) by mouth at bedtime as needed., Disp: 90 tablet, Rfl: 1  Review of Systems:  Constitutional: Denies fever, chills,  diaphoresis, appetite change and fatigue.  HEENT: Denies photophobia, eye pain, redness, hearing loss, ear pain, congestion, sore throat, rhinorrhea, sneezing, mouth sores, trouble swallowing, neck pain, neck stiffness and tinnitus.   Respiratory: Denies SOB, DOE, cough, chest tightness,  and wheezing.   Cardiovascular: Denies chest pain, palpitations and leg swelling.  Gastrointestinal: Denies nausea, vomiting, abdominal pain, diarrhea, constipation, blood in stool and abdominal distention.  Genitourinary: Denies dysuria, urgency, frequency, hematuria, flank pain and difficulty urinating.  Endocrine: Denies: hot or cold intolerance, sweats, changes in hair or nails, polyuria, polydipsia. Musculoskeletal: Denies myalgias, back pain, joint swelling, arthralgias and gait problem.  Skin: Denies pallor, rash and wound.  Neurological: Denies dizziness, seizures, syncope, weakness, light-headedness, numbness and headaches.  Hematological: Denies adenopathy. Easy bruising, personal or family bleeding history  Psychiatric/Behavioral: Denies suicidal ideation, mood changes, confusion, nervousness, sleep disturbance and agitation  Physical Exam: Vitals:   10/21/20 0939  BP: 118/70  Pulse: 64  Temp: 98 F (36.7 C)  TempSrc: Oral  SpO2: 96%  Weight: 172 lb 12.8 oz (78.4 kg)  Height: 5' 5.5" (1.664 m)    Body mass index is 28.32 kg/m.  Constitutional: NAD, calm, comfortable Eyes: PERRL, lids and conjunctivae normal, wears corrective lenses ENMT: Mucous membranes are moist. Posterior pharynx clear of any exudate or lesions. Normal dentition. Tympanic membrane is pearly white, no erythema or bulging. Neck: normal, supple, no masses, no thyromegaly Respiratory: clear to auscultation bilaterally, no wheezing, no crackles. Normal respiratory effort. No accessory muscle use.  Cardiovascular: Irregular rate and rhythm, no murmurs / rubs / gallops. No extremity edema. 2+ pedal pulses. No carotid  bruits.  Abdomen: no tenderness, no masses palpated. No hepatosplenomegaly. Bowel sounds positive.  Musculoskeletal: no clubbing / cyanosis. No joint deformity upper and lower extremities. Good ROM, no contractures. Normal muscle tone.  Skin: no rashes, lesions, ulcers. No induration Neurologic: CN 2-12 grossly intact. Sensation intact, DTR normal. Strength 5/5 in all 4.  Psychiatric: Normal judgment and insight. Alert and oriented x 3. Normal mood.    Subsequent Medicare wellness visit   1. Risk factors, based on past  M,S,F -cardiovascular disease risk factors include age, gender, history of hypertension   2.  Physical activities: Walks around 9 to 10 miles a week   3.  Depression/mood:  Stable, not depressed   4.  Hearing:  No perceived issues   5.  ADL's: Independent in all ADLs   6.  Fall risk:  No fall risk   7.  Home safety: No problems identified   8.  Height weight, and visual acuity: height and weight as above, vision:   Visual Acuity Screening   Right eye Left eye Both eyes  Without correction:     With correction: _0     10. Lab orders based on risk factors: Laboratory update will be reviewed   11. Referral :  None today   12. Care plan:  Follow-up with me in 6 months   13. Cognitive assessment:  No cognitive impairment   14. Screening: Patient provided with a written and personalized 5-10 year screening schedule in the AVS.   yes   15. Provider List Update:   PCP, cardiology Dr. Stanford Breed, pulmonary Dr. Erin Fulling, ENT Dr. Joya Gaskins  16. Advance Directives: Full code   17. Opioids: Patient is not on any opioid prescriptions and has no risk factors for a substance use disorder.   [place] Office Visit from 10/21/2020 in [place] at [facility]  PHQ-9 Total Score 2      Fall Risk  10/21/2020 09/24/2019 09/17/2018 07/18/2018 09/11/2016  Falls in the past year? 0 0 0 0 No  Number falls in past yr: 0 0 0 0 -  Injury with Fall? 0 0 0 0  -     Impression and Plan:  Encounter for preventive health examination  -He has routine eye and dental care. -Due for Tdap which he will obtain at his pharmacy, otherwise immunizations are up-to-date. -Screening labs today. -Healthy lifestyle discussed in detail. -He wishes to no longer pursue cancer screening due to his age, I agree with this decision.  Vitamin D deficiency  - Plan: VITAMIN D 25 Hydroxy (Vit-D Deficiency, Fractures)  Vitamin B12 deficiency  - Plan: Vitamin J09  Chronic diastolic CHF (congestive heart failure) ([place])  -Compensated, followed by cardiology.  Essential hypertension -Well-controlled.  S/P AVR (aortic valve replacement) Atrial fibrillation, unspecified type ([place]) -Maintained on chronic anticoagulation with Coumadin, we will need to reconsider given his ongoing hemoptysis.  Hemoptysis -Currently undergoing work-up by ENT and pulmonary  Insomnia, unspecified type  - Plan: traZODone (DESYREL) 50 MG tablet   Patient Instructions   -Nice seeing you today!!  -Lab work today; will notify you once results are available.  -Remember your tetanus vaccine at the pharmacy.  -Schedule follow up in 6 months or sooner as needed.   Preventive Care 61 Years and Older, Male Preventive care refers to lifestyle choices and visits with your health care provider that can promote health and wellness. This includes:  A yearly physical exam. This is also called an annual wellness visit.  Regular dental and eye exams.  Immunizations.  Screening for certain conditions.  Healthy lifestyle choices, such as: ? Eating a healthy diet. ? Getting regular exercise. ? Not using drugs or products that contain nicotine and tobacco. ? Limiting alcohol use. What can I expect for my preventive care visit? Physical exam Your health care provider will check your:  Height and weight. These may be used to calculate your BMI (body mass index). BMI is a measurement  that tells if you are at a healthy weight.  Heart rate and blood pressure.  Body temperature.  Skin for abnormal spots. Counseling Your health care provider may ask you questions about your:  Past medical problems.  Family's medical history.  Alcohol, tobacco, and drug use.  Emotional well-being.  Home life and relationship well-being.  Sexual activity.  Diet, exercise, and sleep habits.  History of falls.  Memory and ability to understand (cognition).  Work and work Statistician.  Access to firearms. What immunizations do I need? Vaccines are usually given at various ages, according to a schedule. Your health care provider will recommend vaccines for you based on your age, medical history, and lifestyle or other factors, such as travel or where you work.   What tests do I need? Blood tests  Lipid and cholesterol levels. These may be checked every 5 years, or more often depending on your overall health.  Hepatitis C test.  Hepatitis B test. Screening  Lung cancer screening. You may have this screening every year starting at age 57 if you have a 30-pack-year history of smoking and currently smoke or have quit within the past 15 years.  Colorectal cancer screening. ? All adults should have this screening starting at age 16 and continuing until age 12. ? Your health care provider may recommend screening at age 66 if you are at increased risk. ? You will have tests every 1-10 years, depending on your results and the type of screening test.  Prostate cancer screening. Recommendations will vary depending on your family history and other risks.  Genital exam to check for testicular cancer or hernias.  Diabetes screening. ? This is done by checking your blood sugar (glucose) after you have not eaten for a while (fasting). ? You may have this done every 1-3 years.  Abdominal aortic aneurysm (AAA) screening. You may need this if you are a current or former  smoker.  STD (sexually transmitted disease) testing, if you are at risk. Follow these instructions at home: Eating and drinking  Eat a diet that includes fresh fruits and vegetables, whole grains, lean protein, and low-fat dairy products. Limit your intake of foods with high amounts of sugar, saturated fats, and salt.  Take vitamin and mineral supplements as recommended  by your health care provider.  Do not drink alcohol if your health care provider tells you not to drink.  If you drink alcohol: ? Limit how much you have to 0-2 drinks a day. ? Be aware of how much alcohol is in your drink. In the [place] one drink equals one 12 oz bottle of beer (355 mL), one 5 oz glass of wine (148 mL), or one 1 oz glass of hard liquor (44 mL).   Lifestyle  Take daily care of your teeth and gums. Brush your teeth every morning and night with fluoride toothpaste. Floss one time each day.  Stay active. Exercise for at least 30 minutes 5 or more days each week.  Do not use any products that contain nicotine or tobacco, such as cigarettes, e-cigarettes, and chewing tobacco. If you need help quitting, ask your health care provider.  Do not use drugs.  If you are sexually active, practice safe sex. Use a condom or other form of protection to prevent STIs (sexually transmitted infections).  Talk with your health care provider about taking a low-dose aspirin or statin.  Find healthy ways to cope with stress, such as: ? Meditation, yoga, or listening to music. ? Journaling. ? Talking to a trusted person. ? Spending time with friends and family. Safety  Always wear your seat belt while driving or riding in a vehicle.  Do not drive: ? If you have been drinking alcohol. Do not ride with someone who has been drinking. ? When you are tired or distracted. ? While texting.  Wear a helmet and other protective equipment during sports activities.  If you have firearms in your house, make sure you follow all  gun safety procedures. What's next?  Visit your health care provider once a year for an annual wellness visit.  Ask your health care provider how often you should have your eyes and teeth checked.  Stay up to date on all vaccines. This information is not intended to replace advice given to you by your health care provider. Make sure you discuss any questions you have with your health care provider. Document Revised: 05/13/2019 Document Reviewed: 08/08/2018 Elsevier Patient Education  2021 [place], MD [facility]

## 2020-10-21 NOTE — Patient Instructions (Signed)
-Nice seeing you today!!  -Lab work today; will notify you once results are available.  -Remember your tetanus vaccine at the pharmacy.  -Schedule follow up in 6 months or sooner as needed.   Preventive Care 83 Years and Older, Male Preventive care refers to lifestyle choices and visits with your health care provider that can promote health and wellness. This includes:  A yearly physical exam. This is also called an annual wellness visit.  Regular dental and eye exams.  Immunizations.  Screening for certain conditions.  Healthy lifestyle choices, such as: ? Eating a healthy diet. ? Getting regular exercise. ? Not using drugs or products that contain nicotine and tobacco. ? Limiting alcohol use. What can I expect for my preventive care visit? Physical exam Your health care provider will check your:  Height and weight. These may be used to calculate your BMI (body mass index). BMI is a measurement that tells if you are at a healthy weight.  Heart rate and blood pressure.  Body temperature.  Skin for abnormal spots. Counseling Your health care provider may ask you questions about your:  Past medical problems.  Family's medical history.  Alcohol, tobacco, and drug use.  Emotional well-being.  Home life and relationship well-being.  Sexual activity.  Diet, exercise, and sleep habits.  History of falls.  Memory and ability to understand (cognition).  Work and work Statistician.  Access to firearms. What immunizations do I need? Vaccines are usually given at various ages, according to a schedule. Your health care provider will recommend vaccines for you based on your age, medical history, and lifestyle or other factors, such as travel or where you work.   What tests do I need? Blood tests  Lipid and cholesterol levels. These may be checked every 5 years, or more often depending on your overall health.  Hepatitis C test.  Hepatitis B  test. Screening  Lung cancer screening. You may have this screening every year starting at age 83 if you have a 30-pack-year history of smoking and currently smoke or have quit within the past 15 years.  Colorectal cancer screening. ? All adults should have this screening starting at age 83 and continuing until age 83. ? Your health care provider may recommend screening at age 83 if you are at increased risk. ? You will have tests every 1-10 years, depending on your results and the type of screening test.  Prostate cancer screening. Recommendations will vary depending on your family history and other risks.  Genital exam to check for testicular cancer or hernias.  Diabetes screening. ? This is done by checking your blood sugar (glucose) after you have not eaten for a while (fasting). ? You may have this done every 1-3 years.  Abdominal aortic aneurysm (AAA) screening. You may need this if you are a current or former smoker.  STD (sexually transmitted disease) testing, if you are at risk. Follow these instructions at home: Eating and drinking  Eat a diet that includes fresh fruits and vegetables, whole grains, lean protein, and low-fat dairy products. Limit your intake of foods with high amounts of sugar, saturated fats, and salt.  Take vitamin and mineral supplements as recommended by your health care provider.  Do not drink alcohol if your health care provider tells you not to drink.  If you drink alcohol: ? Limit how much you have to 0-2 drinks a day. ? Be aware of how much alcohol is in your drink. In the [place] one drink equals  one 12 oz bottle of beer (355 mL), one 5 oz glass of wine (148 mL), or one 1 oz glass of hard liquor (44 mL).   Lifestyle  Take daily care of your teeth and gums. Brush your teeth every morning and night with fluoride toothpaste. Floss one time each day.  Stay active. Exercise for at least 30 minutes 5 or more days each week.  Do not use any products  that contain nicotine or tobacco, such as cigarettes, e-cigarettes, and chewing tobacco. If you need help quitting, ask your health care provider.  Do not use drugs.  If you are sexually active, practice safe sex. Use a condom or other form of protection to prevent STIs (sexually transmitted infections).  Talk with your health care provider about taking a low-dose aspirin or statin.  Find healthy ways to cope with stress, such as: ? Meditation, yoga, or listening to music. ? Journaling. ? Talking to a trusted person. ? Spending time with friends and family. Safety  Always wear your seat belt while driving or riding in a vehicle.  Do not drive: ? If you have been drinking alcohol. Do not ride with someone who has been drinking. ? When you are tired or distracted. ? While texting.  Wear a helmet and other protective equipment during sports activities.  If you have firearms in your house, make sure you follow all gun safety procedures. What's next?  Visit your health care provider once a year for an annual wellness visit.  Ask your health care provider how often you should have your eyes and teeth checked.  Stay up to date on all vaccines. This information is not intended to replace advice given to you by your health care provider. Make sure you discuss any questions you have with your health care provider. Document Revised: 05/13/2019 Document Reviewed: 08/08/2018 Elsevier Patient Education  2021 [employer].

## 2020-10-22 ENCOUNTER — Other Ambulatory Visit: Payer: Self-pay | Admitting: Internal Medicine

## 2020-10-22 DIAGNOSIS — E785 Hyperlipidemia, unspecified: Secondary | ICD-10-CM

## 2020-10-22 LAB — FUNGUS CULTURE RESULT

## 2020-10-22 LAB — FUNGUS CULTURE WITH STAIN

## 2020-10-22 LAB — FUNGAL ORGANISM REFLEX

## 2020-11-01 DIAGNOSIS — J384 Edema of larynx: Secondary | ICD-10-CM | POA: Diagnosis not present

## 2020-11-01 DIAGNOSIS — R042 Hemoptysis: Secondary | ICD-10-CM | POA: Diagnosis not present

## 2020-11-01 DIAGNOSIS — Z7901 Long term (current) use of anticoagulants: Secondary | ICD-10-CM | POA: Diagnosis not present

## 2020-11-03 ENCOUNTER — Telehealth: Payer: Self-pay | Admitting: Pulmonary Disease

## 2020-11-03 NOTE — Telephone Encounter (Signed)
Call returned to patient, confirmed DOB. Patient states MD Erin Fulling was referred to Dr Joya Gaskins. He has another appt with Dr Joya Gaskins Monday at 1:30pm. Patient states Dr Joya Gaskins was supposed to send Dr Erin Fulling information regarding his appt and what his findings were. I made him I would send a message to Dr Erin Fulling however he should also call Dr Bettina Gavia office as far as test results, etc. Patient states he is able to breath okay and denies chest pain. No acute distress however he is still having some hemoptysis.   JD please advise if you have received any information from Dr Joya Gaskins regarding this patient. Patient aware you are not back in clinic this week until Friday. Thanks :)

## 2020-11-04 ENCOUNTER — Telehealth: Payer: Self-pay | Admitting: Pulmonary Disease

## 2020-11-04 LAB — ACID FAST CULTURE WITH REFLEXED SENSITIVITIES (MYCOBACTERIA)
Acid Fast Culture: NEGATIVE
Acid Fast Culture: NEGATIVE

## 2020-11-04 NOTE — Telephone Encounter (Signed)
I have called the pt and spoke with him and he will call Dr. Ileana Roup office and have them fax over the information for [name] to review.

## 2020-11-04 NOTE — Telephone Encounter (Signed)
I am not able to see Dr. Bettina Gavia procedure report that was done on 11/01/20 via [place] in [place]. Can you please request that they send a copy of the report for my review or have them email it to me.   Thanks, Wille Glaser

## 2020-11-04 NOTE — Telephone Encounter (Signed)
LMTCB

## 2020-11-05 NOTE — Telephone Encounter (Signed)
LMTCB and will close per protocol  

## 2020-11-09 ENCOUNTER — Telehealth: Payer: Self-pay | Admitting: Pulmonary Disease

## 2020-11-09 DIAGNOSIS — R042 Hemoptysis: Secondary | ICD-10-CM

## 2020-11-09 NOTE — Telephone Encounter (Signed)
LMTCB with patient.   LM with nurse line for dr Ileana Roup office requesting office notes from 11/01/20.

## 2020-11-10 NOTE — Telephone Encounter (Signed)
I do not see any paperwork from Dr. Ileana Roup office, Dr. Erin Fulling have you seen [facility] notes on this patient?

## 2020-11-10 NOTE — Telephone Encounter (Signed)
Forwarding to [facility] since working on triage downstairs to check and see if notes from Dr [employer] office have been received. Thank you!

## 2020-11-11 ENCOUNTER — Other Ambulatory Visit: Payer: Self-pay

## 2020-11-11 ENCOUNTER — Ambulatory Visit (INDEPENDENT_AMBULATORY_CARE_PROVIDER_SITE_OTHER): Payer: Medicare Other | Admitting: General Practice

## 2020-11-11 DIAGNOSIS — Z7901 Long term (current) use of anticoagulants: Secondary | ICD-10-CM | POA: Diagnosis not present

## 2020-11-11 DIAGNOSIS — I4891 Unspecified atrial fibrillation: Secondary | ICD-10-CM

## 2020-11-11 LAB — POCT INR: INR: 3.8 — AB (ref 2.0–3.0)

## 2020-11-11 NOTE — Progress Notes (Signed)
Medical screening examination/treatment/procedure(s) were performed by non-physician practitioner and as supervising physician I was immediately available for consultation/collaboration. I agree with above. Rutilio Yellowhair, MD   

## 2020-11-11 NOTE — Patient Instructions (Incomplete)
Pre visit review using our clinic review tool, if applicable. No additional management support is needed unless otherwise documented below in the visit note.  Skip coumadin today (3/17) and take 1/2 tablet tomorrow (3/18) and then change dosage and take 1/2 tablet daily except 1 tablet on Monday and Fridays.   Re-check in 3 weeks.

## 2020-11-11 NOTE — Telephone Encounter (Signed)
Notes were uploaded to [facility] yesterday around 8am. We have received a copy of the procedure notes. They have been placed in JD's review folder. Will give to him this afternoon.

## 2020-11-15 NOTE — Telephone Encounter (Signed)
Pt returning a phone call. Pt can be reached at [phone]

## 2020-11-16 NOTE — Telephone Encounter (Signed)
Called and spoke to pt. Informed him of that the notes were received. Pt states he is still having hemoptysis and is wanted to Dr. Erin Fulling to review the notes from Dr. Joya Gaskins and call him with a plan. Pt states he cannot afford to come in for another visit as he hasnt been able to pay his co-pays and does not want another co-pay to add to the bills.   Will forward to Dr. Erin Fulling to advise further. Thanks.

## 2020-11-16 NOTE — Telephone Encounter (Signed)
I have spoken with [name]. I updated him that the bleeding does not appear to be coming from his upper airway from the procedure by Dr. Joya Gaskins, ENT at [facility].   I discussed with him about updating the CT Chest w/ contrast in order to follow the lesion of his right lower lobe.   Please have this scheduled this week if possible.  Thanks, Wille Glaser

## 2020-11-16 NOTE — Progress Notes (Signed)
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 Preoperative cardiac catheterization revealed normal coronary arteries. He has permanent atrial fibrillation. Previous carotid Dopplers due to bruits revealed 0-39% bilateral stenosis. Admitted9/18with back pain for planned laminectomy. However he was found to have spontaneous epidural hematoma which was evacuated. Discussed with Dr Kathyrn Sheriff and he felt anticoagulation could be carefully resumed.Apixaban resumed atpreviousov.Last echocardiogram April 2019 showed normal LV function, bioprosthetic aortic valve with mean gradient 13 mmHg, moderate to severe left atrial enlargement, moderate to severe right atrial enlargement, mild right ventricular enlargement. Monitor April 2019 showed atrial fibrillation withmultiple pauses greater than 3 seconds all occurring late p.m. or early a.m. hours. We asked patient to be seen by electrophysiology following results ofpreviousevent monitor for consideration of pacemaker. Patient declined.  Patient recently undergoing evaluation for hemoptysis. Since I last sawhim,he has some dyspnea on exertion.  No orthopnea, PND, pedal edema, chest pain, palpitations or syncope.  Current Outpatient Medications  Medication Sig Dispense Refill  . allopurinol (ZYLOPRIM) 300 MG tablet Take 1 tablet by mouth once daily (Patient taking differently: Take 300 mg by mouth daily.) 90 tablet 1  . amLODipine (NORVASC) 5 MG tablet Take 1 tablet by mouth once daily (Patient taking differently: Take 5 mg by mouth daily.) 90 tablet 1  . Cholecalciferol (EQL VITAMIN D3) 50 MCG (2000 UT) CAPS Take 2,000 Units by mouth daily.    . ferrous sulfate 325 (65 FE) MG tablet Take 325 mg by mouth daily with breakfast.    . furosemide (LASIX) 40 MG tablet Take 1 tablet by mouth once daily 90 tablet 0  . ibuprofen (ADVIL) 100 MG tablet Take 100  mg by mouth every 6 (six) hours as needed for fever.    . predniSONE (DELTASONE) 20 MG tablet TAKE 1/2 (ONE-HALF) TABLET BY MOUTH ONCE DAILY WITH BREAKFAST 45 tablet 0  . SYRINGE-NEEDLE, DISP, 3 ML (BD SAFETYGLIDE SYRINGE/NEEDLE) 25G X 1" 3 ML MISC Inject 28m in deltoid once weekly for 4 weeks, then inject 1 ml once a month thereafter 100 each 11  . tamsulosin (FLOMAX) 0.4 MG CAPS capsule TAKE 1 CAPSULE BY MOUTH ONCE DAILY AFTER BREAKFAST 90 capsule 0  . traMADol (ULTRAM) 50 MG tablet TAKE 1/2 (ONE-HALF) TABLET BY MOUTH TWICE DAILY FOR  CHRONIC  PAIN 90 tablet 0  . traZODone (DESYREL) 50 MG tablet Take 1 tablet (50 mg total) by mouth at bedtime as needed. 90 tablet 1  . triamcinolone cream (KENALOG) 0.1 % CREAM TO AFFECTED AREA TWICE DAILY (Patient taking differently: Apply 1 application topically 2 (two) times daily as needed (itching). Around the nose) 80 g 3  . warfarin (COUMADIN) 5 MG tablet Take 1/2 tablet daily except take 1 tablet on Mon and Friday or Take as directed by anticoagulation clinic (Patient taking differently: Take 2.5-5 mg by mouth daily. Take 580mon MON WED and FRI.  Take 2.59m80mn TUE THU SAT and SUN.) 75 tablet 1   No current facility-administered medications for this visit.     Past Medical History:  Diagnosis Date  . Aortic stenosis   . Atrial fibrillation (HCCAriton . Baker cyst    Left knee  . DJD (degenerative joint disease)    knees  . Dysrhythmia   . Eczema   . Erectile dysfunction   . Gout   . HTN (hypertension)   . MVP (mitral valve prolapse)   .  Shortness of breath dyspnea    with activity    Past Surgical History:  Procedure Laterality Date  .  polyps vocal cord  1994  . AORTIC VALVE REPLACEMENT  ~2004  . BRONCHIAL WASHINGS  09/21/2020   Procedure: BRONCHIAL WASHINGS;  Surgeon: Freddi Starr, MD;  Location: [facility];  Service: Pulmonary;;  BAL   . HERNIA REPAIR  ~1978  . HERNIA REPAIR  ~1983  . LUMBAR LAMINECTOMY/DECOMPRESSION  MICRODISCECTOMY N/A 01/15/2015   Procedure: 2 LEVEL DECOMPRESSIVE LUMBAR LAMINECTOMY L3-L4,L4-L5;  Surgeon: Latanya Maudlin, MD;  Location: [facility];  Service: Orthopedics;  Laterality: N/A;  . LUMBAR LAMINECTOMY/DECOMPRESSION MICRODISCECTOMY N/A 05/01/2017   Procedure: Lumbar one-Lumbar two, Lumbar two-Lumbar three, Lumbar three-Lumbar four Laminectomy; Evacuation of hematoma;  Surgeon: Consuella Lose, MD;  Location: [place];  Service: Neurosurgery;  Laterality: N/A;  . right total hip  ~4 years ago  . ROTATOR CUFF REPAIR Left ~2009  . TOTAL KNEE ARTHROPLASTY Left 04/14/2015   Procedure: LEFT TOTAL KNEE ARTHROPLASTY;  Surgeon: Latanya Maudlin, MD;  Location: [facility];  Service: Orthopedics;  Laterality: Left;  Marland Kitchen VIDEO BRONCHOSCOPY N/A 09/21/2020   Procedure: VIDEO BRONCHOSCOPY WITHOUT FLUORO;  Surgeon: Freddi Starr, MD;  Location: [facility];  Service: Pulmonary;  Laterality: N/A;    Social History   Socioeconomic History  . Marital status: Divorced    Spouse name: Not on file  . Number of children: Not on file  . Years of education: Not on file  . Highest education level: Not on file  Occupational History  . Not on file  Tobacco Use  . Smoking status: Former Smoker    Years: 30.00    Types: Cigarettes    Quit date: 08/28/2002    Years since quitting: 18.2  . Smokeless tobacco: Never Used  Vaping Use  . Vaping Use: Never used  Substance and Sexual Activity  . Alcohol use: Yes    Comment: social  . Drug use: No  . Sexual activity: Not on file  Other Topics Concern  . Not on file  Social History Narrative  . Not on file   Social Determinants of Health   Financial Resource Strain: Not on file  Food Insecurity: Not on file  Transportation Needs: Not on file  Physical Activity: Not on file  Stress: Not on file  Social Connections: Not on file  Intimate Partner Violence: Not on file    Family History  Problem Relation Age of Onset  . Diabetes Other   . Stroke Other      ROS: no fevers or chills, productive cough, hemoptysis, dysphasia, odynophagia, melena, hematochezia, dysuria, hematuria, rash, seizure activity, orthopnea, PND, pedal edema, claudication. Remaining systems are negative.  Physical Exam: Well-developed well-nourished in no acute distress.  Skin is warm and dry.  HEENT is normal.  Neck is supple.  Chest is clear to auscultation with normal expansion.  Cardiovascular exam is atrial fibrillation, 2/6 systolic murmur left sternal border.  No diastolic murmur. Abdominal exam nontender or distended. No masses palpated. Extremities show no edema. neuro grossly intact  ECG-atrial fibrillation at a rate of 99, incomplete right bundle branch block.  Personally reviewed  A/P  1 permanent atrial fibrillation-patient's heart rate is controlled on no medications.  Continue Coumadin with goal INR 2-3.  He declines DOAC's.  2 hypertension-blood pressure controlled.  Continue present medical regimen.  3 status post aortic valve replacement-continue SBE prophylaxis.  Repeat echocardiogram given mild dyspnea on exertion.  4 chronic diastolic congestive  heart failure-euvolemic today.  Continue diuretic at present dose.  5 history of bradycardia-patient noted to have greater than 3-second pauses on previous monitor typically occurring in the early a.m. hours.  However there is no history of syncope.  Continue observation.  6 history of submassive hemoptysis-being evaluated by pulmonary.  Most recent hemoglobin 13.5.  7 hypokalemia-noted on recent blood work with potassium 3.3.  Add kdur 20 millequivalents p.o. daily.  Check potassium and renal function in 1 week.  Kirk Ruths, MD

## 2020-11-16 NOTE — Telephone Encounter (Signed)
CMP last drawn 10/21/20, CT scheduled.  Nothing further needed.

## 2020-11-19 ENCOUNTER — Other Ambulatory Visit: Payer: Self-pay | Admitting: Cardiology

## 2020-11-19 ENCOUNTER — Telehealth: Payer: Self-pay | Admitting: Pulmonary Disease

## 2020-11-19 DIAGNOSIS — R609 Edema, unspecified: Secondary | ICD-10-CM

## 2020-11-19 DIAGNOSIS — R042 Hemoptysis: Secondary | ICD-10-CM

## 2020-11-19 NOTE — Telephone Encounter (Signed)
Called [facility] and spoke with imaging who stated that pt needs to have Bun, creat, and GFR drawn before his upcoming CT w/ Contrat on 3/29. Stated to her that we would get this taken care of.  Called and spoke with pt letting him know this info and he stated he would come by our office Monday morning, 3/28. Orders have been placed. Nothing further needed.

## 2020-11-22 ENCOUNTER — Other Ambulatory Visit (INDEPENDENT_AMBULATORY_CARE_PROVIDER_SITE_OTHER): Payer: Medicare Other

## 2020-11-22 ENCOUNTER — Ambulatory Visit: Payer: Medicare Other | Admitting: Cardiology

## 2020-11-22 ENCOUNTER — Encounter: Payer: Self-pay | Admitting: Cardiology

## 2020-11-22 ENCOUNTER — Other Ambulatory Visit: Payer: Self-pay | Admitting: Pulmonary Disease

## 2020-11-22 ENCOUNTER — Other Ambulatory Visit: Payer: Self-pay

## 2020-11-22 VITALS — BP 128/70 | HR 99 | Ht 65.5 in | Wt 168.0 lb

## 2020-11-22 DIAGNOSIS — R0602 Shortness of breath: Secondary | ICD-10-CM | POA: Diagnosis not present

## 2020-11-22 DIAGNOSIS — Z952 Presence of prosthetic heart valve: Secondary | ICD-10-CM | POA: Diagnosis not present

## 2020-11-22 DIAGNOSIS — I4821 Permanent atrial fibrillation: Secondary | ICD-10-CM

## 2020-11-22 DIAGNOSIS — R042 Hemoptysis: Secondary | ICD-10-CM

## 2020-11-22 DIAGNOSIS — I5032 Chronic diastolic (congestive) heart failure: Secondary | ICD-10-CM

## 2020-11-22 LAB — BASIC METABOLIC PANEL
BUN: 21 mg/dL (ref 6–23)
CO2: 31 mEq/L (ref 19–32)
Calcium: 9 mg/dL (ref 8.4–10.5)
Chloride: 101 mEq/L (ref 96–112)
Creatinine, Ser: 1.17 mg/dL (ref 0.40–1.50)
GFR: 57.87 mL/min — ABNORMAL LOW (ref >60.00)
Glucose, Bld: 144 mg/dL — ABNORMAL HIGH (ref 70–99)
Potassium: 3.3 mEq/L — ABNORMAL LOW (ref 3.5–5.1)
Sodium: 140 mEq/L (ref 135–145)

## 2020-11-22 MED ORDER — POTASSIUM CHLORIDE CRYS ER 20 MEQ PO TBCR: 20.0000 meq | EXTENDED_RELEASE_TABLET | Freq: Every day | ORAL | 3 refills | Status: DC

## 2020-11-22 NOTE — Patient Instructions (Signed)
Medication Instructions:   START POTASSIUM CHLORIDE 209 MEQ ONCE DAILY  *If you need a refill on your cardiac medications before your next appointment, please call your pharmacy*   Lab Work:  Your physician recommends that you return for lab work in: [place]  If you have labs (blood work) drawn today and your tests are completely normal, you will receive your results only by: Marland Kitchen MyChart Message (if you have MyChart) OR . A paper copy in the mail If you have any lab test that is abnormal or we need to change your treatment, we will call you to review the results.   Testing/Procedures:  Your physician has requested that you have an echocardiogram. Echocardiography is a painless test that uses sound waves to create images of your heart. It provides your doctor with information about the size and shape of your heart and how well your heart's chambers and valves are working. This procedure takes approximately one hour. There are no restrictions for this procedure.[place]     Follow-Up: At [facility], you and your health needs are our priority.  As part of our continuing mission to provide you with exceptional heart care, we have created designated Provider Care Teams.  These Care Teams include your primary Cardiologist (physician) and Advanced Practice Providers (APPs -  Physician Assistants and Nurse Practitioners) who all work together to provide you with the care you need, when you need it.  We recommend signing up for the patient portal called "MyChart".  Sign up information is provided on this After Visit Summary.  MyChart is used to connect with patients for Virtual Visits (Telemedicine).  Patients are able to view lab/test results, encounter notes, upcoming appointments, etc.  Non-urgent messages can be sent to your provider as well.   To learn more about what you can do with MyChart, go to [email or web address].    Your next appointment:   6 month(s)  The  format for your next appointment:   In Person  Provider:   Kirk Ruths, MD

## 2020-11-23 ENCOUNTER — Ambulatory Visit (HOSPITAL_BASED_OUTPATIENT_CLINIC_OR_DEPARTMENT_OTHER)
Admission: RE | Admit: 2020-11-23 | Discharge: 2020-11-23 | Disposition: A | Discharge status: Home / Self Care | Payer: Medicare Other | Source: Ambulatory Visit | Attending: Pulmonary Disease | Admitting: Pulmonary Disease

## 2020-11-23 DIAGNOSIS — J432 Centrilobular emphysema: Secondary | ICD-10-CM | POA: Diagnosis not present

## 2020-11-23 DIAGNOSIS — J984 Other disorders of lung: Secondary | ICD-10-CM | POA: Diagnosis not present

## 2020-11-23 DIAGNOSIS — I708 Atherosclerosis of other arteries: Secondary | ICD-10-CM | POA: Diagnosis not present

## 2020-11-23 DIAGNOSIS — R042 Hemoptysis: Secondary | ICD-10-CM | POA: Diagnosis not present

## 2020-11-23 DIAGNOSIS — I2510 Atherosclerotic heart disease of native coronary artery without angina pectoris: Secondary | ICD-10-CM | POA: Diagnosis not present

## 2020-11-23 MED ORDER — IOHEXOL 300 MG/ML  SOLN
100.0000 mL | Freq: Once | INTRAMUSCULAR | Status: AC | PRN
  Administered 2020-11-23: 75 mL via INTRAVENOUS

## 2020-11-30 ENCOUNTER — Telehealth: Payer: Self-pay | Admitting: Pulmonary Disease

## 2020-11-30 DIAGNOSIS — Z7952 Long term (current) use of systemic steroids: Secondary | ICD-10-CM

## 2020-11-30 MED ORDER — PREDNISONE 20 MG PO TABS
ORAL_TABLET | ORAL | 0 refills | Status: DC
Start: 2020-11-30 — End: 2020-12-22

## 2020-11-30 MED ORDER — AMOXICILLIN-POT CLAVULANATE 875-125 MG PO TABS: 1.0000 | ORAL_TABLET | Freq: Two times a day (BID) | ORAL | 0 refills | Status: DC

## 2020-11-30 NOTE — Telephone Encounter (Signed)
I spoke with the patient on the phone and updated him about his recent CT scan. I told him the lesion in the right lower lobe has not grown in size. We still do not know exactly where his bleeding is coming from but it is likely due to his anticoagulation.   We will try a course of steroids and antibiotics to treat his hemoptysis.   Please send in prednisone 40mg  for 5 days, then 20mg  for 5 days and back to his baseline of 10mg  of prednisone daily.   Also send in augmentin 875mg  twice daily for 7 days.   Thank you, Wille Glaser

## 2020-11-30 NOTE — Telephone Encounter (Signed)
Medication has been sent to the pharmacy per JD.  Nothing further is needed.

## 2020-11-30 NOTE — Telephone Encounter (Signed)
Patient wanting results to their CT done on 11/23/20. No results in computer yet. Called and let patient know.   Will route to Dr. Erin Fulling for results

## 2020-12-01 DIAGNOSIS — I5032 Chronic diastolic (congestive) heart failure: Secondary | ICD-10-CM | POA: Diagnosis not present

## 2020-12-01 LAB — BASIC METABOLIC PANEL
BUN/Creatinine Ratio: 17 (ref 10–24)
BUN: 21 mg/dL (ref 8–27)
CO2: 25 mmol/L (ref 20–29)
Calcium: 9 mg/dL (ref 8.6–10.2)
Chloride: 100 mmol/L (ref 96–106)
Creatinine, Ser: 1.25 mg/dL (ref 0.76–1.27)
Glucose: 95 mg/dL (ref 65–99)
Potassium: 4.2 mmol/L (ref 3.5–5.2)
Sodium: 140 mmol/L (ref 134–144)
eGFR: 57 mL/min/{1.73_m2} — ABNORMAL LOW (ref >59)

## 2020-12-02 ENCOUNTER — Ambulatory Visit (INDEPENDENT_AMBULATORY_CARE_PROVIDER_SITE_OTHER): Payer: Medicare Other | Admitting: General Practice

## 2020-12-02 ENCOUNTER — Other Ambulatory Visit: Payer: Self-pay

## 2020-12-02 DIAGNOSIS — Z7901 Long term (current) use of anticoagulants: Secondary | ICD-10-CM

## 2020-12-02 DIAGNOSIS — I4891 Unspecified atrial fibrillation: Secondary | ICD-10-CM

## 2020-12-02 LAB — POCT INR: INR: 2.8 (ref 2.0–3.0)

## 2020-12-02 NOTE — Progress Notes (Signed)
Medical screening examination/treatment/procedure(s) were performed by non-physician practitioner and as supervising physician I was immediately available for consultation/collaboration. I agree with above. Taralyn Ferraiolo, MD   

## 2020-12-02 NOTE — Patient Instructions (Addendum)
Pre visit review using our clinic review tool, if applicable. No additional management support is needed unless otherwise documented below in the visit note.  *Take 1/2 tablet for 2 weeks.  Re-check INR in 2 weeks.  Due to prednisone dosage. Patient will be taking 40 mg of prednisone for 5 days decreasing to 20 mg for 5 days and then 10 mg.  ** Normal dosage is 1/2 tablet daily except 1 tablet on Monday and Fridays.

## 2020-12-07 ENCOUNTER — Telehealth: Payer: Self-pay | Admitting: Cardiology

## 2020-12-07 ENCOUNTER — Other Ambulatory Visit: Payer: Self-pay | Admitting: Pulmonary Disease

## 2020-12-07 NOTE — Telephone Encounter (Signed)
Spoke with pt concerning lab results. Pt os particularly concerned about his potassium level. Per labs done 2 weeks ago potassium was 3.3 which is when Dr. Stanford Breed started the pt on supplement potassium. Potassium now up to 4.2, which is within normal limits. Pt asks how long he should take the potassium, explained to pt that he should take medication until Dr. Stanford Breed instructs him to stop. Pt verbalizes understanding and is grateful for the call.

## 2020-12-07 NOTE — Telephone Encounter (Signed)
    Patient calling for lab results 

## 2020-12-08 ENCOUNTER — Telehealth: Payer: Self-pay | Admitting: Pulmonary Disease

## 2020-12-08 NOTE — Telephone Encounter (Signed)
Please extend Augmentin additional 3 days. I guess ok to continue coumadin for now since he never stopped and it has improved.   Cc: Dew[place]

## 2020-12-08 NOTE — Telephone Encounter (Signed)
ATC LVMTCB x 1  

## 2020-12-08 NOTE — Telephone Encounter (Signed)
Spoke with the pt  She is about to finish round of augmentin send by Dr Erin Fulling based on his recent ct chest results and hemoptysis   IMPRESSION: 1. Volume loss, ground-glass and septal thickening in the inferomedial aspects of both lower lobes as well as lingula, unchanged from 09/16/2020. Question postinfectious/postinflammatory scarring. Repeated episodes of aspiration could also have this appearance. 2. Vague ground-glass nodule in the apical segment right upper lobe, unchanged in the short interval from 09/16/2020 and new from 2014. Indolent adenocarcinoma cannot be excluded. Initial follow-up CT chest without contrast in 6-12 months is recommended. This recommendation follows the consensus statement: Guidelines for Management of Small Pulmonary Nodules Detected on CT Images: From the Fleischner Society 2017; Radiology 2017; 284:228-243. 3. Aortic atherosclerosis (ICD10-I70.0). Coronary artery calcification. 4. Enlarged pulmonic trunk, indicative of pulmonary arterial hypertension. 5.  Emphysema (ICD10-J43.9).  He states the hemoptysis is much improved, but still present  He is coughing up very minimal blood occ  His question is, would we want him to take more abx to see if it will clear hemoptysis all together  Note that the pt is still taking coumadin and never stopped  Please advise, thanks!

## 2020-12-09 MED ORDER — AMOXICILLIN-POT CLAVULANATE 875-125 MG PO TABS: 1.0000 | ORAL_TABLET | Freq: Two times a day (BID) | ORAL | 0 refills | Status: DC

## 2020-12-09 NOTE — Telephone Encounter (Signed)
Pt returning missed called.

## 2020-12-09 NOTE — Telephone Encounter (Signed)
Called and spoke with patient, advised of recommendations per Geraldo Pitter NP, he verbalized understanding.  Confirmed pharmacy that patient uses and Augmentin sent to pharmacy for 3 more day.  Nothing further needed.

## 2020-12-14 ENCOUNTER — Other Ambulatory Visit: Payer: Self-pay | Admitting: Internal Medicine

## 2020-12-16 ENCOUNTER — Ambulatory Visit: Payer: Medicare Other

## 2020-12-20 ENCOUNTER — Other Ambulatory Visit: Payer: Self-pay | Admitting: Internal Medicine

## 2020-12-20 DIAGNOSIS — Z7952 Long term (current) use of systemic steroids: Secondary | ICD-10-CM

## 2020-12-22 ENCOUNTER — Other Ambulatory Visit (HOSPITAL_COMMUNITY): Payer: Medicare Other

## 2020-12-23 ENCOUNTER — Telehealth: Payer: Self-pay | Admitting: Internal Medicine

## 2020-12-23 ENCOUNTER — Other Ambulatory Visit: Payer: Self-pay

## 2020-12-23 ENCOUNTER — Other Ambulatory Visit: Payer: Self-pay | Admitting: General Practice

## 2020-12-23 ENCOUNTER — Ambulatory Visit (INDEPENDENT_AMBULATORY_CARE_PROVIDER_SITE_OTHER): Payer: Medicare Other | Admitting: General Practice

## 2020-12-23 DIAGNOSIS — I4891 Unspecified atrial fibrillation: Secondary | ICD-10-CM

## 2020-12-23 DIAGNOSIS — Z7901 Long term (current) use of anticoagulants: Secondary | ICD-10-CM

## 2020-12-23 LAB — POCT INR: INR: 1.5 — AB (ref 2.0–3.0)

## 2020-12-23 MED ORDER — WARFARIN SODIUM 5 MG PO TABS: ORAL_TABLET | ORAL | 1 refills | Status: DC

## 2020-12-23 NOTE — Telephone Encounter (Signed)
[name] is calling and calling and requesting a refill for warfarin (COUMADIN) 5 MG tablet to be sent to  [facility], [place] [address] [place] [address]  Phone:  [phone] Fax:  [phone]  CB is [phone]

## 2020-12-23 NOTE — Progress Notes (Signed)
Medical screening examination/treatment/procedure(s) were performed by non-physician practitioner and as supervising physician I was immediately available for consultation/collaboration. I agree with above. Jazline Cumbee, MD   

## 2020-12-23 NOTE — Patient Instructions (Signed)
Pre visit review using our clinic review tool, if applicable. No additional management support is needed unless otherwise documented below in the visit note.  Take 1 tablet today and tomorrow and then resume taking 1/2 tablet daily except 1 tablet on Mondays and Fridays.  Re-check in 3 to 4 weeks.

## 2021-01-13 ENCOUNTER — Telehealth: Payer: Self-pay | Admitting: Internal Medicine

## 2021-01-13 NOTE — Telephone Encounter (Signed)
Patient is aware 

## 2021-01-13 NOTE — Telephone Encounter (Signed)
Pt is calling in needing to know if he need to have a Prevnar 20 vaccine and if the one that he had in 2016 how long does it last.  Pt would like to have a call back.

## 2021-01-18 ENCOUNTER — Telehealth: Payer: Self-pay | Admitting: Pulmonary Disease

## 2021-01-18 DIAGNOSIS — R042 Hemoptysis: Secondary | ICD-10-CM

## 2021-01-18 MED ORDER — AMOXICILLIN-POT CLAVULANATE 875-125 MG PO TABS: 1.0000 | ORAL_TABLET | Freq: Two times a day (BID) | ORAL | 0 refills | Status: AC

## 2021-01-18 NOTE — Telephone Encounter (Signed)
I called and spoke with Patient.  Dr. August Albino recommendations given.  Understanding stated. Nothing further at this time.

## 2021-01-18 NOTE — Telephone Encounter (Signed)
Called and spoke with Patient.  Patient stated he was prescribed Augmentin to take while he has been experiencing some hemoptysis.  Patient stated it was half dollar size amounts.  Patient stated hemoptysis stopped while taking Augmentin extended course.  Patient stated hemoptysis has started back 3 days ago.  Patient states dime sized amounts.  Patient stated he thinks that he may need additional Augmentin. Patient is still taking Coumadin monitored by coumadin clinic.  Message routed to Dr. Erin Fulling to advise

## 2021-01-18 NOTE — Telephone Encounter (Signed)
Please let the patient know I sent in 14 day course of augmentin for him to take. Have him call us once he finished the antibiotic to let us know how things are going  Thanks, Wille Glaser

## 2021-01-19 ENCOUNTER — Ambulatory Visit (HOSPITAL_COMMUNITY): Payer: Medicare Other | Attending: Cardiology

## 2021-01-19 ENCOUNTER — Other Ambulatory Visit: Payer: Self-pay

## 2021-01-19 DIAGNOSIS — Z952 Presence of prosthetic heart valve: Secondary | ICD-10-CM | POA: Insufficient documentation

## 2021-01-19 LAB — ECHOCARDIOGRAM COMPLETE
AV Mean grad: 12.8 mmHg
AV Peak grad: 25 mmHg
Ao pk vel: 2.5 m/s
S' Lateral: 3.7 cm

## 2021-01-27 ENCOUNTER — Ambulatory Visit (INDEPENDENT_AMBULATORY_CARE_PROVIDER_SITE_OTHER): Payer: Medicare Other | Admitting: General Practice

## 2021-01-27 ENCOUNTER — Other Ambulatory Visit: Payer: Self-pay

## 2021-01-27 DIAGNOSIS — Z7901 Long term (current) use of anticoagulants: Secondary | ICD-10-CM

## 2021-01-27 LAB — POCT INR: INR: 2 (ref 2.0–3.0)

## 2021-01-27 NOTE — Patient Instructions (Addendum)
Pre visit review using our clinic review tool, if applicable. No additional management support is needed unless otherwise documented below in the visit note.  Resume taking 1/2 tablet daily except 1 tablet on Mondays and Fridays.  Re-check in 6 weeks.

## 2021-01-27 NOTE — Progress Notes (Signed)
Medical screening examination/treatment/procedure(s) were performed by non-physician practitioner and as supervising physician I was immediately available for consultation/collaboration. I agree with above. Delonta Yohannes, MD   

## 2021-02-02 ENCOUNTER — Telehealth: Payer: Self-pay | Admitting: Pulmonary Disease

## 2021-02-02 DIAGNOSIS — R0602 Shortness of breath: Secondary | ICD-10-CM

## 2021-02-02 DIAGNOSIS — R042 Hemoptysis: Secondary | ICD-10-CM

## 2021-02-02 MED ORDER — AMOXICILLIN-POT CLAVULANATE 875-125 MG PO TABS: 1.0000 | ORAL_TABLET | Freq: Two times a day (BID) | ORAL | 0 refills | Status: DC

## 2021-02-02 NOTE — Telephone Encounter (Signed)
Called and spoke with pt who states that he is still having hemoptysis. States it is better after this past round of augmentin that we was on. Pt said he finished abx today 6/8.  States due to still having hemoptysis some, pt is wanting to know if the abx can be extended for another week or two to see if that will fully get it cleared up.  Dr. Erin Fulling, please advise.

## 2021-02-02 NOTE — Telephone Encounter (Signed)
LMTCB

## 2021-02-02 NOTE — Telephone Encounter (Signed)
Per Dr Erin Fulling via secure chat:  please send in a 4 week course of augmentin 875mg  BID. Schedule him for CT Chest non-contrast in early July with a follow up visit after the CT chest. thanks   Called and spoke with the pt. I advised of the above recs and he verbalized understanding. I have sent the rx for augmentin. Ordered CT. Scheduled him for ov with JD on 03/04/21 with cCT to be done shortly before.

## 2021-02-17 ENCOUNTER — Other Ambulatory Visit: Payer: Self-pay | Admitting: Cardiology

## 2021-02-17 ENCOUNTER — Other Ambulatory Visit: Payer: Self-pay | Admitting: Internal Medicine

## 2021-02-17 DIAGNOSIS — R609 Edema, unspecified: Secondary | ICD-10-CM

## 2021-02-21 ENCOUNTER — Ambulatory Visit (HOSPITAL_BASED_OUTPATIENT_CLINIC_OR_DEPARTMENT_OTHER)
Admission: RE | Admit: 2021-02-21 | Discharge: 2021-02-21 | Disposition: A | Discharge status: Home / Self Care | Payer: Medicare Other | Source: Ambulatory Visit | Attending: Pulmonary Disease | Admitting: Pulmonary Disease

## 2021-02-21 ENCOUNTER — Other Ambulatory Visit: Payer: Self-pay

## 2021-02-21 DIAGNOSIS — J439 Emphysema, unspecified: Secondary | ICD-10-CM | POA: Diagnosis not present

## 2021-02-21 DIAGNOSIS — R042 Hemoptysis: Secondary | ICD-10-CM | POA: Diagnosis not present

## 2021-02-21 DIAGNOSIS — R0602 Shortness of breath: Secondary | ICD-10-CM | POA: Insufficient documentation

## 2021-02-21 DIAGNOSIS — J90 Pleural effusion, not elsewhere classified: Secondary | ICD-10-CM | POA: Diagnosis not present

## 2021-02-21 DIAGNOSIS — R911 Solitary pulmonary nodule: Secondary | ICD-10-CM | POA: Diagnosis not present

## 2021-02-21 DIAGNOSIS — I700 Atherosclerosis of aorta: Secondary | ICD-10-CM | POA: Diagnosis not present

## 2021-02-24 ENCOUNTER — Other Ambulatory Visit (HOSPITAL_BASED_OUTPATIENT_CLINIC_OR_DEPARTMENT_OTHER): Payer: Self-pay | Admitting: Internal Medicine

## 2021-02-24 ENCOUNTER — Telehealth: Payer: Self-pay | Admitting: Emergency Medicine

## 2021-02-24 ENCOUNTER — Other Ambulatory Visit: Payer: Self-pay | Admitting: General Practice

## 2021-02-24 ENCOUNTER — Other Ambulatory Visit: Payer: Self-pay | Admitting: Emergency Medicine

## 2021-02-24 ENCOUNTER — Telehealth: Payer: Self-pay | Admitting: General Practice

## 2021-02-24 DIAGNOSIS — R911 Solitary pulmonary nodule: Secondary | ICD-10-CM

## 2021-02-24 DIAGNOSIS — Z7901 Long term (current) use of anticoagulants: Secondary | ICD-10-CM

## 2021-02-24 MED ORDER — ENOXAPARIN SODIUM 120 MG/0.8ML IJ SOSY: 1.5000 mg/kg | PREFILLED_SYRINGE | INTRAMUSCULAR | 0 refills | Status: DC

## 2021-02-24 NOTE — Telephone Encounter (Signed)
Instructions for coumadin and Lovenox pre and post procedure on 7/11.  See below.  7/6 - Last dose of coumadin until after procedure 7/7 - Nothing (Do not take coumadin or Lovenox) 7/8 - Take Lovenox X 1 in the AM  7/9 - Take Lovenox X 1 in the AM 7/10 - Take Lovenox X 1 in the AM 7/11 - Procedure (DO NOT TAKE LOVENOX TODAY!) 7/12 - Lovenox X 1 in the AM and 1 1/2 tablets of coumadin 7/13 - Lovenox X 1 in the AM and 1 1/2 tablets of coumadin 7/14 - Lovenox X 1 in the AM and 1 1/2 tablets of coumadin 7/15 - Lovenox X 1 in the AM and 1 1/2 tablets of coumadin  7/16 - Stop Lovenox and continue coumadin 1/2 tablet daily except 1 tablet on Mondays and Fridays.    7/19 - Check INR

## 2021-02-24 NOTE — Progress Notes (Signed)
Lauren from [facility] called to have [name] to have this patient come off coumadin before his procedure on 07/11.   Patient will need enoxaparin bridge to be off coumadin   If you have any questions please contact [place] with Dr. Darlin Drop [facility] [phone]

## 2021-02-24 NOTE — Telephone Encounter (Signed)
Called Dr. Isaac Bliss office as she manages his coumadin.  I received a call back from [name] saying she received my message and would take care of it.  Any questions to call [phone]  Nothing further needed

## 2021-02-24 NOTE — Progress Notes (Signed)
Working on setting up [place], goal 7/11. Will need enoxaparin bridge to be off coumadin

## 2021-03-02 ENCOUNTER — Telehealth: Payer: Self-pay | Admitting: Pulmonary Disease

## 2021-03-02 NOTE — Telephone Encounter (Signed)
-----   Message from Erline Hau, MD sent at 03/01/2021  9:20 AM EDT ----- Regarding: RE: Lovenox bridge Yes, ok for lovenox bridge. ----- Message ----- From: Warden Fillers, RN Sent: 02/24/2021   2:32 PM EDT To: Erline Hau, MD Subject: Lovenox bridge                                 Hi Dr. Jerilee Hoh,  Patient is having a bronchoscopy with endo-bronchial navigation on 7/11.  Patient will need to stop warfarin for 5 days prior to procedure and will need a Lovenox bridge per Dr. Lamonte Sakai.  #See note from 6/30.  Just need your permission to dose the Lovenox and send to pharmacy.  Thanks, [name], RN

## 2021-03-02 NOTE — Telephone Encounter (Signed)
disregard

## 2021-03-02 NOTE — Telephone Encounter (Signed)
Patient is aware of below message and voiced her understanding.  Nothing further needed at time.

## 2021-03-02 NOTE — Telephone Encounter (Signed)
Spoke to patient, who is calling for instructions for 03/07/2021 procedure. Patient is aware of arrival time but states he was not given any further instructions.

## 2021-03-03 ENCOUNTER — Encounter (HOSPITAL_COMMUNITY): Payer: Self-pay | Admitting: Emergency Medicine

## 2021-03-03 ENCOUNTER — Other Ambulatory Visit: Payer: Self-pay

## 2021-03-03 NOTE — Progress Notes (Addendum)
Mr. Mcquarrie denies chest pain or shortness of breath. Patient reports that he only becomes short of breath when he is walking outside in the  [place]. Mr. Presley denies any s/s of Covid in his home and is unaware of any exposures. Patient is going to have Covid test  tomorrow. I instructed patient that he will need to wear a mask when is is out and around anyone.  Mr. Whitby reported that he has a friend that is coming this weekend to stay drive patient to and from the hospital and to stay with him for 24 hours after the procedure. I informed patient that they will need to wear mask and remain 6 feet apart.   I asked Mr. Omlor when he is to stop Coumadin and when does he start Lovenox.  Patient said that he took the last dose of Coumadin today and he will start Lovenox in the am. Patient paused , and said "let me check my paper, " Mr. Scurlock read it to me, stop Coumadin on 03/02/21, begin Lovenox on 03/04/21.I encouraged Mr. Nodarse to call the person who gave him the prescription and tell them take you took Coumadin and ask if he should start Lovenox tomorrow. I sent Dr. Lamonte Sakai a staff message and I spoke with Joanna Puff at [facility] office. Jenny Reichmann will call patient and instructed him to not take Lovenox on Friday.  Mr. Heiland said that he will only take Allopurinol, Amlodipine, Tamsulosin, on the Monday am. I will not take Tramadol or Prednisone. I instructed patient to not take any more Ibuprophen or vitamins, until after procedure.

## 2021-03-04 ENCOUNTER — Ambulatory Visit: Payer: Medicare Other | Admitting: Pulmonary Disease

## 2021-03-04 ENCOUNTER — Other Ambulatory Visit (HOSPITAL_COMMUNITY)
Admission: RE | Admit: 2021-03-04 | Discharge: 2021-03-04 | Disposition: A | Discharge status: Home / Self Care | Payer: Medicare Other | Source: Ambulatory Visit | Attending: Emergency Medicine | Admitting: Emergency Medicine

## 2021-03-04 DIAGNOSIS — Z01812 Encounter for preprocedural laboratory examination: Secondary | ICD-10-CM | POA: Diagnosis not present

## 2021-03-04 DIAGNOSIS — Z20822 Contact with and (suspected) exposure to covid-19: Secondary | ICD-10-CM | POA: Diagnosis not present

## 2021-03-04 LAB — SARS CORONAVIRUS 2 (TAT 6-24 HRS): SARS Coronavirus 2: NEGATIVE

## 2021-03-07 ENCOUNTER — Ambulatory Visit (HOSPITAL_COMMUNITY): Payer: Medicare Other

## 2021-03-07 ENCOUNTER — Ambulatory Visit (HOSPITAL_COMMUNITY): Payer: Medicare Other | Admitting: Anesthesiology

## 2021-03-07 ENCOUNTER — Other Ambulatory Visit: Payer: Self-pay | Admitting: Internal Medicine

## 2021-03-07 ENCOUNTER — Encounter (HOSPITAL_COMMUNITY)
Admission: RE | Disposition: A | Discharge status: Home / Self Care | Payer: Self-pay | Source: Home / Self Care | Attending: Emergency Medicine

## 2021-03-07 ENCOUNTER — Encounter (HOSPITAL_COMMUNITY): Payer: Self-pay | Admitting: Emergency Medicine

## 2021-03-07 ENCOUNTER — Ambulatory Visit (HOSPITAL_COMMUNITY)
Admission: RE | Admit: 2021-03-07 | Discharge: 2021-03-07 | Disposition: A | Discharge status: Home / Self Care | Payer: Medicare Other | Attending: Emergency Medicine | Admitting: Emergency Medicine

## 2021-03-07 ENCOUNTER — Other Ambulatory Visit: Payer: Self-pay

## 2021-03-07 DIAGNOSIS — Z9889 Other specified postprocedural states: Secondary | ICD-10-CM | POA: Diagnosis not present

## 2021-03-07 DIAGNOSIS — Z7952 Long term (current) use of systemic steroids: Secondary | ICD-10-CM | POA: Insufficient documentation

## 2021-03-07 DIAGNOSIS — Z833 Family history of diabetes mellitus: Secondary | ICD-10-CM | POA: Insufficient documentation

## 2021-03-07 DIAGNOSIS — Z87891 Personal history of nicotine dependence: Secondary | ICD-10-CM | POA: Diagnosis not present

## 2021-03-07 DIAGNOSIS — Z7901 Long term (current) use of anticoagulants: Secondary | ICD-10-CM | POA: Diagnosis not present

## 2021-03-07 DIAGNOSIS — C3411 Malignant neoplasm of upper lobe, right bronchus or lung: Secondary | ICD-10-CM | POA: Insufficient documentation

## 2021-03-07 DIAGNOSIS — I4891 Unspecified atrial fibrillation: Secondary | ICD-10-CM | POA: Diagnosis not present

## 2021-03-07 DIAGNOSIS — Z823 Family history of stroke: Secondary | ICD-10-CM | POA: Diagnosis not present

## 2021-03-07 DIAGNOSIS — E876 Hypokalemia: Secondary | ICD-10-CM | POA: Diagnosis not present

## 2021-03-07 DIAGNOSIS — J9811 Atelectasis: Secondary | ICD-10-CM | POA: Diagnosis not present

## 2021-03-07 DIAGNOSIS — G4700 Insomnia, unspecified: Secondary | ICD-10-CM

## 2021-03-07 DIAGNOSIS — I517 Cardiomegaly: Secondary | ICD-10-CM | POA: Diagnosis not present

## 2021-03-07 DIAGNOSIS — Z79899 Other long term (current) drug therapy: Secondary | ICD-10-CM | POA: Diagnosis not present

## 2021-03-07 DIAGNOSIS — Z791 Long term (current) use of non-steroidal anti-inflammatories (NSAID): Secondary | ICD-10-CM | POA: Diagnosis not present

## 2021-03-07 DIAGNOSIS — E559 Vitamin D deficiency, unspecified: Secondary | ICD-10-CM | POA: Diagnosis not present

## 2021-03-07 DIAGNOSIS — R911 Solitary pulmonary nodule: Secondary | ICD-10-CM | POA: Diagnosis not present

## 2021-03-07 DIAGNOSIS — R042 Hemoptysis: Secondary | ICD-10-CM | POA: Diagnosis present

## 2021-03-07 DIAGNOSIS — Z952 Presence of prosthetic heart valve: Secondary | ICD-10-CM | POA: Diagnosis not present

## 2021-03-07 DIAGNOSIS — R918 Other nonspecific abnormal finding of lung field: Secondary | ICD-10-CM | POA: Diagnosis present

## 2021-03-07 DIAGNOSIS — R846 Abnormal cytological findings in specimens from respiratory organs and thorax: Secondary | ICD-10-CM | POA: Diagnosis not present

## 2021-03-07 DIAGNOSIS — R609 Edema, unspecified: Secondary | ICD-10-CM

## 2021-03-07 DIAGNOSIS — Z419 Encounter for procedure for purposes other than remedying health state, unspecified: Secondary | ICD-10-CM

## 2021-03-07 HISTORY — DX: Personal history of other diseases of the digestive system: Z87.19

## 2021-03-07 HISTORY — PX: BRONCHIAL NEEDLE ASPIRATION BIOPSY: SHX5106

## 2021-03-07 HISTORY — PX: BRONCHIAL WASHINGS: SHX5105

## 2021-03-07 HISTORY — PX: BRONCHIAL BIOPSY: SHX5109

## 2021-03-07 HISTORY — PX: VIDEO BRONCHOSCOPY WITH ENDOBRONCHIAL NAVIGATION: SHX6175

## 2021-03-07 HISTORY — PX: BRONCHIAL BRUSHINGS: SHX5108

## 2021-03-07 HISTORY — PX: FIDUCIAL MARKER PLACEMENT: SHX6858

## 2021-03-07 LAB — PROTIME-INR
INR: 1.2 (ref 0.8–1.2)
Prothrombin Time: 14.8 seconds (ref 11.4–15.2)

## 2021-03-07 LAB — CBC
HCT: 37.8 % — ABNORMAL LOW (ref 39.0–52.0)
Hemoglobin: 12.4 g/dL — ABNORMAL LOW (ref 13.0–17.0)
MCH: 33.1 pg (ref 26.0–34.0)
MCHC: 32.8 g/dL (ref 30.0–36.0)
MCV: 100.8 fL — ABNORMAL HIGH (ref 80.0–100.0)
Platelets: 186 10*3/uL (ref 150–400)
RBC: 3.75 MIL/uL — ABNORMAL LOW (ref 4.22–5.81)
RDW: 13.9 % (ref 11.5–15.5)
WBC: 4.9 10*3/uL (ref 4.0–10.5)
nRBC: 0 % (ref 0.0–0.2)

## 2021-03-07 LAB — BASIC METABOLIC PANEL
Anion gap: 8 (ref 5–15)
BUN: 17 mg/dL (ref 8–23)
CO2: 25 mmol/L (ref 22–32)
Calcium: 8.6 mg/dL — ABNORMAL LOW (ref 8.9–10.3)
Chloride: 104 mmol/L (ref 98–111)
Creatinine, Ser: 0.96 mg/dL (ref 0.61–1.24)
GFR, Estimated: >60 mL/min (ref >60)
Glucose, Bld: 86 mg/dL (ref 70–99)
Potassium: 3.6 mmol/L (ref 3.5–5.1)
Sodium: 137 mmol/L (ref 135–145)

## 2021-03-07 SURGERY — VIDEO BRONCHOSCOPY WITH ENDOBRONCHIAL NAVIGATION
Anesthesia: General | Laterality: Right

## 2021-03-07 MED ORDER — LIDOCAINE 2% (20 MG/ML) 5 ML SYRINGE: INTRAMUSCULAR | Status: DC | PRN

## 2021-03-07 MED ORDER — DEXAMETHASONE SODIUM PHOSPHATE 10 MG/ML IJ SOLN
INTRAMUSCULAR | Status: DC | PRN
  Administered 2021-03-07: 4 mg via INTRAVENOUS

## 2021-03-07 MED ORDER — ONDANSETRON HCL 4 MG/2ML IJ SOLN
INTRAMUSCULAR | Status: DC | PRN
  Administered 2021-03-07: 4 mg via INTRAVENOUS

## 2021-03-07 MED ORDER — FUROSEMIDE 40 MG PO TABS: 40.0000 mg | ORAL_TABLET | Freq: Every day | ORAL | Status: DC

## 2021-03-07 MED ORDER — FENTANYL CITRATE (PF) 250 MCG/5ML IJ SOLN
INTRAMUSCULAR | Status: DC | PRN
  Administered 2021-03-07: 50 ug via INTRAVENOUS

## 2021-03-07 MED ORDER — ALLOPURINOL 300 MG PO TABS: 300.0000 mg | ORAL_TABLET | Freq: Every day | ORAL | Status: DC

## 2021-03-07 MED ORDER — ACETAMINOPHEN 500 MG PO TABS
1000.0000 mg | ORAL_TABLET | Freq: Once | ORAL | Status: AC
  Administered 2021-03-07: 1000 mg via ORAL
  Filled 2021-03-07: qty 2

## 2021-03-07 MED ORDER — TAMSULOSIN HCL 0.4 MG PO CAPS: 0.4000 mg | ORAL_CAPSULE | Freq: Every day | ORAL | Status: DC

## 2021-03-07 MED ORDER — LIDOCAINE 2% (20 MG/ML) 5 ML SYRINGE
INTRAMUSCULAR | Status: DC | PRN
  Administered 2021-03-07: 60 mg via INTRAVENOUS

## 2021-03-07 MED ORDER — ROCURONIUM BROMIDE 10 MG/ML (PF) SYRINGE
PREFILLED_SYRINGE | INTRAVENOUS | Status: DC | PRN
  Administered 2021-03-07: 20 mg via INTRAVENOUS
  Administered 2021-03-07: 50 mg via INTRAVENOUS

## 2021-03-07 MED ORDER — PHENYLEPHRINE 40 MCG/ML (10ML) SYRINGE FOR IV PUSH (FOR BLOOD PRESSURE SUPPORT)
PREFILLED_SYRINGE | INTRAVENOUS | Status: DC | PRN
  Administered 2021-03-07 (×2): 80 ug via INTRAVENOUS
  Administered 2021-03-07: 40 ug via INTRAVENOUS
  Administered 2021-03-07: 80 ug via INTRAVENOUS

## 2021-03-07 MED ORDER — LACTATED RINGERS IV SOLN: INTRAVENOUS | Status: DC

## 2021-03-07 MED ORDER — TRAZODONE HCL 50 MG PO TABS
25.0000 mg | ORAL_TABLET | Freq: Every evening | ORAL | Status: DC | PRN
Start: 2021-03-07 — End: 2021-05-24

## 2021-03-07 MED ORDER — AMLODIPINE BESYLATE 5 MG PO TABS: 5.0000 mg | ORAL_TABLET | Freq: Every day | ORAL | Status: DC

## 2021-03-07 MED ORDER — CHLORHEXIDINE GLUCONATE 0.12 % MT SOLN
OROMUCOSAL | Status: AC
  Administered 2021-03-07: 15 mL via OROMUCOSAL
  Filled 2021-03-07: qty 15

## 2021-03-07 MED ORDER — PROPOFOL 10 MG/ML IV BOLUS
INTRAVENOUS | Status: DC | PRN
  Administered 2021-03-07: 140 mg via INTRAVENOUS

## 2021-03-07 MED ORDER — SUGAMMADEX SODIUM 200 MG/2ML IV SOLN
INTRAVENOUS | Status: DC | PRN
  Administered 2021-03-07: 200 mg via INTRAVENOUS

## 2021-03-07 MED ORDER — TRIAMCINOLONE ACETONIDE 0.1 % EX CREA: 1.0000 "application " | TOPICAL_CREAM | Freq: Two times a day (BID) | CUTANEOUS | Status: DC | PRN

## 2021-03-07 MED ORDER — WARFARIN SODIUM 5 MG PO TABS: 5.0000 mg | ORAL_TABLET | Freq: Once | ORAL | Status: DC

## 2021-03-07 MED ORDER — FENTANYL CITRATE (PF) 100 MCG/2ML IJ SOLN: 25.0000 ug | INTRAMUSCULAR | Status: DC | PRN

## 2021-03-07 MED ORDER — CHLORHEXIDINE GLUCONATE 0.12 % MT SOLN: 15.0000 mL | Freq: Once | OROMUCOSAL | Status: AC

## 2021-03-07 MED ORDER — PREDNISONE 20 MG PO TABS: 10.0000 mg | ORAL_TABLET | Freq: Every day | ORAL | Status: DC

## 2021-03-07 SURGICAL SUPPLY — 2 items
superlock fiducial marker (Tissue) ×3 IMPLANT
superlock fudicial markers ×6 IMPLANT

## 2021-03-07 NOTE — Op Note (Addendum)
Video Bronchoscopy with Electromagnetic Navigation Procedure Note  Date of Operation: 03/07/2021  Pre-op Diagnosis: Right lower lobe opacity, right upper lobe pulmonary nodules  Post-op Diagnosis: Same  Surgeon: Baltazar Apo  Assistants: None  Anesthesia: General endotracheal anesthesia  Operation: Flexible video fiberoptic bronchoscopy with electromagnetic navigation and biopsies.  Estimated Blood Loss: 25 cc  Complications: None apparent  Indications and History: [name] is a 85 y.o. male with remote history of tobacco, under evaluation for several months of low volume hemoptysis.  CT scan of his chest shows right lower lobe basilar opacity, right upper lobe pulmonary nodules.  Recommendation was made to achieve tissue diagnosis via navigational bronchoscopy.  The risks, benefits, complications, treatment options and expected outcomes were discussed with the patient.  The possibilities of pneumothorax, pneumonia, reaction to medication, pulmonary aspiration, perforation of a viscus, bleeding, failure to diagnose a condition and creating a complication requiring transfusion or operation were discussed with the patient who freely signed the consent.    Description of Procedure: The patient was seen in the Preoperative Area, was examined and was deemed appropriate to proceed.  The patient was taken to [facility] endoscopy room 2, identified as [name] and the procedure verified as Flexible Video Fiberoptic Bronchoscopy.  A Time Out was held and the above information confirmed.   Prior to the date of the procedure a high-resolution CT scan of the chest was performed. Utilizing [place] a virtual tracheobronchial tree was generated to allow the creation of distinct navigation pathways to the patient's parenchymal abnormalities. After being taken to the operating room general anesthesia was initiated and the patient  was orally intubated.   Glidescope view showed  a widely patent glottis with a small red/brown raised lesion (? Scar) just lateral to the L cord. No active bleeding at this location. No airway occlusion  The video fiberoptic bronchoscope was introduced via the endotracheal tube and a general inspection was performed which showed no endobronchial lesions but prominent mucosal vasculature with some dilated superficial vessels especially in the right upper lobe, left upper lobe, right lower lobe.  There was no active bleeding.  There was possibly some old blood emanating from the right superior segmental airway.  The extendable working channel and locator guide were introduced into the bronchoscope. The distinct navigation pathways prepared prior to this procedure were then utilized to navigate to within 0.2 cm of patient's lesion right lower lobe opacity (target 1) and the larger right upper lobe pulmonary nodule (target 2) identified on CT scan. The extendable working channel was secured into place at each location and the locator guide was withdrawn. Under fluoroscopic guidance transbronchial needle brushings, transbronchial Wang needle biopsies, and transbronchial forceps biopsies were performed at both locations to be sent for cytology and pathology. A bronchioalveolar lavage was performed in the right lower lobe and right upper lobe and sent for cytology and microbiology (bacterial, fungal, AFB smears and cultures).  3 fiducial markers were placed triangulating the right upper lobe pulmonary nodule to facilitate radiation therapy should this be indicated going forward.  At the end of the procedure a general airway inspection was performed and there was no evidence of active bleeding. The bronchoscope was removed.  The patient tolerated the procedure well. There was no significant blood loss and there were no obvious complications. A post-procedural chest x-ray is pending.  Samples: 1. Transbronchial needle brushings from right lower lobe opacity (target  1) 2. Transbronchial Wang needle biopsies from right lower lobe  opacity 3. Transbronchial forceps biopsies from right lower lobe opacity 4. Bronchoalveolar lavage from lower lobe  5. Transbronchial needle brushings from right upper lobe nodule (target 2) 6. Transbronchial Wang needle biopsies from right upper lobe nodule 7. Transbronchial forceps biopsies from right upper lobe nodule 8. Bronchoalveolar lavage from right upper lobe   Plans:  The patient will be discharged from the PACU to home when recovered from anesthesia and after chest x-ray is reviewed. We will review the cytology, pathology and microbiology results with the patient when they become available. Outpatient followup will be with Dr Erin Fulling or Dr Lamonte Sakai.     Baltazar Apo, MD, PhD 03/07/2021, 12:09 PM [facility] [phone] or if no answer before 7:00PM call [phone] For any issues after 7:00PM please call eLink [phone]

## 2021-03-07 NOTE — Anesthesia Preprocedure Evaluation (Addendum)
Anesthesia Evaluation  Patient identified by MRN, date of birth, ID band Patient awake    Reviewed: Allergy & Precautions, H&P , NPO status , Patient's Chart, lab work & pertinent test results  Airway Mallampati: III  TM Distance: >3 FB Neck ROM: Full    Dental no notable dental hx. (+) Teeth Intact, Dental Advisory Given   Pulmonary neg pulmonary ROS, former smoker,    Pulmonary exam normal breath sounds clear to auscultation       Cardiovascular hypertension, Pt. on medications + dysrhythmias Atrial Fibrillation  Rhythm:Regular Rate:Normal     Neuro/Psych Anxiety negative neurological ROS     GI/Hepatic Neg liver ROS, hiatal hernia,   Endo/Other  negative endocrine ROS  Renal/GU negative Renal ROS  negative genitourinary   Musculoskeletal  (+) Arthritis , Osteoarthritis,    Abdominal   Peds  Hematology negative hematology ROS (+)   Anesthesia Other Findings   Reproductive/Obstetrics negative OB ROS                            Anesthesia Physical Anesthesia Plan  ASA: 3  Anesthesia Plan: General   Post-op Pain Management:    Induction: Intravenous  PONV Risk Score and Plan: 3 and Ondansetron, Dexamethasone and Treatment may vary due to age or medical condition  Airway Management Planned: Oral ETT  Additional Equipment:   Intra-op Plan:   Post-operative Plan: Extubation in OR  Informed Consent: I have reviewed the patients History and Physical, chart, labs and discussed the procedure including the risks, benefits and alternatives for the proposed anesthesia with the patient or authorized representative who has indicated his/her understanding and acceptance.     Dental advisory given  Plan Discussed with: CRNA  Anesthesia Plan Comments:         Anesthesia Quick Evaluation

## 2021-03-07 NOTE — Transfer of Care (Signed)
Immediate Anesthesia Transfer of Care Note  Patient: [name]  Procedure(s) Performed: VIDEO BRONCHOSCOPY WITH ENDOBRONCHIAL NAVIGATION (Right) BRONCHIAL BIOPSIES BRONCHIAL BRUSHINGS BRONCHIAL WASHINGS FIDUCIAL MARKER PLACEMENT BRONCHIAL NEEDLE ASPIRATION BIOPSIES  Patient Location: PACU  Anesthesia Type:General  Level of Consciousness: awake, alert , oriented and patient cooperative  Airway & Oxygen Therapy: Patient Spontanous Breathing and Patient connected to nasal cannula oxygen  Post-op Assessment: Report given to RN and Post -op Vital signs reviewed and stable  Post vital signs: Reviewed  Last Vitals:  Vitals Value Taken Time  BP    Temp    Pulse 102 03/07/21 1230  Resp 22 03/07/21 1230  SpO2 95 % 03/07/21 1230  Vitals shown include unvalidated device data.  Last Pain:  Vitals:   03/07/21 0725  TempSrc:   PainSc: 0-No pain         Complications: No notable events documented.

## 2021-03-07 NOTE — Discharge Instructions (Addendum)
Flexible Bronchoscopy, Care After This sheet gives you information about how to care for yourself after your test. Your doctor may also give you more specific instructions. If you have problems or questions, contact your doctor. Follow these instructions at home: Eating and drinking Do not eat or drink anything (not even water) for 2 hours after your test, or until your numbing medicine (local anesthetic) wears off. When your numbness is gone and your cough and gag reflexes have come back, you may: Eat only soft foods. Slowly drink liquids. The day after the test, go back to your normal diet. Driving Do not drive for 24 hours if you were given a medicine to help you relax (sedative). Do not drive or use heavy machinery while taking prescription pain medicine. General instructions  Take over-the-counter and prescription medicines only as told by your doctor. Return to your normal activities as told. Ask what activities are safe for you. Do not use any products that have nicotine or tobacco in them. This includes cigarettes and e-cigarettes. If you need help quitting, ask your doctor. Keep all follow-up visits as told by your doctor. This is important. It is very important if you had a tissue sample (biopsy) taken. Get help right away if: You have shortness of breath that gets worse. You get light-headed. You feel like you are going to pass out (faint). You have chest pain. You cough up: More than a little blood. More blood than before. Summary Do not eat or drink anything (not even water) for 2 hours after your test, or until your numbing medicine wears off. Do not use cigarettes. Do not use e-cigarettes. Get help right away if you have chest pain.   Please call our office for any questions or concerns.  [phone].  Restart your enoxaparin on 03/08/2021 as directed.  Follow-up with anticoagulation clinic as planned.  This information is not intended to replace advice given to you  by your health care provider. Make sure you discuss any questions you have with your health care provider. Document Released: 06/11/2009 Document Revised: 07/27/2017 Document Reviewed: 09/01/2016 Elsevier Patient Education  2020 [employer].

## 2021-03-07 NOTE — Anesthesia Procedure Notes (Signed)
Procedure Name: Intubation Date/Time: 03/07/2021 10:45 AM Performed by: Jenne Campus, CRNA Pre-anesthesia Checklist: Patient identified, Emergency Drugs available, Suction available and Patient being monitored Patient Re-evaluated:Patient Re-evaluated prior to induction Oxygen Delivery Method: Circle System Utilized Preoxygenation: Pre-oxygenation with 100% oxygen Induction Type: IV induction Ventilation: Mask ventilation without difficulty Laryngoscope Size: Glidescope and 3 Grade View: Grade I Tube type: Oral Tube size: 7.5 mm Number of attempts: 1 Airway Equipment and Method: Stylet, Oral airway and Video-laryngoscopy Placement Confirmation: ETT inserted through vocal cords under direct vision, positive ETCO2 and breath sounds checked- equal and bilateral Secured at: 21 cm Tube secured with: Tape Dental Injury: Teeth and Oropharynx as per pre-operative assessment  Comments: Elective video-glide intubation.

## 2021-03-07 NOTE — H&P (Signed)
[name] is an 85 y.o. male.   Chief Complaint: Hemoptysis, abnormal CT chest HPI: 86 year old former smoker with a history of atrial fibrillation and aortic valve repair on anticoagulation.  Also eczema, vocal cord polyp status post resection. He has been experiencing persistent small-volume hemoptysis for over 6 months.  He underwent an ENT evaluation that was unrevealing, then bronchoscopy by Dr. Erin Fulling in our office that did not reveal a bleeding source. He has had CT scans of the chest that have identified a right lower lobe rounded opacity with some air bronchograms of unclear etiology.  Minimal change on serial films.  Also there is a 11 x 9 mm right apical groundglass pulmonary nodule with adjacent satellite lesion approximately 4 mm.  He presents now for navigational bronchoscopy under general anesthesia to further evaluate his CT abnormalities, hemoptysis.  He has undergone enoxaparin bridge, is off his Coumadin.  Last dose of enoxaparin was 7/10.  Past Medical History:  Diagnosis Date   Aortic stenosis    Atrial fibrillation (HCC)    Baker cyst    Left knee   DJD (degenerative joint disease)    knees   Dysrhythmia    Eczema    Erectile dysfunction    Gout    History of hiatal hernia    HTN (hypertension)    MVP (mitral valve prolapse)    Shortness of breath dyspnea    with activity    Past Surgical History:  Procedure Laterality Date    polyps vocal cord  1994   AORTIC VALVE REPLACEMENT  ~2004   BRONCHIAL WASHINGS  09/21/2020   Procedure: BRONCHIAL WASHINGS;  Surgeon: Freddi Starr, MD;  Location: [facility];  Service: Pulmonary;;  BAL    HERNIA REPAIR  ~1978   HERNIA REPAIR  ~1983   LUMBAR LAMINECTOMY/DECOMPRESSION MICRODISCECTOMY N/A 01/15/2015   Procedure: 2 LEVEL DECOMPRESSIVE LUMBAR LAMINECTOMY L3-L4,L4-L5;  Surgeon: Latanya Maudlin, MD;  Location: [facility];  Service: Orthopedics;  Laterality: N/A;   LUMBAR LAMINECTOMY/DECOMPRESSION MICRODISCECTOMY N/A  05/01/2017   Procedure: Lumbar one-Lumbar two, Lumbar two-Lumbar three, Lumbar three-Lumbar four Laminectomy; Evacuation of hematoma;  Surgeon: Consuella Lose, MD;  Location: [facility];  Service: Neurosurgery;  Laterality: N/A;   right total hip  ~4 years ago   [place] Left ~2009   TOTAL KNEE ARTHROPLASTY Left 04/14/2015   Procedure: LEFT TOTAL KNEE ARTHROPLASTY;  Surgeon: Latanya Maudlin, MD;  Location: [facility];  Service: Orthopedics;  Laterality: Left;   VIDEO BRONCHOSCOPY N/A 09/21/2020   Procedure: VIDEO BRONCHOSCOPY WITHOUT FLUORO;  Surgeon: Freddi Starr, MD;  Location: [facility];  Service: Pulmonary;  Laterality: N/A;    Family History  Problem Relation Age of Onset   Diabetes Other    Stroke Other    Social History:  reports that he quit smoking about 18 years ago. His smoking use included cigarettes. He has never used smokeless tobacco. He reports current alcohol use of about 3.0 standard drinks of alcohol per week. He reports that he does not use drugs.  Allergies: No Known Allergies  Medications Prior to Admission  Medication Sig Dispense Refill   allopurinol (ZYLOPRIM) 300 MG tablet Take 1 tablet by mouth once daily (Patient taking differently: Take 300 mg by mouth daily.) 90 tablet 0   amLODipine (NORVASC) 5 MG tablet Take 1 tablet by mouth once daily (Patient taking differently: Take 5 mg by mouth daily.) 90 tablet 0   amoxicillin-clavulanate (AUGMENTIN) 875-125 MG tablet Take 1 tablet by mouth  2 (two) times daily. 60 tablet 0   Cholecalciferol 50 MCG (2000 UT) CAPS Take 2,000 Units by mouth daily.     ferrous sulfate 325 (65 FE) MG tablet Take 325 mg by mouth 2 (two) times daily with a meal.     furosemide (LASIX) 40 MG tablet Take 1 tablet by mouth once daily (Patient taking differently: Take 40 mg by mouth daily.) 90 tablet 1   Ibuprofen 200 MG CAPS Take 400 mg by mouth every 6 (six) hours as needed for fever.     potassium chloride SA (KLOR-CON) 20 MEQ  tablet Take 1 tablet (20 mEq total) by mouth daily. 90 tablet 3   predniSONE (DELTASONE) 20 MG tablet TAKE 1/2 (ONE-HALF) TABLET BY MOUTH ONCE DAILY WITH BREAKFAST (Patient taking differently: Take 10 mg by mouth daily with breakfast.) 45 tablet 0   tamsulosin (FLOMAX) 0.4 MG CAPS capsule TAKE 1 CAPSULE BY MOUTH ONCE DAILY AFTER BREAKFAST (Patient taking differently: Take 0.4 mg by mouth daily after supper.) 90 capsule 0   traMADol (ULTRAM) 50 MG tablet TAKE 1/2 (ONE-HALF) TABLET BY MOUTH TWICE DAILY FOR  CHRONIC  PAIN (Patient taking differently: Take 50 mg by mouth 2 (two) times daily. FOR  CHRONIC  PAIN) 90 tablet 0   traZODone (DESYREL) 50 MG tablet Take 1 tablet (50 mg total) by mouth at bedtime as needed. (Patient taking differently: Take 25 mg by mouth at bedtime as needed for sleep.) 90 tablet 1   triamcinolone cream (KENALOG) 0.1 % CREAM TO AFFECTED AREA TWICE DAILY (Patient taking differently: Apply 1 application topically 2 (two) times daily as needed (itching). Around the nose) 80 g 3   warfarin (COUMADIN) 5 MG tablet Take 1/2 tablet daily except take 1 tablet on Mon and Friday or Take as directed by anticoagulation clinic (Patient taking differently: Take 5 mg by mouth one time only at 4 PM. Take 5 mg Monday and Friday all the other days take 2.5 mg Take as directed by anticoagulation clinic) 75 tablet 1   enoxaparin (LOVENOX) 120 MG/0.8ML injection Inject 0.76 mLs (114 mg total) into the skin daily for 7 doses. 5.32 mL 0   SYRINGE-NEEDLE, DISP, 3 ML (BD SAFETYGLIDE SYRINGE/NEEDLE) 25G X 1" 3 ML MISC Inject 75m in deltoid once weekly for 4 weeks, then inject 1 ml once a month thereafter 100 each 11    Results for orders placed or performed during the hospital encounter of 03/07/21 (from the past 48 hour(s))  Protime-INR     Status: None   Collection Time: 03/07/21  6:49 AM  Result Value Ref Range   Prothrombin Time 14.8 11.4 - 15.2 seconds   INR 1.2 0.8 - 1.2    Comment: (NOTE) INR  goal varies based on device and disease states. [facility]formed [facility] [address], [place] [address]  CBC per protocol     Status: Abnormal   Collection Time: 03/07/21  6:49 AM  Result Value Ref Range   WBC 4.9 4.0 - 10.5 K/uL   RBC 3.75 (L) 4.22 - 5.81 MIL/uL   Hemoglobin 12.4 (L) 13.0 - 17.0 g/dL   HCT 37.8 (L) 39.0 - 52.0 %   MCV 100.8 (H) 80.0 - 100.0 fL   MCH 33.1 26.0 - 34.0 pg   MCHC 32.8 30.0 - 36.0 g/dL   RDW 13.9 11.5 - 15.5 %   Platelets 186 150 - 400 K/uL   nRBC 0.0 0.0 - 0.2 %  Comment: Performed at [facility], [place] [address], [place], [place] [address]   No results found.  Review of Systems  Constitutional: Negative.   HENT: Negative.    Respiratory:  Positive for cough.        Hemoptysis, small-volume, daily.  Cardiovascular: Negative.    Blood pressure (!) 154/55, pulse 84, temperature 97.6 F (36.4 C), temperature source Oral, resp. rate 18, height _0  (1.676 m), weight 74.8 kg, SpO2 97 %. Physical Exam  Gen: Pleasant, well-nourished, in no distress,  normal affect  ENT: No lesions,  mouth clear,  oropharynx clear, no postnasal drip, M3 airway  Neck: No JVD, no stridor but hoarse voice  Lungs: No use of accessory muscles, no crackles or wheezing on normal respiration, no wheeze on forced expiration  Cardiovascular: RRR, soft 2/6 systolic murmur  Musculoskeletal: No deformities, no cyanosis or clubbing  Neuro: alert, awake, non focal  Skin: Warm, no lesions or rash  Assessment/Plan  Hemoptysis of unclear etiology.  He had a negative inspection bronchoscopy, question whether this is related to his anticoagulation.  Consider whether it may be related to his parenchymal abnormalities.  Pulmonary nodules.  Poorly formed right lower lobe opacity 2.7 cm with air bronchograms, unclear etiology, unclear whether this relates to his hemoptysis.  May be an area of rounded atelectasis from prior pneumonia.  The right apical  nodule was smaller, groundglass, concerning for possible adenocarcinoma.  Plan for navigational bronchoscopy to better characterize both lesions.  Will possibly place fiducial markers as well.  Collene Gobble, MD 03/07/2021, 7:53 AM

## 2021-03-07 NOTE — Anesthesia Postprocedure Evaluation (Signed)
Anesthesia Post Note  Patient: [name]  Procedure(s) Performed: VIDEO BRONCHOSCOPY WITH ENDOBRONCHIAL NAVIGATION (Right) BRONCHIAL BIOPSIES BRONCHIAL BRUSHINGS BRONCHIAL WASHINGS FIDUCIAL MARKER PLACEMENT BRONCHIAL NEEDLE ASPIRATION BIOPSIES     Patient location during evaluation: PACU Anesthesia Type: General Level of consciousness: awake and alert Pain management: pain level controlled Vital Signs Assessment: post-procedure vital signs reviewed and stable Respiratory status: spontaneous breathing, nonlabored ventilation and respiratory function stable Cardiovascular status: blood pressure returned to baseline and stable Postop Assessment: no apparent nausea or vomiting Anesthetic complications: no   No notable events documented.  Last Vitals:  Vitals:   03/07/21 1245 03/07/21 1250  BP: (!) 153/65   Pulse: 75 91  Resp: (!) 21 (!) 24  Temp:    SpO2: 93% 93%    Last Pain:  Vitals:   03/07/21 1300  TempSrc:   PainSc: 0-No pain                 Lucresia Simic,W. EDMOND

## 2021-03-08 LAB — ACID FAST SMEAR (AFB, MYCOBACTERIA)
Acid Fast Smear: NEGATIVE
Acid Fast Smear: NEGATIVE

## 2021-03-08 LAB — CYTOLOGY - NON PAP

## 2021-03-09 LAB — CULTURE, BAL-QUANTITATIVE W GRAM STAIN
Culture: NO GROWTH
Culture: NO GROWTH

## 2021-03-10 ENCOUNTER — Ambulatory Visit: Payer: Medicare Other

## 2021-03-11 ENCOUNTER — Telehealth: Payer: Self-pay | Admitting: Emergency Medicine

## 2021-03-11 DIAGNOSIS — C3491 Malignant neoplasm of unspecified part of right bronchus or lung: Secondary | ICD-10-CM

## 2021-03-11 NOTE — Telephone Encounter (Signed)
Attempted to to call the patient to review his path and cytology results. Unable to reach him, LMOVM. Will try him back. The small RUL nodule shows NSCLCA. The RLL opacity did not show any malignancy.  Will call him back

## 2021-03-13 LAB — ANAEROBIC CULTURE W GRAM STAIN

## 2021-03-14 NOTE — Telephone Encounter (Signed)
Spoke with the patient, reviewed the path with him. The small RUL nodule is positive - there was a smaller satellite nodule in the RUL as well. I placed fiducials and hope he will be a candidate for XRT.  Will refer him to Radiation Oncology at [facility] and order a PET. He has OV w Dr Erin Fulling this week to solidify the plan. Will route to Dr Dessa Phi

## 2021-03-15 ENCOUNTER — Ambulatory Visit (INDEPENDENT_AMBULATORY_CARE_PROVIDER_SITE_OTHER): Payer: Medicare Other | Admitting: General Practice

## 2021-03-15 ENCOUNTER — Other Ambulatory Visit: Payer: Self-pay

## 2021-03-15 DIAGNOSIS — Z7901 Long term (current) use of anticoagulants: Secondary | ICD-10-CM

## 2021-03-15 LAB — POCT INR: INR: 2.9 (ref 2.0–3.0)

## 2021-03-15 NOTE — Patient Instructions (Signed)
Pre visit review using our clinic review tool, if applicable. No additional management support is needed unless otherwise documented below in the visit note.  Continue to take 1/2 tablet daily except 1 tablet on Mondays and Fridays.  Re-check in 6 weeks.

## 2021-03-15 NOTE — Progress Notes (Signed)
Medical screening examination/treatment/procedure(s) were performed by non-physician practitioner and as supervising physician I was immediately available for consultation/collaboration. I agree with above. Saniyah Mondesir, MD   

## 2021-03-16 ENCOUNTER — Ambulatory Visit (INDEPENDENT_AMBULATORY_CARE_PROVIDER_SITE_OTHER): Payer: Medicare Other | Admitting: Acute Care

## 2021-03-16 ENCOUNTER — Ambulatory Visit (INDEPENDENT_AMBULATORY_CARE_PROVIDER_SITE_OTHER): Payer: Medicare Other

## 2021-03-16 ENCOUNTER — Encounter: Payer: Self-pay | Admitting: Acute Care

## 2021-03-16 VITALS — BP 124/78 | HR 88 | Temp 97.3°F | Ht 66.0 in | Wt 165.8 lb

## 2021-03-16 DIAGNOSIS — Z5181 Encounter for therapeutic drug level monitoring: Secondary | ICD-10-CM | POA: Diagnosis not present

## 2021-03-16 DIAGNOSIS — R0600 Dyspnea, unspecified: Secondary | ICD-10-CM | POA: Diagnosis not present

## 2021-03-16 DIAGNOSIS — J439 Emphysema, unspecified: Secondary | ICD-10-CM | POA: Diagnosis not present

## 2021-03-16 DIAGNOSIS — R042 Hemoptysis: Secondary | ICD-10-CM | POA: Diagnosis not present

## 2021-03-16 DIAGNOSIS — R0609 Other forms of dyspnea: Secondary | ICD-10-CM

## 2021-03-16 DIAGNOSIS — J449 Chronic obstructive pulmonary disease, unspecified: Secondary | ICD-10-CM | POA: Diagnosis not present

## 2021-03-16 NOTE — Progress Notes (Signed)
History of Present Illness [name] is a 85 y.o. male former smoker ( quit 2004 with a 30 pack year smoking history) originally seen by Dr. Erin Fulling for hemoptysis 08/2020. Other significant PMH includes aortic valve replacement and atrial fibrillation on coumadin.   Test Results: CT 10/2020 showed a vague ground-glass nodule in the apical segment right upper lobe, unchanged in the short interval from 09/16/2020 and new from 2014.  Follow up CT 01/2021 showed Right apical ground-glass nodule measuring 11 x 9 mm (mean 10 mm) is not significantly changed in size from prior exam, but has a slight increasing ground-glass component. There is a new faint ground-glass nodule more superiorly. 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 Dr. Lamonte Sakai did place fiducials with hope he is a candidate for XRT.  PET scan is ordered for 03/31/2021 Appointment with Dr. Sondra Come ( XRT) 03/23/2021   03/16/2021 Pt. Presents for follow up. He states he has been doing well. He does still have some hemoptysis. He states the blood is more red than what he was previously taking. He states he has hemoptysis about 3-4 times daily. Initially in the morning it is about the size of a quarter, then additional episodes are more like the size of a nickel or dime. ( He had taken amoxicillin x 1 month with no change in his hemoptysis. ) He has had no weight loss. He has appointments scheduled with Dr. Sondra Come and radiation oncology, in addition to a PET scan. He does not have a cough with the exception of in the mornings. He has some shortness of breath with exertion. He has had all questions asked and answered . He is in good spirits today.We have discussed that if he has any worsening of bleeding or cough, he needs to either call the office or seek emergency care. He has verbalized understanding    CBC Latest Ref Rng  & Units 03/07/2021 10/21/2020 08/06/2020  WBC 4.0 - 10.5 K/uL 4.9 8.3 8.3  Hemoglobin 13.0 - 17.0 g/dL 12.4(L) 13.5 13.5  Hematocrit 39.0 - 52.0 % 37.8(L) 40.3 41.2  Platelets 150 - 400 K/uL 186 215.0 181    BMP Latest Ref Rng & Units 03/07/2021 12/01/2020 11/22/2020  Glucose 70 - 99 mg/dL 86 95 144(H)  BUN 8 - 23 mg/dL 17 21 21   Creatinine 0.61 - 1.24 mg/dL 0.96 1.25 1.17  BUN/Creat Ratio 10 - 24 - 17 -  Sodium 135 - 145 mmol/L 137 140 140  Potassium 3.5 - 5.1 mmol/L 3.6 4.2 3.3(L)  Chloride 98 - 111 mmol/L 104 100 101  CO2 22 - 32 mmol/L 25 25 31   Calcium 8.9 - 10.3 mg/dL 8.6(L) 9.0 9.0    BNP    Component Value Date/Time   BNP 374.9 (H) 06/06/2015 1227    ProBNP    Component Value Date/Time   PROBNP 1,126 (H) 11/23/2017 0951    PFT No results found for: FEV1PRE, FEV1POST, FVCPRE, FVCPOST, TLC, DLCOUNC, PREFEV1FVCRT, PSTFEV1FVCRT  CT Chest Wo Contrast  Result Date: 02/22/2021 CLINICAL DATA:  Hemoptysis for 6 months. EXAM: CT CHEST WITHOUT CONTRAST TECHNIQUE: Multidetector CT imaging of the chest was performed following the standard protocol without IV contrast. COMPARISON:  Chest CT 11/23/2020, CT 09/16/2020. FINDINGS: Cardiovascular: Advanced aortic atherosclerosis. Aortic valve replacement. Thoracic aorta is tortuous. Similar mild cardiomegaly. Dilated central pulmonary artery at 3.7 cm. Coronary artery and mitral annulus calcifications. No pericardial effusion. Mediastinum/Nodes:  Similar mediastinal lymph nodes, all subcentimeter short axis, likely reactive. Hilar assessment including for hilar adenopathy is limited on this noncontrast exam. Distal esophagus is mildly patulous. No suspicious thyroid nodule. Lungs/Pleura: Again seen volume loss in the medial basilar right lower lobe with air bronchograms. This is slightly more prominent than on prior exam, current maximal thickness of 2.7 cm, previously 2.4 cm by my retrospective measurement. This is less well pronounced than on the  Oct 06, 2020 exam. There is adjacent ground-glass opacity and septal thickening. There is a trace right pleural effusion that is new. There is 7 mm pleural based nodule in the right lower lobe, series 4, image 117, also seen on prior exam. Improved left basilar consolidation and ground-glass opacity from prior. Right apical ground-glass nodule measuring 11 x 9 mm (mean 10 mm, series 4, image 24) is not significantly changed in size from prior exam, but has a slight increasing ground-glass component. There is a new faint ground-glass nodule more superiorly, series 4, image 22. Biapical pleuroparenchymal scarring. 4 mm anterior right upper lobe nodule series 4, image 74, is unchanged. Underlying emphysema. Minimal layering debris in the trachea, central bronchi are patent. Upper Abdomen: No acute upper abdominal findings. Musculoskeletal: Median sternotomy. Flowing osteophytes throughout thoracic spine consistent with diffuse idiopathic skeletal hyperostosis. No focal bone lesion or acute osseous abnormalities. There is degenerative change of both glenohumeral joints. IMPRESSION: 1. Again seen volume loss in the medial basilar right lower lobe with air bronchograms. This is slightly more prominent than on prior exam, current maximal thickness of 2.7 cm. This is less well pronounced than on the 10/06/20 exam. Differential considerations include chronic area of post infectious/inflammatory scarring, recurrent aspiration. Possibility of indolent malignancy is considered, though felt unlikely given the waxing and waning appearance. 2. Right apical ground-glass nodule measuring 11 x 9 mm (mean 10 mm) is not significantly changed in size from prior exam, but has a slight increasing ground-glass component. There is a new faint ground-glass nodule more superiorly. 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 Follow-up CT recommended in 6-12 months. 3. Improved left basilar consolidation and ground-glass opacity from prior.  4. New trace right pleural effusion. 5. Dilated central pulmonary artery suggesting pulmonary arterial hypertension. 6. Emphysema. Aortic Atherosclerosis (ICD10-I70.0) and Emphysema (ICD10-J43.9). Electronically Signed   By: Keith Rake M.D.   On: 02/22/2021 10:19   DG Chest Port 1 View  Result Date: 03/07/2021 CLINICAL DATA:  Post bronchoscopy with multiple biopsies on the right. EXAM: PORTABLE CHEST 1 VIEW COMPARISON:  08/06/2020 and CT chest 02/21/2021. FINDINGS: Trachea is midline. Heart is enlarged. No definite pneumothorax status post right lung biopsies. Right basilar airspace opacity, better evaluated on 02/21/2021. Bibasilar atelectasis. No definite pleural fluid. Degenerative changes in the shoulders. IMPRESSION: 1. No definite pneumothorax status post biopsies of the right lung. Right lung nodules are better seen on 02/21/2021. 2. Right basilar airspace opacity, better evaluated on 02/21/2021. 3. Bibasilar atelectasis. Electronically Signed   By: Lorin Picket M.D.   On: 03/07/2021 13:36   DG C-ARM BRONCHOSCOPY  Result Date: 03/07/2021 C-ARM BRONCHOSCOPY: Fluoroscopy was utilized by the requesting physician.  No radiographic interpretation.     Past medical hx Past Medical History:  Diagnosis Date   Aortic stenosis    Atrial fibrillation (HCC)    Baker cyst    Left knee   DJD (degenerative joint disease)    knees   Dysrhythmia    Eczema    Erectile dysfunction    Gout  History of hiatal hernia    HTN (hypertension)    MVP (mitral valve prolapse)    Shortness of breath dyspnea    with activity     Social History   Tobacco Use   Smoking status: Former    Years: 30.00    Types: Cigarettes    Quit date: 08/28/2002    Years since quitting: 18.5   Smokeless tobacco: Never  Vaping Use   Vaping Use: Never used  Substance Use Topics   Alcohol use: Yes    Alcohol/week: 3.0 standard drinks    Types: 3 Shots of liquor per week    Comment: social   Drug use: No     Mr.Mcjunkin reports that he quit smoking about 18 years ago. His smoking use included cigarettes. He has never used smokeless tobacco. He reports current alcohol use of about 3.0 standard drinks of alcohol per week. He reports that he does not use drugs.  Tobacco Cessation: Former smoker , quit 2004   Past surgical hx, Family hx, Social hx all reviewed.  Current Outpatient Medications on File Prior to Visit  Medication Sig   allopurinol (ZYLOPRIM) 300 MG tablet Take 1 tablet (300 mg total) by mouth daily.   amLODipine (NORVASC) 5 MG tablet Take 1 tablet (5 mg total) by mouth daily.   furosemide (LASIX) 40 MG tablet Take 1 tablet (40 mg total) by mouth daily.   Ibuprofen 200 MG CAPS Take 400 mg by mouth every 6 (six) hours as needed for fever.   potassium chloride SA (KLOR-CON) 20 MEQ tablet Take 1 tablet (20 mEq total) by mouth daily.   predniSONE (DELTASONE) 20 MG tablet Take 0.5 tablets (10 mg total) by mouth daily with breakfast.   tamsulosin (FLOMAX) 0.4 MG CAPS capsule Take 1 capsule (0.4 mg total) by mouth daily after supper.   traMADol (ULTRAM) 50 MG tablet Take 1 tablet (50 mg total) by mouth 2 (two) times daily. FOR  CHRONIC  PAIN   traZODone (DESYREL) 50 MG tablet Take 0.5 tablets (25 mg total) by mouth at bedtime as needed for sleep.   triamcinolone cream (KENALOG) 0.1 % Apply 1 application topically 2 (two) times daily as needed (itching). Around the nose   warfarin (COUMADIN) 5 MG tablet Take 1 tablet (5 mg total) by mouth one time only at 4 PM. Take 5 mg Monday and Friday all the other days take 2.5 mg Take as directed by anticoagulation clinic   No current facility-administered medications on file prior to visit.     No Known Allergies  Review Of Systems:  Constitutional:   No  weight loss, night sweats,  Fevers, chills, fatigue, or  lassitude.  HEENT:   No headaches,  Difficulty swallowing,  Tooth/dental problems, or  Sore throat,                No sneezing,  itching, ear ache, nasal congestion, post nasal drip,   CV:  No chest pain,  Orthopnea, PND, swelling in lower extremities, anasarca, dizziness, palpitations, syncope.   GI  No heartburn, indigestion, abdominal pain, nausea, vomiting, diarrhea, change in bowel habits, loss of appetite, bloody stools.   Resp: + shortness of breath with exertion none at rest.  No excess mucus, no productive cough,  No non-productive cough,  + coughing up of blood.  No change in color of mucus.  No wheezing.  No chest wall deformity  Skin: no rash or lesions.  GU: no dysuria, change in color of  urine, no urgency or frequency.  No flank pain, no hematuria   MS:  No joint pain or swelling.  No decreased range of motion.  No back pain.  Psych:  No change in mood or affect. No depression or anxiety.  No memory loss.   Vital Signs BP 124/78 (BP Location: Left Arm, Cuff Size: Normal)   Pulse 88   Temp (!) 97.3 F (36.3 C) (Oral)   Ht 5\' 6"  (1.676 m)   Wt 165 lb 12.8 oz (75.2 kg)   SpO2 98%   BMI 26.76 kg/m    Physical Exam:  General- No distress,  A&O x 3, pleasant ENT: No sinus tenderness, TM clear, pale nasal mucosa, no oral exudate,no post nasal drip, no LAN Cardiac: S1, S2, regular rate and rhythm, no murmur Chest: No wheeze/ rales/ dullness; no accessory muscle use, no nasal flaring, no sternal retractions Abd.: Soft Non-tender, ND, BS +, Body mass index is 26.76 kg/m. Ext: No clubbing cyanosis, edema Neuro:  normal strength, MAE x 4, A&O x 3, appropriate Skin: No rashes, warm and dry Psych: normal mood and behavior   Assessment/Plan Hemoptysis/ Non-small cell cancer of right lung Plan We will do a CXR today We will call you with results PET scan is ordered for 03/31/2021 Your Appointment with Dr. Sondra Come ( XRT) 03/23/2021. We will schedule a follow up with Dr. Erin Fulling or Judson Roch NP in 2 weeks .( After August 4th) to ensure bleeding is not worsening. If bleeding gets worse call the office or  seek emergency care.  Please contact office for sooner follow up if symptoms do not improve or worsen or seek emergency care    I spent 35 minutes dedicated to the care of this patient on the date of this encounter to include pre-visit review of records, face-to-face time with the patient discussing conditions above, post visit ordering of testing, clinical documentation with the electronic health record, making appropriate referrals as documented, and communicating necessary information to the patient's healthcare team.    Magdalen Spatz, NP 03/16/2021  4:34 PM

## 2021-03-16 NOTE — Patient Instructions (Addendum)
It was so nice to meet you today We will do a CXR today We will call you with results PET scan is ordered for 03/31/2021 Your Appointment with Dr. Sondra Come ( XRT) 03/23/2021. We will schedule a follow up with Dr. Erin Fulling or Judson Roch NP in 2 weeks .( After August 4th) If bleeding gets worse call the office or seek emergency care.  Please contact office for sooner follow up if symptoms do not improve or worsen or seek emergency care.

## 2021-03-17 ENCOUNTER — Other Ambulatory Visit: Payer: Self-pay | Admitting: *Deleted

## 2021-03-17 NOTE — Progress Notes (Signed)
The proposed treatment discussed in cancer conference is for discussion purpose only and is not a binding recommendation. The patient was not physically examined nor present for their treatment options. Therefore, final treatment plans cannot be decided.  ?

## 2021-03-18 NOTE — Progress Notes (Signed)
Location of tumor and Histology per Pathology Report: right lung     Biopsy: 03/07/2021  Past/Anticipated interventions by surgeon, if any:  Video Bronchoscopy with Electromagnetic Navigation Procedure Note Date of Operation: 03/07/2021  Surgeon: Baltazar Apo  Past/Anticipated interventions by medical oncology, if any: Chemotherapy none at this time    Pain issues, if any:  no   SAFETY ISSUES: Prior radiation? yes, vocal cords in 1993 Pacemaker/ICD? no Possible current pregnancy? no Is the patient on methotrexate? no  Current Complaints / other details:  denies cough     Vitals:   03/23/21 1037  BP: (!) 155/70  Pulse: 75  Resp: 18  Temp: (!) 97.4 F (36.3 C)  TempSrc: Temporal  SpO2: 100%  Weight: 167 lb 2 oz (75.8 kg)  Height: 5\' 6"  (1.676 m)

## 2021-03-21 NOTE — Telephone Encounter (Signed)
Thanks Rob, appreciate you taking care of him.  Wille Glaser

## 2021-03-22 NOTE — Progress Notes (Signed)
Radiation Oncology         [phone] ________________________________  Initial Outpatient Consultation   Name: [name] MRN: [id number]  Date: 03/23/2021  DOB: 10-18-1935  CL:[id number] Everardo Beals, MD  Collene Gobble, MD   REFERRING PHYSICIAN: Collene Gobble, MD  DIAGNOSIS: The primary encounter diagnosis was Malignant neoplasm of upper lobe of right lung [facility]). A diagnosis of Pulmonary nodules was also pertinent to this visit.  Clinical stage I non-small cell lung cancer   HISTORY OF PRESENT ILLNESS::[name] is a 85 y.o. male who is accompanied by no one. he is seen as a courtesy of Dr. Lamonte Sakai for an opinion concerning radiation therapy as part of management for his recently diagnosed right lung cancer. The patient initially presented to the [place] ED on 08/06/20 (per pcp referral) for evaluation of hemoptysis with cough for over a week. Chest x-ray taken at this time showed some patchy bibasilar opacities with atelectasis versus infiltrate. He was prescribed augmentin by the ER team which he denies any improvement in his hemoptysis.  The patient was then referred to Dr. Raeanne Barry by his pcp on 09/10/20 for evaluation. The patient was noted to still be experiencing hemoptysis at this point; a sample was needed and the patient coughed up around a tablespoon worth of dark blood with sputum. The patient stated that he coughs up more mucous/blood in the mornings. He denied fever, chills, weight loss, loss of appetite, nausea or vomiting. Chest CT taken on 09/16/20 demonstrated bibasilar areas of consolidation with central air bronchograms, noted to be concerning for pneumonia, and worse on the right.  The patient soon after underwent video bronchoscopy under Dr. Erin Fulling on 09/21/20. Cytology revealed no malignant cells identified. Dr. Erin Fulling then referred the patient to Dr. Joya Gaskins ([place]) for consultation on 09/28/20. Exam performed during this visit revealed  no evidence of malignancy with several small areas that could account for hemoptysis. Dr. Joya Gaskins recommended for the patient to undergo an in-office laryngoscopy and tracheoscopy.  On 11/01/20, the patient underwent laryngoscopy and tracheoscopy under Dr. Joya Gaskins revealing: scant residual blood and suspected scarring and obstruction of the distal right lung.  Chest CT taken on 11/23/20 demonstrated:  The vague ground-glass nodule in the apical segment right upper lobe measuring 11 mm, noted to be similar to size when seen on 09/16/20. Additionally, the ground-glass and septal thickening previously seen in the inferomedial aspect of both lower lobes and lingula was noted to be unchanged.  Chest CT taken on 02/21/21 revealed: the right apical ground-glass nodule to have not significantly changed in size from prior exam (measuring 11 x 9 mm), but has a slight increasing ground-glass component. Additionally, a new faint ground-glass nodule more superiorly was seen. The 4 mm anterior right upper lobe nodule was noted to be unchanged as well. OF note: again seen volume loss in the medial basilar right lower lobe with air bronchograms; slightly more prominent than on prior exam, current maximal thickness of 2.7 cm.  The patient underwent navigational bronchoscopy on 03/07/21 under Dr. Lamonte Sakai revealing malignant cells to be present from the right upper lobe nodule fine-needle aspiration.  The cells were consistent with non-small cell carcinoma  PREVIOUS RADIATION THERAPY: Yes, the patient reports receiving radiation therapy directed at his larynx 1993.  He received several weeks of radiation therapy in the direction of Dr. Arloa Koh.  Treatments were at the [facility] facility prior to the cancer center moving to [facility].  PAST MEDICAL HISTORY:  Past Medical History:  Diagnosis Date   Aortic stenosis    Atrial fibrillation (HCC)    Baker cyst    Left knee   DJD (degenerative joint disease)     knees   Dysrhythmia    Eczema    Erectile dysfunction    Gout    History of hiatal hernia    HTN (hypertension)    MVP (mitral valve prolapse)    Shortness of breath dyspnea    with activity    PAST SURGICAL HISTORY: Past Surgical History:  Procedure Laterality Date    polyps vocal cord  1994   AORTIC VALVE REPLACEMENT  ~2004   BRONCHIAL BIOPSY  03/07/2021   Procedure: BRONCHIAL BIOPSIES;  Surgeon: Collene Gobble, MD;  Location: [facility] ENDOSCOPY;  Service: Pulmonary;;   BRONCHIAL BRUSHINGS  03/07/2021   Procedure: BRONCHIAL BRUSHINGS;  Surgeon: Collene Gobble, MD;  Location: [facility] ENDOSCOPY;  Service: Pulmonary;;   BRONCHIAL NEEDLE ASPIRATION BIOPSY  03/07/2021   Procedure: BRONCHIAL NEEDLE ASPIRATION BIOPSIES;  Surgeon: Collene Gobble, MD;  Location: [facility] ENDOSCOPY;  Service: Pulmonary;;   BRONCHIAL WASHINGS  09/21/2020   Procedure: BRONCHIAL WASHINGS;  Surgeon: Freddi Starr, MD;  Location: [facility] ENDOSCOPY;  Service: Pulmonary;;  BAL    BRONCHIAL WASHINGS  03/07/2021   Procedure: BRONCHIAL WASHINGS;  Surgeon: Collene Gobble, MD;  Location: [place];  Service: Pulmonary;;   FIDUCIAL MARKER PLACEMENT  03/07/2021   Procedure: FIDUCIAL MARKER PLACEMENT;  Surgeon: Collene Gobble, MD;  Location: [facility] ENDOSCOPY;  Service: Pulmonary;;   HERNIA REPAIR  ~1978   HERNIA REPAIR  ~1983   LUMBAR LAMINECTOMY/DECOMPRESSION MICRODISCECTOMY N/A 01/15/2015   Procedure: 2 LEVEL DECOMPRESSIVE LUMBAR LAMINECTOMY L3-L4,L4-L5;  Surgeon: Latanya Maudlin, MD;  Location: [facility] ORS;  Service: Orthopedics;  Laterality: N/A;   LUMBAR LAMINECTOMY/DECOMPRESSION MICRODISCECTOMY N/A 05/01/2017   Procedure: Lumbar one-Lumbar two, Lumbar two-Lumbar three, Lumbar three-Lumbar four Laminectomy; Evacuation of hematoma;  Surgeon: Consuella Lose, MD;  Location: [place];  Service: Neurosurgery;  Laterality: N/A;   right total hip  ~4 years ago   [place] Left ~2009   TOTAL KNEE ARTHROPLASTY Left 04/14/2015   Procedure:  LEFT TOTAL KNEE ARTHROPLASTY;  Surgeon: Latanya Maudlin, MD;  Location: [facility] ORS;  Service: Orthopedics;  Laterality: Left;   VIDEO BRONCHOSCOPY N/A 09/21/2020   Procedure: VIDEO BRONCHOSCOPY WITHOUT FLUORO;  Surgeon: Freddi Starr, MD;  Location: [facility] ENDOSCOPY;  Service: Pulmonary;  Laterality: N/A;   VIDEO BRONCHOSCOPY WITH ENDOBRONCHIAL NAVIGATION Right 03/07/2021   Procedure: VIDEO BRONCHOSCOPY WITH ENDOBRONCHIAL NAVIGATION;  Surgeon: Collene Gobble, MD;  Location: [facility] ENDOSCOPY;  Service: Pulmonary;  Laterality: Right;    FAMILY HISTORY:  Family History  Problem Relation Age of Onset   Diabetes Other    Stroke Other     SOCIAL HISTORY:  Social History   Tobacco Use   Smoking status: Former    Years: 30.00    Types: Cigarettes    Quit date: 08/28/2002    Years since quitting: 18.5   Smokeless tobacco: Never  Vaping Use   Vaping Use: Never used  Substance Use Topics   Alcohol use: Yes    Alcohol/week: 3.0 standard drinks    Types: 3 Shots of liquor per week    Comment: social   Drug use: No    ALLERGIES: No Known Allergies  MEDICATIONS:  Current Outpatient Medications  Medication Sig Dispense Refill   allopurinol (ZYLOPRIM) 300 MG tablet Take 1 tablet (  300 mg total) by mouth daily.     amLODipine (NORVASC) 5 MG tablet Take 1 tablet (5 mg total) by mouth daily.     furosemide (LASIX) 40 MG tablet Take 1 tablet (40 mg total) by mouth daily.     Ibuprofen 200 MG CAPS Take 400 mg by mouth every 6 (six) hours as needed for fever.     potassium chloride SA (KLOR-CON) 20 MEQ tablet Take 1 tablet (20 mEq total) by mouth daily. 90 tablet 3   predniSONE (DELTASONE) 20 MG tablet Take 0.5 tablets (10 mg total) by mouth daily with breakfast.     tamsulosin (FLOMAX) 0.4 MG CAPS capsule Take 1 capsule (0.4 mg total) by mouth daily after supper.     traMADol (ULTRAM) 50 MG tablet Take 1 tablet (50 mg total) by mouth 2 (two) times daily. FOR  CHRONIC  PAIN 60 tablet 0   traZODone  (DESYREL) 50 MG tablet Take 0.5 tablets (25 mg total) by mouth at bedtime as needed for sleep.     triamcinolone cream (KENALOG) 0.1 % Apply 1 application topically 2 (two) times daily as needed (itching). Around the nose     VITAMIN D PO Take 1 tablet by mouth daily.     warfarin (COUMADIN) 5 MG tablet Take 1 tablet (5 mg total) by mouth one time only at 4 PM. Take 5 mg Monday and Friday all the other days take 2.5 mg Take as directed by anticoagulation clinic     No current facility-administered medications for this encounter.    REVIEW OF SYSTEMS:  A 10+ POINT REVIEW OF SYSTEMS WAS OBTAINED including neurology, dermatology, psychiatry, cardiac, respiratory, lymph, extremities, GI, GU, musculoskeletal, constitutional, reproductive, HEENT.  He reports hoarseness from his prior laryngeal radiation treatments.  He reports a small radiation field directed at his anterior neck without treatments to the supraclavicular area.  He reports history of atrial fibrillation on Coumadin for this issue.   PHYSICAL EXAM:  height is _0  (1.676 m) and weight is 167 lb 2 oz (75.8 kg). His temporal temperature is 97.4 F (36.3 C) (abnormal). His blood pressure is 155/70 (abnormal) and his pulse is 75. His respiration is 18 and oxygen saturation is 100%.   General: Alert and oriented, in no acute distress HEENT: Head is normocephalic. Extraocular movements are intact. Oropharynx is clear. Neck: Neck is supple, no palpable cervical or supraclavicular lymphadenopathy. Heart: Irregular  rate and rhythm with no murmurs, rubs, or gallops consistent with prior history of atrial fibrillation. Chest: Clear to auscultation bilaterally, with no rhonchi, wheezes, or rales.  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 Extremities: No cyanosis or edema. Lymphatics: see Neck Exam Skin: No concerning lesions. Musculoskeletal: symmetric strength and muscle tone  throughout. Neurologic: Cranial nerves II through XII are grossly intact. No obvious focalities. Speech is fluent. Coordination is intact. Psychiatric: Judgment and insight are intact. Affect is appropriate.   ECOG = 1  0 - Asymptomatic (Fully active, able to carry on all predisease activities without restriction)  1 - Symptomatic but completely ambulatory (Restricted in physically strenuous activity but ambulatory and able to carry out work of a light or sedentary nature. For example, light housework, office work)  2 - Symptomatic, <50% in bed during the day (Ambulatory and capable of all self care but unable to carry out any work activities. Up and about more than 50% of waking hours)  3 - Symptomatic, >50% in  bed, but not bedbound (Capable of only limited self-care, confined to bed or chair 50% or more of waking hours)  4 - Bedbound (Completely disabled. Cannot carry on any self-care. Totally confined to bed or chair)  5 - Death   Eustace Pen MM, Creech RH, Tormey DC, et al. [phone]). "Toxicity and response criteria of the [facility]". [place] Oncol. 5 (6): 649-55  LABORATORY DATA:  Lab Results  Component Value Date   WBC 4.9 03/07/2021   HGB 12.4 (L) 03/07/2021   HCT 37.8 (L) 03/07/2021   MCV 100.8 (H) 03/07/2021   PLT 186 03/07/2021   NEUTROABS 7.0 10/21/2020   Lab Results  Component Value Date   NA 137 03/07/2021   K 3.6 03/07/2021   CL 104 03/07/2021   CO2 25 03/07/2021   GLUCOSE 86 03/07/2021   CREATININE 0.96 03/07/2021   CALCIUM 8.6 (L) 03/07/2021      RADIOGRAPHY: DG Chest 2 View  Result Date: 03/16/2021 CLINICAL DATA:  Dyspnea on exertion, post bronchoscopy on 03/07/2021 EXAM: CHEST - 2 VIEW COMPARISON:  03/07/2021 Correlation: CT chest 02/21/2021 FINDINGS: Minimal enlargement of cardiac silhouette post median sternotomy and AVR. Mediastinal contours and pulmonary vascularity normal. Atherosclerotic calcification aorta. Emphysematous  changes and minimal central peribronchial thickening consistent with COPD. No acute infiltrate, pleural effusion, or pneumothorax. Probable RIGHT nipple shadow, with no nodule seen on recent CT IMPRESSION: COPD changes without acute abnormalities. Aortic Atherosclerosis (ICD10-I70.0) and Emphysema (ICD10-J43.9). Electronically Signed   By: Lavonia Dana M.D.   On: 03/16/2021 17:12   DG Chest Port 1 View  Result Date: 03/07/2021 CLINICAL DATA:  Post bronchoscopy with multiple biopsies on the right. EXAM: PORTABLE CHEST 1 VIEW COMPARISON:  08/06/2020 and CT chest 02/21/2021. FINDINGS: Trachea is midline. Heart is enlarged. No definite pneumothorax status post right lung biopsies. Right basilar airspace opacity, better evaluated on 02/21/2021. Bibasilar atelectasis. No definite pleural fluid. Degenerative changes in the shoulders. IMPRESSION: 1. No definite pneumothorax status post biopsies of the right lung. Right lung nodules are better seen on 02/21/2021. 2. Right basilar airspace opacity, better evaluated on 02/21/2021. 3. Bibasilar atelectasis. Electronically Signed   By: Lorin Picket M.D.   On: 03/07/2021 13:36   DG C-ARM BRONCHOSCOPY  Result Date: 03/07/2021 C-ARM BRONCHOSCOPY: Fluoroscopy was utilized by the requesting physician.  No radiographic interpretation.      IMPRESSION: Clinical stage I non-small cell lung cancer presenting in the right upper lobe.    He has a smaller lesion adjacent to this biopsy site which in addition may be malignancy.  We discussed general approaches to clinical stage I non-small cell lung cancer, that being surgical intervention and SBRT.  Patient has had multiple surgeries in the past and does not wish to consider surgery management of this issue.  Today, I talked to the patient and  about the findings and work-up thus far.  We discussed the natural history of non-small cell lung cancer and general treatment, highlighting the role of radiotherapy in the  management.  We discussed the available radiation techniques, and focused on the details of logistics and delivery.  We reviewed the anticipated acute and late sequelae associated with radiation in this setting.  The patient was encouraged to ask questions that I answered to the best of my ability.  A patient consent form was discussed and signed.  We retained a copy for our records.  The patient would like to proceed with radiation and will be scheduled for CT simulation.  He is scheduled for a PET scan on August 4 and I will await results of the scan prior to proceeding with SBRT.  Anticipate 3 fractions which will include the biopsy proven site and adjacent smaller satellite lesion.  PLAN: PET scan August 4, SBRT simulation on August 16 unless PET scan shows surprising findings that may require work-up.   60 minutes of total time was spent for this patient encounter, including preparation, face-to-face counseling with the patient and coordination of care, physical exam, and documentation of the encounter.   ------------------------------------------------  Blair Promise, PhD, MD  This document serves as a record of services personally performed by Gery Pray, MD. It was created on his behalf by Roney Mans, a trained medical scribe. The creation of this record is based on the scribe's personal observations and the provider's statements to them. This document has been checked and approved by the attending provider.

## 2021-03-23 ENCOUNTER — Other Ambulatory Visit: Payer: Self-pay

## 2021-03-23 ENCOUNTER — Ambulatory Visit
Admission: RE | Admit: 2021-03-23 | Discharge: 2021-03-23 | Disposition: A | Discharge status: Home / Self Care | Payer: Medicare Other | Source: Ambulatory Visit | Attending: Radiation Oncology | Admitting: Radiation Oncology

## 2021-03-23 ENCOUNTER — Encounter: Payer: Self-pay | Admitting: Radiation Oncology

## 2021-03-23 VITALS — BP 155/70 | HR 75 | Temp 97.4°F | Resp 18 | Ht 66.0 in | Wt 167.1 lb

## 2021-03-23 DIAGNOSIS — Z79899 Other long term (current) drug therapy: Secondary | ICD-10-CM | POA: Insufficient documentation

## 2021-03-23 DIAGNOSIS — M109 Gout, unspecified: Secondary | ICD-10-CM | POA: Insufficient documentation

## 2021-03-23 DIAGNOSIS — C3411 Malignant neoplasm of upper lobe, right bronchus or lung: Secondary | ICD-10-CM | POA: Diagnosis not present

## 2021-03-23 DIAGNOSIS — I4891 Unspecified atrial fibrillation: Secondary | ICD-10-CM | POA: Diagnosis not present

## 2021-03-23 DIAGNOSIS — M1712 Unilateral primary osteoarthritis, left knee: Secondary | ICD-10-CM | POA: Diagnosis not present

## 2021-03-23 DIAGNOSIS — I080 Rheumatic disorders of both mitral and aortic valves: Secondary | ICD-10-CM | POA: Diagnosis not present

## 2021-03-23 DIAGNOSIS — Z7901 Long term (current) use of anticoagulants: Secondary | ICD-10-CM | POA: Diagnosis not present

## 2021-03-23 DIAGNOSIS — Z87891 Personal history of nicotine dependence: Secondary | ICD-10-CM | POA: Insufficient documentation

## 2021-03-23 DIAGNOSIS — J439 Emphysema, unspecified: Secondary | ICD-10-CM | POA: Diagnosis not present

## 2021-03-23 DIAGNOSIS — N529 Male erectile dysfunction, unspecified: Secondary | ICD-10-CM | POA: Diagnosis not present

## 2021-03-23 DIAGNOSIS — I10 Essential (primary) hypertension: Secondary | ICD-10-CM | POA: Diagnosis not present

## 2021-03-23 DIAGNOSIS — Z923 Personal history of irradiation: Secondary | ICD-10-CM | POA: Insufficient documentation

## 2021-03-23 DIAGNOSIS — Z7952 Long term (current) use of systemic steroids: Secondary | ICD-10-CM | POA: Insufficient documentation

## 2021-03-23 DIAGNOSIS — R918 Other nonspecific abnormal finding of lung field: Secondary | ICD-10-CM

## 2021-03-23 NOTE — Progress Notes (Signed)
See MD note for nursing evaluation. °

## 2021-03-28 LAB — CULTURE, FUNGUS WITHOUT SMEAR

## 2021-03-31 ENCOUNTER — Other Ambulatory Visit: Payer: Self-pay

## 2021-03-31 ENCOUNTER — Encounter (HOSPITAL_COMMUNITY)
Admission: RE | Admit: 2021-03-31 | Discharge: 2021-03-31 | Disposition: A | Discharge status: Home / Self Care | Payer: Medicare Other | Source: Ambulatory Visit | Attending: Emergency Medicine | Admitting: Emergency Medicine

## 2021-03-31 DIAGNOSIS — C3491 Malignant neoplasm of unspecified part of right bronchus or lung: Secondary | ICD-10-CM | POA: Insufficient documentation

## 2021-03-31 DIAGNOSIS — I999 Unspecified disorder of circulatory system: Secondary | ICD-10-CM | POA: Diagnosis not present

## 2021-03-31 DIAGNOSIS — C3490 Malignant neoplasm of unspecified part of unspecified bronchus or lung: Secondary | ICD-10-CM | POA: Diagnosis not present

## 2021-03-31 LAB — GLUCOSE, CAPILLARY: Glucose-Capillary: 114 mg/dL — ABNORMAL HIGH (ref 70–99)

## 2021-03-31 MED ORDER — FLUDEOXYGLUCOSE F - 18 (FDG) INJECTION
8.3000 | Freq: Once | INTRAVENOUS | Status: AC
  Administered 2021-03-31: 8.3 via INTRAVENOUS

## 2021-04-05 ENCOUNTER — Other Ambulatory Visit: Payer: Self-pay | Admitting: Internal Medicine

## 2021-04-08 ENCOUNTER — Encounter: Payer: Self-pay | Admitting: Pulmonary Disease

## 2021-04-08 ENCOUNTER — Other Ambulatory Visit: Payer: Self-pay

## 2021-04-08 ENCOUNTER — Ambulatory Visit: Payer: Medicare Other | Admitting: Pulmonary Disease

## 2021-04-08 VITALS — BP 134/80 | HR 94 | Temp 98.2°F | Ht 66.0 in | Wt 167.8 lb

## 2021-04-08 DIAGNOSIS — C3491 Malignant neoplasm of unspecified part of right bronchus or lung: Secondary | ICD-10-CM

## 2021-04-08 DIAGNOSIS — R042 Hemoptysis: Secondary | ICD-10-CM | POA: Diagnosis not present

## 2021-04-08 NOTE — Progress Notes (Signed)
Synopsis: Referred in 08/2020 for hemoptysis   Subjective:   PATIENT ID: [name]: male DOB: 1935-12-15, MRN: [id number]   HPI  Chief Complaint  Patient presents with   Follow-up    F/U to discuss PET scan results. States he is still coughing small amounts of bright red blood. Denies any changes in his SOB.    [name] is an 85 year old male, former smoker with aortic valve replacement and atrial fibrillation on coumadin who returns to pulmonary clinic for hemoptysis.   He underwent navigational bronchoscopy for the RUL lesion and RLL lesion with the RUL lesion positive for non-small cancer and no malignancy noted of the RLL lesion. He is now following with Radiation Oncology with Dr. Sondra Come and is scheduled to start SBRT later this month.  He continues to cough up blood intermittently. He denies any changes in his breathing or issues with shortness of breath or wheezing. Denies any weight loss.  Past Medical History:  Diagnosis Date   Aortic stenosis    Atrial fibrillation (HCC)    Baker cyst    Left knee   DJD (degenerative joint disease)    knees   Dysrhythmia    Eczema    Erectile dysfunction    Gout    History of hiatal hernia    HTN (hypertension)    MVP (mitral valve prolapse)    Shortness of breath dyspnea    with activity     Family History  Problem Relation Age of Onset   Diabetes Other    Stroke Other      Social History   Socioeconomic History   Marital status: Divorced    Spouse name: Not on file   Number of children: Not on file   Years of education: Not on file   Highest education level: Not on file  Occupational History   Not on file  Tobacco Use   Smoking status: Former    Years: 30.00    Types: Cigarettes    Quit date: 08/28/2002    Years since quitting: 18.6   Smokeless tobacco: Never  Vaping Use   Vaping Use: Never used  Substance and Sexual Activity   Alcohol use: Yes    Alcohol/week: 3.0 standard drinks     Types: 3 Shots of liquor per week    Comment: social   Drug use: No   Sexual activity: Not on file  Other Topics Concern   Not on file  Social History Narrative   Not on file   Social Determinants of Health   Financial Resource Strain: Not on file  Food Insecurity: Not on file  Transportation Needs: Not on file  Physical Activity: Not on file  Stress: Not on file  Social Connections: Not on file  Intimate Partner Violence: Not on file     No Known Allergies   Outpatient Medications Prior to Visit  Medication Sig Dispense Refill   allopurinol (ZYLOPRIM) 300 MG tablet Take 1 tablet (300 mg total) by mouth daily.     amLODipine (NORVASC) 5 MG tablet Take 1 tablet (5 mg total) by mouth daily.     furosemide (LASIX) 40 MG tablet Take 1 tablet (40 mg total) by mouth daily.     Ibuprofen 200 MG CAPS Take 400 mg by mouth every 6 (six) hours as needed for fever.     potassium chloride SA (KLOR-CON) 20 MEQ tablet Take 1 tablet (20 mEq total) by mouth daily. 90 tablet 3  predniSONE (DELTASONE) 20 MG tablet Take 0.5 tablets (10 mg total) by mouth daily with breakfast.     tamsulosin (FLOMAX) 0.4 MG CAPS capsule TAKE 1 CAPSULE BY MOUTH ONCE DAILY AFTER BREAKFAST 90 capsule 1   traMADol (ULTRAM) 50 MG tablet Take 1 tablet (50 mg total) by mouth 2 (two) times daily. FOR  CHRONIC  PAIN 60 tablet 0   traZODone (DESYREL) 50 MG tablet Take 0.5 tablets (25 mg total) by mouth at bedtime as needed for sleep.     triamcinolone cream (KENALOG) 0.1 % Apply 1 application topically 2 (two) times daily as needed (itching). Around the nose     VITAMIN D PO Take 1 tablet by mouth daily.     warfarin (COUMADIN) 5 MG tablet Take 1 tablet (5 mg total) by mouth one time only at 4 PM. Take 5 mg Monday and Friday all the other days take 2.5 mg Take as directed by anticoagulation clinic     No facility-administered medications prior to visit.    Review of Systems  Constitutional:  Negative for chills, fever,  malaise/fatigue and weight loss.  HENT:  Negative for nosebleeds.   Eyes: Negative.   Respiratory:  Positive for cough and hemoptysis. Negative for sputum production, shortness of breath and wheezing.   Cardiovascular:  Negative for chest pain, palpitations, orthopnea, claudication, leg swelling and PND.  Gastrointestinal:  Negative for abdominal pain, blood in stool, heartburn, nausea and vomiting.  Genitourinary:  Negative for hematuria.  Musculoskeletal:  Positive for joint pain.  Skin:  Negative for rash.  Neurological:  Negative for dizziness, weakness and headaches.  Endo/Heme/Allergies:  Does not bruise/bleed easily.  Psychiatric/Behavioral: Negative.    Objective:   Vitals:   04/08/21 1119  BP: 134/80  Pulse: 94  Temp: 98.2 F (36.8 C)  TempSrc: Oral  SpO2: 96%  Weight: 167 lb 12.8 oz (76.1 kg)  Height: 5\' 6"  (1.676 m)     Physical Exam Constitutional:      Appearance: Normal appearance. He is normal weight.  HENT:     Head: Normocephalic and atraumatic.     Nose: Nose normal. No congestion or rhinorrhea.     Mouth/Throat:     Mouth: Mucous membranes are moist.     Pharynx: Oropharynx is clear. No oropharyngeal exudate or posterior oropharyngeal erythema.  Eyes:     General: No scleral icterus.    Conjunctiva/sclera: Conjunctivae normal.     Pupils: Pupils are equal, round, and reactive to light.  Cardiovascular:     Rate and Rhythm: Normal rate. Rhythm irregular.     Pulses: Normal pulses.     Heart sounds: Normal heart sounds.  Pulmonary:     Effort: Pulmonary effort is normal.     Breath sounds: No wheezing, rhonchi or rales.  Abdominal:     General: Bowel sounds are normal.     Palpations: Abdomen is soft.  Musculoskeletal:     Right lower leg: No edema.     Left lower leg: No edema.  Lymphadenopathy:     Cervical: No cervical adenopathy.  Skin:    General: Skin is warm and dry.  Neurological:     General: No focal deficit present.     Mental  Status: He is alert.  Psychiatric:        Mood and Affect: Mood normal.        Behavior: Behavior normal.        Thought Content: Thought content normal.  Judgment: Judgment normal.   CBC    Component Value Date/Time   WBC 4.9 03/07/2021 0649   RBC 3.75 (L) 03/07/2021 0649   HGB 12.4 (L) 03/07/2021 0649   HGB 13.0 04/02/2020 1019   HCT 37.8 (L) 03/07/2021 0649   HCT 38.8 04/02/2020 1019   PLT 186 03/07/2021 0649   PLT 174 04/02/2020 1019   MCV 100.8 (H) 03/07/2021 0649   MCV 97 04/02/2020 1019   MCH 33.1 03/07/2021 0649   MCHC 32.8 03/07/2021 0649   RDW 13.9 03/07/2021 0649   RDW 12.6 04/02/2020 1019   LYMPHSABS 0.6 (L) 10/21/2020 1006   LYMPHSABS 1.1 07/09/2017 1229   MONOABS 0.5 10/21/2020 1006   EOSABS 0.1 10/21/2020 1006   EOSABS 0.0 07/09/2017 1229   BASOSABS 0.1 10/21/2020 1006   BASOSABS 0.0 07/09/2017 1229   BMP Latest Ref Rng & Units 03/07/2021 12/01/2020 11/22/2020  Glucose 70 - 99 mg/dL 86 95 144(H)  BUN 8 - 23 mg/dL 17 21 21   Creatinine 0.61 - 1.24 mg/dL 0.96 1.25 1.17  BUN/Creat Ratio 10 - 24 - 17 -  Sodium 135 - 145 mmol/L 137 140 140  Potassium 3.5 - 5.1 mmol/L 3.6 4.2 3.3(L)  Chloride 98 - 111 mmol/L 104 100 101  CO2 22 - 32 mmol/L 25 25 31   Calcium 8.9 - 10.3 mg/dL 8.6(L) 9.0 9.0   Chest imaging: PET Scan 03/31/21 1. Low level FDG uptake in the small sub solid right upper lobe lesions. Recommend correlation with biopsy results. 2. Low level FDG uptake in the right medial lower lobe process. No discrete mass or focal area hypermetabolism. 3. No enlarged or hypermetabolic mediastinal or hilar lymph nodes or evidence of metastatic disease elsewhere. 4. Advanced vascular disease.  CXR 08/06/20 Patchy bibasilar opacities. No pneumothorax or pleural effusion. Stable cardiomegaly and postsurgical appearance of the cardiomediastinal silhouette. Multilevel spondylosis.  CTA Chest 05/2008 Mild emphysematous changes noted in upper lobes.  PFT: No  flowsheet data found.  Echo: 2019 - Left ventricle: The cavity size was normal. Wall thickness was    increased in a pattern of mild LVH. Indeterminant diastolic    function (atrial fibrillation). Systolic function was normal. The    estimated ejection fraction was in the range of 55% to 60%. Wall    motion was normal; there were no regional wall motion    abnormalities.  - Aortic valve: Bioprosthetic aortic valve. The bioprosthetic    aortic valve appears to function normally. There was no    significant regurgitation. Mean gradient (S): 13 mm Hg. Valve    area (VTI): 1.37 cm^2.  - Aorta: Status post aortic root replacement.  - Mitral valve: Mildly to moderately calcified annulus. There was    trivial regurgitation. Valve area by continuity equation (using    LVOT flow): 2.27 cm^2.  - Left atrium: The atrium was moderately to severely dilated.  - Right ventricle: The cavity size was mildly dilated. Systolic    function was mildly reduced.  - Right atrium: The atrium was moderately to severely dilated.  - Tricuspid valve: Peak RV-RA gradient (S): 33 mm Hg.  - Pulmonary arteries: PA peak pressure: 36 mm Hg (S).   Assessment & Plan:   Non-small cell cancer of right lung ([place])  Hemoptysis  Discussion: [name] is an 85 year old male, former smoker with aortic valve replacement and atrial fibrillation on coumadin who returns to pulmonary clinic for hemoptysis.   He has submassive hemoptysis where he is  coughing up a tablespoon of blood per day. The source of the hemoptysis is not exactly known at this time but possibly from the inflammation occurring in the RLL while on coumadin as there was dilated vasculature noted on recent bronch of the RLL. The bronch and PET scan are reassuring that the RLL lesion is less likely malignant.   We will continue to monitor at this time and have him follow up in 6 months after he completes his radiation therapy for the RUL non-small cell cancer.     Freda Jackson, MD [facility] Office: [phone]    Current Outpatient Medications:    allopurinol (ZYLOPRIM) 300 MG tablet, Take 1 tablet (300 mg total) by mouth daily., Disp: , Rfl:    amLODipine (NORVASC) 5 MG tablet, Take 1 tablet (5 mg total) by mouth daily., Disp: , Rfl:    furosemide (LASIX) 40 MG tablet, Take 1 tablet (40 mg total) by mouth daily., Disp: , Rfl:    Ibuprofen 200 MG CAPS, Take 400 mg by mouth every 6 (six) hours as needed for fever., Disp: , Rfl:    potassium chloride SA (KLOR-CON) 20 MEQ tablet, Take 1 tablet (20 mEq total) by mouth daily., Disp: 90 tablet, Rfl: 3   predniSONE (DELTASONE) 20 MG tablet, Take 0.5 tablets (10 mg total) by mouth daily with breakfast., Disp: , Rfl:    tamsulosin (FLOMAX) 0.4 MG CAPS capsule, TAKE 1 CAPSULE BY MOUTH ONCE DAILY AFTER BREAKFAST, Disp: 90 capsule, Rfl: 1   traMADol (ULTRAM) 50 MG tablet, Take 1 tablet (50 mg total) by mouth 2 (two) times daily. FOR  CHRONIC  PAIN, Disp: 60 tablet, Rfl: 0   traZODone (DESYREL) 50 MG tablet, Take 0.5 tablets (25 mg total) by mouth at bedtime as needed for sleep., Disp: , Rfl:    triamcinolone cream (KENALOG) 0.1 %, Apply 1 application topically 2 (two) times daily as needed (itching). Around the nose, Disp: , Rfl:    VITAMIN D PO, Take 1 tablet by mouth daily., Disp: , Rfl:    warfarin (COUMADIN) 5 MG tablet, Take 1 tablet (5 mg total) by mouth one time only at 4 PM. Take 5 mg Monday and Friday all the other days take 2.5 mg Take as directed by anticoagulation clinic, Disp: , Rfl:

## 2021-04-08 NOTE — Patient Instructions (Signed)
We will see you again in 6 months. Please call us if your hemoptysis changes or increases.

## 2021-04-12 ENCOUNTER — Other Ambulatory Visit: Payer: Self-pay

## 2021-04-12 ENCOUNTER — Ambulatory Visit
Admission: RE | Admit: 2021-04-12 | Discharge: 2021-04-12 | Disposition: A | Discharge status: Home / Self Care | Payer: Medicare Other | Source: Ambulatory Visit | Attending: Radiation Oncology | Admitting: Radiation Oncology

## 2021-04-12 DIAGNOSIS — R918 Other nonspecific abnormal finding of lung field: Secondary | ICD-10-CM

## 2021-04-12 DIAGNOSIS — Z510 Encounter for antineoplastic radiation therapy: Secondary | ICD-10-CM | POA: Diagnosis not present

## 2021-04-12 DIAGNOSIS — C3411 Malignant neoplasm of upper lobe, right bronchus or lung: Secondary | ICD-10-CM | POA: Diagnosis not present

## 2021-04-20 DIAGNOSIS — Z510 Encounter for antineoplastic radiation therapy: Secondary | ICD-10-CM | POA: Diagnosis not present

## 2021-04-20 DIAGNOSIS — C3411 Malignant neoplasm of upper lobe, right bronchus or lung: Secondary | ICD-10-CM | POA: Diagnosis not present

## 2021-04-20 LAB — ACID FAST CULTURE WITH REFLEXED SENSITIVITIES (MYCOBACTERIA)
Acid Fast Culture: NEGATIVE
Acid Fast Culture: NEGATIVE

## 2021-04-21 ENCOUNTER — Ambulatory Visit
Admission: RE | Admit: 2021-04-21 | Discharge: 2021-04-21 | Disposition: A | Discharge status: Home / Self Care | Payer: Medicare Other | Source: Ambulatory Visit | Attending: Radiation Oncology | Admitting: Radiation Oncology

## 2021-04-21 ENCOUNTER — Other Ambulatory Visit: Payer: Self-pay

## 2021-04-21 DIAGNOSIS — C3411 Malignant neoplasm of upper lobe, right bronchus or lung: Secondary | ICD-10-CM | POA: Diagnosis not present

## 2021-04-21 DIAGNOSIS — R918 Other nonspecific abnormal finding of lung field: Secondary | ICD-10-CM

## 2021-04-21 DIAGNOSIS — Z510 Encounter for antineoplastic radiation therapy: Secondary | ICD-10-CM | POA: Diagnosis not present

## 2021-04-22 ENCOUNTER — Ambulatory Visit: Payer: Medicare Other | Admitting: Radiation Oncology

## 2021-04-25 ENCOUNTER — Ambulatory Visit
Admission: RE | Admit: 2021-04-25 | Discharge: 2021-04-25 | Disposition: A | Discharge status: Home / Self Care | Payer: Medicare Other | Source: Ambulatory Visit | Attending: Radiation Oncology | Admitting: Radiation Oncology

## 2021-04-25 ENCOUNTER — Other Ambulatory Visit: Payer: Self-pay

## 2021-04-25 ENCOUNTER — Ambulatory Visit: Payer: Medicare Other | Admitting: Radiation Oncology

## 2021-04-25 DIAGNOSIS — R918 Other nonspecific abnormal finding of lung field: Secondary | ICD-10-CM

## 2021-04-25 DIAGNOSIS — Z510 Encounter for antineoplastic radiation therapy: Secondary | ICD-10-CM | POA: Diagnosis not present

## 2021-04-25 DIAGNOSIS — C3411 Malignant neoplasm of upper lobe, right bronchus or lung: Secondary | ICD-10-CM | POA: Diagnosis not present

## 2021-04-26 ENCOUNTER — Ambulatory Visit: Payer: Medicare Other | Admitting: Radiation Oncology

## 2021-04-26 ENCOUNTER — Ambulatory Visit (INDEPENDENT_AMBULATORY_CARE_PROVIDER_SITE_OTHER): Payer: Medicare Other

## 2021-04-26 DIAGNOSIS — Z7901 Long term (current) use of anticoagulants: Secondary | ICD-10-CM | POA: Diagnosis not present

## 2021-04-26 LAB — POCT INR: INR: 2.5 (ref 2.0–3.0)

## 2021-04-26 NOTE — Progress Notes (Signed)
Medical treatment/procedure(s) were performed by non-physician practitioner and as supervising physician I was immediately available for consultation/collaboration. I agree with above. Mohannad Olivero A Peytin Dechert, MD  

## 2021-04-26 NOTE — Patient Instructions (Addendum)
Pre visit review using our clinic review tool, if applicable. No additional management support is needed unless otherwise documented below in the visit note.  Continue to take 1/2 tablet daily except 1 tablet on Mondays and Fridays.  Re-check in 6 weeks.

## 2021-04-27 ENCOUNTER — Other Ambulatory Visit: Payer: Self-pay

## 2021-04-27 ENCOUNTER — Ambulatory Visit
Admission: RE | Admit: 2021-04-27 | Discharge: 2021-04-27 | Disposition: A | Discharge status: Home / Self Care | Payer: Medicare Other | Source: Ambulatory Visit | Attending: Radiation Oncology | Admitting: Radiation Oncology

## 2021-04-27 DIAGNOSIS — R918 Other nonspecific abnormal finding of lung field: Secondary | ICD-10-CM

## 2021-04-27 DIAGNOSIS — Z510 Encounter for antineoplastic radiation therapy: Secondary | ICD-10-CM | POA: Diagnosis not present

## 2021-04-27 DIAGNOSIS — C3411 Malignant neoplasm of upper lobe, right bronchus or lung: Secondary | ICD-10-CM | POA: Diagnosis not present

## 2021-04-28 ENCOUNTER — Other Ambulatory Visit: Payer: Self-pay | Admitting: Internal Medicine

## 2021-04-28 ENCOUNTER — Ambulatory Visit: Payer: Medicare Other | Admitting: Radiation Oncology

## 2021-04-28 DIAGNOSIS — Z7952 Long term (current) use of systemic steroids: Secondary | ICD-10-CM

## 2021-04-29 ENCOUNTER — Ambulatory Visit
Admission: RE | Admit: 2021-04-29 | Discharge: 2021-04-29 | Disposition: A | Discharge status: Home / Self Care | Payer: Medicare Other | Source: Ambulatory Visit | Attending: Radiation Oncology | Admitting: Radiation Oncology

## 2021-04-29 DIAGNOSIS — R918 Other nonspecific abnormal finding of lung field: Secondary | ICD-10-CM | POA: Diagnosis not present

## 2021-05-03 ENCOUNTER — Encounter: Payer: Self-pay | Admitting: Radiation Oncology

## 2021-05-03 ENCOUNTER — Ambulatory Visit
Admission: RE | Admit: 2021-05-03 | Discharge: 2021-05-03 | Disposition: A | Discharge status: Home / Self Care | Payer: Medicare Other | Source: Ambulatory Visit | Attending: Radiation Oncology | Admitting: Radiation Oncology

## 2021-05-03 ENCOUNTER — Other Ambulatory Visit: Payer: Self-pay

## 2021-05-03 DIAGNOSIS — R918 Other nonspecific abnormal finding of lung field: Secondary | ICD-10-CM

## 2021-05-03 DIAGNOSIS — C3411 Malignant neoplasm of upper lobe, right bronchus or lung: Secondary | ICD-10-CM | POA: Diagnosis not present

## 2021-05-06 ENCOUNTER — Other Ambulatory Visit: Payer: Self-pay | Admitting: Internal Medicine

## 2021-05-17 ENCOUNTER — Telehealth: Payer: Self-pay | Admitting: Cardiology

## 2021-05-17 ENCOUNTER — Encounter: Payer: Self-pay | Admitting: Cardiology

## 2021-05-17 NOTE — Telephone Encounter (Signed)
   [facility] Pre-operative Risk Assessment    Patient Name: [name]  DOB: 1936/04/21 MRN: [id number]  HEARTCARE STAFF:  - IMPORTANT!!!!!! Under Visit Info/Reason for Call, type in Other and utilize the format Clearance MM/DD/YY or Clearance TBD. Do not use dashes or single digits. - Please review there is not already an duplicate clearance open for this procedure. - If request is for dental extraction, please clarify the # of teeth to be extracted. - If the patient is currently at the dentist's office, call Pre-Op Callback Staff (MA/nurse) to input urgent request.  - If the patient is not currently in the dentist office, please route to the Pre-Op pool.  Request for surgical clearance:  What type of surgery is being performed? Two extractions   When is this surgery scheduled? 06/20/21  What type of clearance is required (medical clearance vs. Pharmacy clearance to hold med vs. Both)? Medical   Are there any medications that need to be held prior to surgery and how long? warfarin (COUMADIN)  for 3days prior   Practice name and name of physician performing surgery? [facility]/ Ceasar Mons   What is the office phone number? [phone]   7.   What is the office fax number? [phone]  8.   Anesthesia type (None, local, MAC, general) ? None    [name] 05/17/2021, 12:27 PM  _________________________________________________________________   (provider comments below)

## 2021-05-17 NOTE — Telephone Encounter (Signed)
error 

## 2021-05-18 NOTE — Telephone Encounter (Signed)
   Patient Name: [name]  DOB: 07-Feb-1936 MRN: [id number]  Primary Cardiologist: Kirk Ruths, MD  Chart reviewed as part of pre-operative protocol coverage.   Simple dental extractions are considered low risk procedures per guidelines and generally do not require any specific cardiac clearance. It is also generally accepted that for simple extractions of 1-2 teeth, there is no need to interrupt blood thinner therapy. We generally do not recommend holding warfarin prior to 1-2 tooth extractions.  SBE prophylaxis is required for the patient from a cardiac standpoint due to history of aortic valve replacement. No allergies noted. I will route to our callback team to call patient to notify him of need for antibiotics prior to all dental cleanings/procedures. Callback team, please confirm patient is not allergic to amoxicillin and send in rx for amoxicillin 500mg  take 4 tablets by mouth 30-60 minutes prior to all dental procedures, rx #4 with 2 refills.  I will route this recommendation to the requesting party via Epic fax function and remove from pre-op pool.  Please call with questions.  Charlie Pitter, PA-C 05/18/2021, 8:42 AM

## 2021-05-18 NOTE — Addendum Note (Signed)
Addended by: Gaetano Net on: 05/18/2021 09:16 AM   Modules accepted: Orders

## 2021-05-18 NOTE — Telephone Encounter (Signed)
Left pt a message to call back so he can be made aware of the need for ABX.  Will also route to the requesting Dentist to make them aware and if they speak with the pt, they can relay the message and have the pt call our office.

## 2021-05-18 NOTE — Telephone Encounter (Signed)
Pt returned my call.  He is aware for the need for him to take Amoxicillin 500 mg 4 tablets prior to his Dental procedures.  He states he takes them every time and that he didn't need a prescription, as he said he has several.  Pt was very appreciative of the call.

## 2021-05-23 ENCOUNTER — Other Ambulatory Visit: Payer: Self-pay | Admitting: Internal Medicine

## 2021-05-23 ENCOUNTER — Encounter: Payer: Self-pay | Admitting: Radiation Oncology

## 2021-05-23 DIAGNOSIS — G4700 Insomnia, unspecified: Secondary | ICD-10-CM

## 2021-05-25 ENCOUNTER — Encounter: Payer: Self-pay | Admitting: *Deleted

## 2021-05-27 ENCOUNTER — Ambulatory Visit: Payer: Medicare Other | Admitting: Cardiology

## 2021-06-01 NOTE — Progress Notes (Incomplete)
  Radiation Oncology         [phone] ________________________________  Patient Name: [name] MRN: [id number] DOB: 28-Sep-1935 Referring Physician: Baltazar Apo (Profile Not Attached) Date of Service: 05/03/2021 [facility], [place]  End Of Treatment Note   Diagnoses: C34.11-Malignant neoplasm of upper lobe, right bronchus or lung R91.8-Other nonspecific abnormal finding of lung field  Cancer Staging: Clinical stage I non-small cell lung cancer  Intent: Curative  Radiation Treatment Dates: 04/21/2021 through 05/03/2021 Site Technique Total Dose (Gy) Dose per Fx (Gy) Completed Fx Beam Energies  Lung, Right: Lung_Rt IMRT 50/50 10 5/5 6XFFF   Narrative: The patient tolerated radiation therapy well. Patient denies pain, difficulty or pain with swallowing, chest discomfort, fatigue, nausea, vomiting, diarrhea, or constipation. He does report occasional cough with hemoptysis. Patient stated that he tolerated treatment well and that his appetite is "better than ever".  Plan: The patient will follow-up with radiation oncology in one month .  ________________________________________________ -----------------------------------  Blair Promise, PhD, MD  This document serves as a record of services personally performed by Gery Pray, MD. It was created on his behalf by Roney Mans, a trained medical scribe. The creation of this record is based on the scribe's personal observations and the provider's statements to them. This document has been checked and approved by the attending provider.

## 2021-06-02 ENCOUNTER — Ambulatory Visit
Admission: RE | Admit: 2021-06-02 | Discharge: 2021-06-02 | Disposition: A | Discharge status: Home / Self Care | Payer: Medicare Other | Source: Ambulatory Visit | Attending: Radiation Oncology | Admitting: Radiation Oncology

## 2021-06-02 ENCOUNTER — Telehealth: Payer: Self-pay | Admitting: *Deleted

## 2021-06-02 DIAGNOSIS — R042 Hemoptysis: Secondary | ICD-10-CM | POA: Insufficient documentation

## 2021-06-02 DIAGNOSIS — R918 Other nonspecific abnormal finding of lung field: Secondary | ICD-10-CM | POA: Insufficient documentation

## 2021-06-02 DIAGNOSIS — Z923 Personal history of irradiation: Secondary | ICD-10-CM | POA: Insufficient documentation

## 2021-06-02 DIAGNOSIS — Z79899 Other long term (current) drug therapy: Secondary | ICD-10-CM | POA: Insufficient documentation

## 2021-06-02 HISTORY — DX: Personal history of irradiation: Z92.3

## 2021-06-02 NOTE — Telephone Encounter (Signed)
CALLED PATIENT TO ASK ABOUT RESCHEDULING MISSED FU APPT. FOR 06-02-21, SPOKE WITH PATIENT AND RESCHEDULED FU APPT. FOR 06-09-21 @ 10:15 AM, SPOKE WITH PATIENT AND HE AGREED TO THIS NEW APPT.

## 2021-06-06 DIAGNOSIS — H2513 Age-related nuclear cataract, bilateral: Secondary | ICD-10-CM | POA: Diagnosis not present

## 2021-06-07 ENCOUNTER — Ambulatory Visit: Payer: Medicare Other

## 2021-06-08 NOTE — Progress Notes (Signed)
Radiation Oncology         [phone] ________________________________  Name: [name] MRN: [id number]  Date: 06/09/2021  DOB: 11/11/1935  Follow-Up Visit Note  CC: Isaac Bliss, Rayford Halsted, MD  Collene Gobble, MD    ICD-10-CM   1. Pulmonary nodules  R91.8       Diagnosis: The primary encounter diagnosis was Malignant neoplasm of upper lobe of right lung ([place]). A diagnosis of Pulmonary nodules was also pertinent to this visit.   Clinical stage I non-small cell lung cancer  Interval Since Last Radiation: 1 month and 1 week  Intent: Curative  Radiation Treatment Dates: 04/21/2021 through 05/03/2021 Site Technique Total Dose (Gy) Dose per Fx (Gy) Completed Fx Beam Energies  Lung, Right: Lung_Rt SBRT 50/50 10 5/5 6XFFF   Narrative:  The patient returns today for routine follow-up.  He tolerated his recent radiation treatments quite well with the exception of reporting occasional cough with hemoptysis (which was present prior to treatment).                Pertinent imaging since the patient was last seen includes PET performed on 03/31/21 which demonstrated low level FDG uptake in the small sub solid right upper lobe lesions and right medial lobe process. No discrete mass or focal area hypermetabolism in the right medial lobe process was seen. No enlarged or hypermetabolic mediastinal or hilar lymph nodes or evidence of metastatic disease was seen elsewhere.    The patient followed up with Dr. Erin Fulling (pulmonology) on 04/08/21 in regards to his ongoing hemoptysis. The patient reported at that time to cough up blood intermittently, defined by coughing up a tablespoon of blood per day; he denied any changes in his breathing or issues with SOB, wheezing, or weight loss. Dr. Erin Fulling noted the source of the patient's hemoptysis as clinically not exactly known, but possibly due RLL inflammation while the patient is on coumadin.   The patient continues to have some very low-level  hemoptysis primarily in the mornings upon awakening.  He denies any residual side effects from his SBRT treatment and actually reports no side effects from his SBRT treatment.    Allergies:  has No Known Allergies.  Meds: Current Outpatient Medications  Medication Sig Dispense Refill   allopurinol (ZYLOPRIM) 300 MG tablet Take 1 tablet by mouth once daily 90 tablet 0   amLODipine (NORVASC) 5 MG tablet Take 1 tablet by mouth once daily 90 tablet 0   furosemide (LASIX) 40 MG tablet Take 1 tablet (40 mg total) by mouth daily.     Ibuprofen 200 MG CAPS Take 400 mg by mouth every 6 (six) hours as needed for fever.     potassium chloride SA (KLOR-CON) 20 MEQ tablet Take 1 tablet (20 mEq total) by mouth daily. 90 tablet 3   predniSONE (DELTASONE) 20 MG tablet TAKE 1/2 (ONE-HALF) TABLET BY MOUTH ONCE DAILY WITH BREAKFAST 45 tablet 0   tamsulosin (FLOMAX) 0.4 MG CAPS capsule TAKE 1 CAPSULE BY MOUTH ONCE DAILY AFTER BREAKFAST 90 capsule 1   traMADol (ULTRAM) 50 MG tablet TAKE 1 TABLET BY MOUTH TWICE DAILY FOR  CHRONIC  PAIN 60 tablet 0   traZODone (DESYREL) 50 MG tablet TAKE 1 TABLET BY MOUTH AT BEDTIME AS NEEDED 90 tablet 0   triamcinolone cream (KENALOG) 0.1 % Apply 1 application topically 2 (two) times daily as needed (itching). Around the nose     VITAMIN D PO Take 1 tablet by mouth daily.  warfarin (COUMADIN) 5 MG tablet Take 1 tablet (5 mg total) by mouth one time only at 4 PM. Take 5 mg Monday and Friday all the other days take 2.5 mg Take as directed by anticoagulation clinic     No current facility-administered medications for this encounter.    Physical Findings: The patient is in no acute distress. Patient is alert and oriented.  weight is 168 lb (76.2 kg). His temperature is 97.9 F (36.6 C). His blood pressure is 133/74 and his pulse is 78. His respiration is 18 and oxygen saturation is 97%. .  No significant changes. Lungs are clear to auscultation bilaterally. Heart has regular  rate and rhythm. No palpable cervical, supraclavicular, or axillary adenopathy. Abdomen soft, non-tender, normal bowel sounds.    Lab Findings: Lab Results  Component Value Date   WBC 4.9 03/07/2021   HGB 12.4 (L) 03/07/2021   HCT 37.8 (L) 03/07/2021   MCV 100.8 (H) 03/07/2021   PLT 186 03/07/2021    Radiographic Findings: No results found.  Impression:  The primary encounter diagnosis was Malignant neoplasm of upper lobe of right lung ([place]). A diagnosis of Pulmonary nodules was also pertinent to this visit.   Clinical stage I non-small cell lung cancer  The patient tolerated SBRT extremely well.  No new medical issues at this time.  Clinical exam today shows no evidence of recurrence    Plan: The patient will follow-up in 4 months.  Prior to this follow-up appointment he will undergo a CT scan of the chest to assess his response to SBRT treatments.    ____________________________________  Blair Promise, PhD, MD   This document serves as a record of services personally performed by Gery Pray, MD. It was created on his behalf by Roney Mans, a trained medical scribe. The creation of this record is based on the scribe's personal observations and the provider's statements to them. This document has been checked and approved by the attending provider.

## 2021-06-09 ENCOUNTER — Ambulatory Visit (INDEPENDENT_AMBULATORY_CARE_PROVIDER_SITE_OTHER): Payer: Medicare Other

## 2021-06-09 ENCOUNTER — Encounter: Payer: Self-pay | Admitting: Radiation Oncology

## 2021-06-09 ENCOUNTER — Ambulatory Visit
Admission: RE | Admit: 2021-06-09 | Discharge: 2021-06-09 | Disposition: A | Discharge status: Home / Self Care | Payer: Medicare Other | Source: Ambulatory Visit | Attending: Radiation Oncology | Admitting: Radiation Oncology

## 2021-06-09 ENCOUNTER — Other Ambulatory Visit: Payer: Self-pay

## 2021-06-09 DIAGNOSIS — Z7901 Long term (current) use of anticoagulants: Secondary | ICD-10-CM | POA: Diagnosis not present

## 2021-06-09 DIAGNOSIS — Z79899 Other long term (current) drug therapy: Secondary | ICD-10-CM | POA: Diagnosis not present

## 2021-06-09 DIAGNOSIS — R042 Hemoptysis: Secondary | ICD-10-CM | POA: Diagnosis not present

## 2021-06-09 DIAGNOSIS — Z923 Personal history of irradiation: Secondary | ICD-10-CM | POA: Diagnosis not present

## 2021-06-09 DIAGNOSIS — R918 Other nonspecific abnormal finding of lung field: Secondary | ICD-10-CM | POA: Diagnosis not present

## 2021-06-09 LAB — POCT INR: INR: 2.9 (ref 2.0–3.0)

## 2021-06-09 NOTE — Patient Instructions (Addendum)
Pre visit review using our clinic review tool, if applicable. No additional management support is needed unless otherwise documented below in the visit note.  Continue to take 1/2 tablet daily except 1 tablet on Mondays and Fridays.  Re-check in 6 weeks.

## 2021-06-09 NOTE — Progress Notes (Signed)
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 Preoperative cardiac catheterization revealed normal coronary arteries. He has permanent atrial fibrillation. Previous carotid Dopplers due to bruits revealed 0-39% bilateral stenosis. Admitted 9/18 with back pain for planned laminectomy. However he was found to have spontaneous epidural hematoma which was evacuated. Discussed with Dr Kathyrn Sheriff and he felt anticoagulation could be carefully resumed. Anticoagulation resumed at previous ov.  Monitor April 2019 showed atrial fibrillation with multiple pauses greater than 3 seconds all occurring late p.m. or early a.m. hours. We asked patient to be seen by electrophysiology following results of previous event monitor for consideration of pacemaker. Patient declined. Echocardiogram repeated May 2022 and showed ejection fraction 70 to 75%, moderate biatrial enlargement, mild mitral regurgitation, previous aortic valve replacement with no aortic stenosis and mild aortic insufficiency.  Patient recently diagnosed with lung cancer and undergoing radiation therapy.  Since I last saw him, he has dyspnea on exertion but no orthopnea, PND, pedal edema, chest pain, palpitations or syncope.  He has mild hemoptysis each morning that does not continue throughout the day.  Current Outpatient Medications  Medication Sig Dispense Refill   allopurinol (ZYLOPRIM) 300 MG tablet Take 1 tablet by mouth once daily 90 tablet 0   amLODipine (NORVASC) 5 MG tablet Take 1 tablet by mouth once daily 90 tablet 0   furosemide (LASIX) 40 MG tablet Take 1 tablet (40 mg total) by mouth daily.     Ibuprofen 200 MG CAPS Take 400 mg by mouth every 6 (six) hours as needed for fever.     potassium chloride SA (KLOR-CON) 20 MEQ tablet Take 1 tablet (20 mEq total) by mouth daily. 90 tablet 3   predniSONE (DELTASONE) 20 MG tablet TAKE 1/2 (ONE-HALF)  TABLET BY MOUTH ONCE DAILY WITH BREAKFAST 45 tablet 0   tamsulosin (FLOMAX) 0.4 MG CAPS capsule TAKE 1 CAPSULE BY MOUTH ONCE DAILY AFTER BREAKFAST 90 capsule 1   traMADol (ULTRAM) 50 MG tablet TAKE 1 TABLET BY MOUTH TWICE DAILY FOR  CHRONIC  PAIN 60 tablet 0   traZODone (DESYREL) 50 MG tablet TAKE 1 TABLET BY MOUTH AT BEDTIME AS NEEDED 90 tablet 0   triamcinolone cream (KENALOG) 0.1 % Apply 1 application topically 2 (two) times daily as needed (itching). Around the nose     VITAMIN D PO Take 1 tablet by mouth daily.     warfarin (COUMADIN) 5 MG tablet Take 1 tablet (5 mg total) by mouth one time only at 4 PM. Take 5 mg Monday and Friday all the other days take 2.5 mg Take as directed by anticoagulation clinic     No current facility-administered medications for this visit.     Past Medical History:  Diagnosis Date   Aortic stenosis    Atrial fibrillation (HCC)    Baker cyst    Left knee   DJD (degenerative joint disease)    knees   Dysrhythmia    Eczema    Erectile dysfunction    Gout    History of hiatal hernia    History of radiation therapy    Right lung- 04/21/21-05/03/21 Dr. Gery Pray   HTN (hypertension)    MVP (mitral valve prolapse)    Shortness of breath dyspnea    with activity    Past Surgical History:  Procedure Laterality Date    polyps vocal cord  1994   AORTIC VALVE REPLACEMENT  ~  2004   BRONCHIAL BIOPSY  03/07/2021   Procedure: BRONCHIAL BIOPSIES;  Surgeon: Collene Gobble, MD;  Location: [facility] ENDOSCOPY;  Service: Pulmonary;;   BRONCHIAL BRUSHINGS  03/07/2021   Procedure: BRONCHIAL BRUSHINGS;  Surgeon: Collene Gobble, MD;  Location: [facility] ENDOSCOPY;  Service: Pulmonary;;   BRONCHIAL NEEDLE ASPIRATION BIOPSY  03/07/2021   Procedure: BRONCHIAL NEEDLE ASPIRATION BIOPSIES;  Surgeon: Collene Gobble, MD;  Location: [facility] ENDOSCOPY;  Service: Pulmonary;;   BRONCHIAL WASHINGS  09/21/2020   Procedure: BRONCHIAL WASHINGS;  Surgeon: Freddi Starr, MD;  Location: [facility]  ENDOSCOPY;  Service: Pulmonary;;  BAL    BRONCHIAL WASHINGS  03/07/2021   Procedure: BRONCHIAL WASHINGS;  Surgeon: Collene Gobble, MD;  Location: [facility] ENDOSCOPY;  Service: Pulmonary;;   FIDUCIAL MARKER PLACEMENT  03/07/2021   Procedure: FIDUCIAL MARKER PLACEMENT;  Surgeon: Collene Gobble, MD;  Location: [facility] ENDOSCOPY;  Service: Pulmonary;;   HERNIA REPAIR  ~1978   HERNIA REPAIR  ~1983   LUMBAR LAMINECTOMY/DECOMPRESSION MICRODISCECTOMY N/A 01/15/2015   Procedure: 2 LEVEL DECOMPRESSIVE LUMBAR LAMINECTOMY L3-L4,L4-L5;  Surgeon: Latanya Maudlin, MD;  Location: [facility] ORS;  Service: Orthopedics;  Laterality: N/A;   LUMBAR LAMINECTOMY/DECOMPRESSION MICRODISCECTOMY N/A 05/01/2017   Procedure: Lumbar one-Lumbar two, Lumbar two-Lumbar three, Lumbar three-Lumbar four Laminectomy; Evacuation of hematoma;  Surgeon: Consuella Lose, MD;  Location: [facility];  Service: Neurosurgery;  Laterality: N/A;   right total hip  ~4 years ago   [place] Left ~2009   TOTAL KNEE ARTHROPLASTY Left 04/14/2015   Procedure: LEFT TOTAL KNEE ARTHROPLASTY;  Surgeon: Latanya Maudlin, MD;  Location: [facility] ORS;  Service: Orthopedics;  Laterality: Left;   VIDEO BRONCHOSCOPY N/A 09/21/2020   Procedure: VIDEO BRONCHOSCOPY WITHOUT FLUORO;  Surgeon: Freddi Starr, MD;  Location: [facility] ENDOSCOPY;  Service: Pulmonary;  Laterality: N/A;   VIDEO BRONCHOSCOPY WITH ENDOBRONCHIAL NAVIGATION Right 03/07/2021   Procedure: VIDEO BRONCHOSCOPY WITH ENDOBRONCHIAL NAVIGATION;  Surgeon: Collene Gobble, MD;  Location: [facility] ENDOSCOPY;  Service: Pulmonary;  Laterality: Right;    Social History   Socioeconomic History   Marital status: Divorced    Spouse name: Not on file   Number of children: Not on file   Years of education: Not on file   Highest education level: Not on file  Occupational History   Not on file  Tobacco Use   Smoking status: Former    Years: 30.00    Types: Cigarettes    Quit date: 08/28/2002    Years since quitting: 18.8    Smokeless tobacco: Never  Vaping Use   Vaping Use: Never used  Substance and Sexual Activity   Alcohol use: Yes    Alcohol/week: 3.0 standard drinks    Types: 3 Shots of liquor per week    Comment: social   Drug use: No   Sexual activity: Not on file  Other Topics Concern   Not on file  Social History Narrative   Not on file   Social Determinants of Health   Financial Resource Strain: Not on file  Food Insecurity: Not on file  Transportation Needs: Not on file  Physical Activity: Not on file  Stress: Not on file  Social Connections: Not on file  Intimate Partner Violence: Not on file    Family History  Problem Relation Age of Onset   Diabetes Other    Stroke Other     ROS: Hemoptysis but no fevers or chills, productive cough, dysphasia, odynophagia, melena, hematochezia, dysuria, hematuria, rash, seizure activity, orthopnea, PND, pedal  edema, claudication. Remaining systems are negative.  Physical Exam: Well-developed well-nourished in no acute distress.  Skin is warm and dry.  HEENT is normal.  Neck is supple.  Chest is clear to auscultation with normal expansion.  Cardiovascular exam is irregular, crisp mechanical valve sound Abdominal exam nontender or distended. No masses palpated. Extremities show no edema. neuro grossly intact  A/P  1 permanent atrial fibrillation-patient's heart rate remains controlled on no medications.  We will continue Coumadin with goal INR 2-3.  He has occasional mild hemoptysis in the morning.  If this worsens we may need to consider holding Coumadin though risk of stroke would be higher.  For now we will continue.  I will check hemoglobin.  2 hypertension-patient's blood pressure is controlled.  Continue present medications and follow.  3 status post aortic valve replacement-continue SBE prophylaxis.  4 chronic diastolic congestive heart failure-his volume status appears to be stable at present.  We will continue diuretic at present  dose.  Check potassium and renal function.  5 history of bradycardia-patient declined EP evaluation despite noted on previous pauses.  He has not had syncope.  6 lung cancer-per oncology.  Hemoptysis likely related to lung cancer/ongoing lung process.  He is status post radiation.  Kirk Ruths, MD

## 2021-06-09 NOTE — Progress Notes (Signed)
[name] is here today for follow up post radiation to the lung.  Lung Side: Right, completed treatment on 05/03/21  Does the patient complain of any of the following: Pain:Patient denies pain.  Shortness of breath w/wo exertion: no Cough: no Hemoptysis: Yes, patient reports coughing up blood. Patient states it was a small amount.  Pain with swallowing: no Swallowing/choking concerns: no Appetite: Good Energy Level: mild fatigue Post radiation skin Changes: skin remains intact.     Additional comments if applicable:   Vitals:   06/09/21 1040  BP: 133/74  Pulse: 78  Resp: 18  Temp: 97.9 F (36.6 C)  SpO2: 97%  Weight: 168 lb (76.2 kg)

## 2021-06-09 NOTE — Progress Notes (Signed)
Medical screening examination/treatment/procedure(s) were performed by non-physician practitioner and as supervising physician I was immediately available for consultation/collaboration. I agree with above. Chicquita Mendel, MD   

## 2021-06-12 ENCOUNTER — Other Ambulatory Visit: Payer: Self-pay | Admitting: Radiation Oncology

## 2021-06-12 DIAGNOSIS — C3411 Malignant neoplasm of upper lobe, right bronchus or lung: Secondary | ICD-10-CM

## 2021-06-15 ENCOUNTER — Encounter: Payer: Self-pay | Admitting: Cardiology

## 2021-06-15 ENCOUNTER — Other Ambulatory Visit: Payer: Self-pay

## 2021-06-15 ENCOUNTER — Ambulatory Visit: Payer: Medicare Other | Admitting: Cardiology

## 2021-06-15 VITALS — BP 140/72 | HR 91 | Ht 66.0 in | Wt 171.8 lb

## 2021-06-15 DIAGNOSIS — Z952 Presence of prosthetic heart valve: Secondary | ICD-10-CM

## 2021-06-15 DIAGNOSIS — I10 Essential (primary) hypertension: Secondary | ICD-10-CM

## 2021-06-15 DIAGNOSIS — I4821 Permanent atrial fibrillation: Secondary | ICD-10-CM

## 2021-06-15 DIAGNOSIS — I5032 Chronic diastolic (congestive) heart failure: Secondary | ICD-10-CM | POA: Diagnosis not present

## 2021-06-15 LAB — CBC
Hematocrit: 34.6 % — ABNORMAL LOW (ref 37.5–51.0)
Hemoglobin: 12.2 g/dL — ABNORMAL LOW (ref 13.0–17.7)
MCH: 32.3 pg (ref 26.6–33.0)
MCHC: 35.3 g/dL (ref 31.5–35.7)
MCV: 92 fL (ref 79–97)
Platelets: 176 10*3/uL (ref 150–450)
RBC: 3.78 x10E6/uL — ABNORMAL LOW (ref 4.14–5.80)
RDW: 12.4 % (ref 11.6–15.4)
WBC: 6.1 10*3/uL (ref 3.4–10.8)

## 2021-06-15 LAB — BASIC METABOLIC PANEL
BUN/Creatinine Ratio: 18 (ref 10–24)
BUN: 19 mg/dL (ref 8–27)
CO2: 25 mmol/L (ref 20–29)
Calcium: 9 mg/dL (ref 8.6–10.2)
Chloride: 102 mmol/L (ref 96–106)
Creatinine, Ser: 1.07 mg/dL (ref 0.76–1.27)
Glucose: 105 mg/dL — ABNORMAL HIGH (ref 70–99)
Potassium: 4.2 mmol/L (ref 3.5–5.2)
Sodium: 139 mmol/L (ref 134–144)
eGFR: 69 mL/min/{1.73_m2} (ref >59)

## 2021-06-15 NOTE — Patient Instructions (Signed)
Medication Instructions:  CONTINUE SAME MEDICATIONS *If you need a refill on your cardiac medications before your next appointment, please call your pharmacy*   Lab Work: TODAY If you have labs (blood work) drawn today and your tests are completely normal, you will receive your results only by: [place] (if you have MyChart) OR A paper copy in the mail If you have any lab test that is abnormal or we need to change your treatment, we will call you to review the results.   Follow-Up: At [facility], you and your health needs are our priority.  As part of our continuing mission to provide you with exceptional heart care, we have created designated Provider Care Teams.  These Care Teams include your primary Cardiologist (physician) and Advanced Practice Providers (APPs -  Physician Assistants and Nurse Practitioners) who all work together to provide you with the care you need, when you need it.  We recommend signing up for the patient portal called "MyChart".  Sign up information is provided on this After Visit Summary.  MyChart is used to connect with patients for Virtual Visits (Telemedicine).  Patients are able to view lab/test results, encounter notes, upcoming appointments, etc.  Non-urgent messages can be sent to your provider as well.   To learn more about what you can do with MyChart, go to [email or web address].    Your next appointment:   6 month(s)  The format for your next appointment:   In Person  Provider:   Kirk Ruths, MD

## 2021-06-20 ENCOUNTER — Encounter: Payer: Self-pay | Admitting: *Deleted

## 2021-07-04 ENCOUNTER — Other Ambulatory Visit: Payer: Self-pay | Admitting: Internal Medicine

## 2021-07-13 ENCOUNTER — Telehealth: Payer: Self-pay | Admitting: Pulmonary Disease

## 2021-07-13 MED ORDER — AMOXICILLIN-POT CLAVULANATE 875-125 MG PO TABS: 1.0000 | ORAL_TABLET | Freq: Two times a day (BID) | ORAL | 0 refills | Status: AC

## 2021-07-13 MED ORDER — PREDNISONE 10 MG PO TABS: ORAL_TABLET | ORAL | 0 refills | Status: DC

## 2021-07-13 NOTE — Telephone Encounter (Signed)
Please send in augmentin 875mg  BID for 10 days and   Prednisone taper: 40mg  x 3 days 30mg  x 3 days 20mg  x 3 days 10mg  x 3 days  I understand his frustration as we do not have a good answer to why he is bleeding. We could consider a prolonged steroid taper if this is something he wants to consider. We can schedule a follow up appointment in 1-2 weeks to discuss that in person.  Thanks, Wille Glaser

## 2021-07-13 NOTE — Telephone Encounter (Signed)
Called and spoke with patient. He stated that he is still coughing up blood. He described the blood as being bright red in the mornings and dark red in the evenings. He is concerned because he has completed radiation, had 2 bronchs and numerous CT scans.   He denied any chest pain or discomfort. Also denied any throat pain or fevers.   He wanted to know what else could be causing the blood and how to get it to stop. He stated that this has been going on for a year now.   Pharmacy is [facility]   JD, can you please advise? Thanks!

## 2021-07-13 NOTE — Telephone Encounter (Signed)
Called and spoke with patient. He verbalized understanding of JD's recs. RXs have been sent to his pharmacy. He stated that he wanted to see how this treatment works and he will call us back.   Nothing further needed at time of call.

## 2021-07-26 ENCOUNTER — Telehealth: Payer: Self-pay | Admitting: Pulmonary Disease

## 2021-07-26 NOTE — Telephone Encounter (Signed)
I called the patient and he does not want to take the prednisone and he will keep taking the prednisone his previous provider sent in prednisone. He will call back if he decides to take it again. Nothing further needed.

## 2021-07-26 NOTE — Telephone Encounter (Signed)
Really hard for me on the phone to figure out why somebody is coughing up blood when he has been following with a pulmonary MD trying to address the same question.  Pertinent nutshell it is possible that him being on anticoagulation having had radiation for lung cancer reasons for for potential bleeding.    The question for emergency reasons is how bad is it?.  If he is coughing up more than 1/4 cup or half cup a day then he needs to go to the ER.  There is an emergency procedure called embolization that can potentially stop the bleeding.  Whether this procedure is done electively or not - not sure but possibly -  he can talk about it with Dr. Erin Fulling?  He can also talk to whoever is giving his Coumadin to see if a lower dose or an alternate medication could be done.  I do not know enough of the reason why he is on Coumadin  For the moment -if you want to try prednisone again I could prescribe that.  This is the regimen below the Dr. Raeanne Barry prescribed originally. Prednisone taper: 40mg  x 3 days 30mg  x 3 days 20mg  x 3 days 10mg  x 3 days

## 2021-07-26 NOTE — Telephone Encounter (Signed)
Spoke with pt who states that he is still coughing up blood and has been for a year now. Pt states the amount of blood reduced somewhat while on Prednisone but the increased when he finished Prednisone taper (Saturday  07/23/21). Pt states that he is frustrated that Dr. Erin Fulling has not been able to figure out why this is happening. Pt was scheduled for OV with Dr. Erin Fulling on 07/29/21 per Dr. August Albino last telephone encounter. Dr. Chase Caller could you please advise as Dr. Erin Fulling is unavailable.

## 2021-07-28 ENCOUNTER — Other Ambulatory Visit: Payer: Self-pay

## 2021-07-28 ENCOUNTER — Telehealth: Payer: Self-pay

## 2021-07-28 ENCOUNTER — Ambulatory Visit (INDEPENDENT_AMBULATORY_CARE_PROVIDER_SITE_OTHER): Payer: Medicare Other

## 2021-07-28 DIAGNOSIS — Z7901 Long term (current) use of anticoagulants: Secondary | ICD-10-CM

## 2021-07-28 LAB — POCT INR: INR: 2.7 (ref 2.0–3.0)

## 2021-07-28 NOTE — Progress Notes (Addendum)
Pt reports he has had hemoptysis for about one year and his pulmonary and cardiology physicians are aware of it and wondering if his INR range should be reduced or even stopping his coumadin.  Will send cardiology and pulmonary and the PCP a msg requesting if a change to INR goal should be made.  Continue to take 1/2 tablet daily except 1 tablet on Mondays and Fridays.  Re-check in 6 weeks.   Per cardiology, INR goal should be changed to 2.0-2.5.  Made change to note. Change weekly dose to take 1/2 tablet daily  except take 1 tablet on Mondays.  Re-check in 2 weeks.

## 2021-07-28 NOTE — Patient Instructions (Addendum)
Pre visit review using our clinic review tool, if applicable. No additional management support is needed unless otherwise documented below in the visit note.  Advised to change weekly dose to take 1/2 tablet daily except take 1 tablet on Mondays.  Re-check in 2 weeks.

## 2021-07-28 NOTE — Progress Notes (Signed)
Patient ID: [name], male   DOB: [birth year], 85 y.o.   MRN: [id number] Medical screening examination/treatment/procedure(s) were performed by non-physician practitioner and as supervising physician I was immediately available for consultation/collaboration.  I agree with above. Cathlean Cower, MD

## 2021-07-28 NOTE — Telephone Encounter (Signed)
Per cardiology, Would change INR goal to 2-2.5. If hemoptysis persists, might need to DC though he would need to understand higher risk of embolic event including CVA  Kirk Ruths   Per PCP, I would defer to cardiology in regards to INR target.     Pt has been contacted and given instructions and RS for 2 wks.

## 2021-07-28 NOTE — Telephone Encounter (Signed)
Pt in this morning for coumadin clinic with INR of 2.7, within range.  Pt reported he has had hemoptysis for one year and cardiology and pulmonary are aware and he has recently seen pulmonary and they think his INR range should be no higher than 2.5. Pt wanted to know if his INR range could be changed at the visit today. Advised this nurse would need to f/u with PCP, cardiology (Dr. Kirk Ruths) and pulmonary (Dr. Freda Jackson). In last cardiology note on 10/19 Dr. Stanford Breed noted the hemoptysis and that if it worsens they may consider holding warfarin. Pt did not report it was any worse but that he just wanted it to stop and it was very worrisome for him. Advised a msg would be sent to Dr. Stanford Breed and Dr. Erin Fulling as well as PCP to advise if change in current INR goal of 2-3, is recommended. Advised pt they may want to change his range to 2.0-2.5 or 1.5-2.5. Advised this nurse will f/u with him as soon as a msg is returned. Pt very appreciative and verbalized understanding.

## 2021-07-29 ENCOUNTER — Encounter: Payer: Self-pay | Admitting: Pulmonary Disease

## 2021-07-29 ENCOUNTER — Ambulatory Visit: Payer: Medicare Other | Admitting: Pulmonary Disease

## 2021-07-29 VITALS — BP 118/60 | HR 79 | Temp 97.9°F | Ht 66.0 in | Wt 168.6 lb

## 2021-07-29 DIAGNOSIS — R042 Hemoptysis: Secondary | ICD-10-CM

## 2021-07-29 DIAGNOSIS — C3491 Malignant neoplasm of unspecified part of right bronchus or lung: Secondary | ICD-10-CM | POA: Diagnosis not present

## 2021-07-29 DIAGNOSIS — R918 Other nonspecific abnormal finding of lung field: Secondary | ICD-10-CM

## 2021-07-29 NOTE — Patient Instructions (Signed)
We will monitor your hemoptysis for now as the INR goal has changed with the coumadin.   Please call us if you would like [place] to check further blood work for other inflammatory conditions leading to your bleeding.   I will speak with our interventional radiology team to see if a biopsy can even be performed based on location of the right lower lobe lesion or if we can embolize an artery to that area.

## 2021-07-29 NOTE — Progress Notes (Signed)
Synopsis: Referred in 08/2020 for hemoptysis   Subjective:   PATIENT ID: [name]: male DOB: [birth year], MRN: [id number]  HPI  Chief Complaint  Patient presents with   Follow-up    Pt states that he is still coughing up blood and has ben for 1 year.    [name] is an 85 year old male, former smoker with aortic valve replacement and atrial fibrillation on coumadin who returns to pulmonary clinic for hemoptysis.   He continues to cough up blood on a daily basis. He denies any changes in his breathing or issues with shortness of breath or wheezing. Denies any weight loss.  He has completed SBRT for the RUL nodule.   Discussions held with his cardiology team and plan is to decrease his goal INR range and monitor for any improvement in the hemoptysis.   He did not notice improvement in the hemoptysis after most recent prednisone taper. He remains on prednisone 10mg  daily for her RA.   Past Medical History:  Diagnosis Date   Aortic stenosis    Atrial fibrillation (HCC)    Baker cyst    Left knee   DJD (degenerative joint disease)    knees   Dysrhythmia    Eczema    Erectile dysfunction    Gout    History of hiatal hernia    History of radiation therapy    Right lung- 04/21/21-05/03/21 Dr. Gery Pray   HTN (hypertension)    MVP (mitral valve prolapse)    Shortness of breath dyspnea    with activity     Family History  Problem Relation Age of Onset   Diabetes Other    Stroke Other      Social History   Socioeconomic History   Marital status: Divorced    Spouse name: Not on file   Number of children: Not on file   Years of education: Not on file   Highest education level: Not on file  Occupational History   Not on file  Tobacco Use   Smoking status: Former    Years: 30.00    Types: Cigarettes    Quit date: 08/28/2002    Years since quitting: 18.9   Smokeless tobacco: Never  Vaping Use   Vaping Use: Never used  Substance and Sexual  Activity   Alcohol use: Yes    Alcohol/week: 3.0 standard drinks    Types: 3 Shots of liquor per week    Comment: social   Drug use: No   Sexual activity: Not on file  Other Topics Concern   Not on file  Social History Narrative   Not on file   Social Determinants of Health   Financial Resource Strain: Not on file  Food Insecurity: Not on file  Transportation Needs: Not on file  Physical Activity: Not on file  Stress: Not on file  Social Connections: Not on file  Intimate Partner Violence: Not on file     No Known Allergies   Outpatient Medications Prior to Visit  Medication Sig Dispense Refill   allopurinol (ZYLOPRIM) 300 MG tablet Take 1 tablet by mouth once daily 90 tablet 0   amLODipine (NORVASC) 5 MG tablet Take 1 tablet by mouth once daily 90 tablet 0   furosemide (LASIX) 40 MG tablet Take 1 tablet (40 mg total) by mouth daily.     Ibuprofen 200 MG CAPS Take 400 mg by mouth every 6 (six) hours as needed for fever.  potassium chloride SA (KLOR-CON) 20 MEQ tablet Take 1 tablet (20 mEq total) by mouth daily. 90 tablet 3   predniSONE (DELTASONE) 20 MG tablet TAKE 1/2 (ONE-HALF) TABLET BY MOUTH ONCE DAILY WITH BREAKFAST 45 tablet 0   tamsulosin (FLOMAX) 0.4 MG CAPS capsule TAKE 1 CAPSULE BY MOUTH ONCE DAILY AFTER BREAKFAST 90 capsule 1   traMADol (ULTRAM) 50 MG tablet TAKE 1 TABLET BY MOUTH TWICE DAILY FOR  CHRONIC  PAIN 60 tablet 0   traZODone (DESYREL) 50 MG tablet TAKE 1 TABLET BY MOUTH AT BEDTIME AS NEEDED 90 tablet 0   triamcinolone cream (KENALOG) 0.1 % Apply 1 application topically 2 (two) times daily as needed (itching). Around the nose     VITAMIN D PO Take 1 tablet by mouth daily.     warfarin (COUMADIN) 5 MG tablet Take 1 tablet (5 mg total) by mouth one time only at 4 PM. Take 5 mg Monday and Friday all the other days take 2.5 mg Take as directed by anticoagulation clinic     amoxicillin (AMOXIL) 500 MG capsule SMARTSIG:4 Capsule(s) By Mouth Once     predniSONE  (DELTASONE) 10 MG tablet Take 4 tabs by mouth for 3 days, then 3 for 3 days, 2 for 3 days, 1 for 3 days and stop 30 tablet 0   No facility-administered medications prior to visit.    Review of Systems  Constitutional:  Negative for chills, fever, malaise/fatigue and weight loss.  HENT:  Negative for nosebleeds.   Eyes: Negative.   Respiratory:  Positive for cough and hemoptysis. Negative for sputum production, shortness of breath and wheezing.   Cardiovascular:  Negative for chest pain, palpitations, orthopnea, claudication, leg swelling and PND.  Gastrointestinal:  Negative for abdominal pain, blood in stool, heartburn, nausea and vomiting.  Genitourinary:  Negative for hematuria.  Musculoskeletal:  Positive for joint pain.  Skin:  Negative for rash.  Neurological:  Negative for dizziness, weakness and headaches.  Endo/Heme/Allergies:  Does not bruise/bleed easily.  Psychiatric/Behavioral: Negative.     Objective:   Vitals:   07/29/21 1119  BP: 118/60  Pulse: 79  Temp: 97.9 F (36.6 C)  TempSrc: Oral  SpO2: 95%  Weight: 168 lb 9.6 oz (76.5 kg)  Height: 5\' 6"  (1.676 m)   Physical Exam Constitutional:      Appearance: Normal appearance. He is normal weight.  HENT:     Head: Normocephalic and atraumatic.     Nose: Nose normal. No congestion or rhinorrhea.     Mouth/Throat:     Mouth: Mucous membranes are moist.     Pharynx: Oropharynx is clear. No oropharyngeal exudate or posterior oropharyngeal erythema.  Eyes:     General: No scleral icterus.    Conjunctiva/sclera: Conjunctivae normal.     Pupils: Pupils are equal, round, and reactive to light.  Cardiovascular:     Rate and Rhythm: Normal rate. Rhythm irregular.     Pulses: Normal pulses.     Heart sounds: Normal heart sounds.  Pulmonary:     Effort: Pulmonary effort is normal.     Breath sounds: Rales (bibasilar) present. No wheezing or rhonchi.  Abdominal:     General: Bowel sounds are normal.     Palpations:  Abdomen is soft.  Musculoskeletal:     Right lower leg: No edema.     Left lower leg: No edema.  Lymphadenopathy:     Cervical: No cervical adenopathy.  Skin:    General: Skin is warm and dry.  Neurological:     General: No focal deficit present.     Mental Status: He is alert.  Psychiatric:        Mood and Affect: Mood normal.        Behavior: Behavior normal.        Thought Content: Thought content normal.        Judgment: Judgment normal.   CBC    Component Value Date/Time   WBC 6.1 06/15/2021 1104   WBC 4.9 03/07/2021 0649   RBC 3.78 (L) 06/15/2021 1104   RBC 3.75 (L) 03/07/2021 0649   HGB 12.2 (L) 06/15/2021 1104   HCT 34.6 (L) 06/15/2021 1104   PLT 176 06/15/2021 1104   MCV 92 06/15/2021 1104   MCH 32.3 06/15/2021 1104   MCH 33.1 03/07/2021 0649   MCHC 35.3 06/15/2021 1104   MCHC 32.8 03/07/2021 0649   RDW 12.4 06/15/2021 1104   LYMPHSABS 0.6 (L) 10/21/2020 1006   LYMPHSABS 1.1 07/09/2017 1229   MONOABS 0.5 10/21/2020 1006   EOSABS 0.1 10/21/2020 1006   EOSABS 0.0 07/09/2017 1229   BASOSABS 0.1 10/21/2020 1006   BASOSABS 0.0 07/09/2017 1229   BMP Latest Ref Rng & Units 06/15/2021 03/07/2021 12/01/2020  Glucose 70 - 99 mg/dL 105(H) 86 95  BUN 8 - 27 mg/dL 19 17 21   Creatinine 0.76 - 1.27 mg/dL 1.07 0.96 1.25  BUN/Creat Ratio 10 - 24 18 - 17  Sodium 134 - 144 mmol/L 139 137 140  Potassium 3.5 - 5.2 mmol/L 4.2 3.6 4.2  Chloride 96 - 106 mmol/L 102 104 100  CO2 20 - 29 mmol/L 25 25 25   Calcium 8.6 - 10.2 mg/dL 9.0 8.6(L) 9.0   Chest imaging: PET Scan 03/31/21 1. Low level FDG uptake in the small sub solid right upper lobe lesions. Recommend correlation with biopsy results. 2. Low level FDG uptake in the right medial lower lobe process. No discrete mass or focal area hypermetabolism. 3. No enlarged or hypermetabolic mediastinal or hilar lymph nodes or evidence of metastatic disease elsewhere. 4. Advanced vascular disease.  CXR 08/06/20 Patchy bibasilar  opacities. No pneumothorax or pleural effusion. Stable cardiomegaly and postsurgical appearance of the cardiomediastinal silhouette. Multilevel spondylosis.  CTA Chest 05/2008 Mild emphysematous changes noted in upper lobes.  PFT: No flowsheet data found.  Echo: 2019 - Left ventricle: The cavity size was normal. Wall thickness was    increased in a pattern of mild LVH. Indeterminant diastolic    function (atrial fibrillation). Systolic function was normal. The    estimated ejection fraction was in the range of 55% to 60%. Wall    motion was normal; there were no regional wall motion    abnormalities.  - Aortic valve: Bioprosthetic aortic valve. The bioprosthetic    aortic valve appears to function normally. There was no    significant regurgitation. Mean gradient (S): 13 mm Hg. Valve    area (VTI): 1.37 cm^2.  - Aorta: Status post aortic root replacement.  - Mitral valve: Mildly to moderately calcified annulus. There was    trivial regurgitation. Valve area by continuity equation (using    LVOT flow): 2.27 cm^2.  - Left atrium: The atrium was moderately to severely dilated.  - Right ventricle: The cavity size was mildly dilated. Systolic    function was mildly reduced.  - Right atrium: The atrium was moderately to severely dilated.  - Tricuspid valve: Peak RV-RA gradient (S): 33 mm Hg.  - Pulmonary arteries: PA peak pressure: 36  mm Hg (S).   Assessment & Plan:   Non-small cell cancer of right lung (HCC)  Hemoptysis  Right lower lobe pulmonary infiltrate  Discussion: [name] is an 85 year old male, former smoker with aortic valve replacement and atrial fibrillation on coumadin who returns to pulmonary clinic for hemoptysis.   He has submassive hemoptysis where he is coughing up a tablespoon of blood per day. The source of the hemoptysis is not exactly known at this time but possibly from the inflammation occurring in the RLL while on coumadin as there was dilated  vasculature noted on recent bronch of the RLL. The bronch and PET scan are reassuring that the RLL lesion is less likely malignant.   We will continue to monitor the patient's hemoptysis with the new INR goal of 2-2.5.  We will not plan on any further antibiotic for high-dose prednisone tapers as these have not helped in the past.  We discussed possibly pursuing CT-guided lung biopsy via interventional radiology in order to get a better idea of the right lower lobe lesion.  Patient wishes to hold off on further CT scans or other procedures at this time.  Follow-up in 2 months.  Freda Jackson, MD [facility] Office: [phone]    Current Outpatient Medications:    allopurinol (ZYLOPRIM) 300 MG tablet, Take 1 tablet by mouth once daily, Disp: 90 tablet, Rfl: 0   amLODipine (NORVASC) 5 MG tablet, Take 1 tablet by mouth once daily, Disp: 90 tablet, Rfl: 0   furosemide (LASIX) 40 MG tablet, Take 1 tablet (40 mg total) by mouth daily., Disp: , Rfl:    Ibuprofen 200 MG CAPS, Take 400 mg by mouth every 6 (six) hours as needed for fever., Disp: , Rfl:    potassium chloride SA (KLOR-CON) 20 MEQ tablet, Take 1 tablet (20 mEq total) by mouth daily., Disp: 90 tablet, Rfl: 3   predniSONE (DELTASONE) 20 MG tablet, TAKE 1/2 (ONE-HALF) TABLET BY MOUTH ONCE DAILY WITH BREAKFAST, Disp: 45 tablet, Rfl: 0   tamsulosin (FLOMAX) 0.4 MG CAPS capsule, TAKE 1 CAPSULE BY MOUTH ONCE DAILY AFTER BREAKFAST, Disp: 90 capsule, Rfl: 1   traMADol (ULTRAM) 50 MG tablet, TAKE 1 TABLET BY MOUTH TWICE DAILY FOR  CHRONIC  PAIN, Disp: 60 tablet, Rfl: 0   traZODone (DESYREL) 50 MG tablet, TAKE 1 TABLET BY MOUTH AT BEDTIME AS NEEDED, Disp: 90 tablet, Rfl: 0   triamcinolone cream (KENALOG) 0.1 %, Apply 1 application topically 2 (two) times daily as needed (itching). Around the nose, Disp: , Rfl:    VITAMIN D PO, Take 1 tablet by mouth daily., Disp: , Rfl:    warfarin (COUMADIN) 5 MG tablet, Take 1 tablet (5  mg total) by mouth one time only at 4 PM. Take 5 mg Monday and Friday all the other days take 2.5 mg Take as directed by anticoagulation clinic, Disp: , Rfl:

## 2021-08-08 ENCOUNTER — Ambulatory Visit (INDEPENDENT_AMBULATORY_CARE_PROVIDER_SITE_OTHER): Payer: Medicare Other | Admitting: Acute Care

## 2021-08-08 ENCOUNTER — Telehealth: Payer: Self-pay | Admitting: Acute Care

## 2021-08-08 ENCOUNTER — Telehealth: Payer: Self-pay

## 2021-08-08 ENCOUNTER — Ambulatory Visit (INDEPENDENT_AMBULATORY_CARE_PROVIDER_SITE_OTHER): Payer: Medicare Other

## 2021-08-08 ENCOUNTER — Other Ambulatory Visit: Payer: Self-pay

## 2021-08-08 ENCOUNTER — Encounter: Payer: Self-pay | Admitting: Acute Care

## 2021-08-08 VITALS — BP 142/70 | HR 83 | Temp 98.1°F | Ht 66.0 in | Wt 173.4 lb

## 2021-08-08 DIAGNOSIS — R0600 Dyspnea, unspecified: Secondary | ICD-10-CM

## 2021-08-08 DIAGNOSIS — R042 Hemoptysis: Secondary | ICD-10-CM

## 2021-08-08 DIAGNOSIS — R0602 Shortness of breath: Secondary | ICD-10-CM | POA: Diagnosis not present

## 2021-08-08 MED ORDER — AMOXICILLIN-POT CLAVULANATE 875-125 MG PO TABS: 1.0000 | ORAL_TABLET | Freq: Two times a day (BID) | ORAL | 0 refills | Status: DC

## 2021-08-08 NOTE — Telephone Encounter (Signed)
Please call patient and let him know his CXR showed Left greater than right lower lobe pneumonia. Possible small associated pleural effusion(s). This is new.  Please send in Augmentin 875 / 125 one tablet twice daily x 7 days. Have him eat activia yogurt daily, or an over the counter probiotic while he is on antibiotics.  Follow up with Dr. Erin Fulling in 3-4 weeks with CXR to ensure improvement. Please ask patient to call the office sooner if he gets worse not better, or seek Emergency care.   Thanks so much

## 2021-08-08 NOTE — Patient Instructions (Addendum)
It is good to see you today.  We will get a  CXR now. We will call you with the results. Based on the results we will determine the appropriate treatment.  Follow up with Dr. Erin Fulling in 1 week.  Call us if you need [place] sooner.  Please contact office for sooner follow up if symptoms do not improve or worsen or seek emergency care   Addendum: Augmentin 875 mg BID x 14 days. He will follow up with Dr. Erin Fulling in 1 week to ensure he is improving.  ? Considering a swallow eval. At follow up. These instructions were phoned to the patient 08/08/2021 at 17:28 pm. He verbalized understanding

## 2021-08-08 NOTE — Telephone Encounter (Signed)
Pt reports he cannot make apt on 12/15 and wants to RS.  Coumadin clinic is not open next week. Advised pt he would need to be RS for the week after. Pt agreed. Advised if any changes to contact the office. Pt verbalized understanding.   RS pt for 12/27

## 2021-08-08 NOTE — Telephone Encounter (Signed)
Called patient. He verbalized understanding and appreciation. RX has been sent in.   Nothing further needed at time of call.

## 2021-08-08 NOTE — Progress Notes (Addendum)
History of Present Illness [name] is a 85 y.o. male former smoker ( Quit 2004) with Clinical stage I non-small cell lung cancer which was treated with SBRT August 25- September 6th, and hemoptysis ( On Coumadin for atrial fibrillation ). He is followed by Dr. Erin Francis.   08/08/2021 Pt. Presents for follow up. He was seen by Dr. Erin Francis 07/29/2021 for hemoptysis. He is on Coumadin for aortic valve replacement and atrial fibrillation. He had some submassive hemoptysis where he was coughing up a tablespoon of blood per day.Plan was to set a new INR goal of 2-2.5. Plan was for no further antibiotic for high-dose prednisone tapers as these have not helped in the past.  Dr. Erin Francis  discussed possibly pursuing CT-guided lung biopsy via interventional radiology in order to get a better idea of the right lower lobe lesion.  Patient wishes to hold off on further CT scans or other procedures at this time.  He presents today for shortness of breath since last Friday, with some chest congestion. He continues to cough up blood about 1 Tablespoon a day.  He does note that the color is lighter red. He denies any fever. He denies any chest pain. He states he has been taking Lasix 40 mg daily. He has no ankle swelling at present. He is still on his 10 mg prednisone daily. He states he has chest congestion. Secretions are yellow to white with some blood as noted above. He states they are thick.  He finished his SBRT  August 25- September 6th.  Next INR is 08/12/2021. On the lower dose of coumadin to meet the INR goal of 2-2.5  Test Results: PET Scan 03/31/21 1. Low level FDG uptake in the small sub solid right upper lobe lesions. Recommend correlation with biopsy results. 2. Low level FDG uptake in the right medial lower lobe process. No discrete mass or focal area hypermetabolism. 3. No enlarged or hypermetabolic mediastinal or hilar lymph nodes or evidence of metastatic disease elsewhere. 4. Advanced  vascular disease.  CXR 08/06/20 Patchy bibasilar opacities. No pneumothorax or pleural effusion. Stable cardiomegaly and postsurgical appearance of the cardiomediastinal silhouette. Multilevel spondylosis.   CTA Chest 05/2008 Mild emphysematous changes noted in upper lobes.  CBC Latest Ref Rng & Units 06/15/2021 03/07/2021 10/21/2020  WBC 3.4 - 10.8 x10E3/uL 6.1 4.9 8.3  Hemoglobin 13.0 - 17.7 g/dL 12.2(L) 12.4(L) 13.5  Hematocrit 37.5 - 51.0 % 34.6(L) 37.8(L) 40.3  Platelets 150 - 450 x10E3/uL 176 186 215.0    BMP Latest Ref Rng & Units 06/15/2021 03/07/2021 12/01/2020  Glucose 70 - 99 mg/dL 105(H) 86 95  BUN 8 - 27 mg/dL 19 17 21   Creatinine 0.76 - 1.27 mg/dL 1.07 0.96 1.25  BUN/Creat Ratio 10 - 24 18 - 17  Sodium 134 - 144 mmol/L 139 137 140  Potassium 3.5 - 5.2 mmol/L 4.2 3.6 4.2  Chloride 96 - 106 mmol/L 102 104 100  CO2 20 - 29 mmol/L 25 25 25   Calcium 8.6 - 10.2 mg/dL 9.0 8.6(L) 9.0    BNP    Component Value Date/Time   BNP 374.9 (H) 06/06/2015 1227    ProBNP    Component Value Date/Time   PROBNP 1,126 (H) 11/23/2017 0951    PFT No results found for: FEV1PRE, FEV1POST, FVCPRE, FVCPOST, TLC, DLCOUNC, PREFEV1FVCRT, PSTFEV1FVCRT  No results found.   Past medical hx Past Medical History:  Diagnosis Date   Aortic stenosis    Atrial fibrillation ([place])  Baker cyst    Left knee   DJD (degenerative joint disease)    knees   Dysrhythmia    Eczema    Erectile dysfunction    Gout    History of hiatal hernia    History of radiation therapy    Right lung- 04/21/21-05/03/21 Dr. Gery Francis   HTN (hypertension)    MVP (mitral valve prolapse)    Shortness of breath dyspnea    with activity     Social History   Tobacco Use   Smoking status: Former    Years: 30.00    Types: Cigarettes    Quit date: 08/28/2002    Years since quitting: 18.9   Smokeless tobacco: Never  Vaping Use   Vaping Use: Never used  Substance Use Topics   Alcohol use: Yes     Alcohol/week: 3.0 standard drinks    Types: 3 Shots of liquor per week    Comment: social   Drug use: No    JaredFrancis reports that he quit smoking about 18 years ago. His smoking use included cigarettes. He has never used smokeless tobacco. He reports current alcohol use of about 3.0 standard drinks per week. He reports that he does not use drugs.  Tobacco Cessation: Former smoker, Quit 2004 30 pack year smoking history   Past surgical hx, Family hx, Social hx all reviewed.  Current Outpatient Medications on File Prior to Visit  Medication Sig   allopurinol (ZYLOPRIM) 300 MG tablet Take 1 tablet by mouth once daily   amLODipine (NORVASC) 5 MG tablet Take 1 tablet by mouth once daily   furosemide (LASIX) 40 MG tablet Take 1 tablet (40 mg total) by mouth daily.   Ibuprofen 200 MG CAPS Take 400 mg by mouth every 6 (six) hours as needed for fever.   potassium chloride SA (KLOR-CON) 20 MEQ tablet Take 1 tablet (20 mEq total) by mouth daily.   predniSONE (DELTASONE) 20 MG tablet TAKE 1/2 (ONE-HALF) TABLET BY MOUTH ONCE DAILY WITH BREAKFAST   tamsulosin (FLOMAX) 0.4 MG CAPS capsule TAKE 1 CAPSULE BY MOUTH ONCE DAILY AFTER BREAKFAST   traMADol (ULTRAM) 50 MG tablet TAKE 1 TABLET BY MOUTH TWICE DAILY FOR  CHRONIC  PAIN   traZODone (DESYREL) 50 MG tablet TAKE 1 TABLET BY MOUTH AT BEDTIME AS NEEDED   triamcinolone cream (KENALOG) 0.1 % Apply 1 application topically 2 (two) times daily as needed (itching). Around the nose   VITAMIN D PO Take 1 tablet by mouth daily.   warfarin (COUMADIN) 5 MG tablet Take 1 tablet (5 mg total) by mouth one time only at 4 PM. Take 5 mg Monday and Friday all the other days take 2.5 mg Take as directed by anticoagulation clinic   No current facility-administered medications on file prior to visit.     No Known Allergies  Review Of Systems:  Constitutional:   No  weight loss, night sweats,  Fevers, chills, +fatigue, or  lassitude.  HEENT:   No headaches,   Difficulty swallowing,  Tooth/dental problems, or  Sore throat,                No sneezing, itching, ear ache, nasal congestion, post nasal drip,   CV:  No chest pain,  Orthopnea, PND, swelling in lower extremities, anasarca, dizziness, palpitations, syncope.   GI  No heartburn, indigestion, abdominal pain, nausea, vomiting, diarrhea, change in bowel habits, loss of appetite, bloody stools.   Resp: + shortness of breath with exertion or at  rest.  No excess mucus, no productive cough,  No non-productive cough,  No coughing up of blood.  No change in color of mucus.  No wheezing.  No chest wall deformity  Skin: no rash or lesions.  GU: no dysuria, change in color of urine, no urgency or frequency.  No flank pain, no hematuria   MS:  No joint pain or swelling.  No decreased range of motion.  No back pain.  Psych:  No change in mood or affect. No depression or anxiety.  No memory loss.   Vital Signs BP (!) 142/70 (BP Location: Left Arm, Patient Position: Sitting, Cuff Size: Normal)   Pulse 83   Temp 98.1 F (36.7 C) (Oral)   Ht 5\' 6"  (1.676 m)   Wt 173 lb 6.4 oz (78.7 kg)   SpO2 91%   BMI 27.99 kg/m    Physical Exam:  General- No distress,  A&Ox3, pleasant ENT: No sinus tenderness, TM clear, pale nasal mucosa, no oral exudate,no post nasal drip, no LAN Cardiac: S1, S2, regular rate and rhythm, no murmur Chest: No wheeze/ rales/ dullness; no accessory muscle use, no nasal flaring, no sternal retractions Abd.: Soft Non-tender, ND, BS +, Body mass index is 27.99 kg/m.  Ext: No clubbing cyanosis, edema Neuro:  normal strength, MAE x 4. A&O x 3 Skin: No rashes, warm and dry, No lesions Psych: normal mood and behavior   Assessment/Plan  Dyspnea on exertion ( worsening compared to  baseline) Finished SBRT 04/2021 Hemoptysis on Coumadin for a fib New Infiltrates per CXR Plan We will get a  CXR now. We will call you with the results. Based on the results we will determine the  appropriate treatment.  Follow up with Dr. Erin Francis in 1 week.  Call us if you need [place] sooner.  Please contact office for sooner follow up if symptoms do not improve or worsen or seek emergency care   Addendum: CXR resulted as  Left lower lobe consolidation with some air bronchograms. Small left pleural effusion not excluded. New reticulonodular opacity also at the right lung base. As patient is symptomatic with increased dyspnea, we will treat with Augmentin 875 mg BID x 14 days. He will follow up with Dr. Erin Francis in 1 week to ensure he is improving.  ? Considering a swallow eval. At follow up.  08/09/2021 I spoke with Dr. Erin Francis this am. We will ask patient to cone to the office for some additional lab draws.  CBC, BMET, ANA, ANCA, RF, Mpo/ pr-3. We will call the patient once the labs result.   I spent 45 minutes dedicated to the care of this patient on the date of this encounter to include pre-visit review of records, face-to-face time with the patient discussing conditions above, post visit ordering of testing, clinical documentation with the electronic health record, making appropriate referrals as documented, and communicating necessary information to the patient's healthcare team.    Magdalen Spatz, NP 08/08/2021  11:04 AM

## 2021-08-08 NOTE — Telephone Encounter (Signed)
Called and spoke with patient. He verbalized understanding of results. He mentioned on the phone that he took Augmentin for one week before and it did not work. He ended up being on the abx for 2 weeks before he worked. He is requesting to be on the Augmentin for 2 weeks.   Judson Roch, can you please advise? Thanks.

## 2021-08-08 NOTE — Addendum Note (Signed)
Addended by: Valerie Salts on: 08/08/2021 04:35 PM   Modules accepted: Orders

## 2021-08-09 ENCOUNTER — Telehealth: Payer: Self-pay | Admitting: Acute Care

## 2021-08-09 ENCOUNTER — Other Ambulatory Visit: Payer: Self-pay | Admitting: Acute Care

## 2021-08-09 DIAGNOSIS — R9389 Abnormal findings on diagnostic imaging of other specified body structures: Secondary | ICD-10-CM

## 2021-08-09 NOTE — Telephone Encounter (Signed)
Spoke with pt who states we will not be able to come in today or tomorrow but will come into office for lab draw on either Thursday 08/11/21 or 08/12/21. Informed pt that would be ok. Pt stated understanding. Nothing further needed at this time.   Routing to [employer] as Juluis Rainier.

## 2021-08-09 NOTE — Telephone Encounter (Signed)
Please call patient and ask him to come in for a CBC, ANCA and BMET., RF, ANA, Mpo/PR 3 Let him know Dr. Erin Fulling and I spoke this am . I will place the orders . Thanks

## 2021-08-11 ENCOUNTER — Ambulatory Visit: Payer: Medicare Other

## 2021-08-12 ENCOUNTER — Other Ambulatory Visit (INDEPENDENT_AMBULATORY_CARE_PROVIDER_SITE_OTHER): Payer: Medicare Other

## 2021-08-12 ENCOUNTER — Telehealth: Payer: Self-pay | Admitting: Acute Care

## 2021-08-12 DIAGNOSIS — R9389 Abnormal findings on diagnostic imaging of other specified body structures: Secondary | ICD-10-CM

## 2021-08-12 LAB — CBC WITH DIFFERENTIAL/PLATELET
Basophils Absolute: 0 10*3/uL (ref 0.0–0.1)
Basophils Relative: 0.5 % (ref 0.0–3.0)
Eosinophils Absolute: 0.2 10*3/uL (ref 0.0–0.7)
Eosinophils Relative: 3 % (ref 0.0–5.0)
HCT: 40.6 % (ref 39.0–52.0)
Hemoglobin: 13.3 g/dL (ref 13.0–17.0)
Lymphocytes Relative: 9.1 % — ABNORMAL LOW (ref 12.0–46.0)
Lymphs Abs: 0.7 10*3/uL (ref 0.7–4.0)
MCHC: 32.7 g/dL (ref 30.0–36.0)
MCV: 97.7 fl (ref 78.0–100.0)
Monocytes Absolute: 0.7 10*3/uL (ref 0.1–1.0)
Monocytes Relative: 8.8 % (ref 3.0–12.0)
Neutro Abs: 5.8 10*3/uL (ref 1.4–7.7)
Neutrophils Relative %: 78.6 % — ABNORMAL HIGH (ref 43.0–77.0)
Platelets: 208 10*3/uL (ref 150.0–400.0)
RBC: 4.16 Mil/uL — ABNORMAL LOW (ref 4.22–5.81)
RDW: 15 % (ref 11.5–15.5)
WBC: 7.4 10*3/uL (ref 4.0–10.5)

## 2021-08-12 LAB — BASIC METABOLIC PANEL
BUN: 18 mg/dL (ref 6–23)
CO2: 30 mEq/L (ref 19–32)
Calcium: 9.3 mg/dL (ref 8.4–10.5)
Chloride: 100 mEq/L (ref 96–112)
Creatinine, Ser: 1.1 mg/dL (ref 0.40–1.50)
GFR: 62 mL/min (ref >60.00)
Glucose, Bld: 104 mg/dL — ABNORMAL HIGH (ref 70–99)
Potassium: 3.4 mEq/L — ABNORMAL LOW (ref 3.5–5.1)
Sodium: 139 mEq/L (ref 135–145)

## 2021-08-12 NOTE — Telephone Encounter (Signed)
Contacted [name] in the lab and verified that the labs he needs drawn do not require him to fast.  Called and spoke with patient, advised him that he does not need to fast.  He verbalized understanding.  Nothing further needed.

## 2021-08-13 ENCOUNTER — Other Ambulatory Visit: Payer: Self-pay | Admitting: Internal Medicine

## 2021-08-13 ENCOUNTER — Other Ambulatory Visit: Payer: Self-pay | Admitting: Cardiology

## 2021-08-13 DIAGNOSIS — G4700 Insomnia, unspecified: Secondary | ICD-10-CM

## 2021-08-13 DIAGNOSIS — R609 Edema, unspecified: Secondary | ICD-10-CM

## 2021-08-15 LAB — ANCA SCREEN W REFLEX TITER: ANCA Screen: NEGATIVE

## 2021-08-15 LAB — RHEUMATOID FACTOR: Rheumatoid fact SerPl-aCnc: 60 IU/mL — ABNORMAL HIGH (ref <14)

## 2021-08-15 LAB — ANA: Anti Nuclear Antibody (ANA): NEGATIVE

## 2021-08-15 LAB — MPO/PR-3 (ANCA) ANTIBODIES
Myeloperoxidase Abs: <1 AI
Serine Protease 3: <1 AI

## 2021-08-23 ENCOUNTER — Ambulatory Visit (INDEPENDENT_AMBULATORY_CARE_PROVIDER_SITE_OTHER): Payer: Medicare Other

## 2021-08-23 ENCOUNTER — Other Ambulatory Visit: Payer: Self-pay

## 2021-08-23 DIAGNOSIS — Z7901 Long term (current) use of anticoagulants: Secondary | ICD-10-CM | POA: Diagnosis not present

## 2021-08-23 LAB — POCT INR: INR: 2.5 (ref 2.0–3.0)

## 2021-08-23 NOTE — Patient Instructions (Addendum)
Pre visit review using our clinic review tool, if applicable. No additional management support is needed unless otherwise documented below in the visit note.  Continue 1/2 tablet daily except take 1 tablet on Mondays.  Re-check in 6 weeks.

## 2021-08-23 NOTE — Progress Notes (Signed)
Continue 1/2 tablet daily except take 1 tablet on Mondays.  Re-check in 6 weeks.

## 2021-08-23 NOTE — Progress Notes (Signed)
Patient ID: [name], male   DOB: 1936-01-29, 85 y.o.   MRN: [id number]  Medical screening examination/treatment/procedure(s) were performed by non-physician practitioner and as supervising physician I was immediately available for consultation/collaboration.  I agree with above. Cathlean Cower, MD

## 2021-09-08 ENCOUNTER — Ambulatory Visit: Payer: Medicare Other

## 2021-09-13 DIAGNOSIS — R350 Frequency of micturition: Secondary | ICD-10-CM | POA: Diagnosis not present

## 2021-09-19 ENCOUNTER — Other Ambulatory Visit: Payer: Self-pay | Admitting: Internal Medicine

## 2021-09-19 DIAGNOSIS — Z7901 Long term (current) use of anticoagulants: Secondary | ICD-10-CM

## 2021-09-19 DIAGNOSIS — Z7952 Long term (current) use of systemic steroids: Secondary | ICD-10-CM

## 2021-09-21 ENCOUNTER — Telehealth: Payer: Self-pay

## 2021-09-21 NOTE — Telephone Encounter (Signed)
Patient called to get refill on predniSONE (DELTASONE) 20 MG tablet  traMADol (ULTRAM) 50 MG tablet  Please send to   [place], [place] Phone:  [phone]  Fax:  [phone]         Please advise

## 2021-09-21 NOTE — Telephone Encounter (Signed)
Last OV 10/21/20 Nex OV 10/25/21

## 2021-09-22 NOTE — Telephone Encounter (Signed)
Refilled per PCP.

## 2021-09-26 ENCOUNTER — Telehealth: Payer: Self-pay | Admitting: Pulmonary Disease

## 2021-09-28 ENCOUNTER — Ambulatory Visit: Payer: Medicare Other | Admitting: Pulmonary Disease

## 2021-09-28 ENCOUNTER — Other Ambulatory Visit: Payer: Self-pay | Admitting: Internal Medicine

## 2021-09-28 ENCOUNTER — Other Ambulatory Visit: Payer: Self-pay

## 2021-09-28 ENCOUNTER — Encounter: Payer: Self-pay | Admitting: Pulmonary Disease

## 2021-09-28 ENCOUNTER — Telehealth: Payer: Self-pay

## 2021-09-28 VITALS — BP 128/82 | HR 67 | Ht 66.0 in | Wt 166.4 lb

## 2021-09-28 DIAGNOSIS — J181 Lobar pneumonia, unspecified organism: Secondary | ICD-10-CM

## 2021-09-28 MED ORDER — STIOLTO RESPIMAT 2.5-2.5 MCG/ACT IN AERS: 2.0000 | INHALATION_SPRAY | Freq: Every day | RESPIRATORY_TRACT | 0 refills | Status: DC

## 2021-09-28 MED ORDER — LEVOFLOXACIN 750 MG PO TABS: 750.0000 mg | ORAL_TABLET | Freq: Every day | ORAL | 0 refills | Status: DC

## 2021-09-28 NOTE — Progress Notes (Signed)
Synopsis: Referred in 08/2020 for hemoptysis   Subjective:   PATIENT ID: [name]: male DOB: [birth year], MRN: [id number]  HPI  Chief Complaint  Patient presents with   Follow-up    F/U on SOB and hemoptysis. States the SOB has increased over the past few weeks, especially in the mornings.    [name] is an 86 year old male, former smoker with aortic valve replacement and atrial fibrillation on coumadin who returns to pulmonary clinic for hemoptysis and shortness of breath.   He was treated for pneumonia in December after visit with Eric Form, NP with augmentin. He does not report much improvement after the antibiotic. He is experiencing increasing shortness    He continues to cough up blood on a daily basis despite lowering his INR goal. He has lost 6lbs since we met 1 year ago.  Past Medical History:  Diagnosis Date   Aortic stenosis    Atrial fibrillation (HCC)    Baker cyst    Left knee   DJD (degenerative joint disease)    knees   Dysrhythmia    Eczema    Erectile dysfunction    Gout    History of hiatal hernia    History of radiation therapy    Right lung- 04/21/21-05/03/21 Dr. Gery Pray   HTN (hypertension)    MVP (mitral valve prolapse)    Shortness of breath dyspnea    with activity     Family History  Problem Relation Age of Onset   Diabetes Other    Stroke Other      Social History   Socioeconomic History   Marital status: Divorced    Spouse name: Not on file   Number of children: Not on file   Years of education: Not on file   Highest education level: Not on file  Occupational History   Not on file  Tobacco Use   Smoking status: Former    Years: 30.00    Types: Cigarettes    Quit date: 08/28/2002    Years since quitting: 19.0   Smokeless tobacco: Never  Vaping Use   Vaping Use: Never used  Substance and Sexual Activity   Alcohol use: Yes    Alcohol/week: 3.0 standard drinks    Types: 3 Shots of liquor per week     Comment: social   Drug use: No   Sexual activity: Not on file  Other Topics Concern   Not on file  Social History Narrative   Not on file   Social Determinants of Health   Financial Resource Strain: Not on file  Food Insecurity: Not on file  Transportation Needs: Not on file  Physical Activity: Not on file  Stress: Not on file  Social Connections: Not on file  Intimate Partner Violence: Not on file     No Known Allergies   Outpatient Medications Prior to Visit  Medication Sig Dispense Refill   allopurinol (ZYLOPRIM) 300 MG tablet Take 1 tablet by mouth once daily 90 tablet 0   amLODipine (NORVASC) 5 MG tablet Take 1 tablet by mouth once daily 90 tablet 0   furosemide (LASIX) 40 MG tablet Take 1 tablet by mouth once daily 90 tablet 1   Ibuprofen 200 MG CAPS Take 400 mg by mouth every 6 (six) hours as needed for fever.     potassium chloride SA (KLOR-CON) 20 MEQ tablet Take 1 tablet (20 mEq total) by mouth daily. 90 tablet 3   predniSONE (DELTASONE)  20 MG tablet TAKE 1/2 (ONE-HALF) TABLET BY MOUTH ONCE DAILY WITH BREAKFAST 45 tablet 0   tamsulosin (FLOMAX) 0.4 MG CAPS capsule TAKE 1 CAPSULE BY MOUTH ONCE DAILY AFTER BREAKFAST 90 capsule 1   traMADol (ULTRAM) 50 MG tablet TAKE 1 TABLET BY MOUTH TWICE DAILY FOR  CHRONIC  PAIN 60 tablet 0   traZODone (DESYREL) 50 MG tablet TAKE 1 TABLET BY MOUTH AT BEDTIME AS NEEDED 90 tablet 0   triamcinolone cream (KENALOG) 0.1 % Apply 1 application topically 2 (two) times daily as needed (itching). Around the nose     VITAMIN D PO Take 1 tablet by mouth daily.     warfarin (COUMADIN) 5 MG tablet Take 1 tablet (5 mg total) by mouth one time only at 4 PM for 1 dose. 5 mg every Mon; 2.5 mg all other days. Take as directed by anticoagulation clinic 75 tablet 0   amoxicillin-clavulanate (AUGMENTIN) 875-125 MG tablet Take 1 tablet by mouth 2 (two) times daily. 28 tablet 0   No facility-administered medications prior to visit.   Review of Systems   Constitutional:  Negative for chills, fever, malaise/fatigue and weight loss.  HENT:  Negative for nosebleeds.   Eyes: Negative.   Respiratory:  Positive for cough, hemoptysis and shortness of breath. Negative for sputum production and wheezing.   Cardiovascular:  Negative for chest pain, palpitations, orthopnea, claudication, leg swelling and PND.  Gastrointestinal:  Negative for abdominal pain, blood in stool, heartburn, nausea and vomiting.  Genitourinary:  Negative for hematuria.  Musculoskeletal:  Positive for joint pain.  Skin:  Negative for rash.  Neurological:  Negative for dizziness, weakness and headaches.  Endo/Heme/Allergies:  Does not bruise/bleed easily.  Psychiatric/Behavioral: Negative.     Objective:   Vitals:   09/28/21 1157  BP: 128/82  Pulse: 67  SpO2: 96%  Weight: 166 lb 6.4 oz (75.5 kg)  Height: 5' 6" (1.676 m)   Physical Exam Constitutional:      Appearance: Normal appearance. He is normal weight.  HENT:     Head: Normocephalic and atraumatic.     Nose: Nose normal. No congestion or rhinorrhea.     Mouth/Throat:     Mouth: Mucous membranes are moist.     Pharynx: Oropharynx is clear. No oropharyngeal exudate or posterior oropharyngeal erythema.  Eyes:     General: No scleral icterus.    Conjunctiva/sclera: Conjunctivae normal.     Pupils: Pupils are equal, round, and reactive to light.  Cardiovascular:     Rate and Rhythm: Normal rate. Rhythm irregular.     Pulses: Normal pulses.     Heart sounds: Normal heart sounds.  Pulmonary:     Effort: Pulmonary effort is normal.     Breath sounds: Rales (left basilar) present. No wheezing or rhonchi.  Musculoskeletal:     Right lower leg: No edema.     Left lower leg: No edema.  Skin:    General: Skin is warm and dry.  Neurological:     General: No focal deficit present.     Mental Status: He is alert.  Psychiatric:        Mood and Affect: Mood normal.        Behavior: Behavior normal.         Thought Content: Thought content normal.        Judgment: Judgment normal.   CBC    Component Value Date/Time   WBC 7.4 08/12/2021 1048   RBC 4.16 (L) 08/12/2021 1048  HGB 13.3 08/12/2021 1048   HGB 12.2 (L) 06/15/2021 1104   HCT 40.6 08/12/2021 1048   HCT 34.6 (L) 06/15/2021 1104   PLT 208.0 08/12/2021 1048   PLT 176 06/15/2021 1104   MCV 97.7 08/12/2021 1048   MCV 92 06/15/2021 1104   MCH 32.3 06/15/2021 1104   MCH 33.1 03/07/2021 0649   MCHC 32.7 08/12/2021 1048   RDW 15.0 08/12/2021 1048   RDW 12.4 06/15/2021 1104   LYMPHSABS 0.7 08/12/2021 1048   LYMPHSABS 1.1 07/09/2017 1229   MONOABS 0.7 08/12/2021 1048   EOSABS 0.2 08/12/2021 1048   EOSABS 0.0 07/09/2017 1229   BASOSABS 0.0 08/12/2021 1048   BASOSABS 0.0 07/09/2017 1229   BMP Latest Ref Rng & Units 08/12/2021 06/15/2021 03/07/2021  Glucose 70 - 99 mg/dL 104(H) 105(H) 86  BUN 6 - 23 mg/dL _0 Creatinine 0.40 - 1.50 mg/dL 1.10 1.07 0.96  BUN/Creat Ratio 10 - 24 - 18 -  Sodium 135 - 145 mEq/L 139 139 137  Potassium 3.5 - 5.1 mEq/L 3.4(L) 4.2 3.6  Chloride 96 - 112 mEq/L 100 102 104  CO2 19 - 32 mEq/L _1 Calcium 8.4 - 10.5 mg/dL 9.3 9.0 8.6(L)   Chest imaging: CXR 08/08/21 Left greater than right lower lobe pneumonia. Possible small associated pleural effusion(s).  PET Scan 03/31/21 1. Low level FDG uptake in the small sub solid right upper lobe lesions. Recommend correlation with biopsy results. 2. Low level FDG uptake in the right medial lower lobe process. No discrete mass or focal area hypermetabolism. 3. No enlarged or hypermetabolic mediastinal or hilar lymph nodes or evidence of metastatic disease elsewhere. 4. Advanced vascular disease.  CXR 08/06/20 Patchy bibasilar opacities. No pneumothorax or pleural effusion. Stable cardiomegaly and postsurgical appearance of the cardiomediastinal silhouette. Multilevel spondylosis.  CTA Chest 05/2008 Mild emphysematous changes noted in upper  lobes.  PFT: No flowsheet data found.  Echo: 2019 - Left ventricle: The cavity size was normal. Wall thickness was    increased in a pattern of mild LVH. Indeterminant diastolic    function (atrial fibrillation). Systolic function was normal. The    estimated ejection fraction was in the range of 55% to 60%. Wall    motion was normal; there were no regional wall motion    abnormalities.  - Aortic valve: Bioprosthetic aortic valve. The bioprosthetic    aortic valve appears to function normally. There was no    significant regurgitation. Mean gradient (S): 13 mm Hg. Valve    area (VTI): 1.37 cm^2.  - Aorta: Status post aortic root replacement.  - Mitral valve: Mildly to moderately calcified annulus. There was    trivial regurgitation. Valve area by continuity equation (using    LVOT flow): 2.27 cm^2.  - Left atrium: The atrium was moderately to severely dilated.  - Right ventricle: The cavity size was mildly dilated. Systolic    function was mildly reduced.  - Right atrium: The atrium was moderately to severely dilated.  - Tricuspid valve: Peak RV-RA gradient (S): 33 mm Hg.  - Pulmonary arteries: PA peak pressure: 36 mm Hg (S).   Assessment & Plan:   Lobar pneumonia, unspecified organism ([place]) - Plan: levofloxacin (LEVAQUIN) 750 MG tablet  Discussion: [name] is an 86 year old male, former smoker with aortic valve replacement and atrial fibrillation on coumadin who returns to pulmonary clinic for hemoptysis and shortness of breath.   He has new LLL infiltrates on CXR from  last month. We will try him on levaquin for atypical and pseudomonas coverage. If he does not improve with this round of antibiotics then I have recommended we move forward with CT Chest scan and possible bronchoscopy to obtain updated cultures. He may have an opportunistic infection. Patient wishes to hold off on repeat imaging at this time.  The hemoptysis remains a mystery. Possibly from the inflammation  occurring in the RLL while on coumadin as there was dilated vasculature noted on bronch of the RLL. The bronch and PET scan are reassuring that the RLL lesion is less likely malignant but still concerning that he has ongoing hemoptysis.   Follow-up in 2 weeks  Freda Jackson, MD [facility] Office: [phone]    Current Outpatient Medications:    allopurinol (ZYLOPRIM) 300 MG tablet, Take 1 tablet by mouth once daily, Disp: 90 tablet, Rfl: 0   amLODipine (NORVASC) 5 MG tablet, Take 1 tablet by mouth once daily, Disp: 90 tablet, Rfl: 0   furosemide (LASIX) 40 MG tablet, Take 1 tablet by mouth once daily, Disp: 90 tablet, Rfl: 1   Ibuprofen 200 MG CAPS, Take 400 mg by mouth every 6 (six) hours as needed for fever., Disp: , Rfl:    levofloxacin (LEVAQUIN) 750 MG tablet, Take 1 tablet (750 mg total) by mouth daily., Disp: 7 tablet, Rfl: 0   potassium chloride SA (KLOR-CON) 20 MEQ tablet, Take 1 tablet (20 mEq total) by mouth daily., Disp: 90 tablet, Rfl: 3   predniSONE (DELTASONE) 20 MG tablet, TAKE 1/2 (ONE-HALF) TABLET BY MOUTH ONCE DAILY WITH BREAKFAST, Disp: 45 tablet, Rfl: 0   tamsulosin (FLOMAX) 0.4 MG CAPS capsule, TAKE 1 CAPSULE BY MOUTH ONCE DAILY AFTER BREAKFAST, Disp: 90 capsule, Rfl: 1   Tiotropium Bromide-Olodaterol (STIOLTO RESPIMAT) 2.5-2.5 MCG/ACT AERS, Inhale 2 puffs into the lungs daily., Disp: 4 g, Rfl: 0   traMADol (ULTRAM) 50 MG tablet, TAKE 1 TABLET BY MOUTH TWICE DAILY FOR  CHRONIC  PAIN, Disp: 60 tablet, Rfl: 0   traZODone (DESYREL) 50 MG tablet, TAKE 1 TABLET BY MOUTH AT BEDTIME AS NEEDED, Disp: 90 tablet, Rfl: 0   triamcinolone cream (KENALOG) 0.1 %, Apply 1 application topically 2 (two) times daily as needed (itching). Around the nose, Disp: , Rfl:    VITAMIN D PO, Take 1 tablet by mouth daily., Disp: , Rfl:    warfarin (COUMADIN) 5 MG tablet, Take 1 tablet (5 mg total) by mouth one time only at 4 PM for 1 dose. 5 mg every Mon; 2.5 mg all other  days. Take as directed by anticoagulation clinic, Disp: 75 tablet, Rfl: 0

## 2021-09-28 NOTE — Telephone Encounter (Signed)
Pt is calling to speak to Larene Beach, RN over the Coumadin clinic.  Pt states he was told to reach out to Coumadin nurse before starting new medication levofloxacin (LEVAQUIN) 750 MG tablet.  Pt CB [phone]

## 2021-09-28 NOTE — Telephone Encounter (Signed)
Pt was prescribed levaquin by pulmonary for pneumonia. Pt will take for 7 days. There is a major interaction with levaquin and warfarin that has the potential to cause bleeding. Adjusting dose to reduce dosage of warfarin. Pt is currently taking 2.5 mg daily except taking 5 mg on Mondays. Pt uses 5 mg tablets and dose not have any other dosage. Not able to split warfarin into quarters due no consistency when split more than once.   New dosing while pt is taking levaquin.  Hold dose on Thursday(2/2), take dose on Fri, hold dose on Sat(2/4), take dose on Sun, only take 1/2 tablet on Mon, take dose on Tues, and hold dose on Wed(2/8). Recheck INR on 2/9. Advised pt if he had any bleeding or abnormal bruising to contact the office. Pt has had hemoptysis for over a year and pulmonologist is aware. Advised if this worsened to contact office. Pt verbalized understanding.

## 2021-09-28 NOTE — Patient Instructions (Addendum)
Take levaquin 1 tablet daily for 7 days  Call the coumadin clinic for recommendations on reducing your coumadin dosing over the next week while taking levaquin antibiotic.   Try stiolto inhaler 2 puffs daily over the next month and monitor for improvement of your shortness of breath  Follow up in 2 weeks to check in and see how

## 2021-09-28 NOTE — Progress Notes (Signed)
Patient seen in the office today and instructed on use of Stiolto.  Patient expressed understanding and demonstrated technique. 

## 2021-09-29 ENCOUNTER — Telehealth: Payer: Self-pay | Admitting: Cardiology

## 2021-09-29 ENCOUNTER — Telehealth: Payer: Self-pay | Admitting: Pulmonary Disease

## 2021-09-29 NOTE — Telephone Encounter (Signed)
Called to inform patient that I would inform [name] that a CT scan was scheduled so that he is aware. Nothing further

## 2021-09-29 NOTE — Telephone Encounter (Signed)
Spoke with pt, he reports he continues to cough up blood everyday. It can be quarter, nickel, or dime size amounts. He has been to pulmonary and they have done a lot of testing and still do not have a reason. He is wanting to know if dr Stanford Breed has any recommendations. Patient aware that if we were to hold the coumadin, he is at a bigger risk for stroke due to the atrial fib. Patient voiced understanding and is aware of the increased risk. Will forward for dr Stanford Breed review

## 2021-09-29 NOTE — Telephone Encounter (Signed)
Patient states for the past year he has been spitting up blood almost every day. He states he has also been seeing a pulmonologist for the past year who put him on 3 different medications with no relief. He would like to know if Dr. Stanford Breed has any recommendations. Please advise.

## 2021-09-30 NOTE — Telephone Encounter (Signed)
Spoke with pt, her reports this morning there was only dime size blood. He is feeling better for the last 2 days. He is currently taking an antibiotic. He will continue with his current medications and let us know if things change.

## 2021-10-06 ENCOUNTER — Ambulatory Visit (INDEPENDENT_AMBULATORY_CARE_PROVIDER_SITE_OTHER): Payer: Medicare Other

## 2021-10-06 ENCOUNTER — Other Ambulatory Visit: Payer: Self-pay

## 2021-10-06 DIAGNOSIS — Z7901 Long term (current) use of anticoagulants: Secondary | ICD-10-CM

## 2021-10-06 LAB — POCT INR: INR: 1.2 — AB (ref 2.0–3.0)

## 2021-10-06 NOTE — Progress Notes (Signed)
Pt was prescribed levaquin which interacts with warfarin. Pt called one week ago and was given instructions for reduced dosing due to abx. Pt reports he is having continued hemoptysis for about a year now and cardiology and pulmonary are aware and have not found the source. Increase dose today to take 1 tablet and increase dose tomorrow to take 1 tablet and then continue 1/2 tablet daily except take 1 tablet on Mondays.  Re-check in 1 weeks.

## 2021-10-06 NOTE — Patient Instructions (Addendum)
Pre visit review using our clinic review tool, if applicable. No additional management support is needed unless otherwise documented below in the visit note.  Increase dose today to take 1 tablet and increase dose tomorrow to take 1 tablet and then continue 1/2 tablet daily except take 1 tablet on Mondays.  Re-check in 1 weeks.

## 2021-10-07 ENCOUNTER — Ambulatory Visit (HOSPITAL_BASED_OUTPATIENT_CLINIC_OR_DEPARTMENT_OTHER)
Admission: RE | Admit: 2021-10-07 | Discharge: 2021-10-07 | Disposition: A | Discharge status: Home / Self Care | Payer: Medicare Other | Source: Ambulatory Visit | Attending: Radiation Oncology | Admitting: Radiation Oncology

## 2021-10-07 DIAGNOSIS — C3411 Malignant neoplasm of upper lobe, right bronchus or lung: Secondary | ICD-10-CM | POA: Diagnosis not present

## 2021-10-07 DIAGNOSIS — I700 Atherosclerosis of aorta: Secondary | ICD-10-CM | POA: Diagnosis not present

## 2021-10-07 DIAGNOSIS — J439 Emphysema, unspecified: Secondary | ICD-10-CM | POA: Diagnosis not present

## 2021-10-12 ENCOUNTER — Ambulatory Visit: Payer: Medicare Other | Admitting: Pulmonary Disease

## 2021-10-12 ENCOUNTER — Encounter: Payer: Self-pay | Admitting: Pulmonary Disease

## 2021-10-12 ENCOUNTER — Other Ambulatory Visit: Payer: Self-pay

## 2021-10-12 VITALS — BP 118/62 | HR 84 | Ht 66.0 in | Wt 167.0 lb

## 2021-10-12 DIAGNOSIS — R042 Hemoptysis: Secondary | ICD-10-CM

## 2021-10-12 DIAGNOSIS — J181 Lobar pneumonia, unspecified organism: Secondary | ICD-10-CM | POA: Diagnosis not present

## 2021-10-12 NOTE — Patient Instructions (Addendum)
I will talk with my colleagues about your imaging and symptoms. If we do not come up with a plan I think it is reasonable to get a second opinion.   We will check sputum cultures.   I will call you later this week with final plan  Follow up in 3 months.

## 2021-10-12 NOTE — Progress Notes (Signed)
Synopsis: Referred in 08/2020 for hemoptysis   Subjective:   PATIENT ID: [name]: male DOB: [birth year], MRN: [id number]  HPI  Chief Complaint  Patient presents with   Follow-up    2 wk f/u for hemoptysis. Recently had a CT scan. States he still has the cough and has seen small amounts of blood in his phlegm.    [name] is an 86 year old male, former smoker with aortic valve replacement and atrial fibrillation on coumadin who returns to pulmonary clinic for hemoptysis and shortness of breath.   He was treated for pneumonia in December after visit with Eric Form, NP with augmentin. He does not report much improvement after the antibiotic. He is experiencing increasing shortness of breath.  He continues to cough up blood on a daily basis despite lowering his INR goal. He has lost 6lbs since we met 1 year ago.  He underwent navigational bronchoscopy 03/07/21 for the RUL lesion and RLL lesion with the RUL lesion positive for non-small cancer and no malignancy noted of the RLL lesion, only inflammation with benign reparative changes noted. He completed SBRT to the RUL nodule 04/2021.   Past Medical History:  Diagnosis Date   Aortic stenosis    Atrial fibrillation (HCC)    Baker cyst    Left knee   DJD (degenerative joint disease)    knees   Dysrhythmia    Eczema    Erectile dysfunction    Gout    History of hiatal hernia    History of radiation therapy    Right lung- 04/21/21-05/03/21 Dr. Gery Pray   HTN (hypertension)    MVP (mitral valve prolapse)    Shortness of breath dyspnea    with activity     Family History  Problem Relation Age of Onset   Diabetes Other    Stroke Other      Social History   Socioeconomic History   Marital status: Divorced    Spouse name: Not on file   Number of children: Not on file   Years of education: Not on file   Highest education level: Not on file  Occupational History   Not on file  Tobacco Use   Smoking  status: Former    Years: 30.00    Types: Cigarettes    Quit date: 08/28/2002    Years since quitting: 19.1   Smokeless tobacco: Never  Vaping Use   Vaping Use: Never used  Substance and Sexual Activity   Alcohol use: Yes    Alcohol/week: 3.0 standard drinks    Types: 3 Shots of liquor per week    Comment: social   Drug use: No   Sexual activity: Not on file  Other Topics Concern   Not on file  Social History Narrative   Not on file   Social Determinants of Health   Financial Resource Strain: Not on file  Food Insecurity: Not on file  Transportation Needs: Not on file  Physical Activity: Not on file  Stress: Not on file  Social Connections: Not on file  Intimate Partner Violence: Not on file     No Known Allergies   Outpatient Medications Prior to Visit  Medication Sig Dispense Refill   allopurinol (ZYLOPRIM) 300 MG tablet Take 1 tablet by mouth once daily 90 tablet 0   amLODipine (NORVASC) 5 MG tablet Take 1 tablet by mouth once daily 90 tablet 0   furosemide (LASIX) 40 MG tablet Take 1 tablet by  mouth once daily 90 tablet 1   Ibuprofen 200 MG CAPS Take 400 mg by mouth every 6 (six) hours as needed for fever.     potassium chloride SA (KLOR-CON) 20 MEQ tablet Take 1 tablet (20 mEq total) by mouth daily. 90 tablet 3   predniSONE (DELTASONE) 20 MG tablet TAKE 1/2 (ONE-HALF) TABLET BY MOUTH ONCE DAILY WITH BREAKFAST 45 tablet 0   tamsulosin (FLOMAX) 0.4 MG CAPS capsule Take 1 capsule (0.4 mg total) by mouth daily after breakfast. 90 capsule 0   traMADol (ULTRAM) 50 MG tablet TAKE 1 TABLET BY MOUTH TWICE DAILY FOR  CHRONIC  PAIN 60 tablet 0   traZODone (DESYREL) 50 MG tablet TAKE 1 TABLET BY MOUTH AT BEDTIME AS NEEDED 90 tablet 0   triamcinolone cream (KENALOG) 0.1 % Apply 1 application topically 2 (two) times daily as needed (itching). Around the nose     VITAMIN D PO Take 1 tablet by mouth daily.     warfarin (COUMADIN) 5 MG tablet Take 1 tablet (5 mg total) by mouth one  time only at 4 PM for 1 dose. 5 mg every Mon; 2.5 mg all other days. Take as directed by anticoagulation clinic 75 tablet 0   levofloxacin (LEVAQUIN) 750 MG tablet Take 1 tablet (750 mg total) by mouth daily. 7 tablet 0   Tiotropium Bromide-Olodaterol (STIOLTO RESPIMAT) 2.5-2.5 MCG/ACT AERS Inhale 2 puffs into the lungs daily. 4 g 0   No facility-administered medications prior to visit.   Review of Systems  Constitutional:  Negative for chills, fever, malaise/fatigue and weight loss.  HENT:  Negative for nosebleeds.   Eyes: Negative.   Respiratory:  Positive for cough, hemoptysis and shortness of breath. Negative for sputum production and wheezing.   Cardiovascular:  Negative for chest pain, palpitations, orthopnea, claudication, leg swelling and PND.  Gastrointestinal:  Negative for abdominal pain, blood in stool, heartburn, nausea and vomiting.  Genitourinary:  Negative for hematuria.  Musculoskeletal:  Positive for joint pain.  Skin:  Negative for rash.  Neurological:  Negative for dizziness, weakness and headaches.  Endo/Heme/Allergies:  Does not bruise/bleed easily.  Psychiatric/Behavioral: Negative.     Objective:   Vitals:   10/12/21 1047  BP: 118/62  Pulse: 84  SpO2: 97%  Weight: 167 lb (75.8 kg)  Height: 5' 6" (1.676 m)   Physical Exam Constitutional:      Appearance: Normal appearance. He is normal weight.  HENT:     Head: Normocephalic and atraumatic.     Nose: Nose normal. No congestion or rhinorrhea.     Mouth/Throat:     Mouth: Mucous membranes are moist.     Pharynx: Oropharynx is clear. No oropharyngeal exudate or posterior oropharyngeal erythema.  Eyes:     General: No scleral icterus.    Conjunctiva/sclera: Conjunctivae normal.     Pupils: Pupils are equal, round, and reactive to light.  Cardiovascular:     Rate and Rhythm: Normal rate. Rhythm irregular.     Pulses: Normal pulses.     Heart sounds: Normal heart sounds.  Pulmonary:     Effort:  Pulmonary effort is normal.     Breath sounds: Rales (left basilar) present. No wheezing or rhonchi.  Musculoskeletal:     Right lower leg: No edema.     Left lower leg: No edema.  Skin:    General: Skin is warm and dry.  Neurological:     General: No focal deficit present.     Mental Status: He  is alert.  Psychiatric:        Mood and Affect: Mood normal.        Behavior: Behavior normal.        Thought Content: Thought content normal.        Judgment: Judgment normal.   CBC    Component Value Date/Time   WBC 7.4 08/12/2021 1048   RBC 4.16 (L) 08/12/2021 1048   HGB 13.3 08/12/2021 1048   HGB 12.2 (L) 06/15/2021 1104   HCT 40.6 08/12/2021 1048   HCT 34.6 (L) 06/15/2021 1104   PLT 208.0 08/12/2021 1048   PLT 176 06/15/2021 1104   MCV 97.7 08/12/2021 1048   MCV 92 06/15/2021 1104   MCH 32.3 06/15/2021 1104   MCH 33.1 03/07/2021 0649   MCHC 32.7 08/12/2021 1048   RDW 15.0 08/12/2021 1048   RDW 12.4 06/15/2021 1104   LYMPHSABS 0.7 08/12/2021 1048   LYMPHSABS 1.1 07/09/2017 1229   MONOABS 0.7 08/12/2021 1048   EOSABS 0.2 08/12/2021 1048   EOSABS 0.0 07/09/2017 1229   BASOSABS 0.0 08/12/2021 1048   BASOSABS 0.0 07/09/2017 1229   BMP Latest Ref Rng & Units 08/12/2021 06/15/2021 03/07/2021  Glucose 70 - 99 mg/dL 104(H) 105(H) 86  BUN 6 - 23 mg/dL _0 Creatinine 0.40 - 1.50 mg/dL 1.10 1.07 0.96  BUN/Creat Ratio 10 - 24 - 18 -  Sodium 135 - 145 mEq/L 139 139 137  Potassium 3.5 - 5.1 mEq/L 3.4(L) 4.2 3.6  Chloride 96 - 112 mEq/L 100 102 104  CO2 19 - 32 mEq/L _1 Calcium 8.4 - 10.5 mg/dL 9.3 9.0 8.6(L)   Chest imaging: CT Chest wo contrast 10/07/21 1. Evolving postradiation fibrosis at the site of the treated right upper lobe neoplasm. 2. Worsening patchy ground-glass attenuation and septal thickening throughout the lungs bilaterally. This is nonspecific, but has been chronic and intermittent over the past year, but most severe on today's examination. It is  unlikely that these regions are within the radiation treatment window. Clinical correlation for signs of worsening chronic atypical infection is suggested. 3. Persistent mass-like area in the medial aspect of the right lower lobe. This demonstrated low-level hypermetabolism on prior PET-CT, which is nonspecific. The possibility of slow-growing neoplasm such as adenocarcinoma could be considered, and in the appropriate clinical setting could account for the other parenchymal changes in the lungs if it was associated with aerogenous spread of disease. Tissue sampling of this area should be considered if not already obtained. 4. Aortic atherosclerosis, in addition to left main and three-vessel coronary artery disease. 5. Diffuse bronchial wall thickening with mild centrilobular and paraseptal emphysema; imaging findings suggestive of underlying COPD.  CXR 08/08/21 Left greater than right lower lobe pneumonia. Possible small associated pleural effusion(s).  PET Scan 03/31/21 1. Low level FDG uptake in the small sub solid right upper lobe lesions. Recommend correlation with biopsy results. 2. Low level FDG uptake in the right medial lower lobe process. No discrete mass or focal area hypermetabolism. 3. No enlarged or hypermetabolic mediastinal or hilar lymph nodes or evidence of metastatic disease elsewhere. 4. Advanced vascular disease.  CXR 08/06/20 Patchy bibasilar opacities. No pneumothorax or pleural effusion. Stable cardiomegaly and postsurgical appearance of the cardiomediastinal silhouette. Multilevel spondylosis.  CTA Chest 05/2008 Mild emphysematous changes noted in upper lobes.  PFT: No flowsheet data found.  Echo: 2019 - Left ventricle: The cavity size was normal. Wall thickness was    increased in a pattern  of mild LVH. Indeterminant diastolic    function (atrial fibrillation). Systolic function was normal. The    estimated ejection fraction was in the range of 55%  to 60%. Wall    motion was normal; there were no regional wall motion    abnormalities.  - Aortic valve: Bioprosthetic aortic valve. The bioprosthetic    aortic valve appears to function normally. There was no    significant regurgitation. Mean gradient (S): 13 mm Hg. Valve    area (VTI): 1.37 cm^2.  - Aorta: Status post aortic root replacement.  - Mitral valve: Mildly to moderately calcified annulus. There was    trivial regurgitation. Valve area by continuity equation (using    LVOT flow): 2.27 cm^2.  - Left atrium: The atrium was moderately to severely dilated.  - Right ventricle: The cavity size was mildly dilated. Systolic    function was mildly reduced.  - Right atrium: The atrium was moderately to severely dilated.  - Tricuspid valve: Peak RV-RA gradient (S): 33 mm Hg.  - Pulmonary arteries: PA peak pressure: 36 mm Hg (S).   Assessment & Plan:   No diagnosis found.  Discussion: [name] is an 86 year old male, former smoker with aortic valve replacement and atrial fibrillation on coumadin who returns to pulmonary clinic for NSCLC s/p radiation 9/22, hemoptysis and shortness of breath.   He has progressive ground glass attenuation and septal thickening of the inferior left upper lobe and throughout the left lower lobe. The RLL mass lesion remains stable. There are post-radiation changes of the RUL.   The etiology of his on-going hemoptysis are not well known. Differential includes indolent infection vs inflammatory component vs malignancy. He had ENT evaluation with no signs of upper airway involvement. The bronch and PET scan are reassuring that the RLL lesion is less likely malignant.   We will check sputum cultures today to evaluate for possible infectious etiology.  I will discuss his case with my colleagues about other potential workup options.  Follow-up in 3 month.s  Freda Jackson, MD [facility] Office: [phone]    Current  Outpatient Medications:    allopurinol (ZYLOPRIM) 300 MG tablet, Take 1 tablet by mouth once daily, Disp: 90 tablet, Rfl: 0   amLODipine (NORVASC) 5 MG tablet, Take 1 tablet by mouth once daily, Disp: 90 tablet, Rfl: 0   furosemide (LASIX) 40 MG tablet, Take 1 tablet by mouth once daily, Disp: 90 tablet, Rfl: 1   Ibuprofen 200 MG CAPS, Take 400 mg by mouth every 6 (six) hours as needed for fever., Disp: , Rfl:    potassium chloride SA (KLOR-CON) 20 MEQ tablet, Take 1 tablet (20 mEq total) by mouth daily., Disp: 90 tablet, Rfl: 3   predniSONE (DELTASONE) 20 MG tablet, TAKE 1/2 (ONE-HALF) TABLET BY MOUTH ONCE DAILY WITH BREAKFAST, Disp: 45 tablet, Rfl: 0   tamsulosin (FLOMAX) 0.4 MG CAPS capsule, Take 1 capsule (0.4 mg total) by mouth daily after breakfast., Disp: 90 capsule, Rfl: 0   traMADol (ULTRAM) 50 MG tablet, TAKE 1 TABLET BY MOUTH TWICE DAILY FOR  CHRONIC  PAIN, Disp: 60 tablet, Rfl: 0   traZODone (DESYREL) 50 MG tablet, TAKE 1 TABLET BY MOUTH AT BEDTIME AS NEEDED, Disp: 90 tablet, Rfl: 0   triamcinolone cream (KENALOG) 0.1 %, Apply 1 application topically 2 (two) times daily as needed (itching). Around the nose, Disp: , Rfl:    VITAMIN D PO, Take 1 tablet by mouth daily., Disp: ,  Rfl:    warfarin (COUMADIN) 5 MG tablet, Take 1 tablet (5 mg total) by mouth one time only at 4 PM for 1 dose. 5 mg every Mon; 2.5 mg all other days. Take as directed by anticoagulation clinic, Disp: 75 tablet, Rfl: 0

## 2021-10-13 ENCOUNTER — Ambulatory Visit (INDEPENDENT_AMBULATORY_CARE_PROVIDER_SITE_OTHER): Payer: Medicare Other

## 2021-10-13 DIAGNOSIS — Z7901 Long term (current) use of anticoagulants: Secondary | ICD-10-CM

## 2021-10-13 LAB — POCT INR: INR: 1.9 — AB (ref 2.0–3.0)

## 2021-10-13 NOTE — Progress Notes (Signed)
Increase dose today to take 1 tablet and then continue 1/2 tablet daily except take 1 tablet on Mondays.  Re-check in 4 weeks.

## 2021-10-13 NOTE — Patient Instructions (Addendum)
Pre visit review using our clinic review tool, if applicable. No additional management support is needed unless otherwise documented below in the visit note.  Increase dose today to take 1 tablet and then continue 1/2 tablet daily except take 1 tablet on Mondays.  Re-check in 4 weeks.

## 2021-10-13 NOTE — Progress Notes (Signed)
Patient ID: [name], male   DOB: [birth year], 86 y.o.   MRN: [id number]  Medical screening examination/treatment/procedure(s) were performed by non-physician practitioner and as supervising physician I was immediately available for consultation/collaboration.  I agree with above. Cathlean Cower, MD

## 2021-10-14 NOTE — Progress Notes (Signed)
error 

## 2021-10-14 NOTE — Telephone Encounter (Signed)
Looks like encounter was open in error so closing encounter. 

## 2021-10-16 NOTE — Progress Notes (Incomplete)
Radiation Oncology         [phone] ________________________________  Name: [name] MRN: [id number]  Date: 10/17/2021  DOB: [birth year]  Follow-Up Visit Note  CC: Isaac Bliss, Rayford Halsted, MD  Collene Gobble, MD  No diagnosis found.  Diagnosis:  The primary encounter diagnosis was Malignant neoplasm of upper lobe of right lung ([place]). A diagnosis of Pulmonary nodules was also pertinent to this visit.  Clinical stage I non-small cell lung cancer  Interval Since Last Radiation: 5 months and 14 days   Intent: Curative  Radiation Treatment Dates: 04/21/2021 through 05/03/2021 Site Technique Total Dose (Gy) Dose per Fx (Gy) Completed Fx Beam Energies  Lung, Right: Lung_Rt SBRT 50/50 10 5/5 6X    Narrative:  The patient returns today for routine follow-up and to review recent imaging, he was last seen here for follow up on 06/09/21. Since his last visit, the patient presented to his pulmonologist, Dr. Erin Fulling, on 07/29/21 with ongoing complaints of daily hemoptysis.  Per encounter notes, a discussion was held with his cardiology team and the plan was to decrease his goal INR range to 2-2.5 and monitor for any improvement in the hemoptysis. The patient did report improvement in his hemoptysis after most recent prednisone taper, and remains on prednisone 10mg  daily. Dr. Erin Fulling also discussed the possibility of pursuing CT-guided lung biopsy via interventional radiology in order to get a better idea of the right lower lobe lesion, though the patient declined pursuing this.  Soon after, the patient again returned to the pulmonary clinic on 08/08/21 with complaints of shortness of breath x 1 week and chest congestion. He also stated that he continued to cough up around 1 Tablespoon a day, and reported that the blood changed to a lighter red color. He denied any fever or chest pain, and was noted to still be taking Lasix 40 mg daily and 10 mg prednisone daily. In regards to his chest  congestion, the patient reported cough productive of thick yellow-white secretions with some blood as noted above. Subsequent CXR performed on this same date showed left greater than right lower lobe pneumonia, and possible small associated pleural effusion(s).  Per Dr. Erin Fulling, the patient was treated for pneumonia with Augmentin in December after his visit with the pulmonary clinic.  The patient again followed up with Dr. Erin Fulling on 09/28/21. During which time, the patient again endorsed ongoing daily hemoptysis and SOB despite lowering his INR goal. The patient also reported not much improvement with Augmentin. Given findings of new LLL infiltrate from CXR on 08/08/21, Dr. Erin Fulling recommended levaquin for atypical and pseudomonas coverage. If the patient does not improve with this round of antibiotics then Dr. Kelli Churn recommends chest CT and possible bronchoscopy to rule out an opportunistic infection. In regards to the cause of the patient's hemoptysis, Dr. Erin Fulling noted this to remain a mystery. Possible differentials noted by Dr. Erin Fulling included inflammation occurring in the RLL while on coumadin, given that dilated vasculature was noted on bronchoscopy findings of the RLL. Per his most recent follow up visit with Dr. Erin Fulling on 10/12/21, the patient still reports ongoing hemoptysis and SOB.   Recent chest CT on 10/07/21 demonstrated: evolving postradiation fibrosis at the site of the treated right upper lobe neoplasm; worsening patchy ground-glass attenuation and septal thickening throughout the lungs bilaterally; and a persistent nonspecific mass-like area in the medial aspect of the right lower lobe, possibly suspicious for slow-growing neoplasm such as adenocarcinoma. Other findings on CT included aortic  atherosclerosis, left main and three-vessel coronary artery disease, and diffuse bronchial wall thickening with mild centrilobular and paraseptal emphysema suggestive of underlying COPD.    Allergies:   has No Known Allergies.  Meds: Current Outpatient Medications  Medication Sig Dispense Refill   allopurinol (ZYLOPRIM) 300 MG tablet Take 1 tablet by mouth once daily 90 tablet 0   amLODipine (NORVASC) 5 MG tablet Take 1 tablet by mouth once daily 90 tablet 0   furosemide (LASIX) 40 MG tablet Take 1 tablet by mouth once daily 90 tablet 1   Ibuprofen 200 MG CAPS Take 400 mg by mouth every 6 (six) hours as needed for fever.     potassium chloride SA (KLOR-CON) 20 MEQ tablet Take 1 tablet (20 mEq total) by mouth daily. 90 tablet 3   predniSONE (DELTASONE) 20 MG tablet TAKE 1/2 (ONE-HALF) TABLET BY MOUTH ONCE DAILY WITH BREAKFAST 45 tablet 0   tamsulosin (FLOMAX) 0.4 MG CAPS capsule Take 1 capsule (0.4 mg total) by mouth daily after breakfast. 90 capsule 0   traMADol (ULTRAM) 50 MG tablet TAKE 1 TABLET BY MOUTH TWICE DAILY FOR  CHRONIC  PAIN 60 tablet 0   traZODone (DESYREL) 50 MG tablet TAKE 1 TABLET BY MOUTH AT BEDTIME AS NEEDED 90 tablet 0   triamcinolone cream (KENALOG) 0.1 % Apply 1 application topically 2 (two) times daily as needed (itching). Around the nose     VITAMIN D PO Take 1 tablet by mouth daily.     warfarin (COUMADIN) 5 MG tablet Take 1 tablet (5 mg total) by mouth one time only at 4 PM for 1 dose. 5 mg every Mon; 2.5 mg all other days. Take as directed by anticoagulation clinic 75 tablet 0   No current facility-administered medications for this encounter.    Physical Findings: The patient is in no acute distress. Patient is alert and oriented.  vitals were not taken for this visit. .  No significant changes. Lungs are clear to auscultation bilaterally. Heart has regular rate and rhythm. No palpable cervical, supraclavicular, or axillary adenopathy. Abdomen soft, non-tender, normal bowel sounds.   Lab Findings: Lab Results  Component Value Date   WBC 7.4 08/12/2021   HGB 13.3 08/12/2021   HCT 40.6 08/12/2021   MCV 97.7 08/12/2021   PLT 208.0 08/12/2021     Radiographic Findings: CT CHEST WO CONTRAST  Result Date: 10/08/2021 CLINICAL DATA:  86 year old male with history of non-small cell lung cancer. EXAM: CT CHEST WITHOUT CONTRAST TECHNIQUE: Multidetector CT imaging of the chest was performed following the standard protocol without IV contrast. RADIATION DOSE REDUCTION: This exam was performed according to the departmental dose-optimization program which includes automated exposure control, adjustment of the mA and/or kV according to patient size and/or use of iterative reconstruction technique. COMPARISON:  PET-CT 03/31/2021.  Chest CT 02/21/2021. FINDINGS: Cardiovascular: Heart size is normal. There is no significant pericardial fluid, thickening or pericardial calcification. There is aortic atherosclerosis, as well as atherosclerosis of the great vessels of the mediastinum and the coronary arteries, including calcified atherosclerotic plaque in the left main, left anterior descending, left circumflex and right coronary arteries. Status post median sternotomy for aortic root replacement with a stented bioprosthesis which is densely calcified. Mediastinum/Nodes: No pathologically enlarged mediastinal or hilar lymph nodes. Please note that accurate exclusion of hilar adenopathy is limited on noncontrast CT scans. Esophagus is unremarkable in appearance. No axillary lymphadenopathy. Lungs/Pleura: New area of septal thickening and regional architectural distortion in the right upper lobe near  the apex in an area with fiducial markers at the site of prior treated neoplasm, most compatible with evolving postradiation fibrosis. In addition, there is extensive ground-glass attenuation, septal thickening and thickening of the peribronchovascular interstitium scattered elsewhere throughout the lungs bilaterally, most evident in the posterior left upper lobe and in the lower lobes of the lungs bilaterally. Notably, this pattern has been present in a similar fashion on  several prior CT examinations dating back to September 16, 2020, although today's study demonstrates more severe findings. Again noted in the medial aspect of the right lower lobe is a mass-like area of architectural distortion which currently measures 6.6 x 2.1 x 3.0 cm (sagittal image 64 of series 6 and coronal image 50 of series 5), similar to prior examinations. No pleural effusions. Diffuse bronchial wall thickening with mild centrilobular and paraseptal emphysema. Upper Abdomen: Aortic atherosclerosis. Musculoskeletal: Median sternotomy wires. There are no aggressive appearing lytic or blastic lesions noted in the visualized portions of the skeleton. IMPRESSION: 1. Evolving postradiation fibrosis at the site of the treated right upper lobe neoplasm. 2. Worsening patchy ground-glass attenuation and septal thickening throughout the lungs bilaterally. This is nonspecific, but has been chronic and intermittent over the past year, but most severe on today's examination. It is unlikely that these regions are within the radiation treatment window. Clinical correlation for signs of worsening chronic atypical infection is suggested. 3. Persistent mass-like area in the medial aspect of the right lower lobe. This demonstrated low-level hypermetabolism on prior PET-CT, which is nonspecific. The possibility of slow-growing neoplasm such as adenocarcinoma could be considered, and in the appropriate clinical setting could account for the other parenchymal changes in the lungs if it was associated with aerogenous spread of disease. Tissue sampling of this area should be considered if not already obtained. 4. Aortic atherosclerosis, in addition to left main and three-vessel coronary artery disease. 5. Diffuse bronchial wall thickening with mild centrilobular and paraseptal emphysema; imaging findings suggestive of underlying COPD. Aortic Atherosclerosis (ICD10-I70.0) and Emphysema (ICD10-J43.9). Electronically Signed   By:  Vinnie Langton M.D.   On: 10/08/2021 06:52    Impression:  The primary encounter diagnosis was Malignant neoplasm of upper lobe of right lung ([place]). A diagnosis of Pulmonary nodules was also pertinent to this visit.  Clinical stage I non-small cell lung cancer   The patient is recovering from the effects of radiation.  ***  Plan:  ***   *** minutes of total time was spent for this patient encounter, including preparation, face-to-face counseling with the patient and coordination of care, physical exam, and documentation of the encounter. ____________________________________  Blair Promise, PhD, MD   This document serves as a record of services personally performed by Gery Pray, MD. It was created on his behalf by Roney Mans, a trained medical scribe. The creation of this record is based on the scribe's personal observations and the provider's statements to them. This document has been checked and approved by the attending provider.

## 2021-10-17 ENCOUNTER — Ambulatory Visit
Admission: RE | Admit: 2021-10-17 | Discharge: 2021-10-17 | Disposition: A | Discharge status: Home / Self Care | Payer: Medicare Other | Source: Ambulatory Visit | Attending: Radiation Oncology | Admitting: Radiation Oncology

## 2021-10-17 ENCOUNTER — Telehealth: Payer: Self-pay | Admitting: *Deleted

## 2021-10-17 NOTE — Telephone Encounter (Signed)
RETURNED PATIENT'S PHONE CALL, SPOKE WITH PATIENT. ?

## 2021-10-20 ENCOUNTER — Other Ambulatory Visit: Payer: Self-pay | Admitting: Internal Medicine

## 2021-10-24 ENCOUNTER — Other Ambulatory Visit: Payer: Medicare Other

## 2021-10-24 DIAGNOSIS — J181 Lobar pneumonia, unspecified organism: Secondary | ICD-10-CM

## 2021-10-25 ENCOUNTER — Ambulatory Visit (INDEPENDENT_AMBULATORY_CARE_PROVIDER_SITE_OTHER): Payer: Medicare Other | Admitting: Internal Medicine

## 2021-10-25 ENCOUNTER — Encounter: Payer: Self-pay | Admitting: Internal Medicine

## 2021-10-25 VITALS — BP 120/70 | HR 82 | Temp 97.1°F | Ht 65.5 in | Wt 165.5 lb

## 2021-10-25 DIAGNOSIS — E538 Deficiency of other specified B group vitamins: Secondary | ICD-10-CM

## 2021-10-25 DIAGNOSIS — Z7901 Long term (current) use of anticoagulants: Secondary | ICD-10-CM

## 2021-10-25 DIAGNOSIS — Z7952 Long term (current) use of systemic steroids: Secondary | ICD-10-CM | POA: Diagnosis not present

## 2021-10-25 DIAGNOSIS — R042 Hemoptysis: Secondary | ICD-10-CM

## 2021-10-25 DIAGNOSIS — I10 Essential (primary) hypertension: Secondary | ICD-10-CM

## 2021-10-25 DIAGNOSIS — Z0000 Encounter for general adult medical examination without abnormal findings: Secondary | ICD-10-CM | POA: Diagnosis not present

## 2021-10-25 DIAGNOSIS — I4891 Unspecified atrial fibrillation: Secondary | ICD-10-CM

## 2021-10-25 DIAGNOSIS — I700 Atherosclerosis of aorta: Secondary | ICD-10-CM

## 2021-10-25 DIAGNOSIS — R918 Other nonspecific abnormal finding of lung field: Secondary | ICD-10-CM | POA: Diagnosis not present

## 2021-10-25 DIAGNOSIS — Z952 Presence of prosthetic heart valve: Secondary | ICD-10-CM | POA: Diagnosis not present

## 2021-10-25 DIAGNOSIS — E559 Vitamin D deficiency, unspecified: Secondary | ICD-10-CM | POA: Diagnosis not present

## 2021-10-25 LAB — LIPID PANEL
Cholesterol: 197 mg/dL (ref 0–200)
HDL: 56.2 mg/dL (ref >39.00)
LDL Cholesterol: 121 mg/dL — ABNORMAL HIGH (ref 0–99)
NonHDL: 140.9
Total CHOL/HDL Ratio: 4
Triglycerides: 99 mg/dL (ref 0.0–149.0)
VLDL: 19.8 mg/dL (ref 0.0–40.0)

## 2021-10-25 LAB — CBC WITH DIFFERENTIAL/PLATELET
Basophils Absolute: 0 10*3/uL (ref 0.0–0.1)
Basophils Relative: 0.5 % (ref 0.0–3.0)
Eosinophils Absolute: 0 10*3/uL (ref 0.0–0.7)
Eosinophils Relative: 0.7 % (ref 0.0–5.0)
HCT: 39.8 % (ref 39.0–52.0)
Hemoglobin: 13.1 g/dL (ref 13.0–17.0)
Lymphocytes Relative: 6 % — ABNORMAL LOW (ref 12.0–46.0)
Lymphs Abs: 0.4 10*3/uL — ABNORMAL LOW (ref 0.7–4.0)
MCHC: 33 g/dL (ref 30.0–36.0)
MCV: 97 fl (ref 78.0–100.0)
Monocytes Absolute: 0.4 10*3/uL (ref 0.1–1.0)
Monocytes Relative: 5.7 % (ref 3.0–12.0)
Neutro Abs: 6.5 10*3/uL (ref 1.4–7.7)
Neutrophils Relative %: 87.1 % — ABNORMAL HIGH (ref 43.0–77.0)
Platelets: 194 10*3/uL (ref 150.0–400.0)
RBC: 4.1 Mil/uL — ABNORMAL LOW (ref 4.22–5.81)
RDW: 15.1 % (ref 11.5–15.5)
WBC: 7.4 10*3/uL (ref 4.0–10.5)

## 2021-10-25 LAB — COMPREHENSIVE METABOLIC PANEL
ALT: 13 U/L (ref 0–53)
AST: 22 U/L (ref 0–37)
Albumin: 3.9 g/dL (ref 3.5–5.2)
Alkaline Phosphatase: 65 U/L (ref 39–117)
BUN: 21 mg/dL (ref 6–23)
CO2: 32 mEq/L (ref 19–32)
Calcium: 9 mg/dL (ref 8.4–10.5)
Chloride: 100 mEq/L (ref 96–112)
Creatinine, Ser: 0.97 mg/dL (ref 0.40–1.50)
GFR: 72 mL/min (ref >60.00)
Glucose, Bld: 91 mg/dL (ref 70–99)
Potassium: 3.9 mEq/L (ref 3.5–5.1)
Sodium: 140 mEq/L (ref 135–145)
Total Bilirubin: 0.9 mg/dL (ref 0.2–1.2)
Total Protein: 6.7 g/dL (ref 6.0–8.3)

## 2021-10-25 LAB — VITAMIN D 25 HYDROXY (VIT D DEFICIENCY, FRACTURES): VITD: 45.99 ng/mL (ref 30.00–100.00)

## 2021-10-25 LAB — HEMOGLOBIN A1C: Hgb A1c MFr Bld: 5.4 % (ref 4.6–6.5)

## 2021-10-25 LAB — VITAMIN B12: Vitamin B-12: 228 pg/mL (ref 211–911)

## 2021-10-25 NOTE — Progress Notes (Signed)
Established Patient Office Visit     This visit occurred during the SARS-CoV-2 public health emergency.  Safety protocols were in place, including screening questions prior to the visit, additional usage of staff PPE, and extensive cleaning of exam room while observing appropriate contact time as indicated for disinfecting solutions.    CC/Reason for Visit: Annual preventive exam and subsequent Medicare wellness visit  HPI: [name] is a 86 y.o. male who is coming in today for the above mentioned reasons. Past Medical History is significant for:  Hypertension, atrial fibrillation, aortic valve replacement on Coumadin, BPH, chronic low back pain and osteoarthritis of the hands.  He has also been having hemoptysis for well over a year without a concrete etiology.  He continues to follow with pulmonary routinely.  He has had 2 bronchoscopies, 1 resulted with atypical cells for which she completed a session of radiation.  In the last 3 months he has had at least 2 distinct courses of antibiotics.  It appears the plan now is for repeat CT scan and possible repeat bronchoscopy.  He tells me that he has also left a series of sputum's with pulmonary for sputum analysis to consider possibly atypical infectious pathogens.  He is otherwise feeling well.  He continues to live independently and does well at home.  He has routine eye and dental care.  He has not yet had his COVID booster but he had his flu vaccine in September 2022.  He no longer pursues colonoscopies due to age.   Past Medical/Surgical History: Past Medical History:  Diagnosis Date   Aortic stenosis    Atrial fibrillation (HCC)    Baker cyst    Left knee   DJD (degenerative joint disease)    knees   Dysrhythmia    Eczema    Erectile dysfunction    Gout    History of hiatal hernia    History of radiation therapy    Right lung- 04/21/21-05/03/21 Dr. Gery Pray   HTN (hypertension)    MVP (mitral valve prolapse)     Shortness of breath dyspnea    with activity    Past Surgical History:  Procedure Laterality Date    polyps vocal cord  1994   AORTIC VALVE REPLACEMENT  ~2004   BRONCHIAL BIOPSY  03/07/2021   Procedure: BRONCHIAL BIOPSIES;  Surgeon: Collene Gobble, MD;  Location: [facility] ENDOSCOPY;  Service: Pulmonary;;   BRONCHIAL BRUSHINGS  03/07/2021   Procedure: BRONCHIAL BRUSHINGS;  Surgeon: Collene Gobble, MD;  Location[facility] ENDOSCOPY;  Service: Pulmonary;;   BRONCHIAL NEEDLE ASPIRATION BIOPSY  03/07/2021   Procedure: BRONCHIAL NEEDLE ASPIRATION BIOPSIES;  Surgeon: Collene Gobble, MD;  Location: [facility] ENDOSCOPY;  Service: Pulmonary;;   BRONCHIAL WASHINGS  09/21/2020   Procedure: BRONCHIAL WASHINGS;  Surgeon: Freddi Starr, MD;  Location: [facility] ENDOSCOPY;  Service: Pulmonary;;  BAL    BRONCHIAL WASHINGS  03/07/2021   Procedure: BRONCHIAL WASHINGS;  Surgeon: Collene Gobble, MD;  Location: [place];  Service: Pulmonary;;   FIDUCIAL MARKER PLACEMENT  03/07/2021   Procedure: FIDUCIAL MARKER PLACEMENT;  Surgeon: Collene Gobble, MD;  Location: [facility] ENDOSCOPY;  Service: Pulmonary;;   HERNIA REPAIR  ~1978   HERNIA REPAIR  ~1983   LUMBAR LAMINECTOMY/DECOMPRESSION MICRODISCECTOMY N/A 01/15/2015   Procedure: 2 LEVEL DECOMPRESSIVE LUMBAR LAMINECTOMY L3-L4,L4-L5;  Surgeon: Latanya Maudlin, MD;  Location: [facility] ORS;  Service: Orthopedics;  Laterality: N/A;   LUMBAR LAMINECTOMY/DECOMPRESSION MICRODISCECTOMY N/A 05/01/2017   Procedure: Lumbar one-Lumbar  two, Lumbar two-Lumbar three, Lumbar three-Lumbar four Laminectomy; Evacuation of hematoma;  Surgeon: Consuella Lose, MD;  Location: [place];  Service: Neurosurgery;  Laterality: N/A;   right total hip  ~4 years ago   [place] Left ~2009   TOTAL KNEE ARTHROPLASTY Left 04/14/2015   Procedure: LEFT TOTAL KNEE ARTHROPLASTY;  Surgeon: Latanya Maudlin, MD;  Location: [facility] ORS;  Service: Orthopedics;  Laterality: Left;   VIDEO BRONCHOSCOPY N/A 09/21/2020   Procedure: VIDEO  BRONCHOSCOPY WITHOUT FLUORO;  Surgeon: Freddi Starr, MD;  Location: [facility] ENDOSCOPY;  Service: Pulmonary;  Laterality: N/A;   VIDEO BRONCHOSCOPY WITH ENDOBRONCHIAL NAVIGATION Right 03/07/2021   Procedure: VIDEO BRONCHOSCOPY WITH ENDOBRONCHIAL NAVIGATION;  Surgeon: Collene Gobble, MD;  Location[facility] ENDOSCOPY;  Service: Pulmonary;  Laterality: Right;    Social History:  reports that he quit smoking about 19 years ago. His smoking use included cigarettes. He has never used smokeless tobacco. He reports current alcohol use of about 3.0 standard drinks per week. He reports that he does not use drugs.  Allergies: No Known Allergies  Family History:  Family History  Problem Relation Age of Onset   Diabetes Other    Stroke Other      Current Outpatient Medications:    allopurinol (ZYLOPRIM) 300 MG tablet, Take 1 tablet by mouth once daily, Disp: 90 tablet, Rfl: 0   amLODipine (NORVASC) 5 MG tablet, Take 1 tablet by mouth once daily, Disp: 90 tablet, Rfl: 0   furosemide (LASIX) 40 MG tablet, Take 1 tablet by mouth once daily, Disp: 90 tablet, Rfl: 1   Ibuprofen 200 MG CAPS, Take 400 mg by mouth every 6 (six) hours as needed for fever., Disp: , Rfl:    potassium chloride SA (KLOR-CON) 20 MEQ tablet, Take 1 tablet (20 mEq total) by mouth daily., Disp: 90 tablet, Rfl: 3   predniSONE (DELTASONE) 20 MG tablet, TAKE 1/2 (ONE-HALF) TABLET BY MOUTH ONCE DAILY WITH BREAKFAST, Disp: 45 tablet, Rfl: 0   tamsulosin (FLOMAX) 0.4 MG CAPS capsule, Take 1 capsule (0.4 mg total) by mouth daily after breakfast., Disp: 90 capsule, Rfl: 0   traMADol (ULTRAM) 50 MG tablet, TAKE 1 TABLET BY MOUTH TWICE DAILY FOR  CHRONIC  PAIN, Disp: 60 tablet, Rfl: 0   traZODone (DESYREL) 50 MG tablet, TAKE 1 TABLET BY MOUTH AT BEDTIME AS NEEDED, Disp: 90 tablet, Rfl: 0   triamcinolone cream (KENALOG) 0.1 %, Apply 1 application topically 2 (two) times daily as needed (itching). Around the nose, Disp: , Rfl:    VITAMIN D PO, Take  1 tablet by mouth daily., Disp: , Rfl:    warfarin (COUMADIN) 5 MG tablet, Take 1 tablet (5 mg total) by mouth one time only at 4 PM for 1 dose. 5 mg every Mon; 2.5 mg all other days. Take as directed by anticoagulation clinic, Disp: 75 tablet, Rfl: 0  Review of Systems:  Constitutional: Denies fever, chills, diaphoresis. HEENT: Denies photophobia, eye pain, redness, hearing loss, ear pain, congestion, sore throat, rhinorrhea, sneezing, mouth sores,  neck stiffness and tinnitus.   Respiratory: Denies  chest tightness,  and wheezing.   Cardiovascular: Denies chest pain, palpitations and leg swelling.  Gastrointestinal: Denies nausea, vomiting, abdominal pain, diarrhea, constipation, blood in stool and abdominal distention.  Genitourinary: Denies dysuria, urgency, frequency, hematuria, flank pain and difficulty urinating.  Endocrine: Denies: hot or cold intolerance, sweats, changes in hair or nails, polyuria, polydipsia. Musculoskeletal: Denies myalgias, back pain, joint swelling, arthralgias and gait problem.  Skin: Denies pallor, rash and wound.  Neurological: Denies dizziness, seizures, syncope,  light-headedness, numbness and headaches.  Hematological: Denies adenopathy. Easy bruising, personal or family bleeding history  Psychiatric/Behavioral: Denies suicidal ideation, mood changes, confusion, nervousness, sleep disturbance and agitation    Physical Exam: Vitals:   10/25/21 0939  BP: 120/70  Pulse: 82  Temp: (!) 97.1 F (36.2 C)  TempSrc: Oral  SpO2: 97%  Weight: 165 lb 8 oz (75.1 kg)  Height: 5' 5.5" (1.664 m)    Body mass index is 27.12 kg/m.   Constitutional: NAD, calm, comfortable Eyes: PERRL, lids and conjunctivae normal, wears corrective lenses ENMT: Mucous membranes are moist. Posterior pharynx is erythematous but clear of any exudate or lesions.  Tympanic membrane is pearly white, no erythema or bulging. Neck: normal, supple, no masses, no thyromegaly Respiratory:  left basilar Rales are apparent no wheezing, no crackles. Normal respiratory effort. No accessory muscle use.  Cardiovascular: Regular rate and irregular rhythm, No extremity edema.  No carotid bruits.  Abdomen: no tenderness, no masses palpated. No hepatosplenomegaly. Bowel sounds positive.  Musculoskeletal: no clubbing / cyanosis. No joint deformity upper and lower extremities. Good ROM, no contractures. Normal muscle tone.  Skin: no rashes, lesions, ulcers. No induration Neurologic: CN 2-12 grossly intact. Sensation intact, DTR normal. Strength 5/5 in all 4.  Psychiatric: Normal judgment and insight. Alert and oriented x 3. Normal mood.    Subsequent Medicare wellness visit   1. Risk factors, based on past  M,S,F -cardiovascular disease risk factors include age, gender, history of hypertension and aortic atherosclerosis   2.  Physical activities: He walks on a daily basis   3.  Depression/mood: Stable, not depressed   4.  Hearing: No perceived issues   5.  ADL's: Independent in all ADLs   6.  Fall risk: Low fall risk   7.  Home safety: No problems identified   8.  Height weight, and visual acuity: height and weight as above, vision:  Vision Screening   Right eye Left eye Both eyes  Without correction     With correction _0     9.  Counseling: Advised to update COVID booster   10. Lab orders based on risk factors: Laboratory update will be reviewed   11. Referral : None today   12. Care plan: Follow-up with me in 6 to 12 months   13. Cognitive assessment: No cognitive impairment   14. Screening: Patient provided with a written and personalized 5-10 year screening schedule in the AVS. yes   15. Provider List Update: PCP, pulmonary Dr. Erin Fulling  16. Advance Directives: Full code   17. Opioids: Patient is on chronic opioids. He has a signed pain contract with me. Has not displayed any signs of an opioid-use disorder.   [place] Office Visit from  10/25/2021 in [place] at [facility]  PHQ-9 Total Score 0       Fall Risk 10/21/2020 03/07/2021 03/23/2021 06/09/2021 10/25/2021  Falls in the past year? 0 - - - 0  Was there an injury with Fall? 0 - - - 0  Fall Risk Category Calculator 0 - - - 0  Fall Risk Category Low - - - Low  Patient Fall Risk Level - Moderate fall risk Low fall risk Low fall risk -  Fall risk Follow up - - - - Falls evaluation completed     Impression and Plan:  Encounter for preventive health examination -Recommend routine eye and dental care. -Immunizations:  Is due for COVID booster, shingles, other vaccines are up-to-date -Healthy lifestyle discussed in detail. -Labs to be updated today. -Colon cancer screening: No further due to age -Breast cancer screening: Not applicable -Cervical cancer screening: Not applicable -Lung cancer screening: Not applicable -Prostate cancer screening: No further due to age -DEXA: 06/2018, reordered today, high risk for osteoporosis due to chronic steroid use.  Pulmonary nodules Hemoptysis -Ongoing, chronic issue, followed by pulmonary.  Essential hypertension  - Plan: CBC with Differential/Platelet, Comprehensive metabolic panel, Lipid panel -Well-controlled.  S/P AVR (aortic valve replacement) Long term (current) use of anticoagulants Atrial fibrillation, unspecified type ([place]) -Followed by cardiology, anticoagulated on Coumadin.  Vitamin B12 deficiency  - Plan: Vitamin B12  Vitamin D deficiency  - Plan: VITAMIN D 25 Hydroxy (Vit-D Deficiency, Fractures)  Aortic atherosclerosis (HCC) -Noted, not on statin, check lipids today    Patient Instructions  -Nice seeing you today!!  -Lab work today; will notify you once results are available.  -Schedule follow up in 1 year or sooner as needed.    Lelon Frohlich, MD [facility] at [facility]

## 2021-10-25 NOTE — Patient Instructions (Signed)
-  Nice seeing you today!!  -Lab work today; will notify you once results are available.  -Schedule follow up in 1 year or sooner as needed. 

## 2021-10-25 NOTE — Addendum Note (Signed)
Addended by: Westley Hummer B on: 10/25/2021 10:38 AM   Modules accepted: Orders

## 2021-10-26 ENCOUNTER — Encounter: Payer: Self-pay | Admitting: Pulmonary Disease

## 2021-11-02 ENCOUNTER — Other Ambulatory Visit: Payer: Self-pay | Admitting: Emergency Medicine

## 2021-11-02 DIAGNOSIS — R042 Hemoptysis: Secondary | ICD-10-CM

## 2021-11-02 NOTE — Progress Notes (Signed)
We will work on arranging repeat navigational bronchoscopy to evaluate hemoptysis and right lower lobe lesion ?

## 2021-11-03 ENCOUNTER — Encounter: Payer: Self-pay | Admitting: Emergency Medicine

## 2021-11-03 ENCOUNTER — Telehealth: Payer: Self-pay | Admitting: Internal Medicine

## 2021-11-03 ENCOUNTER — Telehealth: Payer: Self-pay

## 2021-11-03 DIAGNOSIS — Z7901 Long term (current) use of anticoagulants: Secondary | ICD-10-CM

## 2021-11-03 NOTE — Telephone Encounter (Signed)
Pt is calling and would like to know the reason he needs bone density test ?

## 2021-11-03 NOTE — Telephone Encounter (Signed)
Pt reports he has a bronchoscopy scheduled for 3/20 and was given instructions below from pulmonary. ? ?Procedure: Bronchoscopy                         ?  ?Procedure date:  11/14/2021 arrive at 5:30am ?  ?[facility] [address] [place], [place], [place] [address] ?  ?Please stop the following medications: Stop Coumadin last dose 11/09/2021 you will need to make your primary care doctor aware. ?Do not eat anything after midnight - ok to take medications on the day of procedure with only sips of water ?You will need someone to drive you home after this procedure ?  ? ? ? ?Pt will need lovenox bridge and apt moved up to check INR. [facility] coumadin clinic apt to 3/14 and advised a dosing schedule with lovenox bridge will be given at that apt. Pt would like lovenox sent ot [facility] when dosing is complete.  ?

## 2021-11-03 NOTE — Telephone Encounter (Signed)
Spoke with patient and explained why he needed a DEXA.   ?Copy of labs mailed to home address per patient request. ?

## 2021-11-04 ENCOUNTER — Telehealth: Payer: Self-pay | Admitting: Internal Medicine

## 2021-11-04 NOTE — Telephone Encounter (Signed)
Patient called in requesting a phone call back from [place] regarding a procedure that was mentioned to him during his last visit.  ? ?Please advise. ?

## 2021-11-04 NOTE — Telephone Encounter (Signed)
Lovenox bridge for bronchoscopy on 11/14/21. ?Wt: 75 kg ?CrCl: 61.21 ?Lovenox, 120 mg, QD  ? ?3/15: Take last dose of coumadin ?3/16: NO coumadin, NO lovenox ?3/17: NO coumadin, Lovenox once in the AM ?3/18: NO coumadin, Lovenox once in the AM ?3/19: NO coumadin, Lovenox once in the AM (before 7 AM) ? ?3/20: PROCEDURE DAY; NO COUMADIN, NO LOVENOX ? ?3/21: Take 1 tablet (5 mg) coumadin, Lovenox once in the AM ?3/22: Take 1 tablet (5 mg) coumadin, Lovenox once in the AM ?3/22: Take 1 tablet (5 mg) coumadin, Lovenox once in the AM ?3/23: Take 1/2 tablet (2.5 mg) coumadin, Lovenox once in the AM ?3/24: Stop lovenox, restart normal dosing; Take 1/2 tablet (2.5 mg) coumadin ?3/25: Take 1/2 tablet (2.5 mg) coumadin ?3/26: Take 1/2 tablet (2.5 mg) coumadin ?3/27: Take 1 tablet (5 mg) coumadin ? ?3/28: Recheck INR; hold coumadin until after INR check ? ? ?Pt has already been scheduled for 3/14 for INR check before holding coumadin.  ?

## 2021-11-07 MED ORDER — ENOXAPARIN SODIUM 120 MG/0.8ML IJ SOSY: PREFILLED_SYRINGE | INTRAMUSCULAR | 0 refills | Status: DC

## 2021-11-07 NOTE — Telephone Encounter (Signed)
[name], Jared Halsted, MD  You 1 hour ago (6:54 AM)  ? ?[place] with me. Thanks!   ? ? ?Received a VM from pt reporting pharmacy has not received script for lovenox yet. Did not send in yet, awaited authorization from PCP.  ? ?Sent in lovenox, 120mg , QD, 7 syringes ?

## 2021-11-07 NOTE — Telephone Encounter (Signed)
Spoke with patient and spoke about DEXA  ?

## 2021-11-07 NOTE — Telephone Encounter (Signed)
Contacted pt and advised lovenox prescription has been sent to pharmacy of choice and dosing instructions will be given tomorrow at apt. Pt verbalized understanding and was appreciative.  ?

## 2021-11-08 ENCOUNTER — Other Ambulatory Visit: Payer: Self-pay

## 2021-11-08 ENCOUNTER — Ambulatory Visit (INDEPENDENT_AMBULATORY_CARE_PROVIDER_SITE_OTHER): Payer: Medicare Other

## 2021-11-08 DIAGNOSIS — Z7901 Long term (current) use of anticoagulants: Secondary | ICD-10-CM

## 2021-11-08 LAB — POCT INR: INR: 2.7 (ref 2.0–3.0)

## 2021-11-08 NOTE — Patient Instructions (Addendum)
Pre visit review using our clinic review tool, if applicable. No additional management support is needed unless otherwise documented below in the visit note. ? ?Hold dose today and then follow lovenox and warfarin instructions below.  ?3/15: Take last dose of coumadin ?3/16: NO coumadin, NO lovenox ?3/17: NO coumadin, Lovenox once in the AM ?3/18: NO coumadin, Lovenox once in the AM ?3/19: NO coumadin, Lovenox once in the AM (before 7 AM) ?  ?3/20: PROCEDURE DAY; NO COUMADIN, NO LOVENOX ?  ?3/21: Take 1 tablet (5 mg) coumadin, Lovenox once in the AM ?3/22: Take 1 tablet (5 mg) coumadin, Lovenox once in the AM ?3/22: Take 1 tablet (5 mg) coumadin, Lovenox once in the AM ?3/23: Take 1/2 tablet (2.5 mg) coumadin, Lovenox once in the AM ?3/24: Stop lovenox, restart normal dosing; Take 1/2 tablet (2.5 mg) coumadin ?3/25: Take 1/2 tablet (2.5 mg) coumadin ?3/26: Take 1/2 tablet (2.5 mg) coumadin ?3/27: Take 1 tablet (5 mg) coumadin ?  ?3/28: Recheck INR; hold coumadin until after INR check ?

## 2021-11-08 NOTE — Progress Notes (Signed)
Hold dose today and then follow lovenox and warfarin instructions below.  ?3/15: Take last dose of coumadin ?3/16: NO coumadin, NO lovenox ?3/17: NO coumadin, Lovenox once in the AM ?3/18: NO coumadin, Lovenox once in the AM ?3/19: NO coumadin, Lovenox once in the AM (before 7 AM) ?  ?3/20: PROCEDURE DAY; NO COUMADIN, NO LOVENOX ?  ?3/21: Take 1 tablet (5 mg) coumadin, Lovenox once in the AM ?3/22: Take 1 tablet (5 mg) coumadin, Lovenox once in the AM ?3/22: Take 1 tablet (5 mg) coumadin, Lovenox once in the AM ?3/23: Take 1/2 tablet (2.5 mg) coumadin, Lovenox once in the AM ?3/24: Stop lovenox, restart normal dosing; Take 1/2 tablet (2.5 mg) coumadin ?3/25: Take 1/2 tablet (2.5 mg) coumadin ?3/26: Take 1/2 tablet (2.5 mg) coumadin ?3/27: Take 1 tablet (5 mg) coumadin ?  ?3/28: Recheck INR; hold coumadin until after INR check ?

## 2021-11-08 NOTE — Progress Notes (Signed)
Patient ID: [name], male   DOB: November 11, 1935, 86 y.o.   MRN: [id number] ? ?Medical screening examination/treatment/procedure(s) were performed by non-physician practitioner and as supervising physician I was immediately available for consultation/collaboration.  I agree with above. Cathlean Cower, MD ? ?

## 2021-11-09 ENCOUNTER — Other Ambulatory Visit: Payer: Self-pay | Admitting: Emergency Medicine

## 2021-11-09 NOTE — Progress Notes (Signed)
?Radiation Oncology         [phone] ?________________________________ ? ?Name: [name] MRN: [id number]  ?Date: 11/10/2021  DOB: 04-28-1936 ? ?Follow-Up Visit Note ? ?CC: Isaac Bliss, Rayford Halsted, MD  Collene Gobble, MD ? ?  ICD-10-CM   ?1. Pulmonary nodules  R91.8 CT CHEST WO CONTRAST  ?  ? ? ?Diagnosis:  The primary encounter diagnosis was Malignant neoplasm of upper lobe of right lung ([place]). A diagnosis of Pulmonary nodules was also pertinent to this visit. ? ?Clinical stage I non-small cell lung cancer ? ?Interval Since Last Radiation: 6 months and 10 days  ? ?Intent: Curative ? ?Radiation Treatment Dates: 04/21/2021 through 05/03/2021 ?Site Technique Total Dose (Gy) Dose per Fx (Gy) Completed Fx Beam Energies  ?Lung, Right: Lung_Rt SBRT 50/50 10 5/5 6X  ? ? ?Narrative:  The patient returns today for routine follow-up and to review recent imaging, he was last seen here for follow up on 06/09/21. Since his last visit, the patient presented to his pulmonologist, Dr. Erin Fulling, on 07/29/21 with ongoing complaints of daily hemoptysis.  Per encounter notes, a discussion was held with his cardiology team and the plan was to decrease his goal INR range to 2-2.5 and monitor for any improvement in the hemoptysis. The patient did report improvement in his hemoptysis after most recent prednisone taper, and remains on prednisone 10mg  daily. Dr. Erin Fulling also discussed the possibility of pursuing CT-guided lung biopsy via interventional radiology in order to get a better idea of the right lower lobe lesion, though the patient declined pursuing this. ? ?Soon after, the patient again returned to the pulmonary clinic on 08/08/21 with complaints of shortness of breath x 1 week and chest congestion. He also stated that he continued to cough up around 1 Tablespoon a day, and reported that the blood changed to a lighter red color. He denied any fever or chest pain, and was noted to still be taking Lasix 40 mg daily and 10 mg  prednisone daily. In regards to his chest congestion, the patient reported cough productive of thick yellow-white secretions with some blood as noted above. Subsequent CXR performed on this same date showed left greater than right lower lobe pneumonia, and possible small ?associated pleural effusion(s). ? ?Per Dr. Erin Fulling, the patient was treated for pneumonia with Augmentin in December after his visit with the pulmonary clinic. ? ?The patient again followed up with Dr. Erin Fulling on 09/28/21. During which time, the patient again endorsed ongoing daily hemoptysis and SOB despite lowering his INR goal. The patient also reported not much improvement with Augmentin. Given findings of new LLL infiltrate from CXR on 08/08/21, Dr. Erin Fulling recommended levaquin for atypical and pseudomonas coverage. If the patient does not improve with this round of antibiotics then Dr. Kelli Churn recommends chest CT and possible bronchoscopy to rule out an opportunistic infection. In regards to the cause of the patient's hemoptysis, Dr. Erin Fulling noted this to remain a mystery. Possible differentials noted by Dr. Erin Fulling included inflammation occurring in the RLL while on coumadin, given that dilated vasculature was noted on bronchoscopy findings of the RLL. Per his most recent follow up visit with Dr. Erin Fulling on 10/12/21, the patient still reports ongoing hemoptysis and SOB.  ? ?Recent chest CT on 10/07/21 demonstrated: evolving postradiation fibrosis at the site of the treated right upper lobe neoplasm; worsening patchy ground-glass attenuation and septal thickening throughout the lungs bilaterally; and a persistent nonspecific mass-like area in the medial aspect of the right lower  lobe, possibly suspicious for slow-growing neoplasm such as adenocarcinoma. Other findings on CT included aortic atherosclerosis, left main and three-vessel coronary artery disease, and diffuse bronchial wall thickening with mild centrilobular and paraseptal emphysema  suggestive of underlying COPD. ? ?On evaluation today the patient reports a good appetite.  He denies any pain within the chest area.  He continues to have hemoptysis primarily in the morning hours upon awakening.  He has some dyspnea on exertion but no significant changes in his breathing. ? ? ? ?Allergies:  has No Known Allergies. ? ?Meds: ?Current Outpatient Medications  ?Medication Sig Dispense Refill  ? allopurinol (ZYLOPRIM) 300 MG tablet Take 1 tablet by mouth once daily 90 tablet 0  ? amLODipine (NORVASC) 5 MG tablet Take 1 tablet by mouth once daily 90 tablet 0  ? enoxaparin (LOVENOX) 120 MG/0.8ML injection Inject 0.8 mL (120 mg) daily under the skin as directed by anticoagulation clinic. 5.6 mL 0  ? Ferrous Sulfate (IRON PO) Take 65 mg by mouth daily.    ? furosemide (LASIX) 40 MG tablet Take 1 tablet by mouth once daily 90 tablet 1  ? Ibuprofen 200 MG CAPS Take 400 mg by mouth daily.    ? potassium chloride SA (KLOR-CON) 20 MEQ tablet Take 1 tablet (20 mEq total) by mouth daily. 90 tablet 3  ? predniSONE (DELTASONE) 20 MG tablet TAKE 1/2 (ONE-HALF) TABLET BY MOUTH ONCE DAILY WITH BREAKFAST (Patient taking differently: Take 10 mg by mouth daily with breakfast.) 45 tablet 0  ? tamsulosin (FLOMAX) 0.4 MG CAPS capsule Take 1 capsule (0.4 mg total) by mouth daily after breakfast. 90 capsule 0  ? traMADol (ULTRAM) 50 MG tablet TAKE 1 TABLET BY MOUTH TWICE DAILY FOR  CHRONIC  PAIN (Patient taking differently: Take 50 mg by mouth 2 (two) times daily.) 60 tablet 0  ? traZODone (DESYREL) 50 MG tablet TAKE 1 TABLET BY MOUTH AT BEDTIME AS NEEDED 90 tablet 0  ? triamcinolone cream (KENALOG) 0.1 % Apply 1 application topically 2 (two) times daily as needed (itching). Around the nose    ? VITAMIN D PO Take 1 tablet by mouth daily.    ? warfarin (COUMADIN) 5 MG tablet Take 1 tablet (5 mg total) by mouth one time only at 4 PM for 1 dose. 5 mg every Mon; 2.5 mg all other days. Take as directed by anticoagulation clinic  (Patient taking differently: Take 5 mg by mouth See admin instructions. 5 mg every Mon; 2.5 mg all other days. Take as directed by anticoagulation clinic) 75 tablet 0  ? ?No current facility-administered medications for this encounter.  ? ? ?Physical Findings: ?The patient is in no acute distress. Patient is alert and oriented. ? height is 5' 5.5" (1.664 m) and weight is 168 lb 2 oz (76.3 kg). His temporal temperature is 97 ?F (36.1 ?C) (abnormal). His blood pressure is 133/67 and his pulse is 88. His respiration is 18 and oxygen saturation is 95%. .  . Lungs are clear to auscultation bilaterally. Heart has regular rate and rhythm. No palpable cervical, supraclavicular, or axillary adenopathy. Abdomen soft, non-tender, normal bowel sounds. ? ? ?Lab Findings: ?Lab Results  ?Component Value Date  ? WBC 7.4 10/25/2021  ? HGB 13.1 10/25/2021  ? HCT 39.8 10/25/2021  ? MCV 97.0 10/25/2021  ? PLT 194.0 10/25/2021  ? ? ?Radiographic Findings: ?No results found. ? ?Impression:  The primary encounter diagnosis was Malignant neoplasm of upper lobe of right lung ([place]). A diagnosis of  Pulmonary nodules was also pertinent to this visit. ? ?Clinical stage I non-small cell lung cancer ? ?No evidence of recurrence on clinical exam today.  Recent chest CT scan shows favorable response to his SBRT concerning his right upper lobe lesion.  As above the patient has persistent hemoptysis.  Recent chest CT scan shows persistence of a masslike area in the medial aspect of the right lower lobe. ? ? ?Plan: Patient will proceed with bronchoscopy later this month under the direction of Dr. Lamonte Sakai to further evaluate potential source for his hemoptysis and likely to evaluate the masslike area in the medial right lower lobe with potential biopsy.  Assuming no new findings on bronchoscopy the patient will be scheduled for routine chest CT scan in 6 months for follow-up of his right upper lobe lesion. ? ? ?25 minutes of total time was spent for this  patient encounter, including preparation, face-to-face counseling with the patient and coordination of care, physical exam, and documentation of the encounter. ?____________________________________ ? ?Jeneen Rinks

## 2021-11-10 ENCOUNTER — Other Ambulatory Visit: Payer: Self-pay

## 2021-11-10 ENCOUNTER — Encounter: Payer: Self-pay | Admitting: Radiation Oncology

## 2021-11-10 ENCOUNTER — Ambulatory Visit
Admission: RE | Admit: 2021-11-10 | Discharge: 2021-11-10 | Disposition: A | Discharge status: Home / Self Care | Payer: Medicare Other | Source: Ambulatory Visit | Attending: Radiation Oncology | Admitting: Radiation Oncology

## 2021-11-10 VITALS — BP 133/67 | HR 88 | Temp 97.0°F | Resp 18 | Ht 65.5 in | Wt 168.1 lb

## 2021-11-10 DIAGNOSIS — C3411 Malignant neoplasm of upper lobe, right bronchus or lung: Secondary | ICD-10-CM | POA: Diagnosis not present

## 2021-11-10 DIAGNOSIS — Z7901 Long term (current) use of anticoagulants: Secondary | ICD-10-CM | POA: Diagnosis not present

## 2021-11-10 DIAGNOSIS — Z791 Long term (current) use of non-steroidal anti-inflammatories (NSAID): Secondary | ICD-10-CM | POA: Insufficient documentation

## 2021-11-10 DIAGNOSIS — Z923 Personal history of irradiation: Secondary | ICD-10-CM | POA: Insufficient documentation

## 2021-11-10 DIAGNOSIS — I2510 Atherosclerotic heart disease of native coronary artery without angina pectoris: Secondary | ICD-10-CM | POA: Insufficient documentation

## 2021-11-10 DIAGNOSIS — R918 Other nonspecific abnormal finding of lung field: Secondary | ICD-10-CM | POA: Diagnosis not present

## 2021-11-10 DIAGNOSIS — J432 Centrilobular emphysema: Secondary | ICD-10-CM | POA: Diagnosis not present

## 2021-11-10 DIAGNOSIS — R042 Hemoptysis: Secondary | ICD-10-CM | POA: Insufficient documentation

## 2021-11-10 DIAGNOSIS — Z08 Encounter for follow-up examination after completed treatment for malignant neoplasm: Secondary | ICD-10-CM | POA: Diagnosis not present

## 2021-11-10 DIAGNOSIS — Z85118 Personal history of other malignant neoplasm of bronchus and lung: Secondary | ICD-10-CM | POA: Diagnosis not present

## 2021-11-10 DIAGNOSIS — Z7952 Long term (current) use of systemic steroids: Secondary | ICD-10-CM | POA: Diagnosis not present

## 2021-11-10 LAB — SARS CORONAVIRUS 2 (TAT 6-24 HRS): SARS Coronavirus 2: NEGATIVE

## 2021-11-10 NOTE — Progress Notes (Signed)
[name] is here today for follow up post radiation to the lung. ? ?Lung Side: right ? ?Completed radiation treatment on: 05/03/2021 ? ?Does the patient complain of any of the following: ?Pain:denies ?Shortness of breath w/wo exertion: shortness of breath with activity ?Cough: productive cough ?Hemoptysis: yes, has appointment for bronchoscopy ?Pain with swallowing: denies ?Swallowing/choking concerns: denies ?Appetite: good ?Energy Level: good ?Post radiation skin Changes: denies ? ? ? ?Additional comments if applicable: nothing of note ? ?Vitals:  ? 11/10/21 0817  ?BP: 133/67  ?Pulse: 88  ?Resp: 18  ?Temp: (!) 97 ?F (36.1 ?C)  ?TempSrc: Temporal  ?SpO2: 95%  ?Weight: 168 lb 2 oz (76.3 kg)  ?Height: 5' 5.5" (1.664 m)  ? ? ? ?

## 2021-11-10 NOTE — Telephone Encounter (Signed)
Pt reports there is a problem with the dates on his lovenox bridge dosing. He reports there are two 3/22 instructions. Advised pt to change the second 3/22 instructions to 3/23 and change 3/23 to 3/24 and so on down until the day he returns for INR check. Pt read back and wrote down instructions and verbalized understanding.  ?

## 2021-11-11 ENCOUNTER — Encounter (HOSPITAL_COMMUNITY): Payer: Self-pay | Admitting: Emergency Medicine

## 2021-11-11 ENCOUNTER — Ambulatory Visit: Payer: Medicare Other

## 2021-11-11 NOTE — Progress Notes (Signed)
DUE TO COVID-19 ONLY ONE VISITOR IS ALLOWED TO COME WITH YOU AND STAY IN THE WAITING ROOM ONLY DURING PRE OP AND PROCEDURE DAY OF SURGERY.  ? ?PCP - Dr Isaac Bliss  ?Cardiologist - Dr Kirk Ruths ?Internal Med - Dr Cathlean Cower ? ?CT Chest x-ray - 10/07/21 ?EKG - 03/07/21 ?Stress Test - 01/30/05 ?ECHO - 01/19/21 ?Cardiac Cath - n/a ? ?ICD Pacemaker/Loop - n/a ? ?Sleep Study -  n/a ?CPAP - none ? ?Blood Thinner Instructions:  NO Lovenox, no Coumadin on DOS per MD.  Patient will be on a Lovenox bridge.  Last dose will be on 11/09/21 for both meds. ? ?Anesthesia review: Yes ? ?STOP now taking any Aspirin (unless otherwise instructed by your surgeon), Aleve, Naproxen, Ibuprofen, Motrin, Advil, Goody's, BC's, all herbal medications, fish oil, and all vitamins.  ? ?Coronavirus Screening ?Covid test on 11/09/21 was negative.    ?Do you have any of the following symptoms:  ?Cough yes/no: No ?Fever (>100.57F)  yes/no: No ?Runny nose yes/no: No ?Sore throat yes/no: No ?Difficulty breathing/shortness of breath  yes/no: No ? ?Have you traveled in the last 14 days and where? yes/no: No ? ?Patient verbalized understanding of instructions that were given via phone. ?

## 2021-11-11 NOTE — Progress Notes (Signed)
Anesthesia Chart Review: ? ?Pt is a same day work up ? ? Case: [id number] Date/Time: 11/14/21 0730  ? Procedure: ROBOTIC ASSISTED NAVIGATIONAL BRONCHOSCOPY (Right)  ? Anesthesia type: General  ? Pre-op diagnosis: RIGHT LOWER LOBE PULMONARY NODULE  ? Location: [facility] 3 / [facility]  ? Surgeons: Collene Gobble, MD  ? ?  ? ?DISCUSSION: ?Pt is 86 years old with hx aortic stenosis (s/p AVR 2006), thoracic aortic aneurysm (s/p repair 2006), permanent atrial fibrillation, HTN, carotid artery disease ? ?Last dose coumadin 11/09/21; start lovenox bridge 3/17; last dose lovenox 3/19 in AM ? ?PROVIDERS: ?- PCP is Isaac Bliss, Rayford Halsted, MD ?- Cardiologist is Kirk Ruths, MD ? ? ?LABS: Labs reviewed: Acceptable for surgery. ?- CBC w/diff 10/25/21: RBC 4.1 ?- CMP 10/25/21: normal ?- HbA1c 10/25/21: 5.4 ? ? ?IMAGES: ?CT chest 10/07/21:  ?1. Evolving postradiation fibrosis at the site of the treated right upper lobe neoplasm. ?2. Worsening patchy ground-glass attenuation and septal thickening throughout the lungs bilaterally. This is nonspecific, but has been chronic and intermittent over the past year, but most severe on today's examination. It is unlikely that these regions are within the radiation treatment window. Clinical correlation for signs of worsening chronic atypical infection is suggested. ?3. Persistent mass-like area in the medial aspect of the right lower lobe. This demonstrated low-level hypermetabolism on prior PET-CT, which is nonspecific. The possibility of slow-growing neoplasm such as adenocarcinoma could be considered, and in the appropriate clinical setting could account for the other parenchymal changes in the lungs if it was associated with aerogenous spread of disease. Tissue sampling of this area should be considered if not already ?obtained. ?4. Aortic atherosclerosis, in addition to left main and three-vessel coronary artery disease. ?5. Diffuse bronchial wall thickening with mild  centrilobular and paraseptal emphysema; imaging findings suggestive of underlying COPD. ? ?CXR 08/08/21:  ?- Left greater than right lower lobe pneumonia. Possible small associated pleural effusion(s). ?- Followup PA and lateral chest X-ray is recommended in 3-4 weeks following trial of antibiotic therapy to ensure resolution and exclude underlying malignancy. ? ? ?EKG 03/07/21: Atrial fibrillation with premature ventricular or aberrantly conducted complexes. Incomplete RBBB ? ?CV: ?Echo 01/19/21:  ?1. Left ventricular ejection fraction, by estimation, is 70 to 75%. The left ventricle has hyperdynamic function. The left ventricle has no regional wall motion abnormalities. Left ventricular diastolic function could not be evaluated.  ?2. Right ventricular systolic function is normal. The right ventricular size is normal. There is normal pulmonary artery systolic pressure.  ?3. Left atrial size was moderately dilated.  ?4. Right atrial size was moderately dilated.  ?5. The mitral valve is normal in structure. Mild mitral valve regurgitation. No evidence of mitral stenosis.  ?6. The aortic valve has been repaired/replaced. Aortic valve regurgitation is mild. No aortic stenosis is present. There is a bioprosthetic valve present in the aortic position. Procedure Date: 02/2005. Aortic valve mean gradient measures 12.8 mmHg. Aortic valve Vmax measures 2.50 m/s.  ?7. The inferior vena cava is normal in size with greater than 50% respiratory variability, suggesting right atrial pressure of 3 mmHg.  ?- Comparison(s): 12/13/17 EF 55-60%. AV 25mHg mean PG, 224mg peak PG. PA pressure 3340m.  ? ? ?Past Medical History:  ?Diagnosis Date  ? Aortic stenosis   ? Atrial fibrillation ([place] ? Baker cyst   ? Left knee  ? DJD (degenerative joint disease)   ? knees  ? Dysrhythmia   ? Eczema   ?  Erectile dysfunction   ? Gout   ? History of hiatal hernia   ? History of radiation therapy   ? Right lung- 04/21/21-05/03/21 Dr. Gery Pray  ?  HTN (hypertension)   ? MVP (mitral valve prolapse)   ? Shortness of breath dyspnea   ? with activity  ? ? ?Past Surgical History:  ?Procedure Laterality Date  ?  polyps vocal cord  1994  ? AORTIC VALVE REPLACEMENT  ~2004  ? BRONCHIAL BIOPSY  03/07/2021  ? Procedure: BRONCHIAL BIOPSIES;  Surgeon: Collene Gobble, MD;  Location: [facility] ENDOSCOPY;  Service: Pulmonary;;  ? BRONCHIAL BRUSHINGS  03/07/2021  ? Procedure: BRONCHIAL BRUSHINGS;  Surgeon: Collene Gobble, MD;  Location: [facility] ENDOSCOPY;  Service: Pulmonary;;  ? BRONCHIAL NEEDLE ASPIRATION BIOPSY  03/07/2021  ? Procedure: BRONCHIAL NEEDLE ASPIRATION BIOPSIES;  Surgeon: Collene Gobble, MD;  Location: [facility] ENDOSCOPY;  Service: Pulmonary;;  ? BRONCHIAL WASHINGS  09/21/2020  ? Procedure: BRONCHIAL WASHINGS;  Surgeon: Freddi Starr, MD;  Location: [facility] ENDOSCOPY;  Service: Pulmonary;;  BAL ?  ? BRONCHIAL WASHINGS  03/07/2021  ? Procedure: BRONCHIAL WASHINGS;  Surgeon: Collene Gobble, MD;  Location[facility] END[facility]COPY;  Service: Pulmonary;;  ? FIDUCIAL MARKER PLACEMENT  03/07/2021  ? Procedure: FIDUCIAL MARKER PLACEMENT;  Surgeon: Collene Gobble, MD;  Location: [facility] ENDOSCOPY;  Service: Pulmonary;;  ? HERNIA REPAIR  [phone]  ? HERNIA REPAIR  ~1983  ? LUMBAR LAMINECTOMY/DECOMPRESSION MICRODISCECTOMY N/A 01/15/2015  ? Procedure: 2 LEVEL DECOMPRESSIVE LUMBAR LAMINECTOMY L3-L4,L4-L5;  Surgeon: Latanya Maudlin, MD;  Location: [facility] ORS;  Service: Orthopedics;  Laterality: N/A;  ? LUMBAR LAMINECTOMY/DECOMPRESSION MICRODISCECTOMY N/A 05/01/2017  ? Procedure: Lumbar one-Lumbar two, Lumbar two-Lumbar three, Lumbar three-Lumbar four Laminectomy; Evacuation of hematoma;  Surgeon: Consuella Lose, MD;  Location: [facility];  Service: Neurosurgery;  Laterality: N/A;  ? right total hip  ~4 years ago  ? ROTATOR CUFF REPAIR Left ~2009  ? TOTAL KNEE ARTHROPLASTY Left 04/14/2015  ? Procedure: LEFT TOTAL KNEE ARTHROPLASTY;  Surgeon: Latanya Maudlin, MD;  Location: [facility] ORS;  Service: Orthopedics;  Laterality: Left;   ? VIDEO BRONCHOSCOPY N/A 09/21/2020  ? Procedure: VIDEO BRONCHOSCOPY WITHOUT FLUORO;  Surgeon: Freddi Starr, MD;  Location: [facility] ENDOSCOPY;  Service: Pulmonary;  Laterality: N/A;  ? VIDEO BRONCHOSCOPY WITH ENDOBRONCHIAL NAVIGATION Right 03/07/2021  ? Procedure: VIDEO BRONCHOSCOPY WITH ENDOBRONCHIAL NAVIGATION;  Surgeon: Collene Gobble, MD;  Location: [facility] ENDOSCOPY;  Service: Pulmonary;  Laterality: Right;  ? ? ?MEDICATIONS: ?No current facility-administered medications for this encounter.  ? ? allopurinol (ZYLOPRIM) 300 MG tablet  ? amLODipine (NORVASC) 5 MG tablet  ? furosemide (LASIX) 40 MG tablet  ? Ibuprofen 200 MG CAPS  ? potassium chloride SA (KLOR-CON) 20 MEQ tablet  ? predniSONE (DELTASONE) 20 MG tablet  ? tamsulosin (FLOMAX) 0.4 MG CAPS capsule  ? traMADol (ULTRAM) 50 MG tablet  ? traZODone (DESYREL) 50 MG tablet  ? triamcinolone cream (KENALOG) 0.1 %  ? VITAMIN D PO  ? warfarin (COUMADIN) 5 MG tablet  ? enoxaparin (LOVENOX) 120 MG/0.8ML injection  ? Ferrous Sulfate (IRON PO)  ? ?Last dose coumadin 11/09/21; start lovenox bridge 3/17; last dose lovenox 3/19 in AM ? ? ?If no changes, I anticipate pt can proceed with surgery as scheduled.  ? ?Willeen Cass, PhD, FNP-BC ?[facility]/Anesthesiology ?Phone: [phone] ?11/11/2021 1:34 PM ? ?

## 2021-11-11 NOTE — Anesthesia Preprocedure Evaluation (Addendum)
Anesthesia Evaluation  ?Patient identified by MRN, date of birth, ID band ?Patient awake ? ? ? ?Reviewed: ?Allergy & Precautions, NPO status , Patient's Chart, lab work & pertinent test results ? ?Airway ?Mallampati: II ? ?TM Distance: >3 FB ?Neck ROM: Full ? ? ? Dental ? ?(+) Poor Dentition, Teeth Intact, Dental Advisory Given ?  ?Pulmonary ?former smoker,  ?RLL pulmonary nodule ? ?CT chest 10/07/21:  ?1. Evolving postradiation fibrosis at the site of the treated right upper lobe neoplasm. ?2. Worsening patchy ground-glass attenuation and septal thickening throughout the lungs bilaterally. This is nonspecific, but has been chronic and intermittent over the past year, but most severe on today's examination. It is unlikely that these regions are within the radiation treatment window. Clinical correlation for signs of worsening chronic atypical infection is suggested. ?3. Persistent mass-like area in the medial aspect of the right lower lobe. This demonstrated low-level hypermetabolism on prior PET-CT, which is nonspecific. The possibility of slow-growing neoplasm such as adenocarcinoma could be considered, and in the appropriate clinical setting could account for the other parenchymal changes in the lungs if it was associated with aerogenous spread of disease. Tissue sampling of this area should be considered if not already ?obtained. ?4. Aortic atherosclerosis, in addition to left main and three-vessel coronary artery disease. ?5. Diffuse bronchial wall thickening with mild centrilobular and paraseptal emphysema; imaging findings suggestive of underlying COPD. ? ?Still coughing up some blood, for about 1.5years now ? ?Vocal cord surgery many years ago ?  ?Pulmonary exam normal ?breath sounds clear to auscultation ? ? ? ? ? ? Cardiovascular ?hypertension, Pt. on medications ?Normal cardiovascular exam+ dysrhythmias (coumadin/lovenox bridge- lovenox LD yesterday 9am) Atrial  Fibrillation + Valvular Problems/Murmurs (s/p AVR 2004. mild MR and mild AI on most recent echo) MVP, MR and AI  ?Rhythm:Regular Rate:Normal ? ?thoracic aortic aneurysm (s/p repair 2006) ? ?Echo 01/19/21:  ?1. Left ventricular ejection fraction, by estimation, is 70 to 75%. The left ventricle has hyperdynamic function. The left ventricle has no regional wall motion abnormalities. Left ventricular diastolic function could not be evaluated.  ?2. Right ventricular systolic function is normal. The right ventricular size is normal. There is normal pulmonary artery systolic pressure.  ?3. Left atrial size was moderately dilated.  ?4. Right atrial size was moderately dilated.  ?5. The mitral valve is normal in structure. Mild mitral valve regurgitation. No evidence of mitral stenosis.  ?6. The aortic valve has been repaired/replaced. Aortic valve regurgitation is mild. No aortic stenosis is present. There is a bioprosthetic valve present in the aortic position. Procedure Date: 02/2005. Aortic valve mean gradient measures 12.8 mmHg. Aortic valve Vmax measures 2.50 m/s.  ?7. The inferior vena cava is normal in size with greater than 50% respiratory variability, suggesting right atrial pressure of 3 mmHg.  ?- Comparison(s): 12/13/17 EF 55-60%. AV 23mmHg mean PG, 46mmHg peak PG. PA pressure 38mmHg.  ?? ?  ?Neuro/Psych ?PSYCHIATRIC DISORDERS Anxiety negative neurological ROS ?   ? GI/Hepatic ?Neg liver ROS, hiatal hernia,   ?Endo/Other  ?negative endocrine ROS ? Renal/GU ?negative Renal ROS  ?negative genitourinary ?  ?Musculoskeletal ? ?(+) Arthritis , Osteoarthritis,   ? Abdominal ?  ?Peds ? Hematology ?negative hematology ROS ?(+)   ?Anesthesia Other Findings ? ? Reproductive/Obstetrics ?negative OB ROS ? ?  ? ? ? ? ? ? ? ? ? ? ? ? ? ?  ?  ? ? ? ? ? ? ?Anesthesia Physical ?Anesthesia Plan ? ?ASA: 3 ? ?Anesthesia Plan:  General  ? ?Post-op Pain Management: Tylenol PO (pre-op)*  ? ?Induction: Intravenous ? ?PONV Risk Score and  Plan: 2 and Ondansetron and Treatment may vary due to age or medical condition ? ?Airway Management Planned: Oral ETT ? ?Additional Equipment: None ? ?Intra-op Plan:  ? ?Post-operative Plan: Extubation in OR ? ?Informed Consent: I have reviewed the patients History and Physical, chart, labs and discussed the procedure including the risks, benefits and alternatives for the proposed anesthesia with the patient or authorized representative who has indicated his/her understanding and acceptance.  ? ? ? ?Dental advisory given ? ?Plan Discussed with: CRNA ? ?Anesthesia Plan Comments: (Last bronch in July 2022: ?Laryngoscope Size: Glidescope and 3 ?Grade View: Grade I ?Tube type: Oral ?Tube size: 7.5 mm ?Number of attempts: 1)  ? ? ? ? ?Anesthesia Quick Evaluation ? ?

## 2021-11-13 ENCOUNTER — Encounter (HOSPITAL_COMMUNITY): Payer: Self-pay | Admitting: Emergency Medicine

## 2021-11-14 ENCOUNTER — Ambulatory Visit (HOSPITAL_COMMUNITY): Payer: Medicare Other | Admitting: Emergency Medicine

## 2021-11-14 ENCOUNTER — Encounter (HOSPITAL_COMMUNITY)
Admission: RE | Disposition: A | Discharge status: Home / Self Care | Payer: Self-pay | Source: Home / Self Care | Attending: Emergency Medicine

## 2021-11-14 ENCOUNTER — Other Ambulatory Visit: Payer: Self-pay | Admitting: Internal Medicine

## 2021-11-14 ENCOUNTER — Encounter (HOSPITAL_COMMUNITY): Payer: Self-pay | Admitting: Emergency Medicine

## 2021-11-14 ENCOUNTER — Ambulatory Visit (HOSPITAL_COMMUNITY): Payer: Medicare Other

## 2021-11-14 ENCOUNTER — Ambulatory Visit (HOSPITAL_COMMUNITY)
Admission: RE | Admit: 2021-11-14 | Discharge: 2021-11-14 | Disposition: A | Discharge status: Home / Self Care | Payer: Medicare Other | Attending: Emergency Medicine | Admitting: Emergency Medicine

## 2021-11-14 ENCOUNTER — Other Ambulatory Visit: Payer: Self-pay | Admitting: Cardiology

## 2021-11-14 ENCOUNTER — Other Ambulatory Visit: Payer: Self-pay

## 2021-11-14 ENCOUNTER — Ambulatory Visit (HOSPITAL_BASED_OUTPATIENT_CLINIC_OR_DEPARTMENT_OTHER): Payer: Medicare Other | Admitting: Emergency Medicine

## 2021-11-14 DIAGNOSIS — I10 Essential (primary) hypertension: Secondary | ICD-10-CM | POA: Diagnosis not present

## 2021-11-14 DIAGNOSIS — Z79899 Other long term (current) drug therapy: Secondary | ICD-10-CM | POA: Insufficient documentation

## 2021-11-14 DIAGNOSIS — Z87891 Personal history of nicotine dependence: Secondary | ICD-10-CM | POA: Insufficient documentation

## 2021-11-14 DIAGNOSIS — R918 Other nonspecific abnormal finding of lung field: Secondary | ICD-10-CM

## 2021-11-14 DIAGNOSIS — Z952 Presence of prosthetic heart valve: Secondary | ICD-10-CM | POA: Diagnosis not present

## 2021-11-14 DIAGNOSIS — K449 Diaphragmatic hernia without obstruction or gangrene: Secondary | ICD-10-CM | POA: Diagnosis not present

## 2021-11-14 DIAGNOSIS — I4891 Unspecified atrial fibrillation: Secondary | ICD-10-CM | POA: Insufficient documentation

## 2021-11-14 DIAGNOSIS — Z85118 Personal history of other malignant neoplasm of bronchus and lung: Secondary | ICD-10-CM | POA: Diagnosis not present

## 2021-11-14 DIAGNOSIS — M1990 Unspecified osteoarthritis, unspecified site: Secondary | ICD-10-CM | POA: Insufficient documentation

## 2021-11-14 DIAGNOSIS — Z7901 Long term (current) use of anticoagulants: Secondary | ICD-10-CM

## 2021-11-14 DIAGNOSIS — R911 Solitary pulmonary nodule: Secondary | ICD-10-CM

## 2021-11-14 DIAGNOSIS — R042 Hemoptysis: Secondary | ICD-10-CM | POA: Diagnosis not present

## 2021-11-14 DIAGNOSIS — Z923 Personal history of irradiation: Secondary | ICD-10-CM | POA: Insufficient documentation

## 2021-11-14 HISTORY — PX: BRONCHIAL WASHINGS: SHX5105

## 2021-11-14 HISTORY — PX: BRONCHIAL NEEDLE ASPIRATION BIOPSY: SHX5106

## 2021-11-14 HISTORY — PX: BRONCHIAL BIOPSY: SHX5109

## 2021-11-14 HISTORY — PX: BRONCHIAL BRUSHINGS: SHX5108

## 2021-11-14 HISTORY — PX: VIDEO BRONCHOSCOPY WITH RADIAL ENDOBRONCHIAL ULTRASOUND: SHX6849

## 2021-11-14 SURGERY — BRONCHOSCOPY, WITH BIOPSY USING ELECTROMAGNETIC NAVIGATION
Anesthesia: General | Laterality: Right

## 2021-11-14 MED ORDER — DEXAMETHASONE SODIUM PHOSPHATE 10 MG/ML IJ SOLN
INTRAMUSCULAR | Status: DC | PRN
  Administered 2021-11-14: 10 mg via INTRAVENOUS

## 2021-11-14 MED ORDER — FENTANYL CITRATE (PF) 250 MCG/5ML IJ SOLN
INTRAMUSCULAR | Status: DC | PRN
  Administered 2021-11-14: 25 ug via INTRAVENOUS

## 2021-11-14 MED ORDER — CHLORHEXIDINE GLUCONATE 0.12 % MT SOLN
OROMUCOSAL | Status: AC
  Administered 2021-11-14: 15 mL
  Filled 2021-11-14: qty 15

## 2021-11-14 MED ORDER — SUGAMMADEX SODIUM 200 MG/2ML IV SOLN
INTRAVENOUS | Status: DC | PRN
  Administered 2021-11-14: 200 mg via INTRAVENOUS

## 2021-11-14 MED ORDER — LIDOCAINE 2% (20 MG/ML) 5 ML SYRINGE
INTRAMUSCULAR | Status: DC | PRN
Start: 2021-11-14 — End: 2021-11-14
  Administered 2021-11-14: 60 mg via INTRAVENOUS

## 2021-11-14 MED ORDER — PROPOFOL 10 MG/ML IV BOLUS
INTRAVENOUS | Status: DC | PRN
Start: 2021-11-14 — End: 2021-11-14
  Administered 2021-11-14: 100 mg via INTRAVENOUS

## 2021-11-14 MED ORDER — PHENYLEPHRINE 40 MCG/ML (10ML) SYRINGE FOR IV PUSH (FOR BLOOD PRESSURE SUPPORT)
PREFILLED_SYRINGE | INTRAVENOUS | Status: DC | PRN
Start: 2021-11-14 — End: 2021-11-14
  Administered 2021-11-14 (×2): 80 ug via INTRAVENOUS
  Administered 2021-11-14: 120 ug via INTRAVENOUS
  Administered 2021-11-14: 80 ug via INTRAVENOUS

## 2021-11-14 MED ORDER — PHENYLEPHRINE HCL-NACL 20-0.9 MG/250ML-% IV SOLN
INTRAVENOUS | Status: DC | PRN
  Administered 2021-11-14: 40 ug/min via INTRAVENOUS

## 2021-11-14 MED ORDER — ACETAMINOPHEN 500 MG PO TABS
1000.0000 mg | ORAL_TABLET | Freq: Once | ORAL | Status: AC
  Administered 2021-11-14: 1000 mg via ORAL
  Filled 2021-11-14: qty 2

## 2021-11-14 MED ORDER — FENTANYL CITRATE (PF) 100 MCG/2ML IJ SOLN: 25.0000 ug | INTRAMUSCULAR | Status: DC | PRN

## 2021-11-14 MED ORDER — ONDANSETRON HCL 4 MG/2ML IJ SOLN
INTRAMUSCULAR | Status: DC | PRN
  Administered 2021-11-14: 4 mg via INTRAVENOUS

## 2021-11-14 MED ORDER — LACTATED RINGERS IV SOLN: INTRAVENOUS | Status: DC | PRN

## 2021-11-14 MED ORDER — ROCURONIUM BROMIDE 10 MG/ML (PF) SYRINGE
PREFILLED_SYRINGE | INTRAVENOUS | Status: DC | PRN
  Administered 2021-11-14: 100 mg via INTRAVENOUS

## 2021-11-14 MED ORDER — WARFARIN SODIUM 5 MG PO TABS: 5.0000 mg | ORAL_TABLET | ORAL | Status: DC

## 2021-11-14 MED ORDER — ONDANSETRON HCL 4 MG/2ML IJ SOLN: 4.0000 mg | Freq: Once | INTRAMUSCULAR | Status: DC | PRN

## 2021-11-14 NOTE — Op Note (Signed)
Video Bronchoscopy with Robotic Assisted Bronchoscopic Navigation  ? ?Date of Operation: 11/14/2021  ? ?Pre-op Diagnosis: Hemoptysis, right lower lobe nodule, left upper lobe infiltrate ? ?Post-op Diagnosis: Same ? ?Surgeon: Baltazar Apo ? ?Assistants: None ? ?Anesthesia: General endotracheal anesthesia ? ?Operation: Flexible video fiberoptic bronchoscopy with robotic assistance and biopsies. ? ?Estimated Blood Loss: Minimal ? ?Complications: None ? ?Indications and History: ?[name] is a 86 y.o. male with history of right upper lobe non-small cell lung cancer diagnosed in July 2022, status post SBRT.  He also had a right lower lobe rounded opacity that was negative on that bronchoscopy.  He is continue to have hemoptysis of unclear etiology.  He has a hazy left upper lobe groundglass infiltrate on most recent imaging.  The right lower lobe rounded opacity is somewhat smaller but still present, question scar.  Recommendation made to evaluate hemoptysis, obtain culture data, obtain a tissue diagnosis from the right lower lobe via robotic assisted navigational bronchoscopy. The risks, benefits, complications, treatment options and expected outcomes were discussed with the patient.  The possibilities of pneumothorax, pneumonia, reaction to medication, pulmonary aspiration, perforation of a viscus, bleeding, failure to diagnose a condition and creating a complication requiring transfusion or operation were discussed with the patient who freely signed the consent.   ? ?Description of Procedure: ?The patient was seen in the Preoperative Area, was examined and was deemed appropriate to proceed.  The patient was taken to [facility] endoscopy room 3, identified as [name] and the procedure verified as Flexible Video Fiberoptic Bronchoscopy.  A Time Out was held and the above information confirmed.  ? ?Prior to the date of the procedure a high-resolution CT scan of the chest was performed. Utilizing ION software  program a virtual tracheobronchial tree was generated to allow the creation of distinct navigation pathways to the patient's parenchymal abnormalities. After being taken to the operating room general anesthesia was initiated and the patient  was orally intubated. The video fiberoptic bronchoscope was introduced via the endotracheal tube and a general inspection was performed which showed normal right and left lung anatomy.  There was some thin bloody secretions noted bilaterally.  After suctioning it appeared that there was some recurrent thin blood-tinged mucus on the right side.  It was difficult to definitively localize any segmental airway that was the source of blood. Aspiration of the bilateral mainstems was completed to remove any remaining secretions.  Serial BAL was performed in the left upper lobe for cell count, microbiology, cytology.  Secretions were slightly pink, no significant change in color or bleeding noted on the second wash.  The robotic catheter inserted into patient's endotracheal tube.  ? ?Target #1 right lower lobe nodular opacity: ?The distinct navigation pathways prepared prior to this procedure were then utilized to navigate to patient's lesion identified on CT scan. The robotic catheter was secured into place and the vision probe was withdrawn.  Lesion location was approximated using fluoroscopy and radial endobronchial ultrasound for peripheral targeting.  Local registration and targeting was performed using Cios three-dimensional imaging.  Under fluoroscopic guidance transbronchial needle brushings, transbronchial needle biopsies, and transbronchial forceps biopsies were performed to be sent for cytology and pathology. A bronchioalveolar lavage was performed in the right lower lobe adjacent to the nodular opacity and sent for cytology. ? ?At the end of the procedure a general airway inspection was performed and there was no evidence of active bleeding. The bronchoscope was removed.   The patient tolerated the procedure well.  There was no significant blood loss and there were no obvious complications. A post-procedural chest x-ray is pending. ? ?Samples Target #1: ?1. Transbronchial needle brushings from right lower lobe nodular opacity ?2. Transbronchial Wang needle biopsies from right lower lobe nodular opacity ?3. Transbronchial forceps biopsies from right lower lobe nodular opacity ?4. Bronchoalveolar lavage from right lower lobe ? ?Other samples: ?1.  Left upper lobe BAL No. 1 for cell count, microbiology, cytology ?2.  Left upper lobe BAL No. 2 for cell count, microbiology ? ? ?Plans:  ?The patient will be discharged from the PACU to home when recovered from anesthesia and after chest x-ray is reviewed. We will review the cytology, pathology and microbiology results with the patient when they become available. Outpatient followup will be with Dr. Erin Fulling ? ?Baltazar Apo, MD, PhD ?11/14/2021, 8:41 AM ?[facility] ?[phone] or if no answer before 7:00PM call [phone] ?For any issues after 7:00PM please call eLink [phone] ?.  ? ?

## 2021-11-14 NOTE — Anesthesia Procedure Notes (Signed)
Procedure Name: Intubation ?Date/Time: 11/14/2021 7:44 AM ?Performed by: Carolan Clines, CRNA ?Pre-anesthesia Checklist: Patient identified, Emergency Drugs available, Suction available and Patient being monitored ?Patient Re-evaluated:Patient Re-evaluated prior to induction ?Oxygen Delivery Method: Circle System Utilized ?Preoxygenation: Pre-oxygenation with 100% oxygen ?Induction Type: IV induction ?Ventilation: Mask ventilation without difficulty ?Laryngoscope Size: Mac and 4 ?Grade View: Grade I ?Tube type: Oral ?Tube size: 8.5 mm ?Number of attempts: 1 ?Airway Equipment and Method: Stylet ?Placement Confirmation: ETT inserted through vocal cords under direct vision, positive ETCO2 and breath sounds checked- equal and bilateral ?Secured at: 23 cm ?Tube secured with: Tape ?Dental Injury: Teeth and Oropharynx as per pre-operative assessment  ? ? ? ? ?

## 2021-11-14 NOTE — H&P (Signed)
[name] is an 86 y.o. male.   ?Chief Complaint: Hemoptysis, pulmonary nodule ?HPI: 86 year old man with a history of aortic valve replacement, atrial fibrillation, right upper lobe non-small cell lung cancer post SBRT (through 04/2021).  Followed by Dr. Erin Fulling in our office.  He has had persistent intermittent hemoptysis of unclear etiology.  The right upper lobe nodule shows postradiation changes.  He has a right lower lobe nodular opacity that was negative for malignancy on his original bronchoscopy.  There is also a groundglass left upper lobe infiltrate of unclear significance on most recent imaging 10/08/2021.  He was treated empirically in February with levofloxacin.  Continues to see some intermittent hemoptysis.  Plan for repeat bronchoscopy to further evaluate.  He stopped his Coumadin and has undertaken enoxaparin bridge.  He states that he has had some increased dyspnea especially in the morning over the last week.  No other changes.  No cough, no chest pain. ? ?Past Medical History:  ?Diagnosis Date  ? Aortic stenosis   ? Atrial fibrillation (Cataract)   ? Baker cyst   ? Left knee  ? DJD (degenerative joint disease)   ? knees  ? Dysrhythmia   ? Eczema   ? Erectile dysfunction   ? Gout   ? History of hiatal hernia   ? History of radiation therapy   ? Right lung- 04/21/21-05/03/21 Dr. Gery Pray  ? HTN (hypertension)   ? MVP (mitral valve prolapse)   ? Shortness of breath dyspnea   ? with activity  ? ? ?Past Surgical History:  ?Procedure Laterality Date  ?  polyps vocal cord  1994  ? AORTIC VALVE REPLACEMENT  ~2004  ? BRONCHIAL BIOPSY  03/07/2021  ? Procedure: BRONCHIAL BIOPSIES;  Surgeon: Collene Gobble, MD;  Location: [facility] ENDOSCOPY;  Service: Pulmonary;;  ? BRONCHIAL BRUSHINGS  03/07/2021  ? Procedure: BRONCHIAL BRUSHINGS;  Surgeon: Collene Gobble, MD;  Location: [facility] ENDOSCOPY;  Service: Pulmonary;;  ? BRONCHIAL NEEDLE ASPIRATION BIOPSY  03/07/2021  ? Procedure: BRONCHIAL NEEDLE ASPIRATION BIOPSIES;   Surgeon: Collene Gobble, MD;  Location: [facility] ENDOSCOPY;  Service: Pulmonary;;  ? BRONCHIAL WASHINGS  09/21/2020  ? Procedure: BRONCHIAL WASHINGS;  Surgeon: Freddi Starr, MD;  Location: [facility] ENDOSCOPY;  Service: Pulmonary;;  BAL ?  ? BRONCHIAL WASHINGS  03/07/2021  ? Procedure: BRONCHIAL WASHINGS;  Surgeon: Collene Gobble, MD;  Location: [facility] ENDOSCOPY;  Service: Pulmonary;;  ? FIDUCIAL MARKER PLACEMENT  03/07/2021  ? Procedure: FIDUCIAL MARKER PLACEMENT;  Surgeon: Collene Gobble, MD;  Location: [facility] ENDOSCOPY;  Service: Pulmonary;;  ? HERNIA REPAIR  [phone]  ? HERNIA REPAIR  ~1983  ? LUMBAR LAMINECTOMY/DECOMPRESSION MICRODISCECTOMY N/A 01/15/2015  ? Procedure: 2 LEVEL DECOMPRESSIVE LUMBAR LAMINECTOMY L3-L4,L4-L5;  Surgeon: Latanya Maudlin, MD;  Location: [facility]S;  Service: Orthopedics;  Laterality: N/A;  ? LUMBAR LAMINECTOMY/DECOMPRESSION MICRODISCECTOMY N/A 05/01/2017  ? Procedure: Lumbar one-Lumbar two, Lumbar two-Lumbar three, Lumbar three-Lumbar four Laminectomy; Evacuation of hematoma;  Surgeon: Consuella Lose, MD;  Location: [facility];  Service: Neurosurgery;  Laterality: N/A;  ? right total hip  ~4 years ago  ? ROTATOR CUFF REPAIR Left ~2009  ? TOTAL KNEE ARTHROPLASTY Left 04/14/2015  ? Procedure: LEFT TOTAL KNEE ARTHROPLASTY;  Surgeon: Latanya Maudlin, MD;  Location: [facility];  Service: Orthopedics;  Laterality: Left;  ? VIDEO BRONCHOSCOPY N/A 09/21/2020  ? Procedure: VIDEO BRONCHOSCOPY WITHOUT FLUORO;  Surgeon: Freddi Starr, MD;  Location: [facility] ENDOSCOPY;  Service: Pulmonary;  Laterality: N/A;  ? VIDEO BRONCHOSCOPY  WITH ENDOBRONCHIAL NAVIGATION Right 03/07/2021  ? Procedure: VIDEO BRONCHOSCOPY WITH ENDOBRONCHIAL NAVIGATION;  Surgeon: Collene Gobble, MD;  Location: [facility] ENDOSCOPY;  Service: Pulmonary;  Laterality: Right;  ? ? ?Family History  ?Problem Relation Age of Onset  ? Diabetes Other   ? Stroke Other   ? ?Social History:  reports that he quit smoking about 19 years ago. His smoking use included cigarettes. He has  never used smokeless tobacco. He reports current alcohol use of about 3.0 standard drinks per week. He reports that he does not use drugs. ? ?Allergies: No Known Allergies ? ?Medications Prior to Admission  ?Medication Sig Dispense Refill  ? allopurinol (ZYLOPRIM) 300 MG tablet Take 1 tablet by mouth once daily 90 tablet 0  ? amLODipine (NORVASC) 5 MG tablet Take 1 tablet by mouth once daily 90 tablet 0  ? enoxaparin (LOVENOX) 120 MG/0.8ML injection Inject 0.8 mL (120 mg) daily under the skin as directed by anticoagulation clinic. 5.6 mL 0  ? Ferrous Sulfate (IRON PO) Take 65 mg by mouth daily.    ? furosemide (LASIX) 40 MG tablet Take 1 tablet by mouth once daily 90 tablet 1  ? Ibuprofen 200 MG CAPS Take 400 mg by mouth daily.    ? potassium chloride SA (KLOR-CON) 20 MEQ tablet Take 1 tablet (20 mEq total) by mouth daily. 90 tablet 3  ? predniSONE (DELTASONE) 20 MG tablet TAKE 1/2 (ONE-HALF) TABLET BY MOUTH ONCE DAILY WITH BREAKFAST (Patient taking differently: Take 10 mg by mouth daily with breakfast.) 45 tablet 0  ? tamsulosin (FLOMAX) 0.4 MG CAPS capsule Take 1 capsule (0.4 mg total) by mouth daily after breakfast. 90 capsule 0  ? traMADol (ULTRAM) 50 MG tablet TAKE 1 TABLET BY MOUTH TWICE DAILY FOR  CHRONIC  PAIN (Patient taking differently: Take 50 mg by mouth 2 (two) times daily.) 60 tablet 0  ? traZODone (DESYREL) 50 MG tablet TAKE 1 TABLET BY MOUTH AT BEDTIME AS NEEDED 90 tablet 0  ? triamcinolone cream (KENALOG) 0.1 % Apply 1 application topically 2 (two) times daily as needed (itching). Around the nose    ? VITAMIN D PO Take 1 tablet by mouth daily.    ? warfarin (COUMADIN) 5 MG tablet Take 1 tablet (5 mg total) by mouth one time only at 4 PM for 1 dose. 5 mg every Mon; 2.5 mg all other days. Take as directed by anticoagulation clinic (Patient taking differently: Take 5 mg by mouth See admin instructions. 5 mg every Mon; 2.5 mg all other days. Take as directed by anticoagulation clinic) 75 tablet 0   ? ? ?No results found for this or any previous visit (from the past 48 hour(s)). ?No results found. ? ?Review of Systems ? ?Blood pressure (!) 157/47, pulse 92, temperature 97.7 ?F (36.5 ?C), temperature source Oral, resp. rate 17, height 5' 5.5" (1.664 m), weight 74.8 kg, SpO2 96 %. ?Physical Exam  ?Gen: Pleasant, well-nourished, in no distress,  normal affect ? ?ENT: No lesions,  mouth clear,  oropharynx clear, no postnasal drip ? ?Neck: No JVD, no stridor ? ?Lungs: No use of accessory muscles, no crackles or wheezing on normal respiration, no wheeze on forced expiration ? ?Cardiovascular: RRR, distant.  No murmur or click heard ? ?Abdomen: soft and NT, no HSM,  BS normal ? ?Musculoskeletal: No deformities, no cyanosis or clubbing ? ?Neuro: alert, awake, non focal ? ?Skin: Warm, no lesions or rashes ? ? ? ?Assessment/Plan ?Hemoptysis, intermittent on anticoagulation.  Etiology  unclear.  He has a persistent right lower lobe nodular opacity that actually looks a bit smaller than on his original imaging but is still present.  Also a left upper lobe groundglass opacity seen on his CT chest 10/07/2021.  We will plan for navigational bronchoscopy to the right lower lobe nodular opacity to repeat biopsies.  Also obtain culture data.  Try to localize his source of bleeding.  He understands the procedure, plans, risks, benefits and agrees to proceed. ? ?Collene Gobble, MD ?11/14/2021, 7:28 AM ? ? ? ?

## 2021-11-14 NOTE — Discharge Instructions (Signed)
Flexible Bronchoscopy, Care After ?This sheet gives you information about how to care for yourself after your test. Your doctor may also give you more specific instructions. If you have problems or questions, contact your doctor. ?Follow these instructions at home: ?Eating and drinking ?Do not eat or drink anything (not even water) for 2 hours after your test, or until your numbing medicine (local anesthetic) wears off. ?When your numbness is gone and your cough and gag reflexes have come back, you may: ?Eat only soft foods. ?Slowly drink liquids. ?The day after the test, go back to your normal diet. ?Driving ?Do not drive for 24 hours if you were given a medicine to help you relax (sedative). ?Do not drive or use heavy machinery while taking prescription pain medicine. ?General instructions ? ?Take over-the-counter and prescription medicines only as told by your doctor. ?Return to your normal activities as told. Ask what activities are safe for you. ?Do not use any products that have nicotine or tobacco in them. This includes cigarettes and e-cigarettes. If you need help quitting, ask your doctor. ?Keep all follow-up visits as told by your doctor. This is important. It is very important if you had a tissue sample (biopsy) taken. ?Get help right away if: ?You have shortness of breath that gets worse. ?You get light-headed. ?You feel like you are going to pass out (faint). ?You have chest pain. ?You cough up: ?More than a little blood. ?More blood than before. ?Summary ?Do not eat or drink anything (not even water) for 2 hours after your test, or until your numbing medicine wears off. ?Do not use cigarettes. Do not use e-cigarettes. ?Get help right away if you have chest pain. ? ?Please call our office for any questions or concerns.  [phone]. ? ?Reinitiate your anticoagulation as instructed by the cardiology anticoagulation clinic. ? ?This information is not intended to replace advice given to you by your health  care provider. Make sure you discuss any questions you have with your health care provider. ?Document Released: 06/11/2009 Document Revised: 07/27/2017 Document Reviewed: 09/01/2016 ?Elsevier Patient Education ? [place]. ? ?

## 2021-11-14 NOTE — Transfer of Care (Signed)
Immediate Anesthesia Transfer of Care Note ? ?Patient: [name] ? ?Procedure(s) Performed: ROBOTIC ASSISTED NAVIGATIONAL BRONCHOSCOPY (Right) ?BRONCHIAL BIOPSIES ?BRONCHIAL BRUSHINGS ? ?Patient Location: PACU ? ?Anesthesia Type:General ? ?Level of Consciousness: awake, alert  and oriented ? ?Airway & Oxygen Therapy: Patient Spontanous Breathing ? ?Post-op Assessment: Report given to RN and Post -op Vital signs reviewed and stable ? ?Post vital signs: Reviewed and stable ? ?Last Vitals:  ?Vitals Value Taken Time  ?BP 140/71 11/14/21 0846  ?Temp    ?Pulse 72 11/14/21 0847  ?Resp 23 11/14/21 0847  ?SpO2 93 % 11/14/21 0847  ?Vitals shown include unvalidated device data. ? ?Last Pain:  ?Vitals:  ? 11/14/21 0640  ?TempSrc:   ?PainSc: 0-No pain  ?   ? ?Patients Stated Pain Goal: 0 (11/14/21 0640) ? ?Complications: No notable events documented. ?

## 2021-11-14 NOTE — Anesthesia Postprocedure Evaluation (Signed)
Anesthesia Post Note ? ?Patient: DOLLIE MAYSE ? ?Procedure(s) Performed: ROBOTIC ASSISTED NAVIGATIONAL BRONCHOSCOPY (Right) ?BRONCHIAL BIOPSIES ?BRONCHIAL BRUSHINGS ? ?  ? ?Patient location during evaluation: PACU ?Anesthesia Type: General ?Level of consciousness: awake and alert, oriented and patient cooperative ?Pain management: pain level controlled ?Vital Signs Assessment: post-procedure vital signs reviewed and stable ?Respiratory status: spontaneous breathing, nonlabored ventilation and respiratory function stable ?Cardiovascular status: blood pressure returned to baseline and stable ?Postop Assessment: no apparent nausea or vomiting ?Anesthetic complications: no ? ? ?No notable events documented. ? ?Last Vitals:  ?Vitals:  ? 11/14/21 0900 11/14/21 0915  ?BP: (!) 142/64 134/65  ?Pulse: 69 71  ?Resp: 20 19  ?Temp:    ?SpO2: 92% 95%  ?  ?Last Pain:  ?Vitals:  ? 11/14/21 0915  ?TempSrc:   ?PainSc: 0-No pain  ? ? ?  ?  ?  ?  ?  ?  ? ?Jarome Matin Larina Lieurance ? ? ? ? ?

## 2021-11-15 ENCOUNTER — Encounter (HOSPITAL_COMMUNITY): Payer: Self-pay | Admitting: Emergency Medicine

## 2021-11-15 LAB — ACID FAST SMEAR (AFB, MYCOBACTERIA)
Acid Fast Smear: NEGATIVE
Acid Fast Smear: NEGATIVE

## 2021-11-15 LAB — CYTOLOGY - NON PAP

## 2021-11-16 LAB — ACID FAST SMEAR (AFB, MYCOBACTERIA): Acid Fast Smear: NEGATIVE

## 2021-11-16 LAB — CULTURE, RESPIRATORY W GRAM STAIN
Culture: NO GROWTH
Culture: NORMAL

## 2021-11-17 LAB — CULTURE, RESPIRATORY W GRAM STAIN: Culture: NORMAL

## 2021-11-19 LAB — AEROBIC/ANAEROBIC CULTURE W GRAM STAIN (SURGICAL/DEEP WOUND)
Culture: NORMAL
Culture: NORMAL
Culture: NO GROWTH
Gram Stain: NONE SEEN

## 2021-11-21 LAB — FUNGUS CULTURE W SMEAR
MICRO NUMBER:: 13061055
SMEAR:: NONE SEEN
SPECIMEN QUALITY:: ADEQUATE

## 2021-11-21 LAB — RESPIRATORY CULTURE OR RESPIRATORY AND SPUTUM CULTURE
MICRO NUMBER:: 13061056
RESULT:: NORMAL
SPECIMEN QUALITY:: ADEQUATE

## 2021-11-22 ENCOUNTER — Ambulatory Visit (INDEPENDENT_AMBULATORY_CARE_PROVIDER_SITE_OTHER): Payer: Medicare Other

## 2021-11-22 DIAGNOSIS — Z7901 Long term (current) use of anticoagulants: Secondary | ICD-10-CM

## 2021-11-22 LAB — POCT INR: INR: 2 (ref 2.0–3.0)

## 2021-11-22 NOTE — Progress Notes (Signed)
Patient ID: [name], male   DOB: [birth year], 86 y.o.   MRN: [id number] ? ?Medical screening examination/treatment/procedure(s) were performed by non-physician practitioner and as supervising physician I was immediately available for consultation/collaboration.  I agree with above. Cathlean Cower, MD ? ?

## 2021-11-22 NOTE — Progress Notes (Signed)
Continue 1/2 tablets daily except take 1 tablet on Mondays. Recheck in 6 weeks.  ?

## 2021-11-22 NOTE — Patient Instructions (Addendum)
Pre visit review using our clinic review tool, if applicable. No additional management support is needed unless otherwise documented below in the visit note. ? ?Continue 1/2 tablets daily except take 1 tablet on Mondays. Recheck in 6 weeks.  ?

## 2021-11-24 LAB — CYTOLOGY - NON PAP

## 2021-12-02 ENCOUNTER — Ambulatory Visit (INDEPENDENT_AMBULATORY_CARE_PROVIDER_SITE_OTHER): Payer: Medicare Other | Admitting: Pulmonary Disease

## 2021-12-02 ENCOUNTER — Other Ambulatory Visit (HOSPITAL_COMMUNITY): Payer: Self-pay

## 2021-12-02 ENCOUNTER — Encounter: Payer: Self-pay | Admitting: Pulmonary Disease

## 2021-12-02 VITALS — BP 150/78 | HR 93 | Temp 98.1°F | Ht 65.0 in | Wt 166.2 lb

## 2021-12-02 DIAGNOSIS — R042 Hemoptysis: Secondary | ICD-10-CM | POA: Diagnosis not present

## 2021-12-02 MED ORDER — FLUTICASONE FUROATE-VILANTEROL 200-25 MCG/ACT IN AEPB: 1.0000 | INHALATION_SPRAY | Freq: Every day | RESPIRATORY_TRACT | 3 refills | Status: DC

## 2021-12-02 MED ORDER — BREZTRI AEROSPHERE 160-9-4.8 MCG/ACT IN AERO: 2.0000 | INHALATION_SPRAY | Freq: Two times a day (BID) | RESPIRATORY_TRACT | 0 refills | Status: DC

## 2021-12-02 NOTE — Progress Notes (Signed)
? ?      ?[name]    [id number]    [birth year] ? ?Primary Care Physician:Hernandez Everardo Beals, MD ? ?Referring Physician: Isaac Bliss, Rayford Halsted, MD ?[place] ?[place],  [place] [address] ? ?Chief complaint:   ?Patient made an acute visit for hemoptysis ? ?HPI: ? ?Follows up with Dr. Erin Fulling ?Was recently seen by Dr. Lamonte Sakai for bronchoscopy ? ?He made the appointment partially because he was still coughing up blood and stated it was not clear about the results from recent bronchoscopy ? ?Discussed with Dr. Lamonte Sakai prior to his visit ? ?He continues to cough up some blood ?Does not have any acute symptoms, denies chest pain or chest discomfort ?No fever, no significant sputum production ? ?Former smoker, history of aortic valve replacement and atrial fibrillation on Coumadin ? ?Had navigational bronchoscopy August 2022-positive for non-small cell lung cancer for which he had SBRT to the right upper lobe ? ?His recent bronchoscopy did involve washings, needle aspiration, cytology brushings ?-Negative for malignancy ?-Cultures were negative ? ?He remains very active, no significant limitation with activities of daily living ?Outpatient Encounter Medications as of 12/02/2021  ?Medication Sig  ? allopurinol (ZYLOPRIM) 300 MG tablet Take 1 tablet by mouth once daily  ? amLODipine (NORVASC) 5 MG tablet Take 1 tablet by mouth once daily  ? Budeson-Glycopyrrol-Formoterol (BREZTRI AEROSPHERE) 160-9-4.8 MCG/ACT AERO Inhale 2 puffs into the lungs in the morning and at bedtime.  ? Ferrous Sulfate (IRON PO) Take 65 mg by mouth daily.  ? fluticasone furoate-vilanterol (BREO ELLIPTA) 200-25 MCG/ACT AEPB Inhale 1 puff into the lungs daily.  ? furosemide (LASIX) 40 MG tablet Take 1 tablet by mouth once daily  ? Ibuprofen 200 MG CAPS Take 400 mg by mouth daily.  ? potassium chloride SA (KLOR-CON M) 20 MEQ tablet Take 1 tablet by mouth once daily  ? predniSONE (DELTASONE) 20 MG tablet TAKE 1/2 (ONE-HALF) TABLET  BY MOUTH ONCE DAILY WITH BREAKFAST (Patient taking differently: Take 10 mg by mouth daily with breakfast.)  ? tamsulosin (FLOMAX) 0.4 MG CAPS capsule Take 1 capsule (0.4 mg total) by mouth daily after breakfast.  ? traMADol (ULTRAM) 50 MG tablet TAKE 1 TABLET BY MOUTH TWICE DAILY FOR  CHRONIC  PAIN  ? traZODone (DESYREL) 50 MG tablet TAKE 1 TABLET BY MOUTH AT BEDTIME AS NEEDED  ? triamcinolone cream (KENALOG) 0.1 % Apply 1 application topically 2 (two) times daily as needed (itching). Around the nose  ? VITAMIN D PO Take 1 tablet by mouth daily.  ? warfarin (COUMADIN) 5 MG tablet Take 1 tablet (5 mg total) by mouth See admin instructions for 1 dose. 5 mg every Mon; 2.5 mg all other days. Take as directed by anticoagulation clinic ? ?Please restart your Coumadin as directed by the anticoagulation clinic.  ? enoxaparin (LOVENOX) 120 MG/0.8ML injection Inject 0.8 mL (120 mg) daily under the skin as directed by anticoagulation clinic. (Patient not taking: Reported on 12/02/2021)  ? ?No facility-administered encounter medications on file as of 12/02/2021.  ? ? ?Allergies as of 12/02/2021  ? (No Known Allergies)  ? ? ?Past Medical History:  ?Diagnosis Date  ? Aortic stenosis   ? Atrial fibrillation ([place])   ? Baker cyst   ? Left knee  ? DJD (degenerative joint disease)   ? knees  ? Dysrhythmia   ? Eczema   ? Erectile dysfunction   ? Gout   ? History of hiatal hernia   ? History  of radiation therapy   ? Right lung- 04/21/21-05/03/21 Dr. Gery Pray  ? HTN (hypertension)   ? MVP (mitral valve prolapse)   ? Shortness of breath dyspnea   ? with activity  ? ? ?Past Surgical History:  ?Procedure Laterality Date  ?  polyps vocal cord  1994  ? AORTIC VALVE REPLACEMENT  ~2004  ? BRONCHIAL BIOPSY  03/07/2021  ? Procedure: BRONCHIAL BIOPSIES;  Surgeon: Collene Gobble, MD;  Location: [facility] ENDOSCOPY;  Service: Pulmonary;;  ? BRONCHIAL BIOPSY  11/14/2021  ? Procedure: BRONCHIAL BIOPSIES;  Surgeon: Collene Gobble, MD;  Location: [facility] ENDOSCOPY;   Service: Pulmonary;;  ? BRONCHIAL BRUSHINGS  03/07/2021  ? Procedure: BRONCHIAL BRUSHINGS;  Surgeon: Collene Gobble, MD;  Location: [facility] ENDOSCOPY;  Service: Pulmonary;;  ? BRONCHIAL BRUSHINGS  11/14/2021  ? Procedure: BRONCHIAL BRUSHINGS;  Surgeon: Collene Gobble, MD;  Location: [facility] ENDOSCOPY;  Service: Pulmonary;;  ? BRONCHIAL NEEDLE ASPIRATION BIOPSY  03/07/2021  ? Procedure: BRONCHIAL NEEDLE ASPIRATION BIOPSIES;  Surgeon: Collene Gobble, MD;  Location: [facility] ENDOSCOPY;  Service: Pulmonary;;  ? BRONCHIAL NEEDLE ASPIRATION BIOPSY  11/14/2021  ? Procedure: BRONCHIAL NEEDLE ASPIRATION BIOPSIES;  Surgeon: Collene Gobble, MD;  Location: [facility] ENDOSCOPY;  Service: Pulmonary;;  ? BRONCHIAL WASHINGS  09/21/2020  ? Procedure: BRONCHIAL WASHINGS;  Surgeon: Freddi Starr, MD;  Location: [facility] END[facility];  Service: Pulmonary;;  BAL ?  ? BRONCHIAL WASHINGS  03/07/2021  ? Procedure: BRONCHIAL WASHINGS;  Surgeon: Collene Gobble, MD;  Location: [facility] ENDOSCOPY;  Service: Pulmonary;;  ? BRONCHIAL WASHINGS  11/14/2021  ? Procedure: BRONCHIAL WASHINGS;  Surgeon: Collene Gobble, MD;  Location: [facility] ENDOSCOPY;  Service: Pulmonary;;  ? FIDUCIAL MARKER PLACEMENT  03/07/2021  ? Procedure: FIDUCIAL MARKER PLACEMENT;  Surgeon: Collene Gobble, MD;  Location: [facility] ENDOSCOPY;  Service: Pulmonary;;  ? HERNIA REPAIR  [phone]  ? HERNIA REPAIR  ~1983  ? LUMBAR LAMINECTOMY/DECOMPRESSION MICRODISCECTOMY N/A 01/15/2015  ? Procedure: 2 LEVEL DECOMPRESSIVE LUMBAR LAMINECTOMY L3-L4,L4-L5;  Surgeon: Latanya Maudlin, MD;  Location: [facility];  Service: Orthopedics;  Laterality: N/A;  ? LUMBAR LAMINECTOMY/DECOMPRESSION MICRODISCECTOMY N/A 05/01/2017  ? Procedure: Lumbar one-Lumbar two, Lumbar two-Lumbar three, Lumbar three-Lumbar four Laminectomy; Evacuation of hematoma;  Surgeon: Consuella Lose, MD;  Location: [place];  Service: Neurosurgery;  Laterality: N/A;  ? right total hip  ~4 years ago  ? ROTATOR CUFF REPAIR Left ~2009  ? TOTAL KNEE ARTHROPLASTY Left 04/14/2015  ?  Procedure: LEFT TOTAL KNEE ARTHROPLASTY;  Surgeon: Latanya Maudlin, MD;  Location: [facility];  Service: Orthopedics;  Laterality: Left;  ? VIDEO BRONCHOSCOPY N/A 09/21/2020  ? Procedure: VIDEO BRONCHOSCOPY WITHOUT FLUORO;  Surgeon: Freddi Starr, MD;  Location: [facility] ENDOSCOPY;  Service: Pulmonary;  Laterality: N/A;  ? VIDEO BRONCHOSCOPY WITH ENDOBRONCHIAL NAVIGATION Right 03/07/2021  ? Procedure: VIDEO BRONCHOSCOPY WITH ENDOBRONCHIAL NAVIGATION;  Surgeon: Collene Gobble, MD;  Location: [facility] ENDOSCOPY;  Service: Pulmonary;  Laterality: Right;  ? VIDEO BRONCHOSCOPY WITH RADIAL ENDOBRONCHIAL ULTRASOUND  11/14/2021  ? Procedure: VIDEO BRONCHOSCOPY WITH RADIAL ENDOBRONCHIAL ULTRASOUND;  Surgeon: Collene Gobble, MD;  Location: [facility] ENDOSCOPY;  Service: Pulmonary;;  ? ? ?Family History  ?Problem Relation Age of Onset  ? Diabetes Other   ? Stroke Other   ? ? ?Social History  ? ?Socioeconomic History  ? Marital status: Divorced  ?  Spouse name: Not on file  ? Number of children: Not on file  ? Years of education: Not on file  ? Highest education level: Not  on file  ?Occupational History  ? Not on file  ?Tobacco Use  ? Smoking status: Former  ?  Years: 30.00  ?  Types: Cigarettes  ?  Quit date: 08/28/2002  ?  Years since quitting: 19.2  ? Smokeless tobacco: Never  ?Vaping Use  ? Vaping Use: Never used  ?Substance and Sexual Activity  ? Alcohol use: Yes  ?  Alcohol/week: 3.0 standard drinks  ?  Types: 3 Shots of liquor per week  ?  Comment: social  ? Drug use: No  ? Sexual activity: Not on file  ?Other Topics Concern  ? Not on file  ?Social History Narrative  ? Not on file  ? ?Social Determinants of Health  ? ?Financial Resource Strain: Not on file  ?Food Insecurity: Not on file  ?Transportation Needs: Not on file  ?Physical Activity: Not on file  ?Stress: Not on file  ?Social Connections: Not on file  ?Intimate Partner Violence: Not on file  ? ? ?Review of Systems  ?Respiratory:  Positive for cough.   ? ?Vitals:  ? 12/02/21 0956   ?BP: (!) 150/78  ?Pulse: 93  ?Temp: 98.1 ?F (36.7 ?C)  ?SpO2: 96%  ? ? ? ?Physical Exam ?Constitutional:   ?   Appearance: Normal appearance.  ?HENT:  ?   Head: Normocephalic.  ?   Mouth/Throat:  ?   Mouth:

## 2021-12-02 NOTE — Patient Instructions (Signed)
An appointment will be made for you to see Dr. Erin Fulling in about 6 weeks ? ?Sample of [place] ? ?I did put in for a prescription for Breo it is 1 inhalation once daily ? ?Call with significant concerns ? ?I think the coughing up of blood is from multiple issues-being on blood thinner, mild pulmonary hypertension ? ?There is no cancer cells and no infection from recent bronchoscopy ?

## 2021-12-13 LAB — FUNGUS CULTURE WITH STAIN

## 2021-12-13 LAB — FUNGUS CULTURE RESULT

## 2021-12-13 LAB — FUNGAL ORGANISM REFLEX

## 2021-12-16 ENCOUNTER — Other Ambulatory Visit: Payer: Self-pay | Admitting: Internal Medicine

## 2021-12-16 DIAGNOSIS — G4700 Insomnia, unspecified: Secondary | ICD-10-CM

## 2021-12-16 DIAGNOSIS — Z7952 Long term (current) use of systemic steroids: Secondary | ICD-10-CM

## 2021-12-16 DIAGNOSIS — Z7901 Long term (current) use of anticoagulants: Secondary | ICD-10-CM

## 2021-12-19 LAB — FUNGUS CULTURE WITH STAIN

## 2021-12-19 LAB — FUNGUS CULTURE RESULT

## 2021-12-19 LAB — FUNGAL ORGANISM REFLEX

## 2021-12-27 LAB — ACID FAST CULTURE WITH REFLEXED SENSITIVITIES (MYCOBACTERIA)
Acid Fast Culture: NEGATIVE
Acid Fast Culture: NEGATIVE

## 2021-12-30 ENCOUNTER — Other Ambulatory Visit: Payer: Self-pay | Admitting: Internal Medicine

## 2022-01-01 LAB — ACID FAST CULTURE WITH REFLEXED SENSITIVITIES (MYCOBACTERIA): Acid Fast Culture: NEGATIVE

## 2022-01-03 ENCOUNTER — Ambulatory Visit (INDEPENDENT_AMBULATORY_CARE_PROVIDER_SITE_OTHER): Payer: Medicare Other

## 2022-01-03 DIAGNOSIS — Z7901 Long term (current) use of anticoagulants: Secondary | ICD-10-CM | POA: Diagnosis not present

## 2022-01-03 LAB — POCT INR: INR: 2.3 (ref 2.0–3.0)

## 2022-01-03 NOTE — Progress Notes (Signed)
Continue 1/2 tablets daily except take 1 tablet on Mondays. Recheck in 6 weeks.  ?

## 2022-01-03 NOTE — Patient Instructions (Addendum)
Pre visit review using our clinic review tool, if applicable. No additional management support is needed unless otherwise documented below in the visit note. ? ?Continue 1/2 tablets daily except take 1 tablet on Mondays. Recheck in 6 weeks.  ?

## 2022-01-17 ENCOUNTER — Other Ambulatory Visit: Payer: Self-pay | Admitting: Internal Medicine

## 2022-02-14 ENCOUNTER — Ambulatory Visit (INDEPENDENT_AMBULATORY_CARE_PROVIDER_SITE_OTHER): Payer: Medicare Other

## 2022-02-14 ENCOUNTER — Telehealth: Payer: Self-pay | Admitting: Oncology

## 2022-02-14 DIAGNOSIS — Z7901 Long term (current) use of anticoagulants: Secondary | ICD-10-CM | POA: Diagnosis not present

## 2022-02-14 LAB — POCT INR: INR: 2.4 (ref 2.0–3.0)

## 2022-02-14 NOTE — Telephone Encounter (Signed)
[name] called and will be in [place] until the middle of September.  He would like to cancel his follow up with Dr. Sondra Come on 05/15/22 and will call to reschedule once he has returned to the [place].

## 2022-02-14 NOTE — Progress Notes (Signed)
Per cardiology, INR goal should be changed to 2.0-2.5.  Continue 1/2 tablets daily except take 1 tablet on Mondays. Recheck in 6 weeks.

## 2022-02-14 NOTE — Patient Instructions (Addendum)
Pre visit review using our clinic review tool, if applicable. No additional management support is needed unless otherwise documented below in the visit note.  Continue 1/2 tablets daily except take 1 tablet on Mondays. Recheck in 6 weeks.

## 2022-02-16 ENCOUNTER — Other Ambulatory Visit: Payer: Self-pay | Admitting: Internal Medicine

## 2022-02-16 ENCOUNTER — Other Ambulatory Visit: Payer: Self-pay | Admitting: Cardiology

## 2022-02-16 DIAGNOSIS — R609 Edema, unspecified: Secondary | ICD-10-CM

## 2022-03-15 ENCOUNTER — Ambulatory Visit (INDEPENDENT_AMBULATORY_CARE_PROVIDER_SITE_OTHER): Payer: Medicare Other

## 2022-03-15 ENCOUNTER — Telehealth: Payer: Self-pay

## 2022-03-15 ENCOUNTER — Ambulatory Visit (INDEPENDENT_AMBULATORY_CARE_PROVIDER_SITE_OTHER): Payer: Medicare Other | Admitting: Orthopedic Surgery

## 2022-03-15 DIAGNOSIS — G8929 Other chronic pain: Secondary | ICD-10-CM | POA: Diagnosis not present

## 2022-03-15 DIAGNOSIS — M25512 Pain in left shoulder: Secondary | ICD-10-CM

## 2022-03-15 DIAGNOSIS — M25561 Pain in right knee: Secondary | ICD-10-CM

## 2022-03-15 NOTE — Telephone Encounter (Signed)
Auth needed for right knee gel injection

## 2022-03-18 ENCOUNTER — Other Ambulatory Visit: Payer: Self-pay | Admitting: Internal Medicine

## 2022-03-18 ENCOUNTER — Encounter: Payer: Self-pay | Admitting: Orthopedic Surgery

## 2022-03-18 DIAGNOSIS — G4700 Insomnia, unspecified: Secondary | ICD-10-CM

## 2022-03-18 DIAGNOSIS — Z7952 Long term (current) use of systemic steroids: Secondary | ICD-10-CM

## 2022-03-18 NOTE — Progress Notes (Signed)
Office Visit Note   Patient: [name]           Date of Birth: [birth year]           MRN: [id number] Visit Date: 03/15/2022 Requested by: [name], Jared Halsted, MD [place],  [place] [address] PCP: [name], Jared Halsted, MD  Subjective: Chief Complaint  Patient presents with   Left Shoulder - Pain   Right Knee - Pain    HPI: [name] is an 86 year old patient with right knee pain as well as left shoulder pain.  He describes chronic pain in that right knee for many years.  He has had a left knee replacement.  Hurts him to weight-bear.  Describes difficulty ambulating.  He also reports left shoulder pain which is significant and severe.  Had prior surgery 14 years ago.  He is right-hand dominant.  Describes severe lack of functional range of motion and strength on that left-hand side.  He has to assist any range of motion on the left-hand side with the right.  Patient has had injections of cortisone in the knee and shoulder without relief.  He is on Coumadin.  He had to have Lovenox bridging for recent bronchoscopy.  He states he cannot live with the left shoulder the way it is.              ROS: All systems reviewed are negative as they relate to the chief complaint within the history of present illness.  Patient denies  fevers or chills.   Assessment & Plan: Visit Diagnoses:  1. Chronic pain of right knee   2. Chronic left shoulder pain     Plan: Impression is right knee end-stage arthritis with reasonable result with left knee replacement.  Elective surgery in [place] could be problematic in terms of risk management.  Gel injections pending for the right knee.  Regarding left shoulder he would like to have reverse shoulder replacement.  The risk and benefits of the procedure were discussed with [facility] including not limited to infection or vessel damage instability as well as potential for perioperative complications including stroke heart attack among  others.  Plan is think that CT scan as a first step towards reverse shoulder replacement.  Come back in in 3 weeks for gel injection into the right knee.  He would need to have cardiac risk ratification as well as a plan for perioperative anticoagulation  Follow-Up Instructions: No follow-ups on file.   Orders:  Orders Placed This Encounter  Procedures   XR KNEE 3 VIEW RIGHT   XR Shoulder Left   CT SHOULDER LEFT WO CONTRAST   No orders of the defined types were placed in this encounter.     Procedures: No procedures performed   Clinical Data: No additional findings.  Objective: Vital Signs: There were no vitals taken for this visit.  Physical Exam:   Constitutional: Patient appears well-developed HEENT:  Head: Normocephalic Eyes:EOM are normal Neck: Normal range of motion Cardiovascular: Normal rate Pulmonary/chest: Effort normal Neurologic: Patient is alert Skin: Skin is warm Psychiatric: Patient has normal mood and affect   Ortho Exam: Ortho exam demonstrates varus alignment right lower extremity with range of motion of 5 to approximately 100 degrees.  Extensor mechanism intact.  Ankle dorsiflexion intact.  Pedal pulses palpable.  No masses lymphadenopathy or skin changes noted in that right knee region.  Trace effusion in the right knee is present.  No groin pain with internal/external rotation of  the right leg.  Left shoulder demonstrates passive range of motion of 45/90/140.  Active range of motion is around 30 or 40 degrees of abduction and forward flexion.  Deltoid is functional.  Rotator cuff is weak to infraspinatus and subscap and supraspinatus testing.  Coarse grinding is present with internal/external rotation of the arm at 90 degrees of abduction.  Specialty Comments:  No specialty comments available.  Imaging: No results found.   PMFS History: Patient Active Problem List   Diagnosis Date Noted   Hemoptysis 03/07/2021   Pulmonary nodules 03/07/2021    Hypokalemia 09/24/2019   Vitamin D deficiency 09/24/2019   Vitamin B12 deficiency 09/24/2019   Chronic diastolic CHF (congestive heart failure) ([place]) 09/17/2018   Nontraumatic epidural hematoma ([place]) 05/01/2017   Routine general medical examination at a health care facility 08/17/2015   Dyspnea 06/10/2015   History of total knee arthroplasty 04/14/2015   Spinal stenosis, lumbar region, with neurogenic claudication 01/15/2015   Encounter for therapeutic drug monitoring 10/13/2013   S/P AVR (aortic valve replacement) 07/03/2013   Long term (current) use of anticoagulants 07/03/2013   Urticaria 11/27/2011   Dysphonia 11/07/2011   Anxiety 04/04/2011   CAROTID BRUIT 03/23/2009   Osteoarthritis 03/22/2009   FIBRILLATION, ATRIAL 03/16/2009   Aortic valve disorder 01/21/2008   ERECTILE DYSFUNCTION, 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 Gery Pray   HTN (hypertension)    MVP (mitral valve prolapse)    Shortness of breath dyspnea    with activity    Family History  Problem Relation Age of Onset   Diabetes Other    Stroke Other     Past Surgical History:  Procedure Laterality Date    polyps vocal cord  1994   AORTIC VALVE REPLACEMENT  ~2004   BRONCHIAL BIOPSY  03/07/2021   Procedure: BRONCHIAL BIOPSIES;  Surgeon: Jared Gobble, MD;  Location: [facility] ENDOSCOPY;  Service: Pulmonary;;   BRONCHIAL BIOPSY  11/14/2021   Procedure: BRONCHIAL BIOPSIES;  Surgeon: Jared Gobble, MD;  Location[facility] ENDOSCOPY;  Service: Pulmonary;;   BRONCHIAL BRUSHINGS  03/07/2021   Procedure: BRONCHIAL BRUSHINGS;  Surgeon: Jared Gobble, MD;  Location: [facility]  ENDOSCOPY;  Service: Pulmonary;;   BRONCHIAL BRUSHINGS  11/14/2021   Procedure: BRONCHIAL BRUSHINGS;  Surgeon: Jared Gobble, MD;  Location: [facility] ENDOSCOPY;  Service: Pulmonary;;   BRONCHIAL NEEDLE ASPIRATION BIOPSY  03/07/2021   Procedure: BRONCHIAL NEEDLE ASPIRATION BIOPSIES;  Surgeon: Jared Gobble, MD;  Location: [facility] ENDOSCOPY;  Service: Pulmonary;;   BRONCHIAL NEEDLE ASPIRATION BIOPSY  11/14/2021   Procedure: BRONCHIAL NEEDLE ASPIRATION BIOPSIES;  Surgeon: Jared Gobble, MD;  Location: [facility] ENDOSCOPY;  Service: Pulmonary;;   BRONCHIAL WASHINGS  09/21/2020   Procedure: BRONCHIAL WASHINGS;  Surgeon: Freddi Starr, MD;  Location: [facility] ENDOSCOPY;  Service: Pulmonary;;  BAL    BRONCHIAL WASHINGS  03/07/2021   Procedure: BRONCHIAL WASHINGS;  Surgeon: Jared Gobble, MD;  Location: [facility] ENDOSCOPY;  Service: Pulmonary;;   BRONCHIAL WASHINGS  11/14/2021   Procedure: BRONCHIAL WASHINGS;  Surgeon: Lamonte Sakai,  Rose Fillers, MD;  Location: [facility] ENDOSCOPY;  Service: Pulmonary;;   FIDUCIAL MARKER PLACEMENT  03/07/2021   Procedure: FIDUCIAL MARKER PLACEMENT;  Surgeon: Jared Gobble, MD;  Location: [facility] ENDOSCOPY;  Service: Pulmonary;;   HERNIA REPAIR  [phone]   HERNIA REPAIR  [phone]   LUMBAR LAMINECTOMY/DECOMPRESSION MICRODISCECTOMY N/A 01/15/2015   Procedure: 2 LEVEL DECOMPRESSIVE LUMBAR LAMINECTOMY L3-L4,L4-L5;  Surgeon: Latanya Maudlin, MD;  Location: [facility];  Service: Orthopedics;  Laterality: N/A;   LUMBAR LAMINECTOMY/DECOMPRESSION MICRODISCECTOMY N/A 05/01/2017   Procedure: Lumbar one-Lumbar two, Lumbar two-Lumbar three, Lumbar three-Lumbar four Laminectomy; Evacuation of hematoma;  Surgeon: Consuella Lose, MD;  Location: [place];  Service: Neurosurgery;  Laterality: N/A;   right total hip  ~4 years ago   [place] Left ~2009   TOTAL KNEE ARTHROPLASTY Left 04/14/2015   Procedure: LEFT TOTAL KNEE ARTHROPLASTY;  Surgeon: Latanya Maudlin, MD;  Location: [facility];  Service: Orthopedics;  Laterality: Left;   VIDEO  BRONCHOSCOPY N/A 09/21/2020   Procedure: VIDEO BRONCHOSCOPY WITHOUT FLUORO;  Surgeon: Freddi Starr, MD;  Location: [facility] ENDOSCOPY;  Service: Pulmonary;  Laterality: N/A;   VIDEO BRONCHOSCOPY WITH ENDOBRONCHIAL NAVIGATION Right 03/07/2021   Procedure: VIDEO BRONCHOSCOPY WITH ENDOBRONCHIAL NAVIGATION;  Surgeon: Jared Gobble, MD;  Location: [facility] ENDOSCOPY;  Service: Pulmonary;  Laterality: Right;   VIDEO BRONCHOSCOPY WITH RADIAL ENDOBRONCHIAL ULTRASOUND  11/14/2021   Procedure: VIDEO BRONCHOSCOPY WITH RADIAL ENDOBRONCHIAL ULTRASOUND;  Surgeon: Jared Gobble, MD;  Location: [facility] ENDOSCOPY;  Service: Pulmonary;;   Social History   Occupational History   Not on file  Tobacco Use   Smoking status: Former    Years: 30.00    Types: Cigarettes    Quit date: 08/28/2002    Years since quitting: 19.5   Smokeless tobacco: Never  Vaping Use   Vaping Use: Never used  Substance and Sexual Activity   Alcohol use: Yes    Alcohol/week: 3.0 standard drinks of alcohol    Types: 3 Shots of liquor per week    Comment: social   Drug use: No   Sexual activity: Not on file

## 2022-03-23 NOTE — Telephone Encounter (Signed)
Approved for Durolane-right knee Buy and bill Covered @ 80% $01 copay No precert required

## 2022-03-23 NOTE — Telephone Encounter (Signed)
Called and advised. Pt was scheduled

## 2022-03-28 ENCOUNTER — Telehealth: Payer: Self-pay

## 2022-03-28 ENCOUNTER — Ambulatory Visit (INDEPENDENT_AMBULATORY_CARE_PROVIDER_SITE_OTHER): Payer: Medicare Other

## 2022-03-28 DIAGNOSIS — Z7901 Long term (current) use of anticoagulants: Secondary | ICD-10-CM | POA: Diagnosis not present

## 2022-03-28 LAB — POCT INR: INR: 2 (ref 2.0–3.0)

## 2022-03-28 MED ORDER — WARFARIN SODIUM 5 MG PO TABS: ORAL_TABLET | ORAL | 1 refills | Status: DC

## 2022-03-28 NOTE — Addendum Note (Signed)
Addended by: Randall An on: 03/28/2022 10:25 AM   Modules accepted: Orders

## 2022-03-28 NOTE — Telephone Encounter (Signed)
Faxed refill request received from [facility]  Pt has not had a recent coumadin visit, therefore, request cannot be filled. Upon review of chart, pt was notified on 08/08/21 that visit was needed.

## 2022-03-28 NOTE — Patient Instructions (Addendum)
Pre visit review using our clinic review tool, if applicable. No additional management support is needed unless otherwise documented below in the visit note.  Continue 1/2 tablets daily except take 1 tablet on Mondays. Recheck in 6 weeks.

## 2022-03-28 NOTE — Telephone Encounter (Signed)
Pt has coumadin clinic apt today and last visit was 6 weeks ago as scheduled. Pt has not missed any coumadin clinic apts.  Last PCP apt for pt was 10/25/21 for CPE, which PCP notes to f/u with her in 1 year.  Pt is up to date on apts.   Sent in refill for warfarin.

## 2022-03-28 NOTE — Progress Notes (Signed)
Continue 1/2 tablets daily except take 1 tablet on Mondays. Recheck in 6 weeks.

## 2022-04-06 ENCOUNTER — Ambulatory Visit
Admission: RE | Admit: 2022-04-06 | Discharge: 2022-04-06 | Disposition: A | Discharge status: Home / Self Care | Payer: Medicare Other | Source: Ambulatory Visit | Attending: Orthopedic Surgery | Admitting: Orthopedic Surgery

## 2022-04-06 DIAGNOSIS — G8929 Other chronic pain: Secondary | ICD-10-CM

## 2022-04-06 DIAGNOSIS — M25512 Pain in left shoulder: Secondary | ICD-10-CM | POA: Diagnosis not present

## 2022-04-12 ENCOUNTER — Ambulatory Visit: Payer: Medicare Other | Admitting: Orthopedic Surgery

## 2022-04-12 ENCOUNTER — Ambulatory Visit (INDEPENDENT_AMBULATORY_CARE_PROVIDER_SITE_OTHER): Payer: Medicare Other

## 2022-04-12 DIAGNOSIS — M1711 Unilateral primary osteoarthritis, right knee: Secondary | ICD-10-CM | POA: Diagnosis not present

## 2022-04-12 DIAGNOSIS — M25511 Pain in right shoulder: Secondary | ICD-10-CM

## 2022-04-13 ENCOUNTER — Other Ambulatory Visit: Payer: Medicare Other

## 2022-04-17 ENCOUNTER — Other Ambulatory Visit: Payer: Self-pay | Admitting: Internal Medicine

## 2022-04-20 ENCOUNTER — Telehealth: Payer: Self-pay | Admitting: *Deleted

## 2022-04-20 NOTE — Telephone Encounter (Signed)
CALLED PATIENT TO INFORM OF CT FOR 05-13-22- ARRIVAL TIME- 1:30 PM @ [facility], NO RESTRICTIONS TO TEST, PATIENT TO RECEIVE RESULTS FROM DR. [place] ON 05-15-22 @ 3:30 PM, LVM FOR A RETURN CALL

## 2022-04-23 ENCOUNTER — Encounter: Payer: Self-pay | Admitting: Orthopedic Surgery

## 2022-04-23 DIAGNOSIS — M1711 Unilateral primary osteoarthritis, right knee: Secondary | ICD-10-CM

## 2022-04-23 MED ORDER — LIDOCAINE HCL 1 % IJ SOLN
5.0000 mL | INTRAMUSCULAR | Status: AC | PRN
  Administered 2022-04-23: 5 mL

## 2022-04-23 MED ORDER — SODIUM HYALURONATE 60 MG/3ML IX PRSY
60.0000 mg | PREFILLED_SYRINGE | INTRA_ARTICULAR | Status: AC | PRN
  Administered 2022-04-23: 60 mg via INTRA_ARTICULAR

## 2022-04-23 NOTE — Progress Notes (Signed)
Office Visit Note   Patient: [name]           Date of Birth: 11-15-1935           MRN: [id number] Visit Date: 04/12/2022 Requested by: [name], Jared Halsted, MD [place],  [place] [id number] PCP: [name], Jared Halsted, MD  Subjective: Chief Complaint  Patient presents with   Right Knee - Pain    HPI: [name] is an 86 year old patient with multiple medical problems with left shoulder pain.  He does have end-stage arthritis and rotator cuff arthropathy affecting the left shoulder.  He has had injections which have not helped.  Preop CT scan for possible RSA has been performed.  Does show adequate bone stock.  Having a lot of pain as well as diminished mobility with the left arm.  He does have reasonable function in the right arm.  Also is here for right knee Durolane injection.              ROS: All systems reviewed are negative as they relate to the chief complaint within the history of present illness.  Patient denies  fevers or chills.   Assessment & Plan: Visit Diagnoses:  1. Right shoulder pain, unspecified chronicity     Plan: Impression is right shoulder pain with arthritis but reasonable function up to around 90 degrees of forward flexion and abduction.  Left shoulder has diminished function and strength with forward flexion actively to about 40 or 50 degrees.  Right knee injected today with Durolane.  He asked about gel injection in the shoulders but that is not really feasible at this time per current policy.  We also discussed left reverse shoulder replacement in a patient with his medical comorbidities.  Reasonably moderate chance of postoperative complication in terms of stroke heart attack TIA or other event.  He is going to hold off on shoulder replacement for now.  Follow-up with further issues with either the knees or shoulders.  He is in a difficult situation due to his medical situation.  Follow-Up Instructions: Return if symptoms worsen  or fail to improve.   Orders:  Orders Placed This Encounter  Procedures   XR Shoulder Right   No orders of the defined types were placed in this encounter.     Procedures: Large Joint Inj: R knee on 04/23/2022 9:14 PM Indications: diagnostic evaluation, joint swelling and pain Details: 18 G 1.5 in needle, superolateral approach  Arthrogram: No  Medications: 5 mL lidocaine 1 %; 60 mg Sodium Hyaluronate 60 MG/3ML Outcome: tolerated well, no immediate complications Procedure, treatment alternatives, risks and benefits explained, specific risks discussed. Consent was given by the patient. Immediately prior to procedure a time out was called to verify the correct patient, procedure, equipment, support staff and site/side marked as required. Patient was prepped and draped in the usual sterile fashion.       Clinical Data: No additional findings.  Objective: Vital Signs: There were no vitals taken for this visit.  Physical Exam:   Constitutional: Patient appears well-developed HEENT:  Head: Normocephalic Eyes:EOM are normal Neck: Normal range of motion Cardiovascular: Normal rate Pulmonary/chest: Effort normal Neurologic: Patient is alert Skin: Skin is warm Psychiatric: Patient has normal mood and affect   Ortho Exam: Ortho exam demonstrates reasonable cervical spine range of motion.  On the right-hand side he has forward flexion and abduction actively to about 90 degrees.  On the left he can forward flex and AB duct  to about 45 degrees.  Deltoid fires bilaterally.  Does have some rotator cuff weakness and coarseness with passive range of motion in both shoulders.  Motor function and sensory function to the hands intact with palpable radial pulse bilaterally.  Right knee has flexion contracture less than 10 degrees with intact extensor mechanism and no groin pain on the right with internal/external rotation of the leg.  Specialty Comments:  No specialty comments  available.  Imaging: No results found.   PMFS History: Patient Active Problem List   Diagnosis Date Noted   Hemoptysis 03/07/2021   Pulmonary nodules 03/07/2021   Hypokalemia 09/24/2019   Vitamin D deficiency 09/24/2019   Vitamin B12 deficiency 09/24/2019   Chronic diastolic CHF (congestive heart failure) ([place]) 09/17/2018   Nontraumatic epidural hematoma ([place]) 05/01/2017   Routine general medical examination at a health care facility 08/17/2015   Dyspnea 06/10/2015   History of total knee arthroplasty 04/14/2015   Spinal stenosis, lumbar region, with neurogenic claudication 01/15/2015   Encounter for therapeutic drug monitoring 10/13/2013   S/P AVR (aortic valve replacement) 07/03/2013   Long term (current) use of anticoagulants 07/03/2013   Urticaria 11/27/2011   Dysphonia 11/07/2011   Anxiety 04/04/2011   CAROTID BRUIT 03/23/2009   Osteoarthritis 03/22/2009   FIBRILLATION, ATRIAL 03/16/2009   Aortic valve disorder 01/21/2008   ERECTILE DYSFUNCTION, 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 Gery Francis   HTN (hypertension)    MVP (mitral valve prolapse)    Shortness of breath dyspnea    with activity    Family History  Problem Relation Age of Onset   Diabetes Other    Stroke Other     Past Surgical History:  Procedure Laterality Date    polyps vocal cord  1994   AORTIC VALVE REPLACEMENT  ~2004   BRONCHIAL BIOPSY  03/07/2021   Procedure: BRONCHIAL BIOPSIES;  Surgeon: Jared Gobble, MD;  Location: [facility] ENDOSCOPY;  Service: Pulmonary;;   BRONCHIAL BIOPSY  11/14/2021   Procedure: BRONCHIAL BIOPSIES;  Surgeon: Jared Gobble, MD;  Location: [facility] ENDOSCOPY;  Service: Pulmonary;;   BRONCHIAL BRUSHINGS  03/07/2021   Procedure: BRONCHIAL BRUSHINGS;  Surgeon: Jared Gobble, MD;  Location: [facility] ENDOSCOPY;  Service: Pulmonary;;   BRONCHIAL BRUSHINGS  11/14/2021   Procedure: BRONCHIAL BRUSHINGS;  Surgeon: Jared Gobble, MD;  Location: [facility] ENDOSCOPY;  Service: Pulmonary;;   BRONCHIAL NEEDLE ASPIRATION BIOPSY  03/07/2021   Procedure: BRONCHIAL NEEDLE ASPIRATION BIOPSIES;  Surgeon: Jared Gobble, MD;  Location: [facility] ENDOSCOPY;  Service: Pulmonary;;   BRONCHIAL NEEDLE ASPIRATION BIOPSY  11/14/2021   Procedure: BRONCHIAL NEEDLE ASPIRATION BIOPSIES;  Surgeon: Jared Gobble, MD;  Location: [facility] ENDOSCOPY;  Service: Pulmonary;;   BRONCHIAL WASHINGS  09/21/2020   Procedure: BRONCHIAL WASHINGS;  Surgeon: Freddi Starr, MD;  Location: [facility] ENDOSCOPY;  Service: Pulmonary;;  BAL    BRONCHIAL WASHINGS  03/07/2021   Procedure: BRONCHIAL WASHINGS;  Surgeon: Jared Gobble, MD;  Location: [facility] ENDOSCOPY;  Service: Pulmonary;;   BRONCHIAL WASHINGS  11/14/2021  Procedure: BRONCHIAL WASHINGS;  Surgeon: Jared Gobble, MD;  Location: [place];  Service: Pulmonary;;   FIDUCIAL MARKER PLACEMENT  03/07/2021   Procedure: FIDUCIAL MARKER PLACEMENT;  Surgeon: Jared Gobble, MD;  Location: [facility] ENDOSCOPY;  Service: Pulmonary;;   HERNIA REPAIR  ~1978   HERNIA REPAIR  [phone]   LUMBAR LAMINECTOMY/DECOMPRESSION MICRODISCECTOMY N/A 01/15/2015   Procedure: 2 LEVEL DECOMPRESSIVE LUMBAR LAMINECTOMY L3-L4,L4-L5;  Surgeon: Latanya Maudlin, MD;  Location: [facility] ORS;  Service: Orthopedics;  Laterality: N/A;   LUMBAR LAMINECTOMY/DECOMPRESSION MICRODISCECTOMY N/A 05/01/2017   Procedure: Lumbar one-Lumbar two, Lumbar two-Lumbar three, Lumbar three-Lumbar four Laminectomy; Evacuation of hematoma;  Surgeon: Consuella Lose, MD;  Location: [place];  Service: Neurosurgery;  Laterality: N/A;   right total hip  ~4 years ago   [place] Left ~2009   TOTAL  KNEE ARTHROPLASTY Left 04/14/2015   Procedure: LEFT TOTAL KNEE ARTHROPLASTY;  Surgeon: Latanya Maudlin, MD;  Location: [facility] ORS;  Service: Orthopedics;  Laterality: Left;   VIDEO BRONCHOSCOPY N/A 09/21/2020   Procedure: VIDEO BRONCHOSCOPY WITHOUT FLUORO;  Surgeon: Freddi Starr, MD;  Location: [facility] ENDOSCOPY;  Service: Pulmonary;  Laterality: N/A;   VIDEO BRONCHOSCOPY WITH ENDOBRONCHIAL NAVIGATION Right 03/07/2021   Procedure: VIDEO BRONCHOSCOPY WITH ENDOBRONCHIAL NAVIGATION;  Surgeon: Jared Gobble, MD;  Location: [facility] ENDOSCOPY;  Service: Pulmonary;  Laterality: Right;   VIDEO BRONCHOSCOPY WITH RADIAL ENDOBRONCHIAL ULTRASOUND  11/14/2021   Procedure: VIDEO BRONCHOSCOPY WITH RADIAL ENDOBRONCHIAL ULTRASOUND;  Surgeon: Jared Gobble, MD;  Location: [facility] ENDOSCOPY;  Service: Pulmonary;;   Social History   Occupational History   Not on file  Tobacco Use   Smoking status: Former    Years: 30.00    Types: Cigarettes    Quit date: 08/28/2002    Years since quitting: 19.6   Smokeless tobacco: Never  Vaping Use   Vaping Use: Never used  Substance and Sexual Activity   Alcohol use: Yes    Alcohol/week: 3.0 standard drinks of alcohol    Types: 3 Shots of liquor per week    Comment: social   Drug use: No   Sexual activity: Not on file

## 2022-05-12 ENCOUNTER — Ambulatory Visit (INDEPENDENT_AMBULATORY_CARE_PROVIDER_SITE_OTHER): Payer: Medicare Other

## 2022-05-12 DIAGNOSIS — Z7901 Long term (current) use of anticoagulants: Secondary | ICD-10-CM

## 2022-05-12 LAB — POCT INR: INR: 2.2 (ref 2.0–3.0)

## 2022-05-12 NOTE — Patient Instructions (Addendum)
Pre visit review using our clinic review tool, if applicable. No additional management support is needed unless otherwise documented below in the visit note.   Continue 1/2 tablets daily except take 1 tablet on Mondays. Recheck in 6 weeks.

## 2022-05-12 NOTE — Progress Notes (Signed)
Continue 1/2 tablets daily except take 1 tablet on Mondays. Recheck in 6 weeks.

## 2022-05-13 ENCOUNTER — Ambulatory Visit (HOSPITAL_COMMUNITY): Payer: Medicare Other

## 2022-05-15 ENCOUNTER — Ambulatory Visit: Payer: Self-pay | Admitting: Radiation Oncology

## 2022-05-15 ENCOUNTER — Ambulatory Visit: Payer: Medicare Other | Admitting: Radiation Oncology

## 2022-05-17 ENCOUNTER — Other Ambulatory Visit: Payer: Self-pay

## 2022-05-17 ENCOUNTER — Other Ambulatory Visit: Payer: Self-pay | Admitting: Internal Medicine

## 2022-05-19 ENCOUNTER — Other Ambulatory Visit: Payer: Self-pay | Admitting: Internal Medicine

## 2022-05-22 ENCOUNTER — Other Ambulatory Visit: Payer: Self-pay | Admitting: Internal Medicine

## 2022-05-22 DIAGNOSIS — G4700 Insomnia, unspecified: Secondary | ICD-10-CM

## 2022-05-22 NOTE — Telephone Encounter (Signed)
Requesting refill of  traZODone (DESYREL) 50 MG tablet

## 2022-05-23 MED ORDER — TRAZODONE HCL 50 MG PO TABS: 50.0000 mg | ORAL_TABLET | Freq: Every evening | ORAL | 0 refills | Status: DC | PRN

## 2022-05-24 NOTE — Progress Notes (Signed)
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 Preoperative cardiac catheterization revealed normal coronary arteries. He has permanent atrial fibrillation. Previous carotid Dopplers due to bruits revealed 0-39% bilateral stenosis. Admitted 9/18 with back pain for planned laminectomy. However he was found to have spontaneous epidural hematoma which was evacuated. Discussed with Dr Kathyrn Sheriff and he felt anticoagulation could be carefully resumed. Anticoagulation resumed at previous ov.  Monitor April 2019 showed atrial fibrillation with multiple pauses greater than 3 seconds all occurring late p.m. or early a.m. hours. We asked patient to be seen by electrophysiology following results of previous event monitor for consideration of pacemaker. Patient declined. Echocardiogram repeated May 2022 and showed ejection fraction 70 to 75%, moderate biatrial enlargement, mild mitral regurgitation, previous aortic valve replacement with no aortic stenosis and mild aortic insufficiency.  Since I last saw him, patient complains of increased dyspnea on exertion for several weeks.  No orthopnea, PND, pedal edema, chest pain or syncope.  No productive cough or hemoptysis.  Current Outpatient Medications  Medication Sig Dispense Refill   allopurinol (ZYLOPRIM) 300 MG tablet Take 1 tablet by mouth once daily 90 tablet 0   amLODipine (NORVASC) 5 MG tablet Take 1 tablet by mouth once daily 90 tablet 0   Ferrous Sulfate (IRON PO) Take 65 mg by mouth daily.     furosemide (LASIX) 40 MG tablet Take 1 tablet by mouth once daily 90 tablet 1   Ibuprofen 200 MG CAPS Take 400 mg by mouth daily.     potassium chloride SA (KLOR-CON M) 20 MEQ tablet Take 1 tablet by mouth once daily 90 tablet 3   predniSONE (DELTASONE) 20 MG tablet TAKE 1/2 (ONE-HALF) TABLET BY MOUTH ONCE DAILY WITH BREAKFAST 45 tablet 0   tamsulosin (FLOMAX) 0.4 MG  CAPS capsule Take 1 capsule (0.4 mg total) by mouth daily after breakfast. 90 capsule 1   traMADol (ULTRAM) 50 MG tablet TAKE 1 TABLET BY MOUTH TWICE DAILY FOR  CHRONIC  PAIN 60 tablet 0   traZODone (DESYREL) 50 MG tablet Take 1 tablet (50 mg total) by mouth at bedtime as needed. 90 tablet 0   triamcinolone cream (KENALOG) 0.1 % APPLY  CREAM EXTERNALLY TO AFFECTED AREA TWICE DAILY 80 g 0   VITAMIN D PO Take 1 tablet by mouth daily.     warfarin (COUMADIN) 5 MG tablet TAKE 1/2 TABLET BY MOUTH  DAILY EXCEPT TAKE 1 TABLET ON MONDAYS OR AS  DIRECTED  BY  ANTICOAGULATION  CLINIC 75 tablet 1   Budeson-Glycopyrrol-Formoterol (BREZTRI AEROSPHERE) 160-9-4.8 MCG/ACT AERO Inhale 2 puffs into the lungs in the morning and at bedtime. (Patient not taking: Reported on 06/07/2022) 10.7 g 0   enoxaparin (LOVENOX) 120 MG/0.8ML injection Inject 0.8 mL (120 mg) daily under the skin as directed by anticoagulation clinic. (Patient not taking: Reported on 12/02/2021) 5.6 mL 0   fluticasone furoate-vilanterol (BREO ELLIPTA) 200-25 MCG/ACT AEPB Inhale 1 puff into the lungs daily. (Patient not taking: Reported on 06/07/2022) 1 each 3   No current facility-administered medications for this visit.     Past Medical History:  Diagnosis Date   Aortic stenosis    Atrial fibrillation (HCC)    Baker cyst    Left knee   DJD (degenerative joint disease)    knees   Dysrhythmia    Eczema    Erectile dysfunction    Gout    History  of hiatal hernia    History of radiation therapy    Right lung- 04/21/21-05/03/21 Dr. Gery Pray   HTN (hypertension)    MVP (mitral valve prolapse)    Shortness of breath dyspnea    with activity    Past Surgical History:  Procedure Laterality Date    polyps vocal cord  1994   AORTIC VALVE REPLACEMENT  ~2004   BRONCHIAL BIOPSY  03/07/2021   Procedure: BRONCHIAL BIOPSIES;  Surgeon: Collene Gobble, MD;  Location: [facility] ENDOSCOPY;  Service: Pulmonary;;   BRONCHIAL BIOPSY  11/14/2021    Procedure: BRONCHIAL BIOPSIES;  Surgeon: Collene Gobble, MD;  Location: [facility] ENDOSCOPY;  Service: Pulmonary;;   BRONCHIAL BRUSHINGS  03/07/2021   Procedure: BRONCHIAL BRUSHINGS;  Surgeon: Collene Gobble, MD;  Location: [facility] ENDOSCOPY;  Service: Pulmonary;;   BRONCHIAL BRUSHINGS  11/14/2021   Procedure: BRONCHIAL BRUSHINGS;  Surgeon: Collene Gobble, MD;  Location: [facility] ENDOSCOPY;  Service: Pulmonary;;   BRONCHIAL NEEDLE ASPIRATION BIOPSY  03/07/2021   Procedure: BRONCHIAL NEEDLE ASPIRATION BIOPSIES;  Surgeon: Collene Gobble, MD;  Location: [facility] ENDOSCOPY;  Service: Pulmonary;;   BRONCHIAL NEEDLE ASPIRATION BIOPSY  11/14/2021   Procedure: BRONCHIAL NEEDLE ASPIRATION BIOPSIES;  Surgeon: Collene Gobble, MD;  Location: [facility] ENDOSCOPY;  Service: Pulmonary;;   BRONCHIAL WASHINGS  09/21/2020   Procedure: BRONCHIAL WASHINGS;  Surgeon: Freddi Starr, MD;  Location: [facility] ENDOSCOPY;  Service: Pulmonary;;  BAL    BRONCHIAL WASHINGS  03/07/2021   Procedure: BRONCHIAL WASHINGS;  Surgeon: Collene Gobble, MD;  Location: [facility] ENDOSCOPY;  Service: Pulmonary;;   BRONCHIAL WASHINGS  11/14/2021   Procedure: BRONCHIAL WASHINGS;  Surgeon: Collene Gobble, MD;  Location: [place];  Service: Pulmonary;;   FIDUCIAL MARKER PLACEMENT  03/07/2021   Procedure: FIDUCIAL MARKER PLACEMENT;  Surgeon: Collene Gobble, MD;  Location: [facility] ENDOSCOPY;  Service: Pulmonary;;   HERNIA REPAIR  ~1978   HERNIA REPAIR  ~1983   LUMBAR LAMINECTOMY/DECOMPRESSION MICRODISCECTOMY N/A 01/15/2015   Procedure: 2 LEVEL DECOMPRESSIVE LUMBAR LAMINECTOMY L3-L4,L4-L5;  Surgeon: Latanya Maudlin, MD;  Location: [facility];  Service: Orthopedics;  Laterality: N/A;   LUMBAR LAMINECTOMY/DECOMPRESSION MICRODISCECTOMY N/A 05/01/2017   Procedure: Lumbar one-Lumbar two, Lumbar two-Lumbar three, Lumbar three-Lumbar four Laminectomy; Evacuation of hematoma;  Surgeon: Consuella Lose, MD;  Location: [place];  Service: Neurosurgery;  Laterality: N/A;   right total hip  ~4 years  ago   [place] Left ~2009   TOTAL KNEE ARTHROPLASTY Left 04/14/2015   Procedure: LEFT TOTAL KNEE ARTHROPLASTY;  Surgeon: Latanya Maudlin, MD;  Location: [facility];  Service: Orthopedics;  Laterality: Left;   VIDEO BRONCHOSCOPY N/A 09/21/2020   Procedure: VIDEO BRONCHOSCOPY WITHOUT FLUORO;  Surgeon: Freddi Starr, MD;  Location: [facility] ENDOSCOPY;  Service: Pulmonary;  Laterality: N/A;   VIDEO BRONCHOSCOPY WITH ENDOBRONCHIAL NAVIGATION Right 03/07/2021   Procedure: VIDEO BRONCHOSCOPY WITH ENDOBRONCHIAL NAVIGATION;  Surgeon: Collene Gobble, MD;  Location: [facility] ENDOSCOPY;  Service: Pulmonary;  Laterality: Right;   VIDEO BRONCHOSCOPY WITH RADIAL ENDOBRONCHIAL ULTRASOUND  11/14/2021   Procedure: VIDEO BRONCHOSCOPY WITH RADIAL ENDOBRONCHIAL ULTRASOUND;  Surgeon: Collene Gobble, MD;  Location: [facility] ENDOSCOPY;  Service: Pulmonary;;    Social History   Socioeconomic History   Marital status: Divorced    Spouse name: Not on file   Number of children: Not on file   Years of education: Not on file   Highest education level: Not on file  Occupational History   Not on file  Tobacco Use  Smoking status: Former    Years: 30.00    Types: Cigarettes    Quit date: 08/28/2002    Years since quitting: 19.7   Smokeless tobacco: Never  Vaping Use   Vaping Use: Never used  Substance and Sexual Activity   Alcohol use: Yes    Alcohol/week: 3.0 standard drinks of alcohol    Types: 3 Shots of liquor per week    Comment: social   Drug use: No   Sexual activity: Not on file  Other Topics Concern   Not on file  Social History Narrative   Not on file   Social Determinants of Health   Financial Resource Strain: Not on file  Food Insecurity: Not on file  Transportation Needs: Not on file  Physical Activity: Not on file  Stress: Not on file  Social Connections: Not on file  Intimate Partner Violence: Not on file    Family History  Problem Relation Age of Onset   Diabetes Other    Stroke Other      ROS: no fevers or chills, productive cough, hemoptysis, dysphasia, odynophagia, melena, hematochezia, dysuria, hematuria, rash, seizure activity, orthopnea, PND, pedal edema, claudication. Remaining systems are negative.  Physical Exam: Well-developed well-nourished in no acute distress.  Skin is warm and dry.  HEENT is normal.  Neck is supple.  Chest diminished breath sounds bases Cardiovascular exam is irregular, 2/6 systolic murmur left sternal border.  No diastolic murmur. Abdominal exam nontender or distended. No masses palpated. Extremities show no edema. neuro grossly intact  ECG-atrial fibrillation at a rate of 85, right bundle branch block.  Personally reviewed  A/P  1 permanent atrial fibrillation-patient's heart rate remains controlled on no medications.  Continue Coumadin with goal INR 2-3.  2 prior aortic valve replacement-continue SBE prophylaxis.  3 hypertension-blood pressure controlled.  Continue present medical regimen.  4 chronic diastolic congestive heart failure-he is euvolemic on exam.  Continue diuretic at present dose.  Check potassium and renal function.  5 bradycardia-he previously declined EP evaluation despite previous pauses noted.  He has not had syncope.  Continue to follow.  6 lung cancer-managed by oncology.  7 dyspnea-etiology unclear.  He is not volume overloaded on examination.  We will check BNP.  Most recent echocardiogram showed preserved LV function and normally functioning aortic valve.  Question related to lung disease.  We will repeat PA and lateral chest x-ray.  May need follow-up with pulmonary pending results.  We will also check hemoglobin.  Kirk Ruths, MD

## 2022-05-24 NOTE — Telephone Encounter (Signed)
Refill sent.

## 2022-05-25 ENCOUNTER — Other Ambulatory Visit: Payer: Self-pay | Admitting: Internal Medicine

## 2022-05-25 NOTE — Telephone Encounter (Signed)
Last refill was "print"

## 2022-05-25 NOTE — Telephone Encounter (Signed)
Pt called to say [employer] is telling him they still have not received anything from MD for the refill of the:  traMADol (ULTRAM) 50 MG tablet  Pt stated he is in a lot of pain and really needs this Rx refilled.  Rx shows a start date of:  05/17/22 (60 tablets)   Pharmacy  [place], [place]   Please advise.

## 2022-05-26 ENCOUNTER — Telehealth: Payer: Self-pay | Admitting: Internal Medicine

## 2022-05-26 NOTE — Telephone Encounter (Signed)
[facility] pharm tech is calling the rx  traMADol (ULTRAM) 50 MG tablet was printed on 05-17-2022 and not sent to pharm. Please send

## 2022-05-29 MED ORDER — TRAMADOL HCL 50 MG PO TABS: ORAL_TABLET | ORAL | 0 refills | Status: DC

## 2022-06-07 ENCOUNTER — Ambulatory Visit: Payer: Medicare Other | Attending: Cardiology | Admitting: Cardiology

## 2022-06-07 ENCOUNTER — Ambulatory Visit
Admission: RE | Admit: 2022-06-07 | Discharge: 2022-06-07 | Disposition: A | Discharge status: Home / Self Care | Payer: Medicare Other | Source: Ambulatory Visit | Attending: Cardiology | Admitting: Cardiology

## 2022-06-07 ENCOUNTER — Encounter: Payer: Self-pay | Admitting: Cardiology

## 2022-06-07 VITALS — BP 128/60 | HR 85 | Ht 66.0 in | Wt 165.8 lb

## 2022-06-07 DIAGNOSIS — I10 Essential (primary) hypertension: Secondary | ICD-10-CM | POA: Diagnosis not present

## 2022-06-07 DIAGNOSIS — R0602 Shortness of breath: Secondary | ICD-10-CM

## 2022-06-07 DIAGNOSIS — I4821 Permanent atrial fibrillation: Secondary | ICD-10-CM | POA: Diagnosis not present

## 2022-06-07 DIAGNOSIS — I5032 Chronic diastolic (congestive) heart failure: Secondary | ICD-10-CM | POA: Diagnosis not present

## 2022-06-07 DIAGNOSIS — Z952 Presence of prosthetic heart valve: Secondary | ICD-10-CM | POA: Diagnosis not present

## 2022-06-07 NOTE — Patient Instructions (Signed)
  Testing/Procedures:  A chest x-ray takes a picture of the organs and structures inside the chest, including the heart, lungs, and blood vessels. This test can show several things, including, whether the heart is enlarges; whether fluid is building up in the lungs; and whether pacemaker / defibrillator leads are still in place.[place]: At [facility], you and your health needs are our priority.  As part of our continuing mission to provide you with exceptional heart care, we have created designated Provider Care Teams.  These Care Teams include your primary Cardiologist (physician) and Advanced Practice Providers (APPs -  Physician Assistants and Nurse Practitioners) who all work together to provide you with the care you need, when you need it.  We recommend signing up for the patient portal called "MyChart".  Sign up information is provided on this After Visit Summary.  MyChart is used to connect with patients for Virtual Visits (Telemedicine).  Patients are able to view lab/test results, encounter notes, upcoming appointments, etc.  Non-urgent messages can be sent to your provider as well.   To learn more about what you can do with MyChart, go to [email or web address].    Your next appointment:   6 month(s)  The format for your next appointment:   In Person  Provider:   Kirk Ruths, MD

## 2022-06-08 ENCOUNTER — Other Ambulatory Visit: Payer: Self-pay | Admitting: *Deleted

## 2022-06-08 ENCOUNTER — Telehealth: Payer: Self-pay | Admitting: Pulmonary Disease

## 2022-06-08 DIAGNOSIS — I5032 Chronic diastolic (congestive) heart failure: Secondary | ICD-10-CM

## 2022-06-08 LAB — BASIC METABOLIC PANEL
BUN/Creatinine Ratio: 16 (ref 10–24)
BUN: 18 mg/dL (ref 8–27)
CO2: 27 mmol/L (ref 20–29)
Calcium: 8.8 mg/dL (ref 8.6–10.2)
Chloride: 103 mmol/L (ref 96–106)
Creatinine, Ser: 1.12 mg/dL (ref 0.76–1.27)
Glucose: 90 mg/dL (ref 70–99)
Potassium: 3.7 mmol/L (ref 3.5–5.2)
Sodium: 141 mmol/L (ref 134–144)
eGFR: 65 mL/min/{1.73_m2} (ref >59)

## 2022-06-08 LAB — CBC
Hematocrit: 37.2 % — ABNORMAL LOW (ref 37.5–51.0)
Hemoglobin: 12.8 g/dL — ABNORMAL LOW (ref 13.0–17.7)
MCH: 32.7 pg (ref 26.6–33.0)
MCHC: 34.4 g/dL (ref 31.5–35.7)
MCV: 95 fL (ref 79–97)
Platelets: 204 10*3/uL (ref 150–450)
RBC: 3.91 x10E6/uL — ABNORMAL LOW (ref 4.14–5.80)
RDW: 12.6 % (ref 11.6–15.4)
WBC: 6.7 10*3/uL (ref 3.4–10.8)

## 2022-06-08 LAB — PRO B NATRIURETIC PEPTIDE: NT-Pro BNP: 4033 pg/mL — ABNORMAL HIGH (ref 0–486)

## 2022-06-08 NOTE — Telephone Encounter (Signed)
Called and spoke with pt letting him know that JD was out of the office until 10/24. Asked if he wanted [place] to have another provider review his recent cxr or if he wanted to wait until JD returned and he stated that he would wait for JD to review the cxr when he returned back to the office.  Routing to Dr. Erin Fulling. Please advise.

## 2022-06-16 ENCOUNTER — Other Ambulatory Visit: Payer: Self-pay

## 2022-06-16 DIAGNOSIS — I5032 Chronic diastolic (congestive) heart failure: Secondary | ICD-10-CM

## 2022-06-16 LAB — BASIC METABOLIC PANEL
BUN/Creatinine Ratio: 19 (ref 10–24)
BUN: 20 mg/dL (ref 8–27)
CO2: 26 mmol/L (ref 20–29)
Calcium: 9.3 mg/dL (ref 8.6–10.2)
Chloride: 102 mmol/L (ref 96–106)
Creatinine, Ser: 1.06 mg/dL (ref 0.76–1.27)
Glucose: 85 mg/dL (ref 70–99)
Potassium: 3.9 mmol/L (ref 3.5–5.2)
Sodium: 142 mmol/L (ref 134–144)
eGFR: 69 mL/min/{1.73_m2} (ref >59)

## 2022-06-19 NOTE — Telephone Encounter (Signed)
Spoke with Pt and he is still experiencing SOB every morning. Per Dr August Albino recommendation Pt  scheduled for OV with TP on 06/23/22. Nothing further needed at this time.  Routing to TP as an [place]

## 2022-06-19 NOTE — Telephone Encounter (Signed)
Hi [name],  The chest x-ray shows possible fluid around the left side of the lung. Has your shortness of breath improved since the Cardiology team increased your lasix? If not we should get you back in clinic for an exam and consider repeating a CT chest scan.  Thanks, JD

## 2022-06-23 ENCOUNTER — Ambulatory Visit (INDEPENDENT_AMBULATORY_CARE_PROVIDER_SITE_OTHER): Payer: Medicare Other

## 2022-06-23 ENCOUNTER — Ambulatory Visit: Payer: Medicare Other | Admitting: Adult Health

## 2022-06-23 ENCOUNTER — Ambulatory Visit: Payer: Medicare Other

## 2022-06-23 ENCOUNTER — Encounter: Payer: Self-pay | Admitting: Adult Health

## 2022-06-23 VITALS — BP 128/78 | HR 85 | Temp 97.8°F | Ht 66.0 in | Wt 162.8 lb

## 2022-06-23 DIAGNOSIS — J181 Lobar pneumonia, unspecified organism: Secondary | ICD-10-CM

## 2022-06-23 DIAGNOSIS — R042 Hemoptysis: Secondary | ICD-10-CM

## 2022-06-23 DIAGNOSIS — Z7901 Long term (current) use of anticoagulants: Secondary | ICD-10-CM

## 2022-06-23 DIAGNOSIS — C3491 Malignant neoplasm of unspecified part of right bronchus or lung: Secondary | ICD-10-CM

## 2022-06-23 DIAGNOSIS — C3490 Malignant neoplasm of unspecified part of unspecified bronchus or lung: Secondary | ICD-10-CM | POA: Insufficient documentation

## 2022-06-23 DIAGNOSIS — I5032 Chronic diastolic (congestive) heart failure: Secondary | ICD-10-CM

## 2022-06-23 DIAGNOSIS — R0602 Shortness of breath: Secondary | ICD-10-CM | POA: Diagnosis not present

## 2022-06-23 DIAGNOSIS — J90 Pleural effusion, not elsewhere classified: Secondary | ICD-10-CM | POA: Diagnosis not present

## 2022-06-23 LAB — CBC WITH DIFFERENTIAL/PLATELET
Basophils Absolute: 0 10*3/uL (ref 0.0–0.1)
Basophils Relative: 0.5 % (ref 0.0–3.0)
Eosinophils Absolute: 0 10*3/uL (ref 0.0–0.7)
Eosinophils Relative: 0.4 % (ref 0.0–5.0)
HCT: 39.5 % (ref 39.0–52.0)
Hemoglobin: 13 g/dL (ref 13.0–17.0)
Lymphocytes Relative: 6 % — ABNORMAL LOW (ref 12.0–46.0)
Lymphs Abs: 0.5 10*3/uL — ABNORMAL LOW (ref 0.7–4.0)
MCHC: 32.8 g/dL (ref 30.0–36.0)
MCV: 99.4 fl (ref 78.0–100.0)
Monocytes Absolute: 0.6 10*3/uL (ref 0.1–1.0)
Monocytes Relative: 7.5 % (ref 3.0–12.0)
Neutro Abs: 7.4 10*3/uL (ref 1.4–7.7)
Neutrophils Relative %: 85.6 % — ABNORMAL HIGH (ref 43.0–77.0)
Platelets: 187 10*3/uL (ref 150.0–400.0)
RBC: 3.98 Mil/uL — ABNORMAL LOW (ref 4.22–5.81)
RDW: 15.4 % (ref 11.5–15.5)
WBC: 8.6 10*3/uL (ref 4.0–10.5)

## 2022-06-23 LAB — POCT INR: INR: 2.9 (ref 2.0–3.0)

## 2022-06-23 LAB — SEDIMENTATION RATE: Sed Rate: 11 mm/hr (ref 0–20)

## 2022-06-23 LAB — PROTIME-INR
INR: 2.7 ratio — ABNORMAL HIGH (ref 0.8–1.0)
Prothrombin Time: 28 s — ABNORMAL HIGH (ref 9.6–13.1)

## 2022-06-23 LAB — BRAIN NATRIURETIC PEPTIDE: Pro B Natriuretic peptide (BNP): 481 pg/mL — ABNORMAL HIGH (ref 0.0–100.0)

## 2022-06-23 MED ORDER — AMOXICILLIN-POT CLAVULANATE 875-125 MG PO TABS: 1.0000 | ORAL_TABLET | Freq: Two times a day (BID) | ORAL | 0 refills | Status: DC

## 2022-06-23 MED ORDER — PREDNISONE 20 MG PO TABS: 20.0000 mg | ORAL_TABLET | Freq: Every day | ORAL | 0 refills | Status: DC

## 2022-06-23 NOTE — Assessment & Plan Note (Signed)
Recurrent hemoptysis with underlying diagnosis of previous lung cancer initially diagnosed in 2002 status post SBRT.  Patient with increased interstitial markings and opacities bilaterally left greater than right status post navigational bronchoscopy in March 2023 with negative cultures and malignant cells.  Chest x-ray shows progressive interstitial opacities left greater than right.  Patient does have a productive cough with thick discolored mucus and intermittent hemoptysis.  He is on Coumadin.  Will check INR and CBC.  Recent cardiac evaluation with increased BNP level no significant improvement with increased diuresis.  Chest x-ray worse today.  We will set up for CT scan.  Check lab work including sed rate, hypersensitivity pneumonitis panel Treat with empiric antibiotics and a short steroid burst.  Plan  Patient Instructions  Set up for CT chest without contrast  Labs today  Augmentin 875mg  Twice daily  for 7 days , take with food  Mucinex DM Liquid Twice daily  As needed  cough/congestion.  Increase Prednisone 20mg  daily for 1 week then back to 10mg  daily .  Avoid Ibuprofen like meds, NSAIDs   Albuterol inhaler As needed   Follow up with Dr. Erin Fulling in 2 weeks and As needed   Please contact office for sooner follow up if symptoms do not improve or worsen or seek emergency care

## 2022-06-23 NOTE — Assessment & Plan Note (Signed)
Does not appear to be fluid overloaded on exam.  Recent labs showed elevated BNP.  Lasix was increased at cardiology visit 2 weeks ago.  No perceived clinical benefit.  We will repeat BMP and be met today.  Continue on current regimen.

## 2022-06-23 NOTE — Progress Notes (Signed)
Pt seen by pulmonology earlier today for pneumonia. Prescribed Augmentin 875mg  twice daily for 7 days and was instructed to increase prednisone to 20mg  daily for 1 week then back to 10mg  daily.   Pt instructed to hold today's dose then decrease weekly dose to 1/2 tablet daily. Recheck in 4 weeks per pt request.

## 2022-06-23 NOTE — Patient Instructions (Addendum)
Set up for CT chest without contrast  Labs today  Augmentin 875mg  Twice daily  for 7 days , take with food  Mucinex DM Liquid Twice daily  As needed  cough/congestion.  Increase Prednisone 20mg  daily for 1 week then back to 10mg  daily .  Avoid Ibuprofen like meds, NSAIDs   Albuterol inhaler As needed   Follow up with Dr. Erin Fulling in 2 weeks and As needed   Please contact office for sooner follow up if symptoms do not improve or worsen or seek emergency care

## 2022-06-23 NOTE — Assessment & Plan Note (Addendum)
Check INR today.  Patient is to avoid nonsteroidal product such as ibuprofen.  Patient education was given

## 2022-06-23 NOTE — Patient Instructions (Signed)
Hold today's dose.  Then change weekly dose to 1/2 tablet daily. Recheck in 4 weeks.

## 2022-06-23 NOTE — Assessment & Plan Note (Signed)
Non-small cell lung cancer diagnosed 2022 status post SBRT to the right upper lobe nodule.  CT chest in February showed evolving postradiation fibrosis of the right upper lobe and worsening of patchy groundglass attenuation bilaterally.  Underwent navigational bronchoscopy November 14, 2021 with negative cytology and cultures.  Continues to have ongoing increased interstitial markings questionable etiology.  We will repeat CT chest.

## 2022-06-23 NOTE — Progress Notes (Signed)
_0  ID: [name], male    DOB: 1936/08/01, 86 y.o.   MRN: [id number]  Chief Complaint  Patient presents with   Follow-up    Referring provider: Isaac Bliss, [facility]*  HPI: 86 year old male former smoker followed for non-small cell lung cancer and recurrent hemoptysis Medical history significant for aortic valve replacement and atrial fibrillation on Coumadin  TEST/EVENTS :  underwent navigational bronchoscopy 03/07/21 for the RUL lesion and RLL lesion with the RUL lesion positive for non-small cancer and no malignancy noted of the RLL lesion, only inflammation with benign reparative changes noted. He completed SBRT to the RUL nodule 04/2021.   CT chest October 07, 2021 evolving postradiation fibrosis in the right upper lobe mass, worsening patchy groundglass attenuation and septal thickening throughout the lungs bilaterally-increased from previous exams persistent masslike area in the right lower lobe, emphysema  Navigational bronchoscopy November 14, 2021 cytology negative for malignant cells, cultures negative (RLL, LUL )   06/23/2022 Follow up : Lung cancer , hemoptysis  Patient presents for a 97-monthfollow-up.  Patient complains over the last week he has had increased shortness of breath,  increased cough with thick mucus and intermittent blood-tinged mucus.  Patient has a history of lung cancer that was diagnosed in 2022.  He underwent SBRT to the right upper lobe nodule September 2022.  Most recent CT chest and February 2023 showed evolving postradiation fibrosis in the right upper lobe with worsening groundglass attenuation throughout the lungs bilaterally.  Along with persistent masslike area in the right lower lobe.  Started on BREO last visit with no perceived benefit . Did not take for long .  Chest x-ray today shows increased interstitial markings and airspace opacities in the left lung with increased areas and increased right basilar interstitial opacities. Denies  any weight loss. Appetite is fair,no n/v/d.  Seen by cardiology 2 weeks ago.  BNP was elevated he is Lasix was increased to 60 mg daily.  Patient says he had no improvement in symptoms.  Continues to be short of breath with activities.  Patient is on Coumadin.  Last INR was May 12, 2022 that was therapeutic at 2.2  Retired . Raises chickens , chicken coop . Limited contact.  No Pet. No hot tub or basement .    No Known Allergies  Immunization History  Administered Date(s) Administered   Fluad Quad(high Dose 65+) 05/23/2019, 04/22/2021, 05/12/2022   Influenza, High Dose Seasonal PF 08/17/2015, 05/31/2016   Influenza-Unspecified 06/05/2018, 05/23/2019, 07/06/2020, 04/28/2021   Moderna Sars-Covid-2 Vaccination 10/25/2019, 11/22/2019, 08/19/2020   PFIZER Comirnaty(Gray Top)Covid-19 Tri-Sucrose Vaccine 05/23/2022   Pneumococcal Conjugate-13 08/17/2015   Pneumococcal Polysaccharide-23 08/28/2004   Td 08/29/2003   Tetanus 06/10/2014    Past Medical History:  Diagnosis Date   Aortic stenosis    Atrial fibrillation (HCC)    Baker cyst    Left knee   DJD (degenerative joint disease)    knees   Dysrhythmia    Eczema    Erectile dysfunction    Gout    History of hiatal hernia    History of radiation therapy    Right lung- 04/21/21-05/03/21 Dr. JGery Pray  HTN (hypertension)    MVP (mitral valve prolapse)    Shortness of breath dyspnea    with activity    Tobacco History: Social History   Tobacco Use  Smoking Status Former   Years: 30.00   Types: Cigarettes   Quit date: 08/28/2002   Years since quitting: 19.8  Smokeless  Tobacco Never   Counseling given: Not Answered   Outpatient Medications Prior to Visit  Medication Sig Dispense Refill   Ferrous Sulfate (IRON PO) Take 65 mg by mouth daily.     furosemide (LASIX) 40 MG tablet Take 1 tablet by mouth once daily 90 tablet 1   Ibuprofen 200 MG CAPS Take 400 mg by mouth daily.     potassium chloride SA (KLOR-CON  M) 20 MEQ tablet Take 1 tablet by mouth once daily 90 tablet 3   predniSONE (DELTASONE) 20 MG tablet TAKE 1/2 (ONE-HALF) TABLET BY MOUTH ONCE DAILY WITH BREAKFAST 45 tablet 0   tamsulosin (FLOMAX) 0.4 MG CAPS capsule Take 1 capsule (0.4 mg total) by mouth daily after breakfast. 90 capsule 1   traMADol (ULTRAM) 50 MG tablet TAKE 1 TABLET BY MOUTH TWICE DAILY FOR  CHRONIC  PAIN 60 tablet 0   traZODone (DESYREL) 50 MG tablet Take 1 tablet (50 mg total) by mouth at bedtime as needed. 90 tablet 0   triamcinolone cream (KENALOG) 0.1 % APPLY  CREAM EXTERNALLY TO AFFECTED AREA TWICE DAILY 80 g 0   VITAMIN D PO Take 1 tablet by mouth daily.     warfarin (COUMADIN) 5 MG tablet TAKE 1/2 TABLET BY MOUTH  DAILY EXCEPT TAKE 1 TABLET ON MONDAYS OR AS  DIRECTED  BY  ANTICOAGULATION  CLINIC 75 tablet 1   allopurinol (ZYLOPRIM) 300 MG tablet Take 1 tablet by mouth once daily 90 tablet 0   amLODipine (NORVASC) 5 MG tablet Take 1 tablet by mouth once daily 90 tablet 0   Budeson-Glycopyrrol-Formoterol (BREZTRI AEROSPHERE) 160-9-4.8 MCG/ACT AERO Inhale 2 puffs into the lungs in the morning and at bedtime. (Patient not taking: Reported on 06/07/2022) 10.7 g 0   enoxaparin (LOVENOX) 120 MG/0.8ML injection Inject 0.8 mL (120 mg) daily under the skin as directed by anticoagulation clinic. (Patient not taking: Reported on 12/02/2021) 5.6 mL 0   fluticasone furoate-vilanterol (BREO ELLIPTA) 200-25 MCG/ACT AEPB Inhale 1 puff into the lungs daily. (Patient not taking: Reported on 06/07/2022) 1 each 3   No facility-administered medications prior to visit.     Review of Systems:   Constitutional:   No  weight loss, night sweats,  Fevers, chills,  +fatigue, or  lassitude.  HEENT:   No headaches,  Difficulty swallowing,  Tooth/dental problems, or  Sore throat,                No sneezing, itching, ear ache,  +nasal congestion, post nasal drip,   CV:  No chest pain,  Orthopnea, PND, swelling in lower extremities, anasarca,  dizziness, palpitations, syncope.   GI  No heartburn, indigestion, abdominal pain, nausea, vomiting, diarrhea, change in bowel habits, loss of appetite, bloody stools.   Resp: No chest wall deformity  Skin: no rash or lesions.  GU: no dysuria, change in color of urine, no urgency or frequency.  No flank pain, no hematuria   MS:  No joint pain or swelling.  No decreased range of motion.  No back pain.    Physical Exam  BP 128/78 (BP Location: Left Arm, Patient Position: Sitting, Cuff Size: Normal)   Pulse 85   Temp 97.8 F (36.6 C) (Oral)   Ht 5' 6" (1.676 m)   Wt 162 lb 12.8 oz (73.8 kg)   SpO2 93%   BMI 26.28 kg/m   GEN: A/Ox3; pleasant , NAD, well nourished    HEENT:  [place]/AT,   NOSE-clear, THROAT-clear, no lesions, no postnasal  drip or exudate noted.   NECK:  Supple w/ fair ROM; no JVD; normal carotid impulses w/o bruits; no thyromegaly or nodules palpated; no lymphadenopathy.    RESP  few trace rhonchi   no accessory muscle use, no dullness to percussion  CARD:  RRR, no m/r/g, no peripheral edema, pulses intact, no cyanosis or clubbing.  GI:   Soft & nt; nml bowel sounds; no organomegaly or masses detected.   Musco: Warm bil, no deformities or joint swelling noted.   Neuro: alert, no focal deficits noted.    Skin: Warm, no lesions or rashes    Lab Results:  CBC    Component Value Date/Time   WBC 6.7 06/07/2022 0939   WBC 7.4 10/25/2021 1015   RBC 3.91 (L) 06/07/2022 0939   RBC 4.10 (L) 10/25/2021 1015   HGB 12.8 (L) 06/07/2022 0939   HCT 37.2 (L) 06/07/2022 0939   PLT 204 06/07/2022 0939   MCV 95 06/07/2022 0939   MCH 32.7 06/07/2022 0939   MCH 33.1 03/07/2021 0649   MCHC 34.4 06/07/2022 0939   MCHC 33.0 10/25/2021 1015   RDW 12.6 06/07/2022 0939   LYMPHSABS 0.4 (L) 10/25/2021 1015   LYMPHSABS 1.1 07/09/2017 1229   MONOABS 0.4 10/25/2021 1015   EOSABS 0.0 10/25/2021 1015   EOSABS 0.0 07/09/2017 1229   BASOSABS 0.0 10/25/2021 1015   BASOSABS 0.0  07/09/2017 1229    BMET    Component Value Date/Time   NA 142 06/16/2022 1126   K 3.9 06/16/2022 1126   CL 102 06/16/2022 1126   CO2 26 06/16/2022 1126   GLUCOSE 85 06/16/2022 1126   GLUCOSE 91 10/25/2021 1015   BUN 20 06/16/2022 1126   CREATININE 1.06 06/16/2022 1126   CREATININE 0.86 03/23/2015 1019   CALCIUM 9.3 06/16/2022 1126   GFRNONAA >60 03/07/2021 0649   GFRNONAA 84 03/23/2015 1019   GFRAA 82 04/02/2020 1019   GFRAA >89 03/23/2015 1019    BNP    Component Value Date/Time   BNP 374.9 (H) 06/06/2015 1227    ProBNP    Component Value Date/Time   PROBNP 4,033 (H) 06/07/2022 0939    Imaging: DG Chest 2 View  Result Date: 06/23/2022 CLINICAL DATA:  Pulmonary opacities EXAM: CHEST - 2 VIEW COMPARISON:  Chest radiograph dated 06/07/2022. FINDINGS: The heart size and mediastinal contours are within normal limits. Vascular calcifications are seen in the aortic arch. Interstitial and airspace opacities in the left lung have increased since prior exam, now moderate in severity. Mild right basilar interstitial and airspace opacities appear increased from prior exam. Small bilateral pleural effusions, left-greater-than-right, are noted. There is no pneumothorax. Degenerative changes are seen in the spine. IMPRESSION: 1. Increased interstitial and airspace opacities in the left lung, now moderate in severity. 2. Mild right basilar interstitial and airspace opacities, increased from prior exam. 3. Small bilateral pleural effusions, left-greater-than-right. Electronically Signed   By: Zerita Boers M.D.   On: 06/23/2022 10:42   DG Chest 2 View  Result Date: 06/08/2022 CLINICAL DATA:  3-4 weeks of extreme shortness of breath. EXAM: CHEST - 2 VIEW COMPARISON:  Chest x-rays dated 11/14/2021, 08/16/2021 and 03/16/2021. FINDINGS: Persistent opacities overlying the majority of the LEFT lung, mostly ground-glass opacities but with slightly denser consolidations at the LEFT lung base.  Probable small LEFT pleural effusion. RIGHT lung is relatively clear, perhaps mild ground-glass opacities at the RIGHT lung base. No pneumothorax is seen. Heart size and mediastinal contours stable. Median sternotomy wires  appear intact and stable in alignment. No acute-appearing osseous abnormality. Degenerative spondylosis and ankylosis of the thoracolumbar spine, mild to moderate in degree. IMPRESSION: 1. Persistent opacities overlying the majority of the LEFT lung, mostly ground-glass opacities but with slightly denser consolidations at the LEFT lung base, suspicious for multifocal pneumonia. Differential for the ground-glass opacities includes atypical pneumonias such as viral or fungal, interstitial pneumonias, asymmetric edema related to volume overload/CHF, chronic interstitial diseases, hypersensitivity pneumonitis, and respiratory bronchiolitis. These findings are not significantly changed compared to the most recent chest x-rays of 11/14/2021 and 08/08/2021, as well as the chest CT of 10/07/2021, but is new compared to the earlier chest x-ray of 03/16/2021. 2. Probable small LEFT pleural effusion. 3. Stable appearance of the RIGHT lung apex. Earlier chest CT report provided additional clinical data of post radiation fibrosis at the site of a treated RIGHT upper lobe neoplasm. Electronically Signed   By: Franki Cabot M.D.   On: 06/08/2022 08:37          No data to display          No results found for: "NITRICOXIDE"      Assessment & Plan:   Hemoptysis Recurrent hemoptysis with underlying diagnosis of previous lung cancer initially diagnosed in 2002 status post SBRT.  Patient with increased interstitial markings and opacities bilaterally left greater than right status post navigational bronchoscopy in March 2023 with negative cultures and malignant cells.  Chest x-ray shows progressive interstitial opacities left greater than right.  Patient does have a productive cough with thick  discolored mucus and intermittent hemoptysis.  He is on Coumadin.  Will check INR and CBC.  Recent cardiac evaluation with increased BNP level no significant improvement with increased diuresis.  Chest x-ray worse today.  We will set up for CT scan.  Check lab work including sed rate, hypersensitivity pneumonitis panel Treat with empiric antibiotics and a short steroid burst.  Plan  Patient Instructions  Set up for CT chest without contrast  Labs today  Augmentin 880m Twice daily  for 7 days , take with food  Mucinex DM Liquid Twice daily  As needed  cough/congestion.  Increase Prednisone 22mdaily for 1 week then back to 1071maily .  Avoid Ibuprofen like meds, NSAIDs   Albuterol inhaler As needed   Follow up with Dr. DewErin Fulling 2 weeks and As needed   Please contact office for sooner follow up if symptoms do not improve or worsen or seek emergency care       Chronic diastolic CHF (congestive heart failure) ([place] not appear to be fluid overloaded on exam.  Recent labs showed elevated BNP.  Lasix was increased at cardiology visit 2 weeks ago.  No perceived clinical benefit.  We will repeat BMP and be met today.  Continue on current regimen.  Non[place]small cell lung cancer [place] cell lung cancer diagnosed 2022 status post SBRT to the right upper lobe nodule.  CT chest in February showed evolving postradiation fibrosis of the right upper lobe and worsening of patchy groundglass attenuation bilaterally.  Underwent navigational bronchoscopy November 14, 2021 with negative cytology and cultures.  Continues to have ongoing increased interstitial markings questionable etiology.  We will repeat CT chest.  Long term (current) use of anticoagulants Check INR today.  Patient is to avoid nonsteroidal product such as ibuprofen.  Patient education was given   I spent   42 minutes dedicated to the care of this patient on the date of this  encounter to include pre-visit review of records,  face-to-face time with the patient discussing conditions above, post visit ordering of testing, clinical documentation with the electronic health record, making appropriate referrals as documented, and communicating necessary findings to members of the patients care team.    Rexene Edison, NP 06/23/2022

## 2022-06-27 NOTE — Progress Notes (Signed)
Called and spoke with patient, advised of results/recommendations per Tammy Parrett NP.  He verbalized understanding.  Nothing further needed.

## 2022-06-28 ENCOUNTER — Other Ambulatory Visit: Payer: Self-pay | Admitting: Internal Medicine

## 2022-06-28 DIAGNOSIS — Z7952 Long term (current) use of systemic steroids: Secondary | ICD-10-CM

## 2022-06-29 ENCOUNTER — Telehealth: Payer: Self-pay | Admitting: *Deleted

## 2022-06-29 ENCOUNTER — Ambulatory Visit (HOSPITAL_BASED_OUTPATIENT_CLINIC_OR_DEPARTMENT_OTHER)
Admission: RE | Admit: 2022-06-29 | Discharge: 2022-06-29 | Disposition: A | Discharge status: Home / Self Care | Payer: Medicare Other | Source: Ambulatory Visit | Attending: Adult Health | Admitting: Adult Health

## 2022-06-29 DIAGNOSIS — I700 Atherosclerosis of aorta: Secondary | ICD-10-CM | POA: Diagnosis not present

## 2022-06-29 DIAGNOSIS — R042 Hemoptysis: Secondary | ICD-10-CM | POA: Diagnosis not present

## 2022-06-29 DIAGNOSIS — J90 Pleural effusion, not elsewhere classified: Secondary | ICD-10-CM | POA: Diagnosis not present

## 2022-06-29 DIAGNOSIS — J439 Emphysema, unspecified: Secondary | ICD-10-CM | POA: Diagnosis not present

## 2022-06-29 NOTE — Telephone Encounter (Signed)
RETURNED PATIENT'S PHONE CALL, SPOKE WITH PATIENT. ?

## 2022-06-30 NOTE — Progress Notes (Signed)
Called and spoke with patient, he has finished his antibiotic and prednisone, he no longer has the rattle in his chest and he can breathe much better.  Advised that Dr. Erin Fulling will review the CT scan in detail with him on Wednesday 11/8.  I let him know to arrive at 9:45 am for a 10 am appointment.  He verbalized understanding, nothing further needed.

## 2022-07-03 ENCOUNTER — Ambulatory Visit: Payer: Self-pay | Admitting: Radiation Oncology

## 2022-07-04 LAB — HYPERSENSITIVITY PNEUMONITIS
A. Pullulans Abs: POSITIVE — AB
A.Fumigatus #1 Abs: NEGATIVE
Micropolyspora faeni, IgG: NEGATIVE
Pigeon Serum Abs: NEGATIVE
Thermoact. Saccharii: NEGATIVE
Thermoactinomyces vulgaris, IgG: NEGATIVE

## 2022-07-05 ENCOUNTER — Ambulatory Visit: Payer: Medicare Other | Admitting: Pulmonary Disease

## 2022-07-05 ENCOUNTER — Ambulatory Visit (INDEPENDENT_AMBULATORY_CARE_PROVIDER_SITE_OTHER): Payer: Medicare Other

## 2022-07-05 ENCOUNTER — Other Ambulatory Visit: Payer: Self-pay | Admitting: Internal Medicine

## 2022-07-05 ENCOUNTER — Encounter: Payer: Self-pay | Admitting: Pulmonary Disease

## 2022-07-05 VITALS — BP 118/72 | HR 78 | Ht 66.0 in | Wt 158.0 lb

## 2022-07-05 DIAGNOSIS — J679 Hypersensitivity pneumonitis due to unspecified organic dust: Secondary | ICD-10-CM

## 2022-07-05 MED ORDER — SULFAMETHOXAZOLE-TRIMETHOPRIM 800-160 MG PO TABS: 1.0000 | ORAL_TABLET | ORAL | 3 refills | Status: DC

## 2022-07-05 MED ORDER — PREDNISONE 20 MG PO TABS: ORAL_TABLET | ORAL | 0 refills | Status: DC

## 2022-07-05 NOTE — Patient Instructions (Addendum)
We are treating you for hypersensitivity pneumonitis   Start prednisone taper: 60 mg daily 11/8 - 12/6 40 mg daily 12/7 - 1/4   Take 1 tablet bactrim Monday, Wednesday and Friday  Follow up in 1 month

## 2022-07-05 NOTE — Patient Instructions (Signed)
Hold warfarin on Mondays, Wednesdays, and Fridays. Continue to take 1/2 tablet (2.5 mg) on Sundays, Tuesdays, Thursdays, and Saturdays. Return for INR check on 07/14/22.

## 2022-07-05 NOTE — Progress Notes (Signed)
Synopsis: Referred in 08/2020 for hemoptysis   Subjective:   PATIENT ID: [name]: male DOB: [birth year], MRN: [id number]  HPI  Chief Complaint  Patient presents with   Follow-up    2wk f/u. Had CT earlier this month. SOB has improved some. Still has a productive cough with small amounts of blood.    [name] is an 86 year old male, former smoker with aortic valve replacement and atrial fibrillation on coumadin who returns to pulmonary clinic for hemoptysis and shortness of breath.   He had repeat bronchoscopy 11/14/21. He was seen in acute visit by Dr. Ander Slade 12/02/21 with review of bronchoscopy results. No malignant cells noted from biopsies. He was given breo ellipta at that time and then stopped it. He saw Rexene Edison, NP 06/23/22 where x-ray showed progressive interstitial infiltrates. 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   His shortness of breath is significantly improved at this time after being treated with 43m of prednisone for a week and course of augmentin.  OV 10/12/21 He was treated for pneumonia in December after visit with SEric Form NP with augmentin. He does not report much improvement after the antibiotic. He is experiencing increasing shortness of breath.  He continues to cough up blood on a daily basis despite lowering his INR goal. He has lost 6lbs since we met 1 year ago.  He underwent navigational bronchoscopy 03/07/21 for the RUL lesion and RLL lesion with the RUL lesion positive for non-small cancer and no malignancy noted of the RLL lesion, only inflammation with benign reparative changes noted. He completed SBRT to the RUL nodule 04/2021.   Past Medical History:  Diagnosis Date   Aortic stenosis    Atrial fibrillation (HCC)    Baker cyst    Left knee   DJD (degenerative joint disease)    knees   Dysrhythmia    Eczema    Erectile  dysfunction    Gout    History of hiatal hernia    History of radiation therapy    Right lung- 04/21/21-05/03/21 Dr. JGery Pray  HTN (hypertension)    MVP (mitral valve prolapse)    Shortness of breath dyspnea    with activity     Family History  Problem Relation Age of Onset   Diabetes Other    Stroke Other      Social History   Socioeconomic History   Marital status: Divorced    Spouse name: Not on file   Number of children: Not on file   Years of education: Not on file   Highest education level: Not on file  Occupational History   Not on file  Tobacco Use   Smoking status: Former    Years: 30.00    Types: Cigarettes    Quit date: 08/28/2002    Years since quitting: 19.8   Smokeless tobacco: Never  Vaping Use   Vaping Use: Never used  Substance and Sexual Activity   Alcohol use: Yes    Alcohol/week: 3.0 standard drinks of alcohol    Types: 3 Shots of liquor per week    Comment: social   Drug use: No   Sexual activity: Not on file  Other Topics Concern   Not on file  Social History Narrative   Not on file   Social Determinants of Health   Financial Resource Strain: Not on file  Food Insecurity: Not  on file  Transportation Needs: Not on file  Physical Activity: Not on file  Stress: Not on file  Social Connections: Not on file  Intimate Partner Violence: Not on file     No Known Allergies   Outpatient Medications Prior to Visit  Medication Sig Dispense Refill   allopurinol (ZYLOPRIM) 300 MG tablet Take 1 tablet by mouth once daily 90 tablet 0   amLODipine (NORVASC) 5 MG tablet Take 1 tablet by mouth once daily 90 tablet 0   Ferrous Sulfate (IRON PO) Take 65 mg by mouth daily.     furosemide (LASIX) 40 MG tablet Take 1 tablet by mouth once daily 90 tablet 1   Ibuprofen 200 MG CAPS Take 400 mg by mouth daily.     potassium chloride SA (KLOR-CON M) 20 MEQ tablet Take 1 tablet by mouth once daily 90 tablet 3   predniSONE (DELTASONE) 20 MG tablet TAKE  1/2 (ONE-HALF) TABLET BY MOUTH ONCE DAILY WITH BREAKFAST 45 tablet 0   tamsulosin (FLOMAX) 0.4 MG CAPS capsule Take 1 capsule (0.4 mg total) by mouth daily after breakfast. 90 capsule 1   traMADol (ULTRAM) 50 MG tablet TAKE 1 TABLET BY MOUTH TWICE DAILY FOR  CHRONIC  PAIN 60 tablet 0   traZODone (DESYREL) 50 MG tablet Take 1 tablet (50 mg total) by mouth at bedtime as needed. 90 tablet 0   triamcinolone cream (KENALOG) 0.1 % APPLY  CREAM EXTERNALLY TO AFFECTED AREA TWICE DAILY 80 g 0   VITAMIN D PO Take 1 tablet by mouth daily.     warfarin (COUMADIN) 5 MG tablet TAKE 1/2 TABLET BY MOUTH  DAILY EXCEPT TAKE 1 TABLET ON MONDAYS OR AS  DIRECTED  BY  ANTICOAGULATION  CLINIC 75 tablet 1   amoxicillin-clavulanate (AUGMENTIN) 875-125 MG tablet Take 1 tablet by mouth 2 (two) times daily. 14 tablet 0   Budeson-Glycopyrrol-Formoterol (BREZTRI AEROSPHERE) 160-9-4.8 MCG/ACT AERO Inhale 2 puffs into the lungs in the morning and at bedtime. (Patient not taking: Reported on 06/07/2022) 10.7 g 0   enoxaparin (LOVENOX) 120 MG/0.8ML injection Inject 0.8 mL (120 mg) daily under the skin as directed by anticoagulation clinic. (Patient not taking: Reported on 12/02/2021) 5.6 mL 0   fluticasone furoate-vilanterol (BREO ELLIPTA) 200-25 MCG/ACT AEPB Inhale 1 puff into the lungs daily. (Patient not taking: Reported on 06/07/2022) 1 each 3   predniSONE (DELTASONE) 20 MG tablet Take 1 tablet (20 mg total) by mouth daily with breakfast. 7 tablet 0   No facility-administered medications prior to visit.   Review of Systems  Constitutional:  Negative for chills, fever, malaise/fatigue and weight loss.  HENT:  Negative for nosebleeds.   Eyes: Negative.   Respiratory:  Positive for cough, hemoptysis and shortness of breath. Negative for sputum production and wheezing.   Cardiovascular:  Negative for chest pain, palpitations, orthopnea, claudication, leg swelling and PND.  Gastrointestinal:  Negative for abdominal pain, blood in  stool, heartburn, nausea and vomiting.  Genitourinary:  Negative for hematuria.  Musculoskeletal:  Positive for joint pain.  Skin:  Negative for rash.  Neurological:  Negative for dizziness, weakness and headaches.  Endo/Heme/Allergies:  Does not bruise/bleed easily.  Psychiatric/Behavioral: Negative.      Objective:   Vitals:   07/05/22 0959  BP: 118/72  Pulse: 78  SpO2: 96%  Weight: 158 lb (71.7 kg)  Height: _0  (1.676 m)   Physical Exam Constitutional:      Appearance: Normal appearance. He is normal weight.  HENT:  Head: Normocephalic and atraumatic.     Nose: Nose normal. No congestion or rhinorrhea.  Eyes:     Conjunctiva/sclera: Conjunctivae normal.  Cardiovascular:     Rate and Rhythm: Normal rate. Rhythm irregular.     Pulses: Normal pulses.     Heart sounds: Normal heart sounds.  Pulmonary:     Effort: Pulmonary effort is normal.     Breath sounds: Rales (left basilar) present. No wheezing or rhonchi.  Musculoskeletal:     Right lower leg: No edema.     Left lower leg: No edema.  Skin:    General: Skin is warm and dry.  Neurological:     General: No focal deficit present.     Mental Status: He is alert.  Psychiatric:        Mood and Affect: Mood normal.        Behavior: Behavior normal.        Thought Content: Thought content normal.        Judgment: Judgment normal.    CBC    Component Value Date/Time   WBC 8.6 06/23/2022 1149   RBC 3.98 (L) 06/23/2022 1149   HGB 13.0 06/23/2022 1149   HGB 12.8 (L) 06/07/2022 0939   HCT 39.5 06/23/2022 1149   HCT 37.2 (L) 06/07/2022 0939   PLT 187.0 06/23/2022 1149   PLT 204 06/07/2022 0939   MCV 99.4 06/23/2022 1149   MCV 95 06/07/2022 0939   MCH 32.7 06/07/2022 0939   MCH 33.1 03/07/2021 0649   MCHC 32.8 06/23/2022 1149   RDW 15.4 06/23/2022 1149   RDW 12.6 06/07/2022 0939   LYMPHSABS 0.5 (L) 06/23/2022 1149   LYMPHSABS 1.1 07/09/2017 1229   MONOABS 0.6 06/23/2022 1149   EOSABS 0.0 06/23/2022  1149   EOSABS 0.0 07/09/2017 1229   BASOSABS 0.0 06/23/2022 1149   BASOSABS 0.0 07/09/2017 1229      Latest Ref Rng & Units 06/16/2022   11:26 AM 06/07/2022    9:39 AM 10/25/2021   10:15 AM  BMP  Glucose 70 - 99 mg/dL 85  90  91   BUN 8 - 27 mg/dL _0 Creatinine 0.76 - 1.27 mg/dL 1.06  1.12  0.97   BUN/Creat Ratio 10 - _1 Sodium 134 - 144 mmol/L 142  141  140   Potassium 3.5 - 5.2 mmol/L 3.9  3.7  3.9   Chloride 96 - 106 mmol/L 102  103  100   CO2 20 - 29 mmol/L 26  27  32   Calcium 8.6 - 10.2 mg/dL 9.3  8.8  9.0    Chest imaging: CT Chest 06/29/22 1. Evolving post treatment changes about the RIGHT apical lesion with contracting post radiation fibrosis. 2. Waxing and waning RIGHT basilar consolidation now with new LEFT basilar consolidation. Findings could be attributed to basilar pneumonia or sequela of recurrent aspiration. 3. Background of worsening ground-glass and septal thickening throughout the LEFT greater than RIGHT chest. Findings are atypical given lack of subpleural sparing for pulmonary hemorrhage but would correlate with INR and any other ongoing anticoagulation. Sequela of pneumonitis or background developing interstitial lung disease is considered. Would correlate with any drug related or other risk factors for worsening pneumonitis. 4. Background pulmonary emphysema as before. 5. Small LEFT pleural effusion is slightly enlarged compared to previous imaging. New RIGHT-sided pleural effusion. 6. Engorgement of central pulmonary vasculature to 3.5 cm, can be seen in the  setting of pulmonary arterial hypertension. 7. Aortic atherosclerosis.  CT Chest wo contrast 10/07/21 1. Evolving postradiation fibrosis at the site of the treated right upper lobe neoplasm. 2. Worsening patchy ground-glass attenuation and septal thickening throughout the lungs bilaterally. This is nonspecific, but has been chronic and intermittent over the past year, but  most severe on today's examination. It is unlikely that these regions are within the radiation treatment window. Clinical correlation for signs of worsening chronic atypical infection is suggested. 3. Persistent mass-like area in the medial aspect of the right lower lobe. This demonstrated low-level hypermetabolism on prior PET-CT, which is nonspecific. The possibility of slow-growing neoplasm such as adenocarcinoma could be considered, and in the appropriate clinical setting could account for the other parenchymal changes in the lungs if it was associated with aerogenous spread of disease. Tissue sampling of this area should be considered if not already obtained. 4. Aortic atherosclerosis, in addition to left main and three-vessel coronary artery disease. 5. Diffuse bronchial wall thickening with mild centrilobular and paraseptal emphysema; imaging findings suggestive of underlying COPD.  CXR 08/08/21 Left greater than right lower lobe pneumonia. Possible small associated pleural effusion(s).  PET Scan 03/31/21 1. Low level FDG uptake in the small sub solid right upper lobe lesions. Recommend correlation with biopsy results. 2. Low level FDG uptake in the right medial lower lobe process. No discrete mass or focal area hypermetabolism. 3. No enlarged or hypermetabolic mediastinal or hilar lymph nodes or evidence of metastatic disease elsewhere. 4. Advanced vascular disease.  CXR 08/06/20 Patchy bibasilar opacities. No pneumothorax or pleural effusion. Stable cardiomegaly and postsurgical appearance of the cardiomediastinal silhouette. Multilevel spondylosis.  CTA Chest 05/2008 Mild emphysematous changes noted in upper lobes.  PFT:     No data to display         Echo: 2019 - Left ventricle: The cavity size was normal. Wall thickness was    increased in a pattern of mild LVH. Indeterminant diastolic    function (atrial fibrillation). Systolic function was normal. The     estimated ejection fraction was in the range of 55% to 60%. Wall    motion was normal; there were no regional wall motion    abnormalities.  - Aortic valve: Bioprosthetic aortic valve. The bioprosthetic    aortic valve appears to function normally. There was no    significant regurgitation. Mean gradient (S): 13 mm Hg. Valve    area (VTI): 1.37 cm^2.  - Aorta: Status post aortic root replacement.  - Mitral valve: Mildly to moderately calcified annulus. There was    trivial regurgitation. Valve area by continuity equation (using    LVOT flow): 2.27 cm^2.  - Left atrium: The atrium was moderately to severely dilated.  - Right ventricle: The cavity size was mildly dilated. Systolic    function was mildly reduced.  - Right atrium: The atrium was moderately to severely dilated.  - Tricuspid valve: Peak RV-RA gradient (S): 33 mm Hg.  - Pulmonary arteries: PA peak pressure: 36 mm Hg (S).   Assessment & Plan:   Hypersensitivity pneumonia ([place]) - Plan: predniSONE (DELTASONE) 20 MG tablet, sulfamethoxazole-trimethoprim (BACTRIM DS) 800-160 MG tablet  Discussion: [name] is an 86 year old male, former smoker with aortic valve replacement and atrial fibrillation on coumadin who returns to pulmonary clinic for NSCLC s/p radiation 9/22, hemoptysis and shortness of breath.   He has progressive ground glass attenuation and septal thickening of the inferior left upper lobe and throughout the left lower  lobe. The RLL mass lesion remains stable. There are post-radiation changes of the RUL.   There is concern for hypersensitivity pneumonitits as he has positive A. Pullulans abs from hypersensitivity pneumonitis panel. He has responded to increase in steroids to 30m daily from his baseline of 173mdaily. We will plan for prolonged steroid taper with 6021mf prednisone for 1 month, then 20m57mily for 1 month and 30mg83mly for 1 month. We will continue steroid taper based on his response at follow up  visits.  The etiology of his on-going hemoptysis are not well known. Differential includes indolent infection vs inflammatory component vs malignancy. He had ENT evaluation with no signs of upper airway involvement. The bronch and PET scan are reassuring that the RLL lesion is less likely malignant.   Follow-up in 1 month.  [name] Office: [phone] Outpatient Medications:    allopurinol (ZYLOPRIM) 300 MG tablet, Take 1 tablet by mouth once daily, Disp: 90 tablet, Rfl: 0   amLODipine (NORVASC) 5 MG tablet, Take 1 tablet by mouth once daily, Disp: 90 tablet, Rfl: 0   Ferrous Sulfate (IRON PO), Take 65 mg by mouth daily., Disp: , Rfl:    furosemide (LASIX) 40 MG tablet, Take 1 tablet by mouth once daily, Disp: 90 tablet, Rfl: 1   Ibuprofen 200 MG CAPS, Take 400 mg by mouth daily., Disp: , Rfl:    potassium chloride SA (KLOR-CON M) 20 MEQ tablet, Take 1 tablet by mouth once daily, Disp: 90 tablet, Rfl: 3   predniSONE (DELTASONE) 20 MG tablet, TAKE 1/2 (ONE-HALF) TABLET BY MOUTH ONCE DAILY WITH BREAKFAST, Disp: 45 tablet, Rfl: 0   predniSONE (DELTASONE) 20 MG tablet, Take 3 tablets (60 mg total) by mouth daily with breakfast for 30 days, THEN 2 tablets (40 mg total) daily with breakfast for 30 days, THEN 1 tablet (20 mg total) daily with breakfast., Disp: 180 tablet, Rfl: 0   sulfamethoxazole-trimethoprim (BACTRIM DS) 800-160 MG tablet, Take 1 tablet by mouth 3 (three) times a week., Disp: 12 tablet, Rfl: 3   tamsulosin (FLOMAX) 0.4 MG CAPS capsule, Take 1 capsule (0.4 mg total) by mouth daily after breakfast., Disp: 90 capsule, Rfl: 1   traMADol (ULTRAM) 50 MG tablet, TAKE 1 TABLET BY MOUTH TWICE DAILY FOR  CHRONIC  PAIN, Disp: 60 tablet, Rfl: 0   traZODone (DESYREL) 50 MG tablet, Take 1 tablet (50 mg total) by mouth at bedtime as needed., Disp: 90 tablet, Rfl: 0   triamcinolone cream (KENALOG) 0.1 %, APPLY  CREAM EXTERNALLY TO  AFFECTED AREA TWICE DAILY, Disp: 80 g, Rfl: 0   VITAMIN D PO, Take 1 tablet by mouth daily., Disp: , Rfl:    warfarin (COUMADIN) 5 MG tablet, TAKE 1/2 TABLET BY MOUTH  DAILY EXCEPT TAKE 1 TABLET ON MONDAYS OR AS  DIRECTED  BY  ANTICOAGULATION  CLINIC, Disp: 75 tablet, Rfl: 1

## 2022-07-05 NOTE — Progress Notes (Signed)
Encounter created for documentation. No INR check today.   Pt seen by pulmonology earlier today for pneumonia. Dr. Erin Fulling sent a staff message notifying anticoagulation clinic that pt was prescribed prednisone 60 mg daily x 30 days, then 40 mg daily x 30 days, then 20 mg daily. Pt was also prescribed Bactrim DS 80 - 160 mg three days per week.   Called patient with below instructions:  Hold warfarin on Mondays, Wednesdays, and Fridays. Continue to take 1/2 tablet (2.5 mg) on Sundays, Tuesdays, Thursdays, and Saturdays. Return for INR check on 07/14/22. Pt verbalized understanding.

## 2022-07-14 ENCOUNTER — Ambulatory Visit (INDEPENDENT_AMBULATORY_CARE_PROVIDER_SITE_OTHER): Payer: Medicare Other

## 2022-07-14 DIAGNOSIS — Z7901 Long term (current) use of anticoagulants: Secondary | ICD-10-CM | POA: Diagnosis not present

## 2022-07-14 LAB — POCT INR: INR: 1.3 — AB (ref 2.0–3.0)

## 2022-07-14 NOTE — Patient Instructions (Signed)
Take 1/2 tablet (2.5 mg) today.  Then change weekly dose to 1/2 tablet daily except take no warfarin on Mondays and Fridays. Return for INR check on 08/01/22 at 10:30.

## 2022-07-14 NOTE — Progress Notes (Signed)
Pt is taking prednisone 60 mg daily x 30 days, then 40 mg daily x 30 days, then 20 mg daily. Pt was also prescribed Bactrim DS 80 - 160 mg three days per week.   Take 1/2 tablet (2.5 mg) today.  Then change weekly dose to 1/2 tablet daily except take no warfarin on Mondays and Fridays. Return for INR check on 08/01/22.

## 2022-07-24 DIAGNOSIS — H2513 Age-related nuclear cataract, bilateral: Secondary | ICD-10-CM | POA: Diagnosis not present

## 2022-07-25 ENCOUNTER — Ambulatory Visit: Payer: Medicare Other

## 2022-07-26 ENCOUNTER — Other Ambulatory Visit: Payer: Self-pay | Admitting: Internal Medicine

## 2022-07-31 ENCOUNTER — Ambulatory Visit: Payer: Medicare Other | Admitting: Radiation Oncology

## 2022-08-01 ENCOUNTER — Ambulatory Visit (INDEPENDENT_AMBULATORY_CARE_PROVIDER_SITE_OTHER): Payer: Medicare Other

## 2022-08-01 DIAGNOSIS — Z7901 Long term (current) use of anticoagulants: Secondary | ICD-10-CM

## 2022-08-01 LAB — POCT INR: INR: 1.6 — AB (ref 2.0–3.0)

## 2022-08-01 NOTE — Patient Instructions (Addendum)
Pre visit review using our clinic review tool, if applicable. No additional management support is needed unless otherwise documented below in the visit note.  Take one tablet today and then change weekly dose to take 1/2 tablet daily except take nothing on Mondays. Recheck in 1 weeks.

## 2022-08-01 NOTE — Progress Notes (Signed)
Pt was prescribed on 07/05/22, prednisone 60 mg daily x 30 days, then 40 mg daily x 30 days, then 20 mg daily. Pt was also prescribed Bactrim DS 80 - 160 mg three days per week. Pt currently has 3 days left of 60 mg prednisone and then changes to 40mg . Pt will finish Batrim tomorrow.   Take one tablet today and then change weekly dose to take 1/2 tablet daily except take nothing on Mondays. Recheck in 1 weeks.

## 2022-08-04 ENCOUNTER — Ambulatory Visit: Payer: Medicare Other | Admitting: Pulmonary Disease

## 2022-08-04 ENCOUNTER — Encounter: Payer: Self-pay | Admitting: Pulmonary Disease

## 2022-08-04 DIAGNOSIS — J679 Hypersensitivity pneumonitis due to unspecified organic dust: Secondary | ICD-10-CM | POA: Diagnosis not present

## 2022-08-04 MED ORDER — SULFAMETHOXAZOLE-TRIMETHOPRIM 800-160 MG PO TABS: 1.0000 | ORAL_TABLET | ORAL | 4 refills | Status: DC

## 2022-08-04 NOTE — Patient Instructions (Addendum)
60 mg daily 11/8 - 12/6 40 mg daily 12/7 - 1/4  30mg  daily 1/5 - 2/2 20mg  daily 2/3 - 3/2  10 mg daily 3/3   Continue to take bactrim pill 3 days per week (Mon, Wed, Fri) until 3/3  Follow in 3 months

## 2022-08-04 NOTE — Progress Notes (Signed)
Synopsis: Referred in 08/2020 for hemoptysis   Subjective:   PATIENT ID: [name]: male DOB: [birth year], MRN: [id number]  HPI  Chief Complaint  Patient presents with   Follow-up    Pt much improved.  Prednisone and abt helped.  No SOB, wheeze or cough noted   [name] is an 87 year old male, former smoker with aortic valve replacement and atrial fibrillation on coumadin who returns to pulmonary clinic for hemoptysis and shortness of breath.   He is feeling much better since starting the steroid taper and starting bactrim antibiotic prophylaxis. He was on 47m daily for 1 month and tapered down to 433m2 days ago. He denies any hemoptysis.   OV 07/05/22 He had repeat bronchoscopy 11/14/21. He was seen in acute visit by Dr. OlAnder Slade/7/23 with review of bronchoscopy results. No malignant cells noted from biopsies. He was given breo ellipta at that time and then stopped it. He saw TaRexene EdisonNP 06/23/22 where x-ray showed progressive interstitial infiltrates. 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   His shortness of breath is significantly improved at this time after being treated with 2096mf prednisone for a week and course of augmentin.  OV 10/12/21 He was treated for pneumonia in December after visit with SarEric FormP with augmentin. He does not report much improvement after the antibiotic. He is experiencing increasing shortness of breath.  He continues to cough up blood on a daily basis despite lowering his INR goal. He has lost 6lbs since we met 1 year ago.  He underwent navigational bronchoscopy 03/07/21 for the RUL lesion and RLL lesion with the RUL lesion positive for non-small cancer and no malignancy noted of the RLL lesion, only inflammation with benign reparative changes noted. He completed SBRT to the RUL nodule 04/2021.   Past Medical History:  Diagnosis  Date   Aortic stenosis    Atrial fibrillation (HCC)    Baker cyst    Left knee   DJD (degenerative joint disease)    knees   Dysrhythmia    Eczema    Erectile dysfunction    Gout    History of hiatal hernia    History of radiation therapy    Right lung- 04/21/21-05/03/21 Dr. JamGery PrayHTN (hypertension)    MVP (mitral valve prolapse)    Shortness of breath dyspnea    with activity     Family History  Problem Relation Age of Onset   Diabetes Other    Stroke Other      Social History   Socioeconomic History   Marital status: Divorced    Spouse name: Not on file   Number of children: Not on file   Years of education: Not on file   Highest education level: Not on file  Occupational History   Not on file  Tobacco Use   Smoking status: Former    Years: 30.00    Types: Cigarettes    Quit date: 08/28/2002    Years since quitting: 19.9   Smokeless tobacco: Never  Vaping Use   Vaping Use: Never used  Substance and Sexual Activity   Alcohol use: Yes    Alcohol/week: 3.0 standard drinks of alcohol    Types: 3 Shots of liquor per week    Comment: social   Drug use: No   Sexual activity: Not on file  Other Topics Concern  Not on file  Social History Narrative   Not on file   Social Determinants of Health   Financial Resource Strain: Not on file  Food Insecurity: Not on file  Transportation Needs: Not on file  Physical Activity: Not on file  Stress: Not on file  Social Connections: Not on file  Intimate Partner Violence: Not on file     No Known Allergies   Outpatient Medications Prior to Visit  Medication Sig Dispense Refill   allopurinol (ZYLOPRIM) 300 MG tablet Take 1 tablet by mouth once daily 90 tablet 0   amLODipine (NORVASC) 5 MG tablet Take 1 tablet by mouth once daily 90 tablet 0   furosemide (LASIX) 40 MG tablet Take 1 tablet by mouth once daily 90 tablet 1   potassium chloride SA (KLOR-CON M) 20 MEQ tablet Take 1 tablet by mouth once daily 90  tablet 3   predniSONE (DELTASONE) 20 MG tablet Take 3 tablets (60 mg total) by mouth daily with breakfast for 30 days, THEN 2 tablets (40 mg total) daily with breakfast for 30 days, THEN 1 tablet (20 mg total) daily with breakfast. 180 tablet 0   tamsulosin (FLOMAX) 0.4 MG CAPS capsule TAKE 1 CAPSULE BY MOUTH ONCE DAILY AFTER BREAKFAST 90 capsule 0   traMADol (ULTRAM) 50 MG tablet TAKE 1 TABLET BY MOUTH TWICE DAILY FOR  CHRONIC  PAIN 60 tablet 0   traZODone (DESYREL) 50 MG tablet Take 1 tablet (50 mg total) by mouth at bedtime as needed. 90 tablet 0   triamcinolone cream (KENALOG) 0.1 % APPLY  CREAM EXTERNALLY TO AFFECTED AREA TWICE DAILY 80 g 0   VITAMIN D PO Take 1 tablet by mouth daily.     warfarin (COUMADIN) 5 MG tablet TAKE 1/2 TABLET BY MOUTH  DAILY EXCEPT TAKE 1 TABLET ON MONDAYS OR AS  DIRECTED  BY  ANTICOAGULATION  CLINIC 75 tablet 1   Ferrous Sulfate (IRON PO) Take 65 mg by mouth daily. (Patient not taking: Reported on 08/04/2022)     Ibuprofen 200 MG CAPS Take 400 mg by mouth daily. (Patient not taking: Reported on 08/04/2022)     predniSONE (DELTASONE) 20 MG tablet TAKE 1/2 (ONE-HALF) TABLET BY MOUTH ONCE DAILY WITH BREAKFAST (Patient not taking: Reported on 08/04/2022) 45 tablet 0   sulfamethoxazole-trimethoprim (BACTRIM DS) 800-160 MG tablet Take 1 tablet by mouth 3 (three) times a week. (Patient not taking: Reported on 08/04/2022) 12 tablet 3   No facility-administered medications prior to visit.   Review of Systems  Constitutional:  Negative for chills, fever, malaise/fatigue and weight loss.  HENT:  Negative for nosebleeds.   Eyes: Negative.   Respiratory:  Negative for cough, hemoptysis, sputum production, shortness of breath and wheezing.   Cardiovascular:  Negative for chest pain, palpitations, orthopnea, claudication, leg swelling and PND.  Gastrointestinal:  Negative for abdominal pain, blood in stool, heartburn, nausea and vomiting.  Genitourinary:  Negative for hematuria.   Musculoskeletal:  Negative for joint pain.  Skin:  Negative for rash.  Neurological:  Negative for dizziness, weakness and headaches.  Endo/Heme/Allergies:  Does not bruise/bleed easily.  Psychiatric/Behavioral: Negative.      Objective:   Vitals:   08/04/22 1056  BP: 134/78  Pulse: 90  Temp: 98 F (36.7 C)  TempSrc: Oral  SpO2: 99%  Weight: 157 lb (71.2 kg)  Height: _0  (1.651 m)   Physical Exam Constitutional:      Appearance: Normal appearance. He is normal weight.  HENT:  Head: Normocephalic and atraumatic.     Nose: Nose normal. No congestion or rhinorrhea.  Eyes:     Conjunctiva/sclera: Conjunctivae normal.  Cardiovascular:     Rate and Rhythm: Normal rate. Rhythm irregular.     Pulses: Normal pulses.     Heart sounds: Normal heart sounds.  Pulmonary:     Effort: Pulmonary effort is normal.     Breath sounds: No wheezing, rhonchi or rales.  Musculoskeletal:     Right lower leg: No edema.     Left lower leg: No edema.  Skin:    General: Skin is warm and dry.  Neurological:     General: No focal deficit present.     Mental Status: He is alert.  Psychiatric:        Mood and Affect: Mood normal.        Behavior: Behavior normal.        Thought Content: Thought content normal.        Judgment: Judgment normal.    CBC    Component Value Date/Time   WBC 8.6 06/23/2022 1149   RBC 3.98 (L) 06/23/2022 1149   HGB 13.0 06/23/2022 1149   HGB 12.8 (L) 06/07/2022 0939   HCT 39.5 06/23/2022 1149   HCT 37.2 (L) 06/07/2022 0939   PLT 187.0 06/23/2022 1149   PLT 204 06/07/2022 0939   MCV 99.4 06/23/2022 1149   MCV 95 06/07/2022 0939   MCH 32.7 06/07/2022 0939   MCH 33.1 03/07/2021 0649   MCHC 32.8 06/23/2022 1149   RDW 15.4 06/23/2022 1149   RDW 12.6 06/07/2022 0939   LYMPHSABS 0.5 (L) 06/23/2022 1149   LYMPHSABS 1.1 07/09/2017 1229   MONOABS 0.6 06/23/2022 1149   EOSABS 0.0 06/23/2022 1149   EOSABS 0.0 07/09/2017 1229   BASOSABS 0.0 06/23/2022 1149    BASOSABS 0.0 07/09/2017 1229      Latest Ref Rng & Units 06/16/2022   11:26 AM 06/07/2022    9:39 AM 10/25/2021   10:15 AM  BMP  Glucose 70 - 99 mg/dL 85  90  91   BUN 8 - 27 mg/dL _0 Creatinine 0.76 - 1.27 mg/dL 1.06  1.12  0.97   BUN/Creat Ratio 10 - _1 Sodium 134 - 144 mmol/L 142  141  140   Potassium 3.5 - 5.2 mmol/L 3.9  3.7  3.9   Chloride 96 - 106 mmol/L 102  103  100   CO2 20 - 29 mmol/L 26  27  32   Calcium 8.6 - 10.2 mg/dL 9.3  8.8  9.0    Chest imaging: CT Chest 06/29/22 1. Evolving post treatment changes about the RIGHT apical lesion with contracting post radiation fibrosis. 2. Waxing and waning RIGHT basilar consolidation now with new LEFT basilar consolidation. Findings could be attributed to basilar pneumonia or sequela of recurrent aspiration. 3. Background of worsening ground-glass and septal thickening throughout the LEFT greater than RIGHT chest. Findings are atypical given lack of subpleural sparing for pulmonary hemorrhage but would correlate with INR and any other ongoing anticoagulation. Sequela of pneumonitis or background developing interstitial lung disease is considered. Would correlate with any drug related or other risk factors for worsening pneumonitis. 4. Background pulmonary emphysema as before. 5. Small LEFT pleural effusion is slightly enlarged compared to previous imaging. New RIGHT-sided pleural effusion. 6. Engorgement of central pulmonary vasculature to 3.5 cm, can be seen in the setting of pulmonary  arterial hypertension. 7. Aortic atherosclerosis.  CT Chest wo contrast 10/07/21 1. Evolving postradiation fibrosis at the site of the treated right upper lobe neoplasm. 2. Worsening patchy ground-glass attenuation and septal thickening throughout the lungs bilaterally. This is nonspecific, but has been chronic and intermittent over the past year, but most severe on today's examination. It is unlikely that these  regions are within the radiation treatment window. Clinical correlation for signs of worsening chronic atypical infection is suggested. 3. Persistent mass-like area in the medial aspect of the right lower lobe. This demonstrated low-level hypermetabolism on prior PET-CT, which is nonspecific. The possibility of slow-growing neoplasm such as adenocarcinoma could be considered, and in the appropriate clinical setting could account for the other parenchymal changes in the lungs if it was associated with aerogenous spread of disease. Tissue sampling of this area should be considered if not already obtained. 4. Aortic atherosclerosis, in addition to left main and three-vessel coronary artery disease. 5. Diffuse bronchial wall thickening with mild centrilobular and paraseptal emphysema; imaging findings suggestive of underlying COPD.  CXR 08/08/21 Left greater than right lower lobe pneumonia. Possible small associated pleural effusion(s).  PET Scan 03/31/21 1. Low level FDG uptake in the small sub solid right upper lobe lesions. Recommend correlation with biopsy results. 2. Low level FDG uptake in the right medial lower lobe process. No discrete mass or focal area hypermetabolism. 3. No enlarged or hypermetabolic mediastinal or hilar lymph nodes or evidence of metastatic disease elsewhere. 4. Advanced vascular disease.  CXR 08/06/20 Patchy bibasilar opacities. No pneumothorax or pleural effusion. Stable cardiomegaly and postsurgical appearance of the cardiomediastinal silhouette. Multilevel spondylosis.  CTA Chest 05/2008 Mild emphysematous changes noted in upper lobes.  PFT:     No data to display          Echo: 2019 - Left ventricle: The cavity size was normal. Wall thickness was    increased in a pattern of mild LVH. Indeterminant diastolic    function (atrial fibrillation). Systolic function was normal. The    estimated ejection fraction was in the range of 55% to 60%.  Wall    motion was normal; there were no regional wall motion    abnormalities.  - Aortic valve: Bioprosthetic aortic valve. The bioprosthetic    aortic valve appears to function normally. There was no    significant regurgitation. Mean gradient (S): 13 mm Hg. Valve    area (VTI): 1.37 cm^2.  - Aorta: Status post aortic root replacement.  - Mitral valve: Mildly to moderately calcified annulus. There was    trivial regurgitation. Valve area by continuity equation (using    LVOT flow): 2.27 cm^2.  - Left atrium: The atrium was moderately to severely dilated.  - Right ventricle: The cavity size was mildly dilated. Systolic    function was mildly reduced.  - Right atrium: The atrium was moderately to severely dilated.  - Tricuspid valve: Peak RV-RA gradient (S): 33 mm Hg.  - Pulmonary arteries: PA peak pressure: 36 mm Hg (S).   Assessment & Plan:   No diagnosis found.  Discussion: Izaha Shughart is an 86 year old male, former smoker with aortic valve replacement and atrial fibrillation on coumadin who returns to pulmonary clinic for NSCLC s/p radiation 9/22, hemoptysis and shortness of breath.   He is to continue treatment for hypersensitivity pneumonitis with steroid taper as outlined below.   60 mg daily 11/8 - 12/6 40 mg daily 12/7 - 1/4  19m daily 1/5 - 2/2 221m  daily 2/3 - 3/2  10 mg daily 3/3   He is to continue bactrim DS 1 tab 3 days per week for pjp prophylaxis.   Follow up in 3 months.  Freda Jackson, MD [facility] Office: [phone]    Current Outpatient Medications:    allopurinol (ZYLOPRIM) 300 MG tablet, Take 1 tablet by mouth once daily, Disp: 90 tablet, Rfl: 0   amLODipine (NORVASC) 5 MG tablet, Take 1 tablet by mouth once daily, Disp: 90 tablet, Rfl: 0   furosemide (LASIX) 40 MG tablet, Take 1 tablet by mouth once daily, Disp: 90 tablet, Rfl: 1   potassium chloride SA (KLOR-CON M) 20 MEQ tablet, Take 1 tablet by mouth once daily,  Disp: 90 tablet, Rfl: 3   predniSONE (DELTASONE) 20 MG tablet, Take 3 tablets (60 mg total) by mouth daily with breakfast for 30 days, THEN 2 tablets (40 mg total) daily with breakfast for 30 days, THEN 1 tablet (20 mg total) daily with breakfast., Disp: 180 tablet, Rfl: 0   tamsulosin (FLOMAX) 0.4 MG CAPS capsule, TAKE 1 CAPSULE BY MOUTH ONCE DAILY AFTER BREAKFAST, Disp: 90 capsule, Rfl: 0   traMADol (ULTRAM) 50 MG tablet, TAKE 1 TABLET BY MOUTH TWICE DAILY FOR  CHRONIC  PAIN, Disp: 60 tablet, Rfl: 0   traZODone (DESYREL) 50 MG tablet, Take 1 tablet (50 mg total) by mouth at bedtime as needed., Disp: 90 tablet, Rfl: 0   triamcinolone cream (KENALOG) 0.1 %, APPLY  CREAM EXTERNALLY TO AFFECTED AREA TWICE DAILY, Disp: 80 g, Rfl: 0   VITAMIN D PO, Take 1 tablet by mouth daily., Disp: , Rfl:    warfarin (COUMADIN) 5 MG tablet, TAKE 1/2 TABLET BY MOUTH  DAILY EXCEPT TAKE 1 TABLET ON MONDAYS OR AS  DIRECTED  BY  ANTICOAGULATION  CLINIC, Disp: 75 tablet, Rfl: 1   Ferrous Sulfate (IRON PO), Take 65 mg by mouth daily. (Patient not taking: Reported on 08/04/2022), Disp: , Rfl:    Ibuprofen 200 MG CAPS, Take 400 mg by mouth daily. (Patient not taking: Reported on 08/04/2022), Disp: , Rfl:    predniSONE (DELTASONE) 20 MG tablet, TAKE 1/2 (ONE-HALF) TABLET BY MOUTH ONCE DAILY WITH BREAKFAST (Patient not taking: Reported on 08/04/2022), Disp: 45 tablet, Rfl: 0   sulfamethoxazole-trimethoprim (BACTRIM DS) 800-160 MG tablet, Take 1 tablet by mouth 3 (three) times a week. (Patient not taking: Reported on 08/04/2022), Disp: 12 tablet, Rfl: 3

## 2022-08-11 ENCOUNTER — Ambulatory Visit (INDEPENDENT_AMBULATORY_CARE_PROVIDER_SITE_OTHER): Payer: Medicare Other

## 2022-08-11 DIAGNOSIS — Z7901 Long term (current) use of anticoagulants: Secondary | ICD-10-CM | POA: Diagnosis not present

## 2022-08-11 LAB — POCT INR: INR: 2.3 (ref 2.0–3.0)

## 2022-08-11 NOTE — Progress Notes (Signed)
Pt is currently taking prednisone, 40 mg QD. Pt will start 20 mg on 09/01/22   Continue 1/2 tablet daily except take nothing on Mondays. Recheck in 4 weeks.

## 2022-08-11 NOTE — Patient Instructions (Addendum)
Pre visit review using our clinic review tool, if applicable. No additional management support is needed unless otherwise documented below in the visit note.  Continue 1/2 tablet daily except take nothing on Mondays. Recheck in 4 weeks.

## 2022-08-13 ENCOUNTER — Other Ambulatory Visit: Payer: Self-pay | Admitting: Internal Medicine

## 2022-08-13 ENCOUNTER — Other Ambulatory Visit: Payer: Self-pay | Admitting: Cardiology

## 2022-08-13 DIAGNOSIS — R609 Edema, unspecified: Secondary | ICD-10-CM

## 2022-08-23 ENCOUNTER — Other Ambulatory Visit: Payer: Self-pay | Admitting: Internal Medicine

## 2022-09-08 ENCOUNTER — Telehealth: Payer: Self-pay | Admitting: Internal Medicine

## 2022-09-08 ENCOUNTER — Ambulatory Visit: Payer: Medicare Other

## 2022-09-08 NOTE — Telephone Encounter (Signed)
Patients daughter called to cancel appointment for the coumadin clinic for today, she didn't want to reschedule at this time

## 2022-09-11 ENCOUNTER — Other Ambulatory Visit: Payer: Self-pay

## 2022-09-11 ENCOUNTER — Emergency Department (HOSPITAL_BASED_OUTPATIENT_CLINIC_OR_DEPARTMENT_OTHER): Payer: Medicare Other

## 2022-09-11 ENCOUNTER — Emergency Department (HOSPITAL_BASED_OUTPATIENT_CLINIC_OR_DEPARTMENT_OTHER)
Admission: EM | Admit: 2022-09-11 | Discharge: 2022-09-11 | Disposition: A | Discharge status: Home / Self Care | Payer: Medicare Other | Attending: Emergency Medicine | Admitting: Emergency Medicine

## 2022-09-11 DIAGNOSIS — Z7901 Long term (current) use of anticoagulants: Secondary | ICD-10-CM | POA: Diagnosis not present

## 2022-09-11 DIAGNOSIS — W01198A Fall on same level from slipping, tripping and stumbling with subsequent striking against other object, initial encounter: Secondary | ICD-10-CM | POA: Insufficient documentation

## 2022-09-11 DIAGNOSIS — R338 Other retention of urine: Secondary | ICD-10-CM | POA: Diagnosis not present

## 2022-09-11 DIAGNOSIS — R3914 Feeling of incomplete bladder emptying: Secondary | ICD-10-CM | POA: Diagnosis not present

## 2022-09-11 DIAGNOSIS — S0990XA Unspecified injury of head, initial encounter: Secondary | ICD-10-CM

## 2022-09-11 DIAGNOSIS — S0093XA Contusion of unspecified part of head, initial encounter: Secondary | ICD-10-CM | POA: Insufficient documentation

## 2022-09-11 NOTE — ED Provider Notes (Signed)
[facility] EMERGENCY DEPARTMENT Provider Note   CSN: [id number] Arrival date & time: 09/11/22  1331     History  Chief Complaint  Patient presents with   [name]    [name] is a 87 y.o. male.  He presents emergency department after being instructed to come in for head injury that occurred 4 days ago.  He takes Coumadin.  Patient states that he was squatting down, fell forward and hit his head and the right side of his shoulder and knee.  He did not lose consciousness.  He had an abrasion and hematoma to the right forehead.  He states that he has just been a little bit sore but denies any inability to ambulate, upper extremity paresthesias or weakness, neck pain.  He states he feels fine otherwise.  He went to his urologist today for urinary retention had a catheter placed and was told that he needed to come here to get a CT scan of his head.   Fall       Home Medications Prior to Admission medications   Medication Sig Start Date End Date Taking? Authorizing Provider  allopurinol (ZYLOPRIM) 300 MG tablet Take 1 tablet by mouth once daily 08/15/22   Philip Aspen, Limmie Patricia, MD  amLODipine (NORVASC) 5 MG tablet Take 1 tablet by mouth once daily 08/15/22   Philip Aspen, Limmie Patricia, MD  furosemide (LASIX) 40 MG tablet Take 1 tablet by mouth once daily 08/14/22   Lewayne Bunting, MD  potassium chloride SA (KLOR-CON M) 20 MEQ tablet Take 1 tablet by mouth once daily 11/15/21   Lewayne Bunting, MD  predniSONE (DELTASONE) 20 MG tablet Take 3 tablets (60 mg total) by mouth daily with breakfast for 30 days, THEN 2 tablets (40 mg total) daily with breakfast for 30 days, THEN 1 tablet (20 mg total) daily with breakfast. 07/05/22 10/03/22  Martina Sinner, MD  sulfamethoxazole-trimethoprim (BACTRIM DS) 800-160 MG tablet Take 1 tablet by mouth 3 (three) times a week. 08/04/22   Martina Sinner, MD  tamsulosin (FLOMAX) 0.4 MG CAPS capsule TAKE 1 CAPSULE BY MOUTH ONCE DAILY  AFTER BREAKFAST 07/05/22   Philip Aspen, Limmie Patricia, MD  traMADol (ULTRAM) 50 MG tablet TAKE 1 TABLET BY MOUTH TWICE DAILY FOR  CHRONIC  PAIN 08/24/22   Shirline Frees, NP  traZODone (DESYREL) 50 MG tablet Take 1 tablet (50 mg total) by mouth at bedtime as needed. 05/23/22   Philip Aspen, Limmie Patricia, MD  triamcinolone cream (KENALOG) 0.1 % APPLY  CREAM EXTERNALLY TO AFFECTED AREA TWICE DAILY 05/22/22   Philip Aspen, Limmie Patricia, MD  VITAMIN D PO Take 1 tablet by mouth daily.    [provider]  warfarin (COUMADIN) 5 MG tablet TAKE 1/2 TABLET BY MOUTH  DAILY EXCEPT TAKE 1 TABLET ON MONDAYS OR AS  DIRECTED  BY  ANTICOAGULATION  CLINIC 03/28/22   Philip Aspen, Limmie Patricia, MD      Allergies    Patient has no known allergies.    Review of Systems   Review of Systems  Physical Exam Updated Vital Signs BP 133/61 (BP Location: Right Arm)   Pulse 90   Temp 98.2 F (36.8 C) (Oral)   Resp 18   SpO2 100%  Physical Exam Vitals and nursing note reviewed.  Constitutional:      General: He is not in acute distress.    Appearance: He is well-developed. He is not diaphoretic.  HENT:  Head: Normocephalic.     Comments: Old appearing hematoma to the right temporal area, small abrasion which is well-healing Eyes:     General: No scleral icterus.    Conjunctiva/sclera: Conjunctivae normal.  Cardiovascular:     Rate and Rhythm: Normal rate and regular rhythm.     Heart sounds: Normal heart sounds.  Pulmonary:     Effort: Pulmonary effort is normal. No respiratory distress.     Breath sounds: Normal breath sounds.  Abdominal:     Palpations: Abdomen is soft.     Tenderness: There is no abdominal tenderness.  Musculoskeletal:     Cervical back: Normal range of motion and neck supple.  Skin:    General: Skin is warm and dry.  Neurological:     Mental Status: He is alert.     Comments: Speech is clear and goal oriented, follows commands Major Cranial nerves without deficit, no  facial droop Normal strength in upper and lower extremities bilaterally including dorsiflexion and plantar flexion, strong and equal grip strength Sensation normal to light and sharp touch Moves extremities without ataxia, coordination intact Normal finger to nose and rapid alternating movements Neg romberg, no pronator drift Normal gait Normal heel-shin and balance   Psychiatric:        Behavior: Behavior normal.     ED Results / Procedures / Treatments   Labs (all labs ordered are listed, but only abnormal results are displayed) Labs Reviewed - No data to display  EKG None  Radiology CT Head Wo Contrast  Result Date: 09/11/2022 CLINICAL DATA:  Head trauma.  Fall. EXAM: CT HEAD WITHOUT CONTRAST TECHNIQUE: Contiguous axial images were obtained from the base of the skull through the vertex without intravenous contrast. RADIATION DOSE REDUCTION: This exam was performed according to the departmental dose-optimization program which includes automated exposure control, adjustment of the mA and/or kV according to patient size and/or use of iterative reconstruction technique. COMPARISON:  None Available. FINDINGS: Brain: There is no evidence of an acute infarct, intracranial hemorrhage, mass, midline shift, or extra-axial fluid collection. Patchy hypodensities in the cerebral white matter bilaterally are nonspecific but compatible with mild-to-moderate chronic small vessel ischemic disease. Mild cerebral atrophy is within normal limits for age. Vascular: Calcified atherosclerosis at the skull base. No hyperdense vessel. Skull: No acute fracture or suspicious osseous lesion. Sinuses/Orbits: No significant inflammatory changes in the included portions of the paranasal sinuses. Clear mastoid air cells. Unremarkable orbits. Other: None. IMPRESSION: 1. No evidence of acute intracranial abnormality. 2. Mild-to-moderate chronic small vessel ischemic disease. Electronically Signed   By: Logan Bores M.D.    On: 09/11/2022 14:15    Procedures Procedures    Medications Ordered in ED Medications - No data to display  ED Course/ Medical Decision Making/ A&P                             Medical Decision Making 87 year old gentleman here for evaluation of head injury that occurred several days ago.  He has no neurologic deficits.  He is on Coumadin.  His CT scan which I visualized and interpreted shows no evidence of acute subdural hematoma or other bleeding or skull fracture.  He is feeling fine other than some minor soreness.  He did not have syncope or chest pain.  He appears otherwise appropriate for discharge at this time with close outpatient follow-up and return precautions.          Final Clinical  Impression(s) / ED Diagnoses Final diagnoses:  Minor head injury without loss of consciousness, initial encounter    Rx / DC Orders ED Discharge Orders     None         Arthor Captain, PA-C 09/11/22 1513    Lonell Grandchild, MD 09/11/22 2337

## 2022-09-11 NOTE — ED Notes (Addendum)
Patient verbalizes understanding of discharge instructions. Education on s/s of development of worsening symptoms, and instructed on return to ED for signs of these. Opportunity for questioning and answers were provided. Armband removed by staff, pt discharged from ED. Ambulated out to lobby with wife.

## 2022-09-11 NOTE — ED Triage Notes (Signed)
Patient presents to ED via POV from home. Here post fall on Thursday. Patient states he was on his knees, attempting to get up when he fell onto his face. Patient is on coumadin. Denies pain.

## 2022-09-11 NOTE — Discharge Instructions (Signed)
Get help right away if: You have: A severe headache that is not helped by medicine. Trouble walking or weakness in your arms and legs. Clear or bloody fluid coming from your nose or ears. Changes in your vision. A seizure. Increased confusion or irritability. Your symptoms get worse. You are sleepier than normal and have trouble staying awake. You lose your balance. Your pupils change size. Your speech is slurred. Your dizziness gets worse. You vomit. These symptoms may represent a serious problem that is an emergency. Do not wait to see if the symptoms will go away. Get medical help right away. Call your local emergency services (911 in the [place] Do not drive yourself to the hospital.

## 2022-09-12 NOTE — Telephone Encounter (Signed)
LVM for pt to return call to RS.

## 2022-09-13 NOTE — Telephone Encounter (Signed)
Pt called and RS apt.

## 2022-09-18 ENCOUNTER — Encounter (HOSPITAL_BASED_OUTPATIENT_CLINIC_OR_DEPARTMENT_OTHER): Payer: Self-pay | Admitting: Emergency Medicine

## 2022-09-18 ENCOUNTER — Other Ambulatory Visit: Payer: Self-pay

## 2022-09-18 ENCOUNTER — Ambulatory Visit (INDEPENDENT_AMBULATORY_CARE_PROVIDER_SITE_OTHER): Payer: Medicare Other

## 2022-09-18 DIAGNOSIS — Z7901 Long term (current) use of anticoagulants: Secondary | ICD-10-CM | POA: Insufficient documentation

## 2022-09-18 DIAGNOSIS — Z87891 Personal history of nicotine dependence: Secondary | ICD-10-CM | POA: Diagnosis not present

## 2022-09-18 DIAGNOSIS — I4891 Unspecified atrial fibrillation: Secondary | ICD-10-CM | POA: Diagnosis not present

## 2022-09-18 DIAGNOSIS — Z79899 Other long term (current) drug therapy: Secondary | ICD-10-CM | POA: Insufficient documentation

## 2022-09-18 DIAGNOSIS — I10 Essential (primary) hypertension: Secondary | ICD-10-CM | POA: Insufficient documentation

## 2022-09-18 DIAGNOSIS — R339 Retention of urine, unspecified: Secondary | ICD-10-CM | POA: Diagnosis not present

## 2022-09-18 DIAGNOSIS — R338 Other retention of urine: Secondary | ICD-10-CM | POA: Diagnosis not present

## 2022-09-18 LAB — URINALYSIS, ROUTINE W REFLEX MICROSCOPIC
Bilirubin Urine: NEGATIVE
Glucose, UA: NEGATIVE mg/dL
Hgb urine dipstick: NEGATIVE
Ketones, ur: NEGATIVE mg/dL
Leukocytes,Ua: NEGATIVE
Nitrite: NEGATIVE
Protein, ur: NEGATIVE mg/dL
Specific Gravity, Urine: 1.01 (ref 1.005–1.030)
pH: 6 (ref 5.0–8.0)

## 2022-09-18 LAB — POCT INR
INR: 1.4 — AB (ref 2.0–3.0)
INR: 1.8 — AB (ref 2.0–3.0)

## 2022-09-18 MED ORDER — LIDOCAINE HCL URETHRAL/MUCOSAL 2 % EX GEL
1.0000 | Freq: Once | CUTANEOUS | Status: DC | PRN
  Filled 2022-09-18: qty 30

## 2022-09-18 MED ORDER — LIDOCAINE HCL URETHRAL/MUCOSAL 2 % EX GEL
1.0000 | Freq: Once | CUTANEOUS | Status: AC
  Administered 2022-09-18: 1 via URETHRAL

## 2022-09-18 NOTE — ED Triage Notes (Signed)
Pt had cath put in last week, was removed at 0900 this morning has been unable to urinate since.

## 2022-09-18 NOTE — Patient Instructions (Addendum)
Pre visit review using our clinic review tool, if applicable. No additional management support is needed unless otherwise documented below in the visit note.  Take 1/2 tablet today and increase dose tomorrow to take 1 tablet and the continue 1/2 tablet daily except take nothing on Mondays. Recheck in 2 weeks.

## 2022-09-18 NOTE — Progress Notes (Signed)
Pt in ER on 1/15 with fall and head injury. Pt reports inability to urinate after fall. They placed a catheter at ER. Catheter was removed this morning by urology. If pt cannot urinate by later this afternoon he is to contact urology.  Pt also taking prednisone, 20 mg, QD and Bactrim 3 times weekly.  Take 1/2 tablet today and increase dose tomorrow to take 1 tablet and the continue 1/2 tablet daily except take nothing on Mondays. Recheck in 2 weeks.

## 2022-09-19 ENCOUNTER — Emergency Department (HOSPITAL_BASED_OUTPATIENT_CLINIC_OR_DEPARTMENT_OTHER)
Admission: EM | Admit: 2022-09-19 | Discharge: 2022-09-19 | Disposition: A | Discharge status: Home / Self Care | Payer: Medicare Other | Attending: Emergency Medicine | Admitting: Emergency Medicine

## 2022-09-19 DIAGNOSIS — R338 Other retention of urine: Secondary | ICD-10-CM

## 2022-09-19 NOTE — ED Provider Notes (Signed)
[place] [facility] Provider Note: Georgena Spurling, MD, FACEP  CSN: [id number] MRN: [id number] ARRIVAL: 09/18/22 at 2157 ROOM: [place]  Urinary Retention   HISTORY OF PRESENT ILLNESS  09/19/22 12:37 AM [name] is a 87 y.o. male who had a Foley catheter placed by his urologist on 09/11/2022 for acute urinary retention.  The catheter was removed yesterday morning at 9 AM and he has been unable to urinate since.  He arrived in significant discomfort.  A Foley catheter was placed in triage with relief of his bladder discomfort.    Past Medical History:  Diagnosis Date   Aortic stenosis    Atrial fibrillation (HCC)    Baker cyst    Left knee   DJD (degenerative joint disease)    knees   Dysrhythmia    Eczema    Erectile dysfunction    Gout    History of hiatal hernia    History of radiation therapy    Right lung- 04/21/21-05/03/21 Dr. Gery Pray   HTN (hypertension)    MVP (mitral valve prolapse)    Shortness of breath dyspnea    with activity    Past Surgical History:  Procedure Laterality Date    polyps vocal cord  1994   AORTIC VALVE REPLACEMENT  ~2004   BRONCHIAL BIOPSY  03/07/2021   Procedure: BRONCHIAL BIOPSIES;  Surgeon: Collene Gobble, MD;  Location: [facility] ENDOSCOPY;  Service: Pulmonary;;   BRONCHIAL BIOPSY  11/14/2021   Procedure: BRONCHIAL BIOPSIES;  Surgeon: Collene Gobble, MD;  Location: [facility] ENDOSCOPY;  Service: Pulmonary;;   BRONCHIAL BRUSHINGS  03/07/2021   Procedure: BRONCHIAL BRUSHINGS;  Surgeon: Collene Gobble, MD;  Location: [facility] ENDOSCOPY;  Service: Pulmonary;;   BRONCHIAL BRUSHINGS  11/14/2021   Procedure: BRONCHIAL BRUSHINGS;  Surgeon: Collene Gobble, MD;  Location: [facility] ENDOSCOPY;  Service: Pulmonary;;   BRONCHIAL NEEDLE ASPIRATION BIOPSY  03/07/2021   Procedure: BRONCHIAL NEEDLE ASPIRATION BIOPSIES;  Surgeon: Collene Gobble, MD;  Location: [facility] ENDOSCOPY;  Service: Pulmonary;;   BRONCHIAL NEEDLE ASPIRATION BIOPSY  11/14/2021    Procedure: BRONCHIAL NEEDLE ASPIRATION BIOPSIES;  Surgeon: Collene Gobble, MD;  Location: [facility] ENDOSCOPY;  Service: Pulmonary;;   BRONCHIAL WASHINGS  09/21/2020   Procedure: BRONCHIAL WASHINGS;  Surgeon: Freddi Starr, MD;  Location: [facility] ENDOSCOPY;  Service: Pulmonary;;  BAL    BRONCHIAL WASHINGS  03/07/2021   Procedure: BRONCHIAL WASHINGS;  Surgeon: Collene Gobble, MD;  Location: [facility] ENDOSCOPY;  Service: Pulmonary;;   BRONCHIAL WASHINGS  11/14/2021   Procedure: BRONCHIAL WASHINGS;  Surgeon: Collene Gobble, MD;  Location: [place];  Service: Pulmonary;;   FIDUCIAL MARKER PLACEMENT  03/07/2021   Procedure: FIDUCIAL MARKER PLACEMENT;  Surgeon: Collene Gobble, MD;  Location: [facility] ENDOSCOPY;  Service: Pulmonary;;   HERNIA REPAIR  ~1978   HERNIA REPAIR  ~1983   LUMBAR LAMINECTOMY/DECOMPRESSION MICRODISCECTOMY N/A 01/15/2015   Procedure: 2 LEVEL DECOMPRESSIVE LUMBAR LAMINECTOMY L3-L4,L4-L5;  Surgeon: Latanya Maudlin, MD;  Location: [facility] ORS;  Service: Orthopedics;  Laterality: N/A;   LUMBAR LAMINECTOMY/DECOMPRESSION MICRODISCECTOMY N/A 05/01/2017   Procedure: Lumbar one-Lumbar two, Lumbar two-Lumbar three, Lumbar three-Lumbar four Laminectomy; Evacuation of hematoma;  Surgeon: Consuella Lose, MD;  Location: [place];  Service: Neurosurgery;  Laterality: N/A;   right total hip  ~4 years ago   [place] Left ~2009   TOTAL KNEE ARTHROPLASTY Left 04/14/2015   Procedure: LEFT TOTAL KNEE ARTHROPLASTY;  Surgeon: Latanya Maudlin, MD;  Location: [facility] ORS;  Service: Orthopedics;  Laterality: Left;   VIDEO BRONCHOSCOPY N/A 09/21/2020   Procedure: VIDEO BRONCHOSCOPY WITHOUT FLUORO;  Surgeon: Freddi Starr, MD;  Location: [facility] ENDOSCOPY;  Service: Pulmonary;  Laterality: N/A;   VIDEO BRONCHOSCOPY WITH ENDOBRONCHIAL NAVIGATION Right 03/07/2021   Procedure: VIDEO BRONCHOSCOPY WITH ENDOBRONCHIAL NAVIGATION;  Surgeon: Collene Gobble, MD;  Location: [facility] ENDOSCOPY;  Service: Pulmonary;  Laterality: Right;    VIDEO BRONCHOSCOPY WITH RADIAL ENDOBRONCHIAL ULTRASOUND  11/14/2021   Procedure: VIDEO BRONCHOSCOPY WITH RADIAL ENDOBRONCHIAL ULTRASOUND;  Surgeon: Collene Gobble, MD;  Location: [facility] ENDOSCOPY;  Service: Pulmonary;;    Family History  Problem Relation Age of Onset   Diabetes Other    Stroke Other     Social History   Tobacco Use   Smoking status: Former    Years: 30.00    Types: Cigarettes    Quit date: 08/28/2002    Years since quitting: 20.0   Smokeless tobacco: Never  Vaping Use   Vaping Use: Never used  Substance Use Topics   Alcohol use: Yes    Alcohol/week: 3.0 standard drinks of alcohol    Types: 3 Shots of liquor per week    Comment: social   Drug use: No    Prior to Admission medications   Medication Sig Start Date End Date Taking? Authorizing Provider  allopurinol (ZYLOPRIM) 300 MG tablet Take 1 tablet by mouth once daily 08/15/22   Isaac Bliss, Rayford Halsted, MD  amLODipine (NORVASC) 5 MG tablet Take 1 tablet by mouth once daily 08/15/22   Isaac Bliss, Rayford Halsted, MD  furosemide (LASIX) 40 MG tablet Take 1 tablet by mouth once daily 08/14/22   Lelon Perla, MD  potassium chloride SA (KLOR-CON M) 20 MEQ tablet Take 1 tablet by mouth once daily 11/15/21   Lelon Perla, MD  sulfamethoxazole-trimethoprim (BACTRIM DS) 800-160 MG tablet Take 1 tablet by mouth 3 (three) times a week. 08/04/22   Freddi Starr, MD  tamsulosin (FLOMAX) 0.4 MG CAPS capsule TAKE 1 CAPSULE BY MOUTH ONCE DAILY AFTER BREAKFAST 07/05/22   Isaac Bliss, Rayford Halsted, MD  traMADol (ULTRAM) 50 MG tablet TAKE 1 TABLET BY MOUTH TWICE DAILY FOR  CHRONIC  PAIN 08/24/22   Dorothyann Peng, NP  traZODone (DESYREL) 50 MG tablet Take 1 tablet (50 mg total) by mouth at bedtime as needed. 05/23/22   Isaac Bliss, Rayford Halsted, MD  triamcinolone cream (KENALOG) 0.1 % APPLY  CREAM EXTERNALLY TO AFFECTED AREA TWICE DAILY 05/22/22   Isaac Bliss, Rayford Halsted, MD  VITAMIN D PO Take 1 tablet by mouth daily.     [provider]  warfarin (COUMADIN) 5 MG tablet TAKE 1/2 TABLET BY MOUTH  DAILY EXCEPT TAKE 1 TABLET ON MONDAYS OR AS  DIRECTED  BY  ANTICOAGULATION  CLINIC 03/28/22   Isaac Bliss, Rayford Halsted, MD    Allergies Patient has no known allergies.   REVIEW OF SYSTEMS  Negative except as noted here or in the History of Present Illness.   PHYSICAL EXAMINATION  Initial Vital Signs Blood pressure 136/80, pulse 95, temperature 98.7 F (37.1 C), temperature source Oral, resp. rate 20, height 5' 5"  (1.651 m), weight 70.3 kg, SpO2 97 %.  Examination General: Well-developed, well-nourished male in no acute distress; appearance consistent with age of record HENT: normocephalic; atraumatic Eyes: Normal appearance Neck: supple Heart: regular rate and rhythm Lungs: clear to auscultation bilaterally Abdomen: soft; nondistended; nontender; bowel sounds present GU: Foley catheter draining amber urine; bag contains about  1100 mL Extremities: No deformity; full range of motion Neurologic: Awake, alert and oriented; motor function intact in all extremities and symmetric; no facial droop Skin: Warm and dry Psychiatric: Normal mood and affect   RESULTS  Summary of this visit's results, reviewed and interpreted by myself:   EKG Interpretation  Date/Time:    Ventricular Rate:    PR Interval:    QRS Duration:   QT Interval:    QTC Calculation:   R Axis:     Text Interpretation:         Laboratory Studies: Results for orders placed or performed during the hospital encounter of 09/19/22 (from the past 24 hour(s))  Urinalysis, Routine w reflex microscopic     Status: None   Collection Time: 09/18/22 10:24 PM  Result Value Ref Range   Color, Urine YELLOW YELLOW   APPearance CLEAR CLEAR   Specific Gravity, Urine 1.010 1.005 - 1.030   pH 6.0 5.0 - 8.0   Glucose, UA NEGATIVE NEGATIVE mg/dL   Hgb urine dipstick NEGATIVE NEGATIVE   Bilirubin Urine NEGATIVE NEGATIVE   Ketones, ur  NEGATIVE NEGATIVE mg/dL   Protein, ur NEGATIVE NEGATIVE mg/dL   Nitrite NEGATIVE NEGATIVE   Leukocytes,Ua NEGATIVE NEGATIVE   Imaging Studies: No results found.  ED COURSE and MDM  Nursing notes, initial and subsequent vitals signs, including pulse oximetry, reviewed and interpreted by myself.  Vitals:   09/18/22 2221 09/18/22 2221  BP:  136/80  Pulse:  95  Resp:  20  Temp:  98.7 F (37.1 C)  TempSrc:  Oral  SpO2:  97%  Weight: 70.3 kg   Height: 5' 5"  (1.651 m)    Medications  lidocaine (XYLOCAINE) 2 % jelly 1 Application (1 Application Urethral Given 09/18/22 2249)   No evidence of urinary tract infection on urinalysis.  We will refer the patient back to Dr. Louis Meckel his urologist.   PROCEDURES  Procedures   ED DIAGNOSES     ICD-10-CM   1. Acute urinary retention  R33.8          Shanon Rosser, MD 09/19/22 [phone]

## 2022-09-22 ENCOUNTER — Other Ambulatory Visit: Payer: Self-pay | Admitting: Adult Health

## 2022-09-25 ENCOUNTER — Other Ambulatory Visit: Payer: Self-pay | Admitting: Internal Medicine

## 2022-09-25 ENCOUNTER — Other Ambulatory Visit: Payer: Self-pay | Admitting: Adult Health

## 2022-09-25 DIAGNOSIS — G4700 Insomnia, unspecified: Secondary | ICD-10-CM

## 2022-09-25 NOTE — Telephone Encounter (Signed)
Message routed to PCP CMA  

## 2022-09-25 NOTE — Telephone Encounter (Signed)
Refill sent.

## 2022-09-28 DIAGNOSIS — R338 Other retention of urine: Secondary | ICD-10-CM | POA: Diagnosis not present

## 2022-09-29 DIAGNOSIS — R338 Other retention of urine: Secondary | ICD-10-CM | POA: Diagnosis not present

## 2022-09-29 DIAGNOSIS — R310 Gross hematuria: Secondary | ICD-10-CM | POA: Diagnosis not present

## 2022-10-03 ENCOUNTER — Ambulatory Visit (INDEPENDENT_AMBULATORY_CARE_PROVIDER_SITE_OTHER): Payer: Medicare Other

## 2022-10-03 DIAGNOSIS — Z7901 Long term (current) use of anticoagulants: Secondary | ICD-10-CM | POA: Diagnosis not present

## 2022-10-03 LAB — POCT INR: INR: 1.4 — AB (ref 2.0–3.0)

## 2022-10-03 NOTE — Progress Notes (Cosign Needed)
In ER on 1/23 for urinary retention. Pt also taking prednisone 10 mg. Pt is also taking Bactrim 3 times weekly, and will finish tomorrow. Increase dose today to take 1 tablet and increase dose tomorrow to take 1 tablet and then change weekly dose to take 1/2 tablet daily. Recheck in 2 weeks.

## 2022-10-03 NOTE — Patient Instructions (Addendum)
Pre visit review using our clinic review tool, if applicable. No additional management support is needed unless otherwise documented below in the visit note.  Increase dose today to take 1 tablet and increase dose tomorrow to take 1 tablet and then change weekly dose to take 1/2 tablet daily. Recheck in 2 weeks.

## 2022-10-04 ENCOUNTER — Telehealth: Payer: Self-pay | Admitting: Internal Medicine

## 2022-10-04 MED ORDER — PREDNISONE 20 MG PO TABS: 20.0000 mg | ORAL_TABLET | Freq: Every day | ORAL | 0 refills | Status: DC

## 2022-10-04 NOTE — Telephone Encounter (Signed)
Added to medication list. Refill sent

## 2022-10-04 NOTE — Telephone Encounter (Signed)
Pt called to request a refill of the following:  Prednisone (Deltasone) 20 mg  Pt states he only has 3 pills left.   LOV:  10/25/21 = CPE  Please advise.  [place], [place] Phone: [phone]  Fax: [phone]

## 2022-10-17 ENCOUNTER — Ambulatory Visit (INDEPENDENT_AMBULATORY_CARE_PROVIDER_SITE_OTHER): Payer: Medicare Other

## 2022-10-17 DIAGNOSIS — Z7901 Long term (current) use of anticoagulants: Secondary | ICD-10-CM | POA: Diagnosis not present

## 2022-10-17 LAB — POCT INR: INR: 1.8 — AB (ref 2.0–3.0)

## 2022-10-17 NOTE — Progress Notes (Signed)
Pt also taking prednisone 10 mg QD. Pt is also taking Bactrim 3 times weekly. Increase dose today to take 1 tablet and then change weekly dose to take 1/2 tablet daily except take 1 tablet on Mondays. Recheck in 3 weeks.

## 2022-10-17 NOTE — Patient Instructions (Addendum)
Pre visit review using our clinic review tool, if applicable. No additional management support is needed unless otherwise documented below in the visit note.  Increase dose today to take 1 tablet and then change weekly dose to take 1/2 tablet daily except take 1 tablet on Mondays. Recheck in 3 weeks.

## 2022-10-19 DIAGNOSIS — R338 Other retention of urine: Secondary | ICD-10-CM | POA: Diagnosis not present

## 2022-10-26 ENCOUNTER — Encounter: Payer: Self-pay | Admitting: Internal Medicine

## 2022-10-26 ENCOUNTER — Ambulatory Visit (INDEPENDENT_AMBULATORY_CARE_PROVIDER_SITE_OTHER): Payer: Medicare Other | Admitting: Internal Medicine

## 2022-10-26 VITALS — BP 155/84 | HR 70 | Temp 97.6°F | Ht 65.0 in | Wt 164.8 lb

## 2022-10-26 DIAGNOSIS — E538 Deficiency of other specified B group vitamins: Secondary | ICD-10-CM

## 2022-10-26 DIAGNOSIS — M159 Polyosteoarthritis, unspecified: Secondary | ICD-10-CM | POA: Diagnosis not present

## 2022-10-26 DIAGNOSIS — Z0000 Encounter for general adult medical examination without abnormal findings: Secondary | ICD-10-CM | POA: Diagnosis not present

## 2022-10-26 DIAGNOSIS — E876 Hypokalemia: Secondary | ICD-10-CM

## 2022-10-26 DIAGNOSIS — R0602 Shortness of breath: Secondary | ICD-10-CM

## 2022-10-26 DIAGNOSIS — E559 Vitamin D deficiency, unspecified: Secondary | ICD-10-CM | POA: Diagnosis not present

## 2022-10-26 DIAGNOSIS — I10 Essential (primary) hypertension: Secondary | ICD-10-CM

## 2022-10-26 LAB — COMPREHENSIVE METABOLIC PANEL
ALT: 10 U/L (ref 0–53)
AST: 19 U/L (ref 0–37)
Albumin: 3.5 g/dL (ref 3.5–5.2)
Alkaline Phosphatase: 51 U/L (ref 39–117)
BUN: 17 mg/dL (ref 6–23)
CO2: 32 mEq/L (ref 19–32)
Calcium: 9.2 mg/dL (ref 8.4–10.5)
Chloride: 101 mEq/L (ref 96–112)
Creatinine, Ser: 1.21 mg/dL (ref 0.40–1.50)
GFR: 54.83 mL/min — ABNORMAL LOW (ref >60.00)
Glucose, Bld: 80 mg/dL (ref 70–99)
Potassium: 3.9 mEq/L (ref 3.5–5.1)
Sodium: 142 mEq/L (ref 135–145)
Total Bilirubin: 0.5 mg/dL (ref 0.2–1.2)
Total Protein: 5.9 g/dL — ABNORMAL LOW (ref 6.0–8.3)

## 2022-10-26 LAB — CBC WITH DIFFERENTIAL/PLATELET
Basophils Absolute: 0.1 10*3/uL (ref 0.0–0.1)
Basophils Relative: 1.1 % (ref 0.0–3.0)
Eosinophils Absolute: 0.1 10*3/uL (ref 0.0–0.7)
Eosinophils Relative: 1.8 % (ref 0.0–5.0)
HCT: 37.2 % — ABNORMAL LOW (ref 39.0–52.0)
Hemoglobin: 12.5 g/dL — ABNORMAL LOW (ref 13.0–17.0)
Lymphocytes Relative: 18.7 % (ref 12.0–46.0)
Lymphs Abs: 1.3 10*3/uL (ref 0.7–4.0)
MCHC: 33.6 g/dL (ref 30.0–36.0)
MCV: 99.3 fl (ref 78.0–100.0)
Monocytes Absolute: 0.7 10*3/uL (ref 0.1–1.0)
Monocytes Relative: 9.3 % (ref 3.0–12.0)
Neutro Abs: 4.9 10*3/uL (ref 1.4–7.7)
Neutrophils Relative %: 69.1 % (ref 43.0–77.0)
Platelets: 257 10*3/uL (ref 150.0–400.0)
RBC: 3.75 Mil/uL — ABNORMAL LOW (ref 4.22–5.81)
RDW: 14.8 % (ref 11.5–15.5)
WBC: 7.1 10*3/uL (ref 4.0–10.5)

## 2022-10-26 LAB — LIPID PANEL
Cholesterol: 208 mg/dL — ABNORMAL HIGH (ref 0–200)
HDL: 50.5 mg/dL (ref >39.00)
LDL Cholesterol: 128 mg/dL — ABNORMAL HIGH (ref 0–99)
NonHDL: 157
Total CHOL/HDL Ratio: 4
Triglycerides: 145 mg/dL (ref 0.0–149.0)
VLDL: 29 mg/dL (ref 0.0–40.0)

## 2022-10-26 LAB — TSH: TSH: 1.18 u[IU]/mL (ref 0.35–5.50)

## 2022-10-26 LAB — BASIC METABOLIC PANEL
BUN: 17 mg/dL (ref 6–23)
CO2: 32 mEq/L (ref 19–32)
Calcium: 9.2 mg/dL (ref 8.4–10.5)
Chloride: 101 mEq/L (ref 96–112)
Creatinine, Ser: 1.21 mg/dL (ref 0.40–1.50)
GFR: 54.83 mL/min — ABNORMAL LOW (ref >60.00)
Glucose, Bld: 80 mg/dL (ref 70–99)
Potassium: 3.9 mEq/L (ref 3.5–5.1)
Sodium: 142 mEq/L (ref 135–145)

## 2022-10-26 LAB — PSA: PSA: 6.23 ng/mL — ABNORMAL HIGH (ref 0.10–4.00)

## 2022-10-26 LAB — VITAMIN B12: Vitamin B-12: 235 pg/mL (ref 211–911)

## 2022-10-26 LAB — VITAMIN D 25 HYDROXY (VIT D DEFICIENCY, FRACTURES): VITD: 33.03 ng/mL (ref 30.00–100.00)

## 2022-10-26 MED ORDER — HYDROCODONE-ACETAMINOPHEN 5-325 MG PO TABS: 1.0000 | ORAL_TABLET | Freq: Two times a day (BID) | ORAL | 0 refills | Status: DC | PRN

## 2022-10-26 NOTE — Progress Notes (Signed)
Established Patient Office Visit     CC/Reason for Visit: Annual preventive exam, subsequent Medicare wellness visit, discuss acute concerns  HPI: [name] is a 87 y.o. male who is coming in today for the above mentioned reasons. Past Medical History is significant for: Hypertension, atrial fibrillation, aortic valve replacement on warfarin, BPH, chronic low back pain and osteoarthritis of the hands.  Last year he was diagnosed with non-small cell lung cancer and underwent radiation.  He was also found to have hypersensitivity pneumonitis which got better after prednisone.  He is currently on Bactrim 3 times a week for PJP prophylaxis.  Tramadol is no longer working for pain.  His pain is mainly arthritic of his shoulders, elbows and knees.  He is requesting something stronger.  Eye and dental care is up-to-date.   Past Medical/Surgical History: Past Medical History:  Diagnosis Date   Aortic stenosis    Atrial fibrillation (HCC)    Baker cyst    Left knee   DJD (degenerative joint disease)    knees   Dysrhythmia    Eczema    Erectile dysfunction    Gout    History of hiatal hernia    History of radiation therapy    Right lung- 04/21/21-05/03/21 Dr. Gery Pray   HTN (hypertension)    MVP (mitral valve prolapse)    Shortness of breath dyspnea    with activity    Past Surgical History:  Procedure Laterality Date    polyps vocal cord  1994   AORTIC VALVE REPLACEMENT  ~2004   BRONCHIAL BIOPSY  03/07/2021   Procedure: BRONCHIAL BIOPSIES;  Surgeon: Collene Gobble, MD;  Location: [facility] ENDOSCOPY;  Service: Pulmonary;;   BRONCHIAL BIOPSY  11/14/2021   Procedure: BRONCHIAL BIOPSIES;  Surgeon: Collene Gobble, MD;  Location: [facility] ENDOSCOPY;  Service: Pulmonary;;   BRONCHIAL BRUSHINGS  03/07/2021   Procedure: BRONCHIAL BRUSHINGS;  Surgeon: Collene Gobble, MD;  Location: [facility] ENDOSCOPY;  Service: Pulmonary;;   BRONCHIAL BRUSHINGS  11/14/2021   Procedure: BRONCHIAL BRUSHINGS;   Surgeon: Collene Gobble, MD;  Location: [facility] ENDOSCOPY;  Service: Pulmonary;;   BRONCHIAL NEEDLE ASPIRATION BIOPSY  03/07/2021   Procedure: BRONCHIAL NEEDLE ASPIRATION BIOPSIES;  Surgeon: Collene Gobble, MD;  Location: [facility] ENDOSCOPY;  Service: Pulmonary;;   BRONCHIAL NEEDLE ASPIRATION BIOPSY  11/14/2021   Procedure: BRONCHIAL NEEDLE ASPIRATION BIOPSIES;  Surgeon: Collene Gobble, MD;  Location: [facility] ENDOSCOPY;  Service: Pulmonary;;   BRONCHIAL WASHINGS  09/21/2020   Procedure: BRONCHIAL WASHINGS;  Surgeon: Freddi Starr, MD;  Location: [facility] ENDOSCOPY;  Service: Pulmonary;;  BAL    BRONCHIAL WASHINGS  03/07/2021   Procedure: BRONCHIAL WASHINGS;  Surgeon: Collene Gobble, MD;  Location: [facility] ENDOSCOPY;  Service: Pulmonary;;   BRONCHIAL WASHINGS  11/14/2021   Procedure: BRONCHIAL WASHINGS;  Surgeon: Collene Gobble, MD;  Location: [place];  Service: Pulmonary;;   FIDUCIAL MARKER PLACEMENT  03/07/2021   Procedure: FIDUCIAL MARKER PLACEMENT;  Surgeon: Collene Gobble, MD;  Location: [facility] ENDOSCOPY;  Service: Pulmonary;;   HERNIA REPAIR  ~1978   HERNIA REPAIR  ~1983   LUMBAR LAMINECTOMY/DECOMPRESSION MICRODISCECTOMY N/A 01/15/2015   Procedure: 2 LEVEL DECOMPRESSIVE LUMBAR LAMINECTOMY L3-L4,L4-L5;  Surgeon: Latanya Maudlin, MD;  Location: [facility] ORS;  Service: Orthopedics;  Laterality: N/A;   LUMBAR LAMINECTOMY/DECOMPRESSION MICRODISCECTOMY N/A 05/01/2017   Procedure: Lumbar one-Lumbar two, Lumbar two-Lumbar three, Lumbar three-Lumbar four Laminectomy; Evacuation of hematoma;  Surgeon: Consuella Lose, MD;  Location: [place];  Service: Neurosurgery;  Laterality: N/A;   right total hip  ~4 years ago   [place] Left ~2009   TOTAL KNEE ARTHROPLASTY Left 04/14/2015   Procedure: LEFT TOTAL KNEE ARTHROPLASTY;  Surgeon: Latanya Maudlin, MD;  Location: [facility] ORS;  Service: Orthopedics;  Laterality: Left;   VIDEO BRONCHOSCOPY N/A 09/21/2020   Procedure: VIDEO BRONCHOSCOPY WITHOUT FLUORO;  Surgeon: Freddi Starr, MD;  Location: [facility] ENDOSCOPY;  Service: Pulmonary;  Laterality: N/A;   VIDEO BRONCHOSCOPY WITH ENDOBRONCHIAL NAVIGATION Right 03/07/2021   Procedure: VIDEO BRONCHOSCOPY WITH ENDOBRONCHIAL NAVIGATION;  Surgeon: Collene Gobble, MD;  Location: [facility] ENDOSCOPY;  Service: Pulmonary;  Laterality: Right;   VIDEO BRONCHOSCOPY WITH RADIAL ENDOBRONCHIAL ULTRASOUND  11/14/2021   Procedure: VIDEO BRONCHOSCOPY WITH RADIAL ENDOBRONCHIAL ULTRASOUND;  Surgeon: Collene Gobble, MD;  Location: [place] ENDOSCOPY;  Service: Pulmonary;;    Social History:  reports that he quit smoking about 20 years ago. His smoking use included cigarettes. He has never used smokeless tobacco. He reports current alcohol use of about 3.0 standard drinks of alcohol per week. He reports that he does not use drugs.  Allergies: No Known Allergies  Family History:  Family History  Problem Relation Age of Onset   Diabetes Other    Stroke Other      Current Outpatient Medications:    allopurinol (ZYLOPRIM) 300 MG tablet, Take 1 tablet by mouth once daily, Disp: 90 tablet, Rfl: 0   amLODipine (NORVASC) 5 MG tablet, Take 1 tablet by mouth once daily, Disp: 90 tablet, Rfl: 0   furosemide (LASIX) 40 MG tablet, Take 1 tablet by mouth once daily, Disp: 90 tablet, Rfl: 3   potassium chloride SA (KLOR-CON M) 20 MEQ tablet, Take 1 tablet by mouth once daily, Disp: 90 tablet, Rfl: 3   predniSONE (DELTASONE) 20 MG tablet, Take 1 tablet (20 mg total) by mouth daily with breakfast., Disp: 90 tablet, Rfl: 0   tamsulosin (FLOMAX) 0.4 MG CAPS capsule, TAKE 1 CAPSULE BY MOUTH ONCE DAILY AFTER BREAKFAST, Disp: 90 capsule, Rfl: 0   traMADol (ULTRAM) 50 MG tablet, TAKE 1 TABLET BY MOUTH TWICE DAILY FOR  CHRONIC  PAIN, Disp: 60 tablet, Rfl: 0   traZODone (DESYREL) 50 MG tablet, TAKE 1 TABLET BY MOUTH AT BEDTIME AS NEEDED, Disp: 90 tablet, Rfl: 0   triamcinolone cream (KENALOG) 0.1 %, APPLY  CREAM EXTERNALLY TO AFFECTED AREA TWICE DAILY, Disp: 80 g, Rfl: 0    VITAMIN D PO, Take 1 tablet by mouth daily., Disp: , Rfl:    warfarin (COUMADIN) 5 MG tablet, TAKE 1/2 TABLET BY MOUTH  DAILY EXCEPT TAKE 1 TABLET ON MONDAYS OR AS  DIRECTED  BY  ANTICOAGULATION  CLINIC, Disp: 75 tablet, Rfl: 1  Review of Systems:  Negative unless indicated in HPI.   Physical Exam: Vitals:   10/26/22 1010 10/26/22 1023  BP: (!) 148/80 (!) 155/84  Pulse: 70   Temp: 97.6 F (36.4 C)   TempSrc: Oral   SpO2: 98%   Weight: 164 lb 12.8 oz (74.8 kg)   Height: '5\' 5"'$  (1.651 m)     Body mass index is 27.42 kg/m.   Physical Exam Vitals reviewed.  Constitutional:      General: He is not in acute distress.    Appearance: Normal appearance. He is not ill-appearing, toxic-appearing or diaphoretic.  HENT:     Head: Normocephalic.     Right Ear: Tympanic membrane, ear canal and external ear normal. There is no impacted cerumen.  Left Ear: Tympanic membrane, ear canal and external ear normal. There is no impacted cerumen.     Nose: Nose normal.     Mouth/Throat:     Mouth: Mucous membranes are moist.     Pharynx: Oropharynx is clear. No oropharyngeal exudate or posterior oropharyngeal erythema.  Eyes:     General: No scleral icterus.       Right eye: No discharge.        Left eye: No discharge.     Conjunctiva/sclera: Conjunctivae normal.     Pupils: Pupils are equal, round, and reactive to light.  Neck:     Vascular: No carotid bruit.  Cardiovascular:     Rate and Rhythm: Normal rate. Rhythm irregular.     Pulses: Normal pulses.     Heart sounds: Normal heart sounds.  Pulmonary:     Effort: Pulmonary effort is normal. No respiratory distress.     Breath sounds: Normal breath sounds.  Abdominal:     General: Abdomen is flat. Bowel sounds are normal.     Palpations: Abdomen is soft.  Musculoskeletal:        General: Normal range of motion.     Cervical back: Normal range of motion.  Skin:    General: Skin is warm and dry.  Neurological:     General: No  focal deficit present.     Mental Status: He is alert and oriented to person, place, and time. Mental status is at baseline.  Psychiatric:        Mood and Affect: Mood normal.        Behavior: Behavior normal.        Thought Content: Thought content normal.        Judgment: Judgment normal.      Subsequent Medicare wellness visit   1. Risk factors, based on past  M,S,F - Cardiac Risk Factors include: advanced age (>47mn, >>76women);hypertension;male gender   2.  Physical activities: Dietary issues and exercise activities discussed:  Current Exercise Habits: The patient does not participate in regular exercise at present, Exercise limited by: orthopedic condition(s)   3.  Depression/mood:  [place] [facility]        4.  ADL's:    10/26/2022   10:08 AM  In your present state of health, do you have any difficulty performing the following activities:  Hearing? 1  Vision? 0  Difficulty concentrating or making decisions? 0  Walking or climbing stairs? 1  Comment right knee pain  Dressing or bathing? 0  Doing errands, shopping? 0  Preparing Food and eating ? N  Using the Toilet? N  In the past six months, have you accidently leaked urine? N  Do you have problems with loss of bowel control? N  Managing your Medications? N  Managing your Finances? N  Housekeeping or managing your Housekeeping? N     5.  Fall risk:     06/09/2021   10:44 AM 10/25/2021    9:51 AM 11/10/2021    8:23 AM 11/14/2021    6:43 AM 10/26/2022   10:12 AM  Fall Risk  Falls in the past year?  0   1  Was there an injury with Fall?  0   0  Fall Risk Category Calculator  0   1  Fall Risk Category (Retired)  Low     (RETIRED) Patient Fall Risk Level Low fall risk  High fall risk Moderate fall risk   Fall risk Follow up  Falls evaluation completed   Falls evaluation completed     6.  Home safety: No problems  identified   7.  Height weight, and visual acuity: height and weight as above, vision/hearing: Vision Screening   Right eye Left eye Both eyes  Without correction     With correction '20/25 20/25 20/25 '$     8.  Counseling: Counseling given: Not Answered    9. Lab orders based on risk factors: Laboratory update will be reviewed   10. Cognitive assessment:        10/26/2022   10:13 AM  6CIT Screen  What Year? 0 points  What month? 0 points  What time? 0 points  Count back from 20 0 points  Months in reverse 0 points  Repeat phrase 0 points  Total Score 0 points     11. Screening: Patient provided with a written and personalized 5-10 year screening schedule in the AVS. Health Maintenance  Topic Date Due   COVID-19 Vaccine (5 - 2023-24 season) 11/11/2022*   Zoster (Shingles) Vaccine (1 of 2) 01/24/2023*   Medicare Annual Wellness Visit  10/26/2023   DTaP/Tdap/Td vaccine (3 - Tdap) 06/10/2024   Pneumonia Vaccine  Completed   Flu Shot  Completed   HPV Vaccine  Aged Out  *Topic was postponed. The date shown is not the original due date.    12. Provider List Update: Patient Care Team    Relationship Specialty Notifications Start End  [name], Jared Halsted, MD PCP - General Internal Medicine  07/18/18   Jared Perla, MD PCP - Cardiology Cardiology  05/18/21      13. Advance Directives: Does Patient Have a Medical Advance Directive?: Yes Type of Advance Directive: Out of facility DNR (pink MOST or yellow form), Living will, Healthcare Power of Attorney  14. Opioids: Patient is on chronic opioids. He has a signed pain contract with me. Has not displayed any signs of an opioid-use disorder.   15.   Goals      Protect My Health     Timeframe:  Long-Range Goal Priority:  Medium Start Date:                             Expected End Date:                       Follow Up Date 02/29/2025    - schedule and keep appointment for annual check-up    Why is this  important?   Screening tests can find diseases early when they are easier to treat.  Your doctor or nurse will talk with you about which tests are important for you.  Getting shots for common diseases like the flu and shingles will help prevent them.     Notes:          I have personally reviewed and noted the following in the patient's chart:   Medical and social history Use of alcohol, tobacco or illicit drugs  Current medications and supplements Functional ability and status Nutritional status Physical activity Advanced directives List of other physicians Hospitalizations, surgeries, and ER visits in previous 12 months Vitals Screenings to include cognitive, depression, and falls Referrals and appointments  In addition, I have reviewed and discussed with patient certain preventive protocols, quality metrics, and best practice recommendations. A written personalized care plan for  preventive services as well as general preventive health recommendations were provided to patient.  Impression and Plan:  Essential hypertension - Plan: CBC with Differential/Platelet, Comprehensive metabolic panel, Lipid panel, TSH  Routine general medical examination at a health care facility  Hypokalemia  Vitamin D deficiency - Plan: Vitamin D, 25-hydroxy  Vitamin B12 deficiency - Plan: Vitamin B12  Encounter for preventive health examination  Medicare annual wellness visit, subsequent - Plan: PSA  -Recommend routine eye and dental care. -Healthy lifestyle discussed in detail. -Labs to be updated today. -Prostate cancer screening: Declines due to age and comorbidities Health Maintenance  Topic Date Due   COVID-19 Vaccine (5 - 2023-24 season) 11/11/2022*   Zoster (Shingles) Vaccine (1 of 2) 01/24/2023*   Medicare Annual Wellness Visit  10/26/2023   DTaP/Tdap/Td vaccine (3 - Tdap) 06/10/2024   Pneumonia Vaccine  Completed   Flu Shot  Completed   HPV Vaccine  Aged Out  *Topic was  postponed. The date shown is not the original due date.   -We have agreed to start him on hydrocodone 5/325 mg to take 1 tablet every 12 hours as needed for pain due to his chronic pain syndrome.  Pain contract has been signed today.  He will be given 60 tablets a month x 3 months.      Jared Frohlich, MD [name]

## 2022-10-30 ENCOUNTER — Encounter: Payer: Self-pay | Admitting: Internal Medicine

## 2022-10-30 DIAGNOSIS — R972 Elevated prostate specific antigen [PSA]: Secondary | ICD-10-CM | POA: Insufficient documentation

## 2022-10-31 DIAGNOSIS — R338 Other retention of urine: Secondary | ICD-10-CM | POA: Diagnosis not present

## 2022-11-02 ENCOUNTER — Telehealth: Payer: Self-pay

## 2022-11-02 ENCOUNTER — Ambulatory Visit: Payer: Medicare Other | Admitting: Pulmonary Disease

## 2022-11-02 ENCOUNTER — Encounter: Payer: Self-pay | Admitting: Pulmonary Disease

## 2022-11-02 VITALS — BP 126/74 | HR 82 | Ht 65.0 in | Wt 161.0 lb

## 2022-11-02 DIAGNOSIS — J679 Hypersensitivity pneumonitis due to unspecified organic dust: Secondary | ICD-10-CM

## 2022-11-02 NOTE — Patient Instructions (Addendum)
I am glad you are feeling better!  Continue your daily prednisone '10mg'$  daily  Follow up in 6 months after CT Chest scan

## 2022-11-02 NOTE — Telephone Encounter (Signed)
Pt called to report he has seen [facility], Dr. Louis Meckel, and discussed the potential of pt being able to self-cath. Pt currently has a Foley due to a fall in the past and enlarged prostate. Pt reports urology wants to hold warfarin for one week prior to testing self cath. Advised pt he would most likely need a lovenox bridge for this. Advised this nurse will contact [facility] to obtain further information and also discuss with his PCP. Pt verbalized understanding.   Contacted RN, Lovey Newcomer, at [facility]. She reports Dr. Louis Meckel recommends if pt would like to try to self cath to stop his warfarin for one week due to risk of bleeding. She reports the risk of bleeding will be highest when he is learning to self cath but bleeding risk should decrease after he learns proper cath technique. Sandy reports Dr. Louis Meckel did not say pt had to self cath, this decision should be made by the pt and PCP, , explaining risk and benefit. Advised that pt would need a lovenox bridge if warfarin was held. Lovey Newcomer reports she will let Dr. Louis Meckel know and then inform [facility]in clinic with any further reports from Dr. Louis Meckel. Advised this nurse will f/u with PCP and pt and discuss risk vs benefit and f/u with [facility]. Advised if any changes to contact coumadin clinic. Provided direct number to coumadin clinic. Sandy verbalized understanding.

## 2022-11-02 NOTE — Progress Notes (Signed)
Synopsis: Referred in 08/2020 for hemoptysis   Subjective:   PATIENT ID: [name]: male DOB: [birth year], MRN: [id number]  HPI  Chief Complaint  Patient presents with   Follow-up    3 mo f/u. States he has been doing well since last visit. Has notice anymore blood in his phlegm since December 2023.    [name] is an 87 year old male, former smoker with aortic valve replacement and atrial fibrillation on coumadin who returns to pulmonary clinic for NSCLC s/p radiation 9/22, hemoptysis and hypersensitivity pneumonitis.   He completed steroid taper middle of February and he reports resolution of his hemoptysis. He has dyspnea when he is bending over and with exertional activity. Otherwise feels well.  OV 08/04/22 He is feeling much better since starting the steroid taper and starting bactrim antibiotic prophylaxis. He was on 60mg  daily for 1 month and tapered down to 40mg  2 days ago. He denies any hemoptysis.   OV 07/05/22 He had repeat bronchoscopy 11/14/21. He was seen in acute visit by Dr. Ander Slade 12/02/21 with review of bronchoscopy results. No malignant cells noted from biopsies. He was given breo ellipta at that time and then stopped it. He saw Rexene Edison, NP 06/23/22 where x-ray showed progressive interstitial infiltrates. 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   His shortness of breath is significantly improved at this time after being treated with 20mg  of prednisone for a week and course of augmentin.  OV 10/12/21 He was treated for pneumonia in December after visit with Eric Form, NP with augmentin. He does not report much improvement after the antibiotic. He is experiencing increasing shortness of breath.  He continues to cough up blood on a daily basis despite lowering his INR goal. He has lost 6lbs since we met 1 year ago.  He underwent navigational bronchoscopy  03/07/21 for the RUL lesion and RLL lesion with the RUL lesion positive for non-small cancer and no malignancy noted of the RLL lesion, only inflammation with benign reparative changes noted. He completed SBRT to the RUL nodule 04/2021.   Past Medical History:  Diagnosis Date   Aortic stenosis    Atrial fibrillation (HCC)    Baker cyst    Left knee   DJD (degenerative joint disease)    knees   Dysrhythmia    Eczema    Erectile dysfunction    Gout    History of hiatal hernia    History of radiation therapy    Right lung- 04/21/21-05/03/21 Dr. Gery Pray   HTN (hypertension)    MVP (mitral valve prolapse)    Shortness of breath dyspnea    with activity     Family History  Problem Relation Age of Onset   Diabetes Other    Stroke Other      Social History   Socioeconomic History   Marital status: Divorced    Spouse name: Not on file   Number of children: Not on file   Years of education: Not on file   Highest education level: Not on file  Occupational History   Not on file  Tobacco Use   Smoking status: Former    Years: 30.00    Types: Cigarettes    Quit date: 08/28/2002    Years since quitting: 20.1   Smokeless tobacco: Never  Vaping Use   Vaping Use: Never used  Substance and Sexual Activity  Alcohol use: Yes    Alcohol/week: 3.0 standard drinks of alcohol    Types: 3 Shots of liquor per week    Comment: social   Drug use: No   Sexual activity: Not on file  Other Topics Concern   Not on file  Social History Narrative   Not on file   Social Determinants of Health   Financial Resource Strain: Low Risk  (10/26/2022)   Overall Financial Resource Strain (CARDIA)    Difficulty of Paying Living Expenses: Not hard at all  Food Insecurity: No Food Insecurity (10/26/2022)   Hunger Vital Sign    Worried About Running Out of Food in the Last Year: Never true    Ran Out of Food in the Last Year: Never true  Transportation Needs: No Transportation Needs (10/26/2022)    PRAPARE - Hydrologist (Medical): No    Lack of Transportation (Non-Medical): No  Physical Activity: Insufficiently Active (10/26/2022)   Exercise Vital Sign    Days of Exercise per Week: 3 days    Minutes of Exercise per Session: 30 min  Stress: No Stress Concern Present (10/26/2022)   [place]    Feeling of Stress : Not at all  Social Connections: Moderately Isolated (10/26/2022)   Social Connection and Isolation Panel [NHANES]    Frequency of Communication with Friends and Family: More than three times a week    Frequency of Social Gatherings with Friends and Family: Once a week    Attends Religious Services: More than 4 times per year    Active Member of [employer] or Organizations: No    Attends Archivist Meetings: Never    Marital Status: Divorced  Human resources officer Violence: Not At Risk (10/26/2022)   Humiliation, Afraid, Rape, and Kick questionnaire    Fear of Current or Ex-Partner: No    Emotionally Abused: No    Physically Abused: No    Sexually Abused: No     No Known Allergies   Outpatient Medications Prior to Visit  Medication Sig Dispense Refill   allopurinol (ZYLOPRIM) 300 MG tablet Take 1 tablet by mouth once daily 90 tablet 0   amLODipine (NORVASC) 5 MG tablet Take 1 tablet by mouth once daily 90 tablet 0   furosemide (LASIX) 40 MG tablet Take 1 tablet by mouth once daily 90 tablet 3   HYDROcodone-acetaminophen (NORCO) 5-325 MG tablet Take 1 tablet by mouth every 12 (twelve) hours as needed for moderate pain. 60 tablet 0   HYDROcodone-acetaminophen (NORCO) 5-325 MG tablet Take 1 tablet by mouth every 12 (twelve) hours as needed for moderate pain. 60 tablet 0   HYDROcodone-acetaminophen (NORCO) 5-325 MG tablet Take 1 tablet by mouth every 12 (twelve) hours as needed for moderate pain. 60 tablet 0   potassium chloride SA (KLOR-CON M) 20 MEQ tablet Take 1 tablet by  mouth once daily 90 tablet 3   predniSONE (DELTASONE) 20 MG tablet Take 1 tablet (20 mg total) by mouth daily with breakfast. 90 tablet 0   tamsulosin (FLOMAX) 0.4 MG CAPS capsule TAKE 1 CAPSULE BY MOUTH ONCE DAILY AFTER BREAKFAST 90 capsule 0   traMADol (ULTRAM) 50 MG tablet TAKE 1 TABLET BY MOUTH TWICE DAILY FOR  CHRONIC  PAIN 60 tablet 0   traZODone (DESYREL) 50 MG tablet TAKE 1 TABLET BY MOUTH AT BEDTIME AS NEEDED 90 tablet 0   triamcinolone cream (KENALOG) 0.1 % APPLY  CREAM EXTERNALLY TO  AFFECTED AREA TWICE DAILY 80 g 0   VITAMIN D PO Take 1 tablet by mouth daily.     warfarin (COUMADIN) 5 MG tablet TAKE 1/2 TABLET BY MOUTH  DAILY EXCEPT TAKE 1 TABLET ON MONDAYS OR AS  DIRECTED  BY  ANTICOAGULATION  CLINIC 75 tablet 1   No facility-administered medications prior to visit.   Review of Systems  Constitutional:  Negative for chills, fever, malaise/fatigue and weight loss.  HENT:  Negative for nosebleeds.   Eyes: Negative.   Respiratory:  Positive for shortness of breath. Negative for cough, hemoptysis, sputum production and wheezing.   Cardiovascular:  Negative for chest pain, palpitations, orthopnea, claudication, leg swelling and PND.  Gastrointestinal:  Negative for abdominal pain, blood in stool, heartburn, nausea and vomiting.  Genitourinary:  Negative for hematuria.  Musculoskeletal:  Negative for joint pain.  Skin:  Negative for rash.  Neurological:  Negative for dizziness, weakness and headaches.  Endo/Heme/Allergies:  Does not bruise/bleed easily.  Psychiatric/Behavioral: Negative.     Objective:   Vitals:   11/02/22 1105  BP: 126/74  Pulse: 82  SpO2: 98%  Weight: 161 lb (73 kg)  Height: 5\' 5"  (1.651 m)   Physical Exam Constitutional:      Appearance: Normal appearance. He is normal weight.  HENT:     Head: Normocephalic and atraumatic.     Nose: Nose normal. No congestion or rhinorrhea.  Eyes:     Conjunctiva/sclera: Conjunctivae normal.  Cardiovascular:      Rate and Rhythm: Normal rate. Rhythm irregular.     Pulses: Normal pulses.     Heart sounds: Normal heart sounds.  Pulmonary:     Effort: Pulmonary effort is normal.     Breath sounds: No wheezing, rhonchi or rales.  Musculoskeletal:     Right lower leg: No edema.     Left lower leg: No edema.  Skin:    General: Skin is warm and dry.  Neurological:     General: No focal deficit present.     Mental Status: He is alert.    CBC    Component Value Date/Time   WBC 7.1 10/26/2022 1051   RBC 3.75 (L) 10/26/2022 1051   HGB 12.5 (L) 10/26/2022 1051   HGB 12.8 (L) 06/07/2022 0939   HCT 37.2 (L) 10/26/2022 1051   HCT 37.2 (L) 06/07/2022 0939   PLT 257.0 10/26/2022 1051   PLT 204 06/07/2022 0939   MCV 99.3 10/26/2022 1051   MCV 95 06/07/2022 0939   MCH 32.7 06/07/2022 0939   MCH 33.1 03/07/2021 0649   MCHC 33.6 10/26/2022 1051   RDW 14.8 10/26/2022 1051   RDW 12.6 06/07/2022 0939   LYMPHSABS 1.3 10/26/2022 1051   LYMPHSABS 1.1 07/09/2017 1229   MONOABS 0.7 10/26/2022 1051   EOSABS 0.1 10/26/2022 1051   EOSABS 0.0 07/09/2017 1229   BASOSABS 0.1 10/26/2022 1051   BASOSABS 0.0 07/09/2017 1229      Latest Ref Rng & Units 10/26/2022   10:51 AM 06/16/2022   11:26 AM 06/07/2022    9:39 AM  BMP  Glucose 70 - 99 mg/dL 70 - 99 mg/dL 80    80  85  90   BUN 6 - 23 mg/dL 6 - 23 mg/dL 17    17  20  18    Creatinine 0.40 - 1.50 mg/dL 0.40 - 1.50 mg/dL 1.21    1.21  1.06  1.12   BUN/Creat Ratio 10 - 24  19  16  Sodium 135 - 145 mEq/L 135 - 145 mEq/L 142    142  142  141   Potassium 3.5 - 5.1 mEq/L 3.5 - 5.1 mEq/L 3.9    3.9  3.9  3.7   Chloride 96 - 112 mEq/L 96 - 112 mEq/L 101    101  102  103   CO2 19 - 32 mEq/L 19 - 32 mEq/L 32    32  26  27   Calcium 8.4 - 10.5 mg/dL 8.4 - 10.5 mg/dL 9.2    9.2  9.3  8.8    Chest imaging: CT Chest 06/29/22 1. Evolving post treatment changes about the RIGHT apical lesion with contracting post radiation fibrosis. 2. Waxing and waning  RIGHT basilar consolidation now with new LEFT basilar consolidation. Findings could be attributed to basilar pneumonia or sequela of recurrent aspiration. 3. Background of worsening ground-glass and septal thickening throughout the LEFT greater than RIGHT chest. Findings are atypical given lack of subpleural sparing for pulmonary hemorrhage but would correlate with INR and any other ongoing anticoagulation. Sequela of pneumonitis or background developing interstitial lung disease is considered. Would correlate with any drug related or other risk factors for worsening pneumonitis. 4. Background pulmonary emphysema as before. 5. Small LEFT pleural effusion is slightly enlarged compared to previous imaging. New RIGHT-sided pleural effusion. 6. Engorgement of central pulmonary vasculature to 3.5 cm, can be seen in the setting of pulmonary arterial hypertension. 7. Aortic atherosclerosis.  CT Chest wo contrast 10/07/21 1. Evolving postradiation fibrosis at the site of the treated right upper lobe neoplasm. 2. Worsening patchy ground-glass attenuation and septal thickening throughout the lungs bilaterally. This is nonspecific, but has been chronic and intermittent over the past year, but most severe on today's examination. It is unlikely that these regions are within the radiation treatment window. Clinical correlation for signs of worsening chronic atypical infection is suggested. 3. Persistent mass-like area in the medial aspect of the right lower lobe. This demonstrated low-level hypermetabolism on prior PET-CT, which is nonspecific. The possibility of slow-growing neoplasm such as adenocarcinoma could be considered, and in the appropriate clinical setting could account for the other parenchymal changes in the lungs if it was associated with aerogenous spread of disease. Tissue sampling of this area should be considered if not already obtained. 4. Aortic atherosclerosis, in addition to  left main and three-vessel coronary artery disease. 5. Diffuse bronchial wall thickening with mild centrilobular and paraseptal emphysema; imaging findings suggestive of underlying COPD.  CXR 08/08/21 Left greater than right lower lobe pneumonia. Possible small associated pleural effusion(s).  PET Scan 03/31/21 1. Low level FDG uptake in the small sub solid right upper lobe lesions. Recommend correlation with biopsy results. 2. Low level FDG uptake in the right medial lower lobe process. No discrete mass or focal area hypermetabolism. 3. No enlarged or hypermetabolic mediastinal or hilar lymph nodes or evidence of metastatic disease elsewhere. 4. Advanced vascular disease.  CXR 08/06/20 Patchy bibasilar opacities. No pneumothorax or pleural effusion. Stable cardiomegaly and postsurgical appearance of the cardiomediastinal silhouette. Multilevel spondylosis.  CTA Chest 05/2008 Mild emphysematous changes noted in upper lobes.  PFT:     No data to display         Echo: 2019 - Left ventricle: The cavity size was normal. Wall thickness was    increased in a pattern of mild LVH. Indeterminant diastolic    function (atrial fibrillation). Systolic function was normal. The    estimated ejection fraction was in the  range of 55% to 60%. Wall    motion was normal; there were no regional wall motion    abnormalities.  - Aortic valve: Bioprosthetic aortic valve. The bioprosthetic    aortic valve appears to function normally. There was no    significant regurgitation. Mean gradient (S): 13 mm Hg. Valve    area (VTI): 1.37 cm^2.  - Aorta: Status post aortic root replacement.  - Mitral valve: Mildly to moderately calcified annulus. There was    trivial regurgitation. Valve area by continuity equation (using    LVOT flow): 2.27 cm^2.  - Left atrium: The atrium was moderately to severely dilated.  - Right ventricle: The cavity size was mildly dilated. Systolic    function was mildly  reduced.  - Right atrium: The atrium was moderately to severely dilated.  - Tricuspid valve: Peak RV-RA gradient (S): 33 mm Hg.  - Pulmonary arteries: PA peak pressure: 36 mm Hg (S).   Assessment & Plan:   No diagnosis found.  Discussion: Jaykwon Morones is an 87 year old male, former smoker with aortic valve replacement and atrial fibrillation on coumadin who returns to pulmonary clinic for NSCLC s/p radiation 9/22, hemoptysis and hypersensitivity pneumonitis.   He completed his steroid taper a bit earlier than planned but his hemoptysis has resolved. He overall feels well but has some exertional dyspnea.   He is back to his baseline 10mg  of prednisone per day for his RA.   We will check a CT Chest scan in 6 months.   Follow up in 6 months.   Jared Jackson, MD [facility] Office: [phone]    Current Outpatient Medications:    allopurinol (ZYLOPRIM) 300 MG tablet, Take 1 tablet by mouth once daily, Disp: 90 tablet, Rfl: 0   amLODipine (NORVASC) 5 MG tablet, Take 1 tablet by mouth once daily, Disp: 90 tablet, Rfl: 0   furosemide (LASIX) 40 MG tablet, Take 1 tablet by mouth once daily, Disp: 90 tablet, Rfl: 3   HYDROcodone-acetaminophen (NORCO) 5-325 MG tablet, Take 1 tablet by mouth every 12 (twelve) hours as needed for moderate pain., Disp: 60 tablet, Rfl: 0   HYDROcodone-acetaminophen (NORCO) 5-325 MG tablet, Take 1 tablet by mouth every 12 (twelve) hours as needed for moderate pain., Disp: 60 tablet, Rfl: 0   HYDROcodone-acetaminophen (NORCO) 5-325 MG tablet, Take 1 tablet by mouth every 12 (twelve) hours as needed for moderate pain., Disp: 60 tablet, Rfl: 0   potassium chloride SA (KLOR-CON M) 20 MEQ tablet, Take 1 tablet by mouth once daily, Disp: 90 tablet, Rfl: 3   predniSONE (DELTASONE) 20 MG tablet, Take 1 tablet (20 mg total) by mouth daily with breakfast., Disp: 90 tablet, Rfl: 0   tamsulosin (FLOMAX) 0.4 MG CAPS capsule, TAKE 1 CAPSULE BY MOUTH  ONCE DAILY AFTER BREAKFAST, Disp: 90 capsule, Rfl: 0   traMADol (ULTRAM) 50 MG tablet, TAKE 1 TABLET BY MOUTH TWICE DAILY FOR  CHRONIC  PAIN, Disp: 60 tablet, Rfl: 0   traZODone (DESYREL) 50 MG tablet, TAKE 1 TABLET BY MOUTH AT BEDTIME AS NEEDED, Disp: 90 tablet, Rfl: 0   triamcinolone cream (KENALOG) 0.1 %, APPLY  CREAM EXTERNALLY TO AFFECTED AREA TWICE DAILY, Disp: 80 g, Rfl: 0   VITAMIN D PO, Take 1 tablet by mouth daily., Disp: , Rfl:    warfarin (COUMADIN) 5 MG tablet, TAKE 1/2 TABLET BY MOUTH  DAILY EXCEPT TAKE 1 TABLET ON MONDAYS OR AS  DIRECTED  BY  ANTICOAGULATION  CLINIC, Disp: 75 tablet, Rfl: 1

## 2022-11-02 NOTE — Telephone Encounter (Signed)
Patient would like for you to call  him on Friday - [phone]

## 2022-11-03 NOTE — Telephone Encounter (Signed)
Pt reports he has a f/u apt with [facility] on 3/12 and will find out more information about what they are requesting with his warfarin and why he has to stop it to learn to self cath. Pt reports he has already self cathed 3 or 4 times in their office before but there was bleeding so they they stopped him and placed a Foley cath.  Pt is not sure if he wants to proceed with stopping warfarin. Advised pt of risk of stopping. Pt verbalized understanding.  Pt has coumadin clinic apt on 3/12 also and will update at that time.

## 2022-11-03 NOTE — Telephone Encounter (Signed)
LVM

## 2022-11-07 ENCOUNTER — Ambulatory Visit (INDEPENDENT_AMBULATORY_CARE_PROVIDER_SITE_OTHER): Payer: Medicare Other

## 2022-11-07 DIAGNOSIS — Z7901 Long term (current) use of anticoagulants: Secondary | ICD-10-CM

## 2022-11-07 LAB — POCT INR: INR: 4 — AB (ref 2.0–3.0)

## 2022-11-07 NOTE — Patient Instructions (Addendum)
Pre visit review using our clinic review tool, if applicable. No additional management support is needed unless otherwise documented below in the visit note.  Hold dose tomorrow and hold dose the day after tomorrow and then change weekly dose to take 1/2 tablet daily. Recheck in 2 weeks.

## 2022-11-07 NOTE — Progress Notes (Cosign Needed)
Pt also taking prednisone 10 mg QD. Pt denies any other changes. Pt already took dose today.  Hold dose tomorrow and hold dose the day after tomorrow and then change weekly dose to take 1/2 tablet daily. Recheck in 2 weeks.

## 2022-11-08 NOTE — Telephone Encounter (Signed)
Received a VM from [place] at [facility] who reports Dr. Louis Meckel is ok with the pt being on a lovenox bridge for his surgery.  Pt is not having surgery, and he wants pt to stop warfarin to teach self cathing and reduce any bleeding risk.   LVM for Caryl Pina to call back to clarify.

## 2022-11-10 DIAGNOSIS — R338 Other retention of urine: Secondary | ICD-10-CM | POA: Diagnosis not present

## 2022-11-13 ENCOUNTER — Other Ambulatory Visit: Payer: Self-pay | Admitting: Cardiology

## 2022-11-13 ENCOUNTER — Other Ambulatory Visit: Payer: Self-pay | Admitting: Internal Medicine

## 2022-11-13 DIAGNOSIS — R338 Other retention of urine: Secondary | ICD-10-CM | POA: Diagnosis not present

## 2022-11-14 ENCOUNTER — Other Ambulatory Visit: Payer: Self-pay | Admitting: Urology

## 2022-11-14 DIAGNOSIS — R339 Retention of urine, unspecified: Secondary | ICD-10-CM

## 2022-11-15 ENCOUNTER — Encounter: Payer: Self-pay | Admitting: Pulmonary Disease

## 2022-11-17 ENCOUNTER — Other Ambulatory Visit: Payer: Self-pay | Admitting: Interventional Radiology

## 2022-11-17 ENCOUNTER — Ambulatory Visit
Admission: RE | Admit: 2022-11-17 | Discharge: 2022-11-17 | Disposition: A | Discharge status: Home / Self Care | Payer: Medicare Other | Source: Ambulatory Visit | Attending: Urology | Admitting: Urology

## 2022-11-17 DIAGNOSIS — R339 Retention of urine, unspecified: Secondary | ICD-10-CM

## 2022-11-17 HISTORY — PX: IR RADIOLOGIST EVAL & MGMT: IMG5224

## 2022-11-17 NOTE — Progress Notes (Signed)
Chief Complaint: Patient was seen in consultation today for benign prostatic hyperplasia  Referring Physician(s): Herrick,Benjamin W  History of Present Illness: [name] is a 87 y.o. male with history benign prostatic hyperplasia with lower urinary tract symptoms.  He presents today for evaluation for prostate artery embolization at the gracious referral by Dr. Louis Meckel.  He states that in January of this year he began to have acute urinary retention.  He attempted self catheterization for a short time but was having problems with urethral bleeding.  Therefore, he had an indwelling Foley catheter placed which has remained.  No hematuria since placement.  He tried tamsulosin which did not improve his symptoms.  Prior to Foley placement, he endorses incomplete emptying, frequency, intermittency, weak stream, and straining, all severe.  No history of prostate cancer.  He is on coumadin for porcine aortic valve replacement and atrial fibrillation.  He has experience bridging to lovenox under the supervision of his coumadin clinic RN, which he has done many times.  He is no longer sexually active.      Past Medical History:  Diagnosis Date   Aortic stenosis    Atrial fibrillation (HCC)    Baker cyst    Left knee   DJD (degenerative joint disease)    knees   Dysrhythmia    Eczema    Erectile dysfunction    Gout    History of hiatal hernia    History of radiation therapy    Right lung- 04/21/21-05/03/21 Dr. Gery Pray   HTN (hypertension)    MVP (mitral valve prolapse)    Shortness of breath dyspnea    with activity    Past Surgical History:  Procedure Laterality Date    polyps vocal cord  1994   AORTIC VALVE REPLACEMENT  ~2004   BRONCHIAL BIOPSY  03/07/2021   Procedure: BRONCHIAL BIOPSIES;  Surgeon: Collene Gobble, MD;  Location: [facility] ENDOSCOPY;  Service: Pulmonary;;   BRONCHIAL BIOPSY  11/14/2021   Procedure: BRONCHIAL BIOPSIES;  Surgeon: Collene Gobble, MD;  Location:  [facility] ENDOSCOPY;  Service: Pulmonary;;   BRONCHIAL BRUSHINGS  03/07/2021   Procedure: BRONCHIAL BRUSHINGS;  Surgeon: Collene Gobble, MD;  Location: [facility] ENDOSCOPY;  Service: Pulmonary;;   BRONCHIAL BRUSHINGS  11/14/2021   Procedure: BRONCHIAL BRUSHINGS;  Surgeon: Collene Gobble, MD;  Location: [facility] ENDOSCOPY;  Service: Pulmonary;;   BRONCHIAL NEEDLE ASPIRATION BIOPSY  03/07/2021   Procedure: BRONCHIAL NEEDLE ASPIRATION BIOPSIES;  Surgeon: Collene Gobble, MD;  Location: [facility] ENDOSCOPY;  Service: Pulmonary;;   BRONCHIAL NEEDLE ASPIRATION BIOPSY  11/14/2021   Procedure: BRONCHIAL NEEDLE ASPIRATION BIOPSIES;  Surgeon: Collene Gobble, MD;  Location: [facility] ENDOSCOPY;  Service: Pulmonary;;   BRONCHIAL WASHINGS  09/21/2020   Procedure: BRONCHIAL WASHINGS;  Surgeon: Freddi Starr, MD;  Location: [facility] ENDOSCOPY;  Service: Pulmonary;;  BAL    BRONCHIAL WASHINGS  03/07/2021   Procedure: BRONCHIAL WASHINGS;  Surgeon: Collene Gobble, MD;  Location: [facility] ENDOSCOPY;  Service: Pulmonary;;   BRONCHIAL WASHINGS  11/14/2021   Procedure: BRONCHIAL WASHINGS;  Surgeon: Collene Gobble, MD;  Location: [place];  Service: Pulmonary;;   FIDUCIAL MARKER PLACEMENT  03/07/2021   Procedure: FIDUCIAL MARKER PLACEMENT;  Surgeon: Collene Gobble, MD;  Location: [facility] ENDOSCOPY;  Service: Pulmonary;;   HERNIA REPAIR  ~1978   HERNIA REPAIR  ~1983   LUMBAR LAMINECTOMY/DECOMPRESSION MICRODISCECTOMY N/A 01/15/2015   Procedure: 2 LEVEL DECOMPRESSIVE LUMBAR LAMINECTOMY L3-L4,L4-L5;  Surgeon: Latanya Maudlin, MD;  Location: [facility] ORS;  Service: Orthopedics;  Laterality: N/A;   LUMBAR LAMINECTOMY/DECOMPRESSION MICRODISCECTOMY N/A 05/01/2017   Procedure: Lumbar one-Lumbar two, Lumbar two-Lumbar three, Lumbar three-Lumbar four Laminectomy; Evacuation of hematoma;  Surgeon: Consuella Lose, MD;  Location: [place];  Service: Neurosurgery;  Laterality: N/A;   right total hip  ~4 years ago   [place] Left ~2009   TOTAL KNEE ARTHROPLASTY Left  04/14/2015   Procedure: LEFT TOTAL KNEE ARTHROPLASTY;  Surgeon: Latanya Maudlin, MD;  Location: [facility] ORS;  Service: Orthopedics;  Laterality: Left;   VIDEO BRONCHOSCOPY N/A 09/21/2020   Procedure: VIDEO BRONCHOSCOPY WITHOUT FLUORO;  Surgeon: Freddi Starr, MD;  Location: [facility] ENDOSCOPY;  Service: Pulmonary;  Laterality: N/A;   VIDEO BRONCHOSCOPY WITH ENDOBRONCHIAL NAVIGATION Right 03/07/2021   Procedure: VIDEO BRONCHOSCOPY WITH ENDOBRONCHIAL NAVIGATION;  Surgeon: Collene Gobble, MD;  Location: [facility] ENDOSCOPY;  Service: Pulmonary;  Laterality: Right;   VIDEO BRONCHOSCOPY WITH RADIAL ENDOBRONCHIAL ULTRASOUND  11/14/2021   Procedure: VIDEO BRONCHOSCOPY WITH RADIAL ENDOBRONCHIAL ULTRASOUND;  Surgeon: Collene Gobble, MD;  Location: [facility] ENDOSCOPY;  Service: Pulmonary;;    Allergies: Patient has no known allergies.  Medications: Prior to Admission medications   Medication Sig Start Date End Date Taking? Authorizing Provider  allopurinol (ZYLOPRIM) 300 MG tablet Take 1 tablet by mouth once daily 11/13/22   Isaac Bliss, Rayford Halsted, MD  amLODipine [facility]) 5 MG tablet Take 1 tablet by mouth once daily 11/13/22   Isaac Bliss, Rayford Halsted, MD  furosemide (LASIX) 40 MG tablet Take 1 tablet by mouth once daily 08/14/22   Lelon Perla, MD  HYDROcodone-acetaminophen (NORCO) 5-325 MG tablet Take 1 tablet by mouth every 12 (twelve) hours as needed for moderate pain. 10/26/22   Isaac Bliss, Rayford Halsted, MD  HYDROcodone-acetaminophen (NORCO) 5-325 MG tablet Take 1 tablet by mouth every 12 (twelve) hours as needed for moderate pain. 10/26/22   Isaac Bliss, Rayford Halsted, MD  HYDROcodone-acetaminophen (NORCO) 5-325 MG tablet Take 1 tablet by mouth every 12 (twelve) hours as needed for moderate pain. 10/26/22   Isaac Bliss, Rayford Halsted, MD  potassium chloride SA (KLOR-CON M) 20 MEQ tablet Take 1 tablet by mouth once daily 11/14/22   Lelon Perla, MD  predniSONE (DELTASONE) 20 MG tablet Take 1 tablet (20 mg  total) by mouth daily with breakfast. 10/04/22   Isaac Bliss, Rayford Halsted, MD  tamsulosin (FLOMAX) 0.4 MG CAPS capsule TAKE 1 CAPSULE BY MOUTH ONCE DAILY AFTER BREAKFAST 09/25/22   Isaac Bliss, Rayford Halsted, MD  traMADol (ULTRAM) 50 MG tablet TAKE 1 TABLET BY MOUTH TWICE DAILY FOR  CHRONIC  PAIN 09/25/22   Isaac Bliss, Rayford Halsted, MD  traZODone (DESYREL) 50 MG tablet TAKE 1 TABLET BY MOUTH AT BEDTIME AS NEEDED 09/25/22   Isaac Bliss, Rayford Halsted, MD  triamcinolone cream (KENALOG) 0.1 % APPLY  CREAM EXTERNALLY TO AFFECTED AREA TWICE DAILY 05/22/22   Isaac Bliss, Rayford Halsted, MD  VITAMIN D PO Take 1 tablet by mouth daily.    [provider]  warfarin (COUMADIN) 5 MG tablet TAKE 1/2 TABLET BY MOUTH  DAILY EXCEPT TAKE 1 TABLET ON MONDAYS OR AS  DIRECTED  BY  ANTICOAGULATION  CLINIC 03/28/22   Isaac Bliss, Rayford Halsted, MD     Family History  Problem Relation Age of Onset   Diabetes Other    Stroke Other     Social History   Socioeconomic History   Marital status: Divorced    Spouse name: Not on file   Number  of children: Not on file   Years of education: Not on file   Highest education level: Not on file  Occupational History   Not on file  Tobacco Use   Smoking status: Former    Years: 30    Types: Cigarettes    Quit date: 08/28/2002    Years since quitting: 20.2   Smokeless tobacco: Never  Vaping Use   Vaping Use: Never used  Substance and Sexual Activity   Alcohol use: Yes    Alcohol/week: 3.0 standard drinks of alcohol    Types: 3 Shots of liquor per week    Comment: social   Drug use: No   Sexual activity: Not on file  Other Topics Concern   Not on file  Social History Narrative   Not on file   Social Determinants of Health   Financial Resource Strain: Low Risk  (10/26/2022)   Overall Financial Resource Strain (CARDIA)    Difficulty of Paying Living Expenses: Not hard at all  Food Insecurity: No Food Insecurity (10/26/2022)   Hunger Vital Sign    Worried  About Running Out of Food in the Last Year: Never true    Ran Out of Food in the Last Year: Never true  Transportation Needs: No Transportation Needs (10/26/2022)   PRAPARE - Hydrologist (Medical): No    Lack of Transportation (Non-Medical): No  Physical Activity: Insufficiently Active (10/26/2022)   Exercise Vital Sign    Days of Exercise per Week: 3 days    Minutes of Exercise per Session: 30 min  Stress: No Stress Concern Present (10/26/2022)   [place]    Feeling of Stress : Not at all  Social Connections: Moderately Isolated (10/26/2022)   Social Connection and Isolation Panel [NHANES]    Frequency of Communication with Friends and Family: More than three times a week    Frequency of Social Gatherings with Friends and Family: Once a week    Attends Religious Services: More than 4 times per year    Active Member of [employer] or Organizations: No    Attends Archivist Meetings: Never    Marital Status: Divorced    Review of Systems: A 12 point ROS discussed and pertinent positives are indicated in the HPI above.  All other systems are negative.   Vital Signs: There were no vitals taken for this visit.  Advance Care Plan: The advanced care plan/surrogate decision maker was discussed at the time of visit and documented in the medical record.    Physical Exam Constitutional:      General: He is not in acute distress. HENT:     Head: Normocephalic.     Mouth/Throat:     Mouth: Mucous membranes are moist.  Eyes:     General: Scleral icterus present.  Cardiovascular:     Rate and Rhythm: Normal rate. Rhythm irregular.  Pulmonary:     Breath sounds: Normal breath sounds.  Abdominal:     General: There is no distension.  Musculoskeletal:     Right lower leg: No edema.     Left lower leg: No edema.  Skin:    General: Skin is warm and dry.  Neurological:     Mental Status:  He is alert and oriented to person, place, and time.     Imaging: PET CT 03/31/21   4.7 x 4.1 x 4.5 = 45 g  Labs:  CBC: Recent Labs  06/07/22 0939 06/23/22 1149 10/26/22 1051  WBC 6.7 8.6 7.1  HGB 12.8* 13.0 12.5*  HCT 37.2* 39.5 37.2*  PLT 204 187.0 257.0    COAGS: Recent Labs    09/18/22 0000 10/03/22 0000 10/17/22 0000 11/07/22 0000  INR 1.4*  1.8* 1.4* 1.8* 4.0*    BMP: Recent Labs    06/07/22 0939 06/16/22 1126 10/26/22 1051  NA 141 142 142  142  K 3.7 3.9 3.9  3.9  CL 103 102 101  101  CO2 27 26 32  32  GLUCOSE 90 85 80  80  BUN 18 20 17  17   CALCIUM 8.8 9.3 9.2  9.2  CREATININE 1.12 1.06 1.21  1.21    LIVER FUNCTION TESTS: Recent Labs    10/26/22 1051  BILITOT 0.5  AST 19  ALT 10  ALKPHOS 51  PROT 5.9*  ALBUMIN 3.5    TUMOR MARKERS: PSA (10/26/22) - 6.23  Assessment and Plan: 87 year old male with history of benign prostatic hyperplasia and severe lower urinary tract symptoms requiring indwelling Foley catheter since January 2024.  Most recent imaging (03/31/21) demonstrates approximately 45 g prostate.  He is interested in pursuing minimally invasive option for BPH management given lack of response to medication and continuous anticoagulation requirement.  He is an excellent candidate for prostate artery embolization, and wishes to proceed with such.  -obtain CTA pelvis for procedural planning purposes and to establish baseline gland size -[name] will arrange lovenox bridge around the time of the procedure with his coumadin clinic - he does not need to hold anticoagulation for this procedure -plan for prostate artery embolization at [facility] -I will arrange follow up with Dr. Louis Meckel approximately 1 month after the procedure for voiding trail/possible catheter removal    Electronically Signed: Suzette Battiest, MD 11/17/2022, 9:09 AM   I spent a total of  60 Minutes  in face to face in clinical consultation,  greater than 50% of which was counseling/coordinating care for benign prostatic hyperplasia.

## 2022-11-21 ENCOUNTER — Other Ambulatory Visit: Payer: Self-pay | Admitting: Physician Assistant

## 2022-11-21 ENCOUNTER — Ambulatory Visit (INDEPENDENT_AMBULATORY_CARE_PROVIDER_SITE_OTHER): Payer: Medicare Other

## 2022-11-21 ENCOUNTER — Other Ambulatory Visit (HOSPITAL_COMMUNITY): Payer: Self-pay | Admitting: Interventional Radiology

## 2022-11-21 DIAGNOSIS — N400 Enlarged prostate without lower urinary tract symptoms: Secondary | ICD-10-CM

## 2022-11-21 DIAGNOSIS — Z7901 Long term (current) use of anticoagulants: Secondary | ICD-10-CM | POA: Diagnosis not present

## 2022-11-21 LAB — POCT INR: INR: 2 (ref 2.0–3.0)

## 2022-11-21 NOTE — Progress Notes (Signed)
Pt also taking prednisone 10 mg QD. Pt denies any other changes. Continue 1/2 tablet daily. Recheck in 4 weeks.

## 2022-11-21 NOTE — Patient Instructions (Addendum)
Pre visit review using our clinic review tool, if applicable. No additional management support is needed unless otherwise documented below in the visit note. ? ?Continue 1/2 tablet daily. Recheck in 4 weeks.  ?

## 2022-11-22 ENCOUNTER — Ambulatory Visit (HOSPITAL_BASED_OUTPATIENT_CLINIC_OR_DEPARTMENT_OTHER)
Admission: RE | Admit: 2022-11-22 | Discharge: 2022-11-22 | Disposition: A | Discharge status: Home / Self Care | Payer: Medicare Other | Source: Ambulatory Visit | Attending: Interventional Radiology | Admitting: Interventional Radiology

## 2022-11-22 ENCOUNTER — Encounter (HOSPITAL_BASED_OUTPATIENT_CLINIC_OR_DEPARTMENT_OTHER): Payer: Self-pay

## 2022-11-22 ENCOUNTER — Other Ambulatory Visit: Payer: Self-pay | Admitting: Physician Assistant

## 2022-11-22 DIAGNOSIS — R339 Retention of urine, unspecified: Secondary | ICD-10-CM | POA: Insufficient documentation

## 2022-11-22 DIAGNOSIS — K8020 Calculus of gallbladder without cholecystitis without obstruction: Secondary | ICD-10-CM | POA: Diagnosis not present

## 2022-11-22 MED ORDER — IOHEXOL 350 MG/ML SOLN
100.0000 mL | Freq: Once | INTRAVENOUS | Status: AC | PRN
  Administered 2022-11-22: 100 mL via INTRAVENOUS

## 2022-11-23 ENCOUNTER — Other Ambulatory Visit: Payer: Self-pay | Admitting: Physician Assistant

## 2022-11-23 ENCOUNTER — Telehealth: Payer: Self-pay | Admitting: Internal Medicine

## 2022-11-23 NOTE — Telephone Encounter (Signed)
Prescription Request  11/23/2022  LOV: 10/26/2022  What is the name of the medication or equipment? HYDROcodone-acetaminophen (NORCO) 5-325 MG tablet   Pt stated he only has 4 pills left.  Pt informed MD will be out until 11/28/22.  Have you contacted your pharmacy to request a refill? Yes   Which pharmacy would you like this sent to?  [place], [address] [place] [address] Phone: [phone] Fax: [phone]    Patient notified that their request is being sent to the clinical staff for review and that they should receive a response within 2 business days.   Please advise at Mobile [phone] (mobile)

## 2022-11-24 ENCOUNTER — Ambulatory Visit (HOSPITAL_BASED_OUTPATIENT_CLINIC_OR_DEPARTMENT_OTHER): Payer: Medicare Other

## 2022-11-27 NOTE — Telephone Encounter (Signed)
Spoke with Bridgett at [facility].  Prescription will be ready today.  Patient is aware.

## 2022-11-29 ENCOUNTER — Other Ambulatory Visit: Payer: Self-pay | Admitting: Radiology

## 2022-11-30 ENCOUNTER — Ambulatory Visit (HOSPITAL_COMMUNITY)
Admission: RE | Admit: 2022-11-30 | Discharge: 2022-11-30 | Disposition: A | Discharge status: Home / Self Care | Payer: Medicare Other | Source: Ambulatory Visit | Attending: Interventional Radiology | Admitting: Interventional Radiology

## 2022-11-30 ENCOUNTER — Other Ambulatory Visit: Payer: Self-pay | Admitting: Student

## 2022-11-30 NOTE — H&P (Signed)
Chief Complaint: Benign prostatic hypertrophy  Referring Physician(s): Berniece Salines  Supervising Physician: Marliss Coots  Patient Status: [facility] - Out-pt  History of Present Illness: [name] is a 87 y.o. male with history benign prostatic hyperplasia with lower urinary tract symptoms.  He was seen by Dr. Elby Showers on 11/17/22 for evaluation for prostate artery embolization   In January of this year he began to have acute urinary retention.   He attempted self catheterization for a short time but was having problems with urethral bleeding. T  He then had an indwelling Foley catheter placed which is still in place.   He tried tamsulosin which did not improve his symptoms.   Imaging done 03/31/21 showed a 45 g prostate.   Dr. Elby Showers feels he is an excellent candidate for minimally invasive management for BPH given lack of response to medication and continuous anticoagulation requirement.   He is on coumadin for porcine aortic valve replacement and atrial fibrillation and has experience with bridging to Lovenox, however he did NOT this time... He is still on his coumadin.  He is here today for the procedure. He is NPO.   Past Medical History:  Diagnosis Date   Aortic stenosis    Atrial fibrillation (HCC)    Baker cyst    Left knee   DJD (degenerative joint disease)    knees   Dysrhythmia    Eczema    Erectile dysfunction    Gout    History of hiatal hernia    History of radiation therapy    Right lung- 04/21/21-05/03/21 Dr. Antony Blackbird   HTN (hypertension)    MVP (mitral valve prolapse)    Shortness of breath dyspnea    with activity    Past Surgical History:  Procedure Laterality Date    polyps vocal cord  1994   AORTIC VALVE REPLACEMENT  ~2004   BRONCHIAL BIOPSY  03/07/2021   Procedure: BRONCHIAL BIOPSIES;  Surgeon: Leslye Peer, MD;  Location: [facility] ENDOSCOPY;  Service: Pulmonary;;   BRONCHIAL BIOPSY  11/14/2021   Procedure: BRONCHIAL BIOPSIES;   Surgeon: Leslye Peer, MD;  Location: [facility] ENDOSCOPY;  Service: Pulmonary;;   BRONCHIAL BRUSHINGS  03/07/2021   Procedure: BRONCHIAL BRUSHINGS;  Surgeon: Leslye Peer, MD;  Location: [facility] ENDOSCOPY;  Service: Pulmonary;;   BRONCHIAL BRUSHINGS  11/14/2021   Procedure: BRONCHIAL BRUSHINGS;  Surgeon: Leslye Peer, MD;  Location: [facility] ENDOSCOPY;  Service: Pulmonary;;   BRONCHIAL NEEDLE ASPIRATION BIOPSY  03/07/2021   Procedure: BRONCHIAL NEEDLE ASPIRATION BIOPSIES;  Surgeon: Leslye Peer, MD;  Location: [facility] ENDOSCOPY;  Service: Pulmonary;;   BRONCHIAL NEEDLE ASPIRATION BIOPSY  11/14/2021   Procedure: BRONCHIAL NEEDLE ASPIRATION BIOPSIES;  Surgeon: Leslye Peer, MD;  Location: [facility] ENDOSCOPY;  Service: Pulmonary;;   BRONCHIAL WASHINGS  09/21/2020   Procedure: BRONCHIAL WASHINGS;  Surgeon: Martina Sinner, MD;  Location: [facility] ENDOSCOPY;  Service: Pulmonary;;  BAL    BRONCHIAL WASHINGS  03/07/2021   Procedure: BRONCHIAL WASHINGS;  Surgeon: Leslye Peer, MD;  Location: [facility] ENDOSCOPY;  Service: Pulmonary;;   BRONCHIAL WASHINGS  11/14/2021   Procedure: BRONCHIAL WASHINGS;  Surgeon: Leslye Peer, MD;  Location[facility] ENDOSCOPY;  Service: Pulmonary;;   FIDUCIAL MARKER PLACEMENT  03/07/2021   Procedure: FIDUCIAL MARKER PLACEMENT;  Surgeon: Leslye Peer, MD;  Location: [facility] ENDOSCOPY;  Service: Pulmonary;;   HERNIA REPAIR  ~1978   HERNIA REPAIR  ~1983   IR RADIOLOGIST EVAL & MGMT  11/17/2022  LUMBAR LAMINECTOMY/DECOMPRESSION MICRODISCECTOMY N/A 01/15/2015   Procedure: 2 LEVEL DECOMPRESSIVE LUMBAR LAMINECTOMY L3-L4,L4-L5;  Surgeon: Ranee Gosselin, MD;  Location: [facility] ORS;  Service: Orthopedics;  Laterality: N/A;   LUMBAR LAMINECTOMY/DECOMPRESSION MICRODISCECTOMY N/A 05/01/2017   Procedure: Lumbar one-Lumbar two, Lumbar two-Lumbar three, Lumbar three-Lumbar four Laminectomy; Evacuation of hematoma;  Surgeon: Lisbeth Renshaw, MD;  Location: [facility] OR;  Service: Neurosurgery;  Laterality: N/A;   right total hip   ~4 years ago   ROTATOR CUFF REPAIR Left ~2009   TOTAL KNEE ARTHROPLASTY Left 04/14/2015   Procedure: LEFT TOTAL KNEE ARTHROPLASTY;  Surgeon: Ranee Gosselin, MD;  Location: [facility] ORS;  Service: Orthopedics;  Laterality: Left;   VIDEO BRONCHOSCOPY N/A 09/21/2020   Procedure: VIDEO BRONCHOSCOPY WITHOUT FLUORO;  Surgeon: Martina Sinner, MD;  Location: [facility] ENDOSCOPY;  Service: Pulmonary;  Laterality: N/A;   VIDEO BRONCHOSCOPY WITH ENDOBRONCHIAL NAVIGATION Right 03/07/2021   Procedure: VIDEO BRONCHOSCOPY WITH ENDOBRONCHIAL NAVIGATION;  Surgeon: Leslye Peer, MD;  Location: [facility] ENDOSCOPY;  Service: Pulmonary;  Laterality: Right;   VIDEO BRONCHOSCOPY WITH RADIAL ENDOBRONCHIAL ULTRASOUND  11/14/2021   Procedure: VIDEO BRONCHOSCOPY WITH RADIAL ENDOBRONCHIAL ULTRASOUND;  Surgeon: Leslye Peer, MD;  Location: [facility] ENDOSCOPY;  Service: Pulmonary;;    Allergies: Patient has no known allergies.  Medications: Prior to Admission medications   Medication Sig Start Date End Date Taking? Authorizing Provider  allopurinol (ZYLOPRIM) 300 MG tablet Take 1 tablet by mouth once daily 11/13/22   Philip Aspen, Limmie Patricia, MD  amLODipine [facility]) 5 MG tablet Take 1 tablet by mouth once daily 11/13/22   Philip Aspen, Limmie Patricia, MD  furosemide (LASIX) 40 MG tablet Take 1 tablet by mouth once daily 08/14/22   Lewayne Bunting, MD  HYDROcodone-acetaminophen (NORCO) 5-325 MG tablet Take 1 tablet by mouth every 12 (twelve) hours as needed for moderate pain. 10/26/22   Philip Aspen, Limmie Patricia, MD  HYDROcodone-acetaminophen (NORCO) 5-325 MG tablet Take 1 tablet by mouth every 12 (twelve) hours as needed for moderate pain. 10/26/22   Philip Aspen, Limmie Patricia, MD  HYDROcodone-acetaminophen (NORCO) 5-325 MG tablet Take 1 tablet by mouth every 12 (twelve) hours as needed for moderate pain. 10/26/22   Philip Aspen, Limmie Patricia, MD  potassium chloride SA (KLOR-CON M) 20 MEQ tablet Take 1 tablet by mouth once daily 11/14/22    Lewayne Bunting, MD  predniSONE (DELTASONE) 20 MG tablet Take 1 tablet (20 mg total) by mouth daily with breakfast. 10/04/22   Philip Aspen, Limmie Patricia, MD  tamsulosin (FLOMAX) 0.4 MG CAPS capsule TAKE 1 CAPSULE BY MOUTH ONCE DAILY AFTER BREAKFAST 09/25/22   Philip Aspen, Limmie Patricia, MD  traMADol (ULTRAM) 50 MG tablet TAKE 1 TABLET BY MOUTH TWICE DAILY FOR  CHRONIC  PAIN 09/25/22   Philip Aspen, Limmie Patricia, MD  traZODone (DESYREL) 50 MG tablet TAKE 1 TABLET BY MOUTH AT BEDTIME AS NEEDED 09/25/22   Philip Aspen, Limmie Patricia, MD  triamcinolone cream (KENALOG) 0.1 % APPLY  CREAM EXTERNALLY TO AFFECTED AREA TWICE DAILY 05/22/22   Philip Aspen, Limmie Patricia, MD  VITAMIN D PO Take 1 tablet by mouth daily.    [provider]  warfarin (COUMADIN) 5 MG tablet TAKE 1/2 TABLET BY MOUTH  DAILY EXCEPT TAKE 1 TABLET ON MONDAYS OR AS  DIRECTED  BY  ANTICOAGULATION  CLINIC 03/28/22   Philip Aspen, Limmie Patricia, MD     Family History  Problem Relation Age of Onset   Diabetes Other    Stroke Other  Social History   Socioeconomic History   Marital status: Divorced    Spouse name: Not on file   Number of children: Not on file   Years of education: Not on file   Highest education level: Not on file  Occupational History   Not on file  Tobacco Use   Smoking status: Former    Years: 30    Types: Cigarettes    Quit date: 08/28/2002    Years since quitting: 20.2   Smokeless tobacco: Never  Vaping Use   Vaping Use: Never used  Substance and Sexual Activity   Alcohol use: Yes    Alcohol/week: 3.0 standard drinks of alcohol    Types: 3 Shots of liquor per week    Comment: social   Drug use: No   Sexual activity: Not on file  Other Topics Concern   Not on file  Social History Narrative   Not on file   Social Determinants of Health   Financial Resource Strain: Low Risk  (10/26/2022)   Overall Financial Resource Strain (CARDIA)    Difficulty of Paying Living Expenses: Not hard at all   Food Insecurity: No Food Insecurity (10/26/2022)   Hunger Vital Sign    Worried About Running Out of Food in the Last Year: Never true    Ran Out of Food in the Last Year: Never true  Transportation Needs: No Transportation Needs (10/26/2022)   PRAPARE - Administrator, Civil Service (Medical): No    Lack of Transportation (Non-Medical): No  Physical Activity: Insufficiently Active (10/26/2022)   Exercise Vital Sign    Days of Exercise per Week: 3 days    Minutes of Exercise per Session: 30 min  Stress: No Stress Concern Present (10/26/2022)   [employer] of Occupational Health - Occupational Stress Questionnaire    Feeling of Stress : Not at all  Social Connections: Moderately Isolated (10/26/2022)   Social Connection and Isolation Panel [NHANES]    Frequency of Communication with Friends and Family: More than three times a week    Frequency of Social Gatherings with Friends and Family: Once a week    Attends Religious Services: More than 4 times per year    Active Member of [employer] or Organizations: No    Attends Banker Meetings: Never    Marital Status: Divorced     Review of Systems: A 12 point ROS discussed and pertinent positives are indicated in the HPI above.  All other systems are negative.  Review of Systems  Vital Signs: BP (!) 165/74   Pulse 85   Temp (!) 96.9 F (36.1 C) (Temporal)   Resp 16   Ht 5\' 6"  (1.676 m)   Wt 162 lb (73.5 kg)   SpO2 98%   BMI 26.15 kg/m   Physical Exam Vitals reviewed.  Constitutional:      Appearance: Normal appearance.  HENT:     Head: Normocephalic and atraumatic.  Eyes:     Extraocular Movements: Extraocular movements intact.  Cardiovascular:     Rate and Rhythm: Normal rate and regular rhythm.  Pulmonary:     Effort: Pulmonary effort is normal. No respiratory distress.     Breath sounds: Normal breath sounds.  Abdominal:     General: There is no distension.     Palpations: Abdomen is soft.      Tenderness: There is no abdominal tenderness.  Musculoskeletal:        General: Normal range of motion.  Cervical back: Normal range of motion.  Skin:    General: Skin is warm and dry.  Neurological:     General: No focal deficit present.     Mental Status: He is alert and oriented to person, place, and time.  Psychiatric:        Mood and Affect: Mood normal.        Behavior: Behavior normal.        Thought Content: Thought content normal.        Judgment: Judgment normal.     Imaging: CT ANGIO PELVIS W OR WO CONTRAST  Result Date: 11/23/2022 CLINICAL DATA:  Urinary retention, prostatic hypertrophy, preop planning EXAM: CT ANGIOGRAPHY  PELVIS TECHNIQUE: Multidetector CT imaging through the pelvis was performed using the standard protocol during bolus administration of intravenous contrast. Multiplanar reconstructed images and MIPs were obtained and reviewed to evaluate the vascular anatomy. RADIATION DOSE REDUCTION: This exam was performed according to the departmental dose-optimization program which includes automated exposure control, adjustment of the mA and/or kV according to patient size and/or use of iterative reconstruction technique. CONTRAST:  OMNIPAQUE IOHEXOL 350 MG/ML SOLN COMPARISON:  11/30/2014 and previous FINDINGS: VASCULAR Aorta: Extensive calcified atheromatous plaque without aneurysm, dissection, or stenosis in the visualized infrarenal segment. Inflow: Mild tortuosity. Moderate calcified atheromatous plaque throughout without aneurysm or high-grade stenosis. Veins: No obvious venous abnormality within the limitations of this arterial phase study. Review of the MIP images confirms the above findings. NON-VASCULAR Hepatobiliary: Visualized inferior right hepatic lobe unremarkable. Hyperdense material in the dependent aspect of the nondilated gallbladder suggesting cholelithiasis. Urinary Tract: Visualized portions of kidneys unremarkable. Foley catheter decompresses  the urinary bladder. Bowel: Visualized portions of small bowel and colon are nondilated. Innumerable sigmoid diverticula without regional inflammatory change. Lymphatic: No abdominal or pelvic adenopathy. Reproductive: Prostate enlargement. Other: No pelvic ascites.  No evident free air. Musculoskeletal: Small paraumbilical hernia containing only mesenteric fat. Right hip arthroplasty components project in expected location. Spondylitic changes in the visualized lower lumbar spine. Advanced left hip DJD with near bone on bone apposition superiorly. No fracture or worrisome bone lesion. Review of the MIP images confirms the above findings. IMPRESSION: 1. No acute findings. 2. Aortoiliac atherosclerosis (ICD10-I70.0) without aneurysm or stenosis. 3. Cholelithiasis. 4. Sigmoid diverticulosis. 5. Prostate enlargement. 6. Advanced left hip DJD. Electronically Signed   By: Corlis Leak M.D.   On: 11/23/2022 16:18   IR Radiologist Eval & Mgmt  Result Date: 11/17/2022 EXAM: NEW PATIENT OFFICE VISIT CHIEF COMPLAINT: See Epic note. HISTORY OF PRESENT ILLNESS: See Epic note. REVIEW OF SYSTEMS: See Epic note. PHYSICAL EXAMINATION: See Epic note. ASSESSMENT AND PLAN: See Epic note. Marliss Coots, MD [facility] Electronically Signed   By: Marliss Coots M.D.   On: 11/17/2022 12:06    Labs:  CBC: Recent Labs    06/07/22 0939 06/23/22 1149 10/26/22 1051 12/01/22 1128  WBC 6.7 8.6 7.1 7.4  HGB 12.8* 13.0 12.5* 12.2*  HCT 37.2* 39.5 37.2* 36.3*  PLT 204 187.0 257.0 203    COAGS: Recent Labs    10/17/22 0000 11/07/22 0000 11/21/22 0000 12/01/22 1128  INR 1.8* 4.0* 2.0 2.1*    BMP: Recent Labs    06/07/22 0939 06/16/22 1126 10/26/22 1051  NA 141 142 142  142  K 3.7 3.9 3.9  3.9  CL 103 102 101  101  CO2 27 26 32  32  GLUCOSE 90 85 80  80  BUN 18 20  17  17  CALCIUM 8.8 9.3 9.2  9.2  CREATININE 1.12 1.06 1.21  1.21    LIVER  FUNCTION TESTS: Recent Labs    10/26/22 1051  BILITOT 0.5  AST 19  ALT 10  ALKPHOS 51  PROT 5.9*  ALBUMIN 3.5    TUMOR MARKERS: No results for input(s): "AFPTM", "CEA", "CA199", "CHROMGRNA" in the last 8760 hours.  Assessment and Plan:  Benign prostatic hypertrophy.  Will proceed with pelvic angiography and prostate artery embolization today by Dr. Elby ShowersSuttle.  The Risks and benefits of embolization were discussed with the patient including, but not limited to bleeding, infection, vascular injury, post operative pain, or contrast induced renal failure.  This procedure involves the use of X-rays and because of the nature of the planned procedure, it is possible that we will have prolonged use of X-ray fluoroscopy.  Potential radiation risks to you include (but are not limited to) the following: - A slightly elevated risk for cancer several years later in life. This risk is typically less than 0.5% percent. This risk is low in comparison to the normal incidence of human cancer, which is 33% for women and 50% for men according to the American Cancer Society. - Radiation induced injury can include skin redness, resembling a rash, tissue breakdown / ulcers and hair loss (which can be temporary or permanent).   The likelihood of either of these occurring depends on the difficulty of the procedure and whether you are sensitive to radiation due to previous procedures, disease, or genetic conditions.   IF your procedure requires a prolonged use of radiation, you will be notified and given written instructions for further action.  It is your responsibility to monitor the irradiated area for the 2 weeks following the procedure and to notify your physician if you are concerned that you have suffered a radiation induced injury.    All of the patient's questions were answered, patient is agreeable to proceed. Consent signed and in chart.  Thank you for allowing our service to participate in  [name] 's care.  Electronically Signed: Gwynneth MacleodWENDY S Jalaina Salyers, PA-C   12/01/2022, 12:23 PM      I spent a total of    25 Minutes in face to face in clinical consultation, greater than 50% of which was counseling/coordinating care for prostate artery embolization.

## 2022-12-01 ENCOUNTER — Other Ambulatory Visit (HOSPITAL_COMMUNITY): Payer: Self-pay | Admitting: Interventional Radiology

## 2022-12-01 ENCOUNTER — Ambulatory Visit (HOSPITAL_COMMUNITY)
Admission: RE | Admit: 2022-12-01 | Discharge: 2022-12-01 | Disposition: A | Discharge status: Home / Self Care | Payer: Medicare Other | Source: Ambulatory Visit | Attending: Interventional Radiology | Admitting: Interventional Radiology

## 2022-12-01 ENCOUNTER — Other Ambulatory Visit: Payer: Self-pay | Admitting: Student

## 2022-12-01 ENCOUNTER — Other Ambulatory Visit: Payer: Self-pay

## 2022-12-01 DIAGNOSIS — N401 Enlarged prostate with lower urinary tract symptoms: Secondary | ICD-10-CM | POA: Diagnosis present

## 2022-12-01 DIAGNOSIS — I748 Embolism and thrombosis of other arteries: Secondary | ICD-10-CM | POA: Diagnosis not present

## 2022-12-01 DIAGNOSIS — Z7901 Long term (current) use of anticoagulants: Secondary | ICD-10-CM | POA: Insufficient documentation

## 2022-12-01 DIAGNOSIS — R339 Retention of urine, unspecified: Secondary | ICD-10-CM | POA: Diagnosis not present

## 2022-12-01 DIAGNOSIS — N400 Enlarged prostate without lower urinary tract symptoms: Secondary | ICD-10-CM

## 2022-12-01 DIAGNOSIS — Z87891 Personal history of nicotine dependence: Secondary | ICD-10-CM | POA: Diagnosis not present

## 2022-12-01 DIAGNOSIS — Z953 Presence of xenogenic heart valve: Secondary | ICD-10-CM | POA: Diagnosis not present

## 2022-12-01 DIAGNOSIS — I4891 Unspecified atrial fibrillation: Secondary | ICD-10-CM | POA: Insufficient documentation

## 2022-12-01 HISTORY — PX: IR US GUIDE VASC ACCESS RIGHT: IMG2390

## 2022-12-01 HISTORY — PX: IR ANGIOGRAM SELECTIVE EACH ADDITIONAL VESSEL: IMG667

## 2022-12-01 HISTORY — PX: IR EMBO ARTERIAL NOT HEMORR HEMANG INC GUIDE ROADMAPPING: IMG5448

## 2022-12-01 HISTORY — PX: IR ANGIOGRAM PELVIS SELECTIVE OR SUPRASELECTIVE: IMG661

## 2022-12-01 HISTORY — PX: IR US GUIDE VASC ACCESS LEFT: IMG2389

## 2022-12-01 HISTORY — PX: IR CT SPINE LTD: IMG2387

## 2022-12-01 HISTORY — PX: IR EMBO TUMOR ORGAN ISCHEMIA INFARCT INC GUIDE ROADMAPPING: IMG5449

## 2022-12-01 LAB — COMPREHENSIVE METABOLIC PANEL
ALT: 14 U/L (ref 0–44)
AST: 24 U/L (ref 15–41)
Albumin: 3.6 g/dL (ref 3.5–5.0)
Alkaline Phosphatase: 57 U/L (ref 38–126)
Anion gap: 13 (ref 5–15)
BUN: 13 mg/dL (ref 8–23)
CO2: 28 mmol/L (ref 22–32)
Calcium: 9 mg/dL (ref 8.9–10.3)
Chloride: 98 mmol/L (ref 98–111)
Creatinine, Ser: 1.13 mg/dL (ref 0.61–1.24)
GFR, Estimated: >60 mL/min (ref >60)
Glucose, Bld: 99 mg/dL (ref 70–99)
Potassium: 3.8 mmol/L (ref 3.5–5.1)
Sodium: 139 mmol/L (ref 135–145)
Total Bilirubin: 1.2 mg/dL (ref 0.3–1.2)
Total Protein: 6.4 g/dL — ABNORMAL LOW (ref 6.5–8.1)

## 2022-12-01 LAB — CBC
HCT: 36.3 % — ABNORMAL LOW (ref 39.0–52.0)
Hemoglobin: 12.2 g/dL — ABNORMAL LOW (ref 13.0–17.0)
MCH: 33.2 pg (ref 26.0–34.0)
MCHC: 33.6 g/dL (ref 30.0–36.0)
MCV: 98.9 fL (ref 80.0–100.0)
Platelets: 203 10*3/uL (ref 150–400)
RBC: 3.67 MIL/uL — ABNORMAL LOW (ref 4.22–5.81)
RDW: 13.5 % (ref 11.5–15.5)
WBC: 7.4 10*3/uL (ref 4.0–10.5)
nRBC: 0 % (ref 0.0–0.2)

## 2022-12-01 LAB — PROTIME-INR
INR: 2.1 — ABNORMAL HIGH (ref 0.8–1.2)
Prothrombin Time: 23.6 seconds — ABNORMAL HIGH (ref 11.4–15.2)

## 2022-12-01 MED ORDER — NITROGLYCERIN 1 MG/10 ML FOR IR/CATH LAB
INTRA_ARTERIAL | Status: AC | PRN
  Administered 2022-12-01 (×2): 200 ug via INTRA_ARTERIAL

## 2022-12-01 MED ORDER — IBUPROFEN 200 MG PO TABS: 600.0000 mg | ORAL_TABLET | Freq: Four times a day (QID) | ORAL | 0 refills | Status: AC | PRN

## 2022-12-01 MED ORDER — CIPROFLOXACIN IN D5W 400 MG/200ML IV SOLN
400.0000 mg | Freq: Once | INTRAVENOUS | Status: AC
  Administered 2022-12-01: 400 mg via INTRAVENOUS
  Filled 2022-12-01: qty 200

## 2022-12-01 MED ORDER — NITROGLYCERIN 1 MG/10 ML FOR IR/CATH LAB
INTRA_ARTERIAL | Status: AC
  Filled 2022-12-01: qty 10

## 2022-12-01 MED ORDER — MIDAZOLAM HCL 2 MG/2ML IJ SOLN
INTRAMUSCULAR | Status: AC
  Filled 2022-12-01: qty 2

## 2022-12-01 MED ORDER — IODIXANOL 320 MG/ML IV SOLN
100.0000 mL | Freq: Once | INTRAVENOUS | Status: AC | PRN
  Administered 2022-12-01: 30 mL via INTRAVENOUS

## 2022-12-01 MED ORDER — IODIXANOL 320 MG/ML IV SOLN
100.0000 mL | Freq: Once | INTRAVENOUS | Status: AC | PRN
  Administered 2022-12-01: 100 mL via INTRAVENOUS

## 2022-12-01 MED ORDER — FENTANYL CITRATE (PF) 100 MCG/2ML IJ SOLN
INTRAMUSCULAR | Status: AC
  Filled 2022-12-01: qty 2

## 2022-12-01 MED ORDER — PREDNISONE 20 MG PO TABS
20.0000 mg | ORAL_TABLET | Freq: Once | ORAL | Status: AC
  Administered 2022-12-01: 20 mg via ORAL
  Filled 2022-12-01: qty 1

## 2022-12-01 MED ORDER — LIDOCAINE-PRILOCAINE 2.5-2.5 % EX CREA
TOPICAL_CREAM | Freq: Once | CUTANEOUS | Status: AC
  Filled 2022-12-01: qty 5

## 2022-12-01 MED ORDER — PHENAZOPYRIDINE HCL 95 MG PO TABS: 95.0000 mg | ORAL_TABLET | Freq: Three times a day (TID) | ORAL | 0 refills | Status: AC

## 2022-12-01 MED ORDER — CIPROFLOXACIN HCL 500 MG PO TABS: 500.0000 mg | ORAL_TABLET | Freq: Two times a day (BID) | ORAL | 0 refills | Status: AC

## 2022-12-01 MED ORDER — VERAPAMIL HCL 2.5 MG/ML IV SOLN
INTRAVENOUS | Status: AC
  Filled 2022-12-01: qty 2

## 2022-12-01 MED ORDER — FENTANYL CITRATE (PF) 100 MCG/2ML IJ SOLN
INTRAMUSCULAR | Status: AC | PRN
  Administered 2022-12-01: 50 ug via INTRAVENOUS
  Administered 2022-12-01: 25 ug via INTRAVENOUS
  Administered 2022-12-01 (×3): 50 ug via INTRAVENOUS
  Administered 2022-12-01: 25 ug via INTRAVENOUS
  Administered 2022-12-01: 50 ug via INTRAVENOUS

## 2022-12-01 MED ORDER — NITROGLYCERIN 2 % TD OINT
1.0000 [in_us] | TOPICAL_OINTMENT | Freq: Once | TRANSDERMAL | Status: AC
  Administered 2022-12-01: 1 [in_us] via TOPICAL
  Filled 2022-12-01 (×2): qty 30

## 2022-12-01 MED ORDER — LIDOCAINE-EPINEPHRINE 1 %-1:100000 IJ SOLN
INTRAMUSCULAR | Status: AC
  Filled 2022-12-01: qty 1

## 2022-12-01 MED ORDER — SOLIFENACIN SUCCINATE 5 MG PO TABS: 5.0000 mg | ORAL_TABLET | Freq: Every day | ORAL | 0 refills | Status: AC

## 2022-12-01 MED ORDER — HEPARIN SODIUM (PORCINE) 1000 UNIT/ML IJ SOLN
INTRAMUSCULAR | Status: AC
  Filled 2022-12-01: qty 10

## 2022-12-01 MED ORDER — MIDAZOLAM HCL 2 MG/2ML IJ SOLN
INTRAMUSCULAR | Status: AC | PRN
  Administered 2022-12-01: 1 mg via INTRAVENOUS
  Administered 2022-12-01: .5 mg via INTRAVENOUS
  Administered 2022-12-01 (×2): 1 mg via INTRAVENOUS
  Administered 2022-12-01: .5 mg via INTRAVENOUS
  Administered 2022-12-01 (×2): 1 mg via INTRAVENOUS

## 2022-12-01 MED ORDER — IODIXANOL 320 MG/ML IV SOLN
100.0000 mL | Freq: Once | INTRAVENOUS | Status: AC | PRN
  Administered 2022-12-01: 65 mL via INTRAVENOUS

## 2022-12-01 NOTE — Sedation Documentation (Signed)
 fentanyl and 1mg  versed handed off to Sheela Stack

## 2022-12-01 NOTE — Procedures (Signed)
Interventional Radiology Procedure Note  Procedure:  1) Pelvic angiogram 2) Selective catheterization and angiography of bilateral internal iliac arteries 3) Left prostatic artery embolization  Findings: Please refer to procedural dictation for full description.  Left prostate artery embolization with coil embolization of rectal and penile collateral branches for embolic protection.  400 micron hydropearls followed by n-bca.   Extensive spasm of the probable right prostatic arteries, unable to treat right side at this time.  Initial left radial access aborted due to palor and loss of capillary refill in left hand after sheath placement despite Barbeau B.  Converted to right femoral approach, 5 Fr sheath, with 6 Fr Angioseal closure.  Complications: None immediate  Estimated Blood Loss: < 5 mL  Recommendations: 2 hours strict flat bedrest. Discharge home today. IR will follow in clinic and arrange for voiding trial with Dr. Marlou Porch.   Marliss Coots, MD Pager: [phone]

## 2022-12-01 NOTE — Progress Notes (Signed)
Interventional Radiology Brief Note:  Post-procedure prescriptions called into patient's preferred pharmacy [facility], [place])  -phenazopyridine 100 mg TID x 7 days  -solifenacin 5 mg QD x 7 days  -ciprofloxacin 500 mg BID x 7 days  -ibuprofen x 7 days   Loyce Dys, MS RD PA-C 3:40 PM

## 2022-12-04 ENCOUNTER — Telehealth: Payer: Self-pay

## 2022-12-04 NOTE — Telephone Encounter (Signed)
Pt reports he had a pelvic angiography and prostate artery embolization. He reports he did not have to stop warfarin. This procedure is to shrink the prostate and hopefully be able to remove foley cath. They report this can take up to 4 weeks to shrink the prostate. INR was checked before procedure and it was 2.1 Pt reports he has an apt with urology on 4/15, in one week, to for a cath removal trial.  Pt called to inform coumadin clinic of procedure and if coumadin clinic apt on 4/23 is necessary or should it be pushed back.  Advised he should keep coumadin clinic apt due to erratic INRs in the past. Pt agreed and will keep apt on 4/23. Pt denies any problems with the procedure and denies any bleeding. Advised if any changes to contact the coumadin clinic. Pt verbalized understanding.

## 2022-12-11 DIAGNOSIS — R338 Other retention of urine: Secondary | ICD-10-CM | POA: Diagnosis not present

## 2022-12-12 DIAGNOSIS — R338 Other retention of urine: Secondary | ICD-10-CM | POA: Diagnosis not present

## 2022-12-19 ENCOUNTER — Ambulatory Visit (INDEPENDENT_AMBULATORY_CARE_PROVIDER_SITE_OTHER): Payer: Medicare Other

## 2022-12-19 DIAGNOSIS — Z7901 Long term (current) use of anticoagulants: Secondary | ICD-10-CM | POA: Diagnosis not present

## 2022-12-19 LAB — POCT INR: INR: 2.7 (ref 2.0–3.0)

## 2022-12-19 NOTE — Patient Instructions (Addendum)
Pre visit review using our clinic review tool, if applicable. No additional management support is needed unless otherwise documented below in the visit note.  Hold dose tomorrow and the continue 1/2 tablet daily. Recheck in 3 weeks.

## 2022-12-19 NOTE — Progress Notes (Signed)
Pt also taking prednisone 10 mg QD. Pt denies any other changes. Pt already took warfarin dose today. Hold dose tomorrow and the continue 1/2 tablet daily. Recheck in 3 weeks.

## 2022-12-20 DIAGNOSIS — R338 Other retention of urine: Secondary | ICD-10-CM | POA: Diagnosis not present

## 2022-12-21 DIAGNOSIS — R338 Other retention of urine: Secondary | ICD-10-CM | POA: Diagnosis not present

## 2022-12-25 ENCOUNTER — Other Ambulatory Visit: Payer: Self-pay | Admitting: Internal Medicine

## 2022-12-25 DIAGNOSIS — M159 Polyosteoarthritis, unspecified: Secondary | ICD-10-CM

## 2022-12-25 DIAGNOSIS — G4700 Insomnia, unspecified: Secondary | ICD-10-CM

## 2022-12-25 MED ORDER — HYDROCODONE-ACETAMINOPHEN 5-325 MG PO TABS
1.0000 | ORAL_TABLET | Freq: Two times a day (BID) | ORAL | 0 refills | Status: DC | PRN
Start: 2022-12-25 — End: 2023-01-16

## 2022-12-25 NOTE — Telephone Encounter (Signed)
Prescription Request  12/25/2022  LOV: 10/26/2022  What is the name of the medication or equipment? HYDROcodone-acetaminophen (NORCO) 5-325 MG tablet   Have you contacted your pharmacy to request a refill? Yes   Which pharmacy would you like this sent to?  [facility] [place], [place] - [address] [place] [address] Phone: [phone] Fax: [phone]    Patient notified that their request is being sent to the clinical staff for review and that they should receive a response within 2 business days.   Please advise at Mobile [phone] (mobile)

## 2023-01-04 DIAGNOSIS — R338 Other retention of urine: Secondary | ICD-10-CM | POA: Diagnosis not present

## 2023-01-05 DIAGNOSIS — R338 Other retention of urine: Secondary | ICD-10-CM | POA: Diagnosis not present

## 2023-01-09 ENCOUNTER — Telehealth: Payer: Self-pay

## 2023-01-09 ENCOUNTER — Ambulatory Visit: Payer: Medicare Other

## 2023-01-09 NOTE — Telephone Encounter (Signed)
Pt LVM he is not feeling well today and would like to RS his coumadin clinic apt.   Contacted pt who reports he has been very tired and not sleeping well. Requested to RS apt for next week. RS coumadin clinic apt for 5/21. Advised if any changes to contact office. Pt verbalized understanding.

## 2023-01-10 ENCOUNTER — Telehealth: Payer: Self-pay | Admitting: Pulmonary Disease

## 2023-01-10 NOTE — Telephone Encounter (Signed)
Pt calling in about him thinking he may have pneumonia and wants a antibiotic sent in

## 2023-01-11 NOTE — Telephone Encounter (Signed)
Called the pt and there was no answer- LMTCB   Routing back to triage as urgent

## 2023-01-14 ENCOUNTER — Telehealth: Payer: Self-pay | Admitting: Emergency Medicine

## 2023-01-14 NOTE — Telephone Encounter (Signed)
Has evolved exertional SOB for about 2 days. No cough or sputum. No new meds. No fever. No pain of any kind.  He is concerned that he may be developing pneumonia or recurrence of his HSP.  Explained to him that we need to evaluate him in person, obtain a chest x-ray etc. to troubleshoot.  Will work on making him an office visit ASAP to start the evaluation.  Explained to him that if he worsens in any way that he needs to go be seen in urgent care start the evaluation immediately.  He voiced understanding.  Please set the patient up with an acute visit, any provider, ASAP.  Needs a chest x-ray at that visit.

## 2023-01-15 ENCOUNTER — Ambulatory Visit: Payer: Medicare Other | Admitting: Student

## 2023-01-15 ENCOUNTER — Ambulatory Visit (INDEPENDENT_AMBULATORY_CARE_PROVIDER_SITE_OTHER): Payer: Medicare Other

## 2023-01-15 ENCOUNTER — Encounter: Payer: Self-pay | Admitting: Student

## 2023-01-15 VITALS — BP 132/70 | HR 88 | Temp 97.8°F | Ht 66.0 in | Wt 167.0 lb

## 2023-01-15 DIAGNOSIS — J90 Pleural effusion, not elsewhere classified: Secondary | ICD-10-CM | POA: Diagnosis not present

## 2023-01-15 DIAGNOSIS — R0609 Other forms of dyspnea: Secondary | ICD-10-CM

## 2023-01-15 DIAGNOSIS — R0600 Dyspnea, unspecified: Secondary | ICD-10-CM | POA: Diagnosis not present

## 2023-01-15 LAB — BASIC METABOLIC PANEL
BUN: 14 mg/dL (ref 6–23)
CO2: 28 mEq/L (ref 19–32)
Calcium: 9.1 mg/dL (ref 8.4–10.5)
Chloride: 103 mEq/L (ref 96–112)
Creatinine, Ser: 0.98 mg/dL (ref 0.40–1.50)
GFR: 70.51 mL/min (ref >60.00)
Glucose, Bld: 92 mg/dL (ref 70–99)
Potassium: 3.7 mEq/L (ref 3.5–5.1)
Sodium: 141 mEq/L (ref 135–145)

## 2023-01-15 MED ORDER — FUROSEMIDE 40 MG PO TABS: 40.0000 mg | ORAL_TABLET | Freq: Two times a day (BID) | ORAL | 0 refills | Status: DC

## 2023-01-15 MED ORDER — POTASSIUM CHLORIDE CRYS ER 20 MEQ PO TBCR: 20.0000 meq | EXTENDED_RELEASE_TABLET | Freq: Two times a day (BID) | ORAL | 0 refills | Status: DC

## 2023-01-15 NOTE — Progress Notes (Signed)
Synopsis: Referred for dyspnea on exertion by Philip Aspen, [place]*  Subjective:   PATIENT ID: [name] GENDER: male DOB: 05-Jan-1936, MRN: [id number]  Chief Complaint  Patient presents with   Acute Visit    Pt states he has been having problems with SOB which first began about 3 days ago. States he is SOB with exertion. Denies any complaints of cough, wheezing, or fever.    [name] is an 87 year old male, former smoker with aortic valve replacement and atrial fibrillation on coumadin, RA chronically on 10 mg prednisone daily, HP (raised chickens, HP panel with A pullulans Abs (though these assd with humidifier/AC) improved with low dose prednisone taper), RUL NSCLC sp SBRT 2022  He says that he 'just cannot breathe' - has been going on since Friday night/Saturday morning. He has no cough, fever. No hemoptysis and he is still on warfarin. No CP.   He is on prednisone 10 mg daily. He is on lasix 40 mg daily, not changed recently. His weight is stable relative to last year.   He had bronchoscopy 09/21/20 with PJP DFA negative. Cell count/diff from BAL 91% lymphs.  Otherwise pertinent review of systems is negative.  Past Medical History:  Diagnosis Date   Aortic stenosis    Atrial fibrillation (HCC)    Baker cyst    Left knee   DJD (degenerative joint disease)    knees   Dysrhythmia    Eczema    Erectile dysfunction    Gout    History of hiatal hernia    History of radiation therapy    Right lung- 04/21/21-05/03/21 Dr. Antony Blackbird   HTN (hypertension)    MVP (mitral valve prolapse)    Shortness of breath dyspnea    with activity     Family History  Problem Relation Age of Onset   Diabetes Other    Stroke Other      Past Surgical History:  Procedure Laterality Date    polyps vocal cord  1994   AORTIC VALVE REPLACEMENT  ~2004   BRONCHIAL BIOPSY  03/07/2021   Procedure: BRONCHIAL BIOPSIES;  Surgeon: Leslye Peer, MD;  Location: [facility] ENDOSCOPY;  Service:  Pulmonary;;   BRONCHIAL BIOPSY  11/14/2021   Procedure: BRONCHIAL BIOPSIES;  Surgeon: Leslye Peer, MD;  Location: [facility] ENDOSCOPY;  Service: Pulmonary;;   BRONCHIAL BRUSHINGS  03/07/2021   Procedure: BRONCHIAL BRUSHINGS;  Surgeon: Leslye Peer, MD;  Location: [facility] ENDOSCOPY;  Service: Pulmonary;;   BRONCHIAL BRUSHINGS  11/14/2021   Procedure: BRONCHIAL BRUSHINGS;  Surgeon: Leslye Peer, MD;  Location: [facility] ENDOSCOPY;  Service: Pulmonary;;   BRONCHIAL NEEDLE ASPIRATION BIOPSY  03/07/2021   Procedure: BRONCHIAL NEEDLE ASPIRATION BIOPSIES;  Surgeon: Leslye Peer, MD;  Location: [facility] ENDOSCOPY;  Service: Pulmonary;;   BRONCHIAL NEEDLE ASPIRATION BIOPSY  11/14/2021   Procedure: BRONCHIAL NEEDLE ASPIRATION BIOPSIES;  Surgeon: Leslye Peer, MD;  Location: [facility] ENDOSCOPY;  Service: Pulmonary;;   BRONCHIAL WASHINGS  09/21/2020   Procedure: BRONCHIAL WASHINGS;  Surgeon: Martina Sinner, MD;  Location: [facility] ENDOSCOPY;  Service: Pulmonary;;  BAL    BRONCHIAL WASHINGS  03/07/2021   Procedure: BRONCHIAL WASHINGS;  Surgeon: Leslye Peer, MD;  Location: [facility] ENDOSCOPY;  Service: Pulmonary;;   BRONCHIAL WASHINGS  11/14/2021   Procedure: BRONCHIAL WASHINGS;  Surgeon: Leslye Peer, MD;  Location: [facility] ENDOSCOPY;  Service: Pulmonary;;   FIDUCIAL MARKER PLACEMENT  03/07/2021   Procedure: FIDUCIAL MARKER PLACEMENT;  Surgeon: Leslye Peer,  MD;  Location: [facility] ENDOSCOPY;  Service: Pulmonary;;   HERNIA REPAIR  ~1978   HERNIA REPAIR  ~1983   IR ANGIOGRAM PELVIS SELECTIVE OR SUPRASELECTIVE  12/01/2022   IR ANGIOGRAM PELVIS SELECTIVE OR SUPRASELECTIVE  12/01/2022   IR ANGIOGRAM SELECTIVE EACH ADDITIONAL VESSEL  12/01/2022   IR ANGIOGRAM SELECTIVE EACH ADDITIONAL VESSEL  12/01/2022   IR CT SPINE LTD  12/01/2022   IR CT SPINE LTD  12/01/2022   IR CT SPINE LTD  12/01/2022   IR EMBO ARTERIAL NOT HEMORR HEMANG INC GUIDE ROADMAPPING  12/01/2022   IR EMBO TUMOR ORGAN ISCHEMIA INFARCT INC GUIDE ROADMAPPING  12/01/2022   IR RADIOLOGIST  EVAL & MGMT  11/17/2022   IR US GUIDE VASC ACCESS LEFT  12/01/2022   IR US GUIDE VASC ACCESS RIGHT  12/01/2022   LUMBAR LAMINECTOMY/DECOMPRESSION MICRODISCECTOMY N/A 01/15/2015   Procedure: 2 LEVEL DECOMPRESSIVE LUMBAR LAMINECTOMY L3-L4,L4-L5;  Surgeon: Ranee Gosselin, MD;  Location: [facility] ORS;  Service: Orthopedics;  Laterality: N/A;   LUMBAR LAMINECTOMY/DECOMPRESSION MICRODISCECTOMY N/A 05/01/2017   Procedure: Lumbar one-Lumbar two, Lumbar two-Lumbar three, Lumbar three-Lumbar four Laminectomy; Evacuation of hematoma;  Surgeon: Lisbeth Renshaw, MD;  Location: [facility] OR;  Service: Neurosurgery;  Laterality: N/A;   right total hip  ~4 years ago   ROTATOR CUFF REPAIR Left ~2009   TOTAL KNEE ARTHROPLASTY Left 04/14/2015   Procedure: LEFT TOTAL KNEE ARTHROPLASTY;  Surgeon: Ranee Gosselin, MD;  Location: [facility] ORS;  Service: Orthopedics;  Laterality: Left;   VIDEO BRONCHOSCOPY N/A 09/21/2020   Procedure: VIDEO BRONCHOSCOPY WITHOUT FLUORO;  Surgeon: Martina Sinner, MD;  Location: [facility] ENDOSCOPY;  Service: Pulmonary;  Laterality: N/A;   VIDEO BRONCHOSCOPY WITH ENDOBRONCHIAL NAVIGATION Right 03/07/2021   Procedure: VIDEO BRONCHOSCOPY WITH ENDOBRONCHIAL NAVIGATION;  Surgeon: Leslye Peer, MD;  Location: [facility] ENDOSCOPY;  Service: Pulmonary;  Laterality: Right;   VIDEO BRONCHOSCOPY WITH RADIAL ENDOBRONCHIAL ULTRASOUND  11/14/2021   Procedure: VIDEO BRONCHOSCOPY WITH RADIAL ENDOBRONCHIAL ULTRASOUND;  Surgeon: Leslye Peer, MD;  Location: [facility] ENDOSCOPY;  Service: Pulmonary;;    Social History   Socioeconomic History   Marital status: Divorced    Spouse name: Not on file   Number of children: Not on file   Years of education: Not on file   Highest education level: Not on file  Occupational History   Not on file  Tobacco Use   Smoking status: Former    Years: 30    Types: Cigarettes    Quit date: 08/28/2002    Years since quitting: 20.3   Smokeless tobacco: Never  Vaping Use   Vaping Use: Never used   Substance and Sexual Activity   Alcohol use: Yes    Alcohol/week: 3.0 standard drinks of alcohol    Types: 3 Shots of liquor per week    Comment: social   Drug use: No   Sexual activity: Not on file  Other Topics Concern   Not on file  Social History Narrative   Not on file   Social Determinants of Health   Financial Resource Strain: Low Risk  (10/26/2022)   Overall Financial Resource Strain (CARDIA)    Difficulty of Paying Living Expenses: Not hard at all  Food Insecurity: No Food Insecurity (10/26/2022)   Hunger Vital Sign    Worried About Running Out of Food in the Last Year: Never true    Ran Out of Food in the Last Year: Never true  Transportation Needs: No Transportation Needs (10/26/2022)   PRAPARE - Transportation  Lack of Transportation (Medical): No    Lack of Transportation (Non-Medical): No  Physical Activity: Insufficiently Active (10/26/2022)   Exercise Vital Sign    Days of Exercise per Week: 3 days    Minutes of Exercise per Session: 30 min  Stress: No Stress Concern Present (10/26/2022)   [employer] of Occupational Health - Occupational Stress Questionnaire    Feeling of Stress : Not at all  Social Connections: Moderately Isolated (10/26/2022)   Social Connection and Isolation Panel [NHANES]    Frequency of Communication with Friends and Family: More than three times a week    Frequency of Social Gatherings with Friends and Family: Once a week    Attends Religious Services: More than 4 times per year    Active Member of [employer] or Organizations: No    Attends Banker Meetings: Never    Marital Status: Divorced  Catering manager Violence: Not At Risk (10/26/2022)   Humiliation, Afraid, Rape, and Kick questionnaire    Fear of Current or Ex-Partner: No    Emotionally Abused: No    Physically Abused: No    Sexually Abused: No     No Known Allergies   Outpatient Medications Prior to Visit  Medication Sig Dispense Refill   allopurinol  (ZYLOPRIM) 300 MG tablet Take 1 tablet by mouth once daily 90 tablet 1   amLODipine (NORVASC) 5 MG tablet Take 1 tablet by mouth once daily 90 tablet 1   furosemide (LASIX) 40 MG tablet Take 1 tablet by mouth once daily 90 tablet 3   HYDROcodone-acetaminophen (NORCO) 5-325 MG tablet Take 1 tablet by mouth every 12 (twelve) hours as needed for moderate pain. 60 tablet 0   potassium chloride SA (KLOR-CON M) 20 MEQ tablet Take 1 tablet by mouth once daily 90 tablet 2   predniSONE (DELTASONE) 20 MG tablet Take 1 tablet (20 mg total) by mouth daily with breakfast. 90 tablet 0   tamsulosin (FLOMAX) 0.4 MG CAPS capsule TAKE 1 CAPSULE BY MOUTH ONCE DAILY AFTER BREAKFAST 90 capsule 0   traZODone (DESYREL) 50 MG tablet TAKE 1 TABLET BY MOUTH AT BEDTIME AS NEEDED 90 tablet 0   triamcinolone cream (KENALOG) 0.1 % APPLY  CREAM EXTERNALLY TO AFFECTED AREA TWICE DAILY 80 g 0   VITAMIN D PO Take 1 tablet by mouth daily.     warfarin (COUMADIN) 5 MG tablet TAKE 1/2 TABLET BY MOUTH  DAILY EXCEPT TAKE 1 TABLET ON MONDAYS OR AS  DIRECTED  BY  ANTICOAGULATION  CLINIC 75 tablet 1   HYDROcodone-acetaminophen (NORCO) 5-325 MG tablet Take 1 tablet by mouth every 12 (twelve) hours as needed for moderate pain. 60 tablet 0   HYDROcodone-acetaminophen (NORCO) 5-325 MG tablet Take 1 tablet by mouth every 12 (twelve) hours as needed for moderate pain. 60 tablet 0   No facility-administered medications prior to visit.       Objective:   Physical Exam:  General appearance: 87 y.o., male, NAD, conversant  Eyes: anicteric sclerae; PERRL, tracking appropriately HENT: NCAT; MMM Neck: Trachea midline; no lymphadenopathy, no JVD Lungs: diminished bl, with normal respiratory effort CV: RRR, no murmur  Abdomen: Soft, non-tender; non-distended, BS present  Extremities: trace peripheral edema, warm Skin: Normal turgor and texture; no rash Psych: Appropriate affect Neuro: Alert and oriented to person and place, no focal  deficit   [place] left pleural space performed/interpreted by me with moderate simple appearing pleural effusion:   [place] right pleural space performed/interpreted by me with small/moderate  simple appearing pleural effusion:      Vitals:   01/15/23 0935  BP: 132/70  Pulse: 88  Temp: 97.8 F (36.6 C)  TempSrc: Oral  SpO2: 96%  Weight: 167 lb (75.8 kg)  Height: 5\' 6"  (1.676 m)   96% on  RA BMI Readings from Last 3 Encounters:  01/15/23 26.95 kg/m  12/01/22 26.15 kg/m  11/02/22 26.79 kg/m   Wt Readings from Last 3 Encounters:  01/15/23 167 lb (75.8 kg)  12/01/22 162 lb (73.5 kg)  11/02/22 161 lb (73 kg)     CBC    Component Value Date/Time   WBC 7.4 12/01/2022 1128   RBC 3.67 (L) 12/01/2022 1128   HGB 12.2 (L) 12/01/2022 1128   HGB 12.8 (L) 06/07/2022 0939   HCT 36.3 (L) 12/01/2022 1128   HCT 37.2 (L) 06/07/2022 0939   PLT 203 12/01/2022 1128   PLT 204 06/07/2022 0939   MCV 98.9 12/01/2022 1128   MCV 95 06/07/2022 0939   MCH 33.2 12/01/2022 1128   MCHC 33.6 12/01/2022 1128   RDW 13.5 12/01/2022 1128   RDW 12.6 06/07/2022 0939   LYMPHSABS 1.3 10/26/2022 1051   LYMPHSABS 1.1 07/09/2017 1229   MONOABS 0.7 10/26/2022 1051   EOSABS 0.1 10/26/2022 1051   EOSABS 0.0 07/09/2017 1229   BASOSABS 0.1 10/26/2022 1051   BASOSABS 0.0 07/09/2017 1229    Chest Imaging: CXR today with bilateral small effusions and RLL and LLL alveolar opacities. (LLL may simply be due to veiling effusion)  Pulmonary Functions Testing Results:     No data to display            Assessment & Plan:   # DOE # Bilateral pleural effusions  Would first want to see how he responds to diuretic challenge as below. 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   Plan: - Offered to perform dx/therapeutic thoracentesis today though he would be at higher than average risk for bleeding (IR guidelines support uninterrupted use of AC for this kind of low risk procedure). Nevertheless he declines. - BMP, BNP, TTE - trial lasix 40 mg twice daily for a week, potassium 20 mEq twice daily for a week then resume usual dosing - BMP, CXR next week - may still need dx/tx thoracentesis with bridging or performed with uninterrupted anticoagulation - operator/pt preference     Omar Person, MD [facility] 01/15/2023 10:01 AM

## 2023-01-15 NOTE — Telephone Encounter (Signed)
Closing encounter since patient has OV today

## 2023-01-15 NOTE — Patient Instructions (Addendum)
-   labs today - take lasix (furosemide 40 mg twice daily) AND potassium 20 mEq twice daily for 1 week, then resume usual once daily dosing  - if you feel lightheaded or dizzy after making this change then resume usual once daily dosing even before this first week of dose change - echo of your heart, you'll be called to schedule - see you in a week with CXR

## 2023-01-16 ENCOUNTER — Other Ambulatory Visit: Payer: Self-pay | Admitting: Internal Medicine

## 2023-01-16 ENCOUNTER — Ambulatory Visit: Payer: Medicare Other

## 2023-01-16 ENCOUNTER — Telehealth: Payer: Self-pay

## 2023-01-16 ENCOUNTER — Telehealth: Payer: Self-pay | Admitting: Internal Medicine

## 2023-01-16 DIAGNOSIS — M159 Polyosteoarthritis, unspecified: Secondary | ICD-10-CM

## 2023-01-16 LAB — BRAIN NATRIURETIC PEPTIDE: Pro B Natriuretic peptide (BNP): 521 pg/mL — ABNORMAL HIGH (ref 0.0–100.0)

## 2023-01-16 MED ORDER — TRAMADOL HCL 50 MG PO TABS
50.0000 mg | ORAL_TABLET | Freq: Two times a day (BID) | ORAL | 0 refills | Status: DC | PRN
Start: 2023-01-16 — End: 2023-02-15

## 2023-01-16 NOTE — Telephone Encounter (Signed)
Pt called to [facility] coumadin clinic due to SOB and fatigue. Pt had OV with pulmonary yesterday and they reported small pleural effusions on right and left lungs. They have increased dosage of lasix to BID. Pt reports he will call at the end of the week to see if he is feeling better to come in on Friday this week. Advised this nurse will be out of the office next week but if anything is needed to contact PCP office. Pt verbalized understanding.   Cancelled coumadin clinic apt for today.

## 2023-01-16 NOTE — Telephone Encounter (Signed)
Pt called to say he thinks the HYDROcodone-acetaminophen (NORCO) 5-325 MG tablet is too strong and he would like to go back to the Tramadol.  Pt states he only has a few pills left of the Hydrocodone, but is not sure if he will take them, and will wait for the Tramadol.  Pt would like this new Rx for the Tramadol sent to:  [facility] [address] [place] - [address] Phone: [phone]  Fax: [phone]

## 2023-01-16 NOTE — Telephone Encounter (Signed)
Spoke with patient and he prefers tramadol 50 mg BID

## 2023-01-19 ENCOUNTER — Ambulatory Visit (INDEPENDENT_AMBULATORY_CARE_PROVIDER_SITE_OTHER): Payer: Medicare Other

## 2023-01-19 ENCOUNTER — Ambulatory Visit (HOSPITAL_COMMUNITY): Payer: Medicare Other | Attending: Cardiology

## 2023-01-19 DIAGNOSIS — I340 Nonrheumatic mitral (valve) insufficiency: Secondary | ICD-10-CM | POA: Diagnosis not present

## 2023-01-19 DIAGNOSIS — R0609 Other forms of dyspnea: Secondary | ICD-10-CM | POA: Diagnosis not present

## 2023-01-19 DIAGNOSIS — Z7901 Long term (current) use of anticoagulants: Secondary | ICD-10-CM

## 2023-01-19 LAB — ECHOCARDIOGRAM COMPLETE
AR max vel: 1.17 cm2
AV Area VTI: 1.13 cm2
AV Area mean vel: 1.11 cm2
AV Mean grad: 16 mmHg
AV Peak grad: 28.1 mmHg
Ao pk vel: 2.65 m/s
P 1/2 time: 461 ms
S' Lateral: 3.6 cm

## 2023-01-19 LAB — POCT INR: INR: 1.7 — AB (ref 2.0–3.0)

## 2023-01-19 NOTE — Progress Notes (Addendum)
Pt also taking prednisone 10 mg QD. Pt reports pulmonary increased dose of lasix and K. And urology increased tamsulosin. Increase dose today to take 1 tablet and the continue 1/2 tablet daily. Recheck in 3 weeks.

## 2023-01-19 NOTE — Patient Instructions (Addendum)
Pre visit review using our clinic review tool, if applicable. No additional management support is needed unless otherwise documented below in the visit note.  Increase dose today to take 1 tablet and the continue 1/2 tablet daily. Recheck in 3 weeks.

## 2023-01-23 NOTE — Progress Notes (Unsigned)
Synopsis: Referred for dyspnea on exertion by Philip Aspen, [place]*  Subjective:   PATIENT ID: [name] GENDER: male DOB: 27-Apr-1936, MRN: [id number]  No chief complaint on file.   [name] is an 87 year old male, former smoker with aortic valve replacement and atrial fibrillation on coumadin, RA chronically on 10 mg prednisone daily, HP (raised chickens, HP panel with A pullulans Abs (though these assd with humidifier/AC) improved with low dose prednisone taper), RUL NSCLC sp SBRT 2022  He says that he 'just cannot breathe' - has been going on since Friday night/Saturday morning. He has no cough, fever. No hemoptysis and he is still on warfarin. No CP.   He is on prednisone 10 mg daily. He is on lasix 40 mg daily, not changed recently. His weight is stable relative to last year.   He had bronchoscopy 09/21/20 with PJP DFA negative. Cell count/diff from BAL 91% lymphs.  Interval HPI Last visit started on lasix 40 bid  Otherwise pertinent review of systems is negative.  Past Medical History:  Diagnosis Date   Aortic stenosis    Atrial fibrillation (HCC)    Baker cyst    Left knee   DJD (degenerative joint disease)    knees   Dysrhythmia    Eczema    Erectile dysfunction    Gout    History of hiatal hernia    History of radiation therapy    Right lung- 04/21/21-05/03/21 Dr. Antony Blackbird   HTN (hypertension)    MVP (mitral valve prolapse)    Shortness of breath dyspnea    with activity     Family History  Problem Relation Age of Onset   Diabetes Other    Stroke Other      Past Surgical History:  Procedure Laterality Date    polyps vocal cord  1994   AORTIC VALVE REPLACEMENT  ~2004   BRONCHIAL BIOPSY  03/07/2021   Procedure: BRONCHIAL BIOPSIES;  Surgeon: Leslye Peer, MD;  Location: [facility] ENDOSCOPY;  Service: Pulmonary;;   BRONCHIAL BIOPSY  11/14/2021   Procedure: BRONCHIAL BIOPSIES;  Surgeon: Leslye Peer, MD;  Location: [facility] ENDOSCOPY;  Service:  Pulmonary;;   BRONCHIAL BRUSHINGS  03/07/2021   Procedure: BRONCHIAL BRUSHINGS;  Surgeon: Leslye Peer, MD;  Location: [facility] ENDOSCOPY;  Service: Pulmonary;;   BRONCHIAL BRUSHINGS  11/14/2021   Procedure: BRONCHIAL BRUSHINGS;  Surgeon: Leslye Peer, MD;  Location: [facility] ENDOSCOPY;  Service: Pulmonary;;   BRONCHIAL NEEDLE ASPIRATION BIOPSY  03/07/2021   Procedure: BRONCHIAL NEEDLE ASPIRATION BIOPSIES;  Surgeon: Leslye Peer, MD;  Location: [facility] ENDOSCOPY;  Service: Pulmonary;;   BRONCHIAL NEEDLE ASPIRATION BIOPSY  11/14/2021   Procedure: BRONCHIAL NEEDLE ASPIRATION BIOPSIES;  Surgeon: Leslye Peer, MD;  Location: [facility] ENDOSCOPY;  Service: Pulmonary;;   BRONCHIAL WASHINGS  09/21/2020   Procedure: BRONCHIAL WASHINGS;  Surgeon: Martina Sinner, MD;  Location: [facility] ENDOSCOPY;  Service: Pulmonary;;  BAL    BRONCHIAL WASHINGS  03/07/2021   Procedure: BRONCHIAL WASHINGS;  Surgeon: Leslye Peer, MD;  Location: [facility] ENDOSCOPY;  Service: Pulmonary;;   BRONCHIAL WASHINGS  11/14/2021   Procedure: BRONCHIAL WASHINGS;  Surgeon: Leslye Peer, MD;  Location: [facility] ENDOSCOPY;  Service: Pulmonary;;   FIDUCIAL MARKER PLACEMENT  03/07/2021   Procedure: FIDUCIAL MARKER PLACEMENT;  Surgeon: Leslye Peer, MD;  Location: [facility] ENDOSCOPY;  Service: Pulmonary;;   HERNIA REPAIR  ~1978   HERNIA REPAIR  ~1983   IR ANGIOGRAM PELVIS SELECTIVE OR SUPRASELECTIVE  12/01/2022   IR ANGIOGRAM PELVIS SELECTIVE OR SUPRASELECTIVE  12/01/2022   IR ANGIOGRAM SELECTIVE EACH ADDITIONAL VESSEL  12/01/2022   IR ANGIOGRAM SELECTIVE EACH ADDITIONAL VESSEL  12/01/2022   IR CT SPINE LTD  12/01/2022   IR CT SPINE LTD  12/01/2022   IR CT SPINE LTD  12/01/2022   IR EMBO ARTERIAL NOT HEMORR HEMANG INC GUIDE ROADMAPPING  12/01/2022   IR EMBO TUMOR ORGAN ISCHEMIA INFARCT INC GUIDE ROADMAPPING  12/01/2022   IR RADIOLOGIST EVAL & MGMT  11/17/2022   IR US GUIDE VASC ACCESS LEFT  12/01/2022   IR US GUIDE VASC ACCESS RIGHT  12/01/2022   LUMBAR LAMINECTOMY/DECOMPRESSION  MICRODISCECTOMY N/A 01/15/2015   Procedure: 2 LEVEL DECOMPRESSIVE LUMBAR LAMINECTOMY L3-L4,L4-L5;  Surgeon: Ranee Gosselin, MD;  Location: [facility] ORS;  Service: Orthopedics;  Laterality: N/A;   LUMBAR LAMINECTOMY/DECOMPRESSION MICRODISCECTOMY N/A 05/01/2017   Procedure: Lumbar one-Lumbar two, Lumbar two-Lumbar three, Lumbar three-Lumbar four Laminectomy; Evacuation of hematoma;  Surgeon: Lisbeth Renshaw, MD;  Location: [facility] OR;  Service: Neurosurgery;  Laterality: N/A;   right total hip  ~4 years ago   ROTATOR CUFF REPAIR Left ~2009   TOTAL KNEE ARTHROPLASTY Left 04/14/2015   Procedure: LEFT TOTAL KNEE ARTHROPLASTY;  Surgeon: Ranee Gosselin, MD;  Location: [facility] ORS;  Service: Orthopedics;  Laterality: Left;   VIDEO BRONCHOSCOPY N/A 09/21/2020   Procedure: VIDEO BRONCHOSCOPY WITHOUT FLUORO;  Surgeon: Martina Sinner, MD;  Location: [facility] ENDOSCOPY;  Service: Pulmonary;  Laterality: N/A;   VIDEO BRONCHOSCOPY WITH ENDOBRONCHIAL NAVIGATION Right 03/07/2021   Procedure: VIDEO BRONCHOSCOPY WITH ENDOBRONCHIAL NAVIGATION;  Surgeon: Leslye Peer, MD;  Location: [facility] ENDOSCOPY;  Service: Pulmonary;  Laterality: Right;   VIDEO BRONCHOSCOPY WITH RADIAL ENDOBRONCHIAL ULTRASOUND  11/14/2021   Procedure: VIDEO BRONCHOSCOPY WITH RADIAL ENDOBRONCHIAL ULTRASOUND;  Surgeon: Leslye Peer, MD;  Location: [facility] ENDOSCOPY;  Service: Pulmonary;;    Social History   Socioeconomic History   Marital status: Divorced    Spouse name: Not on file   Number of children: Not on file   Years of education: Not on file   Highest education level: Not on file  Occupational History   Not on file  Tobacco Use   Smoking status: Former    Years: 30    Types: Cigarettes    Quit date: 08/28/2002    Years since quitting: 20.4   Smokeless tobacco: Never  Vaping Use   Vaping Use: Never used  Substance and Sexual Activity   Alcohol use: Yes    Alcohol/week: 3.0 standard drinks of alcohol    Types: 3 Shots of liquor per week    Comment:  social   Drug use: No   Sexual activity: Not on file  Other Topics Concern   Not on file  Social History Narrative   Not on file   Social Determinants of Health   Financial Resource Strain: Low Risk  (10/26/2022)   Overall Financial Resource Strain (CARDIA)    Difficulty of Paying Living Expenses: Not hard at all  Food Insecurity: No Food Insecurity (10/26/2022)   Hunger Vital Sign    Worried About Running Out of Food in the Last Year: Never true    Ran Out of Food in the Last Year: Never true  Transportation Needs: No Transportation Needs (10/26/2022)   PRAPARE - Administrator, Civil Service (Medical): No    Lack of Transportation (Non-Medical): No  Physical Activity: Insufficiently Active (10/26/2022)   Exercise Vital Sign    Days  of Exercise per Week: 3 days    Minutes of Exercise per Session: 30 min  Stress: No Stress Concern Present (10/26/2022)   [employer] of Occupational Health - Occupational Stress Questionnaire    Feeling of Stress : Not at all  Social Connections: Moderately Isolated (10/26/2022)   Social Connection and Isolation Panel [NHANES]    Frequency of Communication with Friends and Family: More than three times a week    Frequency of Social Gatherings with Friends and Family: Once a week    Attends Religious Services: More than 4 times per year    Active Member of [employer] or Organizations: No    Attends Banker Meetings: Never    Marital Status: Divorced  Catering manager Violence: Not At Risk (10/26/2022)   Humiliation, Afraid, Rape, and Kick questionnaire    Fear of Current or Ex-Partner: No    Emotionally Abused: No    Physically Abused: No    Sexually Abused: No     No Known Allergies   Outpatient Medications Prior to Visit  Medication Sig Dispense Refill   traMADol (ULTRAM) 50 MG tablet Take 1 tablet (50 mg total) by mouth every 12 (twelve) hours as needed. 60 tablet 0   allopurinol (ZYLOPRIM) 300 MG tablet Take 1 tablet  by mouth once daily 90 tablet 1   amLODipine (NORVASC) 5 MG tablet Take 1 tablet by mouth once daily 90 tablet 1   furosemide (LASIX) 40 MG tablet Take 1 tablet by mouth once daily 90 tablet 3   furosemide (LASIX) 40 MG tablet Take 1 tablet (40 mg total) by mouth 2 (two) times daily. 14 tablet 0   potassium chloride SA (KLOR-CON M) 20 MEQ tablet Take 1 tablet by mouth once daily 90 tablet 2   potassium chloride SA (KLOR-CON M) 20 MEQ tablet Take 1 tablet (20 mEq total) by mouth 2 (two) times daily. 14 tablet 0   predniSONE (DELTASONE) 20 MG tablet Take 1 tablet (20 mg total) by mouth daily with breakfast. 90 tablet 0   tamsulosin (FLOMAX) 0.4 MG CAPS capsule TAKE 1 CAPSULE BY MOUTH ONCE DAILY AFTER BREAKFAST 90 capsule 0   traZODone (DESYREL) 50 MG tablet TAKE 1 TABLET BY MOUTH AT BEDTIME AS NEEDED 90 tablet 0   triamcinolone cream (KENALOG) 0.1 % APPLY  CREAM EXTERNALLY TO AFFECTED AREA TWICE DAILY 80 g 0   VITAMIN D PO Take 1 tablet by mouth daily.     warfarin (COUMADIN) 5 MG tablet TAKE 1/2 TABLET BY MOUTH  DAILY EXCEPT TAKE 1 TABLET ON MONDAYS OR AS  DIRECTED  BY  ANTICOAGULATION  CLINIC 75 tablet 1   No facility-administered medications prior to visit.       Objective:   Physical Exam:  General appearance: 87 y.o., male, NAD, conversant  Eyes: anicteric sclerae; PERRL, tracking appropriately HENT: NCAT; MMM Neck: Trachea midline; no lymphadenopathy, no JVD Lungs: diminished bl, with normal respiratory effort CV: RRR, no murmur  Abdomen: Soft, non-tender; non-distended, BS present  Extremities: trace peripheral edema, warm Skin: Normal turgor and texture; no rash Psych: Appropriate affect Neuro: Alert and oriented to person and place, no focal deficit   [place] left pleural space performed/interpreted by me with moderate simple appearing pleural effusion:   [place] right pleural space performed/interpreted by me with small/moderate simple appearing pleural effusion:      There  were no vitals filed for this visit.    on  RA BMI Readings from Last 3 Encounters:  01/15/23 26.95 kg/m  12/01/22 26.15 kg/m  11/02/22 26.79 kg/m   Wt Readings from Last 3 Encounters:  01/15/23 167 lb (75.8 kg)  12/01/22 162 lb (73.5 kg)  11/02/22 161 lb (73 kg)     CBC    Component Value Date/Time   WBC 7.4 12/01/2022 1128   RBC 3.67 (L) 12/01/2022 1128   HGB 12.2 (L) 12/01/2022 1128   HGB 12.8 (L) 06/07/2022 0939   HCT 36.3 (L) 12/01/2022 1128   HCT 37.2 (L) 06/07/2022 0939   PLT 203 12/01/2022 1128   PLT 204 06/07/2022 0939   MCV 98.9 12/01/2022 1128   MCV 95 06/07/2022 0939   MCH 33.2 12/01/2022 1128   MCHC 33.6 12/01/2022 1128   RDW 13.5 12/01/2022 1128   RDW 12.6 06/07/2022 0939   LYMPHSABS 1.3 10/26/2022 1051   LYMPHSABS 1.1 07/09/2017 1229   MONOABS 0.7 10/26/2022 1051   EOSABS 0.1 10/26/2022 1051   EOSABS 0.0 07/09/2017 1229   BASOSABS 0.1 10/26/2022 1051   BASOSABS 0.0 07/09/2017 1229    Chest Imaging: CXR today with bilateral small effusions and RLL and LLL alveolar opacities. (LLL may simply be due to veiling effusion)  Pulmonary Functions Testing Results:     No data to display            Assessment & Plan:   # DOE # Bilateral pleural effusions  Would first want to see how he responds to diuretic challenge as below. Also has history of suspected HP (raised chickens, A pullulans Ab [though fungus assd with humidifier/AC], BAL lymphocytes 91% in 2022) - HP flare possible though lower lobe predominant abnormalities make this probably less likely, aspiration pneumonia though he has no trouble swallowing or cough/fever, organizing pna related to RA, or PJP though DFA negative in 2022 and he's mostly been on low dose prednisone.   Plan: - Offered to perform dx/therapeutic thoracentesis today though he would be at higher than average risk for bleeding (IR guidelines support uninterrupted use of AC for this kind of low risk procedure). Nevertheless  he declines. - BMP, BNP, TTE - trial lasix 40 mg twice daily for a week, potassium 20 mEq twice daily for a week then resume usual dosing - BMP, CXR next week - may still need dx/tx thoracentesis with bridging or performed with uninterrupted anticoagulation - operator/pt preference     Omar Person, MD [facility] 01/23/2023 11:37 AM

## 2023-01-24 ENCOUNTER — Ambulatory Visit: Payer: Medicare Other | Admitting: Student

## 2023-01-24 ENCOUNTER — Ambulatory Visit (INDEPENDENT_AMBULATORY_CARE_PROVIDER_SITE_OTHER): Payer: Medicare Other

## 2023-01-24 ENCOUNTER — Encounter: Payer: Self-pay | Admitting: Student

## 2023-01-24 VITALS — BP 144/84 | HR 81 | Temp 97.8°F | Ht 66.0 in | Wt 164.0 lb

## 2023-01-24 DIAGNOSIS — J90 Pleural effusion, not elsewhere classified: Secondary | ICD-10-CM

## 2023-01-24 DIAGNOSIS — J439 Emphysema, unspecified: Secondary | ICD-10-CM | POA: Diagnosis not present

## 2023-01-24 DIAGNOSIS — R0609 Other forms of dyspnea: Secondary | ICD-10-CM

## 2023-01-24 NOTE — Patient Instructions (Addendum)
-   resume lasix once daily, potassium once daily. Measure your weight daily in exactly the same way everyday. If your weight increases by 3 lb in one day or if it increases by 5 lb in a week then would take lasix 40 mg twice daily along with potassium 20 mEq twice daily until you reach the weight that you measure today.  - keep appointment with Dr. Francine Graven in September, CT Chest before that appointment.

## 2023-02-06 ENCOUNTER — Other Ambulatory Visit: Payer: Self-pay | Admitting: Internal Medicine

## 2023-02-06 DIAGNOSIS — R338 Other retention of urine: Secondary | ICD-10-CM | POA: Diagnosis not present

## 2023-02-08 ENCOUNTER — Other Ambulatory Visit: Payer: Self-pay | Admitting: *Deleted

## 2023-02-08 DIAGNOSIS — Z7901 Long term (current) use of anticoagulants: Secondary | ICD-10-CM

## 2023-02-08 MED ORDER — WARFARIN SODIUM 5 MG PO TABS
ORAL_TABLET | ORAL | 1 refills | Status: DC
Start: 2023-02-08 — End: 2023-07-06

## 2023-02-08 NOTE — Telephone Encounter (Signed)
Pt is compliant with warfarin management and PCP apts.  Sent in refill of warfarin to requested pharmacy.      

## 2023-02-09 ENCOUNTER — Ambulatory Visit (INDEPENDENT_AMBULATORY_CARE_PROVIDER_SITE_OTHER): Payer: Medicare Other

## 2023-02-09 DIAGNOSIS — Z7901 Long term (current) use of anticoagulants: Secondary | ICD-10-CM | POA: Diagnosis not present

## 2023-02-09 LAB — POCT INR: INR: 2.5 (ref 2.0–3.0)

## 2023-02-09 NOTE — Patient Instructions (Addendum)
Pre visit review using our clinic review tool, if applicable. No additional management support is needed unless otherwise documented below in the visit note.  Continue 1/2 tablet daily. Recheck in 6 weeks. 

## 2023-02-09 NOTE — Progress Notes (Addendum)
Pt also taking prednisone 10 mg QD. Pt reports pulmonary increased dose of lasix and K. And urology decreased tamsulosin dosage to QD. Continue 1/2 tablet daily. Recheck in 6 weeks.

## 2023-02-14 ENCOUNTER — Other Ambulatory Visit: Payer: Self-pay | Admitting: Internal Medicine

## 2023-02-14 DIAGNOSIS — M159 Polyosteoarthritis, unspecified: Secondary | ICD-10-CM

## 2023-02-21 ENCOUNTER — Telehealth: Payer: Self-pay | Admitting: Pulmonary Disease

## 2023-02-21 NOTE — Telephone Encounter (Signed)
PT was to FU in Sept to see Dr. Francine Graven. Wonders if he will need yet another CT scan since he had one recently. Main concern is cost.  Please call to advise. [phone]  When you call you can send back for [place] (front) to make a Sept appt.

## 2023-02-27 NOTE — Telephone Encounter (Signed)
Called patient but he did not answer. Left message for him to call us back.  

## 2023-02-27 NOTE — Telephone Encounter (Signed)
Patient is returning phone call. Patient phone number is [phone].

## 2023-03-06 NOTE — Telephone Encounter (Signed)
Please try again so I can close the encounter.

## 2023-03-12 ENCOUNTER — Ambulatory Visit: Payer: Medicare Other | Admitting: Nurse Practitioner

## 2023-03-12 NOTE — Progress Notes (Deleted)
Office Visit    Patient Name: [name] Date of Encounter: 03/12/2023  Primary Care Provider:  Philip Aspen, Limmie Patricia, MD Primary Cardiologist:  Olga Millers, MD  Chief Complaint    87 year old male with a history of permanent atrial fibrillation, aortic stenosis s/p aortic valve replacement, aortic aneurysm repair in 2006, chronic diastolic heart failure, bradycardia, hypertension, and lung cancer who presents for follow-up related to aortic stenosis, heart failure, and atrial fibrillation.  Past Medical History    Past Medical History:  Diagnosis Date   Aortic stenosis    Atrial fibrillation (HCC)    Baker cyst    Left knee   DJD (degenerative joint disease)    knees   Dysrhythmia    Eczema    Erectile dysfunction    Gout    History of hiatal hernia    History of radiation therapy    Right lung- 04/21/21-05/03/21 Dr. Antony Blackbird   HTN (hypertension)    MVP (mitral valve prolapse)    Shortness of breath dyspnea    with activity   Past Surgical History:  Procedure Laterality Date    polyps vocal cord  1994   AORTIC VALVE REPLACEMENT  ~2004   BRONCHIAL BIOPSY  03/07/2021   Procedure: BRONCHIAL BIOPSIES;  Surgeon: Leslye Peer, MD;  Location: [facility];  Service: Pulmonary;;   BRONCHIAL BIOPSY  11/14/2021   Procedure: BRONCHIAL BIOPSIES;  Surgeon: Leslye Peer, MD;  Location: [facility];  Service: Pulmonary;;   BRONCHIAL BRUSHINGS  03/07/2021   Procedure: BRONCHIAL BRUSHINGS;  Surgeon: Leslye Peer, MD;  Location: [facility];  Service: Pulmonary;;   BRONCHIAL BRUSHINGS  11/14/2021   Procedure: BRONCHIAL BRUSHINGS;  Surgeon: Leslye Peer, MD;  Location: [facility];  Service: Pulmonary;;   BRONCHIAL NEEDLE ASPIRATION BIOPSY  03/07/2021   Procedure: BRONCHIAL NEEDLE ASPIRATION BIOPSIES;  Surgeon: Leslye Peer, MD;  Location: [facility];  Service: Pulmonary;;   BRONCHIAL NEEDLE ASPIRATION BIOPSY  11/14/2021   Procedure: BRONCHIAL NEEDLE  ASPIRATION BIOPSIES;  Surgeon: Leslye Peer, MD;  Location: [facility];  Service: Pulmonary;;   BRONCHIAL WASHINGS  09/21/2020   Procedure: BRONCHIAL WASHINGS;  Surgeon: Martina Sinner, MD;  Location: [facility];  Service: Pulmonary;;  BAL    BRONCHIAL WASHINGS  03/07/2021   Procedure: BRONCHIAL WASHINGS;  Surgeon: Leslye Peer, MD;  Location: [facility];  Service: Pulmonary;;   BRONCHIAL WASHINGS  11/14/2021   Procedure: BRONCHIAL WASHINGS;  Surgeon: Leslye Peer, MD;  Location: [facility];  Service: Pulmonary;;   FIDUCIAL MARKER PLACEMENT  03/07/2021   Procedure: FIDUCIAL MARKER PLACEMENT;  Surgeon: Leslye Peer, MD;  Location: [facility];  Service: Pulmonary;;   HERNIA REPAIR  [phone]   HERNIA REPAIR  ~1983   IR ANGIOGRAM PELVIS SELECTIVE OR SUPRASELECTIVE  12/01/2022   IR ANGIOGRAM PELVIS SELECTIVE OR SUPRASELECTIVE  12/01/2022   IR ANGIOGRAM SELECTIVE EACH ADDITIONAL VESSEL  12/01/2022   IR ANGIOGRAM SELECTIVE EACH ADDITIONAL VESSEL  12/01/2022   IR CT SPINE LTD  12/01/2022   IR CT SPINE LTD  12/01/2022   IR CT SPINE LTD  12/01/2022   IR EMBO ARTERIAL NOT HEMORR HEMANG INC GUIDE ROADMAPPING  12/01/2022   IR EMBO TUMOR ORGAN ISCHEMIA INFARCT INC GUIDE ROADMAPPING  12/01/2022   IR RADIOLOGIST EVAL & MGMT  11/17/2022   IR US GUIDE VASC ACCESS LEFT  12/01/2022   IR US GUIDE VASC ACCESS RIGHT  12/01/2022   LUMBAR LAMINECTOMY/DECOMPRESSION MICRODISCECTOMY N/A 01/15/2015  Procedure: 2 LEVEL DECOMPRESSIVE LUMBAR LAMINECTOMY L3-L4,L4-L5;  Surgeon: Ranee Gosselin, MD;  Location: [facility];  Service: Orthopedics;  Laterality: N/A;   LUMBAR LAMINECTOMY/DECOMPRESSION MICRODISCECTOMY N/A 05/01/2017   Procedure: Lumbar one-Lumbar two, Lumbar two-Lumbar three, Lumbar three-Lumbar four Laminectomy; Evacuation of hematoma;  Surgeon: Lisbeth Renshaw, MD;  Location: [facility];  Service: Neurosurgery;  Laterality: N/A;   right total hip  ~4 years ago   ROTATOR CUFF REPAIR Left ~2009   TOTAL KNEE ARTHROPLASTY  Left 04/14/2015   Procedure: LEFT TOTAL KNEE ARTHROPLASTY;  Surgeon: Ranee Gosselin, MD;  Location: [facility];  Service: Orthopedics;  Laterality: Left;   VIDEO BRONCHOSCOPY N/A 09/21/2020   Procedure: VIDEO BRONCHOSCOPY WITHOUT FLUORO;  Surgeon: Martina Sinner, MD;  Location: [facility];  Service: Pulmonary;  Laterality: N/A;   VIDEO BRONCHOSCOPY WITH ENDOBRONCHIAL NAVIGATION Right 03/07/2021   Procedure: VIDEO BRONCHOSCOPY WITH ENDOBRONCHIAL NAVIGATION;  Surgeon: Leslye Peer, MD;  Location[facility];  Service: Pulmonary;  Laterality: Right;   VIDEO BRONCHOSCOPY WITH RADIAL ENDOBRONCHIAL ULTRASOUND  11/14/2021   Procedure: VIDEO BRONCHOSCOPY WITH RADIAL ENDOBRONCHIAL ULTRASOUND;  Surgeon: Leslye Peer, MD;  Location: [facility];  Service: Pulmonary;;    Allergies  No Known Allergies   Labs/Other Studies Reviewed    The following studies were reviewed today:  Cardiac Studies & Procedures       ECHOCARDIOGRAM  ECHOCARDIOGRAM COMPLETE 01/19/2023  Narrative ECHOCARDIOGRAM REPORT    Patient Name:   [name] Date of Exam: 01/19/2023 Medical Rec #:  [id number]       Height:       66.0 in Accession #:    [id number]      Weight:       167.0 lb Date of Birth:  [birth year]        BSA:          1.852 m Patient Age:    87 years        BP:           132/70 mmHg Patient Gender: M               HR:           82 bpm. Exam Location:  [address]  Procedure: 2D Echo, Cardiac Doppler, Color Doppler and 3D Echo  Indications:    S/P Aortic Valve replacement Z95.2  History:        Patient has prior history of Echocardiogram examinations, most recent 01/19/2021. Arrythmias:Atrial Fibrillation; Risk Factors:Hypertension. Aortic Valve: bioprosthetic valve is present in the aortic position. Procedure Date: 02/2005.  Sonographer:    Thurman Coyer RDCS Referring Phys: [id number] Ivor Costa MEIER  IMPRESSIONS   1. S/P AVR with mean gradient 16 mmHg and mild AI. 2. Left  ventricular ejection fraction, by estimation, is 70 to 75%. The left ventricle has hyperdynamic function. The left ventricle has no regional wall motion abnormalities. There is severe left ventricular hypertrophy. Left ventricular diastolic parameters are indeterminate. 3. Right ventricular systolic function is normal. The right ventricular size is normal. There is mildly elevated pulmonary artery systolic pressure. 4. Left atrial size was severely dilated. 5. Right atrial size was severely dilated. 6. The mitral valve is normal in structure. Mild mitral valve regurgitation. No evidence of mitral stenosis. 7. The aortic valve has been repaired/replaced. Aortic valve regurgitation is mild. No aortic stenosis is present. There is a bioprosthetic valve present in the aortic position. Procedure Date: 02/2005. 8. The inferior vena cava is normal in size with greater  than 50% respiratory variability, suggesting right atrial pressure of 3 mmHg.  FINDINGS Left Ventricle: Left ventricular ejection fraction, by estimation, is 70 to 75%. The left ventricle has hyperdynamic function. The left ventricle has no regional wall motion abnormalities. The left ventricular internal cavity size was normal in size. There is severe left ventricular hypertrophy. Left ventricular diastolic parameters are indeterminate.  Right Ventricle: The right ventricular size is normal. Right ventricular systolic function is normal. There is mildly elevated pulmonary artery systolic pressure. The tricuspid regurgitant velocity is 2.98 m/s, and with an assumed right atrial pressure of 3 mmHg, the estimated right ventricular systolic pressure is 38.5 mmHg.  Left Atrium: Left atrial size was severely dilated.  Right Atrium: Right atrial size was severely dilated.  Pericardium: There is no evidence of pericardial effusion.  Mitral Valve: The mitral valve is normal in structure. Mild mitral valve regurgitation. No evidence of mitral  valve stenosis.  Tricuspid Valve: The tricuspid valve is normal in structure. Tricuspid valve regurgitation is mild . No evidence of tricuspid stenosis.  Aortic Valve: The aortic valve has been repaired/replaced. Aortic valve regurgitation is mild. Aortic regurgitation PHT measures 461 msec. No aortic stenosis is present. Aortic valve mean gradient measures 16.0 mmHg. Aortic valve peak gradient measures 28.1 mmHg. Aortic valve area, by VTI measures 1.13 cm. There is a bioprosthetic valve present in the aortic position. Procedure Date: 02/2005.  Pulmonic Valve: The pulmonic valve was normal in structure. Pulmonic valve regurgitation is mild. No evidence of pulmonic stenosis.  Aorta: The aortic root is normal in size and structure.  Venous: The inferior vena cava is normal in size with greater than 50% respiratory variability, suggesting right atrial pressure of 3 mmHg.  IAS/Shunts: No atrial level shunt detected by color flow Doppler.  Additional Comments: S/P AVR with mean gradient 16 mmHg and mild AI.   LEFT VENTRICLE PLAX 2D LVIDd:         5.10 cm LVIDs:         3.60 cm LV PW:         1.20 cm LV IVS:        1.20 cm LVOT diam:     2.20 cm   3D Volume EF: LV SV:         67        3D EF:        56 % LV SV Index:   36        LV EDV:       129 ml LVOT Area:     3.80 cm  LV ESV:       56 ml LV SV:        73 ml  RIGHT VENTRICLE RV Basal diam:  3.70 cm RV Mid diam:    2.20 cm RV S prime:     5.68 cm/s TAPSE (M-mode): 1.7 cm  LEFT ATRIUM              Index        RIGHT ATRIUM           Index LA diam:        4.80 cm  2.59 cm/m   RA Area:     38.30 cm LA Vol (A2C):   157.0 ml 84.76 ml/m  RA Volume:   148.00 ml 79.90 ml/m LA Vol (A4C):   95.7 ml  51.67 ml/m LA Biplane Vol: 125.0 ml 67.48 ml/m AORTIC VALVE AV Area (Vmax):    1.17 cm  AV Area (Vmean):   1.11 cm AV Area (VTI):     1.13 cm AV Vmax:           265.00 cm/s AV Vmean:          189.000 cm/s AV VTI:             0.590 m AV Peak Grad:      28.1 mmHg AV Mean Grad:      16.0 mmHg LVOT Vmax:         81.40 cm/s LVOT Vmean:        55.400 cm/s LVOT VTI:          0.175 m LVOT/AV VTI ratio: 0.30 AI PHT:            461 msec  AORTA Ao Root diam: 3.10 cm Ao Asc diam:  3.30 cm  TRICUSPID VALVE TR Peak grad:   35.5 mmHg TR Vmax:        298.00 cm/s  SHUNTS Systemic VTI:  0.18 m Systemic Diam: 2.20 cm  Olga Millers MD Electronically signed by Olga Millers MD Signature Date/Time: 01/19/2023/4:10:01 PM    Final    MONITORS  CARDIAC EVENT MONITOR 01/01/2018          Recent Labs: 10/26/2022: TSH 1.18 12/01/2022: ALT 14; Hemoglobin 12.2; Platelets 203 01/15/2023: BUN 14; Creatinine, Ser 0.98; Potassium 3.7; Pro B Natriuretic peptide (BNP) 521.0; Sodium 141  Recent Lipid Panel    Component Value Date/Time   CHOL 208 (H) 10/26/2022 1051   TRIG 145.0 10/26/2022 1051   HDL 50.50 10/26/2022 1051   CHOLHDL 4 10/26/2022 1051   VLDL 29.0 10/26/2022 1051   LDLCALC 128 (H) 10/26/2022 1051    History of Present Illness    87 year old male with the above past medical history including permanent atrial fibrillation, aortic stenosis s/p aortic valve replacement, aortic aneurysm repair in 2006, chronic diastolic heart failure, bradycardia, hypertension, and lung cancer.  He has a history of severe aortic stenosis s/p AVR with pericardial tissue valve, thoracic aortic aneurysm repair in July 2006.  Preoperative cardiac catheterization revealed normal coronary arteries.  Previous carotid ultrasound in the setting of carotid bruit revealed 0 to 39% bilateral stenosis.  Cardiac monitor in 2019 showed atrial fibrillation with multiple pauses greater than 3 seconds, all occurring in the late p.m. or early a.m. hours.  He declined EP referral.  Echocardiogram in May 2022 showed EF 70 to 75%, moderate biatrial enlargement, mild mitral valve regurgitation, previous aortic valve replacement with no aortic stenosis, mild  aortic insufficiency.  He was last seen in the office on 06/07/2022 and was stable overall from a cardiac standpoint though he did note increased dyspnea on exertion.  BNP was elevated, Lasix was increased to 60 mg daily.  Chest x-ray was abnormal, he was advised to follow-up with pulmonology as an outpatient.  Most recent echocardiogram 12/2022 showed EF 70 to 75%, hyperdynamic LV function, no RWMA, severe LVH, normal RV, mildly elevated PASP, severe BAE, mild mitral valve regurgitation, stable aortic valve replacement with mild aortic valve regurgitation, no evidence of aortic stenosis.  He presents today for follow-up.  Since his last visit  Chronic diastolic heart failure: Atrial fibrillation/bradycardia: Aortic stenosis: Hypertension: Lung cancer: Disposition:  Home Medications    Current Outpatient Medications  Medication Sig Dispense Refill   allopurinol (ZYLOPRIM) 300 MG tablet Take 1 tablet by mouth once daily 90 tablet 1   amLODipine (NORVASC) 5 MG tablet Take 1 tablet by mouth once  daily 90 tablet 1   furosemide (LASIX) 40 MG tablet Take 1 tablet by mouth once daily 90 tablet 3   potassium chloride SA (KLOR-CON M) 20 MEQ tablet Take 1 tablet by mouth once daily 90 tablet 2   predniSONE (DELTASONE) 20 MG tablet Take 1 tablet by mouth once daily with breakfast 90 tablet 0   tamsulosin (FLOMAX) 0.4 MG CAPS capsule TAKE 1 CAPSULE BY MOUTH ONCE DAILY AFTER BREAKFAST 90 capsule 0   traMADol (ULTRAM) 50 MG tablet TAKE 1 TABLET BY MOUTH EVERY 12 HOURS AS NEEDED 60 tablet 0   traZODone (DESYREL) 50 MG tablet TAKE 1 TABLET BY MOUTH AT BEDTIME AS NEEDED 90 tablet 0   triamcinolone cream (KENALOG) 0.1 % APPLY  CREAM EXTERNALLY TO AFFECTED AREA TWICE DAILY 80 g 0   VITAMIN D PO Take 1 tablet by mouth daily.     warfarin (COUMADIN) 5 MG tablet TAKE 1/2 TABLET BY MOUTH  DAILY OR AS  DIRECTED  BY  ANTICOAGULATION  CLINIC 50 tablet 1   No current facility-administered medications for this visit.      Review of Systems    ***.  All other systems reviewed and are otherwise negative except as noted above.    Physical Exam    VS:  There were no vitals taken for this visit. , BMI There is no height or weight on file to calculate BMI.     GEN: Well nourished, well developed, in no acute distress. HEENT: normal. Neck: Supple, no JVD, carotid bruits, or masses. Cardiac: RRR, no murmurs, rubs, or gallops. No clubbing, cyanosis, edema.  Radials/DP/PT 2+ and equal bilaterally.  Respiratory:  Respirations regular and unlabored, clear to auscultation bilaterally. GI: Soft, nontender, nondistended, BS + x 4. MS: no deformity or atrophy. Skin: warm and dry, no rash. Neuro:  Strength and sensation are intact. Psych: Normal affect.  Accessory Clinical Findings    ECG personally reviewed by me today -    - no acute changes.   Lab Results  Component Value Date   WBC 7.4 12/01/2022   HGB 12.2 (L) 12/01/2022   HCT 36.3 (L) 12/01/2022   MCV 98.9 12/01/2022   PLT 203 12/01/2022   Lab Results  Component Value Date   CREATININE 0.98 01/15/2023   BUN 14 01/15/2023   NA 141 01/15/2023   K 3.7 01/15/2023   CL 103 01/15/2023   CO2 28 01/15/2023   Lab Results  Component Value Date   ALT 14 12/01/2022   AST 24 12/01/2022   ALKPHOS 57 12/01/2022   BILITOT 1.2 12/01/2022   Lab Results  Component Value Date   CHOL 208 (H) 10/26/2022   HDL 50.50 10/26/2022   LDLCALC 128 (H) 10/26/2022   TRIG 145.0 10/26/2022   CHOLHDL 4 10/26/2022    Lab Results  Component Value Date   HGBA1C 5.4 10/25/2021    Assessment & Plan    1.  ***  No BP recorded.  {Refresh Note OR Click here to enter BP  :1}***   Joylene Grapes, NP 03/12/2023, 6:02 AM

## 2023-03-15 ENCOUNTER — Other Ambulatory Visit: Payer: Self-pay | Admitting: Internal Medicine

## 2023-03-15 DIAGNOSIS — M159 Polyosteoarthritis, unspecified: Secondary | ICD-10-CM

## 2023-03-23 ENCOUNTER — Ambulatory Visit: Payer: Medicare Other

## 2023-03-23 DIAGNOSIS — Z7901 Long term (current) use of anticoagulants: Secondary | ICD-10-CM | POA: Diagnosis not present

## 2023-03-23 LAB — POCT INR: INR: 2 (ref 2.0–3.0)

## 2023-03-23 NOTE — Progress Notes (Signed)
Pt also taking prednisone 10 mg QD.  Continue 1/2 tablet daily. Recheck in 5 weeks.

## 2023-03-23 NOTE — Patient Instructions (Addendum)
Pre visit review using our clinic review tool, if applicable. No additional management support is needed unless otherwise documented below in the visit note.  Continue 1/2 tablet daily. Recheck in 5 weeks.

## 2023-03-28 ENCOUNTER — Encounter (INDEPENDENT_AMBULATORY_CARE_PROVIDER_SITE_OTHER): Payer: Self-pay

## 2023-03-29 ENCOUNTER — Other Ambulatory Visit: Payer: Self-pay | Admitting: Internal Medicine

## 2023-03-29 DIAGNOSIS — G4700 Insomnia, unspecified: Secondary | ICD-10-CM

## 2023-03-29 NOTE — Progress Notes (Deleted)
Cardiology Office Note:  .   Date:  03/29/2023  ID:  [name], DOB 1935/12/01, MRN [id number] PCP: Philip Aspen, Limmie Patricia, MD  [facility] Providers Cardiologist:  Olga Millers, MD   History of Present Illness: .   [name] is a 87 y.o. male with a history of permanent atrial fibrillation, aortic stenosis s/p aortic valve replacement, aortic aneurysm repair in 2006, chronic diastolic heart failure, bradycardia, hypertension, and lung cancer who presents for follow-up related to aortic stenosis, heart failure, and atrial fibrillation  Per chart review, patient had aortic stenosis and underwent aortic valve replacement with a pericardial tissue valve and thoracic aortic aneurysm repair in 2006. Preoperative cardiac catheterization showed normal coronary arteries. He has permanent atrial fibrillation. In 04/2017, patient was scheduled to have a laminectomy, but was found to have a spontaneous epidural hematoma. The hematoma was evacuated, and anticoagulation was eventually resumed. Patient wore a cardiac monitor in 11/2017 that showed atrial fibrillation with PVCs, multiple pauses with the longest lasting 4 seconds. All pauses occurred late PM or early AM hours. Patient was referred to EP for consideration of pacemaker, but patient declined. Echocardiogram in 12/2020 showed EF 70-75%, no regional wall motion abnormalities, normal RV function, mild mitral valve regurgitation. The aortic valve had been replaced, and there was mild aortic regurgitation and no aortic stenosis.   Patient was last seen by cardiology on 06/07/22. At that time, patient complained of increased dyspnea on exertion for several weeks. His lasix was increased. He remained on amlodipine, coumadin. His HR was well controlled without use of rate controlling medications. His most recent echocardiogram from 01/19/23 showed EF 70-75%, no regional wall motion abnormalities, severe LVH, normal RV function, mild mitral  valve regurgitation. There was a bioprosthetic valve present in the aortic position with mild regurgitation, no stenosis.    Permanent atrial fibrillation  - HR is well controlled without rate controlling medications  - On coumadin with goal INR 2-3. Followed by his IM practice   AS s/p Aortic Valve Replacement  - Underwent replacement in 2006. most recent echocardiogram from 12/2022 showed mild regurgitation, no stenosis - Continue SBE prophylaxis   HTN   Chronic Diastolic Heart Failure  Mild mitral regurgitation  - Echocardiogram from 12/2022 showed EF 70-75% with severe LVH, normal RV function, mild mitral regurgitation  - Euvolemic on exam *** - Continue lasix *** and potassium 20 meq daily  - Check BMP   Bradycardia  - Cardiac monitor in 11/2017 showed atrial fibrillation with multiple pauses. Longest pause lasted 4 seconds. Pauses occurred late PM and early AM  - Patient was offered EP evaluation to discuss PPM, but patient declined  - Denies recent syncope or near syncope ***  HLD - Lipid panel from 09/2022 showed LDL 128, HDL 50, triglycerides 145, total cholesterol 208 -   ROS: ***  Studies Reviewed: .        *** Risk Assessment/Calculations:   {Does this patient have ATRIAL FIBRILLATION?:[id number]} No BP recorded.  {Refresh Note OR Click here to enter BP  :1}***       Physical Exam:   VS:  There were no vitals taken for this visit.   Wt Readings from Last 3 Encounters:  01/24/23 164 lb (74.4 kg)  01/15/23 167 lb (75.8 kg)  12/01/22 162 lb (73.5 kg)    GEN: Well nourished, well developed in no acute distress NECK: No JVD; No carotid bruits CARDIAC: ***RRR, no murmurs, rubs, gallops RESPIRATORY:  Clear  to auscultation without rales, wheezing or rhonchi  ABDOMEN: Soft, non-tender, non-distended EXTREMITIES:  No edema; No deformity   ASSESSMENT AND PLAN: .   ***    {Are you ordering a CV Procedure (e.g. stress test, cath, DCCV, TEE, etc)?   Press F2         :[id number]}  Dispo: ***  Signed, Jonita Albee, PA-C

## 2023-04-02 ENCOUNTER — Other Ambulatory Visit: Payer: Self-pay | Admitting: Internal Medicine

## 2023-04-02 DIAGNOSIS — M159 Polyosteoarthritis, unspecified: Secondary | ICD-10-CM

## 2023-04-04 ENCOUNTER — Ambulatory Visit: Payer: Medicare Other | Admitting: Cardiology

## 2023-04-23 ENCOUNTER — Telehealth: Payer: Self-pay | Admitting: Pulmonary Disease

## 2023-04-23 ENCOUNTER — Other Ambulatory Visit: Payer: Self-pay | Admitting: Internal Medicine

## 2023-04-23 NOTE — Telephone Encounter (Signed)
Pt. Calling was a pt. Of Dr. Thora Lance and needs more refills on furosemide (LASIX) 40 MG tablet  to be sent to pharmacy [facility] [address] please advise

## 2023-04-27 ENCOUNTER — Ambulatory Visit: Payer: Medicare Other

## 2023-04-27 DIAGNOSIS — Z7901 Long term (current) use of anticoagulants: Secondary | ICD-10-CM

## 2023-04-27 LAB — POCT INR: INR: 1.9 — AB (ref 2.0–3.0)

## 2023-04-27 NOTE — Telephone Encounter (Signed)
Pt contactewd coumadin clinic reporting he received a msg that the coumadin clinic tried to contact him this morning before his apt today.   Advised the coumadin clinic did not try to contact him but the pulmonary clinic did concerning refill of furosemide. They advised he needs to get pharmacy to request the refill from Dr. Jens Som.   Pt verbalized understanding and will talk to the pharmacy.

## 2023-04-27 NOTE — Patient Instructions (Addendum)
Pre visit review using our clinic review tool, if applicable. No additional management support is needed unless otherwise documented below in the visit note.  Continue 1/2 tablet daily. Recheck in 6 weeks. 

## 2023-04-27 NOTE — Progress Notes (Addendum)
Pt also taking prednisone 10 mg QD.  Pt has not taken his warfarin dose this morning and he takes all of his doses in the morning. Continue 1/2 tablet daily. Recheck in 6 weeks.

## 2023-04-27 NOTE — Telephone Encounter (Signed)
Attempted to reach patient no answer.  He will need to have the pharmacy reach out to the previous prescriber Dr Jens Som.

## 2023-05-01 ENCOUNTER — Ambulatory Visit (HOSPITAL_BASED_OUTPATIENT_CLINIC_OR_DEPARTMENT_OTHER)
Admission: RE | Admit: 2023-05-01 | Discharge: 2023-05-01 | Disposition: A | Discharge status: Home / Self Care | Payer: Medicare Other | Source: Ambulatory Visit | Attending: Pulmonary Disease | Admitting: Pulmonary Disease

## 2023-05-01 DIAGNOSIS — C3490 Malignant neoplasm of unspecified part of unspecified bronchus or lung: Secondary | ICD-10-CM | POA: Diagnosis not present

## 2023-05-01 DIAGNOSIS — J90 Pleural effusion, not elsewhere classified: Secondary | ICD-10-CM | POA: Diagnosis not present

## 2023-05-01 DIAGNOSIS — J679 Hypersensitivity pneumonitis due to unspecified organic dust: Secondary | ICD-10-CM | POA: Insufficient documentation

## 2023-05-04 ENCOUNTER — Ambulatory Visit (HOSPITAL_BASED_OUTPATIENT_CLINIC_OR_DEPARTMENT_OTHER): Payer: Medicare Other

## 2023-05-09 NOTE — Progress Notes (Deleted)
Cardiology Office Note:    Date:  05/09/2023   ID:  [name], DOB 20-Jul-1936, MRN [id number]  PCP:  Philip Aspen, Limmie Patricia, MD   [facility] Providers Cardiologist:  Olga Millers, MD { Click to update primary MD,subspecialty MD or APP then REFRESH:1}    Referring MD: Philip Aspen, Estel*   No chief complaint on file. ***  History of Present Illness:    [name] is a 87 y.o. male with a hx of aortic stenosis s/p aortic valve replacement with pericardial tissue valve and thoracic aortic aneurysm repair 02/2005. He has diastolic heart failure.  Preop LHC showed normal coronaries.  He does have permanent atrial fibrillation and is anticoagulated with coumadin.   He was admitted 04/2017 with back pain for planned laminectomy but found to have spontaneous epidural hematoma that was evacuated.  Neurosurgery felt that anticoagulation could be carried with monitoring. Heart monitor in April p.m. showed atrial fibrillation with multiple pauses greater than 3 seconds all occurring in the late evening or early a.m. hours.  Patient declined EP referral for pacemaker consideration.  He is on no beta-blocker therapy.  Hypertension controlled with amlodipine..  Recent echocardiogram 12/2022 showed an LVEF 70-75%, with AVR and mild AI, moderate biatrial enlargement, mild MR. He was last seen in clinic by Dr. Jens Som 05/2022 and was doing well at that time.  He presents today for routine cardiology follow-up.    Permanent atrial fibrillation Conduction disease Pauses noted on heart monitor No beta-blocker therapy   Chronic Coumadin treatment INR goal 2-3   Aortic stenosis s/p AVR Reassuring recent echocardiogram 12/2022 Will need SBE prophylaxis   Hypertension BP controlled, no medication changes   Mild MR Will follow clinically   Chronic diastolic heart failure with hyperdynamic LV Is maintained on 40 mg Lasix daily Consider checking BMP for renal  function and potassium           Past Medical History:  Diagnosis Date   Aortic stenosis    Atrial fibrillation (HCC)    Baker cyst    Left knee   DJD (degenerative joint disease)    knees   Dysrhythmia    Eczema    Erectile dysfunction    Gout    History of hiatal hernia    History of radiation therapy    Right lung- 04/21/21-05/03/21 Dr. Antony Blackbird   HTN (hypertension)    MVP (mitral valve prolapse)    Shortness of breath dyspnea    with activity    Past Surgical History:  Procedure Laterality Date    polyps vocal cord  1994   AORTIC VALVE REPLACEMENT  ~2004   BRONCHIAL BIOPSY  03/07/2021   Procedure: BRONCHIAL BIOPSIES;  Surgeon: Leslye Peer, MD;  Location: [facility] ENDOSCOPY;  Service: Pulmonary;;   BRONCHIAL BIOPSY  11/14/2021   Procedure: BRONCHIAL BIOPSIES;  Surgeon: Leslye Peer, MD;  Location: [facility] ENDOSCOPY;  Service: Pulmonary;;   BRONCHIAL BRUSHINGS  03/07/2021   Procedure: BRONCHIAL BRUSHINGS;  Surgeon: Leslye Peer, MD;  Location: [facility] ENDOSCOPY;  Service: Pulmonary;;   BRONCHIAL BRUSHINGS  11/14/2021   Procedure: BRONCHIAL BRUSHINGS;  Surgeon: Leslye Peer, MD;  Location: [facility] ENDOSCOPY;  Service: Pulmonary;;   BRONCHIAL NEEDLE ASPIRATION BIOPSY  03/07/2021   Procedure: BRONCHIAL NEEDLE ASPIRATION BIOPSIES;  Surgeon: Leslye Peer, MD;  Location: [facility] ENDOSCOPY;  Service: Pulmonary;;   BRONCHIAL NEEDLE ASPIRATION BIOPSY  11/14/2021   Procedure: BRONCHIAL NEEDLE ASPIRATION BIOPSIES;  Surgeon: Leslye Peer,  MD;  Location: [facility] ENDOSCOPY;  Service: Pulmonary;;   BRONCHIAL WASHINGS  09/21/2020   Procedure: BRONCHIAL WASHINGS;  Surgeon: Martina Sinner, MD;  Location: [facility] ENDOSCOPY;  Service: Pulmonary;;  BAL    BRONCHIAL WASHINGS  03/07/2021   Procedure: BRONCHIAL WASHINGS;  Surgeon: Leslye Peer, MD;  Location: [facility] ENDOSCOPY;  Service: Pulmonary;;   BRONCHIAL WASHINGS  11/14/2021   Procedure: BRONCHIAL WASHINGS;  Surgeon: Leslye Peer, MD;   Location: [facility] ENDOSCOPY;  Service: Pulmonary;;   FIDUCIAL MARKER PLACEMENT  03/07/2021   Procedure: FIDUCIAL MARKER PLACEMENT;  Surgeon: Leslye Peer, MD;  Location: [facility] ENDOSCOPY;  Service: Pulmonary;;   HERNIA REPAIR  [id number]   HERNIA REPAIR  ~1983   IR ANGIOGRAM PELVIS SELECTIVE OR SUPRASELECTIVE  12/01/2022   IR ANGIOGRAM PELVIS SELECTIVE OR SUPRASELECTIVE  12/01/2022   IR ANGIOGRAM SELECTIVE EACH ADDITIONAL VESSEL  12/01/2022   IR ANGIOGRAM SELECTIVE EACH ADDITIONAL VESSEL  12/01/2022   IR CT SPINE LTD  12/01/2022   IR CT SPINE LTD  12/01/2022   IR CT SPINE LTD  12/01/2022   IR EMBO ARTERIAL NOT HEMORR HEMANG INC GUIDE ROADMAPPING  12/01/2022   IR EMBO TUMOR ORGAN ISCHEMIA INFARCT INC GUIDE ROADMAPPING  12/01/2022   IR RADIOLOGIST EVAL & MGMT  11/17/2022   IR US GUIDE VASC ACCESS LEFT  12/01/2022   IR US GUIDE VASC ACCESS RIGHT  12/01/2022   LUMBAR LAMINECTOMY/DECOMPRESSION MICRODISCECTOMY N/A 01/15/2015   Procedure: 2 LEVEL DECOMPRESSIVE LUMBAR LAMINECTOMY L3-L4,L4-L5;  Surgeon: Ranee Gosselin, MD;  Location: [facility] ORS;  Service: Orthopedics;  Laterality: N/A;   LUMBAR LAMINECTOMY/DECOMPRESSION MICRODISCECTOMY N/A 05/01/2017   Procedure: Lumbar one-Lumbar two, Lumbar two-Lumbar three, Lumbar three-Lumbar four Laminectomy; Evacuation of hematoma;  Surgeon: Lisbeth Renshaw, MD;  Location: [facility] OR;  Service: Neurosurgery;  Laterality: N/A;   right total hip  ~4 years ago   ROTATOR CUFF REPAIR Left ~2009   TOTAL KNEE ARTHROPLASTY Left 04/14/2015   Procedure: LEFT TOTAL KNEE ARTHROPLASTY;  Surgeon: Ranee Gosselin, MD;  Location: [facility] ORS;  Service: Orthopedics;  Laterality: Left;   VIDEO BRONCHOSCOPY N/A 09/21/2020   Procedure: VIDEO BRONCHOSCOPY WITHOUT FLUORO;  Surgeon: Martina Sinner, MD;  Location: [facility] ENDOSCOPY;  Service: Pulmonary;  Laterality: N/A;   VIDEO BRONCHOSCOPY WITH ENDOBRONCHIAL NAVIGATION Right 03/07/2021   Procedure: VIDEO BRONCHOSCOPY WITH ENDOBRONCHIAL NAVIGATION;  Surgeon: Leslye Peer, MD;   Location: [facility] ENDOSCOPY;  Service: Pulmonary;  Laterality: Right;   VIDEO BRONCHOSCOPY WITH RADIAL ENDOBRONCHIAL ULTRASOUND  11/14/2021   Procedure: VIDEO BRONCHOSCOPY WITH RADIAL ENDOBRONCHIAL ULTRASOUND;  Surgeon: Leslye Peer, MD;  Location: [facility] ENDOSCOPY;  Service: Pulmonary;;    Current Medications: No outpatient medications have been marked as taking for the 05/23/23 encounter (Appointment) with Marcelino Duster, PA.     Allergies:   Patient has no known allergies.   Social History   Socioeconomic History   Marital status: Divorced    Spouse name: Not on file   Number of children: Not on file   Years of education: Not on file   Highest education level: Not on file  Occupational History   Not on file  Tobacco Use   Smoking status: Former    Current packs/day: 0.00    Types: Cigarettes    Start date: 08/28/1972    Quit date: 08/28/2002    Years since quitting: 20.7   Smokeless tobacco: Never  Vaping Use   Vaping status: Never Used  Substance and Sexual Activity   Alcohol use: Yes  Alcohol/week: 3.0 standard drinks of alcohol    Types: 3 Shots of liquor per week    Comment: social   Drug use: No   Sexual activity: Not on file  Other Topics Concern   Not on file  Social History Narrative   Not on file   Social Determinants of Health   Financial Resource Strain: Low Risk  (10/26/2022)   Overall Financial Resource Strain (CARDIA)    Difficulty of Paying Living Expenses: Not hard at all  Food Insecurity: No Food Insecurity (10/26/2022)   Hunger Vital Sign    Worried About Running Out of Food in the Last Year: Never true    Ran Out of Food in the Last Year: Never true  Transportation Needs: No Transportation Needs (10/26/2022)   PRAPARE - Administrator, Civil Service (Medical): No    Lack of Transportation (Non-Medical): No  Physical Activity: Insufficiently Active (10/26/2022)   Exercise Vital Sign    Days of Exercise per Week: 3 days    Minutes of  Exercise per Session: 30 min  Stress: No Stress Concern Present (10/26/2022)   [employer] of Occupational Health - Occupational Stress Questionnaire    Feeling of Stress : Not at all  Social Connections: Moderately Isolated (10/26/2022)   Social Connection and Isolation Panel [NHANES]    Frequency of Communication with Friends and Family: More than three times a week    Frequency of Social Gatherings with Friends and Family: Once a week    Attends Religious Services: More than 4 times per year    Active Member of [employer] or Organizations: No    Attends Banker Meetings: Never    Marital Status: Divorced     Family History: The patient's ***family history includes Diabetes in an other family member; Stroke in an other family member.  ROS:   Please see the history of present illness.    *** All other systems reviewed and are negative.  EKGs/Labs/Other Studies Reviewed:    The following studies were reviewed today:  Echo 12/2022 . S/P AVR with mean gradient 16 mmHg and mild AI.   2. Left ventricular ejection fraction, by estimation, is 70 to 75%. The  left ventricle has hyperdynamic function. The left ventricle has no  regional wall motion abnormalities. There is severe left ventricular  hypertrophy. Left ventricular diastolic  parameters are indeterminate.   3. Right ventricular systolic function is normal. The right ventricular  size is normal. There is mildly elevated pulmonary artery systolic  pressure.   4. Left atrial size was severely dilated.   5. Right atrial size was severely dilated.   6. The mitral valve is normal in structure. Mild mitral valve  regurgitation. No evidence of mitral stenosis.   7. The aortic valve has been repaired/replaced. Aortic valve  regurgitation is mild. No aortic stenosis is present. There is a  bioprosthetic valve present in the aortic position. Procedure Date:  02/2005.   8. The inferior vena cava is normal in size with  greater than 50%  respiratory variability, suggesting right atrial pressure of 3 mmHg.           Recent Labs: 10/26/2022: TSH 1.18 12/01/2022: ALT 14; Hemoglobin 12.2; Platelets 203 01/15/2023: BUN 14; Creatinine, Ser 0.98; Potassium 3.7; Pro B Natriuretic peptide (BNP) 521.0; Sodium 141  Recent Lipid Panel    Component Value Date/Time   CHOL 208 (H) 10/26/2022 1051   TRIG 145.0 10/26/2022 1051   HDL 50.50 10/26/2022  1051   CHOLHDL 4 10/26/2022 1051   VLDL 29.0 10/26/2022 1051   LDLCALC 128 (H) 10/26/2022 1051     Risk Assessment/Calculations:   {Does this patient have ATRIAL FIBRILLATION?:[id number]}  No BP recorded.  {Refresh Note OR Click here to enter BP  :1}***         Physical Exam:    VS:  There were no vitals taken for this visit.    Wt Readings from Last 3 Encounters:  01/24/23 164 lb (74.4 kg)  01/15/23 167 lb (75.8 kg)  12/01/22 162 lb (73.5 kg)     GEN: *** Well nourished, well developed in no acute distress HEENT: Normal NECK: No JVD; No carotid bruits LYMPHATICS: No lymphadenopathy CARDIAC: ***RRR, no murmurs, rubs, gallops RESPIRATORY:  Clear to auscultation without rales, wheezing or rhonchi  ABDOMEN: Soft, non-tender, non-distended MUSCULOSKELETAL:  No edema; No deformity  SKIN: Warm and dry NEUROLOGIC:  Alert and oriented x 3 PSYCHIATRIC:  Normal affect   ASSESSMENT:    No diagnosis found. PLAN:    In order of problems listed above:  ***      {Are you ordering a CV Procedure (e.g. stress test, cath, DCCV, TEE, etc)?   Press F2        :[id number]}    Medication Adjustments/Labs and Tests Ordered: Current medicines are reviewed at length with the patient today.  Concerns regarding medicines are outlined above.  No orders of the defined types were placed in this encounter.  No orders of the defined types were placed in this encounter.   There are no Patient Instructions on file for this visit.   Signed, Marcelino Duster, [place]   05/09/2023 12:58 PM    [facility]

## 2023-05-12 ENCOUNTER — Other Ambulatory Visit: Payer: Self-pay | Admitting: Internal Medicine

## 2023-05-12 DIAGNOSIS — M159 Polyosteoarthritis, unspecified: Secondary | ICD-10-CM

## 2023-05-23 ENCOUNTER — Ambulatory Visit: Payer: Medicare Other | Admitting: Physician Assistant

## 2023-06-04 ENCOUNTER — Encounter: Payer: Self-pay | Admitting: Adult Health

## 2023-06-04 ENCOUNTER — Ambulatory Visit: Payer: Medicare Other | Admitting: Adult Health

## 2023-06-04 VITALS — BP 144/68 | HR 73 | Temp 98.1°F | Ht 66.0 in | Wt 164.0 lb

## 2023-06-04 DIAGNOSIS — I5032 Chronic diastolic (congestive) heart failure: Secondary | ICD-10-CM

## 2023-06-04 DIAGNOSIS — J432 Centrilobular emphysema: Secondary | ICD-10-CM | POA: Insufficient documentation

## 2023-06-04 DIAGNOSIS — J679 Hypersensitivity pneumonitis due to unspecified organic dust: Secondary | ICD-10-CM

## 2023-06-04 DIAGNOSIS — C3491 Malignant neoplasm of unspecified part of right bronchus or lung: Secondary | ICD-10-CM | POA: Diagnosis not present

## 2023-06-04 NOTE — Assessment & Plan Note (Signed)
Appears euvolemic on exam.  Continue on current diuretics and follow-up with cardiology

## 2023-06-04 NOTE — Assessment & Plan Note (Addendum)
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  Unclear etiology and patient has no known exposure at home or occupational .  Only previous exposure was to chickens.  -Patient was treated with steroid challenge in fall 2023.  Had significant improvement and resolution of hemoptysis.  Patient is on chronic steroids for rheumatoid arthritis currently on 10 mg of prednisone. CT chest shows stable changes.

## 2023-06-04 NOTE — Assessment & Plan Note (Signed)
Emphysema noted on CT chest.  Patient has a history of smoking.  No perceived benefit with albuterol in the past.  For now we will check PFTs on return visit.

## 2023-06-04 NOTE — Patient Instructions (Addendum)
Hold set up for CT chest without contrast October 2025  Mucinex DM Liquid Twice daily  As needed  cough/congestion.  Continue on Prednisone 10mg  daily .  Albuterol inhaler As needed   Continue on Lasix daily  Keep follow up with Cardiology as planned  Follow up with Dr. Francine Graven in 4 months with PFT and As needed   Please contact office for sooner follow up if symptoms do not improve or worsen or seek emergency care

## 2023-06-04 NOTE — Progress Notes (Signed)
@Patient  ID: [name], male    DOB: 1936/06/03, 87 y.o.   MRN: [id number]  Chief Complaint  Patient presents with   Follow-up    Referring provider: Philip Aspen, Almira Bar*  HPI: 87 year old male former smoker followed for history of non-small cell lung cancer status post SBRT September 2022, emphysema on CT chest, hypersensitivity pneumonitis Medical history significant for aortic valve replacement, atrial fibrillation on Coumadin therapy, rheumatoid arthritis on chronic steroids  Hx of raising chickens in past -limited contact in last few year   TEST/EVENTS :  underwent navigational bronchoscopy 03/07/21 for the RUL lesion and RLL lesion with the RUL lesion positive for non-small cancer and no malignancy noted of the RLL lesion, only inflammation with benign reparative changes noted. He completed SBRT to the RUL nodule 04/2021.    CT chest October 07, 2021 evolving postradiation fibrosis in the right upper lobe mass, worsening patchy groundglass attenuation and septal thickening throughout the lungs bilaterally-increased from previous exams persistent masslike area in the right lower lobe, emphysema   Navigational bronchoscopy November 14, 2021 cytology negative for malignant cells, cultures negative (RLL, LUL )   CT chest 06/2022  Evolving post treatment changes about the RIGHT apical lesion with contracting post radiation fibrosis. Lasandra Beech and waning RIGHT basilar consolidation now with new LEFT basilar consolidation. Findings could be attributed to basilar pneumonia or sequela of recurrent aspiration.. Background of worsening ground-glass and septal thickening throughout the LEFT greater than RIGHT chest.. Background pulmonary emphysema as before. Small LEFT pleural effusion is slightly enlarged compared to previous imaging. New RIGHT-sided pleural effusion.  05/2022 Hypersensitivity pneumonitis panel + A. Pullulans Abs   06/04/2023 Follow up : Emphysema, Hypersensitivity  Pneumonitis , Hx of Lung cancer . Pleural Effusions Patient returns for a 26-month follow-up.  Patient says overall his breathing has been doing about the same as last time.  He gets short of breath with heavy activity.  Denies any increased cough or congestion.  No increased leg swelling.  Patient is a former smoker has some mild emphysematous changes on CT chest .  Says he has tried albuterol inhaler in the past without any perceived benefit.  Is currently not on any maintenance inhaler.  Has not had formal PFTs He has a history of bilateral pleural effusions that have been diuretic responsive.  He remains on Lasix 40 mg.  He is followed by cardiology as he has a history of A-fib and aortic valve replacement.  2D echo in May 2024 showed EF at 70 to 75%, severe left ventricular hypertrophy, mildly elevated pulmonary artery systolic pressure.  Severely dilated left and right atrium.  Aortic valve replacement noted, aortic valve regurgitation is mild.  No stenosis. Patient has a history of hypersensitivity pneumonitis was treated with steroid challenge fall 2023.  Patient had had significant improvement on steroids with resolution of hemoptysis and decreased interstitial opacities.  He also has a history of lung cancer status post SBRT to a right upper lobe nodule in September 2022. Serial CT May 01, 2023 showed small stable bilateral pleural effusions.  Stable emphysematous changes.  Stable bandlike opacity in the right upper lobe.  Decreased in size.  No areas of consolidation.  No suspicious nodules.  We reviewed his CT chest results in detail. Patient would like to get a flu shot today.   No Known Allergies  Immunization History  Administered Date(s) Administered   Fluad Quad(high Dose 65+) 05/23/2019, 04/22/2021, 05/12/2022, 06/02/2023   Influenza, High Dose  Seasonal PF 08/17/2015, 05/31/2016   Influenza-Unspecified 06/05/2018, 05/23/2019, 07/06/2020, 04/28/2021   Moderna Sars-Covid-2  Vaccination 10/25/2019, 11/22/2019, 08/19/2020   PFIZER Comirnaty(Gray Top)Covid-19 Tri-Sucrose Vaccine 05/23/2022   Pneumococcal Conjugate-13 08/17/2015   Pneumococcal Polysaccharide-23 08/28/2004   Td 08/29/2003   Tetanus 06/10/2014    Past Medical History:  Diagnosis Date   Aortic stenosis    Atrial fibrillation (HCC)    Baker cyst    Left knee   DJD (degenerative joint disease)    knees   Dysrhythmia    Eczema    Erectile dysfunction    Gout    History of hiatal hernia    History of radiation therapy    Right lung- 04/21/21-05/03/21 Dr. Antony Blackbird   HTN (hypertension)    MVP (mitral valve prolapse)    Shortness of breath dyspnea    with activity    Tobacco History: Social History   Tobacco Use  Smoking Status Former   Current packs/day: 0.00   Types: Cigarettes   Start date: 08/28/1972   Quit date: 08/28/2002   Years since quitting: 20.7  Smokeless Tobacco Never   Counseling given: Not Answered   Outpatient Medications Prior to Visit  Medication Sig Dispense Refill   allopurinol (ZYLOPRIM) 300 MG tablet Take 1 tablet by mouth once daily 90 tablet 1   amLODipine (NORVASC) 5 MG tablet Take 1 tablet by mouth once daily 90 tablet 1   furosemide (LASIX) 40 MG tablet Take 1 tablet by mouth once daily 90 tablet 3   potassium chloride SA (KLOR-CON M) 20 MEQ tablet Take 1 tablet by mouth once daily 90 tablet 2   predniSONE (DELTASONE) 20 MG tablet Take 1 tablet by mouth once daily with breakfast 90 tablet 0   tamsulosin (FLOMAX) 0.4 MG CAPS capsule TAKE 1 CAPSULE BY MOUTH ONCE DAILY AFTER BREAKFAST 90 capsule 0   traMADol (ULTRAM) 50 MG tablet TAKE 1 TABLET BY MOUTH EVERY 12 HOURS AS NEEDED 60 tablet 0   traZODone (DESYREL) 50 MG tablet TAKE 1 TABLET BY MOUTH AT BEDTIME AS NEEDED 90 tablet 0   triamcinolone cream (KENALOG) 0.1 % APPLY  CREAM EXTERNALLY TO AFFECTED AREA TWICE DAILY 80 g 0   VITAMIN D PO Take 1 tablet by mouth daily.     warfarin (COUMADIN) 5 MG tablet  TAKE 1/2 TABLET BY MOUTH  DAILY OR AS  DIRECTED  BY  ANTICOAGULATION  CLINIC 50 tablet 1   No facility-administered medications prior to visit.     Review of Systems:   Constitutional:   No  weight loss, night sweats,  Fevers, chills, + fatigue, or  lassitude.  HEENT:   No headaches,  Difficulty swallowing,  Tooth/dental problems, or  Sore throat,                No sneezing, itching, ear ache, nasal congestion, post nasal drip,   CV:  No chest pain,  Orthopnea, PND, swelling in lower extremities, anasarca, dizziness, palpitations, syncope.   GI  No heartburn, indigestion, abdominal pain, nausea, vomiting, diarrhea, change in bowel habits, loss of appetite, bloody stools.   Resp: No excess mucus, no productive cough,  No non-productive cough,  No coughing up of blood.  No change in color of mucus.  No wheezing.  No chest wall deformity  Skin: no rash or lesions.  GU: no dysuria, change in color of urine, no urgency or frequency.  No flank pain, no hematuria   MS:  No joint pain or swelling.  No decreased range of motion.  No back pain.    Physical Exam  BP (!) 144/68 (BP Location: Left Arm, Patient Position: Sitting, Cuff Size: Normal)   Pulse 73   Temp 98.1 F (36.7 C) (Oral)   Ht 5\' 6"  (1.676 m)   Wt 164 lb (74.4 kg)   SpO2 98%   BMI 26.47 kg/m   GEN: A/Ox3; pleasant , NAD, well nourished    HEENT:  [place]/AT,   NOSE-clear, THROAT-clear, no lesions, no postnasal drip or exudate noted.   NECK:  Supple w/ fair ROM; no JVD; normal carotid impulses w/o bruits; no thyromegaly or nodules palpated; no lymphadenopathy.    RESP  Clear  P & A; w/o, wheezes/ rales/ or rhonchi. no accessory muscle use, no dullness to percussion  CARD:  RRR, no m/r/g, no peripheral edema, pulses intact, no cyanosis or clubbing.  GI:   Soft & nt; nml bowel sounds; no organomegaly or masses detected.   Musco: Warm bil, no deformities or joint swelling noted.   Neuro: alert, no focal deficits  noted.    Skin: Warm, no lesions or rashes    Lab Results:  CBC   BMET  BNP   Imaging: No results found.  Administration History     None           No data to display          No results found for: "NITRICOXIDE"      Assessment & Plan:   Non-small cell lung cancer (HCC) History of non-small cell lung cancer diagnosed in 2022 status post SBRT to the right upper lobe lung nodule.  Serial CT chest that showed stable involving postradiation fibrosis of the right upper lobe.  Repeat navigational bronchoscopy March 2023 with negative cytology and cultures.  Repeat CT chest September 2024 shows stable changes without new suspicious nodules.  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  Unclear etiology and patient has no known exposure at home or occupational .  Only previous exposure was to chickens.  -Patient was treated with steroid challenge in fall 2023.  Had significant improvement and resolution of hemoptysis.  Patient is on chronic steroids for rheumatoid arthritis currently on 10 mg of prednisone. CT chest shows stable changes.    Chronic diastolic CHF (congestive heart failure) (HCC) Appears euvolemic on exam.  Continue on current diuretics and follow-up with cardiology  Centrilobular emphysema (HCC) Emphysema noted on CT chest.  Patient has a history of smoking.  No perceived benefit with albuterol in the past.  For now we will check PFTs on return visit.      Rubye Oaks, NP 06/04/2023

## 2023-06-04 NOTE — Assessment & Plan Note (Signed)
History of non-small cell lung cancer diagnosed in 2022 status post SBRT to the right upper lobe lung nodule.  Serial CT chest that showed stable involving postradiation fibrosis of the right upper lobe.  Repeat navigational bronchoscopy March 2023 with negative cytology and cultures.  Repeat CT chest September 2024 shows stable changes without new suspicious nodules.  Will repeat CT chest in 1 year

## 2023-06-08 ENCOUNTER — Ambulatory Visit: Payer: Medicare Other

## 2023-06-08 DIAGNOSIS — Z7901 Long term (current) use of anticoagulants: Secondary | ICD-10-CM | POA: Diagnosis not present

## 2023-06-08 LAB — POCT INR: INR: 1.4 — AB (ref 2.0–3.0)

## 2023-06-08 NOTE — Progress Notes (Signed)
Pt also taking prednisone 10 mg QD.  Pt denies any other changes. Pt has not taken his warfarin dose this morning and he takes all of his doses in the morning. Increase dose today to take 1 tablet and then continue 1/2 tablet daily. Recheck in 4 weeks per pt request.

## 2023-06-08 NOTE — Patient Instructions (Addendum)
Pre visit review using our clinic review tool, if applicable. No additional management support is needed unless otherwise documented below in the visit note.  Increase dose today to take 1 tablet and then continue 1/2 tablet daily. Recheck in 4 weeks.

## 2023-06-11 ENCOUNTER — Other Ambulatory Visit: Payer: Self-pay | Admitting: Internal Medicine

## 2023-06-11 DIAGNOSIS — M150 Primary generalized (osteo)arthritis: Secondary | ICD-10-CM

## 2023-06-11 NOTE — Progress Notes (Signed)
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 Preoperative cardiac catheterization revealed normal coronary arteries. He has permanent atrial fibrillation. Previous carotid Dopplers due to bruits revealed 0-39% bilateral stenosis. Admitted 9/18 with back pain for planned laminectomy. However he was found to have spontaneous epidural hematoma which was evacuated. Discussed with Dr Conchita Paris and he felt anticoagulation could be carefully resumed. Anticoagulation resumed at previous ov. Monitor April 2019 showed atrial fibrillation with multiple pauses greater than 3 seconds all occurring late p.m. or early a.m. hours. We asked patient to be seen by electrophysiology following results of previous event monitor for consideration of pacemaker. Patient declined. Echocardiogram May 2024 showed normal LV function, prior aortic valve replacement with mean gradient 16 mmHg and mild aortic insufficiency, severe left ventricular hypertrophy, severe biatrial enlargement, mild mitral regurgitation.  Since I last saw him, he has some increased dyspnea on exertion predominantly in the mornings.  He denies orthopnea, PND or pedal edema.  He has not had chest pain or syncope show he states he fell recently.  However this is not typically a problem.  Current Outpatient Medications  Medication Sig Dispense Refill   allopurinol (ZYLOPRIM) 300 MG tablet Take 1 tablet by mouth once daily 90 tablet 1   amLODipine (NORVASC) 5 MG tablet Take 1 tablet by mouth once daily 90 tablet 1   furosemide (LASIX) 40 MG tablet Take 1 tablet by mouth once daily 90 tablet 3   potassium chloride SA (KLOR-CON M) 20 MEQ tablet Take 1 tablet by mouth once daily 90 tablet 2   predniSONE (DELTASONE) 20 MG tablet Take 1 tablet by mouth once daily with breakfast 90 tablet 0   tamsulosin (FLOMAX) 0.4 MG CAPS capsule TAKE 1 CAPSULE BY MOUTH ONCE DAILY  AFTER BREAKFAST 90 capsule 0   traMADol (ULTRAM) 50 MG tablet TAKE 1 TABLET BY MOUTH EVERY 12 HOURS AS NEEDED 60 tablet 0   traZODone (DESYREL) 50 MG tablet TAKE 1 TABLET BY MOUTH AT BEDTIME AS NEEDED 90 tablet 0   triamcinolone cream (KENALOG) 0.1 % APPLY  CREAM EXTERNALLY TO AFFECTED AREA TWICE DAILY 80 g 0   VITAMIN D PO Take 1 tablet by mouth daily.     warfarin (COUMADIN) 5 MG tablet TAKE 1/2 TABLET BY MOUTH  DAILY OR AS  DIRECTED  BY  ANTICOAGULATION  CLINIC 50 tablet 1   No current facility-administered medications for this visit.     Past Medical History:  Diagnosis Date   Aortic stenosis    Atrial fibrillation (HCC)    Baker cyst    Left knee   DJD (degenerative joint disease)    knees   Dysrhythmia    Eczema    Erectile dysfunction    Gout    History of hiatal hernia    History of radiation therapy    Right lung- 04/21/21-05/03/21 Dr. Antony Blackbird   HTN (hypertension)    MVP (mitral valve prolapse)    Shortness of breath dyspnea    with activity    Past Surgical History:  Procedure Laterality Date    polyps vocal cord  1994   AORTIC VALVE REPLACEMENT  ~2004   BRONCHIAL BIOPSY  03/07/2021   Procedure: BRONCHIAL BIOPSIES;  Surgeon: Leslye Peer, MD;  Location: [facility] ENDOSCOPY;  Service: Pulmonary;;   BRONCHIAL BIOPSY  11/14/2021   Procedure: BRONCHIAL BIOPSIES;  Surgeon: Leslye Peer, MD;  Location:  [facility] ENDOSCOPY;  Service: Pulmonary;;   BRONCHIAL BRUSHINGS  03/07/2021   Procedure: BRONCHIAL BRUSHINGS;  Surgeon: Leslye Peer, MD;  Location: [facility] ENDOSCOPY;  Service: Pulmonary;;   BRONCHIAL BRUSHINGS  11/14/2021   Procedure: BRONCHIAL BRUSHINGS;  Surgeon: Leslye Peer, MD;  Location: [facility] ENDOSCOPY;  Service: Pulmonary;;   BRONCHIAL NEEDLE ASPIRATION BIOPSY  03/07/2021   Procedure: BRONCHIAL NEEDLE ASPIRATION BIOPSIES;  Surgeon: Leslye Peer, MD;  Location: [facility] ENDOSCOPY;  Service: Pulmonary;;   BRONCHIAL NEEDLE ASPIRATION BIOPSY  11/14/2021   Procedure:  BRONCHIAL NEEDLE ASPIRATION BIOPSIES;  Surgeon: Leslye Peer, MD;  Location: [facility] ENDOSCOPY;  Service: Pulmonary;;   BRONCHIAL WASHINGS  09/21/2020   Procedure: BRONCHIAL WASHINGS;  Surgeon: Martina Sinner, MD;  Location: [facility] ENDOSCOPY;  Service: Pulmonary;;  BAL    BRONCHIAL WASHINGS  03/07/2021   Procedure: BRONCHIAL WASHINGS;  Surgeon: Leslye Peer, MD;  Location: [facility] ENDOSCOPY;  Service: Pulmonary;;   BRONCHIAL WASHINGS  11/14/2021   Procedure: BRONCHIAL WASHINGS;  Surgeon: Leslye Peer, MD;  Location: [facility] ENDOSCOPY;  Service: Pulmonary;;   FIDUCIAL MARKER PLACEMENT  03/07/2021   Procedure: FIDUCIAL MARKER PLACEMENT;  Surgeon: Leslye Peer, MD;  Location: [facility] ENDOSCOPY;  Service: Pulmonary;;   HERNIA REPAIR  [phone]   HERNIA REPAIR  ~1983   IR ANGIOGRAM PELVIS SELECTIVE OR SUPRASELECTIVE  12/01/2022   IR ANGIOGRAM PELVIS SELECTIVE OR SUPRASELECTIVE  12/01/2022   IR ANGIOGRAM SELECTIVE EACH ADDITIONAL VESSEL  12/01/2022   IR ANGIOGRAM SELECTIVE EACH ADDITIONAL VESSEL  12/01/2022   IR CT SPINE LTD  12/01/2022   IR CT SPINE LTD  12/01/2022   IR CT SPINE LTD  12/01/2022   IR EMBO ARTERIAL NOT HEMORR HEMANG INC GUIDE ROADMAPPING  12/01/2022   IR EMBO TUMOR ORGAN ISCHEMIA INFARCT INC GUIDE ROADMAPPING  12/01/2022   IR RADIOLOGIST EVAL & MGMT  11/17/2022   IR US GUIDE VASC ACCESS LEFT  12/01/2022   IR US GUIDE VASC ACCESS RIGHT  12/01/2022   LUMBAR LAMINECTOMY/DECOMPRESSION MICRODISCECTOMY N/A 01/15/2015   Procedure: 2 LEVEL DECOMPRESSIVE LUMBAR LAMINECTOMY L3-L4,L4-L5;  Surgeon: Ranee Gosselin, MD;  Location: [facility] ORS;  Service: Orthopedics;  Laterality: N/A;   LUMBAR LAMINECTOMY/DECOMPRESSION MICRODISCECTOMY N/A 05/01/2017   Procedure: Lumbar one-Lumbar two, Lumbar two-Lumbar three, Lumbar three-Lumbar four Laminectomy; Evacuation of hematoma;  Surgeon: Lisbeth Renshaw, MD;  Location: [facility] OR;  Service: Neurosurgery;  Laterality: N/A;   right total hip  ~4 years ago   ROTATOR CUFF REPAIR Left ~2009   TOTAL  KNEE ARTHROPLASTY Left 04/14/2015   Procedure: LEFT TOTAL KNEE ARTHROPLASTY;  Surgeon: Ranee Gosselin, MD;  Location: [facility] ORS;  Service: Orthopedics;  Laterality: Left;   VIDEO BRONCHOSCOPY N/A 09/21/2020   Procedure: VIDEO BRONCHOSCOPY WITHOUT FLUORO;  Surgeon: Martina Sinner, MD;  Location: [facility] ENDOSCOPY;  Service: Pulmonary;  Laterality: N/A;   VIDEO BRONCHOSCOPY WITH ENDOBRONCHIAL NAVIGATION Right 03/07/2021   Procedure: VIDEO BRONCHOSCOPY WITH ENDOBRONCHIAL NAVIGATION;  Surgeon: Leslye Peer, MD;  Location: [facility] ENDOSCOPY;  Service: Pulmonary;  Laterality: Right;   VIDEO BRONCHOSCOPY WITH RADIAL ENDOBRONCHIAL ULTRASOUND  11/14/2021   Procedure: VIDEO BRONCHOSCOPY WITH RADIAL ENDOBRONCHIAL ULTRASOUND;  Surgeon: Leslye Peer, MD;  Location: [facility] ENDOSCOPY;  Service: Pulmonary;;    Social History   Socioeconomic History   Marital status: Divorced    Spouse name: Not on file   Number of children: Not on file   Years of education: Not on file   Highest education level: Not on file  Occupational History  Not on file  Tobacco Use   Smoking status: Former    Current packs/day: 0.00    Types: Cigarettes    Start date: 08/28/1972    Quit date: 08/28/2002    Years since quitting: 20.8   Smokeless tobacco: Never  Vaping Use   Vaping status: Never Used  Substance and Sexual Activity   Alcohol use: Yes    Alcohol/week: 3.0 standard drinks of alcohol    Types: 3 Shots of liquor per week    Comment: social   Drug use: No   Sexual activity: Not on file  Other Topics Concern   Not on file  Social History Narrative   Not on file   Social Determinants of Health   Financial Resource Strain: Low Risk  (10/26/2022)   Overall Financial Resource Strain (CARDIA)    Difficulty of Paying Living Expenses: Not hard at all  Food Insecurity: No Food Insecurity (10/26/2022)   Hunger Vital Sign    Worried About Running Out of Food in the Last Year: Never true    Ran Out of Food in the Last Year:  Never true  Transportation Needs: No Transportation Needs (10/26/2022)   PRAPARE - Administrator, Civil Service (Medical): No    Lack of Transportation (Non-Medical): No  Physical Activity: Insufficiently Active (10/26/2022)   Exercise Vital Sign    Days of Exercise per Week: 3 days    Minutes of Exercise per Session: 30 min  Stress: No Stress Concern Present (10/26/2022)   [employer] of Occupational Health - Occupational Stress Questionnaire    Feeling of Stress : Not at all  Social Connections: Moderately Isolated (10/26/2022)   Social Connection and Isolation Panel [NHANES]    Frequency of Communication with Friends and Family: More than three times a week    Frequency of Social Gatherings with Friends and Family: Once a week    Attends Religious Services: More than 4 times per year    Active Member of [employer] or Organizations: No    Attends Banker Meetings: Never    Marital Status: Divorced  Catering manager Violence: Not At Risk (10/26/2022)   Humiliation, Afraid, Rape, and Kick questionnaire    Fear of Current or Ex-Partner: No    Emotionally Abused: No    Physically Abused: No    Sexually Abused: No    Family History  Problem Relation Age of Onset   Diabetes Other    Stroke Other     ROS: no fevers or chills, productive cough, hemoptysis, dysphasia, odynophagia, melena, hematochezia, dysuria, hematuria, rash, seizure activity, orthopnea, PND, pedal edema, claudication. Remaining systems are negative.  Physical Exam: Well-developed well-nourished in no acute distress.  Skin is warm and dry.  HEENT is normal.  Neck is supple.  Chest is clear to auscultation with normal expansion.  Cardiovascular exam is irregular Abdominal exam nontender or distended. No masses palpated. Extremities show no edema. neuro grossly intact  EKG Interpretation Date/Time:  Monday June 25 2023 10:27:22 EDT Ventricular Rate:  75 PR Interval:    QRS  Duration:  162 QT Interval:  434 QTC Calculation: 484 R Axis:   40  Text Interpretation: Atrial fibrillation Right bundle branch block When compared with ECG of 07-Mar-2021 06:58, Right bundle branch block has replaced Incomplete right bundle branch block Confirmed by Olga Millers ([id number]) on 06/25/2023 10:28:28 AM    A/P  1 permanent atrial fibrillation-patient's heart rate is controlled on no medications.  Will continue Coumadin  with goal INR 2-3.  Check hemoglobin.  2 status post aortic valve replacement-continue SBE prophylaxis.  3 chronic diastolic congestive heart failure-patient complains of some increased dyspnea on exertion in the mornings.  He does not appear to be volume overloaded on examination the recent CT showed stable small pleural effusions.  I will increase Lasix to 80 mg daily and potassium to 40 mill equivalents daily for 3 days then resume previous dose.  Check potassium, renal function and BNP.  If BNP elevated may leave him on higher dose diuretic.  Some of his dyspnea may be secondary to COPD/history of lung cancer/pneumonitis.  4 hypertension-blood pressure is controlled.  Continue present medications.  5 history of bradycardia-previously declined EP evaluation despite pauses that were noted.  Patient has not had syncope.  Will continue to follow.  6 history of lung cancer-followed by oncology.  Olga Millers, MD

## 2023-06-25 ENCOUNTER — Encounter: Payer: Self-pay | Admitting: Cardiology

## 2023-06-25 ENCOUNTER — Ambulatory Visit: Payer: Medicare Other | Attending: Physician Assistant | Admitting: Cardiology

## 2023-06-25 VITALS — BP 124/70 | HR 75 | Ht 66.0 in | Wt 164.4 lb

## 2023-06-25 DIAGNOSIS — I5032 Chronic diastolic (congestive) heart failure: Secondary | ICD-10-CM | POA: Diagnosis not present

## 2023-06-25 DIAGNOSIS — I4821 Permanent atrial fibrillation: Secondary | ICD-10-CM | POA: Diagnosis not present

## 2023-06-25 DIAGNOSIS — I10 Essential (primary) hypertension: Secondary | ICD-10-CM

## 2023-06-25 DIAGNOSIS — Z952 Presence of prosthetic heart valve: Secondary | ICD-10-CM | POA: Diagnosis not present

## 2023-06-25 NOTE — Patient Instructions (Signed)
Medication Instructions:  Increase Lasix to 80 mg daily for 3 days and then reduce dose back to 40mg . Increase Potassium 40 meq daily for 3 days (same days as increase as lasix) then reduce dose back to 20 meq.  *If you need a refill on your cardiac medications before your next appointment, please call your pharmacy*   Lab Work: CBC, CMET, BNP today. If you have labs (blood work) drawn today and your tests are completely normal, you will receive your results only by: MyChart Message (if you have MyChart) OR A paper copy in the mail If you have any lab test that is abnormal or we need to change your treatment, we will call you to review the results.    Follow-Up: At [facility], you and your health needs are our priority.  As part of our continuing mission to provide you with exceptional heart care, we have created designated Provider Care Teams.  These Care Teams include your primary Cardiologist (physician) and Advanced Practice Providers (APPs -  Physician Assistants and Nurse Practitioners) who all work together to provide you with the care you need, when you need it.  We recommend signing up for the patient portal called "MyChart".  Sign up information is provided on this After Visit Summary.  MyChart is used to connect with patients for Virtual Visits (Telemedicine).  Patients are able to view lab/test results, encounter notes, upcoming appointments, etc.  Non-urgent messages can be sent to your provider as well.   To learn more about what you can do with MyChart, go to [email or web address].    Your next appointment:   6 month(s)  Provider:   Olga Millers, MD

## 2023-06-26 LAB — BASIC METABOLIC PANEL
BUN/Creatinine Ratio: 25 — ABNORMAL HIGH (ref 10–24)
BUN: 27 mg/dL (ref 8–27)
CO2: 28 mmol/L (ref 20–29)
Calcium: 9 mg/dL (ref 8.6–10.2)
Chloride: 101 mmol/L (ref 96–106)
Creatinine, Ser: 1.1 mg/dL (ref 0.76–1.27)
Glucose: 92 mg/dL (ref 70–99)
Potassium: 3.7 mmol/L (ref 3.5–5.2)
Sodium: 140 mmol/L (ref 134–144)
eGFR: 66 mL/min/{1.73_m2} (ref >59)

## 2023-06-26 LAB — CBC
Hematocrit: 38.3 % (ref 37.5–51.0)
Hemoglobin: 12.4 g/dL — ABNORMAL LOW (ref 13.0–17.7)
MCH: 32.5 pg (ref 26.6–33.0)
MCHC: 32.4 g/dL (ref 31.5–35.7)
MCV: 101 fL — ABNORMAL HIGH (ref 79–97)
Platelets: 183 10*3/uL (ref 150–450)
RBC: 3.81 x10E6/uL — ABNORMAL LOW (ref 4.14–5.80)
RDW: 13.2 % (ref 11.6–15.4)
WBC: 8.4 10*3/uL (ref 3.4–10.8)

## 2023-06-26 LAB — BRAIN NATRIURETIC PEPTIDE: BNP: 352.7 pg/mL — ABNORMAL HIGH (ref 0.0–100.0)

## 2023-06-27 ENCOUNTER — Telehealth: Payer: Self-pay | Admitting: Cardiology

## 2023-06-27 DIAGNOSIS — I10 Essential (primary) hypertension: Secondary | ICD-10-CM

## 2023-06-27 NOTE — Addendum Note (Signed)
Addended by: Kurtis Bushman on: 06/27/2023 09:44 AM   Modules accepted: Orders

## 2023-06-27 NOTE — Telephone Encounter (Signed)
Spoke with patient and below lab message relayed. Verbalized understanding and agree. No questions at this time. Order placed for BMET.     BNP mildly elevated; increase lasix to 60 mg daily (after recent changes); bmet  one week.   Olga Millers

## 2023-06-27 NOTE — Telephone Encounter (Signed)
Patient is calling requesting a callback from RN Stanton Kidney to discuss his lab results.   Please advise.

## 2023-06-28 ENCOUNTER — Other Ambulatory Visit: Payer: Self-pay | Admitting: Cardiology

## 2023-06-28 ENCOUNTER — Telehealth: Payer: Self-pay | Admitting: *Deleted

## 2023-06-28 DIAGNOSIS — R609 Edema, unspecified: Secondary | ICD-10-CM

## 2023-06-28 MED ORDER — FUROSEMIDE 20 MG PO TABS
60.0000 mg | ORAL_TABLET | Freq: Every day | ORAL | 3 refills | Status: DC
Start: 2023-06-28 — End: 2024-04-23

## 2023-06-28 NOTE — Telephone Encounter (Signed)
-----   Message from Olga Millers sent at 06/27/2023  6:35 AM EDT ----- BNP mildly elevated; increase lasix to 60 mg daily (after recent changes); bmet  one week. Olga Millers

## 2023-06-28 NOTE — Telephone Encounter (Signed)
Spoke with pt, he would like a script for the 20 mg furosemide 3 tablets once daily.  New script sent to the pharmacy  Lab orders mailed to the pt

## 2023-07-06 ENCOUNTER — Ambulatory Visit (INDEPENDENT_AMBULATORY_CARE_PROVIDER_SITE_OTHER): Payer: Medicare Other

## 2023-07-06 DIAGNOSIS — R609 Edema, unspecified: Secondary | ICD-10-CM | POA: Diagnosis not present

## 2023-07-06 DIAGNOSIS — Z7901 Long term (current) use of anticoagulants: Secondary | ICD-10-CM

## 2023-07-06 LAB — POCT INR: INR: 1.7 — AB (ref 2.0–3.0)

## 2023-07-06 MED ORDER — WARFARIN SODIUM 5 MG PO TABS
ORAL_TABLET | ORAL | 1 refills | Status: DC
Start: 2023-07-06 — End: 2023-09-21

## 2023-07-06 NOTE — Patient Instructions (Addendum)
Pre visit review using our clinic review tool, if applicable. No additional management support is needed unless otherwise documented below in the visit note.  Increase dose today to take 1 tablet and then change weekly dose to take 1/2 tablet daily except take 1 tablet on Wednesdays. Recheck in 2 weeks.

## 2023-07-06 NOTE — Progress Notes (Signed)
Pt also taking prednisone 10 mg QD.  Pt denies any other changes. Increase in diuretic to 60 mg, from 40 mg.  Pt has not taken his warfarin dose this morning and he takes all of his doses in the morning. Increase dose today to take 1 tablet and then change weekly dose to take 1/2 tablet daily except take 1 tablets. Recheck in 2 weeks.

## 2023-07-07 LAB — BASIC METABOLIC PANEL
BUN/Creatinine Ratio: 16 (ref 10–24)
BUN: 18 mg/dL (ref 8–27)
CO2: 32 mmol/L — ABNORMAL HIGH (ref 20–29)
Calcium: 9.4 mg/dL (ref 8.6–10.2)
Chloride: 99 mmol/L (ref 96–106)
Creatinine, Ser: 1.1 mg/dL (ref 0.76–1.27)
Glucose: 96 mg/dL (ref 70–99)
Potassium: 3.9 mmol/L (ref 3.5–5.2)
Sodium: 141 mmol/L (ref 134–144)
eGFR: 66 mL/min/{1.73_m2} (ref >59)

## 2023-07-08 ENCOUNTER — Other Ambulatory Visit: Payer: Self-pay | Admitting: Internal Medicine

## 2023-07-09 ENCOUNTER — Other Ambulatory Visit: Payer: Self-pay | Admitting: Internal Medicine

## 2023-07-09 ENCOUNTER — Telehealth: Payer: Self-pay | Admitting: Cardiology

## 2023-07-09 DIAGNOSIS — G4700 Insomnia, unspecified: Secondary | ICD-10-CM

## 2023-07-09 NOTE — Telephone Encounter (Signed)
Below message relayed to patient.No questions at this time.     Jared Bunting, MD 07/09/2023  8:58 AM EST   No change in meds Olga Millers

## 2023-07-09 NOTE — Telephone Encounter (Signed)
Pt is requesting a callback regarding her results. Please advise

## 2023-07-10 ENCOUNTER — Other Ambulatory Visit: Payer: Self-pay | Admitting: Internal Medicine

## 2023-07-10 DIAGNOSIS — M150 Primary generalized (osteo)arthritis: Secondary | ICD-10-CM

## 2023-07-11 ENCOUNTER — Telehealth: Payer: Self-pay | Admitting: Cardiology

## 2023-07-11 MED ORDER — POTASSIUM CHLORIDE CRYS ER 20 MEQ PO TBCR: 20.0000 meq | EXTENDED_RELEASE_TABLET | Freq: Every day | ORAL | 3 refills | Status: DC

## 2023-07-11 NOTE — Telephone Encounter (Signed)
Pt's medication was sent to pt's pharmacy as requested. Confirmation received.  °

## 2023-07-11 NOTE — Telephone Encounter (Signed)
*  STAT* If patient is at the pharmacy, call can be transferred to refill team.   1. Which medications need to be refilled? (please list name of each medication and dose if known) potassium chloride SA (KLOR-CON M) 20 MEQ tablet    2. Would you like to learn more about the convenience, safety, & potential cost savings by using the [facility]? No      3. Are you open to using the [facility] (Type [facility]. No ).   4. Which pharmacy/location (including street and city if local pharmacy) is medication to be sent to? [facility] [address] [place], [place] - [address]     5. Do they need a 30 day or 90 day supply? 90

## 2023-07-20 ENCOUNTER — Ambulatory Visit: Payer: Medicare Other

## 2023-07-30 DIAGNOSIS — H2513 Age-related nuclear cataract, bilateral: Secondary | ICD-10-CM | POA: Diagnosis not present

## 2023-08-03 ENCOUNTER — Ambulatory Visit (INDEPENDENT_AMBULATORY_CARE_PROVIDER_SITE_OTHER): Payer: Medicare Other | Admitting: Family Medicine

## 2023-08-03 ENCOUNTER — Ambulatory Visit (INDEPENDENT_AMBULATORY_CARE_PROVIDER_SITE_OTHER): Payer: Medicare Other

## 2023-08-03 ENCOUNTER — Encounter: Payer: Self-pay | Admitting: Family Medicine

## 2023-08-03 VITALS — BP 132/78 | HR 79 | Temp 98.1°F | Ht 66.0 in | Wt 164.8 lb

## 2023-08-03 DIAGNOSIS — Z7901 Long term (current) use of anticoagulants: Secondary | ICD-10-CM

## 2023-08-03 DIAGNOSIS — T148XXA Other injury of unspecified body region, initial encounter: Secondary | ICD-10-CM

## 2023-08-03 DIAGNOSIS — I359 Nonrheumatic aortic valve disorder, unspecified: Secondary | ICD-10-CM | POA: Diagnosis not present

## 2023-08-03 DIAGNOSIS — M19072 Primary osteoarthritis, left ankle and foot: Secondary | ICD-10-CM | POA: Diagnosis not present

## 2023-08-03 DIAGNOSIS — I4811 Longstanding persistent atrial fibrillation: Secondary | ICD-10-CM | POA: Diagnosis not present

## 2023-08-03 DIAGNOSIS — R2242 Localized swelling, mass and lump, left lower limb: Secondary | ICD-10-CM

## 2023-08-03 DIAGNOSIS — M79672 Pain in left foot: Secondary | ICD-10-CM

## 2023-08-03 DIAGNOSIS — M7732 Calcaneal spur, left foot: Secondary | ICD-10-CM | POA: Diagnosis not present

## 2023-08-03 DIAGNOSIS — R7989 Other specified abnormal findings of blood chemistry: Secondary | ICD-10-CM | POA: Diagnosis not present

## 2023-08-03 DIAGNOSIS — M7989 Other specified soft tissue disorders: Secondary | ICD-10-CM | POA: Diagnosis not present

## 2023-08-03 LAB — POCT INR: INR: 2.7 (ref 2.0–3.0)

## 2023-08-03 NOTE — Patient Instructions (Signed)
We are getting an xray today. We will be in contact with any abnormal results that require further attention.  We are checking labs today, will be in contact with any results that require further attention

## 2023-08-03 NOTE — Progress Notes (Signed)
Acute Office Visit  Subjective:     Patient ID: [name], male    DOB: 09/11/1935, 87 y.o.   MRN: [id number]  Chief Complaint  Patient presents with   Foot Swelling    Patient states he doesn't know where it came from on L foot.Marland Kitchen Has been there for about a week. Hurts only when touching the area     HPI 87 year old male presents for evaluation of left foot redness, swelling, bruising to the dorsal surface of left foot for the last 10 days. Has a past medical history of A-fib, aortic valve disorder, long-term use anticoagulants and was seen in this office for INR check today.  INR is 2.7. Denies known injury to the area, denies any discharge from the area, previous symptoms. Denies exquisite tenderness to the area, any sensation or temperature change in the foot, other symptoms today. Denies other concerns. Medical history as outlined below.  ROS Per HPI      Objective:    BP 132/78 (BP Location: Left Arm, Patient Position: Sitting, Cuff Size: Normal)   Pulse 79   Temp 98.1 F (36.7 C) (Oral)   Ht 5\' 6"  (1.676 m)   Wt 164 lb 12.8 oz (74.8 kg)   SpO2 96%   BMI 26.60 kg/m    Physical Exam Vitals and nursing note reviewed.  Constitutional:      Appearance: Normal appearance.  HENT:     Head: Normocephalic and atraumatic.  Eyes:     Extraocular Movements: Extraocular movements intact.  Cardiovascular:     Pulses: Normal pulses.  Pulmonary:     Effort: Pulmonary effort is normal.  Musculoskeletal:        General: Swelling and tenderness present. Normal range of motion.     Cervical back: Normal range of motion.  Skin:    Capillary Refill: Capillary refill takes 2 to 3 seconds.     Findings: Bruising and erythema present.     Comments: 2 cm area of erythema, mild swelling to mid dorsal surface of L foot. Bruising extends to all 5 toes and up to the L ankle. Area is mildly tender. No discharge, central induration, lesion, red streaking, or obvious deformity to  the area.  Neurological:     General: No focal deficit present.     Mental Status: He is alert and oriented to person, place, and time.     Cranial Nerves: No cranial nerve deficit.     Sensory: No sensory deficit.     Motor: No weakness.     Coordination: Coordination normal.     Gait: Gait normal.     Comments: Warm toes to L foot, sensation intact, strength intact, no temperature or skin color change distal to area of erythema     Results for orders placed or performed in visit on 08/03/23  POCT INR  Result Value Ref Range   INR 2.7 2.0 - 3.0        Assessment & Plan:  1. Left foot pain  - DG Foot Complete Left; Future  2. Localized swelling of left foot  - DG Foot Complete Left; Future - D-dimer, quantitative  3. Bruising  - DG Foot Complete Left; Future - D-dimer, quantitative  4. Longstanding persistent atrial fibrillation (HCC)  - D-dimer, quantitative  5. Long term (current) use of anticoagulants [Z79.01]  - D-dimer, quantitative  6. Aortic valve disorder  - D-dimer, quantitative    - Low suspicion of blood clot given INR of  2.7, but will check Ddimer to rule out given PMH of A fib, s/p aortic valve replacement and long term anticoag use - More likely injury - We are checking labs today, will be in contact with any results that require further attention - We are getting an xray today. We will be in contact with any abnormal results that require further attention.    No orders of the defined types were placed in this encounter.   Return if symptoms worsen or fail to improve.  Moshe Cipro, FNP

## 2023-08-03 NOTE — Patient Instructions (Addendum)
Pre visit review using our clinic review tool, if applicable. No additional management support is needed unless otherwise documented below in the visit note.  Hold dose tomorrow and then continue 1/2 tablet daily except take 1 tablets. Recheck in 2 weeks.

## 2023-08-03 NOTE — Progress Notes (Addendum)
Pt also taking prednisone 10 mg QD.  Pt reports small spot on L foot with swelling, redness, pain with touch, and warmth x 1 week. Assessed by this nurse and has potential to be a clot. There is also some peripheral bruising which pt denies any injury to that foot. Made apt with provider for pt this afternoon.  Pt will also have an acute visit today with provider. INR today slightly supratherapeutic but last INR on 11/8, INR was subtherapeutic at 1.7 Pt already took warfarin today. Hold dose tomorrow and then continue 1/2 tablet daily except take 1 tablets. Recheck in 2 weeks.

## 2023-08-04 LAB — D-DIMER, QUANTITATIVE: D-Dimer, Quant: 1.38 ug{FEU}/mL — ABNORMAL HIGH (ref <0.50)

## 2023-08-06 ENCOUNTER — Ambulatory Visit (HOSPITAL_COMMUNITY)
Admission: RE | Admit: 2023-08-06 | Discharge: 2023-08-06 | Disposition: A | Discharge status: Home / Self Care | Payer: Medicare Other | Source: Ambulatory Visit | Attending: Family Medicine | Admitting: Family Medicine

## 2023-08-06 DIAGNOSIS — M79672 Pain in left foot: Secondary | ICD-10-CM

## 2023-08-06 DIAGNOSIS — R2242 Localized swelling, mass and lump, left lower limb: Secondary | ICD-10-CM

## 2023-08-06 DIAGNOSIS — R7989 Other specified abnormal findings of blood chemistry: Secondary | ICD-10-CM | POA: Diagnosis not present

## 2023-08-06 NOTE — Addendum Note (Signed)
Addended by: Delsa Grana R on: 08/06/2023 11:36 AM   Modules accepted: Orders

## 2023-08-06 NOTE — Addendum Note (Signed)
Addended by: Delsa Grana R on: 08/06/2023 08:44 AM   Modules accepted: Orders

## 2023-08-06 NOTE — Addendum Note (Signed)
Addended by: Sherald Barge on: 08/06/2023 08:35 AM   Modules accepted: Orders

## 2023-08-07 ENCOUNTER — Telehealth: Payer: Self-pay

## 2023-08-07 ENCOUNTER — Encounter (HOSPITAL_COMMUNITY): Payer: Medicare Other

## 2023-08-07 NOTE — Progress Notes (Signed)
There are no blood clots seen on your recent imaging!

## 2023-08-07 NOTE — Telephone Encounter (Signed)
Missed call today from pt. Contacted pt who reports his [place] of his foot and lab work all came back negative for a blood clot. X-ray did not reveal any fracture. Pt reports bump is the same but no worse. Advised pt if any worsening to contact the office. Pt verbalized understanding and was appreciative of the call.

## 2023-08-08 ENCOUNTER — Other Ambulatory Visit: Payer: Self-pay | Admitting: Internal Medicine

## 2023-08-08 DIAGNOSIS — M150 Primary generalized (osteo)arthritis: Secondary | ICD-10-CM

## 2023-08-17 ENCOUNTER — Telehealth: Payer: Self-pay

## 2023-08-17 ENCOUNTER — Ambulatory Visit: Payer: Medicare Other

## 2023-08-17 NOTE — Telephone Encounter (Signed)
Pt cancelled coumadin clinic apt today. Contacted pt to RS and he reported he had a fall this morning and landed on his butt. No injuries and was assessed by an RN which is his neighbor. He reports no acute pain but he is sore and did not want to make the apt today. He reports he will call on Monday, 12/23 and try to make an apt for that day. If he does not make it for 12/23 he will have to wait until 1/3. Advised if any changes to contact the office. Pt verbalized understanding.

## 2023-08-20 ENCOUNTER — Ambulatory Visit (INDEPENDENT_AMBULATORY_CARE_PROVIDER_SITE_OTHER): Payer: Medicare Other

## 2023-08-20 DIAGNOSIS — Z7901 Long term (current) use of anticoagulants: Secondary | ICD-10-CM

## 2023-08-20 LAB — POCT INR: INR: 1.9 — AB (ref 2.0–3.0)

## 2023-08-20 NOTE — Patient Instructions (Addendum)
Pre visit review using our clinic review tool, if applicable. No additional management support is needed unless otherwise documented below in the visit note.  Increase dose today to take 1 tablet and then continue 1/2 tablet daily except take 1 tablet on Wednesdays. Recheck in 4 weeks.

## 2023-08-20 NOTE — Progress Notes (Signed)
Pt also taking prednisone 10 mg QD.  Pt denies any other changes. Pt already took warfarin today. Increase dose today to take 1 tablet and then continue 1/2 tablet daily except take 1 tablet on Wednesdays. Recheck in 4 weeks.

## 2023-09-08 ENCOUNTER — Other Ambulatory Visit: Payer: Self-pay | Admitting: Internal Medicine

## 2023-09-08 DIAGNOSIS — M150 Primary generalized (osteo)arthritis: Secondary | ICD-10-CM

## 2023-09-17 ENCOUNTER — Ambulatory Visit: Payer: Self-pay | Admitting: Internal Medicine

## 2023-09-17 NOTE — Telephone Encounter (Signed)
 Appointment scheduled 09/18/23

## 2023-09-17 NOTE — Telephone Encounter (Signed)
Copied from CRM [id number]. Topic: Clinical - Red Word Triage >> Sep 17, 2023  8:27 AM [name] wrote: Red Word that prompted transfer to Nurse Triage: Patient called in stating every time he gets up he feels very dizzy, like he's going to pass out    Chief Complaint: Dizziness Symptoms: Dizziness Frequency: Ongoing since Saturday Pertinent Negatives: Patient denies headaches Disposition: [] ED /[] Urgent Care (no appt availability in office) / [x] Appointment(In office/virtual)/ []  [place] [facility]/ [] Home Care/ [] Refused Recommended Disposition /[]  Mobile Bus/ []  Follow-up with PCP  Additional Notes: Patient stated he is having intermittent dizziness that started over the weekend. He is not dizzy at the moment, but it is typically happening when he stands. He is still able to walk, but feels like he might fall sometimes. Patient scheduled for appointment on 1/21    Reason for Disposition  [1] MODERATE dizziness (e.g., interferes with normal activities) AND [2] has NOT been evaluated by doctor (or NP/PA) for this  (Exception: Dizziness caused by heat exposure, sudden standing, or poor fluid intake.)  Answer Assessment - Initial Assessment Questions 1. DESCRIPTION: "Describe your dizziness."     Feels like falling down sometimes, sensation comes and goes, lasts 5-6 minutes. No dizziness right now,   2. LIGHTHEADED: "Do you feel lightheaded?" (e.g., somewhat faint, woozy, weak upon standing)     Dizzy upon standing  3. VERTIGO: "Do you feel like either you or the room is spinning or tilting?" (i.e. vertigo)     No  4. SEVERITY: "How bad is it?"  "Do you feel like you are going to faint?" "Can you stand and walk?"   - MILD: Feels slightly dizzy, but walking normally.   - MODERATE: Feels unsteady when walking, but not falling; interferes with normal activities (e.g., school, work).   - SEVERE: Unable to walk without falling, or requires assistance to walk without falling;  feels like passing out now.      Patient still able to walk with a walker but feels like he might fall when he is dizzy  5. ONSET:  "When did the dizziness begin?"     Saturday  6. AGGRAVATING FACTORS: "Does anything make it worse?" (e.g., standing, change in head position)     Standing  7. CAUSE: "What do you think is causing the dizziness?"     Unknown  8. RECURRENT SYMPTOM: "Have you had dizziness before?" If Yes, ask: "When was the last time?" "What happened that time?"     Has not happened before   9. OTHER SYMPTOMS: "Do you have any other symptoms?" (e.g., fever, chest pain, vomiting, diarrhea, bleeding)       No other symptoms  Protocols used: Dizziness - Lightheadedness-A-AH

## 2023-09-18 ENCOUNTER — Encounter: Payer: Self-pay | Admitting: Internal Medicine

## 2023-09-18 ENCOUNTER — Ambulatory Visit (INDEPENDENT_AMBULATORY_CARE_PROVIDER_SITE_OTHER): Payer: Medicare Other | Admitting: Internal Medicine

## 2023-09-18 VITALS — BP 166/72 | HR 84 | Temp 97.3°F

## 2023-09-18 DIAGNOSIS — R42 Dizziness and giddiness: Secondary | ICD-10-CM

## 2023-09-18 DIAGNOSIS — R531 Weakness: Secondary | ICD-10-CM | POA: Diagnosis not present

## 2023-09-18 DIAGNOSIS — K068 Other specified disorders of gingiva and edentulous alveolar ridge: Secondary | ICD-10-CM | POA: Diagnosis not present

## 2023-09-18 DIAGNOSIS — Z7901 Long term (current) use of anticoagulants: Secondary | ICD-10-CM

## 2023-09-18 LAB — CBC WITH DIFFERENTIAL/PLATELET
Basophils Absolute: 0 10*3/uL (ref 0.0–0.1)
Basophils Relative: 0.1 % (ref 0.0–3.0)
Eosinophils Absolute: 0 10*3/uL (ref 0.0–0.7)
Eosinophils Relative: 0 % (ref 0.0–5.0)
HCT: 26.8 % — ABNORMAL LOW (ref 39.0–52.0)
Hemoglobin: 8.9 g/dL — ABNORMAL LOW (ref 13.0–17.0)
Lymphocytes Relative: 5.6 % — ABNORMAL LOW (ref 12.0–46.0)
Lymphs Abs: 0.4 10*3/uL — ABNORMAL LOW (ref 0.7–4.0)
MCHC: 33.2 g/dL (ref 30.0–36.0)
MCV: 104 fL — ABNORMAL HIGH (ref 78.0–100.0)
Monocytes Absolute: 0.2 10*3/uL (ref 0.1–1.0)
Monocytes Relative: 3.1 % (ref 3.0–12.0)
Neutro Abs: 5.8 10*3/uL (ref 1.4–7.7)
Neutrophils Relative %: 91.2 % — ABNORMAL HIGH (ref 43.0–77.0)
Platelets: 196 10*3/uL (ref 150.0–400.0)
RBC: 2.58 Mil/uL — ABNORMAL LOW (ref 4.22–5.81)
RDW: 15.4 % (ref 11.5–15.5)
WBC: 6.4 10*3/uL (ref 4.0–10.5)

## 2023-09-18 NOTE — Progress Notes (Signed)
Established Patient Office Visit     CC/Reason for Visit: Dizziness, generalized weakness  HPI: [name] is a 88 y.o. male who is coming in today for the above mentioned reasons. Past Medical History is significant for: A-fib on chronic anticoagulation with Coumadin.  Last Thursday, 5 days ago had a dental procedure where they removed 3 cracked teeth.  He then had subsequent bleeding for what he describes as a little bit over 24 hours.  He states it was significant enough to where every 20 seconds or so he was spitting up bright red blood.  He was swallowing some of the blood and has noticed some dark stools as well.  The next day bleeding stopped without further recurrence.  Ever since then he has been feeling dizzy when standing up and very weak to where he has to hold onto the walls as he stands.  He has been checking his blood pressure and it affect has been high not low.  Today in office it was 166/72.   Past Medical/Surgical History: Past Medical History:  Diagnosis Date   Aortic stenosis    Atrial fibrillation (HCC)    Baker cyst    Left knee   DJD (degenerative joint disease)    knees   Dysrhythmia    Eczema    Erectile dysfunction    Gout    History of hiatal hernia    History of radiation therapy    Right lung- 04/21/21-05/03/21 Dr. Antony Blackbird   HTN (hypertension)    MVP (mitral valve prolapse)    Shortness of breath dyspnea    with activity    Past Surgical History:  Procedure Laterality Date    polyps vocal cord  1994   AORTIC VALVE REPLACEMENT  ~2004   BRONCHIAL BIOPSY  03/07/2021   Procedure: BRONCHIAL BIOPSIES;  Surgeon: Leslye Peer, MD;  Location: [facility] ENDOSCOPY;  Service: Pulmonary;;   BRONCHIAL BIOPSY  11/14/2021   Procedure: BRONCHIAL BIOPSIES;  Surgeon: Leslye Peer, MD;  Location: [facility] ENDOSCOPY;  Service: Pulmonary;;   BRONCHIAL BRUSHINGS  03/07/2021   Procedure: BRONCHIAL BRUSHINGS;  Surgeon: Leslye Peer, MD;  Location: [facility]  ENDOSCOPY;  Service: Pulmonary;;   BRONCHIAL BRUSHINGS  11/14/2021   Procedure: BRONCHIAL BRUSHINGS;  Surgeon: Leslye Peer, MD;  Location: [facility] ENDOSCOPY;  Service: Pulmonary;;   BRONCHIAL NEEDLE ASPIRATION BIOPSY  03/07/2021   Procedure: BRONCHIAL NEEDLE ASPIRATION BIOPSIES;  Surgeon: Leslye Peer, MD;  Location: [facility] ENDOSCOPY;  Service: Pulmonary;;   BRONCHIAL NEEDLE ASPIRATION BIOPSY  11/14/2021   Procedure: BRONCHIAL NEEDLE ASPIRATION BIOPSIES;  Surgeon: Leslye Peer, MD;  Location: [facility] ENDOSCOPY;  Service: Pulmonary;;   BRONCHIAL WASHINGS  09/21/2020   Procedure: BRONCHIAL WASHINGS;  Surgeon: Martina Sinner, MD;  Location: [facility] ENDOSCOPY;  Service: Pulmonary;;  BAL    BRONCHIAL WASHINGS  03/07/2021   Procedure: BRONCHIAL WASHINGS;  Surgeon: Leslye Peer, MD;  Location: [facility] ENDOSCOPY;  Service: Pulmonary;;   BRONCHIAL WASHINGS  11/14/2021   Procedure: BRONCHIAL WASHINGS;  Surgeon: Leslye Peer, MD;  Location: [facility] ENDOSCOPY;  Service: Pulmonary;;   FIDUCIAL MARKER PLACEMENT  03/07/2021   Procedure: FIDUCIAL MARKER PLACEMENT;  Surgeon: Leslye Peer, MD;  Location: [facility] ENDOSCOPY;  Service: Pulmonary;;   HERNIA REPAIR  ~1978   HERNIA REPAIR  ~1983   IR ANGIOGRAM PELVIS SELECTIVE OR SUPRASELECTIVE  12/01/2022   IR ANGIOGRAM PELVIS SELECTIVE OR SUPRASELECTIVE  12/01/2022   IR ANGIOGRAM SELECTIVE EACH ADDITIONAL VESSEL  12/01/2022   IR ANGIOGRAM SELECTIVE EACH ADDITIONAL VESSEL  12/01/2022   IR CT SPINE LTD  12/01/2022   IR CT SPINE LTD  12/01/2022   IR CT SPINE LTD  12/01/2022   IR EMBO ARTERIAL NOT HEMORR HEMANG INC GUIDE ROADMAPPING  12/01/2022   IR EMBO TUMOR ORGAN ISCHEMIA INFARCT INC GUIDE ROADMAPPING  12/01/2022   IR RADIOLOGIST EVAL & MGMT  11/17/2022   IR US GUIDE VASC ACCESS LEFT  12/01/2022   IR US GUIDE VASC ACCESS RIGHT  12/01/2022   LUMBAR LAMINECTOMY/DECOMPRESSION MICRODISCECTOMY N/A 01/15/2015   Procedure: 2 LEVEL DECOMPRESSIVE LUMBAR LAMINECTOMY L3-L4,L4-L5;  Surgeon: Ranee Gosselin, MD;   Location: [facility] ORS;  Service: Orthopedics;  Laterality: N/A;   LUMBAR LAMINECTOMY/DECOMPRESSION MICRODISCECTOMY N/A 05/01/2017   Procedure: Lumbar one-Lumbar two, Lumbar two-Lumbar three, Lumbar three-Lumbar four Laminectomy; Evacuation of hematoma;  Surgeon: Lisbeth Renshaw, MD;  Location: [facility] OR;  Service: Neurosurgery;  Laterality: N/A;   right total hip  ~4 years ago   ROTATOR CUFF REPAIR Left ~2009   TOTAL KNEE ARTHROPLASTY Left 04/14/2015   Procedure: LEFT TOTAL KNEE ARTHROPLASTY;  Surgeon: Ranee Gosselin, MD;  Location: [facility] ORS;  Service: Orthopedics;  Laterality: Left;   VIDEO BRONCHOSCOPY N/A 09/21/2020   Procedure: VIDEO BRONCHOSCOPY WITHOUT FLUORO;  Surgeon: Martina Sinner, MD;  Location: [facility] ENDOSCOPY;  Service: Pulmonary;  Laterality: N/A;   VIDEO BRONCHOSCOPY WITH ENDOBRONCHIAL NAVIGATION Right 03/07/2021   Procedure: VIDEO BRONCHOSCOPY WITH ENDOBRONCHIAL NAVIGATION;  Surgeon: Leslye Peer, MD;  Location: [facility] ENDOSCOPY;  Service: Pulmonary;  Laterality: Right;   VIDEO BRONCHOSCOPY WITH RADIAL ENDOBRONCHIAL ULTRASOUND  11/14/2021   Procedure: VIDEO BRONCHOSCOPY WITH RADIAL ENDOBRONCHIAL ULTRASOUND;  Surgeon: Leslye Peer, MD;  Location: [facility] ENDOSCOPY;  Service: Pulmonary;;    Social History:  reports that he quit smoking about 21 years ago. His smoking use included cigarettes. He started smoking about 51 years ago. He has never used smokeless tobacco. He reports current alcohol use of about 3.0 standard drinks of alcohol per week. He reports that he does not use drugs.  Allergies: No Known Allergies  Family History:  Family History  Problem Relation Age of Onset   Diabetes Other    Stroke Other      Current Outpatient Medications:    allopurinol (ZYLOPRIM) 300 MG tablet, Take 1 tablet by mouth once daily, Disp: 90 tablet, Rfl: 1   amLODipine (NORVASC) 5 MG tablet, Take 1 tablet by mouth once daily, Disp: 90 tablet, Rfl: 1   furosemide (LASIX) 20 MG tablet, Take 3 tablets  (60 mg total) by mouth daily., Disp: 270 tablet, Rfl: 3   potassium chloride SA (KLOR-CON M) 20 MEQ tablet, Take 1 tablet (20 mEq total) by mouth daily., Disp: 90 tablet, Rfl: 3   predniSONE (DELTASONE) 20 MG tablet, Take 1 tablet by mouth once daily with breakfast, Disp: 90 tablet, Rfl: 0   tamsulosin (FLOMAX) 0.4 MG CAPS capsule, TAKE 1 CAPSULE BY MOUTH ONCE DAILY AFTER BREAKFAST, Disp: 90 capsule, Rfl: 0   traMADol (ULTRAM) 50 MG tablet, TAKE 1 TABLET BY MOUTH EVERY 12 HOURS AS NEEDED, Disp: 60 tablet, Rfl: 0   traZODone (DESYREL) 50 MG tablet, TAKE 1 TABLET BY MOUTH AT BEDTIME AS NEEDED, Disp: 90 tablet, Rfl: 0   triamcinolone cream (KENALOG) 0.1 %, APPLY  CREAM EXTERNALLY TO AFFECTED AREA TWICE DAILY, Disp: 80 g, Rfl: 0   VITAMIN D PO, Take 1 tablet by mouth daily., Disp: , Rfl:    warfarin (COUMADIN) 5  MG tablet, TAKE 1/2 TABLET BY MOUTH  DAILY EXCEPT TAKE 1 TABLET ON WEDNESDAY OR AS  DIRECTED  BY  ANTICOAGULATION  CLINIC, Disp: 75 tablet, Rfl: 1  Review of Systems:  Negative unless indicated in HPI.   Physical Exam: Vitals:   09/18/23 1554  BP: (!) 166/72  Pulse: 84  Temp: (!) 97.3 F (36.3 C)  TempSrc: Oral    There is no height or weight on file to calculate BMI.   Physical Exam Vitals reviewed.  Constitutional:      Comments: Pale appearing  HENT:     Head: Normocephalic and atraumatic.  Eyes:     Conjunctiva/sclera: Conjunctivae normal.  Cardiovascular:     Rate and Rhythm: Normal rate and regular rhythm.  Pulmonary:     Effort: Pulmonary effort is normal.     Breath sounds: Normal breath sounds.  Skin:    General: Skin is warm and dry.  Neurological:     General: No focal deficit present.     Mental Status: He is alert and oriented to person, place, and time.  Psychiatric:        Mood and Affect: Mood normal.        Behavior: Behavior normal.        Thought Content: Thought content normal.        Judgment: Judgment normal.      Impression and  Plan:  Bleeding gums -     CBC with Differential/Platelet; Future  Long term (current) use of anticoagulants -     CBC with Differential/Platelet; Future  Dizziness  Generalized weakness   -Concerned about critical blood loss.  I will order stat CBC.  Depending on results may need to go to the hospital for blood transfusion as it will take about a week to 10 days to set up a blood transfusion at short stay.  If not critically low then have advised him to increase fluids and take ferrous sulfate 325 mg twice daily.  Time spent:32 minutes reviewing chart, interviewing and examining patient and formulating plan of care.     Chaya Jan, MD [facility]

## 2023-09-19 ENCOUNTER — Observation Stay (HOSPITAL_COMMUNITY)
Admission: EM | Admit: 2023-09-19 | Discharge: 2023-09-21 | Disposition: A | Discharge status: Home / Self Care | Payer: Medicare Other | Attending: Internal Medicine | Admitting: Internal Medicine

## 2023-09-19 ENCOUNTER — Other Ambulatory Visit: Payer: Self-pay

## 2023-09-19 ENCOUNTER — Telehealth: Payer: Self-pay | Admitting: *Deleted

## 2023-09-19 ENCOUNTER — Encounter (HOSPITAL_COMMUNITY): Payer: Self-pay

## 2023-09-19 ENCOUNTER — Observation Stay (HOSPITAL_COMMUNITY): Payer: Medicare Other

## 2023-09-19 DIAGNOSIS — J439 Emphysema, unspecified: Secondary | ICD-10-CM | POA: Diagnosis not present

## 2023-09-19 DIAGNOSIS — Z79899 Other long term (current) drug therapy: Secondary | ICD-10-CM | POA: Diagnosis not present

## 2023-09-19 DIAGNOSIS — K922 Gastrointestinal hemorrhage, unspecified: Secondary | ICD-10-CM | POA: Diagnosis not present

## 2023-09-19 DIAGNOSIS — Z87891 Personal history of nicotine dependence: Secondary | ICD-10-CM | POA: Diagnosis not present

## 2023-09-19 DIAGNOSIS — I110 Hypertensive heart disease with heart failure: Secondary | ICD-10-CM | POA: Diagnosis not present

## 2023-09-19 DIAGNOSIS — E876 Hypokalemia: Secondary | ICD-10-CM | POA: Diagnosis not present

## 2023-09-19 DIAGNOSIS — Z952 Presence of prosthetic heart valve: Secondary | ICD-10-CM | POA: Insufficient documentation

## 2023-09-19 DIAGNOSIS — J90 Pleural effusion, not elsewhere classified: Secondary | ICD-10-CM | POA: Diagnosis not present

## 2023-09-19 DIAGNOSIS — M1990 Unspecified osteoarthritis, unspecified site: Secondary | ICD-10-CM | POA: Diagnosis not present

## 2023-09-19 DIAGNOSIS — Z7901 Long term (current) use of anticoagulants: Secondary | ICD-10-CM | POA: Diagnosis not present

## 2023-09-19 DIAGNOSIS — D649 Anemia, unspecified: Secondary | ICD-10-CM

## 2023-09-19 DIAGNOSIS — D62 Acute posthemorrhagic anemia: Secondary | ICD-10-CM | POA: Diagnosis not present

## 2023-09-19 DIAGNOSIS — I4891 Unspecified atrial fibrillation: Secondary | ICD-10-CM | POA: Diagnosis not present

## 2023-09-19 DIAGNOSIS — I5032 Chronic diastolic (congestive) heart failure: Secondary | ICD-10-CM | POA: Diagnosis not present

## 2023-09-19 DIAGNOSIS — I10 Essential (primary) hypertension: Secondary | ICD-10-CM | POA: Diagnosis not present

## 2023-09-19 DIAGNOSIS — K921 Melena: Secondary | ICD-10-CM

## 2023-09-19 DIAGNOSIS — Z96652 Presence of left artificial knee joint: Secondary | ICD-10-CM | POA: Diagnosis not present

## 2023-09-19 DIAGNOSIS — Z96641 Presence of right artificial hip joint: Secondary | ICD-10-CM | POA: Insufficient documentation

## 2023-09-19 DIAGNOSIS — R0600 Dyspnea, unspecified: Secondary | ICD-10-CM | POA: Diagnosis not present

## 2023-09-19 DIAGNOSIS — C3490 Malignant neoplasm of unspecified part of unspecified bronchus or lung: Secondary | ICD-10-CM | POA: Diagnosis not present

## 2023-09-19 LAB — COMPREHENSIVE METABOLIC PANEL
ALT: 18 U/L (ref 0–44)
AST: 22 U/L (ref 15–41)
Albumin: 3.9 g/dL (ref 3.5–5.0)
Alkaline Phosphatase: 43 U/L (ref 38–126)
Anion gap: 11 (ref 5–15)
BUN: 23 mg/dL (ref 8–23)
CO2: 27 mmol/L (ref 22–32)
Calcium: 8.9 mg/dL (ref 8.9–10.3)
Chloride: 101 mmol/L (ref 98–111)
Creatinine, Ser: 1.06 mg/dL (ref 0.61–1.24)
GFR, Estimated: >60 mL/min (ref >60)
Glucose, Bld: 108 mg/dL — ABNORMAL HIGH (ref 70–99)
Potassium: 3.3 mmol/L — ABNORMAL LOW (ref 3.5–5.1)
Sodium: 139 mmol/L (ref 135–145)
Total Bilirubin: 0.7 mg/dL (ref 0.0–1.2)
Total Protein: 6.3 g/dL — ABNORMAL LOW (ref 6.5–8.1)

## 2023-09-19 LAB — PROTIME-INR
INR: 1.4 — ABNORMAL HIGH (ref 0.8–1.2)
Prothrombin Time: 17.1 s — ABNORMAL HIGH (ref 11.4–15.2)

## 2023-09-19 LAB — CBC WITH DIFFERENTIAL/PLATELET
Abs Immature Granulocytes: 0.05 10*3/uL (ref 0.00–0.07)
Basophils Absolute: 0 10*3/uL (ref 0.0–0.1)
Basophils Relative: 0 %
Eosinophils Absolute: 0 10*3/uL (ref 0.0–0.5)
Eosinophils Relative: 0 %
HCT: 28.1 % — ABNORMAL LOW (ref 39.0–52.0)
Hemoglobin: 8.9 g/dL — ABNORMAL LOW (ref 13.0–17.0)
Immature Granulocytes: 1 %
Lymphocytes Relative: 5 %
Lymphs Abs: 0.4 10*3/uL — ABNORMAL LOW (ref 0.7–4.0)
MCH: 33.8 pg (ref 26.0–34.0)
MCHC: 31.7 g/dL (ref 30.0–36.0)
MCV: 106.8 fL — ABNORMAL HIGH (ref 80.0–100.0)
Monocytes Absolute: 0.2 10*3/uL (ref 0.1–1.0)
Monocytes Relative: 3 %
Neutro Abs: 8 10*3/uL — ABNORMAL HIGH (ref 1.7–7.7)
Neutrophils Relative %: 91 %
Platelets: 196 10*3/uL (ref 150–400)
RBC: 2.63 MIL/uL — ABNORMAL LOW (ref 4.22–5.81)
RDW: 15.3 % (ref 11.5–15.5)
WBC: 8.8 10*3/uL (ref 4.0–10.5)
nRBC: 0.2 % (ref 0.0–0.2)

## 2023-09-19 LAB — FOLATE: Folate: 21.5 ng/mL (ref >5.9)

## 2023-09-19 LAB — HEMOGLOBIN AND HEMATOCRIT, BLOOD
HCT: 27 % — ABNORMAL LOW (ref 39.0–52.0)
Hemoglobin: 8.6 g/dL — ABNORMAL LOW (ref 13.0–17.0)

## 2023-09-19 LAB — RETICULOCYTES
Immature Retic Fract: 26.8 % — ABNORMAL HIGH (ref 2.3–15.9)
RBC.: 2.59 MIL/uL — ABNORMAL LOW (ref 4.22–5.81)
Retic Count, Absolute: 160.3 10*3/uL (ref 19.0–186.0)
Retic Ct Pct: 6.2 % — ABNORMAL HIGH (ref 0.4–3.1)

## 2023-09-19 LAB — IRON AND TIBC
Iron: 38 ug/dL — ABNORMAL LOW (ref 45–182)
Saturation Ratios: 11 % — ABNORMAL LOW (ref 17.9–39.5)
TIBC: 346 ug/dL (ref 250–450)
UIBC: 308 ug/dL

## 2023-09-19 LAB — POC OCCULT BLOOD, ED: Fecal Occult Bld: POSITIVE — AB

## 2023-09-19 LAB — VITAMIN B12: Vitamin B-12: 292 pg/mL (ref 180–914)

## 2023-09-19 LAB — APTT: aPTT: 27 s (ref 24–36)

## 2023-09-19 LAB — FERRITIN: Ferritin: 85 ng/mL (ref 24–336)

## 2023-09-19 MED ORDER — POTASSIUM CHLORIDE CRYS ER 20 MEQ PO TBCR
40.0000 meq | EXTENDED_RELEASE_TABLET | Freq: Once | ORAL | Status: AC
  Administered 2023-09-19: 40 meq via ORAL
  Filled 2023-09-19: qty 4

## 2023-09-19 MED ORDER — ACETAMINOPHEN 325 MG PO TABS
650.0000 mg | ORAL_TABLET | Freq: Four times a day (QID) | ORAL | Status: DC | PRN
Start: 2023-09-19 — End: 2023-09-21

## 2023-09-19 MED ORDER — AMLODIPINE BESYLATE 5 MG PO TABS
5.0000 mg | ORAL_TABLET | Freq: Every day | ORAL | Status: DC
  Administered 2023-09-20 – 2023-09-21 (×2): 5 mg via ORAL
  Filled 2023-09-19 (×2): qty 1

## 2023-09-19 MED ORDER — PANTOPRAZOLE SODIUM 40 MG IV SOLR
40.0000 mg | Freq: Two times a day (BID) | INTRAVENOUS | Status: DC
  Administered 2023-09-19 – 2023-09-21 (×4): 40 mg via INTRAVENOUS
  Filled 2023-09-19 (×4): qty 10

## 2023-09-19 MED ORDER — ONDANSETRON HCL 4 MG PO TABS: 4.0000 mg | ORAL_TABLET | Freq: Four times a day (QID) | ORAL | Status: DC | PRN

## 2023-09-19 MED ORDER — HYDRALAZINE HCL 20 MG/ML IJ SOLN
5.0000 mg | Freq: Four times a day (QID) | INTRAMUSCULAR | Status: DC | PRN
Start: 2023-09-19 — End: 2023-09-21

## 2023-09-19 MED ORDER — TRAZODONE HCL 50 MG PO TABS
50.0000 mg | ORAL_TABLET | Freq: Every evening | ORAL | Status: DC | PRN
  Administered 2023-09-19 – 2023-09-20 (×2): 50 mg via ORAL
  Filled 2023-09-19 (×2): qty 1

## 2023-09-19 MED ORDER — LACTATED RINGERS IV SOLN
INTRAVENOUS | Status: DC
Start: 2023-09-19 — End: 2023-09-20

## 2023-09-19 MED ORDER — ACETAMINOPHEN 650 MG RE SUPP: 650.0000 mg | Freq: Four times a day (QID) | RECTAL | Status: DC | PRN

## 2023-09-19 MED ORDER — ONDANSETRON HCL 4 MG/2ML IJ SOLN: 4.0000 mg | Freq: Four times a day (QID) | INTRAMUSCULAR | Status: DC | PRN

## 2023-09-19 MED ORDER — PANTOPRAZOLE SODIUM 40 MG IV SOLR
40.0000 mg | Freq: Once | INTRAVENOUS | Status: AC
  Administered 2023-09-19: 40 mg via INTRAVENOUS
  Filled 2023-09-19: qty 10

## 2023-09-19 MED ORDER — PREDNISONE 20 MG PO TABS
20.0000 mg | ORAL_TABLET | Freq: Every day | ORAL | Status: DC
  Administered 2023-09-20 – 2023-09-21 (×2): 20 mg via ORAL
  Filled 2023-09-19 (×2): qty 1

## 2023-09-19 MED ORDER — TRAMADOL HCL 50 MG PO TABS: 50.0000 mg | ORAL_TABLET | Freq: Two times a day (BID) | ORAL | Status: DC | PRN

## 2023-09-19 MED ORDER — ALLOPURINOL 300 MG PO TABS
300.0000 mg | ORAL_TABLET | Freq: Every day | ORAL | Status: DC
  Administered 2023-09-20 – 2023-09-21 (×2): 300 mg via ORAL
  Filled 2023-09-19 (×2): qty 1

## 2023-09-19 NOTE — ED Notes (Signed)
..ED TO INPATIENT HANDOFF REPORT  Name/Age/Gender [name] 88 y.o. male  Code Status    Code Status Orders  (From admission, onward)           Start     Ordered   09/19/23 1828  Full code  Continuous       Question:  By:  Answer:  Consent: discussion documented in EHR   09/19/23 1829           Code Status History     Date Active Date Inactive Code Status Order ID Comments User Context   05/01/2017 1839 05/03/2017 1535 Full Code [id number]  Lisbeth Renshaw, MD Inpatient   04/14/2015 1306 04/17/2015 1635 Full Code [id number]  Ranee Gosselin, MD Inpatient   01/15/2015 1657 01/16/2015 1643 Full Code [id number]  Ranee Gosselin, MD Inpatient       Home/SNF/Other Home  Chief Complaint GI bleed [K92.2]  Level of Care/Admitting Diagnosis ED Disposition     ED Disposition  Admit   Condition  --   Comment  Hospital Area: [facility] [[id number]]  Level of Care: Telemetry [5]  Admit to tele based on following criteria: Monitor QTC interval  May place patient in observation at [facility] or [facility] if equivalent level of care is available:: No  Covid Evaluation: Asymptomatic - no recent exposure (last 10 days) testing not required  Diagnosis: GI bleed [284132]  Admitting Physician: Alba Cory [[id number]]  Attending Physician: Alba Cory [phone]          Medical History Past Medical History:  Diagnosis Date   Aortic stenosis    Atrial fibrillation (HCC)    Baker cyst    Left knee   DJD (degenerative joint disease)    knees   Dysrhythmia    Eczema    Erectile dysfunction    Gout    History of hiatal hernia    History of radiation therapy    Right lung- 04/21/21-05/03/21 Dr. Antony Blackbird   HTN (hypertension)    MVP (mitral valve prolapse)    Shortness of breath dyspnea    with activity    Allergies No Known Allergies  IV Location/Drains/Wounds Patient Lines/Drains/Airways Status     Active Line/Drains/Airways      Name Placement date Placement time Site Days   Peripheral IV 09/19/23 20 G Anterior;Right Forearm 09/19/23  1320  Forearm  less than 1   Urethral Catheter Alfred RN 16 Fr. 09/18/22  2251  --  366   Wound / Incision (Open or Dehisced) 12/01/22 Puncture Wrist Anterior;Left 12/01/22  1507  Wrist  292            Labs/Imaging Results for orders placed or performed during the hospital encounter of 09/19/23 (from the past 48 hours)  CBC with Differential     Status: Abnormal   Collection Time: 09/19/23  1:16 PM  Result Value Ref Range   WBC 8.8 4.0 - 10.5 K/uL   RBC 2.63 (L) 4.22 - 5.81 MIL/uL   Hemoglobin 8.9 (L) 13.0 - 17.0 g/dL   HCT 53.6 (L) 64.4 - 03.4 %   MCV 106.8 (H) 80.0 - 100.0 fL   MCH 33.8 26.0 - 34.0 pg   MCHC 31.7 30.0 - 36.0 g/dL   RDW 74.2 59.5 - 63.8 %   Platelets 196 150 - 400 K/uL   nRBC 0.2 0.0 - 0.2 %   Neutrophils Relative % 91 %   Neutro Abs 8.0 (H) 1.7 -  7.7 K/uL   Lymphocytes Relative 5 %   Lymphs Abs 0.4 (L) 0.7 - 4.0 K/uL   Monocytes Relative 3 %   Monocytes Absolute 0.2 0.1 - 1.0 K/uL   Eosinophils Relative 0 %   Eosinophils Absolute 0.0 0.0 - 0.5 K/uL   Basophils Relative 0 %   Basophils Absolute 0.0 0.0 - 0.1 K/uL   Immature Granulocytes 1 %   Abs Immature Granulocytes 0.05 0.00 - 0.07 K/uL    Comment: Performed at [facility], [address], [place], [place] [address]  Comprehensive metabolic panel     Status: Abnormal   Collection Time: 09/19/23  1:16 PM  Result Value Ref Range   Sodium 139 135 - 145 mmol/L   Potassium 3.3 (L) 3.5 - 5.1 mmol/L   Chloride 101 98 - 111 mmol/L   CO2 27 22 - 32 mmol/L   Glucose, Bld 108 (H) 70 - 99 mg/dL    Comment: Glucose reference range applies only to samples taken after fasting for at least 8 hours.   BUN 23 8 - 23 mg/dL   Creatinine, Ser 8.29 0.61 - 1.24 mg/dL   Calcium 8.9 8.9 - 56.2 mg/dL   Total Protein 6.3 (L) 6.5 - 8.1 g/dL   Albumin 3.9 3.5 - 5.0 g/dL   AST 22 15 - 41 U/L    ALT 18 0 - 44 U/L   Alkaline Phosphatase 43 38 - 126 U/L   Total Bilirubin 0.7 0.0 - 1.2 mg/dL   GFR, Estimated >13 >08 mL/min    Comment: (NOTE) Calculated using the CKD-EPI Creatinine Equation (2021)    Anion gap 11 5 - 15    Comment: Performed at [facility], [address] [place], [place] [address]  Protime-INR     Status: Abnormal   Collection Time: 09/19/23  1:16 PM  Result Value Ref Range   Prothrombin Time 17.1 (H) 11.4 - 15.2 seconds   INR 1.4 (H) 0.8 - 1.2    Comment: (NOTE) INR goal varies based on device and disease states. Performed at [facility], [address] [place], [place] [address]   APTT     Status: None   Collection Time: 09/19/23  1:16 PM  Result Value Ref Range   aPTT 27 24 - 36 seconds    Comment: Performed at [facility], [address], [place], [place] [address]  Type and screen [facility]     Status: None   Collection Time: 09/19/23  1:16 PM  Result Value Ref Range   ABO/RH(D) O POS    Antibody Screen NEG    Sample Expiration      09/22/2023,2359 Performed at [facility], [address] [place], [place] [address]   POC occult blood, ED     Status: Abnormal   Collection Time: 09/19/23  1:40 PM  Result Value Ref Range   Fecal Occult Bld POSITIVE (A) NEGATIVE  Reticulocytes     Status: Abnormal   Collection Time: 09/19/23  6:30 PM  Result Value Ref Range   Retic Ct Pct 6.2 (H) 0.4 - 3.1 %   RBC. 2.59 (L) 4.22 - 5.81 MIL/uL   Retic Count, Absolute 160.3 19.0 - 186.0 K/uL   Immature Retic Fract 26.8 (H) 2.3 - 15.9 %    Comment: Performed at [facility], [address] [place], [place] [address]  Hemoglobin and hematocrit, blood  Status: Abnormal   Collection Time: 09/19/23  6:30 PM  Result Value Ref Range   Hemoglobin 8.6 (L) 13.0 - 17.0 g/dL   HCT 01.0 (L) 93.2 - 35.5 %    Comment: Performed at [facility], [address] [place], [place] [address]   DG Chest 2 View Result Date: 09/19/2023 CLINICAL DATA:  Dyspnea, abnormal laboratory evaluation, dizziness, dark stools, history of lung cancer EXAM: CHEST - 2 VIEW COMPARISON:  01/24/2023 FINDINGS: Frontal and lateral views of the chest demonstrates postsurgical changes from median sternotomy and aortic valve replacement. Stable enlargement of the cardiac silhouette. Stable atherosclerosis of the thoracic aorta. Stable small bilateral pleural effusions. Stable postsurgical and post therapeutic changes at the right apex. Background emphysema. No airspace disease or pneumothorax. No acute bony abnormalities. IMPRESSION: 1. Stable enlarged cardiac silhouette. 2. Chronic small bilateral pleural effusions. 3. Stable postsurgical and post therapeutic changes at the right apex consistent with known history of prior lung cancer. 4. Emphysema.  No acute airspace disease. Electronically Signed   By: Sharlet Salina M.D.   On: 09/19/2023 18:53    Pending Labs Unresulted Labs (From admission, onward)     Start     Ordered   09/20/23 0500  Protime-INR  Tomorrow morning,   R        09/19/23 1832   09/19/23 1752  Vitamin B12  (Anemia Panel (PNL))  Once,   URGENT        09/19/23 1751   09/19/23 1752  Folate  (Anemia Panel (PNL))  Once,   URGENT        09/19/23 1751   09/19/23 1752  Iron and TIBC  (Anemia Panel (PNL))  Once,   URGENT        09/19/23 1751   09/19/23 1752  Ferritin  (Anemia Panel (PNL))  Once,   URGENT        09/19/23 1751   Signed and Held  CBC  Tomorrow morning,   R        Signed and Held   Signed and Held  Comprehensive metabolic panel  Tomorrow morning,   R        Signed and Held            Vitals/Pain Today's Vitals   09/19/23 1645 09/19/23 1715 09/19/23 1745 09/19/23 1800  BP: (!) 156/86 (!) 165/79 (!) 157/69 136/85  Pulse: 71 82 77 78  Resp:    14  Temp:    98.6 F (37 C)  TempSrc:    Oral  SpO2: 100% 100%  100% 99%  Weight:      Height:      PainSc:        Isolation Precautions No active isolations  Medications Medications  pantoprazole (PROTONIX) injection 40 mg (has no administration in time range)  lactated ringers infusion (has no administration in time range)  pantoprazole (PROTONIX) injection 40 mg (40 mg Intravenous Given 09/19/23 1615)  potassium chloride SA (KLOR-CON M) CR tablet 40 mEq (40 mEq Oral Given 09/19/23 1826)    Mobility walks

## 2023-09-19 NOTE — Telephone Encounter (Signed)
Patient has arrived at the ED.

## 2023-09-19 NOTE — ED Triage Notes (Signed)
Pt referred to the ER by his PCP. Pt had a recent operation and reports losing a lot of blood. Yesterday pt had blood drawn at his PCP and received a call today saying that he needed a blood transfusion. Pt also endorses dark stools, and dizziness.

## 2023-09-19 NOTE — Telephone Encounter (Signed)
Spoke with patient and reviewed lab results.  Per Jared Frees NP the patient should go to the ED.  Patient verbally agreed.  Awaiting patient's arrival.

## 2023-09-19 NOTE — ED Provider Notes (Signed)
[place] [facility] DEPARTMENT AT [facility] Provider Note   CSN: [id number] Arrival date & time: 09/19/23  1144     History  Chief Complaint  Patient presents with   Abnormal Lab    DAYVON DAX is a 88 y.o. male.  Patient with history of A-fib on Coumadin, hypertension, CHF presents today with concern for abnormal lab.  He states that 5 days ago he had a dental procedure to remove several teeth.  Following this he had an extended interval of bleeding from the site.  He has also developed weakness and lightheadedness.  He went to his primary care doctor yesterday and had his hemoglobin checked and was found to be 8.9.  Sent here for evaluation of same. Also notes that he has had a few episodes of melanous stool.  Denies history of similar.  States this started on Saturday.  Denies fevers, chills, nausea, vomiting, diarrhea, or abdominal pain.  No urinary symptoms.  The history is provided by the patient. No language interpreter was used.  Abnormal Lab      Home Medications Prior to Admission medications   Medication Sig Start Date End Date Taking? Authorizing Provider  allopurinol (ZYLOPRIM) 300 MG tablet Take 1 tablet by mouth once daily 04/24/23   Philip Aspen, Limmie Patricia, MD  amLODipine (NORVASC) 5 MG tablet Take 1 tablet by mouth once daily 04/24/23   Philip Aspen, Limmie Patricia, MD  furosemide (LASIX) 20 MG tablet Take 3 tablets (60 mg total) by mouth daily. 06/28/23   Lewayne Bunting, MD  potassium chloride SA (KLOR-CON M) 20 MEQ tablet Take 1 tablet (20 mEq total) by mouth daily. 07/11/23   Lewayne Bunting, MD  predniSONE (DELTASONE) 20 MG tablet Take 1 tablet by mouth once daily with breakfast 07/09/23   Philip Aspen, Limmie Patricia, MD  tamsulosin (FLOMAX) 0.4 MG CAPS capsule TAKE 1 CAPSULE BY MOUTH ONCE DAILY AFTER BREAKFAST 12/25/22   Philip Aspen, Limmie Patricia, MD  traMADol (ULTRAM) 50 MG tablet TAKE 1 TABLET BY MOUTH EVERY 12 HOURS AS NEEDED 09/10/23    Philip Aspen, Limmie Patricia, MD  traZODone (DESYREL) 50 MG tablet TAKE 1 TABLET BY MOUTH AT BEDTIME AS NEEDED 07/10/23   Philip Aspen, Limmie Patricia, MD  triamcinolone cream (KENALOG) 0.1 % APPLY  CREAM EXTERNALLY TO AFFECTED AREA TWICE DAILY 05/22/22   Philip Aspen, Limmie Patricia, MD  VITAMIN D PO Take 1 tablet by mouth daily.    [provider]  warfarin (COUMADIN) 5 MG tablet TAKE 1/2 TABLET BY MOUTH  DAILY EXCEPT TAKE 1 TABLET ON WEDNESDAY OR AS  DIRECTED  BY  ANTICOAGULATION  CLINIC 07/06/23   Philip Aspen, Limmie Patricia, MD      Allergies    Patient has no known allergies.    Review of Systems   Review of Systems  Gastrointestinal:  Positive for blood in stool.  All other systems reviewed and are negative.   Physical Exam Updated Vital Signs BP (!) 148/75 (BP Location: Left Arm)   Pulse 80   Temp 98 F (36.7 C) (Oral)   Resp 14   Ht 5\' 6"  (1.676 m)   Wt 72.6 kg   SpO2 100%   BMI 25.82 kg/m  Physical Exam Vitals and nursing note reviewed. Exam conducted with a chaperone present.  Constitutional:      General: He is not in acute distress.    Appearance: Normal appearance. He is normal weight. He is not ill-appearing, toxic-appearing or  diaphoretic.  HENT:     Head: Normocephalic and atraumatic.     Mouth/Throat:     Comments: No bleeding in the mouth, dental wounds well healing. Cardiovascular:     Rate and Rhythm: Normal rate.  Pulmonary:     Effort: Pulmonary effort is normal. No respiratory distress.  Abdominal:     General: Abdomen is flat.     Palpations: Abdomen is soft.     Tenderness: There is no abdominal tenderness.  Genitourinary:    Comments: Grossly melanotic stool present on exam Musculoskeletal:        General: Normal range of motion.     Cervical back: Normal range of motion.  Skin:    General: Skin is warm and dry.  Neurological:     General: No focal deficit present.     Mental Status: He is alert.  Psychiatric:        Mood and Affect:  Mood normal.        Behavior: Behavior normal.     ED Results / Procedures / Treatments   Labs (all labs ordered are listed, but only abnormal results are displayed) Labs Reviewed  CBC WITH DIFFERENTIAL/PLATELET - Abnormal; Notable for the following components:      Result Value   RBC 2.63 (*)    Hemoglobin 8.9 (*)    HCT 28.1 (*)    MCV 106.8 (*)    Neutro Abs 8.0 (*)    Lymphs Abs 0.4 (*)    All other components within normal limits  COMPREHENSIVE METABOLIC PANEL - Abnormal; Notable for the following components:   Potassium 3.3 (*)    Glucose, Bld 108 (*)    Total Protein 6.3 (*)    All other components within normal limits  PROTIME-INR - Abnormal; Notable for the following components:   Prothrombin Time 17.1 (*)    INR 1.4 (*)    All other components within normal limits  POC OCCULT BLOOD, ED - Abnormal; Notable for the following components:   Fecal Occult Bld POSITIVE (*)    All other components within normal limits  APTT  VITAMIN B12  FOLATE  IRON AND TIBC  FERRITIN  RETICULOCYTES  TYPE AND SCREEN    EKG None  Radiology No results found.  Procedures Procedures    Medications Ordered in ED Medications - No data to display  ED Course/ Medical Decision Making/ A&P                                 Medical Decision Making Amount and/or Complexity of Data Reviewed Labs: ordered.  Risk Prescription drug management. Decision regarding hospitalization.   This patient is a 88 y.o. male who presents to the ED for concern of abnormal lab, melena, this involves an extensive number of treatment options, and is a complaint that carries with it a high risk of complications and morbidity. The emergent differential diagnosis prior to evaluation includes, but is not limited to,  symptomatic anemia, acute GI bleed, anemia from dental proocedure . This is not an exhaustive differential.   Past Medical History / Co-morbidities / Social History:  has a past medical  history of Aortic stenosis, Atrial fibrillation (HCC), Baker cyst, DJD (degenerative joint disease), Dysrhythmia, Eczema, Erectile dysfunction, Gout, History of hiatal hernia, History of radiation therapy, HTN (hypertension), MVP (mitral valve prolapse), and Shortness of breath dyspnea.  Additional history: Chart reviewed. Pertinent results include: hgb 8.9 at  pcp yesterday  Physical Exam: Physical exam performed. The pertinent findings include: overall well appearing, melanous stool on exam.  No active intraoral bleeding  Lab Tests: I ordered, and personally interpreted labs.  The pertinent results include:  hgb 8.9 same as yesterday. K 3.3, INR 1.4, Hemoccult positive  Cardiac Monitoring:  The patient was maintained on a cardiac monitor.  Cardiac monitor showed an underlying rhythm of: afib. I agree with this interpretation.   Medications: I ordered medication including protonix  for GI bleed. Reevaluation of the patient after these medicines showed that the patient stayed the same. I have reviewed the patients home medicines and have made adjustments as needed.  Consultations Obtained: I requested consultation with the Gi on call Dr. Lavon Paganini,  and discussed lab and imaging findings as well as pertinent plan - they recommend: admit to medicine, they will see the patient.  Plan for clear liquid diet tonight, n.p.o. at midnight and endoscopy tomorrow.   Disposition: After consideration of the diagnostic results and the patients response to treatment, I feel that patient will require admission for GI bleed anticoagulation.  Discussed with patient is understanding and agreement with this.  Discussed patient with hospitalist who accepts patient for admission.  I discussed this case with my attending physician Dr. Wilkie Aye who cosigned this note including patient's presenting symptoms, physical exam, and planned diagnostics and interventions. Attending physician stated agreement with plan or made  changes to plan which were implemented.    Final Clinical Impression(s) / ED Diagnoses Final diagnoses:  Gastrointestinal hemorrhage, unspecified gastrointestinal hemorrhage type  Symptomatic anemia    Rx / DC Orders ED Discharge Orders     None         Vear Clock 09/19/23 1920    Rozelle Logan, DO 09/21/23 [phone]

## 2023-09-19 NOTE — H&P (Addendum)
History and Physical    Patient: [name] JYN:[id number] DOB: 09/07/1935 DOA: 09/19/2023 DOS: the patient was seen and examined on 09/19/2023 PCP: Philip Aspen, Limmie Patricia, MD  Patient coming from: Home  Chief Complaint:  Chief Complaint  Patient presents with   Abnormal Lab   HPI: [name] is a 88 y.o. male with medical history significant of aortic stenosis, A-fib on Coumadin, gout, hypertension, mitral valve prolapse, aortic valve replacement, with a pericardial tissue valve, presenting referred by PCP due to anemia drop in hemoglobin from 12-8.9.  Patient had dental procedure 5 days prior to admission, he had 3 cracked teeth removed.  He bled a bit over 24 hours postprocedure.  He reports dark stool,that started Saturday (5 days ago) .  He was feeling dizzy and weak.  Reports he takes ibuprofen 2 tablets in the morning and 2 tablets at night, has been taking ibuprofen for 6 years.  He also take daily dose of prednisone for arthritis. He denies abdominal pain. No diarrhea.   Evaluation in the ED; sodium 139, potassium 3.3, glucose 108, BUN 23, creatinine 1.06, normal liver function test, hemoglobin 8.9, INR 1.4 occult blood positive on rectal exam and melena was found by ED provider.       Review of Systems: As mentioned in the history of present illness. All other systems reviewed and are negative. Past Medical History:  Diagnosis Date   Aortic stenosis    Atrial fibrillation (HCC)    Baker cyst    Left knee   DJD (degenerative joint disease)    knees   Dysrhythmia    Eczema    Erectile dysfunction    Gout    History of hiatal hernia    History of radiation therapy    Right lung- 04/21/21-05/03/21 Dr. Antony Blackbird   HTN (hypertension)    MVP (mitral valve prolapse)    Shortness of breath dyspnea    with activity   Past Surgical History:  Procedure Laterality Date    polyps vocal cord  1994   AORTIC VALVE REPLACEMENT  ~2004   BRONCHIAL BIOPSY  03/07/2021    Procedure: BRONCHIAL BIOPSIES;  Surgeon: Leslye Peer, MD;  Location: [facility] ENDOSCOPY;  Service: Pulmonary;;   BRONCHIAL BIOPSY  11/14/2021   Procedure: BRONCHIAL BIOPSIES;  Surgeon: Leslye Peer, MD;  Location: [facility] ENDOSCOPY;  Service: Pulmonary;;   BRONCHIAL BRUSHINGS  03/07/2021   Procedure: BRONCHIAL BRUSHINGS;  Surgeon: Leslye Peer, MD;  Location: [facility] ENDOSCOPY;  Service: Pulmonary;;   BRONCHIAL BRUSHINGS  11/14/2021   Procedure: BRONCHIAL BRUSHINGS;  Surgeon: Leslye Peer, MD;  Location: [facility] ENDOSCOPY;  Service: Pulmonary;;   BRONCHIAL NEEDLE ASPIRATION BIOPSY  03/07/2021   Procedure: BRONCHIAL NEEDLE ASPIRATION BIOPSIES;  Surgeon: Leslye Peer, MD;  Location: [facility] ENDOSCOPY;  Service: Pulmonary;;   BRONCHIAL NEEDLE ASPIRATION BIOPSY  11/14/2021   Procedure: BRONCHIAL NEEDLE ASPIRATION BIOPSIES;  Surgeon: Leslye Peer, MD;  Location: [facility] ENDOSCOPY;  Service: Pulmonary;;   BRONCHIAL WASHINGS  09/21/2020   Procedure: BRONCHIAL WASHINGS;  Surgeon: Martina Sinner, MD;  Location: [facility] ENDOSCOPY;  Service: Pulmonary;;  BAL    BRONCHIAL WASHINGS  03/07/2021   Procedure: BRONCHIAL WASHINGS;  Surgeon: Leslye Peer, MD;  Location: [facility] ENDOSCOPY;  Service: Pulmonary;;   BRONCHIAL WASHINGS  11/14/2021   Procedure: BRONCHIAL WASHINGS;  Surgeon: Leslye Peer, MD;  Location: [facility] ENDOSCOPY;  Service: Pulmonary;;   FIDUCIAL MARKER PLACEMENT  03/07/2021   Procedure: FIDUCIAL MARKER PLACEMENT;  Surgeon: Leslye Peer, MD;  Location: [facility] ENDOSCOPY;  Service: Pulmonary;;   HERNIA REPAIR  [phone]   HERNIA REPAIR  [phone]   IR ANGIOGRAM PELVIS SELECTIVE OR SUPRASELECTIVE  12/01/2022   IR ANGIOGRAM PELVIS SELECTIVE OR SUPRASELECTIVE  12/01/2022   IR ANGIOGRAM SELECTIVE EACH ADDITIONAL VESSEL  12/01/2022   IR ANGIOGRAM SELECTIVE EACH ADDITIONAL VESSEL  12/01/2022   IR CT SPINE LTD  12/01/2022   IR CT SPINE LTD  12/01/2022   IR CT SPINE LTD  12/01/2022   IR EMBO ARTERIAL NOT HEMORR HEMANG INC GUIDE ROADMAPPING   12/01/2022   IR EMBO TUMOR ORGAN ISCHEMIA INFARCT INC GUIDE ROADMAPPING  12/01/2022   IR RADIOLOGIST EVAL & MGMT  11/17/2022   IR US GUIDE VASC ACCESS LEFT  12/01/2022   IR US GUIDE VASC ACCESS RIGHT  12/01/2022   LUMBAR LAMINECTOMY/DECOMPRESSION MICRODISCECTOMY N/A 01/15/2015   Procedure: 2 LEVEL DECOMPRESSIVE LUMBAR LAMINECTOMY L3-L4,L4-L5;  Surgeon: Ranee Gosselin, MD;  Location: [facility] ORS;  Service: Orthopedics;  Laterality: N/A;   LUMBAR LAMINECTOMY/DECOMPRESSION MICRODISCECTOMY N/A 05/01/2017   Procedure: Lumbar one-Lumbar two, Lumbar two-Lumbar three, Lumbar three-Lumbar four Laminectomy; Evacuation of hematoma;  Surgeon: Lisbeth Renshaw, MD;  Location[facility] OR;  Service: Neurosurgery;  Laterality: N/A;   right total hip  ~4 years ago   ROTATOR CUFF REPAIR Left ~2009   TOTAL KNEE ARTHROPLASTY Left 04/14/2015   Procedure: LEFT TOTAL KNEE ARTHROPLASTY;  Surgeon: Ranee Gosselin, MD;  Location: [facility] ORS;  Service: Orthopedics;  Laterality: Left;   VIDEO BRONCHOSCOPY N/A 09/21/2020   Procedure: VIDEO BRONCHOSCOPY WITHOUT FLUORO;  Surgeon: Martina Sinner, MD;  Location: [facility] ENDOSCOPY;  Service: Pulmonary;  Laterality: N/A;   VIDEO BRONCHOSCOPY WITH ENDOBRONCHIAL NAVIGATION Right 03/07/2021   Procedure: VIDEO BRONCHOSCOPY WITH ENDOBRONCHIAL NAVIGATION;  Surgeon: Leslye Peer, MD;  Location: [facility] ENDOSCOPY;  Service: Pulmonary;  Laterality: Right;   VIDEO BRONCHOSCOPY WITH RADIAL ENDOBRONCHIAL ULTRASOUND  11/14/2021   Procedure: VIDEO BRONCHOSCOPY WITH RADIAL ENDOBRONCHIAL ULTRASOUND;  Surgeon: Leslye Peer, MD;  Location: [facility] ENDOSCOPY;  Service: Pulmonary;;   Social History:  reports that he quit smoking about 21 years ago. His smoking use included cigarettes. He started smoking about 51 years ago. He has never used smokeless tobacco. He reports current alcohol use of about 3.0 standard drinks of alcohol per week. He reports that he does not use drugs.  No Known Allergies  Family History  Problem  Relation Age of Onset   Diabetes Other    Stroke Other     Prior to Admission medications   Medication Sig Start Date End Date Taking? Authorizing Provider  allopurinol (ZYLOPRIM) 300 MG tablet Take 1 tablet by mouth once daily Patient taking differently: Take 300 mg by mouth in the morning. 04/24/23  Yes Philip Aspen, Limmie Patricia, MD  amLODipine (NORVASC) 5 MG tablet Take 1 tablet by mouth once daily Patient taking differently: Take 5 mg by mouth in the morning. 04/24/23  Yes Philip Aspen, Limmie Patricia, MD  furosemide (LASIX) 20 MG tablet Take 3 tablets (60 mg total) by mouth daily. Patient taking differently: Take 60 mg by mouth in the morning. 06/28/23  Yes Lewayne Bunting, MD  ibuprofen (ADVIL) 200 MG tablet Take 400 mg by mouth in the morning and at bedtime.   Yes [provider]  potassium chloride SA (KLOR-CON M) 20 MEQ tablet Take 1 tablet (20 mEq total) by mouth daily. Patient taking differently: Take 20 mEq by mouth in the morning. 07/11/23  Yes  Lewayne Bunting, MD  predniSONE (DELTASONE) 20 MG tablet Take 1 tablet by mouth once daily with breakfast 07/09/23  Yes Philip Aspen, Limmie Patricia, MD  tamsulosin (FLOMAX) 0.4 MG CAPS capsule TAKE 1 CAPSULE BY MOUTH ONCE DAILY AFTER BREAKFAST Patient taking differently: Take 0.4 mg by mouth daily after breakfast. 12/25/22  Yes Philip Aspen, Limmie Patricia, MD  traMADol (ULTRAM) 50 MG tablet TAKE 1 TABLET BY MOUTH EVERY 12 HOURS AS NEEDED 09/10/23  Yes Philip Aspen, Limmie Patricia, MD  traZODone (DESYREL) 50 MG tablet TAKE 1 TABLET BY MOUTH AT BEDTIME AS NEEDED 07/10/23  Yes Philip Aspen, Limmie Patricia, MD  triamcinolone cream (KENALOG) 0.1 % APPLY  CREAM EXTERNALLY TO AFFECTED AREA TWICE DAILY Patient taking differently: Apply 1 Application topically 2 (two) times daily as needed. 05/22/22  Yes Philip Aspen, Limmie Patricia, MD  VITAMIN D PO Take 1 tablet by mouth in the morning.   Yes [provider]  warfarin (COUMADIN) 5 MG  tablet TAKE 1/2 TABLET BY MOUTH  DAILY EXCEPT TAKE 1 TABLET ON WEDNESDAY OR AS  DIRECTED  BY  ANTICOAGULATION  CLINIC Patient taking differently: Take 2.5-5 mg by mouth See admin instructions. Take 0.5 tablet every morning  except take 1 tablet on Wednesday or as directed by anticoagulation clinic. 07/06/23  Yes Philip Aspen, Limmie Patricia, MD    Physical Exam: Vitals:   09/19/23 1157 09/19/23 1500  BP: (!) 148/75 (!) 133/109  Pulse: 80 77  Resp: 14 17  Temp: 98 F (36.7 C) 98.3 F (36.8 C)  TempSrc: Oral Oral  SpO2: 100% 93%  Weight: 72.6 kg   Height: 5\' 6"  (1.676 m)    General; NAD CVS; S1, S 2 RRR Lungs CTA, Normal respiratory effort.  Abdomen; BS present soft, nt Extremities; no edema.  Neuro: alert, non focal.    Data Reviewed:  Labs reviewed  Assessment and Plan: No notes have been filed under this hospital service. Service: Hospitalist  1-Acute blood loss anemia, Melena Present with a hemoglobin of 8.9, hemoglobin 2 months ago was around 12. -Check anemia panel. -IV Protonix IV, BID./  -[facility] has been consulted. Plan for liquid diet, NPO after midnight.  -Check hemoglobin tonight -We discussed about stopping ibuprofen  History of A-fib on Coumadin -Plan to hold Coumadin tonight for procedure tomorrow and in the setting of GI bleed -He took Coumadin this morning.  INR 1.4.  Repeat INR in the morning -Plan to hold anticoagulation due to active GI bleed -will defer to Cardiology use of eliquis.   History of aortic valve replacement, pericardial tissue valve -History of chronic diastolic heart failure -Follow-up with Dr. Jens Som -Will hold Lasix tomorrow in anticipation of endoscopy  Hypertension: -Will continue with Norvasc  Hypokalemia: -Replete orally  Arthritis: Plan to hold ibuprofen in the setting of GI bleed Will continue prednisone, he has been taking prednisone for more than 20 years.  If he is found to have an ulcer might need to have dose  reduce  slowly   Advance Care Planning:   Code Status: Prior   Consults: [facility]  Family Communication:   Severity of Illness: The appropriate patient status for this patient is OBSERVATION. Observation status is judged to be reasonable and necessary in order to provide the required intensity of service to ensure the patient's safety. The patient's presenting symptoms, physical exam findings, and initial radiographic and laboratory data in the context of their medical condition is felt to place them at decreased risk  for further clinical deterioration. Furthermore, it is anticipated that the patient will be medically stable for discharge from the hospital within 2 midnights of admission.   Author: Alba Cory, MD 09/19/2023 5:43 PM  For on call review [email or web address].

## 2023-09-20 DIAGNOSIS — I482 Chronic atrial fibrillation, unspecified: Secondary | ICD-10-CM | POA: Diagnosis not present

## 2023-09-20 DIAGNOSIS — Z7901 Long term (current) use of anticoagulants: Secondary | ICD-10-CM

## 2023-09-20 DIAGNOSIS — K922 Gastrointestinal hemorrhage, unspecified: Secondary | ICD-10-CM | POA: Diagnosis not present

## 2023-09-20 DIAGNOSIS — D62 Acute posthemorrhagic anemia: Secondary | ICD-10-CM

## 2023-09-20 DIAGNOSIS — Z952 Presence of prosthetic heart valve: Secondary | ICD-10-CM

## 2023-09-20 DIAGNOSIS — Z5181 Encounter for therapeutic drug level monitoring: Secondary | ICD-10-CM | POA: Diagnosis not present

## 2023-09-20 DIAGNOSIS — K921 Melena: Secondary | ICD-10-CM | POA: Diagnosis not present

## 2023-09-20 DIAGNOSIS — D649 Anemia, unspecified: Secondary | ICD-10-CM

## 2023-09-20 LAB — COMPREHENSIVE METABOLIC PANEL
ALT: 15 U/L (ref 0–44)
AST: 16 U/L (ref 15–41)
Albumin: 3.2 g/dL — ABNORMAL LOW (ref 3.5–5.0)
Alkaline Phosphatase: 36 U/L — ABNORMAL LOW (ref 38–126)
Anion gap: 6 (ref 5–15)
BUN: 18 mg/dL (ref 8–23)
CO2: 30 mmol/L (ref 22–32)
Calcium: 8.5 mg/dL — ABNORMAL LOW (ref 8.9–10.3)
Chloride: 103 mmol/L (ref 98–111)
Creatinine, Ser: 1.19 mg/dL (ref 0.61–1.24)
GFR, Estimated: 60 mL/min — ABNORMAL LOW (ref >60)
Glucose, Bld: 92 mg/dL (ref 70–99)
Potassium: 4.1 mmol/L (ref 3.5–5.1)
Sodium: 139 mmol/L (ref 135–145)
Total Bilirubin: 0.7 mg/dL (ref 0.0–1.2)
Total Protein: 5.4 g/dL — ABNORMAL LOW (ref 6.5–8.1)

## 2023-09-20 LAB — CBC
HCT: 23.7 % — ABNORMAL LOW (ref 39.0–52.0)
Hemoglobin: 7.7 g/dL — ABNORMAL LOW (ref 13.0–17.0)
MCH: 34.4 pg — ABNORMAL HIGH (ref 26.0–34.0)
MCHC: 32.5 g/dL (ref 30.0–36.0)
MCV: 105.8 fL — ABNORMAL HIGH (ref 80.0–100.0)
Platelets: 172 10*3/uL (ref 150–400)
RBC: 2.24 MIL/uL — ABNORMAL LOW (ref 4.22–5.81)
RDW: 15.3 % (ref 11.5–15.5)
WBC: 5.8 10*3/uL (ref 4.0–10.5)
nRBC: 0 % (ref 0.0–0.2)

## 2023-09-20 LAB — PROTIME-INR
INR: 1.7 — ABNORMAL HIGH (ref 0.8–1.2)
Prothrombin Time: 19.9 s — ABNORMAL HIGH (ref 11.4–15.2)

## 2023-09-20 LAB — PREPARE RBC (CROSSMATCH)

## 2023-09-20 MED ORDER — FUROSEMIDE 10 MG/ML IJ SOLN
20.0000 mg | Freq: Once | INTRAMUSCULAR | Status: AC
  Administered 2023-09-20: 20 mg via INTRAVENOUS
  Filled 2023-09-20: qty 2

## 2023-09-20 MED ORDER — SODIUM CHLORIDE 0.9% IV SOLUTION: Freq: Once | INTRAVENOUS | Status: AC

## 2023-09-20 MED ORDER — VITAMIN B-12 1000 MCG PO TABS
500.0000 ug | ORAL_TABLET | Freq: Every day | ORAL | Status: DC
  Administered 2023-09-20 – 2023-09-21 (×2): 500 ug via ORAL
  Filled 2023-09-20 (×2): qty 1

## 2023-09-20 NOTE — Progress Notes (Signed)
PROGRESS NOTE    [name]  NGE:[id number] DOB: 09-28-1935 DOA: 09/19/2023 PCP: Philip Aspen, Limmie Patricia, MD   Brief Narrative: 88 y.o. male with medical history significant of aortic stenosis, A-fib on Coumadin, gout, hypertension, mitral valve prolapse, aortic valve replacement, with a pericardial tissue valve, presenting referred by PCP due to anemia drop in hemoglobin from 12-8.9.  Patient had dental procedure 5 days prior to admission, he had 3 cracked teeth removed.  He bled a bit over 24 hours postprocedure.  He reports dark stool,that started Saturday (5 days ago) .  He was feeling dizzy and weak.     Admitted for acute blood loss anemia and Melena. Took coumadin 1/22 AM. He held coumadin 3 days post dental extraction as instructed.    Assessment & Plan:   Principal Problem:   GI bleed  1-Acute blood loss anemia, Melena:  Present with a hemoglobin of 8.9, hemoglobin 2 months ago was around 12. -Anemia panel: consistent with iron deficiency. Low normal B 12 start supplement.  -IV Protonix IV, BID./  -[facility] has been consulted. Currently NPO -Hb down to 7,6--will proceed with one unit PRBC.    History of A-fib on Coumadin -He took Coumadin 1/22 AM. INR today at 1.7 -Plan to hold anticoagulation due to active GI bleed -Will consult Cardiology to assist with management, risk benefit. Patient is concern that he is not on anticoagulation. Also would patient be candidate for Eliquis at some point.    History of aortic valve replacement, pericardial tissue valve -History of chronic diastolic heart failure -Follow-up with Dr. Jens Som -Resume lasix post endoscopy  Hypertension: -Continue with Norvasc   Hypokalemia: -Replete orally   Arthritis: Plan to hold ibuprofen in the setting of GI bleed Will continue prednisone, he has been taking prednisone for more than 20 years.  If he is found to have an ulcer might need to have dose reduce  slowly    Estimated body  mass index is 25.82 kg/m as calculated from the following:   Height as of this encounter: 5\' 6"  (1.676 m).   Weight as of this encounter: 72.6 kg.   DVT prophylaxis: SCD Code Status: Full Code Family Communication: Disposition Plan:  Status is: Observation The patient remains OBS appropriate and will d/c before 2 midnights.    Consultants:  [facility]   Procedures:  None  Antimicrobials:  None  Subjective: He is alert, answer question, no BM since yesterday morning. He has not been eating. He was told maybe they wont be able to do endoscopy until 5 days of coumadin wash off. He is concern about been off anticoagulation, he has been in the past on lovenox , I explained to him lovenox would increase risk for bleeding as well.    Objective: Vitals:   09/19/23 1800 09/19/23 1946 09/19/23 2346 09/20/23 0512  BP: 136/85 (!) 176/68 (!) 121/58 (!) 156/66  Pulse: 78 79 69 79  Resp: 14 18  17   Temp: 98.6 F (37 C) (!) 97.5 F (36.4 C) 98.1 F (36.7 C) 98.1 F (36.7 C)  TempSrc: Oral Oral Oral Oral  SpO2: 99% 100% 98% 98%  Weight:      Height:        Intake/Output Summary (Last 24 hours) at 09/20/2023 0803 Last data filed at 09/20/2023 0523 Gross per 24 hour  Intake 250 ml  Output 650 ml  Net -400 ml   Filed Weights   09/19/23 1157  Weight: 72.6 kg  Examination:  General exam:NAD Respiratory system: Clear to auscultation. Respiratory effort normal. Cardiovascular system: S1 & S2 heard, RRR. No JVD, murmurs, rubs, gallops or clicks. No pedal edema. Gastrointestinal system: Abdomen is nondistended, soft and nontender. No organomegaly or masses felt. Normal bowel sounds heard. Central nervous system: Alert and oriented. No focal neurological deficits. Extremities: Symmetric 5 x 5 power.    Data Reviewed: I have personally reviewed following labs and imaging studies  CBC: Recent Labs  Lab 09/18/23 1607 09/19/23 1316 09/19/23 1830 09/20/23 0501   WBC 6.4 8.8  --  5.8  NEUTROABS 5.8 8.0*  --   --   HGB 8.9 Repeated and verified X2.* 8.9* 8.6* 7.7*  HCT 26.8* 28.1* 27.0* 23.7*  MCV 104.0* 106.8*  --  105.8*  PLT 196.0 196  --  172   Basic Metabolic Panel: Recent Labs  Lab 09/19/23 1316 09/20/23 0501  NA 139 139  K 3.3* 4.1  CL 101 103  CO2 27 30  GLUCOSE 108* 92  BUN 23 18  CREATININE 1.06 1.19  CALCIUM 8.9 8.5*   GFR: Estimated Creatinine Clearance: 41 mL/min (by C-G formula based on SCr of 1.19 mg/dL). Liver Function Tests: Recent Labs  Lab 09/19/23 1316 09/20/23 0501  AST 22 16  ALT 18 15  ALKPHOS 43 36*  BILITOT 0.7 0.7  PROT 6.3* 5.4*  ALBUMIN 3.9 3.2*   No results for input(s): "LIPASE", "AMYLASE" in the last 168 hours. No results for input(s): "AMMONIA" in the last 168 hours. Coagulation Profile: Recent Labs  Lab 09/19/23 1316 09/20/23 0501  INR 1.4* 1.7*   Cardiac Enzymes: No results for input(s): "CKTOTAL", "CKMB", "CKMBINDEX", "TROPONINI" in the last 168 hours. BNP (last 3 results) Recent Labs    01/15/23 1040  PROBNP 521.0*   HbA1C: No results for input(s): "HGBA1C" in the last 72 hours. CBG: No results for input(s): "GLUCAP" in the last 168 hours. Lipid Profile: No results for input(s): "CHOL", "HDL", "LDLCALC", "TRIG", "CHOLHDL", "LDLDIRECT" in the last 72 hours. Thyroid Function Tests: No results for input(s): "TSH", "T4TOTAL", "FREET4", "T3FREE", "THYROIDAB" in the last 72 hours. Anemia Panel: Recent Labs    09/19/23 1830  VITAMINB12 292  FOLATE 21.5  FERRITIN 85  TIBC 346  IRON 38*  RETICCTPCT 6.2*   Sepsis Labs: No results for input(s): "PROCALCITON", "LATICACIDVEN" in the last 168 hours.  No results found for this or any previous visit (from the past 240 hours).       Radiology Studies: DG Chest 2 View Result Date: 09/19/2023 CLINICAL DATA:  Dyspnea, abnormal laboratory evaluation, dizziness, dark stools, history of lung cancer EXAM: CHEST - 2 VIEW COMPARISON:   01/24/2023 FINDINGS: Frontal and lateral views of the chest demonstrates postsurgical changes from median sternotomy and aortic valve replacement. Stable enlargement of the cardiac silhouette. Stable atherosclerosis of the thoracic aorta. Stable small bilateral pleural effusions. Stable postsurgical and post therapeutic changes at the right apex. Background emphysema. No airspace disease or pneumothorax. No acute bony abnormalities. IMPRESSION: 1. Stable enlarged cardiac silhouette. 2. Chronic small bilateral pleural effusions. 3. Stable postsurgical and post therapeutic changes at the right apex consistent with known history of prior lung cancer. 4. Emphysema.  No acute airspace disease. Electronically Signed   By: Sharlet Salina M.D.   On: 09/19/2023 18:53        Scheduled Meds:  sodium chloride   Intravenous Once   allopurinol  300 mg Oral Daily   amLODipine  5 mg Oral Daily  vitamin B-12  500 mcg Oral Daily   furosemide  20 mg Intravenous Once   pantoprazole (PROTONIX) IV  40 mg Intravenous Q12H   predniSONE  20 mg Oral Q breakfast   Continuous Infusions:   LOS: 0 days    Time spent: 35 minutes    Laurisa Sahakian A Myishia Kasik, MD [facility]   If 7PM-7AM, please contact night-coverage [email or web address]  09/20/2023, 8:03 AM

## 2023-09-20 NOTE — Plan of Care (Signed)

## 2023-09-20 NOTE — Evaluation (Signed)
Physical Therapy One Time Evaluation and Discharge from Acute PT Patient Details Name: [name] MRN: [id number] DOB: 1936-02-18 Today's Date: 09/20/2023  History of Present Illness  88 y.o. male admitted for acute blood loss anemia and melena.  Past medical history significant of aortic stenosis, A-fib on Coumadin, gout, hypertension, mitral valve prolapse, aortic valve replacement, with a pericardial tissue valve, back surgery  Clinical Impression  Patient evaluated by Physical Therapy with no further acute PT needs identified. All education has been completed and the patient has no further questions.   Pt reports he has been having no issues with mobility or balance.  Pt anticipates return home alone and hopeful to d/c as soon as possible. No further follow-up Physical Therapy or equipment needs. PT is signing off. Thank you for this referral.         If plan is discharge home, recommend the following:     Can travel by private vehicle        Equipment Recommendations None recommended by PT  Recommendations for Other Services       Functional Status Assessment Patient has had a recent decline in their functional status and demonstrates the ability to make significant improvements in function in a reasonable and predictable amount of time.     Precautions / Restrictions Precautions Precautions: Fall      Mobility  Bed Mobility Overal bed mobility: Modified Independent                  Transfers Overall transfer level: Modified independent                      Ambulation/Gait Ambulation/Gait assistance: Supervision, Modified independent (Device/Increase time) Gait Distance (Feet): 300 Feet Assistive device: None Gait Pattern/deviations: WFL(Within Functional Limits)       General Gait Details: denies any symptoms other then feeling a little weak, close to baseline, just finishing one unit of PRBCs  Stairs            Wheelchair Mobility      Tilt Bed    Modified Rankin (Stroke Patients Only)       Balance Overall balance assessment: No apparent balance deficits (not formally assessed) (denies any recent falls)                                           Pertinent Vitals/Pain Pain Assessment Pain Assessment: No/denies pain    Home Living Family/patient expects to be discharged to:: Private residence Living Arrangements: Alone Available Help at Discharge: Family Type of Home: House Home Access: Stairs to enter Entrance Stairs-Rails: Left Entrance Stairs-Number of Steps: 2   Home Layout: Able to live on main level with bedroom/bathroom Home Equipment: None      Prior Function Prior Level of Function : Independent/Modified Independent                     Extremity/Trunk Assessment   Upper Extremity Assessment Upper Extremity Assessment: Overall WFL for tasks assessed    Lower Extremity Assessment Lower Extremity Assessment: Overall WFL for tasks assessed    Cervical / Trunk Assessment Cervical / Trunk Assessment: Kyphotic;Other exceptions Cervical / Trunk Exceptions: forward head posture  Communication   Communication Communication: No apparent difficulties  Cognition Arousal: Alert Behavior During Therapy: WFL for tasks assessed/performed Overall Cognitive Status: Within Functional Limits for tasks  assessed                                          General Comments      Exercises     Assessment/Plan    PT Assessment Patient does not need any further PT services  PT Problem List         PT Treatment Interventions      PT Goals (Current goals can be found in the Care Plan section)  Acute Rehab PT Goals PT Goal Formulation: All assessment and education complete, DC therapy    Frequency       Co-evaluation               AM-PAC PT "6 Clicks" Mobility  Outcome Measure Help needed turning from your back to your side while in a flat  bed without using bedrails?: None Help needed moving from lying on your back to sitting on the side of a flat bed without using bedrails?: None Help needed moving to and from a bed to a chair (including a wheelchair)?: None Help needed standing up from a chair using your arms (e.g., wheelchair or bedside chair)?: None Help needed to walk in hospital room?: A Little Help needed climbing 3-5 steps with a railing? : A Little 6 Click Score: 22    End of Session   Activity Tolerance: Patient tolerated treatment well Patient left: in bed;with call bell/phone within reach Nurse Communication: Mobility status PT Visit Diagnosis: Difficulty in walking, not elsewhere classified (R26.2)    Time: 8119-1478 PT Time Calculation (min) (ACUTE ONLY): 11 min   Charges:   PT Evaluation $PT Eval Low Complexity: 1 Low   PT General Charges $$ ACUTE PT VISIT: 1 Visit        [name], DPT Physical Therapist Acute Rehabilitation Services Office: [phone]   [name] [place] 09/20/2023, 4:22 PM

## 2023-09-20 NOTE — Care Management Obs Status (Signed)
MEDICARE OBSERVATION STATUS NOTIFICATION   Patient Details  Name: [name] MRN: [id number] Date of Birth: 1935/11/15   Medicare Observation Status Notification Given:  Yes    Otelia Santee, LCSW 09/20/2023, 3:29 PM

## 2023-09-20 NOTE — Consult Note (Addendum)
Attending physician's note   I have taken a history, reviewed the chart, and examined the patient. I performed a substantive portion of this encounter, including complete performance of at least one of the key components, in conjunction with the APP. I agree with the APP's note, impression, and recommendations with my edits.   88 year old male with medical history as outlined below, to include history of bioprosthetic aortic valve, A-fib (on Coumadin), MVP, HTN, severe arthritis (has been on prednisone for 20 years and takes ibuprofen bid consistently), admitted with symptomatic anemia and melena.  Underwent tooth extractions x 3 last week, and subsequently had bleeding from his gums 24 hours later.  Was seen by his PCM for this issue in 09/18/2023 and endorsed dizziness, weakness, and orthostatic symptoms.  Stat CBC notable for H/H 8.9/26.8, down from baseline Hgb ~12.5 (also with MCV 104), and was referred to the ER.  Last episode of melena was 2 days ago.  No longer bleeding from recent dental extraction sites.  Admission evaluation notable for the following: - Downtrending H/H earlier this morning to 7.7/23.7;  transfused 1 unit RBCs - BUN/creatinine 18/1.2 - INR 1.7 - Ferritin 85, iron 38, TIBC 346, sat 11% - B12 292, folate 21.5  1) Melena 2) Symptomatic anemia 3) Acute blood loss anemia 4) Macrocytic anemia Discussed potential DDx for his melena and acute blood loss symptomatic anemia.  Certainly at elevated risk for gastric ulcers given his long-term steroids and chronic NSAID use in the setting of chronic anticoagulation.  However, does endorse a pretty significant volume of bleeding from his gums, and states that he swallowed/ingested much of this blood last week and earlier this week.  Offered EGD to evaluate for additional UGI pathology, particularly in the context of needing to restart anticoagulation.  He politely declines on multiple offers. - Trend serial CBCs - Continue  high-dose PPI.  Would benefit from continued PPI as outpatient for ongoing GI prophylaxis - If overt rebleeding, will again offer EGD.  Current INR 1.7, so no further washout would be needed per se - Start oral B12 and oral iron with repeat B12 and iron panel in 3 months as outpatient   5) Atrial fibrillation 6) Chronic anticoagulation - Holding Coumadin - Was evaluated by the Cardiology service earlier today and potentially changing to Eliquis instead.  Since we do not fully know the source of his bleeding, tough to know exactly when restarting anticoagulation would be okay.  With that said, there is a reasonable suspicion that this is secondary to oral bleeding with subsequently ingested blood causing the melena, and therefore can likely restart in 24-48 hours provided H/H stays stable and no further evidence of blood loss  GI service will remain available  Patria Mane [phone] office          Consultation  Referring Provider:  [facility]  Primary Care Physician:  Philip Aspen, Limmie Patricia, MD Primary Gastroenterologist: Dr. Jarold Motto     Reason for Consultation:     Melena and anemia  LOS: 0 days          HPI:   [name] is a 88 y.o. male with past medical history significant for aortic stenosis, A-fib on Coumadin, hypertension, MVP, aortic valve replacement, presents for evaluation of anemia and melena.  Patient states about 5 days ago he had a significant dental procedure in which 3 cracked teeth were removed.  He states he had significant amount of bleeding over  24 hours postprocedure in which he feels he lost "a pint of blood".  The following morning he began having dark stools.  He states his last dark stool was yesterday morning (1/22).  He then began to feel dizzy and weak which prompted his visit to the emergency department.  His last dose of Coumadin was 1/22 AM.  He denies nausea, vomiting, heartburn, abdominal pain.  He does take ibuprofen often (2  tablets in the morning and 2 tablets at night) for 6 years.  Notable workup - Hgb 7.7 (12.2 two months ago) - MCV 105.8, RDW 15.3 -- BUN 18, CR 1.19 - PT 19.9, INR 1.7 - BP 177/77, pulse 77 - Hemodynamically stable  PREVIOUS GI WORKUP   Colonoscopy 08/2003 for positive Hemoccult - 1 to 3 mm polyp in sigmoid colon, 10 mm sessile polyp in sigmoid colon, diverticulosis - Repeat 3 years  Past Medical History:  Diagnosis Date   Aortic stenosis    Atrial fibrillation (HCC)    Baker cyst    Left knee   DJD (degenerative joint disease)    knees   Dysrhythmia    Eczema    Erectile dysfunction    Gout    History of hiatal hernia    History of radiation therapy    Right lung- 04/21/21-05/03/21 Dr. Antony Blackbird   HTN (hypertension)    MVP (mitral valve prolapse)    Shortness of breath dyspnea    with activity    Surgical History:  He  has a past surgical history that includes Aortic valve replacement (~2004); right total hip (~4 years ago);  polyps vocal cord (1994); Rotator cuff repair (Left, ~2009); Lumbar laminectomy/decompression microdiscectomy (N/A, 01/15/2015); Hernia repair [phone]); Hernia repair [phone]); Total knee arthroplasty (Left, 04/14/2015); Lumbar laminectomy/decompression microdiscectomy (N/A, 05/01/2017); Video bronchoscopy (N/A, 09/21/2020); Bronchial washings (09/21/2020); Video bronchoscopy with endobronchial navigation (Right, 03/07/2021); Bronchial biopsy (03/07/2021); Bronchial brushings (03/07/2021); Bronchial washings (03/07/2021); Fiducial marker placement (03/07/2021); Bronchial needle aspiration biopsy (03/07/2021); Bronchial biopsy (11/14/2021); Bronchial brushings (11/14/2021); Bronchial needle aspiration biopsy (11/14/2021); Bronchial washings (11/14/2021); Video bronchoscopy with radial endobronchial ultrasound (11/14/2021); IR Radiologist Eval & Mgmt (11/17/2022); IR EMBO TUMOR ORGAN ISCHEMIA INFARCT INC GUIDE ROADMAPPING (12/01/2022); IR Angiogram Pelvis Selective Or  Supraselective (12/01/2022); IR US Guide Vasc Access Right (12/01/2022); IR US Guide Vasc Access Left (12/01/2022); IR Angiogram Pelvis Selective Or Supraselective (12/01/2022); IR CT Spine Ltd (12/01/2022); IR Angiogram Selective Each Additional Vessel (12/01/2022); IR EMBO ARTERIAL NOT HEMORR HEMANG INC GUIDE ROADMAPPING (12/01/2022); IR CT Spine Ltd (12/01/2022); IR CT Spine Ltd (12/01/2022); and IR Angiogram Selective Each Additional Vessel (12/01/2022). Family History:  His family history includes Diabetes in an other family member; Stroke in an other family member. Social History:   reports that he quit smoking about 21 years ago. His smoking use included cigarettes. He started smoking about 51 years ago. He has never used smokeless tobacco. He reports current alcohol use of about 3.0 standard drinks of alcohol per week. He reports that he does not use drugs.  Prior to Admission medications   Medication Sig Start Date End Date Taking? Authorizing Provider  allopurinol (ZYLOPRIM) 300 MG tablet Take 1 tablet by mouth once daily Patient taking differently: Take 300 mg by mouth in the morning. 04/24/23  Yes Philip Aspen, Limmie Patricia, MD  amLODipine (NORVASC) 5 MG tablet Take 1 tablet by mouth once daily Patient taking differently: Take 5 mg by mouth in the morning. 04/24/23  Yes Philip Aspen, Limmie Patricia, MD  furosemide (LASIX)  20 MG tablet Take 3 tablets (60 mg total) by mouth daily. Patient taking differently: Take 60 mg by mouth in the morning. 06/28/23  Yes Lewayne Bunting, MD  ibuprofen (ADVIL) 200 MG tablet Take 400 mg by mouth in the morning and at bedtime.   Yes [provider]  potassium chloride SA (KLOR-CON M) 20 MEQ tablet Take 1 tablet (20 mEq total) by mouth daily. Patient taking differently: Take 20 mEq by mouth in the morning. 07/11/23  Yes Lewayne Bunting, MD  predniSONE (DELTASONE) 20 MG tablet Take 1 tablet by mouth once daily with breakfast 07/09/23  Yes Philip Aspen, Limmie Patricia, MD   tamsulosin (FLOMAX) 0.4 MG CAPS capsule TAKE 1 CAPSULE BY MOUTH ONCE DAILY AFTER BREAKFAST Patient taking differently: Take 0.4 mg by mouth daily after breakfast. 12/25/22  Yes Philip Aspen, Limmie Patricia, MD  traMADol (ULTRAM) 50 MG tablet TAKE 1 TABLET BY MOUTH EVERY 12 HOURS AS NEEDED 09/10/23  Yes Philip Aspen, Limmie Patricia, MD  traZODone (DESYREL) 50 MG tablet TAKE 1 TABLET BY MOUTH AT BEDTIME AS NEEDED 07/10/23  Yes Philip Aspen, Limmie Patricia, MD  triamcinolone cream (KENALOG) 0.1 % APPLY  CREAM EXTERNALLY TO AFFECTED AREA TWICE DAILY Patient taking differently: Apply 1 Application topically 2 (two) times daily as needed. 05/22/22  Yes Philip Aspen, Limmie Patricia, MD  VITAMIN D PO Take 1 tablet by mouth in the morning.   Yes [provider]  warfarin (COUMADIN) 5 MG tablet TAKE 1/2 TABLET BY MOUTH  DAILY EXCEPT TAKE 1 TABLET ON WEDNESDAY OR AS  DIRECTED  BY  ANTICOAGULATION  CLINIC Patient taking differently: Take 2.5-5 mg by mouth See admin instructions. Take 0.5 tablet every morning  except take 1 tablet on Wednesday or as directed by anticoagulation clinic. 07/06/23  Yes Henderson Cloud, MD    Current Facility-Administered Medications  Medication Dose Route Frequency Provider Last Rate Last Admin   0.9 %  sodium chloride infusion (Manually program via Guardrails IV Fluids)   Intravenous Once Regalado, Belkys A, MD       acetaminophen (TYLENOL) tablet 650 mg  650 mg Oral Q6H PRN Regalado, Belkys A, MD       Or   acetaminophen (TYLENOL) suppository 650 mg  650 mg Rectal Q6H PRN Regalado, Belkys A, MD       allopurinol (ZYLOPRIM) tablet 300 mg  300 mg Oral Daily Regalado, Belkys A, MD       amLODipine (NORVASC) tablet 5 mg  5 mg Oral Daily Regalado, Belkys A, MD       cyanocobalamin (VITAMIN B12) tablet 500 mcg  500 mcg Oral Daily Regalado, Belkys A, MD       hydrALAZINE (APRESOLINE) injection 5 mg  5 mg Intravenous Q6H PRN Regalado, Belkys A, MD       ondansetron (ZOFRAN)  tablet 4 mg  4 mg Oral Q6H PRN Regalado, Belkys A, MD       Or   ondansetron (ZOFRAN) injection 4 mg  4 mg Intravenous Q6H PRN Regalado, Belkys A, MD       pantoprazole (PROTONIX) injection 40 mg  40 mg Intravenous Q12H Regalado, Belkys A, MD   40 mg at 09/20/23 0920   predniSONE (DELTASONE) tablet 20 mg  20 mg Oral Q breakfast Regalado, Belkys A, MD       traMADol (ULTRAM) tablet 50 mg  50 mg Oral Q12H PRN Regalado, Belkys A, MD       traZODone (DESYREL) tablet  50 mg  50 mg Oral QHS PRN Regalado, Belkys A, MD   50 mg at 09/19/23 2231    Allergies as of 09/19/2023   (No Known Allergies)    Review of Systems  Constitutional:  Negative for chills, fever and weight loss.  HENT:  Negative for hearing loss and tinnitus.   Eyes:  Negative for blurred vision and double vision.  Respiratory:  Negative for cough and hemoptysis.   Cardiovascular:  Negative for chest pain and palpitations.  Gastrointestinal:  Positive for melena. Negative for abdominal pain, blood in stool, constipation, diarrhea, heartburn, nausea and vomiting.  Genitourinary:  Negative for dysuria and urgency.  Musculoskeletal:  Negative for myalgias and neck pain.  Skin:  Negative for itching and rash.  Neurological:  Positive for dizziness and weakness. Negative for seizures and loss of consciousness.  Psychiatric/Behavioral:  Negative for depression and suicidal ideas.        Physical Exam:  Vital signs in last 24 hours: Temp:  [97.5 F (36.4 C)-98.6 F (37 C)] 97.8 F (36.6 C) (01/23 0808) Pulse Rate:  [69-82] 77 (01/23 0808) Resp:  [14-18] 18 (01/23 0808) BP: (121-177)/(58-109) 177/77 (01/23 0808) SpO2:  [93 %-100 %] 100 % (01/23 0808) Weight:  [72.6 kg] 72.6 kg (01/22 1157) Last BM Date : 09/19/23 Last BM recorded by nurses in past 5 days No data recorded  Physical Exam Constitutional:      Appearance: Normal appearance. He is not ill-appearing.  HENT:     Head: Normocephalic and atraumatic.     Nose: Nose  normal. No congestion.  Eyes:     Extraocular Movements: Extraocular movements intact.     Comments: Conjunctival pallor  Cardiovascular:     Rate and Rhythm: Normal rate and regular rhythm.  Pulmonary:     Effort: Pulmonary effort is normal. No respiratory distress.  Abdominal:     General: Bowel sounds are normal. There is no distension.     Palpations: Abdomen is soft. There is no mass.     Tenderness: There is no abdominal tenderness. There is no guarding or rebound.     Hernia: No hernia is present.  Musculoskeletal:        General: No swelling. Normal range of motion.     Cervical back: Normal range of motion and neck supple.  Skin:    General: Skin is warm and dry.     Coloration: Skin is pale.  Neurological:     General: No focal deficit present.     Mental Status: He is oriented to person, place, and time.  Psychiatric:        Mood and Affect: Mood normal.        Behavior: Behavior normal.        Thought Content: Thought content normal.        Judgment: Judgment normal.      LAB RESULTS: Recent Labs    09/18/23 1607 09/19/23 1316 09/19/23 1830 09/20/23 0501  WBC 6.4 8.8  --  5.8  HGB 8.9 Repeated and verified X2.* 8.9* 8.6* 7.7*  HCT 26.8* 28.1* 27.0* 23.7*  PLT 196.0 196  --  172   BMET Recent Labs    09/19/23 1316 09/20/23 0501  NA 139 139  K 3.3* 4.1  CL 101 103  CO2 27 30  GLUCOSE 108* 92  BUN 23 18  CREATININE 1.06 1.19  CALCIUM 8.9 8.5*   LFT Recent Labs    09/20/23 0501  PROT 5.4*  ALBUMIN  3.2*  AST 16  ALT 15  ALKPHOS 36*  BILITOT 0.7   PT/INR Recent Labs    09/19/23 1316 09/20/23 0501  LABPROT 17.1* 19.9*  INR 1.4* 1.7*    STUDIES: DG Chest 2 View Result Date: 09/19/2023 CLINICAL DATA:  Dyspnea, abnormal laboratory evaluation, dizziness, dark stools, history of lung cancer EXAM: CHEST - 2 VIEW COMPARISON:  01/24/2023 FINDINGS: Frontal and lateral views of the chest demonstrates postsurgical changes from median sternotomy  and aortic valve replacement. Stable enlargement of the cardiac silhouette. Stable atherosclerosis of the thoracic aorta. Stable small bilateral pleural effusions. Stable postsurgical and post therapeutic changes at the right apex. Background emphysema. No airspace disease or pneumothorax. No acute bony abnormalities. IMPRESSION: 1. Stable enlarged cardiac silhouette. 2. Chronic small bilateral pleural effusions. 3. Stable postsurgical and post therapeutic changes at the right apex consistent with known history of prior lung cancer. 4. Emphysema.  No acute airspace disease. Electronically Signed   By: Sharlet Salina M.D.   On: 09/19/2023 18:53      Impression    Symptomatic anemia Melena Hgb 7.7 (down from 12.4 2 months ago), MCV 105.8, RDW 15.3 BUN 18, CR 1.19 Development of melena and anemia after a dental procedure with profuse blood loss.  Suspect his melena/anemia is secondary to his recent dental procedure.  Although, he does have a significant history of NSAID use so we cannot rule out gastritis/PUD.  Patient would have to wait for Coumadin washout prior to having EGD.  Patient states he is not okay with this and would like to leave today if possible and is not interested in having an EGD as he feels it is related to his dental procedure.  Discussed without an EGD we cannot definitively rule out upper GI bleed and he acknowledges and is aware of this.  History of A-fib on Coumadin INR 1.4 Last dose of Coumadin 1/22 AM    Plan   - Patient declines EGD at this time and would like to go home today.  His hemoglobin has dropped some since admission but he has not had overt bleeding since admission - Continue daily CBC and transfuse as needed to maintain HGB > 7 - PPI twice daily - Continue to monitor for bleeding  Thank you for your kind consultation, we will continue to follow.   Bayley Leanna Sato  09/20/2023, 9:22 AM

## 2023-09-20 NOTE — Consult Note (Addendum)
CARDIOLOGY CONSULT NOTE       Patient ID: [name] MRN: [id number] DOB/AGE: Jan 17, 1936 88 y.o.  Admit date: 09/19/2023 Referring Physician: Sunnie Nielsen Primary Physician: Philip Aspen, Limmie Patricia, MD Primary Cardiologist: Jens Som Reason for Consultation: Anticoagulation Management  Principal Problem:   GI bleed   HPI:  88 y.o. with history of AVR/bioprosthetic, chronic afib on coumadin. Admitted with  anemia and drop in Hb ? GI bleed and recent dental work. Hb 8.9 -> 7.7.  His INR has been consistently sub Rx over the last 6 months. Long time ago he was on Pradaxa but he seems to think he had to be on coumadin for his valve. His afib is chronic and well rate controlled TTE done 01/19/23 showed normal bioprosthetic AVR function with no PVL and mean gradient 16 mmHg. He is currently stable Wants to go home to take care of his dogs. No chest pain, pre syncope dyspnea His teeth hurt. He is getting transfused with plans for EGD  INR 1.7 this am  ROS All other systems reviewed and negative except as noted above  Past Medical History:  Diagnosis Date   Aortic stenosis    Atrial fibrillation (HCC)    Baker cyst    Left knee   DJD (degenerative joint disease)    knees   Dysrhythmia    Eczema    Erectile dysfunction    Gout    History of hiatal hernia    History of radiation therapy    Right lung- 04/21/21-05/03/21 Dr. Antony Blackbird   HTN (hypertension)    MVP (mitral valve prolapse)    Shortness of breath dyspnea    with activity    Family History  Problem Relation Age of Onset   Diabetes Other    Stroke Other     Social History   Socioeconomic History   Marital status: Divorced    Spouse name: Not on file   Number of children: Not on file   Years of education: Not on file   Highest education level: Not on file  Occupational History   Not on file  Tobacco Use   Smoking status: Former    Current packs/day: 0.00    Types: Cigarettes    Start date: 08/28/1972     Quit date: 08/28/2002    Years since quitting: 21.0   Smokeless tobacco: Never  Vaping Use   Vaping status: Never Used  Substance and Sexual Activity   Alcohol use: Yes    Alcohol/week: 3.0 standard drinks of alcohol    Types: 3 Shots of liquor per week    Comment: social   Drug use: No   Sexual activity: Not on file  Other Topics Concern   Not on file  Social History Narrative   Not on file   Social Drivers of Health   Financial Resource Strain: Low Risk  (10/26/2022)   Overall Financial Resource Strain (CARDIA)    Difficulty of Paying Living Expenses: Not hard at all  Food Insecurity: No Food Insecurity (09/19/2023)   Hunger Vital Sign    Worried About Running Out of Food in the Last Year: Never true    Ran Out of Food in the Last Year: Never true  Transportation Needs: No Transportation Needs (09/19/2023)   PRAPARE - Administrator, Civil Service (Medical): No    Lack of Transportation (Non-Medical): No  Physical Activity: Insufficiently Active (10/26/2022)   Exercise Vital Sign    Days of Exercise per Week:  3 days    Minutes of Exercise per Session: 30 min  Stress: No Stress Concern Present (10/26/2022)   [employer] of Occupational Health - Occupational Stress Questionnaire    Feeling of Stress : Not at all  Social Connections: Moderately Isolated (09/20/2023)   Social Connection and Isolation Panel [NHANES]    Frequency of Communication with Friends and Family: More than three times a week    Frequency of Social Gatherings with Friends and Family: Once a week    Attends Religious Services: More than 4 times per year    Active Member of [employer] or Organizations: No    Attends Banker Meetings: Never    Marital Status: Divorced  Catering manager Violence: Not At Risk (09/19/2023)   Humiliation, Afraid, Rape, and Kick questionnaire    Fear of Current or Ex-Partner: No    Emotionally Abused: No    Physically Abused: No    Sexually Abused: No     Past Surgical History:  Procedure Laterality Date    polyps vocal cord  1994   AORTIC VALVE REPLACEMENT  ~2004   BRONCHIAL BIOPSY  03/07/2021   Procedure: BRONCHIAL BIOPSIES;  Surgeon: Leslye Peer, MD;  Location: [facility];  Service: Pulmonary;;   BRONCHIAL BIOPSY  11/14/2021   Procedure: BRONCHIAL BIOPSIES;  Surgeon: Leslye Peer, MD;  Location: [facility];  Service: Pulmonary;;   BRONCHIAL BRUSHINGS  03/07/2021   Procedure: BRONCHIAL BRUSHINGS;  Surgeon: Leslye Peer, MD;  Location: [facility];  Service: Pulmonary;;   BRONCHIAL BRUSHINGS  11/14/2021   Procedure: BRONCHIAL BRUSHINGS;  Surgeon: Leslye Peer, MD;  Location: [facility];  Service: Pulmonary;;   BRONCHIAL NEEDLE ASPIRATION BIOPSY  03/07/2021   Procedure: BRONCHIAL NEEDLE ASPIRATION BIOPSIES;  Surgeon: Leslye Peer, MD;  Location: [facility];  Service: Pulmonary;;   BRONCHIAL NEEDLE ASPIRATION BIOPSY  11/14/2021   Procedure: BRONCHIAL NEEDLE ASPIRATION BIOPSIES;  Surgeon: Leslye Peer, MD;  Location: [facility];  Service: Pulmonary;;   BRONCHIAL WASHINGS  09/21/2020   Procedure: BRONCHIAL WASHINGS;  Surgeon: Martina Sinner, MD;  Location: [facility];  Service: Pulmonary;;  BAL    BRONCHIAL WASHINGS  03/07/2021   Procedure: BRONCHIAL WASHINGS;  Surgeon: Leslye Peer, MD;  Location: [facility];  Service: Pulmonary;;   BRONCHIAL WASHINGS  11/14/2021   Procedure: BRONCHIAL WASHINGS;  Surgeon: Leslye Peer, MD;  Location: [facility];  Service: Pulmonary;;   FIDUCIAL MARKER PLACEMENT  03/07/2021   Procedure: FIDUCIAL MARKER PLACEMENT;  Surgeon: Leslye Peer, MD;  Location: [facility];  Service: Pulmonary;;   HERNIA REPAIR  [phone]   HERNIA REPAIR  ~1983   IR ANGIOGRAM PELVIS SELECTIVE OR SUPRASELECTIVE  12/01/2022   IR ANGIOGRAM PELVIS SELECTIVE OR SUPRASELECTIVE  12/01/2022   IR ANGIOGRAM SELECTIVE EACH ADDITIONAL VESSEL  12/01/2022   IR ANGIOGRAM SELECTIVE EACH ADDITIONAL VESSEL  12/01/2022    IR CT SPINE LTD  12/01/2022   IR CT SPINE LTD  12/01/2022   IR CT SPINE LTD  12/01/2022   IR EMBO ARTERIAL NOT HEMORR HEMANG INC GUIDE ROADMAPPING  12/01/2022   IR EMBO TUMOR ORGAN ISCHEMIA INFARCT INC GUIDE ROADMAPPING  12/01/2022   IR RADIOLOGIST EVAL & MGMT  11/17/2022   IR US GUIDE VASC ACCESS LEFT  12/01/2022   IR US GUIDE VASC ACCESS RIGHT  12/01/2022   LUMBAR LAMINECTOMY/DECOMPRESSION MICRODISCECTOMY N/A 01/15/2015   Procedure: 2 LEVEL DECOMPRESSIVE LUMBAR LAMINECTOMY L3-L4,L4-L5;  Surgeon: Ranee Gosselin, MD;  Location: [facility];  Service: Orthopedics;  Laterality: N/A;   LUMBAR LAMINECTOMY/DECOMPRESSION MICRODISCECTOMY N/A 05/01/2017   Procedure: Lumbar one-Lumbar two, Lumbar two-Lumbar three, Lumbar three-Lumbar four Laminectomy; Evacuation of hematoma;  Surgeon: Lisbeth Renshaw, MD;  Location: [facility];  Service: Neurosurgery;  Laterality: N/A;   right total hip  ~4 years ago   ROTATOR CUFF REPAIR Left ~2009   TOTAL KNEE ARTHROPLASTY Left 04/14/2015   Procedure: LEFT TOTAL KNEE ARTHROPLASTY;  Surgeon: Ranee Gosselin, MD;  Location: [facility];  Service: Orthopedics;  Laterality: Left;   VIDEO BRONCHOSCOPY N/A 09/21/2020   Procedure: VIDEO BRONCHOSCOPY WITHOUT FLUORO;  Surgeon: Martina Sinner, MD;  Location: [facility];  Service: Pulmonary;  Laterality: N/A;   VIDEO BRONCHOSCOPY WITH ENDOBRONCHIAL NAVIGATION Right 03/07/2021   Procedure: VIDEO BRONCHOSCOPY WITH ENDOBRONCHIAL NAVIGATION;  Surgeon: Leslye Peer, MD;  Location: [facility];  Service: Pulmonary;  Laterality: Right;   VIDEO BRONCHOSCOPY WITH RADIAL ENDOBRONCHIAL ULTRASOUND  11/14/2021   Procedure: VIDEO BRONCHOSCOPY WITH RADIAL ENDOBRONCHIAL ULTRASOUND;  Surgeon: Leslye Peer, MD;  Location: [facility];  Service: Pulmonary;;      Current Facility-Administered Medications:    0.9 %  sodium chloride infusion (Manually program via Guardrails IV Fluids), , Intravenous, Once, Regalado, Belkys A, MD   acetaminophen (TYLENOL) tablet 650  mg, 650 mg, Oral, Q6H PRN **OR** acetaminophen (TYLENOL) suppository 650 mg, 650 mg, Rectal, Q6H PRN, Regalado, Belkys A, MD   allopurinol (ZYLOPRIM) tablet 300 mg, 300 mg, Oral, Daily, Regalado, Belkys A, MD, 300 mg at 09/20/23 0925   amLODipine (NORVASC) tablet 5 mg, 5 mg, Oral, Daily, Regalado, Belkys A, MD, 5 mg at 09/20/23 1610   cyanocobalamin (VITAMIN B12) tablet 500 mcg, 500 mcg, Oral, Daily, Regalado, Belkys A, MD, 500 mcg at 09/20/23 0926   hydrALAZINE (APRESOLINE) injection 5 mg, 5 mg, Intravenous, Q6H PRN, Regalado, Belkys A, MD   ondansetron (ZOFRAN) tablet 4 mg, 4 mg, Oral, Q6H PRN **OR** ondansetron (ZOFRAN) injection 4 mg, 4 mg, Intravenous, Q6H PRN, Regalado, Belkys A, MD   pantoprazole (PROTONIX) injection 40 mg, 40 mg, Intravenous, Q12H, Regalado, Belkys A, MD, 40 mg at 09/20/23 0920   predniSONE (DELTASONE) tablet 20 mg, 20 mg, Oral, Q breakfast, Regalado, Belkys A, MD, 20 mg at 09/20/23 0924   traMADol (ULTRAM) tablet 50 mg, 50 mg, Oral, Q12H PRN, Regalado, Belkys A, MD   traZODone (DESYREL) tablet 50 mg, 50 mg, Oral, QHS PRN, Regalado, Belkys A, MD, 50 mg at 09/19/23 2231  sodium chloride   Intravenous Once   allopurinol  300 mg Oral Daily   amLODipine  5 mg Oral Daily   vitamin B-12  500 mcg Oral Daily   pantoprazole (PROTONIX) IV  40 mg Intravenous Q12H   predniSONE  20 mg Oral Q breakfast     Physical Exam: Blood pressure (!) 177/77, pulse 77, temperature 97.8 F (36.6 C), resp. rate 18, height 5\' 6"  (1.676 m), weight 72.6 kg, SpO2 100%.    Elderly Argent[place] male Poor dentition with recent surgery SEM through AVR no AR Lungs interstitial crackles right base  Abdomen with small umbilical hernia No edema Prior left TKR  Prior lumbar laminectomy  Labs:   Lab Results  Component Value Date   WBC 5.8 09/20/2023   HGB 7.7 (L) 09/20/2023   HCT 23.7 (L) 09/20/2023   MCV 105.8 (H) 09/20/2023   PLT 172 09/20/2023    Recent Labs  Lab 09/20/23 0501  NA 139   K 4.1  CL 103  CO2 30  BUN 18  CREATININE 1.19  CALCIUM 8.5*  PROT 5.4*  BILITOT 0.7  ALKPHOS 36*  ALT 15  AST 16  GLUCOSE 92   No results found for: "CKTOTAL", "CKMB", "CKMBINDEX", "TROPONINI"  Lab Results  Component Value Date   CHOL 208 (H) 10/26/2022   CHOL 197 10/25/2021   CHOL 220 (H) 10/21/2020   Lab Results  Component Value Date   HDL 50.50 10/26/2022   HDL 56.20 10/25/2021   HDL 57.80 10/21/2020   Lab Results  Component Value Date   LDLCALC 128 (H) 10/26/2022   LDLCALC 121 (H) 10/25/2021   LDLCALC 140 (H) 10/21/2020   Lab Results  Component Value Date   TRIG 145.0 10/26/2022   TRIG 99.0 10/25/2021   TRIG 108.0 10/21/2020   Lab Results  Component Value Date   CHOLHDL 4 10/26/2022   CHOLHDL 4 10/25/2021   CHOLHDL 4 10/21/2020   No results found for: "LDLDIRECT"    Radiology: DG Chest 2 View Result Date: 09/19/2023 CLINICAL DATA:  Dyspnea, abnormal laboratory evaluation, dizziness, dark stools, history of lung cancer EXAM: CHEST - 2 VIEW COMPARISON:  01/24/2023 FINDINGS: Frontal and lateral views of the chest demonstrates postsurgical changes from median sternotomy and aortic valve replacement. Stable enlargement of the cardiac silhouette. Stable atherosclerosis of the thoracic aorta. Stable small bilateral pleural effusions. Stable postsurgical and post therapeutic changes at the right apex. Background emphysema. No airspace disease or pneumothorax. No acute bony abnormalities. IMPRESSION: 1. Stable enlarged cardiac silhouette. 2. Chronic small bilateral pleural effusions. 3. Stable postsurgical and post therapeutic changes at the right apex consistent with known history of prior lung cancer. 4. Emphysema.  No acute airspace disease. Electronically Signed   By: Sharlet Salina M.D.   On: 09/19/2023 18:53    EKG: Afib RBBB no acute ST changes    ASSESSMENT AND PLAN:   Afib:  chronic rates are fine has severe bi atrial enlargement on TTE  He has had  bradycardia and SSS and did not want PPM in past. That is why he is only on norvasc as weak AV nodal drug. Holding coumadin I spoke with Dr Jens Som There is no reason why he cannot be changed to eliquis for his afib as he has a bioprosthetic AVR Weight and renal function good enough to use 5 mg bid once his anemia and GI issue worked out  AVR:  normal function by TTE May 2024 not clear that he got SBE prophylaxis with dental work update TTE in hospital Lung cancer. Non small cell post SBRT 2022 abnormal lung exam on right stable not on oxygen f/u oncology Negative bronchoscopy March 2023 Diastolic CHF:  on lasix 60 mg at home will need diuretic with transfusions   Signed: Charlton Haws 09/20/2023, 10:37 AM

## 2023-09-21 ENCOUNTER — Ambulatory Visit: Payer: Medicare Other

## 2023-09-21 ENCOUNTER — Telehealth: Payer: Self-pay

## 2023-09-21 ENCOUNTER — Telehealth: Payer: Self-pay | Admitting: Cardiology

## 2023-09-21 ENCOUNTER — Ambulatory Visit: Payer: Self-pay

## 2023-09-21 DIAGNOSIS — D62 Acute posthemorrhagic anemia: Secondary | ICD-10-CM | POA: Diagnosis not present

## 2023-09-21 DIAGNOSIS — Z952 Presence of prosthetic heart valve: Secondary | ICD-10-CM | POA: Diagnosis not present

## 2023-09-21 DIAGNOSIS — I482 Chronic atrial fibrillation, unspecified: Secondary | ICD-10-CM | POA: Diagnosis not present

## 2023-09-21 DIAGNOSIS — K921 Melena: Secondary | ICD-10-CM | POA: Diagnosis not present

## 2023-09-21 DIAGNOSIS — K922 Gastrointestinal hemorrhage, unspecified: Secondary | ICD-10-CM | POA: Diagnosis not present

## 2023-09-21 LAB — BASIC METABOLIC PANEL
Anion gap: 9 (ref 5–15)
BUN: 15 mg/dL (ref 8–23)
CO2: 26 mmol/L (ref 22–32)
Calcium: 8.5 mg/dL — ABNORMAL LOW (ref 8.9–10.3)
Chloride: 103 mmol/L (ref 98–111)
Creatinine, Ser: 1.07 mg/dL (ref 0.61–1.24)
GFR, Estimated: >60 mL/min (ref >60)
Glucose, Bld: 85 mg/dL (ref 70–99)
Potassium: 3.8 mmol/L (ref 3.5–5.1)
Sodium: 138 mmol/L (ref 135–145)

## 2023-09-21 LAB — CBC
HCT: 28.4 % — ABNORMAL LOW (ref 39.0–52.0)
Hemoglobin: 9.1 g/dL — ABNORMAL LOW (ref 13.0–17.0)
MCH: 33 pg (ref 26.0–34.0)
MCHC: 32 g/dL (ref 30.0–36.0)
MCV: 102.9 fL — ABNORMAL HIGH (ref 80.0–100.0)
Platelets: 181 10*3/uL (ref 150–400)
RBC: 2.76 MIL/uL — ABNORMAL LOW (ref 4.22–5.81)
RDW: 16.6 % — ABNORMAL HIGH (ref 11.5–15.5)
WBC: 5.3 10*3/uL (ref 4.0–10.5)
nRBC: 0 % (ref 0.0–0.2)

## 2023-09-21 LAB — TYPE AND SCREEN
ABO/RH(D): O POS
Antibody Screen: NEGATIVE
Unit division: 0

## 2023-09-21 LAB — BPAM RBC
Blood Product Expiration Date: 202502222359
ISSUE DATE / TIME: 202501231217
Unit Type and Rh: 5100

## 2023-09-21 LAB — PROTIME-INR
INR: 1.6 — ABNORMAL HIGH (ref 0.8–1.2)
Prothrombin Time: 19.3 s — ABNORMAL HIGH (ref 11.4–15.2)

## 2023-09-21 MED ORDER — APIXABAN 5 MG PO TABS: 5.0000 mg | ORAL_TABLET | Freq: Two times a day (BID) | ORAL | 0 refills | Status: DC

## 2023-09-21 MED ORDER — APIXABAN 5 MG PO TABS
5.0000 mg | ORAL_TABLET | Freq: Two times a day (BID) | ORAL | Status: DC
  Administered 2023-09-21: 5 mg via ORAL
  Filled 2023-09-21: qty 1

## 2023-09-21 MED ORDER — CYANOCOBALAMIN 500 MCG PO TABS: 500.0000 ug | ORAL_TABLET | Freq: Every day | ORAL | 0 refills | Status: AC

## 2023-09-21 MED ORDER — PREDNISONE 5 MG PO TABS: 15.0000 mg | ORAL_TABLET | Freq: Every day | ORAL | 0 refills | Status: DC

## 2023-09-21 MED ORDER — PANTOPRAZOLE SODIUM 40 MG PO TBEC: 40.0000 mg | DELAYED_RELEASE_TABLET | Freq: Two times a day (BID) | ORAL | 1 refills | Status: DC

## 2023-09-21 NOTE — Discharge Instructions (Signed)

## 2023-09-21 NOTE — Discharge Summary (Signed)
Physician Discharge Summary   Patient: [name] MRN: [id number] DOB: 29-Jul-1936  Admit date:     09/19/2023  Discharge date: 09/21/23  Discharge Physician: Alba Cory   PCP: Philip Aspen, Limmie Patricia, MD   Recommendations at discharge:   Needs CBC to follow up Hb.  Coumadin was change to Eliquis by Cardiology Follow up with GI as needed.  Avoid Ibuprofen, due to increase for bleeding and risk for ulcers.  Prednisone dose reduce to 15 mg daily to reduce risk of gastritis, ulcer. Consider further reduction if his arthritis allows it.  PPI BID for 8 weeks, the daily indefinitely  Started on B 12 supplement for low normal B 12  Discharge Diagnoses: Principal Problem:   GI bleed Active Problems:   Melena   Acute blood loss anemia   Symptomatic anemia  Resolved Problems:   * No resolved hospital problems. *  Hospital Course: 88 y.o. male with medical history significant of aortic stenosis, A-fib on Coumadin, gout, hypertension, mitral valve prolapse, aortic valve replacement, with a pericardial tissue valve, presenting referred by PCP due to anemia drop in hemoglobin from 12-8.9.  Patient had dental procedure 5 days prior to admission, he had 3 cracked teeth removed.  He bled a bit over 24 hours postprocedure.  He reports dark stool,that started Saturday (5 days ago) .  He was feeling dizzy and weak.       Admitted for acute blood loss anemia and Melena. Took coumadin 1/22 AM. He held coumadin 3 days post dental extraction as instructed.     Assessment and Plan: 1-Acute blood loss anemia, Melena:  Present with a hemoglobin of 8.9, hemoglobin 2 months ago was around 12. -Anemia panel: consistent with iron deficiency. Low normal B 12 started  supplement.  -IV Protonix IV, BID./  -[facility] has been consulted. Currently NPO -Hb down to 7,6--received one unit PRBC. On 1/23. Hb today at 9 He feels better. He decline Endoscopy. Plan to treat him empirically with  Protonix 40 mg PO BID for 8 weeks then daily. Unable to rule out ulcer, gastritis. He was taking Ibuprofen. Unclear if melena was related to him swallowing blood from recent dental extraction.     History of A-fib on Coumadin -He took Coumadin 1/22 AM. INR today at 1.7 -Plan to hold anticoagulation due to active GI bleed -Consulted Cardiology to assist with management, risk benefit. Patient is concern that he is not on anticoagulation. Also would patient be candidate for Eliquis at some point.  He has been transition to Eliquis from coumadin.    History of aortic valve replacement, pericardial tissue valve -History of chronic diastolic heart failure -Follow-up with Dr. Jens Som -Resume lasix post endoscopy   Hypertension: -Continue with Norvasc   Hypokalemia: -Replete orally   Arthritis: Plan to hold ibuprofen in the setting of GI bleed Will continue prednisone, he has been taking prednisone for more than 20 years.  If he is found to have an ulcer might need to have dose reduce  slowly          Consultants: Cardiology , GI Procedures performed: none Disposition: Home Diet recommendation:  Discharge Diet Orders (From admission, onward)     Start     Ordered   09/21/23 0000  Diet - low sodium heart healthy        09/21/23 1314           Cardiac diet DISCHARGE MEDICATION: Allergies as of 09/21/2023   No Known Allergies  Medication List     STOP taking these medications    ibuprofen 200 MG tablet Commonly known as: ADVIL   warfarin 5 MG tablet Commonly known as: COUMADIN       TAKE these medications    allopurinol 300 MG tablet Commonly known as: ZYLOPRIM Take 1 tablet by mouth once daily What changed: when to take this   amLODipine 5 MG tablet Commonly known as: NORVASC Take 1 tablet by mouth once daily What changed: when to take this   apixaban 5 MG Tabs tablet Commonly known as: ELIQUIS Take 1 tablet (5 mg total) by mouth 2 (two) times  daily.   cyanocobalamin 500 MCG tablet Commonly known as: VITAMIN B12 Take 1 tablet (500 mcg total) by mouth daily. Start taking on: September 22, 2023   furosemide 20 MG tablet Commonly known as: LASIX Take 3 tablets (60 mg total) by mouth daily. What changed: when to take this   pantoprazole 40 MG tablet Commonly known as: Protonix Take 1 tablet (40 mg total) by mouth 2 (two) times daily.   potassium chloride SA 20 MEQ tablet Commonly known as: KLOR-CON M Take 1 tablet (20 mEq total) by mouth daily. What changed: when to take this   predniSONE 5 MG tablet Commonly known as: DELTASONE Take 3 tablets (15 mg total) by mouth daily with breakfast. What changed:  medication strength how much to take   tamsulosin 0.4 MG Caps capsule Commonly known as: FLOMAX TAKE 1 CAPSULE BY MOUTH ONCE DAILY AFTER BREAKFAST What changed: See the new instructions.   traMADol 50 MG tablet Commonly known as: ULTRAM TAKE 1 TABLET BY MOUTH EVERY 12 HOURS AS NEEDED   traZODone 50 MG tablet Commonly known as: DESYREL TAKE 1 TABLET BY MOUTH AT BEDTIME AS NEEDED   triamcinolone cream 0.1 % Commonly known as: KENALOG APPLY  CREAM EXTERNALLY TO AFFECTED AREA TWICE DAILY What changed: See the new instructions.   VITAMIN D PO Take 1 tablet by mouth in the morning.        Follow-up Information     Philip Aspen, Limmie Patricia, MD Follow up in 1 week(s).   Specialty: Internal Medicine Contact information: [address] [place] [address] [phone]                Discharge Exam: Ceasar Mons Weights   09/19/23 1157  Weight: 72.6 kg   General; NAD  Condition at discharge: stable  The results of significant diagnostics from this hospitalization (including imaging, microbiology, ancillary and laboratory) are listed below for reference.   Imaging Studies: DG Chest 2 View Result Date: 09/19/2023 CLINICAL DATA:  Dyspnea, abnormal laboratory evaluation, dizziness, dark  stools, history of lung cancer EXAM: CHEST - 2 VIEW COMPARISON:  01/24/2023 FINDINGS: Frontal and lateral views of the chest demonstrates postsurgical changes from median sternotomy and aortic valve replacement. Stable enlargement of the cardiac silhouette. Stable atherosclerosis of the thoracic aorta. Stable small bilateral pleural effusions. Stable postsurgical and post therapeutic changes at the right apex. Background emphysema. No airspace disease or pneumothorax. No acute bony abnormalities. IMPRESSION: 1. Stable enlarged cardiac silhouette. 2. Chronic small bilateral pleural effusions. 3. Stable postsurgical and post therapeutic changes at the right apex consistent with known history of prior lung cancer. 4. Emphysema.  No acute airspace disease. Electronically Signed   By: Sharlet Salina M.D.   On: 09/19/2023 18:53    Microbiology: Results for orders placed or performed during the hospital encounter of 11/14/21  Fungus Culture  With Stain     Status: None   Collection Time: 11/14/21  7:30 AM   Specimen: Bronchial Alveolar Lavage; Respiratory  Result Value Ref Range Status   Fungus Stain Final report  Final   Fungus (Mycology) Culture Final report  Final    Comment: (NOTE) Performed At: [facility] [address] [place], [place] [id number] Jolene Schimke MD YN:[id number]    Fungal Source BRONCHIAL ALVEOLAR LAVAGE  Final    Comment: Performed at [facility], [address] [place], [place] [address]  Culture, Respiratory w Gram Stain     Status: None   Collection Time: 11/14/21  7:30 AM   Specimen: Bronchial Alveolar Lavage; Respiratory  Result Value Ref Range Status   Specimen Description BRONCHIAL ALVEOLAR LAVAGE  Final   Special Requests NONE  Final   Gram Stain   Final    FEW WBC PRESENT,BOTH PMN AND MONONUCLEAR NO ORGANISMS SEEN    Culture   Final    NO GROWTH 2 DAYS Performed at [facility], [address], [place], [place] [address]    Report Status  11/16/2021 FINAL  Final  Aerobic/Anaerobic Culture w Gram Stain (surgical/deep wound)     Status: None   Collection Time: 11/14/21  7:30 AM   Specimen: Bronchial Alveolar Lavage; Respiratory  Result Value Ref Range Status   Specimen Description BRONCHIAL ALVEOLAR LAVAGE  Final   Special Requests NONE  Final   Gram Stain NO WBC SEEN NO ORGANISMS SEEN   Final   Culture   Final    No growth aerobically or anaerobically. Performed at [facility], [address] [place], [place] [address]    Report Status 11/19/2021 FINAL  Final  Acid Fast Culture with reflexed sensitivities     Status: None   Collection Time: 11/14/21  7:30 AM   Specimen: Bronchial Alveolar Lavage; Respiratory  Result Value Ref Range Status   Acid Fast Culture Negative  Final    Comment: (NOTE) No acid fast bacilli isolated after 6 weeks. Performed At: [facility] [address] [place], [place] [id number] Jolene Schimke MD OZ:[id number]    Source of Sample BRONCHIAL ALVEOLAR LAVAGE  Final    Comment: Performed at [facility], [address] [place], [place] [address]  Acid Fast Smear (AFB)     Status: None   Collection Time: 11/14/21  7:30 AM   Specimen: Bronchial Alveolar Lavage; Respiratory  Result Value Ref Range Status   AFB Specimen Processing Concentration  Final   Acid Fast Smear Negative  Final    Comment: (NOTE) Performed At: [facility] [address] [place], [place] [id number] Jolene Schimke MD RJ:[id number]    Source (AFB) BRONCHIAL ALVEOLAR LAVAGE  Final    Comment: Performed at [facility], [address], [place], [place] [address]  Fungus Culture With Stain     Status: None   Collection Time: 11/14/21  7:30 AM   Specimen: Bronchial Alveolar Lavage; Respiratory  Result Value Ref Range Status   Fungus Stain Final report  Final   Fungus (Mycology) Culture Final report  Final    Comment: (NOTE) Performed At: [facility] [address]  [place], [place] [id number] Jolene Schimke MD UK:[id number]    Fungal Source BRONCHIAL ALVEOLAR LAVAGE  Final    Comment: Performed at [facility], [address] [place], [place] [address]  Culture, Respiratory w Gram Stain     Status: None  Collection Time: 11/14/21  7:30 AM   Specimen: Bronchial Alveolar Lavage; Respiratory  Result Value Ref Range Status   Specimen Description BRONCHIAL ALVEOLAR LAVAGE  Final   Special Requests NONE  Final   Gram Stain   Final    RARE WBC PRESENT, PREDOMINANTLY MONONUCLEAR NO ORGANISMS SEEN    Culture   Final    RARE Normal respiratory flora-no Staph aureus or Pseudomonas seen Performed at [facility], [address], [place], [place] [address]    Report Status 11/16/2021 FINAL  Final  Aerobic/Anaerobic Culture w Gram Stain (surgical/deep wound)     Status: None   Collection Time: 11/14/21  7:30 AM   Specimen: Bronchial Alveolar Lavage; Respiratory  Result Value Ref Range Status   Specimen Description BRONCHIAL ALVEOLAR LAVAGE  Final   Special Requests NONE  Final   Gram Stain   Final    RARE WBC PRESENT, PREDOMINANTLY MONONUCLEAR NO ORGANISMS SEEN    Culture   Final    RARE Normal respiratory flora-no Staph aureus or Pseudomonas seen NO ANAEROBES ISOLATED Performed at [facility], [address], [place], [place] [address]    Report Status 11/19/2021 FINAL  Final  Acid Fast Culture with reflexed sensitivities     Status: None   Collection Time: 11/14/21  7:30 AM   Specimen: Bronchial Alveolar Lavage; Respiratory  Result Value Ref Range Status   Acid Fast Culture Negative  Final    Comment: (NOTE) No acid fast bacilli isolated after 6 weeks. Performed At: [facility] [address] [place], [place] [id number] Jolene Schimke MD QM:[id number]    Source of Sample BRONCHIAL ALVEOLAR LAVAGE  Final    Comment: Performed at [facility], [address], [place], [place] [address]  Acid Fast Smear (AFB)      Status: None   Collection Time: 11/14/21  7:30 AM   Specimen: Bronchial Alveolar Lavage; Respiratory  Result Value Ref Range Status   AFB Specimen Processing Concentration  Final   Acid Fast Smear Negative  Final    Comment: (NOTE) Performed At: [facility] [address] [place], [place] [id number] Jolene Schimke MD ZD:[id number]    Source (AFB) BRONCHIAL ALVEOLAR LAVAGE  Final    Comment: Performed at [facility], [address] [place], [place] [address]  Fungus Culture With Stain     Status: None   Collection Time: 11/14/21  7:30 AM   Specimen: Bronchial Alveolar Lavage; Respiratory  Result Value Ref Range Status   Fungus Stain Final report  Final   Fungus (Mycology) Culture Final report  Final    Comment: (NOTE) Performed At: [facility] [address] [place], [place] [id number] Jolene Schimke MD JJ:[id number]    Fungal Source BRONCHIAL ALVEOLAR LAVAGE  Final    Comment: Performed at [facility], [address], [place], [place] [address]  Culture, Respiratory w Gram Stain     Status: None   Collection Time: 11/14/21  7:30 AM   Specimen: Bronchial Alveolar Lavage; Respiratory  Result Value Ref Range Status   Specimen Description BRONCHIAL ALVEOLAR LAVAGE  Final   Special Requests NONE  Final   Gram Stain   Final    RARE WBC PRESENT, PREDOMINANTLY MONONUCLEAR NO ORGANISMS SEEN    Culture   Final    RARE Normal respiratory flora-no Staph aureus or Pseudomonas seen Performed at [facility], [address], [place], [place] [address]    Report  Status 11/17/2021 FINAL  Final  Aerobic/Anaerobic Culture w Gram Stain (surgical/deep wound)     Status: None   Collection Time: 11/14/21  7:30 AM   Specimen: Bronchial Alveolar Lavage; Respiratory  Result Value Ref Range Status   Specimen Description BRONCHIAL ALVEOLAR LAVAGE  Final   Special Requests NONE  Final   Gram Stain   Final    FEW WBC PRESENT, PREDOMINANTLY MONONUCLEAR NO  ORGANISMS SEEN    Culture   Final    RARE Normal respiratory flora-no Staph aureus or Pseudomonas seen NO ANAEROBES ISOLATED Performed at [facility], [address], [place], [place] [address]    Report Status 11/19/2021 FINAL  Final  Acid Fast Culture with reflexed sensitivities     Status: None   Collection Time: 11/14/21  7:30 AM   Specimen: Bronchial Alveolar Lavage; Respiratory  Result Value Ref Range Status   Acid Fast Culture Negative  Final    Comment: (NOTE) No acid fast bacilli isolated after 6 weeks. Performed At: [facility] [address] [place], [place] [id number] Jolene Schimke MD XB:[id number]    Source of Sample BRONCHIAL ALVEOLAR LAVAGE  Final    Comment: Performed at [facility], [address] [place], [place] [address]  Acid Fast Smear (AFB)     Status: None   Collection Time: 11/14/21  7:30 AM   Specimen: Bronchial Alveolar Lavage; Respiratory  Result Value Ref Range Status   AFB Specimen Processing Concentration  Final   Acid Fast Smear Negative  Final    Comment: (NOTE) Performed At: [facility] [address] [place], [place] [id number] Jolene Schimke MD MW:[id number]    Source (AFB) BRONCHIAL ALVEOLAR LAVAGE  Final    Comment: Performed at [facility], [address] [place], [place] [address]  Fungus Culture Result     Status: None   Collection Time: 11/14/21  7:30 AM  Result Value Ref Range Status   Result 1 Comment  Final    Comment: (NOTE) KOH/Calcofluor preparation:  no fungus observed. Performed At: [facility] [address] [place], [place] [id number] Jolene Schimke MD VZ:[id number]   Fungus Culture Result     Status: None   Collection Time: 11/14/21  7:30 AM  Result Value Ref Range Status   Result 1 Comment  Final    Comment: (NOTE) KOH/Calcofluor preparation:  no fungus observed. Performed At: [facility] [address] [place], [place] [id number] Jolene Schimke MD  SA:[id number]   Fungus Culture Result     Status: None   Collection Time: 11/14/21  7:30 AM  Result Value Ref Range Status   Result 1 Comment  Final    Comment: (NOTE) KOH/Calcofluor preparation:  no fungus observed. Performed At: [facility] [address] [place], [place] [id number] Jolene Schimke MD UR:[id number]   Fungal organism reflex     Status: None   Collection Time: 11/14/21  7:30 AM  Result Value Ref Range Status   Fungal result 1 Comment  Final    Comment: (NOTE) No yeast or mold isolated after 4 weeks. Performed At: [facility] [address] [place], [place] [id number] Jolene Schimke MD WV:[id number]   Fungal organism reflex     Status: None   Collection Time: 11/14/21  7:30 AM  Result Value Ref Range Status   Fungal result 1 Comment  Final    Comment: (NOTE) No yeast or mold isolated after 4 weeks. Performed At: [facility] [employer] [address]  [address] [place], [place] [id number] Jolene Schimke MD GL:[id number]   Fungal organism reflex     Status: None   Collection Time: 11/14/21  7:30 AM  Result Value Ref Range Status   Fungal result 1 Comment  Final    Comment: (NOTE) No yeast or mold isolated after 4 weeks. Performed At: [facility] [address] [place], [place] [id number] Jolene Schimke MD TK:[id number]     Labs: CBC: Recent Labs  Lab 09/18/23 1607 09/19/23 1316 09/19/23 1830 09/20/23 0501 09/21/23 0506  WBC 6.4 8.8  --  5.8 5.3  NEUTROABS 5.8 8.0*  --   --   --   HGB 8.9 Repeated and verified X2.* 8.9* 8.6* 7.7* 9.1*  HCT 26.8* 28.1* 27.0* 23.7* 28.4*  MCV 104.0* 106.8*  --  105.8* 102.9*  PLT 196.0 196  --  172 181   Basic Metabolic Panel: Recent Labs  Lab 09/19/23 1316 09/20/23 0501 09/21/23 0506  NA 139 139 138  K 3.3* 4.1 3.8  CL 101 103 103  CO2 27 30 26   GLUCOSE 108* 92 85  BUN 23 18 15   CREATININE 1.06 1.19 1.07  CALCIUM 8.9 8.5* 8.5*   Liver Function Tests: Recent Labs  Lab 09/19/23 1316  09/20/23 0501  AST 22 16  ALT 18 15  ALKPHOS 43 36*  BILITOT 0.7 0.7  PROT 6.3* 5.4*  ALBUMIN 3.9 3.2*   CBG: No results for input(s): "GLUCAP" in the last 168 hours.  Discharge time spent: greater than 30 minutes.  Signed: Alba Cory, MD [facility] 09/21/2023

## 2023-09-21 NOTE — Progress Notes (Signed)
Cardiologist:  Jens Som  Subjective:  Denies SSCP, palpitations or Dyspnea Daughter with him this am Discussed changing to eliquis   Objective:  Vitals:   09/20/23 1242 09/20/23 1548 09/20/23 2016 09/21/23 0554  BP: (!) 142/63 (!) 144/86 (!) 154/73 137/74  Pulse: 75 75 77 62  Resp:  17    Temp: 98.5 F (36.9 C) 99 F (37.2 C) 98 F (36.7 C) 97.7 F (36.5 C)  TempSrc: Oral  Oral Oral  SpO2: 97% 100% 100% 99%  Weight:      Height:        Intake/Output from previous day:  Intake/Output Summary (Last 24 hours) at 09/21/2023 0920 Last data filed at 09/20/2023 1841 Gross per 24 hour  Intake 360 ml  Output --  Net 360 ml    Physical Exam:  Elderly [place] male Poor dentition with recent surgery SEM through AVR no AR Lungs interstitial crackles right base  Abdomen with small umbilical hernia No edema Prior left TKR  Prior lumbar laminectomy Lab Results: Basic Metabolic Panel: Recent Labs    09/20/23 0501 09/21/23 0506  NA 139 138  K 4.1 3.8  CL 103 103  CO2 30 26  GLUCOSE 92 85  BUN 18 15  CREATININE 1.19 1.07  CALCIUM 8.5* 8.5*   Liver Function Tests: Recent Labs    09/19/23 1316 09/20/23 0501  AST 22 16  ALT 18 15  ALKPHOS 43 36*  BILITOT 0.7 0.7  PROT 6.3* 5.4*  ALBUMIN 3.9 3.2*   No results for input(s): "LIPASE", "AMYLASE" in the last 72 hours. CBC: Recent Labs    09/18/23 1607 09/19/23 1316 09/19/23 1830 09/20/23 0501 09/21/23 0506  WBC 6.4 8.8  --  5.8 5.3  NEUTROABS 5.8 8.0*  --   --   --   HGB 8.9 Repeated and verified X2.* 8.9*   < > 7.7* 9.1*  HCT 26.8* 28.1*   < > 23.7* 28.4*  MCV 104.0* 106.8*  --  105.8* 102.9*  PLT 196.0 196  --  172 181   < > = values in this interval not displayed.    Anemia Panel: Recent Labs    09/19/23 1830  VITAMINB12 292  FOLATE 21.5  FERRITIN 85  TIBC 346  IRON 38*  RETICCTPCT 6.2*    Imaging: DG Chest 2 View Result Date: 09/19/2023 CLINICAL DATA:  Dyspnea, abnormal laboratory  evaluation, dizziness, dark stools, history of lung cancer EXAM: CHEST - 2 VIEW COMPARISON:  01/24/2023 FINDINGS: Frontal and lateral views of the chest demonstrates postsurgical changes from median sternotomy and aortic valve replacement. Stable enlargement of the cardiac silhouette. Stable atherosclerosis of the thoracic aorta. Stable small bilateral pleural effusions. Stable postsurgical and post therapeutic changes at the right apex. Background emphysema. No airspace disease or pneumothorax. No acute bony abnormalities. IMPRESSION: 1. Stable enlarged cardiac silhouette. 2. Chronic small bilateral pleural effusions. 3. Stable postsurgical and post therapeutic changes at the right apex consistent with known history of prior lung cancer. 4. Emphysema.  No acute airspace disease. Electronically Signed   By: Sharlet Salina M.D.   On: 09/19/2023 18:53    Cardiac Studies:  ECG: chronic afib   Telemetry:  afib rate controlled   Echo: 12/2022 EF 70-75% normal AVR mean gradient 16 mmhg   Medications:    allopurinol  300 mg Oral Daily   amLODipine  5 mg Oral Daily   vitamin B-12  500 mcg Oral Daily   pantoprazole (PROTONIX) IV  40 mg  Intravenous Q12H   predniSONE  20 mg Oral Q breakfast      Assessment/Plan:  Afib:  chronic rates are fine has severe bi atrial enlargement on TTE  He has had bradycardia and SSS and did not want PPM in past. That is why he is only on norvasc as weak AV nodal drug. Coumadin held. He is refusing EGD and Hb stable this am Discussed with [facility] and DOAC is ok. 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 From bleeding after  dental extraction vs GI  Hb stable 9.1  Ok to d/c on eliquis from my stand point Will arrange f/u with Dr Mollie Germany 09/21/2023, 9:20 AM

## 2023-09-21 NOTE — Progress Notes (Signed)
   09/21/23 1038  TOC Brief Assessment  Insurance and Status Reviewed  Patient has primary care physician Yes  Home environment has been reviewed Single family home  Prior level of function: Independent  Prior/Current Home Services No current home services  Social Drivers of Health Review SDOH reviewed no interventions necessary  Readmission risk has been reviewed Yes  Transition of care needs no transition of care needs at this time

## 2023-09-21 NOTE — Telephone Encounter (Signed)
Pt contacted coumadin clinic to report he was in the hospital with a GI bleed and when d/c he was taken off of warfarin and started on Eliquis. Pt appreciated the care he received at the coumadin clinic. Advised if anything is needed in the future to feel free to contact the coumadin clinic. Pt verbalized understanding.

## 2023-09-21 NOTE — Progress Notes (Signed)
Pt was transitioned from warfarin to Eliquis when leaving hospital. Resolving anticoagulation episodes.

## 2023-09-21 NOTE — TOC Benefit Eligibility Note (Signed)
Transition of Care [facility]) Benefit Eligibility Note    Patient Details  Name: KENO CARAWAY MRN: [id number] Date of Birth: [birth year]       Per pt's [facility] pharmacy pt's out-of-pocket cost for 30 day prescription of  apixaban 5 MG Tabs tablet is $387.   Pt provided with coupon for eliquis by pharmacy staff.       Otelia Santee, LCSW Phone Number: [phone] 09/21/2023, 2:13 PM

## 2023-09-21 NOTE — Progress Notes (Addendum)
Attending physician's note   I have taken a history, reviewed the chart, and examined the patient. I performed a substantive portion of this encounter, including complete performance of at least one of the key components, in conjunction with the APP. I agree with the APP's note, impression, and recommendations with my edits.   Jared Budney, DO, FACG [phone] office          Progress Note   LOS: 0 days   Chief Complaint: Symptomatic anemia, melena   Subjective   Patient states he has not had a bowel movement since he has been admitted.  Denies abdominal pain, nausea, vomiting.  States he feels less dizzy/weak and would like to go home today.  He would also like to eat.  Continues to politely decline EGD.  Family member present.   Objective   Vital signs in last 24 hours: Temp:  [97.7 F (36.5 C)-99 F (37.2 C)] 97.7 F (36.5 C) (01/24 0554) Pulse Rate:  [62-85] 62 (01/24 0554) Resp:  [17] 17 (01/23 1548) BP: (124-154)/(57-86) 137/74 (01/24 0554) SpO2:  [97 %-100 %] 99 % (01/24 0554) Last BM Date : 09/19/23 Last BM recorded by nurses in past 5 days No data recorded  General:   male in no acute distress  Heart:  Regular rate and rhythm; no murmurs Pulm: Clear anteriorly; no wheezing Abdomen: soft, nondistended, normal bowel sounds in all quadrants. Nontender without guarding. No organomegaly appreciated. Extremities:  No edema Neurologic:  Alert and  oriented x4;  No focal deficits.  Psych:  Cooperative. Normal mood and affect.  Intake/Output from previous day: 01/23 0701 - 01/24 0700 In: 360 [P.O.:360] Out: -  Intake/Output this shift: No intake/output data recorded.  Studies/Results: DG Chest 2 View Result Date: 09/19/2023 CLINICAL DATA:  Dyspnea, abnormal laboratory evaluation, dizziness, dark stools, history of lung cancer EXAM: CHEST - 2 VIEW COMPARISON:  01/24/2023 FINDINGS: Frontal and lateral views of the chest demonstrates postsurgical changes  from median sternotomy and aortic valve replacement. Stable enlargement of the cardiac silhouette. Stable atherosclerosis of the thoracic aorta. Stable small bilateral pleural effusions. Stable postsurgical and post therapeutic changes at the right apex. Background emphysema. No airspace disease or pneumothorax. No acute bony abnormalities. IMPRESSION: 1. Stable enlarged cardiac silhouette. 2. Chronic small bilateral pleural effusions. 3. Stable postsurgical and post therapeutic changes at the right apex consistent with known history of prior lung cancer. 4. Emphysema.  No acute airspace disease. Electronically Signed   By: Sharlet Salina M.D.   On: 09/19/2023 18:53    Lab Results: Recent Labs    09/19/23 1316 09/19/23 1830 09/20/23 0501 09/21/23 0506  WBC 8.8  --  5.8 5.3  HGB 8.9* 8.6* 7.7* 9.1*  HCT 28.1* 27.0* 23.7* 28.4*  PLT 196  --  172 181   BMET Recent Labs    09/19/23 1316 09/20/23 0501 09/21/23 0506  NA 139 139 138  K 3.3* 4.1 3.8  CL 101 103 103  CO2 27 30 26   GLUCOSE 108* 92 85  BUN 23 18 15   CREATININE 1.06 1.19 1.07  CALCIUM 8.9 8.5* 8.5*   LFT Recent Labs    09/20/23 0501  PROT 5.4*  ALBUMIN 3.2*  AST 16  ALT 15  ALKPHOS 36*  BILITOT 0.7   PT/INR Recent Labs    09/20/23 0501 09/21/23 0820  LABPROT 19.9* 19.3*  INR 1.7* 1.6*     Scheduled Meds:  allopurinol  300 mg Oral Daily  amLODipine  5 mg Oral Daily   vitamin B-12  500 mcg Oral Daily   pantoprazole (PROTONIX) IV  40 mg Intravenous Q12H   predniSONE  20 mg Oral Q breakfast   Continuous Infusions:   Impression:   Symptomatic anemia Melena  Hgb 9.1 s/p 1 unit PRBCs  BUN 15, CR 1.07 Melena after dental procedure in which she swallowed a significant amount of blood per patient report thought to be secondary from his dental procedure that we cannot definitively rule out upper GI etiology with his significant NSAID history.  Last episode of melena 3 days ago patient politely declining  EGD.  Reassuringly no further bleeding at this time and stable hemoglobin   History of A-fib on Coumadin INR 1.4 Last dose of Coumadin 1/22 AM Cardiology is starting him on Eliquis   Plan:   - Restart anticoagulation (cardiology changed to Eliquis) - High-dose PPI twice daily x 8 weeks due to high risk of ulcer with NSAID history and then daily indefinitely after 8 weeks - Can have soft diet - No further workup from GI standpoint.  Patient continuing to politely decline EGD.  Bayley Leanna Sato  09/21/2023, 9:36 AM

## 2023-09-21 NOTE — Plan of Care (Signed)
  Problem: Education: Goal: Knowledge of General Education information will improve Description: Including pain rating scale, medication(s)/side effects and non-pharmacologic comfort measures 09/21/2023 1333 by Juleen Starr, RN Outcome: Adequate for Discharge 09/21/2023 1133 by Juleen Starr, RN Outcome: Progressing   Problem: Health Behavior/Discharge Planning: Goal: Ability to manage health-related needs will improve 09/21/2023 1333 by Juleen Starr, RN Outcome: Adequate for Discharge 09/21/2023 1133 by Juleen Starr, RN Outcome: Progressing   Problem: Clinical Measurements: Goal: Ability to maintain clinical measurements within normal limits will improve 09/21/2023 1333 by Juleen Starr, RN Outcome: Adequate for Discharge 09/21/2023 1133 by Juleen Starr, RN Outcome: Progressing Goal: Will remain free from infection 09/21/2023 1333 by Juleen Starr, RN Outcome: Adequate for Discharge 09/21/2023 1133 by Juleen Starr, RN Outcome: Progressing Goal: Diagnostic test results will improve 09/21/2023 1333 by Juleen Starr, RN Outcome: Adequate for Discharge 09/21/2023 1133 by Juleen Starr, RN Outcome: Progressing Goal: Respiratory complications will improve 09/21/2023 1333 by Juleen Starr, RN Outcome: Adequate for Discharge 09/21/2023 1133 by Juleen Starr, RN Outcome: Progressing Goal: Cardiovascular complication will be avoided 09/21/2023 1333 by Juleen Starr, RN Outcome: Adequate for Discharge 09/21/2023 1133 by Juleen Starr, RN Outcome: Progressing   Problem: Activity: Goal: Risk for activity intolerance will decrease 09/21/2023 1333 by Juleen Starr, RN Outcome: Adequate for Discharge 09/21/2023 1133 by Juleen Starr, RN Outcome: Progressing   Problem: Nutrition: Goal: Adequate nutrition will be maintained 09/21/2023 1333 by Juleen Starr, RN Outcome: Adequate for Discharge 09/21/2023 1133 by Juleen Starr, RN Outcome: Progressing   Problem: Coping: Goal: Level of anxiety will  decrease 09/21/2023 1333 by Juleen Starr, RN Outcome: Adequate for Discharge 09/21/2023 1133 by Juleen Starr, RN Outcome: Progressing   Problem: Elimination: Goal: Will not experience complications related to bowel motility 09/21/2023 1333 by Juleen Starr, RN Outcome: Adequate for Discharge 09/21/2023 1133 by Juleen Starr, RN Outcome: Progressing Goal: Will not experience complications related to urinary retention 09/21/2023 1333 by Juleen Starr, RN Outcome: Adequate for Discharge 09/21/2023 1133 by Juleen Starr, RN Outcome: Progressing   Problem: Pain Managment: Goal: General experience of comfort will improve and/or be controlled 09/21/2023 1333 by Juleen Starr, RN Outcome: Adequate for Discharge 09/21/2023 1133 by Juleen Starr, RN Outcome: Progressing   Problem: Safety: Goal: Ability to remain free from injury will improve 09/21/2023 1333 by Juleen Starr, RN Outcome: Adequate for Discharge 09/21/2023 1133 by Juleen Starr, RN Outcome: Progressing   Problem: Skin Integrity: Goal: Risk for impaired skin integrity will decrease 09/21/2023 1333 by Juleen Starr, RN Outcome: Adequate for Discharge 09/21/2023 1133 by Juleen Starr, RN Outcome: Progressing

## 2023-09-21 NOTE — Plan of Care (Signed)

## 2023-09-21 NOTE — Telephone Encounter (Signed)
TOC per Dr. Eden Emms scheduled for 10/02/23 at 10:55am with Bernadene Person, NP

## 2023-09-21 NOTE — Plan of Care (Signed)

## 2023-09-21 NOTE — Progress Notes (Signed)
PHARMACY - ANTICOAGULATION CONSULT NOTE  Pharmacy Consult for apixaban Indication: atrial fibrillation  No Known Allergies  Patient Measurements: Height: 5\' 6"  (167.6 cm) Weight: 72.6 kg (160 lb) IBW/kg (Calculated) : 63.8   Vital Signs: Temp: 97.7 F (36.5 C) (01/24 0554) Temp Source: Oral (01/24 0554) BP: 137/74 (01/24 0554) Pulse Rate: 62 (01/24 0554)  Labs: Recent Labs    09/19/23 1316 09/19/23 1830 09/20/23 0501 09/21/23 0506 09/21/23 0820  HGB 8.9* 8.6* 7.7* 9.1*  --   HCT 28.1* 27.0* 23.7* 28.4*  --   PLT 196  --  172 181  --   APTT 27  --   --   --   --   LABPROT 17.1*  --  19.9*  --  19.3*  INR 1.4*  --  1.7*  --  1.6*  CREATININE 1.06  --  1.19 1.07  --     Estimated Creatinine Clearance: 45.5 mL/min (by C-G formula based on SCr of 1.07 mg/dL).   Medical History: Past Medical History:  Diagnosis Date   Aortic stenosis    Atrial fibrillation (HCC)    Baker cyst    Left knee   DJD (degenerative joint disease)    knees   Dysrhythmia    Eczema    Erectile dysfunction    Gout    History of hiatal hernia    History of radiation therapy    Right lung- 04/21/21-05/03/21 Dr. Antony Blackbird   HTN (hypertension)    MVP (mitral valve prolapse)    Shortness of breath dyspnea    with activity    Medications:  Medications Prior to Admission  Medication Sig Dispense Refill Last Dose/Taking   allopurinol (ZYLOPRIM) 300 MG tablet Take 1 tablet by mouth once daily (Patient taking differently: Take 300 mg by mouth in the morning.) 90 tablet 1 09/19/2023   amLODipine (NORVASC) 5 MG tablet Take 1 tablet by mouth once daily (Patient taking differently: Take 5 mg by mouth in the morning.) 90 tablet 1 09/19/2023   furosemide (LASIX) 20 MG tablet Take 3 tablets (60 mg total) by mouth daily. (Patient taking differently: Take 60 mg by mouth in the morning.) 270 tablet 3 09/19/2023   ibuprofen (ADVIL) 200 MG tablet Take 400 mg by mouth in the morning and at bedtime.    09/19/2023   potassium chloride SA (KLOR-CON M) 20 MEQ tablet Take 1 tablet (20 mEq total) by mouth daily. (Patient taking differently: Take 20 mEq by mouth in the morning.) 90 tablet 3 09/19/2023   predniSONE (DELTASONE) 20 MG tablet Take 1 tablet by mouth once daily with breakfast 90 tablet 0 09/19/2023   tamsulosin (FLOMAX) 0.4 MG CAPS capsule TAKE 1 CAPSULE BY MOUTH ONCE DAILY AFTER BREAKFAST (Patient taking differently: Take 0.4 mg by mouth daily after breakfast.) 90 capsule 0 09/19/2023   traMADol (ULTRAM) 50 MG tablet TAKE 1 TABLET BY MOUTH EVERY 12 HOURS AS NEEDED 60 tablet 0 09/19/2023   traZODone (DESYREL) 50 MG tablet TAKE 1 TABLET BY MOUTH AT BEDTIME AS NEEDED 90 tablet 0 09/18/2023   triamcinolone cream (KENALOG) 0.1 % APPLY  CREAM EXTERNALLY TO AFFECTED AREA TWICE DAILY (Patient taking differently: Apply 1 Application topically 2 (two) times daily as needed.) 80 g 0 Past Week   VITAMIN D PO Take 1 tablet by mouth in the morning.   09/19/2023   warfarin (COUMADIN) 5 MG tablet TAKE 1/2 TABLET BY MOUTH  DAILY EXCEPT TAKE 1 TABLET ON WEDNESDAY OR AS  DIRECTED  BY  ANTICOAGULATION  CLINIC (Patient taking differently: Take 2.5-5 mg by mouth See admin instructions. Take 0.5 tablet every morning  except take 1 tablet on Wednesday or as directed by anticoagulation clinic.) 75 tablet 1 09/19/2023    Assessment: Pharmacy is consulted to dose apixaban in 88 yo male with atrial fibrillation. Pt had been on warfarin but that medication is now being stopped and being switched to apixaban due to lower GI bleed risk.   Today, 09/21/23 INR down to 1.6  Hgb 9.1, plt 181  Scr 1.07 mg/dl, Crcl 45 ml/min    Goal of Therapy:  Monitor platelets by anticoagulation protocol: Yes   Plan:  Apixaban 5 mg PO BID  Monitor SCr, CBC  Monitor for signs and symptoms of bleeding    Adalberto Cole, PharmD, BCPS 09/21/2023 9:40 AM

## 2023-09-24 ENCOUNTER — Telehealth: Payer: Self-pay | Admitting: Cardiology

## 2023-09-24 NOTE — Telephone Encounter (Signed)
Pt reports he was d/c from hospital for GI bleed on 1/24 and was advised to switch from warfarin to Eliquis. He reports the only reason he had blood in his stool is due to having 3 teeth removed which bled for over 24 hours. Hospital wanted to do an endoscopy but pt refused. They stopped pt's warfarin in the hospital. He was discharged with instructions to not take anymore warfarin but to start Eliquis. Eliquis will cost $380/month and pt reports he cannot afford that. He reports he wants to go back on warfarin.  Pt reports he restarted warfarin today at prior 1/2 tablet dose. Advised pt to watch for s/s of abnormal bruising or bleeding and if any to go to ER. Scheduled pt for INR check for 1/31. Advised a msg would be sent to cardiologist and PCP with update. Pt verbalized understanding.

## 2023-09-24 NOTE — Telephone Encounter (Signed)
Patient stated he wants a call back to discuss reason for visit scheduled on 2/4 as he said he did not request this visit.

## 2023-09-24 NOTE — Telephone Encounter (Signed)
Called and spoke to patient. Verified name and DOB. Inform patient appt was made at discharge form hospital stay. Advised patient the appointment is to follow up after hospital admission. Patient verbalized understanding and agree.

## 2023-09-28 ENCOUNTER — Ambulatory Visit (INDEPENDENT_AMBULATORY_CARE_PROVIDER_SITE_OTHER): Payer: Medicare Other

## 2023-09-28 DIAGNOSIS — Z7901 Long term (current) use of anticoagulants: Secondary | ICD-10-CM | POA: Diagnosis not present

## 2023-09-28 LAB — POCT INR: INR: 1.5 — AB (ref 2.0–3.0)

## 2023-09-28 NOTE — Patient Instructions (Addendum)
Pre visit review using our clinic review tool, if applicable. No additional management support is needed unless otherwise documented below in the visit note.  Increase dose today to take 1 tablet and then continue 1/2 tablet daily except take 1 tablet on Wednesday. Recheck in 1 week.

## 2023-09-28 NOTE — Progress Notes (Signed)
Pt reports he was d/c from hospital for GI bleed on 1/24 and was advised to switch from warfarin to Eliquis. He reports the only reason he had blood in his stool is due to having 3 teeth removed which bled for over 24 hours. Hospital wanted to do an endoscopy but pt refused. They stopped pt's warfarin in the hospital. He was discharged with instructions to not take anymore warfarin but to start Eliquis. Eliquis will cost $380/month and pt reports he cannot afford that. He reports he wants to go back on warfarin.  Pt reports he restarted warfarin on 1/24 at prior 1/2 tablet dose. Advised pt to watch for s/s of abnormal bruising or bleeding and if any to go to ER. Scheduled pt for INR check for 1/31. Advised a msg would be sent to cardiologist and PCP with update. Pt verbalized understanding.  Increase dose today to take 1 tablet and then continue 1/2 tablet daily except take 1 tablet on Wednesday. Recheck in 1 week.

## 2023-10-02 ENCOUNTER — Encounter: Payer: Self-pay | Admitting: Nurse Practitioner

## 2023-10-02 ENCOUNTER — Ambulatory Visit: Payer: Medicare Other | Attending: Nurse Practitioner | Admitting: Nurse Practitioner

## 2023-10-02 VITALS — BP 118/62 | HR 92

## 2023-10-02 DIAGNOSIS — Z952 Presence of prosthetic heart valve: Secondary | ICD-10-CM | POA: Diagnosis not present

## 2023-10-02 DIAGNOSIS — Z8719 Personal history of other diseases of the digestive system: Secondary | ICD-10-CM

## 2023-10-02 DIAGNOSIS — I6523 Occlusion and stenosis of bilateral carotid arteries: Secondary | ICD-10-CM | POA: Diagnosis not present

## 2023-10-02 DIAGNOSIS — I10 Essential (primary) hypertension: Secondary | ICD-10-CM | POA: Diagnosis not present

## 2023-10-02 DIAGNOSIS — C3491 Malignant neoplasm of unspecified part of right bronchus or lung: Secondary | ICD-10-CM

## 2023-10-02 DIAGNOSIS — I5032 Chronic diastolic (congestive) heart failure: Secondary | ICD-10-CM | POA: Diagnosis not present

## 2023-10-02 MED ORDER — WARFARIN SODIUM 5 MG PO TABS: ORAL_TABLET | ORAL | 1 refills | Status: DC

## 2023-10-02 NOTE — Patient Instructions (Signed)
 Medication Instructions:  Stop Eliquis  as directed. Restart Warfarin 5 mg on Wednesdays and 2.5 mg mg on the other days.  *If you need a refill on your cardiac medications before your next appointment, please call your pharmacy*   Lab Work: NONE ordered at this time of appointment    Testing/Procedures: NONE ordered at this time of appointment     Follow-Up: At [facility], you and your health needs are our priority.  As part of our continuing mission to provide you with exceptional heart care, we have created designated Provider Care Teams.  These Care Teams include your primary Cardiologist (physician) and Advanced Practice Providers (APPs -  Physician Assistants and Nurse Practitioners) who all work together to provide you with the care you need, when you need it.  We recommend signing up for the patient portal called MyChart.  Sign up information is provided on this After Visit Summary.  MyChart is used to connect with patients for Virtual Visits (Telemedicine).  Patients are able to view lab/test results, encounter notes, upcoming appointments, etc.  Non-urgent messages can be sent to your provider as well.   To learn more about what you can do with MyChart, go to [email or web address].    Your next appointment:    Keep follow up   Provider:   Redell Shallow, MD

## 2023-10-02 NOTE — Progress Notes (Signed)
 Office Visit    Patient Name: [name] Date of Encounter: 10/02/2023  Primary Care Provider:  Theophilus Andrews, Tully GRADE, MD Primary Cardiologist:  Redell Shallow, MD  Chief Complaint    88 year old male with a history of aortic stenosis s/p AVR with pericardial tissue valve, thoracic aortic aneurysm s/p repair in 02/2005, permanent atrial fibrillation, bradycardia, chronic diastolic heart failure, carotid artery stenosis, hypertension, GI bleed and lung cancer who presents for hospital follow-up related to atrial fibrillation.  Past Medical History    Past Medical History:  Diagnosis Date   Aortic stenosis    Atrial fibrillation (HCC)    Baker cyst    Left knee   DJD (degenerative joint disease)    knees   Dysrhythmia    Eczema    Erectile dysfunction    Gout    History of hiatal hernia    History of radiation therapy    Right lung- 04/21/21-05/03/21 Dr. Lynwood Nasuti   HTN (hypertension)    MVP (mitral valve prolapse)    Shortness of breath dyspnea    with activity   Past Surgical History:  Procedure Laterality Date    polyps vocal cord  1994   AORTIC VALVE REPLACEMENT  ~2004   BRONCHIAL BIOPSY  03/07/2021   Procedure: BRONCHIAL BIOPSIES;  Surgeon: Shelah Lamar RAMAN, MD;  Location: [facility];  Service: Pulmonary;;   BRONCHIAL BIOPSY  11/14/2021   Procedure: BRONCHIAL BIOPSIES;  Surgeon: Shelah Lamar RAMAN, MD;  Location: [facility];  Service: Pulmonary;;   BRONCHIAL BRUSHINGS  03/07/2021   Procedure: BRONCHIAL BRUSHINGS;  Surgeon: Shelah Lamar RAMAN, MD;  Location: [facility];  Service: Pulmonary;;   BRONCHIAL BRUSHINGS  11/14/2021   Procedure: BRONCHIAL BRUSHINGS;  Surgeon: Shelah Lamar RAMAN, MD;  Location: [facility];  Service: Pulmonary;;   BRONCHIAL NEEDLE ASPIRATION BIOPSY  03/07/2021   Procedure: BRONCHIAL NEEDLE ASPIRATION BIOPSIES;  Surgeon: Shelah Lamar RAMAN, MD;  Location: [facility];  Service: Pulmonary;;   BRONCHIAL NEEDLE ASPIRATION BIOPSY  11/14/2021    Procedure: BRONCHIAL NEEDLE ASPIRATION BIOPSIES;  Surgeon: Shelah Lamar RAMAN, MD;  Location: [facility];  Service: Pulmonary;;   BRONCHIAL WASHINGS  09/21/2020   Procedure: BRONCHIAL WASHINGS;  Surgeon: Kara Dorn NOVAK, MD;  Location: [facility];  Service: Pulmonary;;  BAL    BRONCHIAL WASHINGS  03/07/2021   Procedure: BRONCHIAL WASHINGS;  Surgeon: Shelah Lamar RAMAN, MD;  Location: [facility];  Service: Pulmonary;;   BRONCHIAL WASHINGS  11/14/2021   Procedure: BRONCHIAL WASHINGS;  Surgeon: Shelah Lamar RAMAN, MD;  Location: [facility];  Service: Pulmonary;;   FIDUCIAL MARKER PLACEMENT  03/07/2021   Procedure: FIDUCIAL MARKER PLACEMENT;  Surgeon: Shelah Lamar RAMAN, MD;  Location: [facility];  Service: Pulmonary;;   HERNIA REPAIR  [phone]   HERNIA REPAIR  ~1983   IR ANGIOGRAM PELVIS SELECTIVE OR SUPRASELECTIVE  12/01/2022   IR ANGIOGRAM PELVIS SELECTIVE OR SUPRASELECTIVE  12/01/2022   IR ANGIOGRAM SELECTIVE EACH ADDITIONAL VESSEL  12/01/2022   IR ANGIOGRAM SELECTIVE EACH ADDITIONAL VESSEL  12/01/2022   IR CT SPINE LTD  12/01/2022   IR CT SPINE LTD  12/01/2022   IR CT SPINE LTD  12/01/2022   IR EMBO ARTERIAL NOT HEMORR HEMANG INC GUIDE ROADMAPPING  12/01/2022   IR EMBO TUMOR ORGAN ISCHEMIA INFARCT INC GUIDE ROADMAPPING  12/01/2022   IR RADIOLOGIST EVAL & MGMT  11/17/2022   IR US  GUIDE VASC ACCESS LEFT  12/01/2022   IR US  GUIDE VASC ACCESS RIGHT  12/01/2022  LUMBAR LAMINECTOMY/DECOMPRESSION MICRODISCECTOMY N/A 01/15/2015   Procedure: 2 LEVEL DECOMPRESSIVE LUMBAR LAMINECTOMY L3-L4,L4-L5;  Surgeon: Tanda Heading, MD;  Location: [facility];  Service: Orthopedics;  Laterality: N/A;   LUMBAR LAMINECTOMY/DECOMPRESSION MICRODISCECTOMY N/A 05/01/2017   Procedure: Lumbar one-Lumbar two, Lumbar two-Lumbar three, Lumbar three-Lumbar four Laminectomy; Evacuation of hematoma;  Surgeon: Lanis Pupa, MD;  Location: [facility];  Service: Neurosurgery;  Laterality: N/A;   right total hip  ~4 years ago   ROTATOR CUFF REPAIR Left ~2009    TOTAL KNEE ARTHROPLASTY Left 04/14/2015   Procedure: LEFT TOTAL KNEE ARTHROPLASTY;  Surgeon: Tanda Heading, MD;  Location: [facility];  Service: Orthopedics;  Laterality: Left;   VIDEO BRONCHOSCOPY N/A 09/21/2020   Procedure: VIDEO BRONCHOSCOPY WITHOUT FLUORO;  Surgeon: Kara Dorn NOVAK, MD;  Location: [facility];  Service: Pulmonary;  Laterality: N/A;   VIDEO BRONCHOSCOPY WITH ENDOBRONCHIAL NAVIGATION Right 03/07/2021   Procedure: VIDEO BRONCHOSCOPY WITH ENDOBRONCHIAL NAVIGATION;  Surgeon: Shelah Lamar RAMAN, MD;  Location: [facility];  Service: Pulmonary;  Laterality: Right;   VIDEO BRONCHOSCOPY WITH RADIAL ENDOBRONCHIAL ULTRASOUND  11/14/2021   Procedure: VIDEO BRONCHOSCOPY WITH RADIAL ENDOBRONCHIAL ULTRASOUND;  Surgeon: Shelah Lamar RAMAN, MD;  Location: [facility];  Service: Pulmonary;;    Allergies  No Known Allergies   Labs/Other Studies Reviewed    The following studies were reviewed today:  Cardiac Studies & Procedures      ECHOCARDIOGRAM  ECHOCARDIOGRAM COMPLETE 01/19/2023  Narrative ECHOCARDIOGRAM REPORT    Patient Name:   [name] Date of Exam: 01/19/2023 Medical Rec #:  [id number]       Height:       66.0 in Accession #:    [id number]      Weight:       167.0 lb Date of Birth:  1936/04/18        BSA:          1.852 m Patient Age:    87 years        BP:           132/70 mmHg Patient Gender: M               HR:           82 bpm. Exam Location:  [address]  Procedure: 2D Echo, Cardiac Doppler, Color Doppler and 3D Echo  Indications:    S/P Aortic Valve replacement Z95.2  History:        Patient has prior history of Echocardiogram examinations, most recent 01/19/2021. Arrythmias:Atrial Fibrillation; Risk Factors:Hypertension. Aortic Valve: bioprosthetic valve is present in the aortic position. Procedure Date: 02/2005.  Sonographer:    Augustin Seals RDCS Referring Phys: [id number] LEONOR HERO MEIER  IMPRESSIONS   1. S/P AVR with mean gradient 16 mmHg  and mild AI. 2. Left ventricular ejection fraction, by estimation, is 70 to 75%. The left ventricle has hyperdynamic function. The left ventricle has no regional wall motion abnormalities. There is severe left ventricular hypertrophy. Left ventricular diastolic parameters are indeterminate. 3. Right ventricular systolic function is normal. The right ventricular size is normal. There is mildly elevated pulmonary artery systolic pressure. 4. Left atrial size was severely dilated. 5. Right atrial size was severely dilated. 6. The mitral valve is normal in structure. Mild mitral valve regurgitation. No evidence of mitral stenosis. 7. The aortic valve has been repaired/replaced. Aortic valve regurgitation is mild. No aortic stenosis is present. There is a bioprosthetic valve present in the aortic position. Procedure Date: 02/2005. 8. The inferior vena cava  is normal in size with greater than 50% respiratory variability, suggesting right atrial pressure of 3 mmHg.  FINDINGS Left Ventricle: Left ventricular ejection fraction, by estimation, is 70 to 75%. The left ventricle has hyperdynamic function. The left ventricle has no regional wall motion abnormalities. The left ventricular internal cavity size was normal in size. There is severe left ventricular hypertrophy. Left ventricular diastolic parameters are indeterminate.  Right Ventricle: The right ventricular size is normal. Right ventricular systolic function is normal. There is mildly elevated pulmonary artery systolic pressure. The tricuspid regurgitant velocity is 2.98 m/s, and with an assumed right atrial pressure of 3 mmHg, the estimated right ventricular systolic pressure is 38.5 mmHg.  Left Atrium: Left atrial size was severely dilated.  Right Atrium: Right atrial size was severely dilated.  Pericardium: There is no evidence of pericardial effusion.  Mitral Valve: The mitral valve is normal in structure. Mild mitral valve regurgitation. No  evidence of mitral valve stenosis.  Tricuspid Valve: The tricuspid valve is normal in structure. Tricuspid valve regurgitation is mild . No evidence of tricuspid stenosis.  Aortic Valve: The aortic valve has been repaired/replaced. Aortic valve regurgitation is mild. Aortic regurgitation PHT measures 461 msec. No aortic stenosis is present. Aortic valve mean gradient measures 16.0 mmHg. Aortic valve peak gradient measures 28.1 mmHg. Aortic valve area, by VTI measures 1.13 cm. There is a bioprosthetic valve present in the aortic position. Procedure Date: 02/2005.  Pulmonic Valve: The pulmonic valve was normal in structure. Pulmonic valve regurgitation is mild. No evidence of pulmonic stenosis.  Aorta: The aortic root is normal in size and structure.  Venous: The inferior vena cava is normal in size with greater than 50% respiratory variability, suggesting right atrial pressure of 3 mmHg.  IAS/Shunts: No atrial level shunt detected by color flow Doppler.  Additional Comments: S/P AVR with mean gradient 16 mmHg and mild AI.   LEFT VENTRICLE PLAX 2D LVIDd:         5.10 cm LVIDs:         3.60 cm LV PW:         1.20 cm LV IVS:        1.20 cm LVOT diam:     2.20 cm   3D Volume EF: LV SV:         67        3D EF:        56 % LV SV Index:   36        LV EDV:       129 ml LVOT Area:     3.80 cm  LV ESV:       56 ml LV SV:        73 ml  RIGHT VENTRICLE RV Basal diam:  3.70 cm RV Mid diam:    2.20 cm RV S prime:     5.68 cm/s TAPSE (M-mode): 1.7 cm  LEFT ATRIUM              Index        RIGHT ATRIUM           Index LA diam:        4.80 cm  2.59 cm/m   RA Area:     38.30 cm LA Vol (A2C):   157.0 ml 84.76 ml/m  RA Volume:   148.00 ml 79.90 ml/m LA Vol (A4C):   95.7 ml  51.67 ml/m LA Biplane Vol: 125.0 ml 67.48 ml/m AORTIC VALVE AV Area (  Vmax):    1.17 cm AV Area (Vmean):   1.11 cm AV Area (VTI):     1.13 cm AV Vmax:           265.00 cm/s AV Vmean:          189.000 cm/s AV  VTI:            0.590 m AV Peak Grad:      28.1 mmHg AV Mean Grad:      16.0 mmHg LVOT Vmax:         81.40 cm/s LVOT Vmean:        55.400 cm/s LVOT VTI:          0.175 m LVOT/AV VTI ratio: 0.30 AI PHT:            461 msec  AORTA Ao Root diam: 3.10 cm Ao Asc diam:  3.30 cm  TRICUSPID VALVE TR Peak grad:   35.5 mmHg TR Vmax:        298.00 cm/s  SHUNTS Systemic VTI:  0.18 m Systemic Diam: 2.20 cm  Redell Shallow MD Electronically signed by Redell Shallow MD Signature Date/Time: 01/19/2023/4:10:01 PM    Final   MONITORS  CARDIAC EVENT MONITOR 01/01/2018          Recent Labs: 10/26/2022: TSH 1.18 01/15/2023: Pro B Natriuretic peptide (BNP) 521.0 06/25/2023: BNP 352.7 09/20/2023: ALT 15 09/21/2023: BUN 15; Creatinine, Ser 1.07; Hemoglobin 9.1; Platelets 181; Potassium 3.8; Sodium 138  Recent Lipid Panel    Component Value Date/Time   CHOL 208 (H) 10/26/2022 1051   TRIG 145.0 10/26/2022 1051   HDL 50.50 10/26/2022 1051   CHOLHDL 4 10/26/2022 1051   VLDL 29.0 10/26/2022 1051   LDLCALC 128 (H) 10/26/2022 1051    History of Present Illness    88 year old male with the above past medical history including aortic stenosis s/p AVR with pericardial tissue valve, thoracic aortic aneurysm s/p repair in 02/2005, permanent atrial fibrillation, bradycardia, chronic diastolic heart failure, carotid artery stenosis, hypertension, GI bleed, and lung cancer.  Preoperative cardiac catheterization revealed normal coronary arteries.  Previous carotid Dopplers in the setting of carotid bruit revealed 0 to 39% bilateral carotid artery stenosis.  He has a history of permanent atrial fibrillation with bradycardia, prior cardiac monitor revealed pauses greater than 3 seconds.  He was referred to EP consideration of pacemaker, however, patient declined EP evaluation. Echocardiogram in May 2024 showed normal LV function, prior aortic valve replacement with mean gradient 16 mmHg, mild aortic  insufficiency, severe LVH, severe biatrial enlargement, mild mitral valve regurgitation.  He was last seen in the office on 06/25/2023 and was stable overall from a cardiac standpoint.  He did note some mild dyspnea on exertion.  Lasix  was increased to 80 mg daily x 3 days followed by resumption of previous dose.  He was hospitalized in January 2025 setting of GI bleed.  Hemoglobin was 8.9.  He had a dental procedure 5 days prior and had 3 cracked teeth removed with some postoperative bleeding.  He also reported dark stools.  GI was consulted.  He declined endoscopy.  He was treated empirically with Protonix .  He reported having taken ibuprofen .  Cardiology was consulted for management of anticoagulation.  He was switched from warfarin to Eliquis .  He was discharged home in stable condition on 09/21/2023.  Following discharge, patient reported inability to afford Eliquis  and resumed warfarin.  He presents today for follow-up.  Since his last visit and since his recent hospitalization  he has been stable from a cardiac standpoint.  He has resumed his Coumadin .  He is taking 5 mg on Wednesdays and 2.5 mg every other day, INR was 1.5 on 09/28/2023.  He has follow-up with the Coumadin  clinic this Friday for INR recheck.  He denies any bleeding, denies chest pain, palpitations, dizziness, dyspnea, edema, PND, orthopnea, weight gain.  HR has been well-controlled.  Overall, he reports feeling well.  Home Medications    Current Outpatient Medications  Medication Sig Dispense Refill   allopurinol  (ZYLOPRIM ) 300 MG tablet Take 1 tablet by mouth once daily (Patient taking differently: Take 300 mg by mouth in the morning.) 90 tablet 1   amLODipine  (NORVASC ) 5 MG tablet Take 1 tablet by mouth once daily (Patient taking differently: Take 5 mg by mouth in the morning.) 90 tablet 1   cyanocobalamin  (VITAMIN B12) 500 MCG tablet Take 1 tablet (500 mcg total) by mouth daily. (Patient taking differently: Take 500 mcg by mouth  daily. BID) 30 tablet 0   furosemide  (LASIX ) 20 MG tablet Take 3 tablets (60 mg total) by mouth daily. (Patient taking differently: Take 60 mg by mouth in the morning.) 270 tablet 3   potassium chloride  SA (KLOR-CON  M) 20 MEQ tablet Take 1 tablet (20 mEq total) by mouth daily. (Patient taking differently: Take 20 mEq by mouth in the morning.) 90 tablet 3   predniSONE  (DELTASONE ) 5 MG tablet Take 3 tablets (15 mg total) by mouth daily with breakfast. 90 tablet 0   tamsulosin  (FLOMAX ) 0.4 MG CAPS capsule TAKE 1 CAPSULE BY MOUTH ONCE DAILY AFTER BREAKFAST (Patient taking differently: Take 0.4 mg by mouth daily after breakfast.) 90 capsule 0   traMADol  (ULTRAM ) 50 MG tablet TAKE 1 TABLET BY MOUTH EVERY 12 HOURS AS NEEDED 60 tablet 0   traZODone  (DESYREL ) 50 MG tablet TAKE 1 TABLET BY MOUTH AT BEDTIME AS NEEDED 90 tablet 0   triamcinolone  cream (KENALOG ) 0.1 % APPLY  CREAM EXTERNALLY TO AFFECTED AREA TWICE DAILY (Patient taking differently: Apply 1 Application topically 2 (two) times daily as needed.) 80 g 0   VITAMIN D  PO Take 1 tablet by mouth in the morning.     warfarin (COUMADIN ) 5 MG tablet Take 5 mg on Wednesdays and 2.5 mg on the other days 16 tablet 1   pantoprazole  (PROTONIX ) 40 MG tablet Take 1 tablet (40 mg total) by mouth 2 (two) times daily. (Patient not taking: Reported on 10/02/2023) 60 tablet 1   No current facility-administered medications for this visit.     Review of Systems    He denies chest pain, palpitations, dyspnea, pnd, orthopnea, n, v, dizziness, syncope, edema, weight gain, or early satiety. All other systems reviewed and are otherwise negative except as noted above.   Physical Exam    VS:  BP 118/62 (BP Location: Left Arm, Patient Position: Sitting)   Pulse 92   SpO2 97%   GEN: Well nourished, well developed, in no acute distress. HEENT: normal. Neck: Supple, no JVD, carotid bruits, or masses. Cardiac: RRR, no murmurs, rubs, or gallops. No clubbing, cyanosis, edema.   Radials/DP/PT 2+ and equal bilaterally.  Respiratory:  Respirations regular and unlabored, clear to auscultation bilaterally. GI: Soft, nontender, nondistended, BS + x 4. MS: no deformity or atrophy. Skin: warm and dry, no rash. Neuro:  Strength and sensation are intact. Psych: Normal affect.  Accessory Clinical Findings    ECG personally reviewed by me today - EKG Interpretation Date/Time:  Tuesday October 02 2023 11:06:44 EST Ventricular Rate:  90 PR Interval:    QRS Duration:  150 QT Interval:  408 QTC Calculation: 499 R Axis:   -1  Text Interpretation: Atrial fibrillation Right bundle branch block Inferior infarct , age undetermined When compared with ECG of 19-Sep-2023 13:10, PREVIOUS ECG IS PRESENT Confirmed by Daneen Perkins ([id number]) on 10/02/2023 11:35:18 AM  - no acute changes.   Lab Results  Component Value Date   WBC 5.3 09/21/2023   HGB 9.1 (L) 09/21/2023   HCT 28.4 (L) 09/21/2023   MCV 102.9 (H) 09/21/2023   PLT 181 09/21/2023   Lab Results  Component Value Date   CREATININE 1.07 09/21/2023   BUN 15 09/21/2023   NA 138 09/21/2023   K 3.8 09/21/2023   CL 103 09/21/2023   CO2 26 09/21/2023   Lab Results  Component Value Date   ALT 15 09/20/2023   AST 16 09/20/2023   ALKPHOS 36 (L) 09/20/2023   BILITOT 0.7 09/20/2023   Lab Results  Component Value Date   CHOL 208 (H) 10/26/2022   HDL 50.50 10/26/2022   LDLCALC 128 (H) 10/26/2022   TRIG 145.0 10/26/2022   CHOLHDL 4 10/26/2022    Lab Results  Component Value Date   HGBA1C 5.4 10/25/2021    Assessment & Plan    1. Permanent atrial fibrillation/bradycardia: Rate controlled.  He was recently transitioned from warfarin to Eliquis  in the setting of GI bleed.  He was unable to afford Eliquis  and resumed warfarin.  Following with Coumadin  clinic.  INR was 1.5 on 09/28/2023.  He has follow-up scheduled this Friday.  2. Aortic stenosis: S/p AVR with pericardial tissue valve. Echocardiogram in May 2024 showed  normal LV function, prior aortic valve replacement with mean gradient 16 mmHg, mild aortic insufficiency, severe LVH, severe biatrial enlargement, mild mitral valve regurgitation. Euvolemic and well compensated on exam. Continue SBE prophylaxis.   3. Thoracic aortic aneurysm: S/p repair in 2006.  No evidence of aortic dilation on most recent echo in 12/2022.  4. Chronic diastolic heart failure: Most recent echo as above.  Euvolemic and well compensated on exam.  Continue Lasix .  5. Carotid artery stenosis: Asymptomatic. Consider repeat ultrasound as clinically indicated.  6. Hypertension: BP well controlled. Continue current antihypertensive regimen.   7. GI bleed: Occurred following dental work. Denies bleeding.   8. Lung cancer: Non-small cell post SBRT 2022, negative bronchoscopy in March 2023.  Follows with oncology.  9. Disposition: Follow-up as scheduled with Dr. Pietro in 12/2023.       Perkins JAYSON Daneen, NP 10/02/2023, 1:17 PM

## 2023-10-03 ENCOUNTER — Encounter: Payer: Self-pay | Admitting: Internal Medicine

## 2023-10-03 ENCOUNTER — Ambulatory Visit: Payer: Medicare Other | Admitting: Internal Medicine

## 2023-10-03 VITALS — BP 120/70 | HR 80 | Temp 97.4°F | Wt 167.8 lb

## 2023-10-03 DIAGNOSIS — D649 Anemia, unspecified: Secondary | ICD-10-CM

## 2023-10-03 DIAGNOSIS — K921 Melena: Secondary | ICD-10-CM

## 2023-10-03 DIAGNOSIS — Z09 Encounter for follow-up examination after completed treatment for conditions other than malignant neoplasm: Secondary | ICD-10-CM

## 2023-10-03 LAB — CBC WITH DIFFERENTIAL/PLATELET
Basophils Absolute: 0 10*3/uL (ref 0.0–0.1)
Basophils Relative: 0.3 % (ref 0.0–3.0)
Eosinophils Absolute: 0.1 10*3/uL (ref 0.0–0.7)
Eosinophils Relative: 1.5 % (ref 0.0–5.0)
HCT: 35.6 % — ABNORMAL LOW (ref 39.0–52.0)
Hemoglobin: 11.9 g/dL — ABNORMAL LOW (ref 13.0–17.0)
Lymphocytes Relative: 6.6 % — ABNORMAL LOW (ref 12.0–46.0)
Lymphs Abs: 0.6 10*3/uL — ABNORMAL LOW (ref 0.7–4.0)
MCHC: 33.3 g/dL (ref 30.0–36.0)
MCV: 101.5 fL — ABNORMAL HIGH (ref 78.0–100.0)
Monocytes Absolute: 0.5 10*3/uL (ref 0.1–1.0)
Monocytes Relative: 4.7 % (ref 3.0–12.0)
Neutro Abs: 8.4 10*3/uL — ABNORMAL HIGH (ref 1.4–7.7)
Neutrophils Relative %: 86.9 % — ABNORMAL HIGH (ref 43.0–77.0)
Platelets: 213 10*3/uL (ref 150.0–400.0)
RBC: 3.5 Mil/uL — ABNORMAL LOW (ref 4.22–5.81)
RDW: 16 % — ABNORMAL HIGH (ref 11.5–15.5)
WBC: 9.6 10*3/uL (ref 4.0–10.5)

## 2023-10-03 MED ORDER — PREDNISONE 5 MG PO TABS: 20.0000 mg | ORAL_TABLET | Freq: Every day | ORAL | Status: DC

## 2023-10-03 NOTE — Progress Notes (Signed)
 Established Patient Office Visit     CC/Reason for Visit: Hospital discharge follow-up  HPI: [name] is a 88 y.o. male who is coming in today for the above mentioned reasons.  He was admitted to the hospital from 09/19/2023 until 09/21/2023 in response to a low hemoglobin.  He is on chronic anticoagulation with warfarin.  He had had some dental work and it had a lot of blood with subsequent melena.  Hemoglobin done in office returned at 8.9 and he was referred to the hospital for evaluation.  There he declined endoscopy as it is his belief that melena was due to swallowed blood from dental procedure and not so much a GI bleed.  It was recommended that he decrease prednisone  dose (I have discussed this with him many times in the past) but he declined and has been taking prednisone  at his previous dose of 20 mg a day.  He was discharged on Eliquis  but was unable to do so due to financial constraints and is now back on Coumadin .  Has an appointment with the Coumadin  clinic on Friday.  Discharging Hospital physician requested follow-up hemoglobin.   Past Medical/Surgical History: Past Medical History:  Diagnosis Date   Aortic stenosis    Atrial fibrillation (HCC)    Baker cyst    Left knee   DJD (degenerative joint disease)    knees   Dysrhythmia    Eczema    Erectile dysfunction    Gout    History of hiatal hernia    History of radiation therapy    Right lung- 04/21/21-05/03/21 Dr. Lynwood Nasuti   HTN (hypertension)    MVP (mitral valve prolapse)    Shortness of breath dyspnea    with activity    Past Surgical History:  Procedure Laterality Date    polyps vocal cord  1994   AORTIC VALVE REPLACEMENT  ~2004   BRONCHIAL BIOPSY  03/07/2021   Procedure: BRONCHIAL BIOPSIES;  Surgeon: Shelah Lamar RAMAN, MD;  Location: [facility] ENDOSCOPY;  Service: Pulmonary;;   BRONCHIAL BIOPSY  11/14/2021   Procedure: BRONCHIAL BIOPSIES;  Surgeon: Shelah Lamar RAMAN, MD;  Location: [facility] ENDOSCOPY;   Service: Pulmonary;;   BRONCHIAL BRUSHINGS  03/07/2021   Procedure: BRONCHIAL BRUSHINGS;  Surgeon: Shelah Lamar RAMAN, MD;  Location[facility] ENDOSCOPY;  Service: Pulmonary;;   BRONCHIAL BRUSHINGS  11/14/2021   Procedure: BRONCHIAL BRUSHINGS;  Surgeon: Shelah Lamar RAMAN, MD;  Location: [facility] ENDOSCOPY;  Service: Pulmonary;;   BRONCHIAL NEEDLE ASPIRATION BIOPSY  03/07/2021   Procedure: BRONCHIAL NEEDLE ASPIRATION BIOPSIES;  Surgeon: Shelah Lamar RAMAN, MD;  Location: [facility] ENDOSCOPY;  Service: Pulmonary;;   BRONCHIAL NEEDLE ASPIRATION BIOPSY  11/14/2021   Procedure: BRONCHIAL NEEDLE ASPIRATION BIOPSIES;  Surgeon: Shelah Lamar RAMAN, MD;  Location: [facility] ENDOSCOPY;  Service: Pulmonary;;   BRONCHIAL WASHINGS  09/21/2020   Procedure: BRONCHIAL WASHINGS;  Surgeon: Kara Dorn NOVAK, MD;  Location: [facility] ENDOSCOPY;  Service: Pulmonary;;  BAL    BRONCHIAL WASHINGS  03/07/2021   Procedure: BRONCHIAL WASHINGS;  Surgeon: Shelah Lamar RAMAN, MD;  Location: [facility] ENDOSCOPY;  Service: Pulmonary;;   BRONCHIAL WASHINGS  11/14/2021   Procedure: BRONCHIAL WASHINGS;  Surgeon: Shelah Lamar RAMAN, MD;  Location: [facility] ENDOSCOPY;  Service: Pulmonary;;   FIDUCIAL MARKER PLACEMENT  03/07/2021   Procedure: FIDUCIAL MARKER PLACEMENT;  Surgeon: Shelah Lamar RAMAN, MD;  Location: [facility] ENDOSCOPY;  Service: Pulmonary;;   HERNIA REPAIR  ~1978   HERNIA REPAIR  ~1983   IR ANGIOGRAM PELVIS SELECTIVE OR  SUPRASELECTIVE  12/01/2022   IR ANGIOGRAM PELVIS SELECTIVE OR SUPRASELECTIVE  12/01/2022   IR ANGIOGRAM SELECTIVE EACH ADDITIONAL VESSEL  12/01/2022   IR ANGIOGRAM SELECTIVE EACH ADDITIONAL VESSEL  12/01/2022   IR CT SPINE LTD  12/01/2022   IR CT SPINE LTD  12/01/2022   IR CT SPINE LTD  12/01/2022   IR EMBO ARTERIAL NOT HEMORR HEMANG INC GUIDE ROADMAPPING  12/01/2022   IR EMBO TUMOR ORGAN ISCHEMIA INFARCT INC GUIDE ROADMAPPING  12/01/2022   IR RADIOLOGIST EVAL & MGMT  11/17/2022   IR US  GUIDE VASC ACCESS LEFT  12/01/2022   IR US  GUIDE VASC ACCESS RIGHT  12/01/2022   LUMBAR  LAMINECTOMY/DECOMPRESSION MICRODISCECTOMY N/A 01/15/2015   Procedure: 2 LEVEL DECOMPRESSIVE LUMBAR LAMINECTOMY L3-L4,L4-L5;  Surgeon: Tanda Heading, MD;  Location: [facility] ORS;  Service: Orthopedics;  Laterality: N/A;   LUMBAR LAMINECTOMY/DECOMPRESSION MICRODISCECTOMY N/A 05/01/2017   Procedure: Lumbar one-Lumbar two, Lumbar two-Lumbar three, Lumbar three-Lumbar four Laminectomy; Evacuation of hematoma;  Surgeon: Lanis Pupa, MD;  Location: [facility] OR;  Service: Neurosurgery;  Laterality: N/A;   right total hip  ~4 years ago   ROTATOR CUFF REPAIR Left ~2009   TOTAL KNEE ARTHROPLASTY Left 04/14/2015   Procedure: LEFT TOTAL KNEE ARTHROPLASTY;  Surgeon: Tanda Heading, MD;  Location: [facility] ORS;  Service: Orthopedics;  Laterality: Left;   VIDEO BRONCHOSCOPY N/A 09/21/2020   Procedure: VIDEO BRONCHOSCOPY WITHOUT FLUORO;  Surgeon: Kara Dorn NOVAK, MD;  Location: [facility] ENDOSCOPY;  Service: Pulmonary;  Laterality: N/A;   VIDEO BRONCHOSCOPY WITH ENDOBRONCHIAL NAVIGATION Right 03/07/2021   Procedure: VIDEO BRONCHOSCOPY WITH ENDOBRONCHIAL NAVIGATION;  Surgeon: Shelah Lamar RAMAN, MD;  Location[facility] ENDOSCOPY;  Service: Pulmonary;  Laterality: Right;   VIDEO BRONCHOSCOPY WITH RADIAL ENDOBRONCHIAL ULTRASOUND  11/14/2021   Procedure: VIDEO BRONCHOSCOPY WITH RADIAL ENDOBRONCHIAL ULTRASOUND;  Surgeon: Shelah Lamar RAMAN, MD;  Location: [facility] ENDOSCOPY;  Service: Pulmonary;;    Social History:  reports that he quit smoking about 21 years ago. His smoking use included cigarettes. He started smoking about 51 years ago. He has never used smokeless tobacco. He reports current alcohol use of about 3.0 standard drinks of alcohol per week. He reports that he does not use drugs.  Allergies: No Known Allergies  Family History:  Family History  Problem Relation Age of Onset   Diabetes Other    Stroke Other      Current Outpatient Medications:    allopurinol  (ZYLOPRIM ) 300 MG tablet, Take 1 tablet by mouth once daily (Patient taking  differently: Take 300 mg by mouth in the morning.), Disp: 90 tablet, Rfl: 1   amLODipine  (NORVASC ) 5 MG tablet, Take 1 tablet by mouth once daily (Patient taking differently: Take 5 mg by mouth in the morning.), Disp: 90 tablet, Rfl: 1   cyanocobalamin  (VITAMIN B12) 500 MCG tablet, Take 1 tablet (500 mcg total) by mouth daily. (Patient taking differently: Take 500 mcg by mouth daily. BID), Disp: 30 tablet, Rfl: 0   furosemide  (LASIX ) 20 MG tablet, Take 3 tablets (60 mg total) by mouth daily. (Patient taking differently: Take 60 mg by mouth in the morning.), Disp: 270 tablet, Rfl: 3   pantoprazole  (PROTONIX ) 40 MG tablet, Take 1 tablet (40 mg total) by mouth 2 (two) times daily., Disp: 60 tablet, Rfl: 1   potassium chloride  SA (KLOR-CON  M) 20 MEQ tablet, Take 1 tablet (20 mEq total) by mouth daily. (Patient taking differently: Take 20 mEq by mouth in the morning.), Disp: 90 tablet, Rfl: 3   tamsulosin  (FLOMAX ) 0.4  MG CAPS capsule, TAKE 1 CAPSULE BY MOUTH ONCE DAILY AFTER BREAKFAST (Patient taking differently: Take 0.4 mg by mouth daily after breakfast.), Disp: 90 capsule, Rfl: 0   traMADol  (ULTRAM ) 50 MG tablet, TAKE 1 TABLET BY MOUTH EVERY 12 HOURS AS NEEDED, Disp: 60 tablet, Rfl: 0   traZODone  (DESYREL ) 50 MG tablet, TAKE 1 TABLET BY MOUTH AT BEDTIME AS NEEDED, Disp: 90 tablet, Rfl: 0   triamcinolone  cream (KENALOG ) 0.1 %, APPLY  CREAM EXTERNALLY TO AFFECTED AREA TWICE DAILY (Patient taking differently: Apply 1 Application topically 2 (two) times daily as needed.), Disp: 80 g, Rfl: 0   VITAMIN D  PO, Take 1 tablet by mouth in the morning., Disp: , Rfl:    warfarin (COUMADIN ) 5 MG tablet, Take 5 mg on Wednesdays and 2.5 mg on the other days, Disp: 16 tablet, Rfl: 1   predniSONE  (DELTASONE ) 5 MG tablet, Take 4 tablets (20 mg total) by mouth daily with breakfast., Disp: , Rfl:   Review of Systems:  Negative unless indicated in HPI.   Physical Exam: Vitals:   10/03/23 0932  BP: 120/70  Pulse: 80   Temp: (!) 97.4 F (36.3 C)  TempSrc: Oral  SpO2: 98%  Weight: 167 lb 12.8 oz (76.1 kg)    Body mass index is 27.08 kg/m.   Physical Exam Vitals reviewed.  Constitutional:      Appearance: Normal appearance.  HENT:     Head: Normocephalic and atraumatic.  Eyes:     Conjunctiva/sclera: Conjunctivae normal.     Pupils: Pupils are equal, round, and reactive to light.  Cardiovascular:     Rate and Rhythm: Normal rate and regular rhythm.  Pulmonary:     Effort: Pulmonary effort is normal.     Breath sounds: Normal breath sounds.  Skin:    General: Skin is warm and dry.  Neurological:     General: No focal deficit present.     Mental Status: He is alert and oriented to person, place, and time.  Psychiatric:        Mood and Affect: Mood normal.        Behavior: Behavior normal.        Thought Content: Thought content normal.        Judgment: Judgment normal.      Impression and Plan:  Hospital discharge follow-up  Symptomatic anemia -     CBC with Differential/Platelet; Future  Melena -     CBC with Differential/Platelet; Future  Other orders -     predniSONE ; Take 4 tablets (20 mg total) by mouth daily with breakfast.   [facility] charts reviewed in detail. -He is back on Coumadin , is requesting continuing prednisone  at 20 mg.  Understands no over-the-counter NSAID use. -Check hemoglobin today.  Time spent:32 minutes reviewing chart, interviewing and examining patient and formulating plan of care.     Tully Theophilus Andrews, MD [facility]

## 2023-10-05 ENCOUNTER — Ambulatory Visit (INDEPENDENT_AMBULATORY_CARE_PROVIDER_SITE_OTHER): Payer: Medicare Other

## 2023-10-05 DIAGNOSIS — Z7901 Long term (current) use of anticoagulants: Secondary | ICD-10-CM | POA: Diagnosis not present

## 2023-10-05 LAB — POCT INR: INR: 2.7 (ref 2.0–3.0)

## 2023-10-05 NOTE — Progress Notes (Signed)
 Pt already took warfarin today. Hold dose tomorrow and then continue 1/2 tablet daily except take 1 tablet on Wednesday. Recheck in 3 week.

## 2023-10-05 NOTE — Patient Instructions (Addendum)
 Pre visit review using our clinic review tool, if applicable. No additional management support is needed unless otherwise documented below in the visit note.  Hold dose tomorrow and then continue 1/2 tablet daily except take 1 tablet on Wednesday. Recheck in 3 week.

## 2023-10-09 ENCOUNTER — Other Ambulatory Visit: Payer: Self-pay | Admitting: Internal Medicine

## 2023-10-09 DIAGNOSIS — M150 Primary generalized (osteo)arthritis: Secondary | ICD-10-CM

## 2023-10-09 DIAGNOSIS — G4700 Insomnia, unspecified: Secondary | ICD-10-CM

## 2023-10-26 ENCOUNTER — Ambulatory Visit: Payer: Medicare Other

## 2023-10-26 DIAGNOSIS — Z7901 Long term (current) use of anticoagulants: Secondary | ICD-10-CM

## 2023-10-26 LAB — POCT INR: INR: 4.4 — AB (ref 2.0–3.0)

## 2023-10-26 NOTE — Progress Notes (Signed)
 Pt reports lump (size of a golf ball) on the inside of his right knee he discovered yesterday. He is not sure how long it has been there. Assessed and there is no redness, lower leg swelling or warmth. The lump is firm. Looked for open provider apts at 3 clinics today but no availability. Advised pt he should be assess and for DVT. Advised pt to go to ER for assessment and possible [place], even though DVT is unlikely due to elevated INR today. Pt agreed and will go to the [facility] ER today. Pt also reported he stopped pantoprazole 2 days ago.  Pt denies any other changes. Hold dose today and hold tomorrow and then change weekly dose to take 1/2 tablet daily.. Recheck in 1 week.

## 2023-10-26 NOTE — Patient Instructions (Addendum)
 Pre visit review using our clinic review tool, if applicable. No additional management support is needed unless otherwise documented below in the visit note.  Hold dose today and hold tomorrow and then change weekly dose to take 1/2 tablet daily.. Recheck in 1 week.

## 2023-10-30 ENCOUNTER — Ambulatory Visit (INDEPENDENT_AMBULATORY_CARE_PROVIDER_SITE_OTHER): Payer: Medicare Other | Admitting: Internal Medicine

## 2023-10-30 ENCOUNTER — Encounter: Payer: Self-pay | Admitting: Internal Medicine

## 2023-10-30 VITALS — BP 120/64 | HR 76 | Temp 98.0°F | Ht 65.0 in | Wt 165.2 lb

## 2023-10-30 DIAGNOSIS — C3491 Malignant neoplasm of unspecified part of right bronchus or lung: Secondary | ICD-10-CM

## 2023-10-30 DIAGNOSIS — I10 Essential (primary) hypertension: Secondary | ICD-10-CM

## 2023-10-30 DIAGNOSIS — E876 Hypokalemia: Secondary | ICD-10-CM | POA: Diagnosis not present

## 2023-10-30 DIAGNOSIS — E559 Vitamin D deficiency, unspecified: Secondary | ICD-10-CM | POA: Diagnosis not present

## 2023-10-30 DIAGNOSIS — Z0000 Encounter for general adult medical examination without abnormal findings: Secondary | ICD-10-CM | POA: Diagnosis not present

## 2023-10-30 DIAGNOSIS — E538 Deficiency of other specified B group vitamins: Secondary | ICD-10-CM

## 2023-10-30 LAB — CBC WITH DIFFERENTIAL/PLATELET
Basophils Absolute: 0 10*3/uL (ref 0.0–0.1)
Basophils Relative: 0.3 % (ref 0.0–3.0)
Eosinophils Absolute: 0.1 10*3/uL (ref 0.0–0.7)
Eosinophils Relative: 1.2 % (ref 0.0–5.0)
HCT: 37.7 % — ABNORMAL LOW (ref 39.0–52.0)
Hemoglobin: 12.2 g/dL — ABNORMAL LOW (ref 13.0–17.0)
Lymphocytes Relative: 5.3 % — ABNORMAL LOW (ref 12.0–46.0)
Lymphs Abs: 0.6 10*3/uL — ABNORMAL LOW (ref 0.7–4.0)
MCHC: 32.3 g/dL (ref 30.0–36.0)
MCV: 99.3 fl (ref 78.0–100.0)
Monocytes Absolute: 0.5 10*3/uL (ref 0.1–1.0)
Monocytes Relative: 4.9 % (ref 3.0–12.0)
Neutro Abs: 9.4 10*3/uL — ABNORMAL HIGH (ref 1.4–7.7)
Neutrophils Relative %: 88.3 % — ABNORMAL HIGH (ref 43.0–77.0)
Platelets: 239 10*3/uL (ref 150.0–400.0)
RBC: 3.8 Mil/uL — ABNORMAL LOW (ref 4.22–5.81)
RDW: 15.8 % — ABNORMAL HIGH (ref 11.5–15.5)
WBC: 10.7 10*3/uL — ABNORMAL HIGH (ref 4.0–10.5)

## 2023-10-30 LAB — LIPID PANEL
Cholesterol: 207 mg/dL — ABNORMAL HIGH (ref 0–200)
HDL: 70.1 mg/dL (ref >39.00)
LDL Cholesterol: 119 mg/dL — ABNORMAL HIGH (ref 0–99)
NonHDL: 136.41
Total CHOL/HDL Ratio: 3
Triglycerides: 89 mg/dL (ref 0.0–149.0)
VLDL: 17.8 mg/dL (ref 0.0–40.0)

## 2023-10-30 LAB — VITAMIN B12: Vitamin B-12: 355 pg/mL (ref 211–911)

## 2023-10-30 LAB — COMPREHENSIVE METABOLIC PANEL
ALT: 12 U/L (ref 0–53)
AST: 18 U/L (ref 0–37)
Albumin: 4.1 g/dL (ref 3.5–5.2)
Alkaline Phosphatase: 55 U/L (ref 39–117)
BUN: 29 mg/dL — ABNORMAL HIGH (ref 6–23)
CO2: 34 meq/L — ABNORMAL HIGH (ref 19–32)
Calcium: 9 mg/dL (ref 8.4–10.5)
Chloride: 98 meq/L (ref 96–112)
Creatinine, Ser: 1.23 mg/dL (ref 0.40–1.50)
GFR: 53.39 mL/min — ABNORMAL LOW (ref >60.00)
Glucose, Bld: 94 mg/dL (ref 70–99)
Potassium: 4 meq/L (ref 3.5–5.1)
Sodium: 140 meq/L (ref 135–145)
Total Bilirubin: 0.6 mg/dL (ref 0.2–1.2)
Total Protein: 6.6 g/dL (ref 6.0–8.3)

## 2023-10-30 LAB — VITAMIN D 25 HYDROXY (VIT D DEFICIENCY, FRACTURES): VITD: 63.62 ng/mL (ref 30.00–100.00)

## 2023-10-30 LAB — TSH: TSH: 0.72 u[IU]/mL (ref 0.35–5.50)

## 2023-10-30 NOTE — Progress Notes (Signed)
 Established Patient Office Visit     CC/Reason for Visit: Annual preventive exam and subsequent Medicare wellness visit  HPI: [name] is a 88 y.o. male who is coming in today for the above mentioned reasons. Past Medical History is significant for: Hypertension, atrial fibrillation, aortic valve replacement on warfarin, BPH, chronic low back pain and osteoarthritis of the hands.  Also has a history of non-small cell lung cancer and hypersensitivity pneumonitis.  He feels at baseline.  Has routine eye and dental care.  Is due for Tdap and shingles vaccinations.   Past Medical/Surgical History: Past Medical History:  Diagnosis Date   Aortic stenosis    Atrial fibrillation (HCC)    Baker cyst    Left knee   DJD (degenerative joint disease)    knees   Dysrhythmia    Eczema    Erectile dysfunction    Gout    History of hiatal hernia    History of radiation therapy    Right lung- 04/21/21-05/03/21 Dr. Antony Blackbird   HTN (hypertension)    MVP (mitral valve prolapse)    Shortness of breath dyspnea    with activity    Past Surgical History:  Procedure Laterality Date    polyps vocal cord  1994   AORTIC VALVE REPLACEMENT  ~2004   BRONCHIAL BIOPSY  03/07/2021   Procedure: BRONCHIAL BIOPSIES;  Surgeon: Leslye Peer, MD;  Location: [facility];  Service: Pulmonary;;   BRONCHIAL BIOPSY  11/14/2021   Procedure: BRONCHIAL BIOPSIES;  Surgeon: Leslye Peer, MD;  Location: [facility];  Service: Pulmonary;;   BRONCHIAL BRUSHINGS  03/07/2021   Procedure: BRONCHIAL BRUSHINGS;  Surgeon: Leslye Peer, MD;  Location: [facility];  Service: Pulmonary;;   BRONCHIAL BRUSHINGS  11/14/2021   Procedure: BRONCHIAL BRUSHINGS;  Surgeon: Leslye Peer, MD;  [facility]: [facility];  Service: Pulmonary;;   BRONCHIAL NEEDLE ASPIRATION BIOPSY  03/07/2021   Procedure: BRONCHIAL NEEDLE ASPIRATION BIOPSIES;  Surgeon: Leslye Peer, MD;  Location: [facility];  Service: Pulmonary;;    BRONCHIAL NEEDLE ASPIRATION BIOPSY  11/14/2021   Procedure: BRONCHIAL NEEDLE ASPIRATION BIOPSIES;  Surgeon: Leslye Peer, MD;  Location: [facility];  Service: Pulmonary;;   BRONCHIAL WASHINGS  09/21/2020   Procedure: BRONCHIAL WASHINGS;  Surgeon: Martina Sinner, MD;  Location: [facility];  Service: Pulmonary;;  BAL    BRONCHIAL WASHINGS  03/07/2021   Procedure: BRONCHIAL WASHINGS;  Surgeon: Leslye Peer, MD;  Location: [facility];  Service: Pulmonary;;   BRONCHIAL WASHINGS  11/14/2021   Procedure: BRONCHIAL WASHINGS;  Surgeon: Leslye Peer, MD;  Location: [facility];  Service: Pulmonary;;   FIDUCIAL MARKER PLACEMENT  03/07/2021   Procedure: FIDUCIAL MARKER PLACEMENT;  Surgeon: Leslye Peer, MD;  Location: [facility];  Service: Pulmonary;;   HERNIA REPAIR  [phone]   HERNIA REPAIR  ~1983   IR ANGIOGRAM PELVIS SELECTIVE OR SUPRASELECTIVE  12/01/2022   IR ANGIOGRAM PELVIS SELECTIVE OR SUPRASELECTIVE  12/01/2022   IR ANGIOGRAM SELECTIVE EACH ADDITIONAL VESSEL  12/01/2022   IR ANGIOGRAM SELECTIVE EACH ADDITIONAL VESSEL  12/01/2022   IR CT SPINE LTD  12/01/2022   IR CT SPINE LTD  12/01/2022   IR CT SPINE LTD  12/01/2022   IR EMBO ARTERIAL NOT HEMORR HEMANG INC GUIDE ROADMAPPING  12/01/2022   IR EMBO TUMOR ORGAN ISCHEMIA INFARCT INC GUIDE ROADMAPPING  12/01/2022   IR RADIOLOGIST EVAL & MGMT  11/17/2022   IR US GUIDE VASC ACCESS LEFT  12/01/2022   IR US GUIDE VASC ACCESS RIGHT  12/01/2022   LUMBAR LAMINECTOMY/DECOMPRESSION MICRODISCECTOMY N/A 01/15/2015   Procedure: 2 LEVEL DECOMPRESSIVE LUMBAR LAMINECTOMY L3-L4,L4-L5;  Surgeon: Ranee Gosselin, MD;  Location: [facility];  Service: Orthopedics;  Laterality: N/A;   LUMBAR LAMINECTOMY/DECOMPRESSION MICRODISCECTOMY N/A 05/01/2017   Procedure: Lumbar one-Lumbar two, Lumbar two-Lumbar three, Lumbar three-Lumbar four Laminectomy; Evacuation of hematoma;  Surgeon: Lisbeth Renshaw, MD;  Location: [facility] OR;  Service: Neurosurgery;  Laterality: N/A;   right total hip   ~4 years ago   ROTATOR CUFF REPAIR Left ~2009   TOTAL KNEE ARTHROPLASTY Left 04/14/2015   Procedure: LEFT TOTAL KNEE ARTHROPLASTY;  Surgeon: Ranee Gosselin, MD;  Location: [facility];  Service: Orthopedics;  Laterality: Left;   VIDEO BRONCHOSCOPY N/A 09/21/2020   Procedure: VIDEO BRONCHOSCOPY WITHOUT FLUORO;  Surgeon: Martina Sinner, MD;  Location: [facility];  Service: Pulmonary;  Laterality: N/A;   VIDEO BRONCHOSCOPY WITH ENDOBRONCHIAL NAVIGATION Right 03/07/2021   Procedure: VIDEO BRONCHOSCOPY WITH ENDOBRONCHIAL NAVIGATION;  Surgeon: Leslye Peer, MD;  Location: [facility] ENDOSCOPY;  Service: Pulmonary;  Laterality: Right;   VIDEO BRONCHOSCOPY WITH RADIAL ENDOBRONCHIAL ULTRASOUND  11/14/2021   Procedure: VIDEO BRONCHOSCOPY WITH RADIAL ENDOBRONCHIAL ULTRASOUND;  Surgeon: Leslye Peer, MD;  Location: [facility];  Service: Pulmonary;;    Social History:  reports that he quit smoking about 21 years ago. His smoking use included cigarettes. He started smoking about 51 years ago. He has never used smokeless tobacco. He reports current alcohol use of about 3.0 standard drinks of alcohol per week. He reports that he does not use drugs.  Allergies: No Known Allergies  Family History:  Family History  Problem Relation Age of Onset   Diabetes Other    Stroke Other      Current Outpatient Medications:    allopurinol (ZYLOPRIM) 300 MG tablet, Take 1 tablet by mouth once daily (Patient taking differently: Take 300 mg by mouth in the morning.), Disp: 90 tablet, Rfl: 1   amLODipine (NORVASC) 5 MG tablet, Take 1 tablet by mouth once daily (Patient taking differently: Take 5 mg by mouth in the morning.), Disp: 90 tablet, Rfl: 1   cyanocobalamin (VITAMIN B12) 500 MCG tablet, Take 1 tablet (500 mcg total) by mouth daily. (Patient taking differently: Take 500 mcg by mouth daily. BID), Disp: 30 tablet, Rfl: 0   furosemide (LASIX) 20 MG tablet, Take 3 tablets (60 mg total) by mouth daily. (Patient taking  differently: Take 60 mg by mouth in the morning.), Disp: 270 tablet, Rfl: 3   pantoprazole (PROTONIX) 40 MG tablet, Take 1 tablet (40 mg total) by mouth 2 (two) times daily., Disp: 60 tablet, Rfl: 1   potassium chloride SA (KLOR-CON M) 20 MEQ tablet, Take 1 tablet (20 mEq total) by mouth daily. (Patient taking differently: Take 20 mEq by mouth in the morning.), Disp: 90 tablet, Rfl: 3   predniSONE (DELTASONE) 20 MG tablet, Take 1 tablet by mouth once daily with breakfast, Disp: 90 tablet, Rfl: 0   predniSONE (DELTASONE) 5 MG tablet, Take 4 tablets (20 mg total) by mouth daily with breakfast., Disp: , Rfl:    tamsulosin (FLOMAX) 0.4 MG CAPS capsule, TAKE 1 CAPSULE BY MOUTH ONCE DAILY AFTER BREAKFAST (Patient taking differently: Take 0.4 mg by mouth daily after breakfast.), Disp: 90 capsule, Rfl: 0   traMADol (ULTRAM) 50 MG tablet, TAKE 1 TABLET BY MOUTH EVERY 12 HOURS AS NEEDED, Disp: 60 tablet, Rfl: 0   traZODone (DESYREL) 50 MG tablet, TAKE  1 TABLET BY MOUTH AT BEDTIME AS NEEDED, Disp: 90 tablet, Rfl: 0   triamcinolone cream (KENALOG) 0.1 %, APPLY  CREAM EXTERNALLY TO AFFECTED AREA TWICE DAILY (Patient taking differently: Apply 1 Application topically 2 (two) times daily as needed.), Disp: 80 g, Rfl: 0   VITAMIN D PO, Take 1 tablet by mouth in the morning., Disp: , Rfl:    warfarin (COUMADIN) 5 MG tablet, Take 5 mg on Wednesdays and 2.5 mg on the other days, Disp: 16 tablet, Rfl: 1  Review of Systems:  Negative unless indicated in HPI.   Physical Exam: Vitals:   10/30/23 1002  BP: 120/64  Pulse: 76  Temp: 98 F (36.7 C)  TempSrc: Oral  SpO2: 98%  Weight: 165 lb 3.2 oz (74.9 kg)  Height: 5\' 5"  (1.651 m)    Body mass index is 27.49 kg/m.   Physical Exam Vitals reviewed.  Constitutional:      General: He is not in acute distress.    Appearance: Normal appearance. He is not ill-appearing, toxic-appearing or diaphoretic.  HENT:     Head: Normocephalic.     Right Ear: Tympanic  membrane, ear canal and external ear normal. There is no impacted cerumen.     Left Ear: Tympanic membrane, ear canal and external ear normal. There is no impacted cerumen.     Nose: Nose normal.     Mouth/Throat:     Mouth: Mucous membranes are moist.     Pharynx: Oropharynx is clear. No oropharyngeal exudate or posterior oropharyngeal erythema.  Eyes:     General: No scleral icterus.       Right eye: No discharge.        Left eye: No discharge.     Conjunctiva/sclera: Conjunctivae normal.     Pupils: Pupils are equal, round, and reactive to light.  Neck:     Vascular: No carotid bruit.  Cardiovascular:     Rate and Rhythm: Normal rate and regular rhythm.     Pulses: Normal pulses.     Heart sounds: Murmur heard.  Pulmonary:     Effort: Pulmonary effort is normal. No respiratory distress.     Breath sounds: Normal breath sounds.  Abdominal:     General: Abdomen is flat. Bowel sounds are normal.     Palpations: Abdomen is soft.  Musculoskeletal:        General: Normal range of motion.     Cervical back: Normal range of motion.  Skin:    General: Skin is warm and dry.  Neurological:     General: No focal deficit present.     Mental Status: He is alert and oriented to person, place, and time. Mental status is at baseline.  Psychiatric:        Mood and Affect: Mood normal.        Behavior: Behavior normal.        Thought Content: Thought content normal.        Judgment: Judgment normal.    Subsequent Medicare wellness visit   1. Risk factors, based on past  M,S,F - Cardiac Risk Factors include: advanced age (>51men, >2 women);hypertension   2.  Physical activities: Dietary issues and exercise activities discussed:      3.  Depression/mood:  Flowsheet Row Clinical Support from 10/30/2023 in [facility] Total Score 0        4.  ADL's:    10/30/2023    9:57 AM 09/19/2023  7:45 PM  In your present state of health, do you have  any difficulty performing the following activities:  Hearing? 0 0  Vision? 0 0  Difficulty concentrating or making decisions? 0 0  Walking or climbing stairs? 1   Dressing or bathing? 0   Doing errands, shopping? 0 0  Preparing Food and eating ? N   Using the Toilet? N   In the past six months, have you accidently leaked urine? N   Do you have problems with loss of bowel control? N   Managing your Medications? N   Managing your Finances? N   Housekeeping or managing your Housekeeping? N      5.  Fall risk:     11/10/2021    8:23 AM 11/14/2021    6:43 AM 10/26/2022   10:12 AM 08/03/2023    2:16 PM 10/30/2023    9:59 AM  Fall Risk  Falls in the past year?   1 0 0  Was there an injury with Fall?   0 0 0  Fall Risk Category Calculator   1 0 0  (RETIRED) Patient Fall Risk Level High fall risk Moderate fall risk     Patient at Risk for Falls Due to    No Fall Risks   Fall risk Follow up   Falls evaluation completed Falls evaluation completed Falls evaluation completed     6.  Home safety: No problems identified   7.  Height weight, and visual acuity: height and weight as above, vision/hearing: Vision Screening   Right eye Left eye Both eyes  Without correction     With correction 20/25 20/25 20/25      8.  Counseling: Counseling given: Not Answered    9. Lab orders based on risk factors: Laboratory update will be reviewed   10. Cognitive assessment:        10/30/2023    9:59 AM 10/26/2022   10:13 AM  6CIT Screen  What Year? 0 points 0 points  What month? 0 points 0 points  What time? 0 points 0 points  Count back from 20 0 points 0 points  Months in reverse 0 points 0 points  Repeat phrase 0 points 0 points  Total Score 0 points 0 points     11. Screening: Patient provided with a written and personalized 5-10 year screening schedule in the AVS. Health Maintenance  Topic Date Due   Zoster (Shingles) Vaccine (1 of 2) Never done   COVID-19 Vaccine (6 - 2024-25  season) 07/27/2023   DTaP/Tdap/Td vaccine (3 - Tdap) 06/10/2024   Medicare Annual Wellness Visit  10/29/2024   Pneumonia Vaccine  Completed   Flu Shot  Completed   HPV Vaccine  Aged Out    12. Provider List Update: Patient Care Team    Relationship Specialty Notifications Start End  Philip Aspen, Limmie Patricia, MD PCP - General Internal Medicine  07/18/18   Lewayne Bunting, MD PCP - Cardiology Cardiology  05/18/21      13. Advance Directives: Does Patient Have a Medical Advance Directive?: No Would patient like information on creating a medical advance directive?: No - Patient declined  14. Opioids: Patient is not on any opioid prescriptions and has no risk factors for a substance use disorder.   15.   Goals      Protect My Health     Timeframe:  Long-Range Goal Priority:  Medium Start Date:  Expected End Date:                       Follow Up Date 02/29/2025    - schedule and keep appointment for annual check-up    Why is this important?   Screening tests can find diseases early when they are easier to treat.  Your doctor or nurse will talk with you about which tests are important for you.  Getting shots for common diseases like the flu and shingles will help prevent them.     Notes:      win the lottery         I have personally reviewed and noted the following in the patient's chart:   Medical and social history Use of alcohol, tobacco or illicit drugs  Current medications and supplements Functional ability and status Nutritional status Physical activity Advanced directives List of other physicians Hospitalizations, surgeries, and ER visits in previous 12 months Vitals Screenings to include cognitive, depression, and falls Referrals and appointments  In addition, I have reviewed and discussed with patient certain preventive protocols, quality metrics, and best practice recommendations. A written personalized care plan for  preventive services as well as general preventive health recommendations were provided to patient.   Impression and Plan:  Medicare annual wellness visit, subsequent  Essential hypertension -     CBC with Differential/Platelet; Future -     Comprehensive metabolic panel; Future -     Lipid panel; Future -     TSH; Future  Vitamin D deficiency -     VITAMIN D 25 Hydroxy (Vit-D Deficiency, Fractures); Future  Hypokalemia  Vitamin B12 deficiency -     Vitamin B12; Future  Non-small cell cancer of right lung (HCC)   -Recommend routine eye and dental care. -Healthy lifestyle discussed in detail. -Labs to be updated today. -Prostate cancer screening: Declines due to age. Health Maintenance  Topic Date Due   Zoster (Shingles) Vaccine (1 of 2) Never done   COVID-19 Vaccine (6 - 2024-25 season) 07/27/2023   DTaP/Tdap/Td vaccine (3 - Tdap) 06/10/2024   Medicare Annual Wellness Visit  10/29/2024   Pneumonia Vaccine  Completed   Flu Shot  Completed   HPV Vaccine  Aged Out     -Advised to update Tdap and shingles at pharmacy. -Elects to defer all future cancer screening due to age and comorbidities, I agree.     Chaya Jan, MD [facility] at [facility]

## 2023-11-01 ENCOUNTER — Other Ambulatory Visit: Payer: Self-pay | Admitting: *Deleted

## 2023-11-01 ENCOUNTER — Encounter: Payer: Self-pay | Admitting: Internal Medicine

## 2023-11-01 ENCOUNTER — Ambulatory Visit (INDEPENDENT_AMBULATORY_CARE_PROVIDER_SITE_OTHER)

## 2023-11-01 DIAGNOSIS — D649 Anemia, unspecified: Secondary | ICD-10-CM | POA: Diagnosis not present

## 2023-11-01 LAB — IBC + FERRITIN
Ferritin: 96.1 ng/mL (ref 22.0–322.0)
Iron: 88 ug/dL (ref 42–165)
Saturation Ratios: 24.1 % (ref 20.0–50.0)
TIBC: 365.4 ug/dL (ref 250.0–450.0)
Transferrin: 261 mg/dL (ref 212.0–360.0)

## 2023-11-02 ENCOUNTER — Ambulatory Visit: Payer: Medicare Other

## 2023-11-02 DIAGNOSIS — Z7901 Long term (current) use of anticoagulants: Secondary | ICD-10-CM | POA: Diagnosis not present

## 2023-11-02 LAB — POCT INR: INR: 1.8 — AB (ref 2.0–3.0)

## 2023-11-02 NOTE — Patient Instructions (Addendum)
 Pre visit review using our clinic review tool, if applicable. No additional management support is needed unless otherwise documented below in the visit note.  Increase dose today to take 1 tablet and then continue 1/2 tablet daily. Recheck in 3 week.

## 2023-11-02 NOTE — Progress Notes (Signed)
 Increase dose today to take 1 tablet and then continue 1/2 tablet daily. Recheck in 3 week.

## 2023-11-06 ENCOUNTER — Other Ambulatory Visit: Payer: Self-pay | Admitting: Internal Medicine

## 2023-11-06 DIAGNOSIS — M150 Primary generalized (osteo)arthritis: Secondary | ICD-10-CM

## 2023-11-23 ENCOUNTER — Ambulatory Visit

## 2023-11-23 ENCOUNTER — Telehealth: Payer: Self-pay

## 2023-11-23 NOTE — Telephone Encounter (Signed)
 Pt reports his knees are hurting and he cannot make it to coumadin clinic apt today. Requested RS to next week. RS for 4/1, next week. Pt verbalized understanding.

## 2023-11-27 ENCOUNTER — Ambulatory Visit (INDEPENDENT_AMBULATORY_CARE_PROVIDER_SITE_OTHER)

## 2023-11-27 DIAGNOSIS — Z7901 Long term (current) use of anticoagulants: Secondary | ICD-10-CM | POA: Diagnosis not present

## 2023-11-27 LAB — POCT INR: INR: 2.2 (ref 2.0–3.0)

## 2023-11-27 NOTE — Patient Instructions (Addendum)
 Pre visit review using our clinic review tool, if applicable. No additional management support is needed unless otherwise documented below in the visit note.  Continue 1/2 tablet daily. Recheck in 4 week.

## 2023-11-27 NOTE — Progress Notes (Signed)
 Continue 1/2 tablet daily. Recheck in 4 week.

## 2023-12-08 ENCOUNTER — Other Ambulatory Visit: Payer: Self-pay | Admitting: Internal Medicine

## 2023-12-08 DIAGNOSIS — M150 Primary generalized (osteo)arthritis: Secondary | ICD-10-CM

## 2023-12-12 ENCOUNTER — Telehealth: Payer: Self-pay

## 2023-12-12 ENCOUNTER — Other Ambulatory Visit: Payer: Self-pay | Admitting: Internal Medicine

## 2023-12-12 DIAGNOSIS — M150 Primary generalized (osteo)arthritis: Secondary | ICD-10-CM

## 2023-12-12 NOTE — Telephone Encounter (Signed)
 Copied from CRM [id number]. Topic: Clinical - Medication Refill >> Dec 12, 2023  9:13 AM Orien Bird wrote: Most Recent Primary Care Visit:  Provider: Ian Maine  Department: [facility]  Visit Type: COUMADIN CLINIC  Date: 11/27/2023  Medication: traMADol (ULTRAM) 50 MG tablet  Has the patient contacted their pharmacy? Yes (Agent: If no, request that the patient contact the pharmacy for the refill. If patient does not wish to contact the pharmacy document the reason why and proceed with request.) (Agent: If yes, when and what did the pharmacy advise?)  Is this the correct pharmacy for this prescription? Yes If no, delete pharmacy and type the correct one.  This is the patient's preferred pharmacy:  [facility] [address], [place] - [address] [place] [address] Phone: [phone] Fax: [phone]   Has the prescription been filled recently? Yes  Is the patient out of the medication? No  (pt stated he has 3 pills left)   Has the patient been seen for an appointment in the last year OR does the patient have an upcoming appointment? Yes  Can we respond through MyChart? No  Agent: Please be advised that Rx refills may take up to 3 business days. We ask that you follow-up with your pharmacy.

## 2023-12-12 NOTE — Telephone Encounter (Signed)
 Pt calling in regards to his tramadol refill.  Copied from CRM [id number]. Topic: General - Call Back - No Documentation >> Dec 12, 2023  1:26 PM [name] wrote: Reason for CRM: Patient states that he had a missed call from Ivette Marks and is requesting a call back. He states that he has been waiting for his refill since 04/12, he called today and its still pending. I contacted the clinic and was told by Kristeen Peto from the front desk, that Dr. Ival Marines is out of the office, and that they would try and see if another provider would complete the refill request. I informed the patient to check back later to see if they were able to refill. The patient was highly upset and has been without his medication (Tramadol 50mg ) for 3 days.

## 2023-12-12 NOTE — Addendum Note (Signed)
 Addended by: Nicolina Barrios B on: 12/12/2023 03:17 PM   Modules accepted: Orders

## 2023-12-12 NOTE — Telephone Encounter (Signed)
 Copied from CRM [id number]. Topic: Clinical - Medication Question >> Dec 12, 2023 11:21 AM Clyde Darling P wrote: Reason for CRM: Patient wanted to know why the medication refill was taking long, advise it can take up to 3 full business days but I will send a message to nurse as PT is currently out of medications for traMADol (ULTRAM) 50 MG tablet

## 2023-12-12 NOTE — Telephone Encounter (Signed)
 Copied from CRM [id number]. Topic: Clinical - Medication Refill >> Dec 12, 2023  8:03 AM Winnifred Havers wrote: Most Recent Primary Care Visit:  Provider: Ian Maine  Department: [facility]  Visit Type: COUMADIN CLINIC  Date: 11/27/2023  Medication: traMADol (ULTRAM) 50 MG tablet  Has the patient contacted their pharmacy? Yes (Agent: If no, request that the patient contact the pharmacy for the refill. If patient does not wish to contact the pharmacy document the reason why and proceed with request.) (Agent: If yes, when and what did the pharmacy advise?)  Is this the correct pharmacy for this prescription? Yes If no, delete pharmacy and type the correct one.  This is the patient's preferred pharmacy:  [facility] [address], [place] - [address] [place] [address] Phone: [phone] Fax: [phone]   Has the prescription been filled recently? Yes  Is the patient out of the medication? Yes  Has the patient been seen for an appointment in the last year OR does the patient have an upcoming appointment? Yes  Can we respond through MyChart? Yes  Agent: Please be advised that Rx refills may take up to 3 business days. We ask that you follow-up with your pharmacy.

## 2023-12-13 ENCOUNTER — Other Ambulatory Visit: Payer: Self-pay | Admitting: Family Medicine

## 2023-12-13 DIAGNOSIS — M150 Primary generalized (osteo)arthritis: Secondary | ICD-10-CM

## 2023-12-13 MED ORDER — TRAMADOL HCL 50 MG PO TABS: 50.0000 mg | ORAL_TABLET | Freq: Two times a day (BID) | ORAL | 0 refills | Status: DC | PRN

## 2023-12-13 NOTE — Progress Notes (Signed)
 Refilling medication since Dr. Ival Marines is out of the office. Last office visit was 10/03/2023. PDMP reviewed, last refill 11/08/2023.

## 2023-12-18 NOTE — Progress Notes (Signed)
 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 Preoperative cardiac catheterization revealed normal coronary arteries. He has permanent atrial fibrillation. Previous carotid Dopplers due to bruits revealed 0-39% bilateral stenosis. Admitted 9/18 with back pain for planned laminectomy. However he was found to have spontaneous epidural hematoma which was evacuated. Discussed with Dr Nat Badger and he felt anticoagulation could be carefully resumed. Anticoagulation resumed at previous ov. Monitor April 2019 showed atrial fibrillation with multiple pauses greater than 3 seconds all occurring late p.m. or early a.m. hours. We asked patient to be seen by electrophysiology following results of previous event monitor for consideration of pacemaker. Patient declined. Echocardiogram May 2024 showed normal LV function, prior aortic valve replacement with mean gradient 16 mmHg and mild aortic insufficiency, severe left ventricular hypertrophy, severe biatrial enlargement, mild mitral regurgitation.  Had bleeding following a tooth extraction January 2025.  Since I last saw him, patient has dyspnea with more vigorous activities but not routine activities.  No orthopnea, PND, pedal edema, chest pain or syncope.  No recurrent bleeding.  Current Outpatient Medications  Medication Sig Dispense Refill   allopurinol  (ZYLOPRIM ) 300 MG tablet Take 1 tablet by mouth once daily 90 tablet 1   amLODipine  (NORVASC ) 5 MG tablet Take 1 tablet by mouth once daily 90 tablet 1   cyanocobalamin  (VITAMIN B12) 500 MCG tablet Take 1 tablet (500 mcg total) by mouth daily. (Patient taking differently: Take 500 mcg by mouth daily. BID) 30 tablet 0   furosemide  (LASIX ) 20 MG tablet Take 3 tablets (60 mg total) by mouth daily. (Patient taking differently: Take 60 mg by mouth in the morning.) 270 tablet 3   potassium chloride  SA (KLOR-CON  M) 20  MEQ tablet Take 1 tablet (20 mEq total) by mouth daily. (Patient taking differently: Take 20 mEq by mouth in the morning.) 90 tablet 3   predniSONE  (DELTASONE ) 20 MG tablet Take 1 tablet by mouth once daily with breakfast 90 tablet 0   tamsulosin  (FLOMAX ) 0.4 MG CAPS capsule TAKE 1 CAPSULE BY MOUTH ONCE DAILY AFTER BREAKFAST (Patient taking differently: Take 0.4 mg by mouth daily after breakfast.) 90 capsule 0   traMADol  (ULTRAM ) 50 MG tablet TAKE 1 TABLET BY MOUTH EVERY 12 HOURS AS NEEDED 60 tablet 0   traZODone  (DESYREL ) 50 MG tablet TAKE 1 TABLET BY MOUTH AT BEDTIME AS NEEDED 90 tablet 0   triamcinolone  cream (KENALOG ) 0.1 % APPLY CREAM EXTERNALLY TO AFFECTED AREA TWICE DAILY 80 g 2   VITAMIN D  PO Take 1 tablet by mouth in the morning.     warfarin (COUMADIN ) 5 MG tablet Take 5 mg on Wednesdays and 2.5 mg on the other days 16 tablet 1   pantoprazole  (PROTONIX ) 40 MG tablet Take 1 tablet (40 mg total) by mouth 2 (two) times daily. 60 tablet 1   No current facility-administered medications for this visit.     Past Medical History:  Diagnosis Date   Aortic stenosis    Atrial fibrillation (HCC)    Baker cyst    Left knee   DJD (degenerative joint disease)    knees   Dysrhythmia    Eczema    Erectile dysfunction    Gout    History of hiatal hernia    History of radiation therapy    Right lung- 04/21/21-05/03/21 Dr. Retta Caster   HTN (hypertension)    MVP (mitral valve prolapse)  Shortness of breath dyspnea    with activity    Past Surgical History:  Procedure Laterality Date    polyps vocal cord  1994   AORTIC VALVE REPLACEMENT  ~2004   BRONCHIAL BIOPSY  03/07/2021   Procedure: BRONCHIAL BIOPSIES;  Surgeon: Denson Flake, MD;  Location: [facility];  Service: Pulmonary;;   BRONCHIAL BIOPSY  11/14/2021   Procedure: BRONCHIAL BIOPSIES;  Surgeon: Denson Flake, MD;  Location: [facility];  Service: Pulmonary;;   BRONCHIAL BRUSHINGS  03/07/2021   Procedure: BRONCHIAL  BRUSHINGS;  Surgeon: Denson Flake, MD;  Location: [facility];  Service: Pulmonary;;   BRONCHIAL BRUSHINGS  11/14/2021   Procedure: BRONCHIAL BRUSHINGS;  Surgeon: Denson Flake, MD;  Location: [facility];  Service: Pulmonary;;   BRONCHIAL NEEDLE ASPIRATION BIOPSY  03/07/2021   Procedure: BRONCHIAL NEEDLE ASPIRATION BIOPSIES;  Surgeon: Denson Flake, MD;  Location: [facility];  Service: Pulmonary;;   BRONCHIAL NEEDLE ASPIRATION BIOPSY  11/14/2021   Procedure: BRONCHIAL NEEDLE ASPIRATION BIOPSIES;  Surgeon: Denson Flake, MD;  Location: [facility];  Service: Pulmonary;;   BRONCHIAL WASHINGS  09/21/2020   Procedure: BRONCHIAL WASHINGS;  Surgeon: Wilfredo Hanly, MD;  Location: [facility];  Service: Pulmonary;;  BAL    BRONCHIAL WASHINGS  03/07/2021   Procedure: BRONCHIAL WASHINGS;  Surgeon: Denson Flake, MD;  Location: [facility];  Service: Pulmonary;;   BRONCHIAL WASHINGS  11/14/2021   Procedure: BRONCHIAL WASHINGS;  Surgeon: Denson Flake, MD;  Location: [facility] ENDOSCOPY;  Service: Pulmonary;;   FIDUCIAL MARKER PLACEMENT  03/07/2021   Procedure: FIDUCIAL MARKER PLACEMENT;  Surgeon: Denson Flake, MD;  Location: [facility]COPY;  Service: Pulmonary;;   HERNIA REPAIR  [phone]   HERNIA REPAIR  ~1983   IR ANGIOGRAM PELVIS SELECTIVE OR SUPRASELECTIVE  12/01/2022   IR ANGIOGRAM PELVIS SELECTIVE OR SUPRASELECTIVE  12/01/2022   IR ANGIOGRAM SELECTIVE EACH ADDITIONAL VESSEL  12/01/2022   IR ANGIOGRAM SELECTIVE EACH ADDITIONAL VESSEL  12/01/2022   IR CT SPINE LTD  12/01/2022   IR CT SPINE LTD  12/01/2022   IR CT SPINE LTD  12/01/2022   IR EMBO ARTERIAL NOT HEMORR HEMANG INC GUIDE ROADMAPPING  12/01/2022   IR EMBO TUMOR ORGAN ISCHEMIA INFARCT INC GUIDE ROADMAPPING  12/01/2022   IR RADIOLOGIST EVAL & MGMT  11/17/2022   IR US  GUIDE VASC ACCESS LEFT  12/01/2022   IR US  GUIDE VASC ACCESS RIGHT  12/01/2022   LUMBAR LAMINECTOMY/DECOMPRESSION MICRODISCECTOMY N/A 01/15/2015   Procedure: 2 LEVEL DECOMPRESSIVE LUMBAR  LAMINECTOMY L3-L4,L4-L5;  Surgeon: Hazle Lites, MD;  Location: [facility];  Service: Orthopedics;  Laterality: N/A;   LUMBAR LAMINECTOMY/DECOMPRESSION MICRODISCECTOMY N/A 05/01/2017   Procedure: Lumbar one-Lumbar two, Lumbar two-Lumbar three, Lumbar three-Lumbar four Laminectomy; Evacuation of hematoma;  Surgeon: Augusto Blonder, MD;  Location: [facility] OR;  Service: Neurosurgery;  Laterality: N/A;   right total hip  ~4 years ago   ROTATOR CUFF REPAIR Left ~2009   TOTAL KNEE ARTHROPLASTY Left 04/14/2015   Procedure: LEFT TOTAL KNEE ARTHROPLASTY;  Surgeon: Hazle Lites, MD;  Location: [facility];  Service: Orthopedics;  Laterality: Left;   VIDEO BRONCHOSCOPY N/A 09/21/2020   Procedure: VIDEO BRONCHOSCOPY WITHOUT FLUORO;  Surgeon: Wilfredo Hanly, MD;  Location: [facility];  Service: Pulmonary;  Laterality: N/A;   VIDEO BRONCHOSCOPY WITH ENDOBRONCHIAL NAVIGATION Right 03/07/2021   Procedure: VIDEO BRONCHOSCOPY WITH ENDOBRONCHIAL NAVIGATION;  Surgeon: Denson Flake, MD;  Location: [facility]COPY;  Service: Pulmonary;  Laterality: Right;   VIDEO BRONCHOSCOPY  WITH RADIAL ENDOBRONCHIAL ULTRASOUND  11/14/2021   Procedure: VIDEO BRONCHOSCOPY WITH RADIAL ENDOBRONCHIAL ULTRASOUND;  Surgeon: Denson Flake, MD;  Location: [facility];  Service: Pulmonary;;    Social History   Socioeconomic History   Marital status: Divorced    Spouse name: Not on file   Number of children: Not on file   Years of education: Not on file   Highest education level: Not on file  Occupational History   Not on file  Tobacco Use   Smoking status: Former    Current packs/day: 0.00    Types: Cigarettes    Start date: 08/28/1972    Quit date: 08/28/2002    Years since quitting: 21.3   Smokeless tobacco: Never  Vaping Use   Vaping status: Never Used  Substance and Sexual Activity   Alcohol use: Yes    Alcohol/week: 3.0 standard drinks of alcohol    Types: 3 Shots of liquor per week    Comment: social   Drug use: No   Sexual  activity: Not on file  Other Topics Concern   Not on file  Social History Narrative   Not on file   Social Drivers of Health   Financial Resource Strain: Low Risk  (10/30/2023)   Overall Financial Resource Strain (CARDIA)    Difficulty of Paying Living Expenses: Not hard at all  Food Insecurity: No Food Insecurity (10/30/2023)   Hunger Vital Sign    Worried About Running Out of Food in the Last Year: Never true    Ran Out of Food in the Last Year: Never true  Transportation Needs: No Transportation Needs (10/30/2023)   PRAPARE - Administrator, Civil Service (Medical): No    Lack of Transportation (Non-Medical): No  Physical Activity: Insufficiently Active (10/30/2023)   Exercise Vital Sign    Days of Exercise per Week: 3 days    Minutes of Exercise per Session: 30 min  Stress: No Stress Concern Present (10/30/2023)   [employer] of Occupational Health - Occupational Stress Questionnaire    Feeling of Stress : Not at all  Social Connections: Moderately Isolated (10/30/2023)   Social Connection and Isolation Panel [NHANES]    Frequency of Communication with Friends and Family: More than three times a week    Frequency of Social Gatherings with Friends and Family: Once a week    Attends Religious Services: More than 4 times per year    Active Member of [employer] or Organizations: No    Attends Banker Meetings: Never    Marital Status: Divorced  Catering manager Violence: Not At Risk (10/30/2023)   Humiliation, Afraid, Rape, and Kick questionnaire    Fear of Current or Ex-Partner: No    Emotionally Abused: No    Physically Abused: No    Sexually Abused: No    Family History  Problem Relation Age of Onset   Diabetes Other    Stroke Other     ROS: Fatigue but no fevers or chills, productive cough, hemoptysis, dysphasia, odynophagia, melena, hematochezia, dysuria, hematuria, rash, seizure activity, orthopnea, PND, pedal edema, claudication. Remaining systems are  negative.  Physical Exam: Well-developed well-nourished in no acute distress.  Skin is warm and dry.  HEENT is normal.  Neck is supple.  Chest is clear to auscultation with normal expansion.  Cardiovascular exam is irregular, 2/6 systolic murmur left sternal border. Abdominal exam nontender or distended. No masses palpated. Extremities show no edema. neuro grossly intact   A/P  1  permanent atrial fibrillation-patient's heart rate remains controlled on no medications.  Continue Coumadin  with goal INR 2-3.  Note patient is on Coumadin  as he has difficulty affording apixaban  or Xarelto .  2 history of aortic valve replacement-continue SBE prophylaxis.  3 chronic diastolic congestive heart failure-continue diuretic at present dose.  He does have some dyspnea but there likely is a contribution from COPD/history of lung cancer/pneumonitis.  4 hypertension-blood pressure is controlled.  Continue present medical regimen.  5 history of bradycardia-no history of syncope.  Avoid AV nodal blocking agents.  Previously declined EP evaluation despite pauses noted.  6 history of lung cancer-followed by oncology.  7 thoracic aortic aneurysm status postrepair-no evidence of aortic dilatation noted on most recent echocardiogram.  8 fatigue-recent TSH normal.  Recent hemoglobin 12.2.  Patient should follow-up with primary care if symptoms persist.  Alexandria Angel, MD

## 2023-12-25 ENCOUNTER — Ambulatory Visit (INDEPENDENT_AMBULATORY_CARE_PROVIDER_SITE_OTHER)

## 2023-12-25 DIAGNOSIS — Z7901 Long term (current) use of anticoagulants: Secondary | ICD-10-CM

## 2023-12-25 LAB — POCT INR: INR: 1.7 — AB (ref 2.0–3.0)

## 2023-12-25 NOTE — Progress Notes (Signed)
 Increase dose today to take 1 tablet and then continue 1/2 tablet daily. Recheck in 3 week.

## 2023-12-25 NOTE — Patient Instructions (Addendum)
 Pre visit review using our clinic review tool, if applicable. No additional management support is needed unless otherwise documented below in the visit note.  Increase dose today to take 1 tablet and then continue 1/2 tablet daily. Recheck in 3 week.

## 2024-01-01 ENCOUNTER — Encounter: Payer: Self-pay | Admitting: Cardiology

## 2024-01-01 ENCOUNTER — Ambulatory Visit: Payer: Medicare Other | Attending: Cardiology | Admitting: Cardiology

## 2024-01-01 VITALS — BP 136/62 | HR 76 | Ht 66.0 in | Wt 163.6 lb

## 2024-01-01 DIAGNOSIS — Z952 Presence of prosthetic heart valve: Secondary | ICD-10-CM | POA: Diagnosis not present

## 2024-01-01 DIAGNOSIS — I4821 Permanent atrial fibrillation: Secondary | ICD-10-CM

## 2024-01-01 DIAGNOSIS — I10 Essential (primary) hypertension: Secondary | ICD-10-CM

## 2024-01-01 DIAGNOSIS — I5032 Chronic diastolic (congestive) heart failure: Secondary | ICD-10-CM | POA: Diagnosis not present

## 2024-01-01 NOTE — Patient Instructions (Signed)

## 2024-01-10 ENCOUNTER — Other Ambulatory Visit: Payer: Self-pay | Admitting: Internal Medicine

## 2024-01-10 DIAGNOSIS — G4700 Insomnia, unspecified: Secondary | ICD-10-CM

## 2024-01-13 ENCOUNTER — Emergency Department (HOSPITAL_COMMUNITY)
Admission: EM | Admit: 2024-01-13 | Discharge: 2024-01-13 | Disposition: A | Discharge status: Home / Self Care | Attending: Emergency Medicine | Admitting: Emergency Medicine

## 2024-01-13 ENCOUNTER — Other Ambulatory Visit: Payer: Self-pay

## 2024-01-13 ENCOUNTER — Emergency Department (HOSPITAL_COMMUNITY)

## 2024-01-13 ENCOUNTER — Encounter (HOSPITAL_COMMUNITY): Payer: Self-pay | Admitting: Emergency Medicine

## 2024-01-13 DIAGNOSIS — I5032 Chronic diastolic (congestive) heart failure: Secondary | ICD-10-CM | POA: Insufficient documentation

## 2024-01-13 DIAGNOSIS — W19XXXA Unspecified fall, initial encounter: Secondary | ICD-10-CM

## 2024-01-13 DIAGNOSIS — S81812A Laceration without foreign body, left lower leg, initial encounter: Secondary | ICD-10-CM

## 2024-01-13 DIAGNOSIS — R519 Headache, unspecified: Secondary | ICD-10-CM | POA: Insufficient documentation

## 2024-01-13 DIAGNOSIS — S0990XA Unspecified injury of head, initial encounter: Secondary | ICD-10-CM | POA: Diagnosis not present

## 2024-01-13 DIAGNOSIS — R000 Tachycardia, unspecified: Secondary | ICD-10-CM | POA: Diagnosis not present

## 2024-01-13 DIAGNOSIS — S51012A Laceration without foreign body of left elbow, initial encounter: Secondary | ICD-10-CM | POA: Diagnosis not present

## 2024-01-13 DIAGNOSIS — S61412A Laceration without foreign body of left hand, initial encounter: Secondary | ICD-10-CM | POA: Diagnosis not present

## 2024-01-13 DIAGNOSIS — S8992XA Unspecified injury of left lower leg, initial encounter: Secondary | ICD-10-CM | POA: Diagnosis present

## 2024-01-13 DIAGNOSIS — S61419A Laceration without foreign body of unspecified hand, initial encounter: Secondary | ICD-10-CM

## 2024-01-13 DIAGNOSIS — W108XXA Fall (on) (from) other stairs and steps, initial encounter: Secondary | ICD-10-CM | POA: Insufficient documentation

## 2024-01-13 DIAGNOSIS — S199XXA Unspecified injury of neck, initial encounter: Secondary | ICD-10-CM | POA: Diagnosis not present

## 2024-01-13 DIAGNOSIS — Z85118 Personal history of other malignant neoplasm of bronchus and lung: Secondary | ICD-10-CM | POA: Insufficient documentation

## 2024-01-13 DIAGNOSIS — R58 Hemorrhage, not elsewhere classified: Secondary | ICD-10-CM | POA: Diagnosis not present

## 2024-01-13 NOTE — ED Notes (Signed)
 Patient transported to CT

## 2024-01-13 NOTE — ED Provider Notes (Signed)
 [place] EMERGENCY DEPARTMENT AT [facility] Provider Note   CSN: [id number] Arrival date & time: 01/13/24  0127     History  Chief Complaint  Patient presents with   [name]    [name] is a 88 y.o. male.  Patient with past medical history significant for aortic valve replacement, chronic diastolic CHF, non-small cell lung cancer, COVID usage presents to the emergency department via EMS complaining of injury secondary to a fall.  Patient states he was climbing steps when he missed a stair falling on his left side.  He complains of skin tears to the left lower leg, left knee, left hand, and left elbow.  He denies hitting his head and denies loss of consciousness.  He was able to ambulate on scene.  He denies any complaints with movement.   Fall       Home Medications Prior to Admission medications   Medication Sig Start Date End Date Taking? Authorizing Provider  allopurinol  (ZYLOPRIM ) 300 MG tablet Take 1 tablet by mouth once daily 11/07/23   Zilphia Hilt, Charyl Coppersmith, MD  amLODipine  (NORVASC ) 5 MG tablet Take 1 tablet by mouth once daily 11/07/23   Zilphia Hilt, Charyl Coppersmith, MD  cyanocobalamin  (VITAMIN B12) 500 MCG tablet Take 1 tablet (500 mcg total) by mouth daily. Patient taking differently: Take 500 mcg by mouth daily. BID 09/22/23   Regalado, Belkys A, MD  furosemide  (LASIX ) 20 MG tablet Take 3 tablets (60 mg total) by mouth daily. Patient taking differently: Take 60 mg by mouth in the morning. 06/28/23   Lenise Quince, MD  pantoprazole  (PROTONIX ) 40 MG tablet Take 1 tablet (40 mg total) by mouth 2 (two) times daily. 09/21/23 11/20/23  Regalado, Belkys A, MD  potassium chloride  SA (KLOR-CON  M) 20 MEQ tablet Take 1 tablet (20 mEq total) by mouth daily. Patient taking differently: Take 20 mEq by mouth in the morning. 07/11/23   Lenise Quince, MD  predniSONE  (DELTASONE ) 20 MG tablet Take 1 tablet by mouth once daily with breakfast 01/10/24   Zilphia Hilt, Charyl Coppersmith, MD  tamsulosin  (FLOMAX ) 0.4 MG CAPS capsule TAKE 1 CAPSULE BY MOUTH ONCE DAILY AFTER BREAKFAST Patient taking differently: Take 0.4 mg by mouth daily after breakfast. 12/25/22   Zilphia Hilt, Charyl Coppersmith, MD  traMADol  (ULTRAM ) 50 MG tablet TAKE 1 TABLET BY MOUTH EVERY 12 HOURS AS NEEDED 12/18/23   Zilphia Hilt, Charyl Coppersmith, MD  traZODone  (DESYREL ) 50 MG tablet TAKE 1 TABLET BY MOUTH AT BEDTIME AS NEEDED 01/10/24   Zilphia Hilt, Charyl Coppersmith, MD  triamcinolone  cream (KENALOG ) 0.1 % APPLY CREAM EXTERNALLY TO AFFECTED AREA TWICE DAILY 11/07/23   Zilphia Hilt, Charyl Coppersmith, MD  VITAMIN D  PO Take 1 tablet by mouth in the morning.    [provider]  warfarin (COUMADIN ) 5 MG tablet Take 5 mg on Wednesdays and 2.5 mg on the other days 10/02/23   Jude Norton, NP      Allergies    Patient has no known allergies.    Review of Systems   Review of Systems  Physical Exam Updated Vital Signs BP (!) 148/79 (BP Location: Right Arm)   Pulse 94   Temp 98.4 F (36.9 C) (Oral)   Resp 18   SpO2 100%  Physical Exam Vitals and nursing note reviewed.  HENT:     Head: Normocephalic and atraumatic.  Eyes:     Conjunctiva/sclera: Conjunctivae normal.     Pupils: Pupils are  equal, round, and reactive to light.  Cardiovascular:     Rate and Rhythm: Normal rate.  Pulmonary:     Effort: Pulmonary effort is normal. No respiratory distress.  Musculoskeletal:        General: No signs of injury.     Cervical back: Normal range of motion. No tenderness.  Skin:    General: Skin is dry.     Comments: Patient with multiple skin tears.  Large skin tear on the left lower lateral extremity, skin tear to left anterior knee, skin tear to left hand, and skin tear to left elbow.  See image below of large left lateral lower leg injury.  Skin is very thin in all areas, not amenable to primary closure  Neurological:     Mental Status: He is alert and oriented to person, place, and time.   Psychiatric:        Speech: Speech normal.        Behavior: Behavior normal.     ED Results / Procedures / Treatments   Labs (all labs ordered are listed, but only abnormal results are displayed) Labs Reviewed - No data to display  EKG None  Radiology CT Head Wo Contrast Result Date: 01/13/2024 CLINICAL DATA:  Head trauma, minor (Age >= 65y); Neck trauma (Age >= 65y) EXAM: CT HEAD WITHOUT CONTRAST CT CERVICAL SPINE WITHOUT CONTRAST TECHNIQUE: Multidetector CT imaging of the head and cervical spine was performed following the standard protocol without intravenous contrast. Multiplanar CT image reconstructions of the cervical spine were also generated. RADIATION DOSE REDUCTION: This exam was performed according to the departmental dose-optimization program which includes automated exposure control, adjustment of the mA and/or kV according to patient size and/or use of iterative reconstruction technique. COMPARISON:  None Available. FINDINGS: CT HEAD FINDINGS Brain: No evidence of acute infarction, hemorrhage, hydrocephalus, extra-axial collection or mass lesion/mass effect. Patchy white matter hypodensities are nonspecific but compatible with chronic microvascular ischemic change. Vascular: No hyperdense vessel. Skull: No acute fracture. Sinuses/Orbits: Clear sinuses.  No acute orbital findings. Other: No mastoid effusions. CT CERVICAL SPINE FINDINGS Alignment: No substantial sagittal subluxation. Skull base and vertebrae: No acute fracture. Soft tissues and spinal canal: No prevertebral fluid or swelling. No visible canal hematoma. 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 Upper chest: Visualized lung apices are clear. IMPRESSION: 1. No evidence of acute intracranial abnormality. 2. No evidence of acute fracture or traumatic malalignment in the cervical spine. 3. Moderate to severe multilevel  degenerative change in the cervical spine. Electronically Signed   By: Stevenson Elbe M.D.   On: 01/13/2024 03:22   CT Cervical Spine Wo Contrast Result Date: 01/13/2024 CLINICAL DATA:  Head trauma, minor (Age >= 65y); Neck trauma (Age >= 65y) EXAM: CT HEAD WITHOUT CONTRAST CT CERVICAL SPINE WITHOUT CONTRAST TECHNIQUE: Multidetector CT imaging of the head and cervical spine was performed following the standard protocol without intravenous contrast. Multiplanar CT image reconstructions of the cervical spine were also generated. RADIATION DOSE REDUCTION: This exam was performed according to the departmental dose-optimization program which includes automated exposure control, adjustment of the mA and/or kV according to patient size and/or use of iterative reconstruction technique. COMPARISON:  None Available. FINDINGS: CT HEAD FINDINGS Brain: No evidence of acute infarction, hemorrhage, hydrocephalus, extra-axial collection or mass lesion/mass effect. Patchy white matter hypodensities are nonspecific but compatible with chronic microvascular ischemic change. Vascular: No hyperdense vessel. Skull: No acute fracture. Sinuses/Orbits: Clear sinuses.  No acute orbital findings. Other: No mastoid effusions. CT CERVICAL SPINE FINDINGS Alignment: No substantial sagittal subluxation. Skull base and vertebrae: No acute fracture. Soft tissues and spinal canal: No prevertebral fluid or swelling. No visible canal hematoma. 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 Upper chest: Visualized lung apices are clear. IMPRESSION: 1. No evidence of acute intracranial abnormality. 2. No evidence of acute fracture or traumatic malalignment in the cervical spine. 3. Moderate to severe multilevel degenerative change in the cervical spine. Electronically Signed   By: Stevenson Elbe M.D.   On: 01/13/2024 03:22    Procedures Procedures     Medications Ordered in ED Medications - No data to display  ED Course/ Medical Decision Making/ A&P                                 Medical Decision Making Amount and/or Complexity of Data Reviewed Radiology: ordered.   This patient presents to the ED for concern of injuries post fall, this involves an extensive number of treatment options, and is a complaint that carries with it a high risk of complications and morbidity.  The differential diagnosis includes fracture, dislocation, intracranial abnormality, soft tissue injuries   Co morbidities that complicate the patient evaluation  Patient is on Coumadin    Additional history obtained:  Additional history obtained from EMS   Imaging Studies ordered:  I ordered imaging studies including CT head and cervical spine I independently visualized and interpreted imaging which showed  1. No evidence of acute intracranial abnormality.  2. No evidence of acute fracture or traumatic malalignment in the  cervical spine.  3. Moderate to severe multilevel degenerative change in the cervical  spine.   I agree with the radiologist interpretation   Social Determinants of Health:  Patient is a former smoker   Test / Admission - Considered:  Patient with multiple skin wounds not amenable to primary closure closure due to type of wound and thinness of patient's skin.  These will require healing by secondary intention.  No acute findings on head CT or cervical spine CT.  No sign of head bleed.  Patient will need to follow-up with his primary care provider for further management of his wounds.  Return precautions provided.         Final Clinical Impression(s) / ED Diagnoses Final diagnoses:  Fall, initial encounter  Skin tear of hand without complication, initial encounter  Skin tear of left lower leg without complication, initial encounter  Skin tear of left elbow without complication, initial encounter    Rx / DC  Orders ED Discharge Orders     None         Delories Fetter 01/13/24 0403    Edson Graces, MD 01/13/24 [phone]

## 2024-01-13 NOTE — ED Triage Notes (Signed)
 Pt here from home with c/o a trip and fall has skin tears to arm leg and hand , pt is on thinners no loc and did not hit his head

## 2024-01-13 NOTE — ED Notes (Signed)
 ED Provider at bedside.

## 2024-01-13 NOTE — ED Notes (Signed)
 Wounds dressed by Ronny Colas, RN & Hewitt Lou, PA - patient tolerated well.

## 2024-01-13 NOTE — Discharge Instructions (Signed)
 Your wounds will need to heal over time.  Unfortunately these were unable to be closed.  Please follow with your primary care team for further management as needed.  Please keep the wounds clean and dry.  Change your bandages daily.  I recommend nonadherent dressings.  If you develop any life-threatening symptoms please return to the emergency department.

## 2024-01-15 ENCOUNTER — Ambulatory Visit

## 2024-01-17 ENCOUNTER — Telehealth: Payer: Self-pay

## 2024-01-17 NOTE — Telephone Encounter (Signed)
 Pt reports he was in ER on 5/18 for skin tear on left leg from a fall. He thought they checked INR in the ER.  Advised no labs were performed in ER. Pt reports they did not prescribe anything but did advise him of s/s of infection and how to care for the skin tear.   Pt is requesting to RS his coumadin  clinic apt for tomorrow due to not feeling he can get out right now with his leg as it is.   RS for 6/6 per pt request. Advised if any changes to contact office. Pt verbalized understanding.

## 2024-01-18 ENCOUNTER — Ambulatory Visit

## 2024-01-25 DIAGNOSIS — S81802A Unspecified open wound, left lower leg, initial encounter: Secondary | ICD-10-CM | POA: Diagnosis not present

## 2024-01-25 DIAGNOSIS — L03116 Cellulitis of left lower limb: Secondary | ICD-10-CM | POA: Diagnosis not present

## 2024-01-29 ENCOUNTER — Encounter: Attending: Physician Assistant | Admitting: Physician Assistant

## 2024-01-29 DIAGNOSIS — S81812A Laceration without foreign body, left lower leg, initial encounter: Secondary | ICD-10-CM | POA: Insufficient documentation

## 2024-01-29 DIAGNOSIS — I5042 Chronic combined systolic (congestive) and diastolic (congestive) heart failure: Secondary | ICD-10-CM | POA: Diagnosis not present

## 2024-01-29 DIAGNOSIS — Z7901 Long term (current) use of anticoagulants: Secondary | ICD-10-CM | POA: Insufficient documentation

## 2024-01-29 DIAGNOSIS — I480 Paroxysmal atrial fibrillation: Secondary | ICD-10-CM | POA: Diagnosis not present

## 2024-01-29 DIAGNOSIS — W109XXA Fall (on) (from) unspecified stairs and steps, initial encounter: Secondary | ICD-10-CM | POA: Insufficient documentation

## 2024-01-29 DIAGNOSIS — M1000 Idiopathic gout, unspecified site: Secondary | ICD-10-CM | POA: Insufficient documentation

## 2024-01-29 DIAGNOSIS — S31821A Laceration without foreign body of left buttock, initial encounter: Secondary | ICD-10-CM | POA: Insufficient documentation

## 2024-01-29 DIAGNOSIS — L97823 Non-pressure chronic ulcer of other part of left lower leg with necrosis of muscle: Secondary | ICD-10-CM | POA: Diagnosis not present

## 2024-01-29 DIAGNOSIS — I110 Hypertensive heart disease with heart failure: Secondary | ICD-10-CM | POA: Diagnosis not present

## 2024-01-29 DIAGNOSIS — L98412 Non-pressure chronic ulcer of buttock with fat layer exposed: Secondary | ICD-10-CM | POA: Diagnosis not present

## 2024-01-29 DIAGNOSIS — L97822 Non-pressure chronic ulcer of other part of left lower leg with fat layer exposed: Secondary | ICD-10-CM | POA: Diagnosis present

## 2024-01-30 DIAGNOSIS — S81812A Laceration without foreign body, left lower leg, initial encounter: Secondary | ICD-10-CM | POA: Diagnosis not present

## 2024-01-30 DIAGNOSIS — I480 Paroxysmal atrial fibrillation: Secondary | ICD-10-CM | POA: Diagnosis not present

## 2024-01-30 DIAGNOSIS — L98412 Non-pressure chronic ulcer of buttock with fat layer exposed: Secondary | ICD-10-CM | POA: Diagnosis not present

## 2024-01-30 DIAGNOSIS — Z7901 Long term (current) use of anticoagulants: Secondary | ICD-10-CM | POA: Diagnosis not present

## 2024-02-01 ENCOUNTER — Other Ambulatory Visit (INDEPENDENT_AMBULATORY_CARE_PROVIDER_SITE_OTHER): Payer: Self-pay | Admitting: Physician Assistant

## 2024-02-01 ENCOUNTER — Ambulatory Visit

## 2024-02-01 DIAGNOSIS — L97823 Non-pressure chronic ulcer of other part of left lower leg with necrosis of muscle: Secondary | ICD-10-CM

## 2024-02-01 DIAGNOSIS — Z7901 Long term (current) use of anticoagulants: Secondary | ICD-10-CM | POA: Diagnosis not present

## 2024-02-01 LAB — POCT INR: INR: 1.4 — AB (ref 2.0–3.0)

## 2024-02-01 NOTE — Patient Instructions (Addendum)
 Pre visit review using our clinic review tool, if applicable. No additional management support is needed unless otherwise documented below in the visit note.  Increase dose today to take 1 tablet and then change weekly dose to take 1/2 tablet daily except take 1 tablet on Wednesday. Recheck in 2 week.

## 2024-02-01 NOTE — Progress Notes (Signed)
 Pt was in ER on 5/18 for a fall resulting in several skin tears. Pt was prescribed Keflex x 2 weeks. No interaction with warfarin. Pt will finish in 1 week.  Pt denies missing any doses. Pt does not know why he is subtherapeutic. Questioned concerning diet and pt denies any changes.  Increase dose today to take 1 tablet and then change weekly dose to take 1/2 tablet daily except take 1 tablet on Wednesday. Recheck in 2 week.

## 2024-02-05 ENCOUNTER — Encounter: Admitting: Physician Assistant

## 2024-02-05 ENCOUNTER — Ambulatory Visit (INDEPENDENT_AMBULATORY_CARE_PROVIDER_SITE_OTHER)

## 2024-02-05 ENCOUNTER — Encounter (INDEPENDENT_AMBULATORY_CARE_PROVIDER_SITE_OTHER)

## 2024-02-05 DIAGNOSIS — S81822A Laceration with foreign body, left lower leg, initial encounter: Secondary | ICD-10-CM | POA: Diagnosis not present

## 2024-02-05 DIAGNOSIS — L97823 Non-pressure chronic ulcer of other part of left lower leg with necrosis of muscle: Secondary | ICD-10-CM

## 2024-02-05 DIAGNOSIS — S31821A Laceration without foreign body of left buttock, initial encounter: Secondary | ICD-10-CM | POA: Diagnosis not present

## 2024-02-05 DIAGNOSIS — I480 Paroxysmal atrial fibrillation: Secondary | ICD-10-CM | POA: Diagnosis not present

## 2024-02-05 DIAGNOSIS — M1000 Idiopathic gout, unspecified site: Secondary | ICD-10-CM | POA: Diagnosis not present

## 2024-02-05 DIAGNOSIS — L98412 Non-pressure chronic ulcer of buttock with fat layer exposed: Secondary | ICD-10-CM | POA: Diagnosis not present

## 2024-02-05 DIAGNOSIS — Z7901 Long term (current) use of anticoagulants: Secondary | ICD-10-CM | POA: Diagnosis not present

## 2024-02-05 DIAGNOSIS — I5042 Chronic combined systolic (congestive) and diastolic (congestive) heart failure: Secondary | ICD-10-CM | POA: Diagnosis not present

## 2024-02-05 DIAGNOSIS — I110 Hypertensive heart disease with heart failure: Secondary | ICD-10-CM | POA: Diagnosis not present

## 2024-02-05 DIAGNOSIS — S81812A Laceration without foreign body, left lower leg, initial encounter: Secondary | ICD-10-CM | POA: Diagnosis not present

## 2024-02-09 ENCOUNTER — Other Ambulatory Visit: Payer: Self-pay | Admitting: Internal Medicine

## 2024-02-09 DIAGNOSIS — M150 Primary generalized (osteo)arthritis: Secondary | ICD-10-CM

## 2024-02-12 ENCOUNTER — Encounter

## 2024-02-12 DIAGNOSIS — I480 Paroxysmal atrial fibrillation: Secondary | ICD-10-CM | POA: Diagnosis not present

## 2024-02-12 DIAGNOSIS — L97823 Non-pressure chronic ulcer of other part of left lower leg with necrosis of muscle: Secondary | ICD-10-CM | POA: Diagnosis not present

## 2024-02-12 DIAGNOSIS — I110 Hypertensive heart disease with heart failure: Secondary | ICD-10-CM | POA: Diagnosis not present

## 2024-02-12 DIAGNOSIS — L98412 Non-pressure chronic ulcer of buttock with fat layer exposed: Secondary | ICD-10-CM | POA: Diagnosis not present

## 2024-02-12 DIAGNOSIS — I5042 Chronic combined systolic (congestive) and diastolic (congestive) heart failure: Secondary | ICD-10-CM | POA: Diagnosis not present

## 2024-02-12 DIAGNOSIS — S81812A Laceration without foreign body, left lower leg, initial encounter: Secondary | ICD-10-CM | POA: Diagnosis not present

## 2024-02-12 DIAGNOSIS — Z7901 Long term (current) use of anticoagulants: Secondary | ICD-10-CM | POA: Diagnosis not present

## 2024-02-12 DIAGNOSIS — S31821A Laceration without foreign body of left buttock, initial encounter: Secondary | ICD-10-CM | POA: Diagnosis not present

## 2024-02-12 DIAGNOSIS — M1000 Idiopathic gout, unspecified site: Secondary | ICD-10-CM | POA: Diagnosis not present

## 2024-02-14 ENCOUNTER — Ambulatory Visit (INDEPENDENT_AMBULATORY_CARE_PROVIDER_SITE_OTHER): Admitting: Vascular Surgery

## 2024-02-14 VITALS — BP 127/74 | HR 91 | Ht 63.0 in | Wt 160.2 lb

## 2024-02-14 DIAGNOSIS — I10 Essential (primary) hypertension: Secondary | ICD-10-CM

## 2024-02-14 DIAGNOSIS — M48062 Spinal stenosis, lumbar region with neurogenic claudication: Secondary | ICD-10-CM

## 2024-02-14 DIAGNOSIS — J432 Centrilobular emphysema: Secondary | ICD-10-CM | POA: Diagnosis not present

## 2024-02-14 DIAGNOSIS — I70213 Atherosclerosis of native arteries of extremities with intermittent claudication, bilateral legs: Secondary | ICD-10-CM

## 2024-02-14 NOTE — Progress Notes (Signed)
 MRN : [id number]  [name] is a 88 y.o. (Jun 14, 1936) male who presents with chief complaint of check circulation.  History of Present Illness:    The patient is seen for evaluation of painful lower extremities and diminished pulses. Patient notes the pain is always associated with activity and is very consistent day today. Typically, the pain occurs at less than one block, progress is as activity continues to the point that the patient must stop walking. Resting including standing still for several minutes allows the patient to walk a similar distance before being forced to stop again. Uneven terrain and inclines shorten the distance. The pain has been progressive over the past several years. The patient denies any abrupt changes in claudication symptoms.  The patient states the inability to walk is causing problems with daily activities.  The patient denies rest pain or dangling of an extremity off the side of the bed during the night for relief. No open wounds or sores at this time. No prior interventions or surgeries.  There is a history of back problems or DJD of the lumbar sacral spine.   The patient's blood pressure has been stable and relatively well controlled. The patient denies amaurosis fugax or recent TIA symptoms. There are no recent neurological changes noted. The patient denies history of DVT, PE or superficial thrombophlebitis. The patient denies recent episodes of angina or shortness of breath.   ABI's Rt=1.14 biphasic and Lt=0.99 monophasic  No outpatient medications have been marked as taking for the 02/14/24 encounter (Office Visit) with Jama, Cordella KANDICE, MD.    Past Medical History:  Diagnosis Date   Aortic stenosis    Atrial fibrillation (HCC)    Baker cyst    Left knee   DJD (degenerative joint disease)    knees   Dysrhythmia    Eczema    Erectile dysfunction    Gout    History  of hiatal hernia    History of radiation therapy    Right lung- 04/21/21-05/03/21 Dr. Lynwood Nasuti   HTN (hypertension)    MVP (mitral valve prolapse)    Shortness of breath dyspnea    with activity    Past Surgical History:  Procedure Laterality Date    polyps vocal cord  1994   AORTIC VALVE REPLACEMENT  ~2004   BRONCHIAL BIOPSY  03/07/2021   Procedure: BRONCHIAL BIOPSIES;  Surgeon: Shelah Lamar RAMAN, MD;  Location: [facility] ENDOSCOPY;  Service: Pulmonary;;   BRONCHIAL BIOPSY  11/14/2021   Procedure: BRONCHIAL BIOPSIES;  Surgeon: Shelah Lamar RAMAN, MD;  Location: [facility] ENDOSCOPY;  Service: Pulmonary;;   BRONCHIAL BRUSHINGS  03/07/2021   Procedure: BRONCHIAL BRUSHINGS;  Surgeon: Shelah Lamar RAMAN, MD;  Location: [facility] ENDOSCOPY;  Service: Pulmonary;;   BRONCHIAL BRUSHINGS  11/14/2021   Procedure: BRONCHIAL BRUSHINGS;  Surgeon: Shelah Lamar RAMAN, MD;  Location: [facility] ENDOSCOPY;  Service: Pulmonary;;   BRONCHIAL NEEDLE ASPIRATION BIOPSY  03/07/2021   Procedure: BRONCHIAL NEEDLE ASPIRATION BIOPSIES;  Surgeon: Shelah Lamar RAMAN, MD;  Location: [facility] ENDOSCOPY;  Service: Pulmonary;;   BRONCHIAL NEEDLE ASPIRATION BIOPSY  11/14/2021   Procedure:  BRONCHIAL NEEDLE ASPIRATION BIOPSIES;  Surgeon: Shelah Lamar RAMAN, MD;  Location: [facility] ENDOSCOPY;  Service: Pulmonary;;   BRONCHIAL WASHINGS  09/21/2020   Procedure: BRONCHIAL WASHINGS;  Surgeon: Kara Dorn NOVAK, MD;  Location: [facility] ENDOSCOPY;  Service: Pulmonary;;  BAL    BRONCHIAL WASHINGS  03/07/2021   Procedure: BRONCHIAL WASHINGS;  Surgeon: Shelah Lamar RAMAN, MD;  Location: [facility] ENDOSCOPY;  Service: Pulmonary;;   BRONCHIAL WASHINGS  11/14/2021   Procedure: BRONCHIAL WASHINGS;  Surgeon: Shelah Lamar RAMAN, MD;  Location: [facility] ENDOSCOPY;  Service: Pulmonary;;   FIDUCIAL MARKER PLACEMENT  03/07/2021   Procedure: FIDUCIAL MARKER PLACEMENT;  Surgeon: Shelah Lamar RAMAN, MD;  Location: [facility] ENDOSCOPY;  Service: Pulmonary;;   HERNIA REPAIR  [phone]   HERNIA REPAIR  [phone]   IR ANGIOGRAM PELVIS SELECTIVE  OR SUPRASELECTIVE  12/01/2022   IR ANGIOGRAM PELVIS SELECTIVE OR SUPRASELECTIVE  12/01/2022   IR ANGIOGRAM SELECTIVE EACH ADDITIONAL VESSEL  12/01/2022   IR ANGIOGRAM SELECTIVE EACH ADDITIONAL VESSEL  12/01/2022   IR CT SPINE LTD  12/01/2022   IR CT SPINE LTD  12/01/2022   IR CT SPINE LTD  12/01/2022   IR EMBO ARTERIAL NOT HEMORR HEMANG INC GUIDE ROADMAPPING  12/01/2022   IR EMBO TUMOR ORGAN ISCHEMIA INFARCT INC GUIDE ROADMAPPING  12/01/2022   IR RADIOLOGIST EVAL & MGMT  11/17/2022   IR US  GUIDE VASC ACCESS LEFT  12/01/2022   IR US  GUIDE VASC ACCESS RIGHT  12/01/2022   LUMBAR LAMINECTOMY/DECOMPRESSION MICRODISCECTOMY N/A 01/15/2015   Procedure: 2 LEVEL DECOMPRESSIVE LUMBAR LAMINECTOMY L3-L4,L4-L5;  Surgeon: Tanda Heading, MD;  Location: [facility] ORS;  Service: Orthopedics;  Laterality: N/A;   LUMBAR LAMINECTOMY/DECOMPRESSION MICRODISCECTOMY N/A 05/01/2017   Procedure: Lumbar one-Lumbar two, Lumbar two-Lumbar three, Lumbar three-Lumbar four Laminectomy; Evacuation of hematoma;  Surgeon: Lanis Pupa, MD;  Location: [facility] OR;  Service: Neurosurgery;  Laterality: N/A;   right total hip  ~4 years ago   ROTATOR CUFF REPAIR Left ~2009   TOTAL KNEE ARTHROPLASTY Left 04/14/2015   Procedure: LEFT TOTAL KNEE ARTHROPLASTY;  Surgeon: Tanda Heading, MD;  Location: [facility] ORS;  Service: Orthopedics;  Laterality: Left;   VIDEO BRONCHOSCOPY N/A 09/21/2020   Procedure: VIDEO BRONCHOSCOPY WITHOUT FLUORO;  Surgeon: Kara Dorn NOVAK, MD;  Location: [facility] ENDOSCOPY;  Service: Pulmonary;  Laterality: N/A;   VIDEO BRONCHOSCOPY WITH ENDOBRONCHIAL NAVIGATION Right 03/07/2021   Procedure: VIDEO BRONCHOSCOPY WITH ENDOBRONCHIAL NAVIGATION;  Surgeon: Shelah Lamar RAMAN, MD;  Location: [facility] ENDOSCOPY;  Service: Pulmonary;  Laterality: Right;   VIDEO BRONCHOSCOPY WITH RADIAL ENDOBRONCHIAL ULTRASOUND  11/14/2021   Procedure: VIDEO BRONCHOSCOPY WITH RADIAL ENDOBRONCHIAL ULTRASOUND;  Surgeon: Shelah Lamar RAMAN, MD;  Location: [facility] ENDOSCOPY;  Service: Pulmonary;;     Social History Social History   Tobacco Use   Smoking status: Former    Current packs/day: 0.00    Types: Cigarettes    Start date: 08/28/1972    Quit date: 08/28/2002    Years since quitting: 21.4   Smokeless tobacco: Never  Vaping Use   Vaping status: Never Used  Substance Use Topics   Alcohol use: Yes    Alcohol/week: 3.0 standard drinks of alcohol    Types: 3 Shots of liquor per week    Comment: social   Drug use: No    Family History Family History  Problem Relation Age of Onset   Diabetes Other    Stroke Other     No Known Allergies   REVIEW OF SYSTEMS (Negative unless checked)  Constitutional: [] Weight loss  [] Fever  []   Chills Cardiac: [] Chest pain   [] Chest pressure   [] Palpitations   [] Shortness of breath when laying flat   [] Shortness of breath with exertion. Vascular:  [x] Pain in legs with walking   [] Pain in legs at rest  [] History of DVT   [] Phlebitis   [] Swelling in legs   [] Varicose veins   [] Non-healing ulcers Pulmonary:   [] Uses home oxygen   [] Productive cough   [] Hemoptysis   [] Wheeze  [x] COPD   [] Asthma Neurologic:  [] Dizziness   [] Seizures   [] History of stroke   [] History of TIA  [] Aphasia   [] Vissual changes   [] Weakness or numbness in arm   [] Weakness or numbness in leg Musculoskeletal:   [] Joint swelling   [x] Joint pain   [x] Low back pain Hematologic:  [] Easy bruising  [] Easy bleeding   [] Hypercoagulable state   [] Anemic Gastrointestinal:  [] Diarrhea   [] Vomiting  [] Gastroesophageal reflux/heartburn   [] Difficulty swallowing. Genitourinary:  [] Chronic kidney disease   [] Difficult urination  [] Frequent urination   [] Blood in urine Skin:  [] Rashes   [] Ulcers  Psychological:  [] History of anxiety   []  History of major depression.  Physical Examination  Vitals:   02/14/24 1042  BP: 127/74  Pulse: 91  Weight: 160 lb 3 oz (72.7 kg)  Height: 5' 3 (1.6 m)   Body mass index is 28.38 kg/m. Gen: WD/WN, NAD Head: [place]/AT, No temporalis wasting.   Ear/Nose/Throat: Hearing grossly intact, nares w/o erythema or drainage Eyes: PER, EOMI, sclera nonicteric.  Neck: Supple, no masses.  No bruit or JVD.  Pulmonary:  Good air movement, no audible wheezing, no use of accessory muscles.  Cardiac: RRR, normal S1, S2, no Murmurs. Vascular:  mild trophic changes, no open wounds Vessel Right Left  Radial Palpable Palpable  PT Not Palpable Not Palpable  DP Not Palpable Not Palpable  Gastrointestinal: soft, non-distended. No guarding/no peritoneal signs.  Musculoskeletal: M/S 5/5 throughout.  No visible deformity.  Neurologic: CN 2-12 intact. Pain and light touch intact in extremities.  Symmetrical.  Speech is fluent. Motor exam as listed above. Psychiatric: Judgment intact, Mood & affect appropriate for pt's clinical situation. Dermatologic: No rashes or ulcers noted.  No changes consistent with cellulitis.   CBC Lab Results  Component Value Date   WBC 10.7 (H) 10/30/2023   HGB 12.2 (L) 10/30/2023   HCT 37.7 (L) 10/30/2023   MCV 99.3 10/30/2023   PLT 239.0 10/30/2023    BMET    Component Value Date/Time   NA 140 10/30/2023 1036   NA 141 07/06/2023 1115   K 4.0 10/30/2023 1036   CL 98 10/30/2023 1036   CO2 34 (H) 10/30/2023 1036   GLUCOSE 94 10/30/2023 1036   BUN 29 (H) 10/30/2023 1036   BUN 18 07/06/2023 1115   CREATININE 1.23 10/30/2023 1036   CREATININE 0.86 03/23/2015 1019   CALCIUM 9.0 10/30/2023 1036   GFRNONAA >60 09/21/2023 0506   GFRNONAA 84 03/23/2015 1019   GFRAA 82 04/02/2020 1019   GFRAA >89 03/23/2015 1019   CrCl cannot be calculated (Patient's most recent lab result is older than the maximum 21 days allowed.).  COAG Lab Results  Component Value Date   INR 1.4 (A) 02/01/2024   INR 1.7 (A) 12/25/2023   INR 2.2 11/27/2023    Radiology VAS US  ABI WITH/WO TBI Result Date: 02/05/2024  LOWER EXTREMITY DOPPLER STUDY Patient Name:  JORDANY RUSSETT  Date of Exam:   02/05/2024 Medical Rec #: [id number]  Accession #:    [id number] Date of Birth: 1936/06/08         Patient Gender: M Patient Age:   87 years Exam [facility] Procedure:      VAS US  ABI WITH/WO TBI Referring Phys: --------------------------------------------------------------------------------  Indications: Ulceration.  Performing Technologist: Elsie Churn RT, RDMS, RVT  Examination Guidelines: A complete evaluation includes at minimum, Doppler waveform signals and systolic blood pressure reading at the level of bilateral brachial, anterior tibial, and posterior tibial arteries, when vessel segments are accessible. Bilateral testing is considered an integral part of a complete examination. Photoelectric Plethysmograph (PPG) waveforms and toe systolic pressure readings are included as required and additional duplex testing as needed. Limited examinations for reoccurring indications may be performed as noted.  ABI Findings: +---------+------------------+-----+--------+----------------+ Right    Rt Pressure (mmHg)IndexWaveformComment          +---------+------------------+-----+--------+----------------+ Brachial 148                                             +---------+------------------+-----+--------+----------------+ CFA                             biphasic                 +---------+------------------+-----+--------+----------------+ Popliteal                       biphasic                 +---------+------------------+-----+--------+----------------+ PTA                             biphasicnon-compressible +---------+------------------+-----+--------+----------------+ DP       175               1.14 biphasic                 +---------+------------------+-----+--------+----------------+ Great Toe111               0.72 Normal                   +---------+------------------+-----+--------+----------------+ +---------+------------------+-----+---------------+-------+ Left     Lt  Pressure (mmHg)IndexWaveform       Comment +---------+------------------+-----+---------------+-------+ Brachial 154                                           +---------+------------------+-----+---------------+-------+ CFA                             biphasic               +---------+------------------+-----+---------------+-------+ Popliteal                       monophasic             +---------+------------------+-----+---------------+-------+ PTA      149               0.97 monophasic             +---------+------------------+-----+---------------+-------+ DP       153  0.99 monophasic             +---------+------------------+-----+---------------+-------+ Great Toe82                0.53 mildly abnormal        +---------+------------------+-----+---------------+-------+ Bilateral ankle pressures may be unreliable due to medial calcification.  Summary: Right: Resting right ankle-brachial index indicates noncompressible right lower extremity arteries. Doppler waveforms and normal TBI suggest normal arterial perfusion to the left lower extremity. Left: Resting left ankle-brachial index is within normal range, however, this may be due to medial calcification when compared to the Doppler waveforms. The Doppler waveforms and the abnormal TBI suggest significant femoral-popliteal level arterial occlusive disease. *See table(s) above for measurements and observations.  Suggest Peripheral Vascular Consult. Electronically signed by Selinda Gu MD on 02/05/2024 at 12:07:15 PM.    Final      Assessment/Plan 1. Atherosclerosis of native artery of both lower extremities with intermittent claudication (HCC) (Primary)  Recommend:  The patient has evidence of atherosclerosis of the lower extremities with claudication.  The patient does voice lifestyle limiting changes at this point in time.  Noninvasive studies suggest clinically significant disease.  I did discuss  angiography given his lifestyle limiting symptoms.  He wishes to consider this.  Therefore he will follow-up in 1 month.  No invasive studies at this time, no angiography or surgery at this time The patient should continue walking and begin a more formal exercise program.  The patient should continue antiplatelet therapy and aggressive treatment of the lipid abnormalities  No changes in the patient's medications at this time  Continued surveillance is indicated as atherosclerosis is likely to progress with time.    2. Essential hypertension Continue antihypertensive medications as already ordered, these medications have been reviewed and there are no changes at this time.  3. Centrilobular emphysema (HCC) Continue pulmonary medications and aerosols as already ordered, these medications have been reviewed and there are no changes at this time.   4. Spinal stenosis, lumbar region, with neurogenic claudication Continue medications to treat the patient's degenerative disease as already ordered, these medications have been reviewed and there are no changes at this time.  Continued activity and therapy was stressed.    Cordella Shawl, MD  02/14/2024 10:55 AM

## 2024-02-15 ENCOUNTER — Ambulatory Visit

## 2024-02-15 DIAGNOSIS — Z7901 Long term (current) use of anticoagulants: Secondary | ICD-10-CM

## 2024-02-15 LAB — POCT INR: INR: 2.3 (ref 2.0–3.0)

## 2024-02-15 NOTE — Progress Notes (Signed)
 Pt finished keflex one week ago.  Continue 1/2 tablet daily except take 1 tablet on Wednesday. Recheck in 4 week.

## 2024-02-15 NOTE — Patient Instructions (Addendum)
 Pre visit review using our clinic review tool, if applicable. No additional management support is needed unless otherwise documented below in the visit note.  Continue 1/2 tablet daily except take 1 tablet on Wednesday. Recheck in 4 week.

## 2024-02-16 ENCOUNTER — Encounter (INDEPENDENT_AMBULATORY_CARE_PROVIDER_SITE_OTHER): Payer: Self-pay | Admitting: Vascular Surgery

## 2024-02-16 DIAGNOSIS — I70219 Atherosclerosis of native arteries of extremities with intermittent claudication, unspecified extremity: Secondary | ICD-10-CM | POA: Insufficient documentation

## 2024-02-19 ENCOUNTER — Encounter: Admitting: Physician Assistant

## 2024-02-19 DIAGNOSIS — S31811A Laceration without foreign body of right buttock, initial encounter: Secondary | ICD-10-CM | POA: Diagnosis not present

## 2024-02-19 DIAGNOSIS — I480 Paroxysmal atrial fibrillation: Secondary | ICD-10-CM | POA: Diagnosis not present

## 2024-02-19 DIAGNOSIS — S31821A Laceration without foreign body of left buttock, initial encounter: Secondary | ICD-10-CM | POA: Diagnosis not present

## 2024-02-19 DIAGNOSIS — Z7901 Long term (current) use of anticoagulants: Secondary | ICD-10-CM | POA: Diagnosis not present

## 2024-02-19 DIAGNOSIS — L98412 Non-pressure chronic ulcer of buttock with fat layer exposed: Secondary | ICD-10-CM | POA: Diagnosis not present

## 2024-02-19 DIAGNOSIS — I110 Hypertensive heart disease with heart failure: Secondary | ICD-10-CM | POA: Diagnosis not present

## 2024-02-19 DIAGNOSIS — M1000 Idiopathic gout, unspecified site: Secondary | ICD-10-CM | POA: Diagnosis not present

## 2024-02-19 DIAGNOSIS — S81812A Laceration without foreign body, left lower leg, initial encounter: Secondary | ICD-10-CM | POA: Diagnosis not present

## 2024-02-19 DIAGNOSIS — I5042 Chronic combined systolic (congestive) and diastolic (congestive) heart failure: Secondary | ICD-10-CM | POA: Diagnosis not present

## 2024-02-19 DIAGNOSIS — L97823 Non-pressure chronic ulcer of other part of left lower leg with necrosis of muscle: Secondary | ICD-10-CM | POA: Diagnosis not present

## 2024-02-20 DIAGNOSIS — L98412 Non-pressure chronic ulcer of buttock with fat layer exposed: Secondary | ICD-10-CM | POA: Diagnosis not present

## 2024-02-20 DIAGNOSIS — I480 Paroxysmal atrial fibrillation: Secondary | ICD-10-CM | POA: Diagnosis not present

## 2024-02-26 ENCOUNTER — Encounter: Attending: Physician Assistant | Admitting: Physician Assistant

## 2024-02-26 DIAGNOSIS — S31821A Laceration without foreign body of left buttock, initial encounter: Secondary | ICD-10-CM | POA: Diagnosis not present

## 2024-02-26 DIAGNOSIS — Z7901 Long term (current) use of anticoagulants: Secondary | ICD-10-CM | POA: Insufficient documentation

## 2024-02-26 DIAGNOSIS — I5042 Chronic combined systolic (congestive) and diastolic (congestive) heart failure: Secondary | ICD-10-CM | POA: Insufficient documentation

## 2024-02-26 DIAGNOSIS — L97823 Non-pressure chronic ulcer of other part of left lower leg with necrosis of muscle: Secondary | ICD-10-CM | POA: Insufficient documentation

## 2024-02-26 DIAGNOSIS — M1000 Idiopathic gout, unspecified site: Secondary | ICD-10-CM | POA: Insufficient documentation

## 2024-02-26 DIAGNOSIS — I110 Hypertensive heart disease with heart failure: Secondary | ICD-10-CM | POA: Insufficient documentation

## 2024-02-26 DIAGNOSIS — X58XXXA Exposure to other specified factors, initial encounter: Secondary | ICD-10-CM | POA: Insufficient documentation

## 2024-02-26 DIAGNOSIS — S41111A Laceration without foreign body of right upper arm, initial encounter: Secondary | ICD-10-CM | POA: Insufficient documentation

## 2024-02-26 DIAGNOSIS — S31811A Laceration without foreign body of right buttock, initial encounter: Secondary | ICD-10-CM | POA: Diagnosis not present

## 2024-02-26 DIAGNOSIS — S81812A Laceration without foreign body, left lower leg, initial encounter: Secondary | ICD-10-CM | POA: Diagnosis not present

## 2024-02-26 DIAGNOSIS — L98412 Non-pressure chronic ulcer of buttock with fat layer exposed: Secondary | ICD-10-CM | POA: Diagnosis not present

## 2024-02-26 DIAGNOSIS — I480 Paroxysmal atrial fibrillation: Secondary | ICD-10-CM | POA: Insufficient documentation

## 2024-03-04 ENCOUNTER — Encounter: Admitting: Physician Assistant

## 2024-03-04 DIAGNOSIS — S41111A Laceration without foreign body of right upper arm, initial encounter: Secondary | ICD-10-CM | POA: Diagnosis not present

## 2024-03-04 DIAGNOSIS — L98412 Non-pressure chronic ulcer of buttock with fat layer exposed: Secondary | ICD-10-CM | POA: Diagnosis not present

## 2024-03-04 DIAGNOSIS — Z7901 Long term (current) use of anticoagulants: Secondary | ICD-10-CM | POA: Diagnosis not present

## 2024-03-04 DIAGNOSIS — I110 Hypertensive heart disease with heart failure: Secondary | ICD-10-CM | POA: Diagnosis not present

## 2024-03-04 DIAGNOSIS — I5042 Chronic combined systolic (congestive) and diastolic (congestive) heart failure: Secondary | ICD-10-CM | POA: Diagnosis not present

## 2024-03-04 DIAGNOSIS — L97823 Non-pressure chronic ulcer of other part of left lower leg with necrosis of muscle: Secondary | ICD-10-CM | POA: Diagnosis not present

## 2024-03-04 DIAGNOSIS — S31821A Laceration without foreign body of left buttock, initial encounter: Secondary | ICD-10-CM | POA: Diagnosis not present

## 2024-03-04 DIAGNOSIS — S81812A Laceration without foreign body, left lower leg, initial encounter: Secondary | ICD-10-CM | POA: Diagnosis not present

## 2024-03-04 DIAGNOSIS — M1000 Idiopathic gout, unspecified site: Secondary | ICD-10-CM | POA: Diagnosis not present

## 2024-03-04 DIAGNOSIS — I480 Paroxysmal atrial fibrillation: Secondary | ICD-10-CM | POA: Diagnosis not present

## 2024-03-04 DIAGNOSIS — S81811A Laceration without foreign body, right lower leg, initial encounter: Secondary | ICD-10-CM | POA: Diagnosis not present

## 2024-03-05 DIAGNOSIS — I480 Paroxysmal atrial fibrillation: Secondary | ICD-10-CM | POA: Diagnosis not present

## 2024-03-05 DIAGNOSIS — S81812A Laceration without foreign body, left lower leg, initial encounter: Secondary | ICD-10-CM | POA: Diagnosis not present

## 2024-03-11 ENCOUNTER — Encounter: Admitting: Physician Assistant

## 2024-03-11 DIAGNOSIS — Z7901 Long term (current) use of anticoagulants: Secondary | ICD-10-CM | POA: Diagnosis not present

## 2024-03-11 DIAGNOSIS — L97823 Non-pressure chronic ulcer of other part of left lower leg with necrosis of muscle: Secondary | ICD-10-CM | POA: Diagnosis not present

## 2024-03-11 DIAGNOSIS — S51011A Laceration without foreign body of right elbow, initial encounter: Secondary | ICD-10-CM | POA: Diagnosis not present

## 2024-03-11 DIAGNOSIS — S41111A Laceration without foreign body of right upper arm, initial encounter: Secondary | ICD-10-CM | POA: Diagnosis not present

## 2024-03-11 DIAGNOSIS — S81812A Laceration without foreign body, left lower leg, initial encounter: Secondary | ICD-10-CM | POA: Diagnosis not present

## 2024-03-11 DIAGNOSIS — I110 Hypertensive heart disease with heart failure: Secondary | ICD-10-CM | POA: Diagnosis not present

## 2024-03-11 DIAGNOSIS — I480 Paroxysmal atrial fibrillation: Secondary | ICD-10-CM | POA: Diagnosis not present

## 2024-03-11 DIAGNOSIS — L98412 Non-pressure chronic ulcer of buttock with fat layer exposed: Secondary | ICD-10-CM | POA: Diagnosis not present

## 2024-03-11 DIAGNOSIS — S31821A Laceration without foreign body of left buttock, initial encounter: Secondary | ICD-10-CM | POA: Diagnosis not present

## 2024-03-11 DIAGNOSIS — M1000 Idiopathic gout, unspecified site: Secondary | ICD-10-CM | POA: Diagnosis not present

## 2024-03-11 DIAGNOSIS — I5042 Chronic combined systolic (congestive) and diastolic (congestive) heart failure: Secondary | ICD-10-CM | POA: Diagnosis not present

## 2024-03-12 ENCOUNTER — Other Ambulatory Visit: Payer: Self-pay | Admitting: Adult Health

## 2024-03-12 DIAGNOSIS — M150 Primary generalized (osteo)arthritis: Secondary | ICD-10-CM

## 2024-03-13 ENCOUNTER — Ambulatory Visit (INDEPENDENT_AMBULATORY_CARE_PROVIDER_SITE_OTHER): Admitting: Vascular Surgery

## 2024-03-14 ENCOUNTER — Telehealth: Payer: Self-pay

## 2024-03-14 ENCOUNTER — Ambulatory Visit

## 2024-03-14 DIAGNOSIS — Z7901 Long term (current) use of anticoagulants: Secondary | ICD-10-CM

## 2024-03-14 LAB — POCT INR: INR: 3 (ref 2.0–3.0)

## 2024-03-14 NOTE — Patient Instructions (Addendum)
 Pre visit review using our clinic review tool, if applicable. No additional management support is needed unless otherwise documented below in the visit note.  Hold dose tomorrow and then change weekly dose to take 1/2 tablet daily. Recheck in 4 week.

## 2024-03-14 NOTE — Progress Notes (Signed)
 Pt had another recent fall. Abrasions to both arms. Pt already visits wound care regularly for a laceration on his leg from a fall. Pt reports he has had 3 falls in the last 2 months. He reports his wound care provider advised he have his INR range reduced to 1.5-2.0 and that this would also help with healing. Advised pt a msg will be sent to PCP to advise concerning INR range change. Pt denies any bleeding or abnormal bruising currently.  Pt already took warfarin today. Hold dose tomorrow and then change weekly dose to take 1/2 tablet daily. Recheck in 4 week.   Sent msg to PCP.

## 2024-03-14 NOTE — Telephone Encounter (Signed)
 Pt in coumadin  clinic today. He reported he had another recent fall. Abrasions to both arms. Pt already visits wound care regularly for a laceration on his leg from a fall. Pt reports he has had 3 falls in the last 2 months.  He reports his wound care provider advised he have his INR range reduced to 1.5-2.0 and that this would also help with healing. Currently range is 2.0-2.5. Lowered from 2.0-3.0 due to years of hemoptysis. Pt reports he no longer has any hemoptysis.  Pt reports he is using a walker now but he did have his last fall while using his walker on a wet surface around the pool.    Advised pt a msg will be sent to PCP to advise concerning INR range change. Pt denies any bleeding or abnormal bruising currently.   Indication for anticoagulation is permanent a fib. Eliquis  and Xarelto  were discussed with the pt by cardiology, Dr. Pietro, but cost prohibitive for the pt.

## 2024-03-18 ENCOUNTER — Encounter: Admitting: Physician Assistant

## 2024-03-18 DIAGNOSIS — M1000 Idiopathic gout, unspecified site: Secondary | ICD-10-CM | POA: Diagnosis not present

## 2024-03-18 DIAGNOSIS — I110 Hypertensive heart disease with heart failure: Secondary | ICD-10-CM | POA: Diagnosis not present

## 2024-03-18 DIAGNOSIS — S81812A Laceration without foreign body, left lower leg, initial encounter: Secondary | ICD-10-CM | POA: Diagnosis not present

## 2024-03-18 DIAGNOSIS — Z7901 Long term (current) use of anticoagulants: Secondary | ICD-10-CM | POA: Diagnosis not present

## 2024-03-18 DIAGNOSIS — L97823 Non-pressure chronic ulcer of other part of left lower leg with necrosis of muscle: Secondary | ICD-10-CM | POA: Diagnosis not present

## 2024-03-18 DIAGNOSIS — S31821A Laceration without foreign body of left buttock, initial encounter: Secondary | ICD-10-CM | POA: Diagnosis not present

## 2024-03-18 DIAGNOSIS — I480 Paroxysmal atrial fibrillation: Secondary | ICD-10-CM | POA: Diagnosis not present

## 2024-03-18 DIAGNOSIS — S41111A Laceration without foreign body of right upper arm, initial encounter: Secondary | ICD-10-CM | POA: Diagnosis not present

## 2024-03-18 DIAGNOSIS — L98412 Non-pressure chronic ulcer of buttock with fat layer exposed: Secondary | ICD-10-CM | POA: Diagnosis not present

## 2024-03-18 DIAGNOSIS — I5042 Chronic combined systolic (congestive) and diastolic (congestive) heart failure: Secondary | ICD-10-CM | POA: Diagnosis not present

## 2024-03-19 NOTE — Telephone Encounter (Signed)
 Spoke with pt, Follow up scheduled

## 2024-03-25 ENCOUNTER — Encounter: Admitting: Physician Assistant

## 2024-03-25 DIAGNOSIS — S31821A Laceration without foreign body of left buttock, initial encounter: Secondary | ICD-10-CM | POA: Diagnosis not present

## 2024-03-25 DIAGNOSIS — M1000 Idiopathic gout, unspecified site: Secondary | ICD-10-CM | POA: Diagnosis not present

## 2024-03-25 DIAGNOSIS — S81812A Laceration without foreign body, left lower leg, initial encounter: Secondary | ICD-10-CM | POA: Diagnosis not present

## 2024-03-25 DIAGNOSIS — I480 Paroxysmal atrial fibrillation: Secondary | ICD-10-CM | POA: Diagnosis not present

## 2024-03-25 DIAGNOSIS — Z7901 Long term (current) use of anticoagulants: Secondary | ICD-10-CM | POA: Diagnosis not present

## 2024-03-25 DIAGNOSIS — S41111A Laceration without foreign body of right upper arm, initial encounter: Secondary | ICD-10-CM | POA: Diagnosis not present

## 2024-03-25 DIAGNOSIS — L97823 Non-pressure chronic ulcer of other part of left lower leg with necrosis of muscle: Secondary | ICD-10-CM | POA: Diagnosis not present

## 2024-03-25 DIAGNOSIS — I5042 Chronic combined systolic (congestive) and diastolic (congestive) heart failure: Secondary | ICD-10-CM | POA: Diagnosis not present

## 2024-03-25 DIAGNOSIS — I110 Hypertensive heart disease with heart failure: Secondary | ICD-10-CM | POA: Diagnosis not present

## 2024-03-25 DIAGNOSIS — L98412 Non-pressure chronic ulcer of buttock with fat layer exposed: Secondary | ICD-10-CM | POA: Diagnosis not present

## 2024-03-25 NOTE — Telephone Encounter (Signed)
 [name]

## 2024-03-26 ENCOUNTER — Other Ambulatory Visit: Payer: Self-pay | Admitting: Internal Medicine

## 2024-03-26 DIAGNOSIS — G4700 Insomnia, unspecified: Secondary | ICD-10-CM

## 2024-03-26 DIAGNOSIS — S81812A Laceration without foreign body, left lower leg, initial encounter: Secondary | ICD-10-CM | POA: Diagnosis not present

## 2024-03-26 DIAGNOSIS — Z7901 Long term (current) use of anticoagulants: Secondary | ICD-10-CM | POA: Diagnosis not present

## 2024-03-26 DIAGNOSIS — I480 Paroxysmal atrial fibrillation: Secondary | ICD-10-CM | POA: Diagnosis not present

## 2024-03-28 ENCOUNTER — Telehealth: Payer: Self-pay

## 2024-03-28 ENCOUNTER — Ambulatory Visit

## 2024-03-28 NOTE — Telephone Encounter (Signed)
 Pt LVM he needs to cancel coumadin  clinic apt due to not feeling well.   LVM for pt to return call to RS.

## 2024-03-31 NOTE — Telephone Encounter (Signed)
 Contacted pt who reports he is feeling much better. He was having a lot of knee pain. Scheduled for coumadin  clinic for tomorrow. Pt verbalized understanding.

## 2024-04-01 ENCOUNTER — Ambulatory Visit (INDEPENDENT_AMBULATORY_CARE_PROVIDER_SITE_OTHER)

## 2024-04-01 DIAGNOSIS — Z7901 Long term (current) use of anticoagulants: Secondary | ICD-10-CM

## 2024-04-01 LAB — POCT INR: INR: 2.6 (ref 2.0–3.0)

## 2024-04-01 NOTE — Patient Instructions (Addendum)
 Pre visit review using our clinic review tool, if applicable. No additional management support is needed unless otherwise documented below in the visit note.  Continue 1/2 tablet daily. Recheck in 2 week. Follow up with the coumadin  clinic on Monday, 8/11, after our appointment with cardiology.

## 2024-04-01 NOTE — Progress Notes (Signed)
 Pt had another recent fall. Abrasions to both arms. Pt already visits wound care regularly for a laceration on his leg from a fall. Pt reports he has had 3 falls in the last 2 months. He reports his wound care provider advised he have his INR range reduced to 1.5-2.0 and that this would also help with healing. A msg was sent to PCP and cardiology after last INR check. Pt has a f/u apt with cardiology on 8/11 to discuss risk and benefit of coumadin  and possible consideration of watchman. Pt already took warfarin today.  Pt is slightly over INR range today. Advised to eat some greens today and then continue current dosing. Advised will f/u with him after his apt with cardiology on 8/11. Eat some greens today. Continue 1/2 tablet daily. Recheck in 2 week. Follow up with the coumadin  clinic on Monday, 8/11, after our appointment with cardiology.

## 2024-04-03 ENCOUNTER — Encounter: Attending: Physician Assistant | Admitting: Physician Assistant

## 2024-04-03 DIAGNOSIS — L97823 Non-pressure chronic ulcer of other part of left lower leg with necrosis of muscle: Secondary | ICD-10-CM | POA: Insufficient documentation

## 2024-04-03 DIAGNOSIS — S41111A Laceration without foreign body of right upper arm, initial encounter: Secondary | ICD-10-CM | POA: Diagnosis not present

## 2024-04-03 DIAGNOSIS — I480 Paroxysmal atrial fibrillation: Secondary | ICD-10-CM | POA: Diagnosis not present

## 2024-04-03 DIAGNOSIS — S31821A Laceration without foreign body of left buttock, initial encounter: Secondary | ICD-10-CM | POA: Diagnosis not present

## 2024-04-03 DIAGNOSIS — M1000 Idiopathic gout, unspecified site: Secondary | ICD-10-CM | POA: Insufficient documentation

## 2024-04-03 DIAGNOSIS — L98412 Non-pressure chronic ulcer of buttock with fat layer exposed: Secondary | ICD-10-CM | POA: Diagnosis not present

## 2024-04-03 DIAGNOSIS — S81812A Laceration without foreign body, left lower leg, initial encounter: Secondary | ICD-10-CM | POA: Insufficient documentation

## 2024-04-03 DIAGNOSIS — I10 Essential (primary) hypertension: Secondary | ICD-10-CM | POA: Diagnosis not present

## 2024-04-03 DIAGNOSIS — Z7901 Long term (current) use of anticoagulants: Secondary | ICD-10-CM | POA: Insufficient documentation

## 2024-04-03 DIAGNOSIS — I5042 Chronic combined systolic (congestive) and diastolic (congestive) heart failure: Secondary | ICD-10-CM | POA: Insufficient documentation

## 2024-04-07 ENCOUNTER — Ambulatory Visit: Admitting: Physician Assistant

## 2024-04-08 ENCOUNTER — Encounter: Admitting: Physician Assistant

## 2024-04-08 DIAGNOSIS — I10 Essential (primary) hypertension: Secondary | ICD-10-CM | POA: Diagnosis not present

## 2024-04-08 DIAGNOSIS — I480 Paroxysmal atrial fibrillation: Secondary | ICD-10-CM | POA: Diagnosis not present

## 2024-04-08 DIAGNOSIS — L98412 Non-pressure chronic ulcer of buttock with fat layer exposed: Secondary | ICD-10-CM | POA: Diagnosis not present

## 2024-04-08 DIAGNOSIS — S31821A Laceration without foreign body of left buttock, initial encounter: Secondary | ICD-10-CM | POA: Diagnosis not present

## 2024-04-08 DIAGNOSIS — Z7901 Long term (current) use of anticoagulants: Secondary | ICD-10-CM | POA: Diagnosis not present

## 2024-04-08 DIAGNOSIS — S41111A Laceration without foreign body of right upper arm, initial encounter: Secondary | ICD-10-CM | POA: Diagnosis not present

## 2024-04-08 DIAGNOSIS — M1000 Idiopathic gout, unspecified site: Secondary | ICD-10-CM | POA: Diagnosis not present

## 2024-04-08 DIAGNOSIS — I5042 Chronic combined systolic (congestive) and diastolic (congestive) heart failure: Secondary | ICD-10-CM | POA: Diagnosis not present

## 2024-04-08 DIAGNOSIS — L97823 Non-pressure chronic ulcer of other part of left lower leg with necrosis of muscle: Secondary | ICD-10-CM | POA: Diagnosis not present

## 2024-04-08 DIAGNOSIS — S81812A Laceration without foreign body, left lower leg, initial encounter: Secondary | ICD-10-CM | POA: Diagnosis not present

## 2024-04-15 ENCOUNTER — Ambulatory Visit (INDEPENDENT_AMBULATORY_CARE_PROVIDER_SITE_OTHER)

## 2024-04-15 ENCOUNTER — Encounter: Admitting: Physician Assistant

## 2024-04-15 DIAGNOSIS — I5042 Chronic combined systolic (congestive) and diastolic (congestive) heart failure: Secondary | ICD-10-CM | POA: Diagnosis not present

## 2024-04-15 DIAGNOSIS — I10 Essential (primary) hypertension: Secondary | ICD-10-CM | POA: Diagnosis not present

## 2024-04-15 DIAGNOSIS — S81812A Laceration without foreign body, left lower leg, initial encounter: Secondary | ICD-10-CM | POA: Diagnosis not present

## 2024-04-15 DIAGNOSIS — L98412 Non-pressure chronic ulcer of buttock with fat layer exposed: Secondary | ICD-10-CM | POA: Diagnosis not present

## 2024-04-15 DIAGNOSIS — Z7901 Long term (current) use of anticoagulants: Secondary | ICD-10-CM

## 2024-04-15 DIAGNOSIS — S41111A Laceration without foreign body of right upper arm, initial encounter: Secondary | ICD-10-CM | POA: Diagnosis not present

## 2024-04-15 DIAGNOSIS — L97823 Non-pressure chronic ulcer of other part of left lower leg with necrosis of muscle: Secondary | ICD-10-CM | POA: Diagnosis not present

## 2024-04-15 DIAGNOSIS — I480 Paroxysmal atrial fibrillation: Secondary | ICD-10-CM | POA: Diagnosis not present

## 2024-04-15 DIAGNOSIS — S31821A Laceration without foreign body of left buttock, initial encounter: Secondary | ICD-10-CM | POA: Diagnosis not present

## 2024-04-15 DIAGNOSIS — S81811A Laceration without foreign body, right lower leg, initial encounter: Secondary | ICD-10-CM | POA: Diagnosis not present

## 2024-04-15 DIAGNOSIS — M1000 Idiopathic gout, unspecified site: Secondary | ICD-10-CM | POA: Diagnosis not present

## 2024-04-15 LAB — POCT INR: INR: 2.3 (ref 2.0–3.0)

## 2024-04-15 NOTE — Progress Notes (Signed)
 Pt had another fall before his last apt. Abrasions to both arms. Pt already visits wound care regularly for a laceration on his leg from a fall. Pt reports he has had 3 falls in the last 2 months. He reports his wound care provider advised he have his INR range reduced to 1.5-2.0 and that this would also help with healing.Pt has a f/u apt with cardiology on 8/29 to discuss risk and benefit of coumadin  and possible consideration of watchman. Continue 1/2 tablet daily. Recheck in 2 week. Follow up with the coumadin  clinic on 8/29 after our appointment with cardiology.

## 2024-04-15 NOTE — Patient Instructions (Addendum)
 Pre visit review using our clinic review tool, if applicable. No additional management support is needed unless otherwise documented below in the visit note.  Continue 1/2 tablet daily. Recheck in 2 week. Follow up with the coumadin  clinic on 8/29 after our appointment with cardiology.

## 2024-04-19 ENCOUNTER — Emergency Department (HOSPITAL_COMMUNITY)

## 2024-04-19 ENCOUNTER — Observation Stay (HOSPITAL_COMMUNITY)
Admission: EM | Admit: 2024-04-19 | Discharge: 2024-04-23 | Disposition: A | Discharge status: Skilled Nursing Facility | Attending: Internal Medicine | Admitting: Internal Medicine

## 2024-04-19 ENCOUNTER — Other Ambulatory Visit: Payer: Self-pay

## 2024-04-19 ENCOUNTER — Encounter (HOSPITAL_COMMUNITY): Payer: Self-pay

## 2024-04-19 DIAGNOSIS — R5381 Other malaise: Secondary | ICD-10-CM | POA: Diagnosis not present

## 2024-04-19 DIAGNOSIS — I10 Essential (primary) hypertension: Secondary | ICD-10-CM | POA: Diagnosis present

## 2024-04-19 DIAGNOSIS — I4821 Permanent atrial fibrillation: Secondary | ICD-10-CM | POA: Diagnosis not present

## 2024-04-19 DIAGNOSIS — W19XXXA Unspecified fall, initial encounter: Principal | ICD-10-CM | POA: Insufficient documentation

## 2024-04-19 DIAGNOSIS — Z87891 Personal history of nicotine dependence: Secondary | ICD-10-CM | POA: Diagnosis not present

## 2024-04-19 DIAGNOSIS — F1090 Alcohol use, unspecified, uncomplicated: Secondary | ICD-10-CM | POA: Diagnosis not present

## 2024-04-19 DIAGNOSIS — E860 Dehydration: Secondary | ICD-10-CM

## 2024-04-19 DIAGNOSIS — M7651 Patellar tendinitis, right knee: Secondary | ICD-10-CM | POA: Diagnosis not present

## 2024-04-19 DIAGNOSIS — I5032 Chronic diastolic (congestive) heart failure: Secondary | ICD-10-CM | POA: Diagnosis not present

## 2024-04-19 DIAGNOSIS — J432 Centrilobular emphysema: Secondary | ICD-10-CM | POA: Diagnosis present

## 2024-04-19 DIAGNOSIS — D649 Anemia, unspecified: Secondary | ICD-10-CM | POA: Diagnosis present

## 2024-04-19 DIAGNOSIS — R627 Adult failure to thrive: Secondary | ICD-10-CM | POA: Diagnosis not present

## 2024-04-19 DIAGNOSIS — R17 Unspecified jaundice: Secondary | ICD-10-CM | POA: Diagnosis present

## 2024-04-19 DIAGNOSIS — S8001XA Contusion of right knee, initial encounter: Secondary | ICD-10-CM | POA: Diagnosis present

## 2024-04-19 DIAGNOSIS — S80211A Abrasion, right knee, initial encounter: Secondary | ICD-10-CM | POA: Diagnosis present

## 2024-04-19 DIAGNOSIS — R55 Syncope and collapse: Principal | ICD-10-CM | POA: Diagnosis present

## 2024-04-19 DIAGNOSIS — R509 Fever, unspecified: Secondary | ICD-10-CM | POA: Diagnosis not present

## 2024-04-19 DIAGNOSIS — I672 Cerebral atherosclerosis: Secondary | ICD-10-CM | POA: Diagnosis not present

## 2024-04-19 DIAGNOSIS — M19022 Primary osteoarthritis, left elbow: Secondary | ICD-10-CM | POA: Diagnosis not present

## 2024-04-19 DIAGNOSIS — Z043 Encounter for examination and observation following other accident: Secondary | ICD-10-CM | POA: Diagnosis not present

## 2024-04-19 DIAGNOSIS — M109 Gout, unspecified: Secondary | ICD-10-CM | POA: Diagnosis not present

## 2024-04-19 DIAGNOSIS — I493 Ventricular premature depolarization: Secondary | ICD-10-CM | POA: Diagnosis present

## 2024-04-19 DIAGNOSIS — M1711 Unilateral primary osteoarthritis, right knee: Secondary | ICD-10-CM | POA: Diagnosis not present

## 2024-04-19 DIAGNOSIS — Y92009 Unspecified place in unspecified non-institutional (private) residence as the place of occurrence of the external cause: Secondary | ICD-10-CM | POA: Diagnosis not present

## 2024-04-19 DIAGNOSIS — R600 Localized edema: Secondary | ICD-10-CM | POA: Diagnosis not present

## 2024-04-19 DIAGNOSIS — E559 Vitamin D deficiency, unspecified: Secondary | ICD-10-CM | POA: Diagnosis not present

## 2024-04-19 DIAGNOSIS — R609 Edema, unspecified: Secondary | ICD-10-CM

## 2024-04-19 DIAGNOSIS — M150 Primary generalized (osteo)arthritis: Secondary | ICD-10-CM

## 2024-04-19 DIAGNOSIS — S80212A Abrasion, left knee, initial encounter: Secondary | ICD-10-CM | POA: Diagnosis present

## 2024-04-19 DIAGNOSIS — I110 Hypertensive heart disease with heart failure: Secondary | ICD-10-CM | POA: Diagnosis not present

## 2024-04-19 DIAGNOSIS — E538 Deficiency of other specified B group vitamins: Secondary | ICD-10-CM | POA: Diagnosis present

## 2024-04-19 DIAGNOSIS — M25461 Effusion, right knee: Secondary | ICD-10-CM | POA: Diagnosis not present

## 2024-04-19 DIAGNOSIS — M25561 Pain in right knee: Secondary | ICD-10-CM | POA: Diagnosis not present

## 2024-04-19 DIAGNOSIS — D500 Iron deficiency anemia secondary to blood loss (chronic): Secondary | ICD-10-CM | POA: Diagnosis not present

## 2024-04-19 DIAGNOSIS — S80919A Unspecified superficial injury of unspecified knee, initial encounter: Secondary | ICD-10-CM | POA: Diagnosis not present

## 2024-04-19 LAB — MAGNESIUM: Magnesium: 2.2 mg/dL (ref 1.7–2.4)

## 2024-04-19 LAB — CBC WITH DIFFERENTIAL/PLATELET
Abs Immature Granulocytes: 0.04 K/uL (ref 0.00–0.07)
Basophils Absolute: 0 K/uL (ref 0.0–0.1)
Basophils Relative: 0 %
Eosinophils Absolute: 0 K/uL (ref 0.0–0.5)
Eosinophils Relative: 0 %
HCT: 34.8 % — ABNORMAL LOW (ref 39.0–52.0)
Hemoglobin: 11.2 g/dL — ABNORMAL LOW (ref 13.0–17.0)
Immature Granulocytes: 0 %
Lymphocytes Relative: 5 %
Lymphs Abs: 0.5 K/uL — ABNORMAL LOW (ref 0.7–4.0)
MCH: 30.9 pg (ref 26.0–34.0)
MCHC: 32.2 g/dL (ref 30.0–36.0)
MCV: 96.1 fL (ref 80.0–100.0)
Monocytes Absolute: 1.1 K/uL — ABNORMAL HIGH (ref 0.1–1.0)
Monocytes Relative: 11 %
Neutro Abs: 8.3 K/uL — ABNORMAL HIGH (ref 1.7–7.7)
Neutrophils Relative %: 84 %
Platelets: 157 K/uL (ref 150–400)
RBC: 3.62 MIL/uL — ABNORMAL LOW (ref 4.22–5.81)
RDW: 14.6 % (ref 11.5–15.5)
WBC: 10 K/uL (ref 4.0–10.5)
nRBC: 0 % (ref 0.0–0.2)

## 2024-04-19 LAB — URINALYSIS, ROUTINE W REFLEX MICROSCOPIC
Bilirubin Urine: NEGATIVE
Glucose, UA: NEGATIVE mg/dL
Ketones, ur: 5 mg/dL — AB
Leukocytes,Ua: NEGATIVE
Nitrite: NEGATIVE
Protein, ur: 30 mg/dL — AB
Specific Gravity, Urine: 1.021 (ref 1.005–1.030)
pH: 5 (ref 5.0–8.0)

## 2024-04-19 LAB — COMPREHENSIVE METABOLIC PANEL WITH GFR
ALT: 14 U/L (ref 0–44)
AST: 32 U/L (ref 15–41)
Albumin: 2.9 g/dL — ABNORMAL LOW (ref 3.5–5.0)
Alkaline Phosphatase: 42 U/L (ref 38–126)
Anion gap: 14 (ref 5–15)
BUN: 19 mg/dL (ref 8–23)
CO2: 24 mmol/L (ref 22–32)
Calcium: 8.6 mg/dL — ABNORMAL LOW (ref 8.9–10.3)
Chloride: 95 mmol/L — ABNORMAL LOW (ref 98–111)
Creatinine, Ser: 0.76 mg/dL (ref 0.61–1.24)
GFR, Estimated: >60 mL/min (ref >60)
Glucose, Bld: 80 mg/dL (ref 70–99)
Potassium: 3.9 mmol/L (ref 3.5–5.1)
Sodium: 133 mmol/L — ABNORMAL LOW (ref 135–145)
Total Bilirubin: 3 mg/dL — ABNORMAL HIGH (ref 0.0–1.2)
Total Protein: 6.4 g/dL — ABNORMAL LOW (ref 6.5–8.1)

## 2024-04-19 LAB — PROTIME-INR
INR: 1.2 (ref 0.8–1.2)
Prothrombin Time: 15.9 s — ABNORMAL HIGH (ref 11.4–15.2)

## 2024-04-19 LAB — PHOSPHORUS: Phosphorus: 3.2 mg/dL (ref 2.5–4.6)

## 2024-04-19 LAB — CK: Total CK: 241 U/L (ref 49–397)

## 2024-04-19 MED ORDER — ACETAMINOPHEN 325 MG PO TABS: 650.0000 mg | ORAL_TABLET | Freq: Four times a day (QID) | ORAL | Status: DC | PRN

## 2024-04-19 MED ORDER — ENOXAPARIN SODIUM 40 MG/0.4ML IJ SOSY: 40.0000 mg | PREFILLED_SYRINGE | INTRAMUSCULAR | Status: DC

## 2024-04-19 MED ORDER — TRAZODONE HCL 50 MG PO TABS
50.0000 mg | ORAL_TABLET | Freq: Every evening | ORAL | Status: DC | PRN
  Administered 2024-04-21 – 2024-04-22 (×2): 50 mg via ORAL
  Filled 2024-04-19 (×2): qty 1

## 2024-04-19 MED ORDER — POTASSIUM CHLORIDE CRYS ER 20 MEQ PO TBCR
20.0000 meq | EXTENDED_RELEASE_TABLET | Freq: Every morning | ORAL | Status: DC
  Administered 2024-04-20 – 2024-04-22 (×3): 20 meq via ORAL
  Filled 2024-04-19 (×3): qty 1

## 2024-04-19 MED ORDER — ENSURE PLUS HIGH PROTEIN PO LIQD
237.0000 mL | Freq: Two times a day (BID) | ORAL | Status: DC
  Administered 2024-04-19 – 2024-04-23 (×7): 237 mL via ORAL

## 2024-04-19 MED ORDER — SODIUM CHLORIDE 0.9% FLUSH
3.0000 mL | Freq: Two times a day (BID) | INTRAVENOUS | Status: DC
  Administered 2024-04-19 – 2024-04-22 (×8): 3 mL via INTRAVENOUS

## 2024-04-19 MED ORDER — ONDANSETRON HCL 4 MG PO TABS: 4.0000 mg | ORAL_TABLET | Freq: Four times a day (QID) | ORAL | Status: DC | PRN

## 2024-04-19 MED ORDER — FUROSEMIDE 40 MG PO TABS
60.0000 mg | ORAL_TABLET | Freq: Every morning | ORAL | Status: DC
  Administered 2024-04-20 – 2024-04-21 (×2): 60 mg via ORAL
  Filled 2024-04-19 (×2): qty 1

## 2024-04-19 MED ORDER — OXYCODONE HCL 5 MG PO TABS: 2.5000 mg | ORAL_TABLET | ORAL | Status: DC | PRN

## 2024-04-19 MED ORDER — METOPROLOL TARTRATE 5 MG/5ML IV SOLN
5.0000 mg | Freq: Once | INTRAVENOUS | Status: AC
  Administered 2024-04-19: 5 mg via INTRAVENOUS
  Filled 2024-04-19: qty 5

## 2024-04-19 MED ORDER — ALLOPURINOL 300 MG PO TABS
300.0000 mg | ORAL_TABLET | Freq: Every day | ORAL | Status: DC
  Administered 2024-04-19 – 2024-04-23 (×5): 300 mg via ORAL
  Filled 2024-04-19 (×5): qty 1

## 2024-04-19 MED ORDER — ACETAMINOPHEN 650 MG RE SUPP: 650.0000 mg | Freq: Four times a day (QID) | RECTAL | Status: DC | PRN

## 2024-04-19 MED ORDER — ONDANSETRON HCL 4 MG/2ML IJ SOLN: 4.0000 mg | Freq: Four times a day (QID) | INTRAMUSCULAR | Status: DC | PRN

## 2024-04-19 MED ORDER — WARFARIN SODIUM 2.5 MG PO TABS
2.5000 mg | ORAL_TABLET | Freq: Once | ORAL | Status: AC
  Administered 2024-04-19: 2.5 mg via ORAL
  Filled 2024-04-19: qty 1

## 2024-04-19 MED ORDER — WARFARIN - PHARMACIST DOSING INPATIENT: Freq: Every day | Status: DC

## 2024-04-19 MED ORDER — MAGNESIUM SULFATE IN D5W 1-5 GM/100ML-% IV SOLN
1.0000 g | Freq: Once | INTRAVENOUS | Status: AC
  Administered 2024-04-19: 1 g via INTRAVENOUS
  Filled 2024-04-19: qty 100

## 2024-04-19 MED ORDER — SODIUM CHLORIDE 0.9 % IV BOLUS
1000.0000 mL | Freq: Once | INTRAVENOUS | Status: AC
  Administered 2024-04-19: 1000 mL via INTRAVENOUS

## 2024-04-19 MED ORDER — POTASSIUM CHLORIDE CRYS ER 20 MEQ PO TBCR
20.0000 meq | EXTENDED_RELEASE_TABLET | Freq: Once | ORAL | Status: AC
Start: 2024-04-19 — End: 2024-04-19
  Administered 2024-04-19: 20 meq via ORAL
  Filled 2024-04-19: qty 1

## 2024-04-19 MED ORDER — VITAMIN B-12 1000 MCG PO TABS
500.0000 ug | ORAL_TABLET | Freq: Two times a day (BID) | ORAL | Status: DC
  Administered 2024-04-19 – 2024-04-23 (×8): 500 ug via ORAL
  Filled 2024-04-19 (×8): qty 1

## 2024-04-19 NOTE — ED Triage Notes (Signed)
 Pt fell Tuesday. Called family Wednesday. Wounds over body. Can't put weight on right knee. CBG 95

## 2024-04-19 NOTE — ED Notes (Signed)
 Pt wounds irrigated and wrapped with non-adherent dressing.

## 2024-04-19 NOTE — H&P (Signed)
 History and Physical    Patient: [name] FMW:[id number] DOB: [birth year] DOA: 04/19/2024 DOS: the patient was seen and examined on 04/19/2024 PCP: Theophilus Andrews, Tully GRADE, MD  Patient coming from: Home  Chief Complaint: No chief complaint on file.  HPI: [name] is a 88 y.o. male with medical history significant of aortic stenosis, status post aortic valve replacement, atrial fibrillation, chronic diastolic heart failure, mitral valve prolapse, carotid bruit, hypertension, centrilobular emphysema, history of GI bleed with acute blood loss anemia, gout, emphysema, hypersensitivity pneumonitis, non-small cell lung cancer, pulmonary nodules, nontraumatic epidural hematoma, spinal stenosis, vitamin B12 deficiency, vitamin D  deficiency who was brought to the emergency department after having a fall at home 4 days ago. He stated that he lost consciousness for maybe 15 minutes. Two days after the fall he was unable to walk due to swelling and tenderness of his right knee. He had another fall in recent weeks while carrying groceries inside the house. He did not sustain any major injuries. He denied fever, chills, rhinorrhea, sore throat, wheezing or hemoptysis.  No chest pain, palpitations, diaphoresis, PND, orthopnea or pitting edema of the lower extremities.  No abdominal pain, nausea, emesis, diarrhea, constipation, melena or hematochezia.  No flank pain, dysuria, frequency or hematuria.  No polyuria, polydipsia, polyphagia or blurred vision.   Lab work: CBC showed a white count of 10.0, hemoglobin 11.2 g/dL platelets 842.  PT was 15.9 and INR 1.2.  Total CK was normal at 241 units/L.  CMP showed a sodium 133, potassium 3.9, chloride of 95 and CO2 of 24 mmol/L.  Total bilirubin 3.0, glucose 80, BUN 19 and creatinine 0.76 mg/dL.  Calcium normalizes after correction.  Total protein 6.4 and albumin  2.9 g/dL, normal alkaline phosphatase and transaminases.  Imaging: Right knead x-ray showing  suprapatellar soft tissue swelling Docuprene joint effusion or hematoma.  No acute fracture.  Severe DJD.  Right elbow x-ray with severe DJD.  Bulky heterotopic calcification were ossification, but no acute fracture or dislocation.  Calcified peripheral vascular disease.  Left elbow x-ray with possible soft tissue hematoma about the left elbow and the forearm.  Severe left elbow joint degeneration.  No acute fracture or dislocation.  Calcified peripheral vascular disease.  CT head without contrast with no acute intracranial abnormality or recent traumatic injury identified.  Stable noncontrast CT appearance of the chronic small vessel disease.  Calcified scalp vessel atherosclerosis which can be seen with end-stage renal disease.  ED course: Initial vital signs were temperature 98.5 F, pulse 99, respiration 18, BP 153/74 mmHg O2 sat and 96% on room air.  He received 1000 mL of normal saline bolus in the emergency department.   Review of Systems: As mentioned in the history of present illness. All other systems reviewed and are negative. Past Medical History:  Diagnosis Date   Aortic stenosis    Atrial fibrillation (HCC)    Baker cyst    Left knee   DJD (degenerative joint disease)    knees   Dysrhythmia    Eczema    Erectile dysfunction    Gout    History of hiatal hernia    History of radiation therapy    Right lung- 04/21/21-05/03/21 Dr. Lynwood Nasuti   HTN (hypertension)    MVP (mitral valve prolapse)    Shortness of breath dyspnea    with activity   Past Surgical History:  Procedure Laterality Date    polyps vocal cord  1994   AORTIC VALVE REPLACEMENT  ~  2004   BRONCHIAL BIOPSY  03/07/2021   Procedure: BRONCHIAL BIOPSIES;  Surgeon: Shelah Lamar RAMAN, MD;  Location: [facility] ENDOSCOPY;  Service: Pulmonary;;   BRONCHIAL BIOPSY  11/14/2021   Procedure: BRONCHIAL BIOPSIES;  Surgeon: Shelah Lamar RAMAN, MD;  Location: [facility] ENDOSCOPY;  Service: Pulmonary;;   BRONCHIAL BRUSHINGS  03/07/2021    Procedure: BRONCHIAL BRUSHINGS;  Surgeon: Shelah Lamar RAMAN, MD;  Location: [facility] ENDOSCOPY;  Service: Pulmonary;;   BRONCHIAL BRUSHINGS  11/14/2021   Procedure: BRONCHIAL BRUSHINGS;  Surgeon: Shelah Lamar RAMAN, MD;  Location: [facility] ENDOSCOPY;  Service: Pulmonary;;   BRONCHIAL NEEDLE ASPIRATION BIOPSY  03/07/2021   Procedure: BRONCHIAL NEEDLE ASPIRATION BIOPSIES;  Surgeon: Shelah Lamar RAMAN, MD;  Location: [facility] ENDOSCOPY;  Service: Pulmonary;;   BRONCHIAL NEEDLE ASPIRATION BIOPSY  11/14/2021   Procedure: BRONCHIAL NEEDLE ASPIRATION BIOPSIES;  Surgeon: Shelah Lamar RAMAN, MD;  Location: [facility] ENDOSCOPY;  Service: Pulmonary;;   BRONCHIAL WASHINGS  09/21/2020   Procedure: BRONCHIAL WASHINGS;  Surgeon: Kara Dorn NOVAK, MD;  Location: [facility] ENDOSCOPY;  Service: Pulmonary;;  BAL    BRONCHIAL WASHINGS  03/07/2021   Procedure: BRONCHIAL WASHINGS;  Surgeon: Shelah Lamar RAMAN, MD;  Location: [facility] ENDOSCOPY;  Service: Pulmonary;;   BRONCHIAL WASHINGS  11/14/2021   Procedure: BRONCHIAL WASHINGS;  Surgeon: Shelah Lamar RAMAN, MD;  Location: [facility] ENDOSCOPY;  Service: Pulmonary;;   FIDUCIAL MARKER PLACEMENT  03/07/2021   Procedure: FIDUCIAL MARKER PLACEMENT;  Surgeon: Shelah Lamar RAMAN, MD;  Location: [facility] ENDOSCOPY;  Service: Pulmonary;;   HERNIA REPAIR  [phone]   HERNIA REPAIR  ~1983   IR ANGIOGRAM PELVIS SELECTIVE OR SUPRASELECTIVE  12/01/2022   IR ANGIOGRAM PELVIS SELECTIVE OR SUPRASELECTIVE  12/01/2022   IR ANGIOGRAM SELECTIVE EACH ADDITIONAL VESSEL  12/01/2022   IR ANGIOGRAM SELECTIVE EACH ADDITIONAL VESSEL  12/01/2022   IR CT SPINE LTD  12/01/2022   IR CT SPINE LTD  12/01/2022   IR CT SPINE LTD  12/01/2022   IR EMBO ARTERIAL NOT HEMORR HEMANG INC GUIDE ROADMAPPING  12/01/2022   IR EMBO TUMOR ORGAN ISCHEMIA INFARCT INC GUIDE ROADMAPPING  12/01/2022   IR RADIOLOGIST EVAL & MGMT  11/17/2022   IR US  GUIDE VASC ACCESS LEFT  12/01/2022   IR US  GUIDE VASC ACCESS RIGHT  12/01/2022   LUMBAR LAMINECTOMY/DECOMPRESSION MICRODISCECTOMY N/A 01/15/2015   Procedure: 2 LEVEL  DECOMPRESSIVE LUMBAR LAMINECTOMY L3-L4,L4-L5;  Surgeon: Tanda Heading, MD;  Location: [facility] ORS;  Service: Orthopedics;  Laterality: N/A;   LUMBAR LAMINECTOMY/DECOMPRESSION MICRODISCECTOMY N/A 05/01/2017   Procedure: Lumbar one-Lumbar two, Lumbar two-Lumbar three, Lumbar three-Lumbar four Laminectomy; Evacuation of hematoma;  Surgeon: Lanis Pupa, MD;  Location: [facility] OR;  Service: Neurosurgery;  Laterality: N/A;   right total hip  ~4 years ago   ROTATOR CUFF REPAIR Left ~2009   TOTAL KNEE ARTHROPLASTY Left 04/14/2015   Procedure: LEFT TOTAL KNEE ARTHROPLASTY;  Surgeon: Tanda Heading, MD;  Location: [facility] ORS;  Service: Orthopedics;  Laterality: Left;   VIDEO BRONCHOSCOPY N/A 09/21/2020   Procedure: VIDEO BRONCHOSCOPY WITHOUT FLUORO;  Surgeon: Kara Dorn NOVAK, MD;  Location: [facility] ENDOSCOPY;  Service: Pulmonary;  Laterality: N/A;   VIDEO BRONCHOSCOPY WITH ENDOBRONCHIAL NAVIGATION Right 03/07/2021   Procedure: VIDEO BRONCHOSCOPY WITH ENDOBRONCHIAL NAVIGATION;  Surgeon: Shelah Lamar RAMAN, MD;  Location: [facility] ENDOSCOPY;  Service: Pulmonary;  Laterality: Right;   VIDEO BRONCHOSCOPY WITH RADIAL ENDOBRONCHIAL ULTRASOUND  11/14/2021   Procedure: VIDEO BRONCHOSCOPY WITH RADIAL ENDOBRONCHIAL ULTRASOUND;  Surgeon: Shelah Lamar RAMAN, MD;  Location: [facility] ENDOSCOPY;  Service: Pulmonary;;   Social History:  reports that he quit smoking about 21 years ago. His smoking use included cigarettes. He started smoking about 51 years ago. He has never used smokeless tobacco. He reports current alcohol use of about 3.0 standard drinks of alcohol per week. He reports that he does not use drugs.  No Known Allergies  Family History  Problem Relation Age of Onset   Diabetes Other    Stroke Other     Prior to Admission medications   Medication Sig Start Date End Date Taking? Authorizing Provider  allopurinol  (ZYLOPRIM ) 300 MG tablet Take 1 tablet by mouth once daily 11/07/23   Theophilus Andrews, Estela Y, MD  amLODipine  (NORVASC ) 5  MG tablet Take 1 tablet by mouth once daily 11/07/23   Hernandez Acosta, Estela Y, MD  cephALEXin (KEFLEX) 500 MG capsule Take 500 mg by mouth 4 (four) times daily. 01/25/24   [provider]  cyanocobalamin  (VITAMIN B12) 500 MCG tablet Take 1 tablet (500 mcg total) by mouth daily. Patient taking differently: Take 500 mcg by mouth daily. BID 09/22/23   Regalado, Belkys A, MD  furosemide  (LASIX ) 20 MG tablet Take 3 tablets (60 mg total) by mouth daily. Patient taking differently: Take 60 mg by mouth in the morning. 06/28/23   Pietro Redell RAMAN, MD  pantoprazole  (PROTONIX ) 40 MG tablet Take 1 tablet (40 mg total) by mouth 2 (two) times daily. 09/21/23 11/20/23  Regalado, Belkys A, MD  potassium chloride  SA (KLOR-CON  M) 20 MEQ tablet Take 1 tablet (20 mEq total) by mouth daily. Patient taking differently: Take 20 mEq by mouth in the morning. 07/11/23   Pietro Redell RAMAN, MD  predniSONE  (DELTASONE ) 20 MG tablet Take 1 tablet by mouth once daily with breakfast 01/10/24   Theophilus Andrews, Tully GRADE, MD  tamsulosin  (FLOMAX ) 0.4 MG CAPS capsule TAKE 1 CAPSULE BY MOUTH ONCE DAILY AFTER BREAKFAST Patient taking differently: Take 0.4 mg by mouth daily after breakfast. 12/25/22   Theophilus Andrews, Tully GRADE, MD  traMADol  (ULTRAM ) 50 MG tablet TAKE 1 TABLET BY MOUTH EVERY 12 HOURS AS NEEDED 03/12/24   Theophilus Andrews, Tully GRADE, MD  traZODone  (DESYREL ) 50 MG tablet TAKE 1 TABLET BY MOUTH AT BEDTIME AS NEEDED 03/27/24   Theophilus Andrews, Tully GRADE, MD  triamcinolone  cream (KENALOG ) 0.1 % APPLY CREAM EXTERNALLY TO AFFECTED AREA TWICE DAILY 11/07/23   Theophilus Andrews, Tully GRADE, MD  VITAMIN D  PO Take 1 tablet by mouth in the morning.    [provider]  warfarin (COUMADIN ) 5 MG tablet TAKE 1/2 (ONE-HALF) A TABLET BY MOUTH ONCE DAILY EXCEPT  TAKE 1 TABLET ON WEDNESDAY AS DIRECTED  BY  ANTICOAGULATION  CLINIC 03/27/24   Theophilus Andrews, Tully GRADE, MD    Physical Exam: Vitals:   04/19/24 0945 04/19/24 0948   BP:  (!) 153/74  Pulse:  99  Resp:  18  Temp:  98.5 F (36.9 C)  TempSrc:  Oral  SpO2:  96%  Weight: 72.7 kg   Height: 5' 3 (1.6 m)    Physical Exam Vitals reviewed.  Constitutional:      General: He is awake. He is not in acute distress.    Appearance: He is ill-appearing.  HENT:     Head: Normocephalic.     Nose: No rhinorrhea.     Mouth/Throat:     Mouth: Mucous membranes are moist.  Eyes:     General: No scleral icterus.    Pupils: Pupils are equal, round, and reactive to light.  Cardiovascular:  Rate and Rhythm: Normal rate and regular rhythm.  Pulmonary:     Effort: Pulmonary effort is normal.     Breath sounds: Normal breath sounds. No wheezing, rhonchi or rales.  Abdominal:     General: Bowel sounds are normal. There is no distension.     Palpations: Abdomen is soft.     Tenderness: There is no abdominal tenderness. There is no right CVA tenderness or left CVA tenderness.     Hernia: A hernia is present. Hernia is present in the umbilical area.  Musculoskeletal:     Cervical back: Neck supple.     Right lower leg: No edema.     Left lower leg: No edema.  Skin:    General: Skin is warm and dry.     Findings: Bruising present.  Neurological:     General: No focal deficit present.     Mental Status: He is alert and oriented to person, place, and time.  Psychiatric:        Mood and Affect: Mood normal.        Behavior: Behavior normal. Behavior is cooperative.     Data Reviewed:  Results are pending, will review when available. 01/19/2023 echocardiogram report. IMPRESSIONS:   1. S/P AVR with mean gradient 16 mmHg and mild AI.   2. Left ventricular ejection fraction, by estimation, is 70 to 75%. The  left ventricle has hyperdynamic function. The left ventricle has no  regional wall motion abnormalities. There is severe left ventricular  hypertrophy. Left ventricular diastolic  parameters are indeterminate.   3. Right ventricular systolic function  is normal. The right ventricular  size is normal. There is mildly elevated pulmonary artery systolic  pressure.   4. Left atrial size was severely dilated.   5. Right atrial size was severely dilated.   6. The mitral valve is normal in structure. Mild mitral valve  regurgitation. No evidence of mitral stenosis.   7. The aortic valve has been repaired/replaced. Aortic valve  regurgitation is mild. No aortic stenosis is present. There is a  bioprosthetic valve present in the aortic position. Procedure Date:  02/2005.   8. The inferior vena cava is normal in size with greater than 50%  respiratory variability, suggesting right atrial pressure of 3 mmHg.   Assessment and Plan: Principal Problem:   Fall at home In the setting of:   Physical deconditioning  Resulting in:   Abrasion of both knees And significant   Contusion of right knee  Observation/telemetry. Analgesics as needed. Antiemetics as needed. Consult physical therapy. Consult Occupational Therapy. Consult TOC team.  Active Problems:   Syncope  Unclear etiology. Fall precautions. Check carotid Doppler. Check echocardiogram.    Chronic diastolic CHF (congestive heart failure) (HCC) Does not seem decompensated. Will check echocardiogram.    Permanent atrial fibrillation (HCC)   Frequent PVCs CHA?DS?-VASc Score of at least 5. Holding dihydropyridine to avoid reflex tachycardia. Metoprolol  tartrate 5 mg IVP x 1 dose given. Magnesium  sulfate 1 g IVPB. KCl 10 mill equivalents p.o. x 1. Warfarin per pharmacy.    Essential hypertension Mildly tachycardic. Will hold amlodipine  today. Metoprolol  5 mg IVP given.    Normocytic anemia Monitor hematocrit and hemoglobin.    Hyperbilirubinemia Alk phos and transaminases are normal. Might be related to his ecchymosis development. Will check LFTs tomorrow morning.    Vitamin B12 deficiency Continue B12 supplementation.    Centrilobular emphysema  (HCC) Bronchodilators as needed. Would prefer to use Xopenex.    Gout  Continue daily allopurinol .   Advance Care Planning:   Code Status: Full Code   Consults:   Family Communication: I spoke to his daughter [name] via telephone  Severity of Illness: The appropriate patient status for this patient is INPATIENT. Inpatient status is judged to be reasonable and necessary in order to provide the required intensity of service to ensure the patient's safety. The patient's presenting symptoms, physical exam findings, and initial radiographic and laboratory data in the context of their chronic comorbidities is felt to place them at high risk for further clinical deterioration. Furthermore, it is not anticipated that the patient will be medically stable for discharge from the hospital within 2 midnights of admission.   * I certify that at the point of admission it is my clinical judgment that the patient will require inpatient hospital care spanning beyond 2 midnights from the point of admission due to high intensity of service, high risk for further deterioration and high frequency of surveillance required.*  Author: Alm Dorn Castor, MD 04/19/2024 11:55 AM  For on call review [email or web address].   This document was prepared using Dragon voice recognition software and may contain some unintended transcription errors.

## 2024-04-19 NOTE — Plan of Care (Signed)
  Problem: Education: Goal: Knowledge of General Education information will improve Description: Including pain rating scale, medication(s)/side effects and non-pharmacologic comfort measures Outcome: Progressing   Problem: Health Behavior/Discharge Planning: Goal: Ability to manage health-related needs will improve Outcome: Progressing   Problem: Activity: Goal: Risk for activity intolerance will decrease Outcome: Progressing   Problem: Nutrition: Goal: Adequate nutrition will be maintained Outcome: Progressing   Problem: Elimination: Goal: Will not experience complications related to bowel motility Outcome: Progressing   Problem: Pain Managment: Goal: General experience of comfort will improve and/or be controlled Outcome: Progressing   Problem: Safety: Goal: Ability to remain free from injury will improve Outcome: Progressing   Problem: Education: Goal: Knowledge of condition and prescribed therapy will improve Outcome: Progressing   Problem: Physical Regulation: Goal: Complications related to the disease process, condition or treatment will be avoided or minimized Outcome: Progressing

## 2024-04-19 NOTE — ED Provider Notes (Signed)
 [place] EMERGENCY DEPARTMENT AT [facility] Provider Note   CSN: [id number] Arrival date & time: 04/19/24  9062     Patient presents with: No chief complaint on file.   [name] is a 88 y.o. male.   88 yo M with a chief complaints of a fall.  This occurred on Tuesday.  He has been unable to bear weight on his right knee since.  Had been doing relatively okay at home with assistance to get up and to feed him.  He unfortunately has become progressively weak and was unable to get up at all today with assistance and so family brought him in to be evaluated.   Wounds were dressed on Tuesday but never changed.  He has a chronic wound to the left lower extremity that gets changed at the wound care center.  Family feels that the wound has been looking good.  No fevers but they are concerned that maybe the wounds have become infected already.  Patient denies any significant injury from the fall other than his right knee.  Denies chest pain back pain abdominal pain.  He has been drinking okay but has not really felt like eating.        Prior to Admission medications   Medication Sig Start Date End Date Taking? Authorizing Provider  allopurinol  (ZYLOPRIM ) 300 MG tablet Take 1 tablet by mouth once daily 11/07/23   Theophilus Andrews, Estela Y, MD  amLODipine  (NORVASC ) 5 MG tablet Take 1 tablet by mouth once daily 11/07/23   Hernandez Acosta, Estela Y, MD  cephALEXin (KEFLEX) 500 MG capsule Take 500 mg by mouth 4 (four) times daily. 01/25/24   [provider]  cyanocobalamin  (VITAMIN B12) 500 MCG tablet Take 1 tablet (500 mcg total) by mouth daily. Patient taking differently: Take 500 mcg by mouth daily. BID 09/22/23   Regalado, Belkys A, MD  furosemide  (LASIX ) 20 MG tablet Take 3 tablets (60 mg total) by mouth daily. Patient taking differently: Take 60 mg by mouth in the morning. 06/28/23   Pietro Redell RAMAN, MD  pantoprazole  (PROTONIX ) 40 MG tablet Take 1 tablet (40  mg total) by mouth 2 (two) times daily. 09/21/23 11/20/23  Regalado, Belkys A, MD  potassium chloride  SA (KLOR-CON  M) 20 MEQ tablet Take 1 tablet (20 mEq total) by mouth daily. Patient taking differently: Take 20 mEq by mouth in the morning. 07/11/23   Pietro Redell RAMAN, MD  predniSONE  (DELTASONE ) 20 MG tablet Take 1 tablet by mouth once daily with breakfast 01/10/24   Theophilus Andrews, Tully GRADE, MD  tamsulosin  (FLOMAX ) 0.4 MG CAPS capsule TAKE 1 CAPSULE BY MOUTH ONCE DAILY AFTER BREAKFAST Patient taking differently: Take 0.4 mg by mouth daily after breakfast. 12/25/22   Theophilus Andrews, Tully GRADE, MD  traMADol  (ULTRAM ) 50 MG tablet TAKE 1 TABLET BY MOUTH EVERY 12 HOURS AS NEEDED 03/12/24   Theophilus Andrews, Tully GRADE, MD  traZODone  (DESYREL ) 50 MG tablet TAKE 1 TABLET BY MOUTH AT BEDTIME AS NEEDED 03/27/24   Theophilus Andrews, Tully GRADE, MD  triamcinolone  cream (KENALOG ) 0.1 % APPLY CREAM EXTERNALLY TO AFFECTED AREA TWICE DAILY 11/07/23   Theophilus Andrews, Tully GRADE, MD  VITAMIN D  PO Take 1 tablet by mouth in the morning.    [provider]  warfarin (COUMADIN ) 5 MG tablet TAKE 1/2 (ONE-HALF) A TABLET BY MOUTH ONCE DAILY EXCEPT  TAKE 1 TABLET ON WEDNESDAY AS DIRECTED  BY  ANTICOAGULATION  CLINIC 03/27/24   Theophilus Andrews, Hector  Y, MD    Allergies: Patient has no known allergies.    Review of Systems  Updated Vital Signs BP (!) 153/74   Pulse 99   Temp 98.5 F (36.9 C) (Oral)   Resp 18   Ht 5' 3 (1.6 m)   Wt 72.7 kg   SpO2 96%   BMI 28.39 kg/m   Physical Exam Vitals and nursing note reviewed.  Constitutional:      Appearance: He is well-developed.  HENT:     Head: Normocephalic and atraumatic.  Eyes:     Pupils: Pupils are equal, round, and reactive to light.  Neck:     Vascular: No JVD.  Cardiovascular:     Rate and Rhythm: Normal rate and regular rhythm.     Heart sounds: No murmur heard.    No friction rub. No gallop.  Pulmonary:     Effort: No respiratory distress.      Breath sounds: No wheezing.  Abdominal:     General: There is no distension.     Tenderness: There is no abdominal tenderness. There is no guarding or rebound.  Musculoskeletal:        General: Normal range of motion.     Cervical back: Normal range of motion and neck supple.     Comments: Scattered skin tears about the body mostly on the arms and legs.  The worst is on the radial aspect of the left upper extremity.  Complete avulsion of the skin.  Some oozing of blood.  There are some areas of yellowish drainage so I think more likely to be granulation then infection.  Patient has a wound to the left lower extremity that they would prefer to keep wrapped.  Looks better per family.  Skin:    Coloration: Skin is not pale.     Findings: No rash.  Neurological:     Mental Status: He is alert and oriented to person, place, and time.  Psychiatric:        Behavior: Behavior normal.     (all labs ordered are listed, but only abnormal results are displayed) Labs Reviewed  CBC WITH DIFFERENTIAL/PLATELET - Abnormal; Notable for the following components:      Result Value   RBC 3.62 (*)    Hemoglobin 11.2 (*)    HCT 34.8 (*)    Neutro Abs 8.3 (*)    Lymphs Abs 0.5 (*)    Monocytes Absolute 1.1 (*)    All other components within normal limits  COMPREHENSIVE METABOLIC PANEL WITH GFR - Abnormal; Notable for the following components:   Sodium 133 (*)    Chloride 95 (*)    Calcium 8.6 (*)    Total Protein 6.4 (*)    Albumin  2.9 (*)    Total Bilirubin 3.0 (*)    All other components within normal limits  PROTIME-INR - Abnormal; Notable for the following components:   Prothrombin Time 15.9 (*)    All other components within normal limits  CK  URINALYSIS, ROUTINE W REFLEX MICROSCOPIC    EKG: None  Radiology: DG Elbow Complete Left Result Date: 04/19/2024 CLINICAL DATA:  88 year old male status post fall earlier this week. Unable to weightbear. EXAM: LEFT ELBOW - COMPLETE 3+ VIEW  COMPARISON:  None Available. FINDINGS: Four views. Severe left elbow joint degeneration. Joint space loss, subchondral sclerosis, osteophytosis. Bulky heterotopic calcification or ossification of the adjacent soft tissues. No definite joint effusion. No acute fracture or dislocation identified. Abnormal and asymmetric soft tissue contours  about the left elbow and visible forearm (image #1). No soft tissue gas identified. Calcified peripheral vascular disease. IMPRESSION: 1. Possible soft tissue hematoma about the left elbow and in the forearm. 2. Severe left elbow joint degeneration. No acute fracture or dislocation identified. 3. Calcified peripheral vascular disease. Electronically Signed   By: VEAR Hurst M.D.   On: 04/19/2024 11:17   DG Elbow Complete Right Result Date: 04/19/2024 CLINICAL DATA:  88 year old male status post fall earlier this week. Unable to weightbear. EXAM: RIGHT ELBOW - COMPLETE 3+ VIEW COMPARISON:  None Available. FINDINGS: Four views. Severe degeneration throughout the right elbow. Joint space loss, subchondral sclerosis, osteophytosis, and bulky heterotopic calcification or ossification. This includes a large 2 cm antecubital fossa calcification. No obvious joint effusion. No superimposed acute fracture or dislocation identified. Calcified peripheral vascular disease. IMPRESSION: 1. Severe right elbow degeneration. Bulky heterotopic calcification or ossification. No acute fracture or dislocation identified. 2. Calcified peripheral vascular disease. Electronically Signed   By: VEAR Hurst M.D.   On: 04/19/2024 11:15   DG Knee Complete 4 Views Right Result Date: 04/19/2024 CLINICAL DATA:  89 year old male status post fall earlier this week. Unable to weightbear. EXAM: RIGHT KNEE - COMPLETE 4+ VIEW COMPARISON:  Knee series 03/15/2022. FINDINGS: Four views including cross-table lateral. Chronic severe tricompartmental degenerative spurring, severe chronic medial right knee joint space loss.  Medial and lateral compartment dystrophic calcification. Suprapatellar soft tissue swelling which could be joint effusion or hematoma. Patella appears intact. No acute osseous abnormality identified. Calcified peripheral vascular disease. IMPRESSION: 1. Suprapatellar soft tissue swelling could be joint effusion or hematoma. 2. Chronic severe right knee joint degeneration. No acute fracture or dislocation identified. Electronically Signed   By: VEAR Hurst M.D.   On: 04/19/2024 11:13   CT Head Wo Contrast Result Date: 04/19/2024 CLINICAL DATA:  88 year old male status post fall earlier this week. Unable to weightbear. EXAM: CT HEAD WITHOUT CONTRAST TECHNIQUE: Contiguous axial images were obtained from the base of the skull through the vertex without intravenous contrast. RADIATION DOSE REDUCTION: This exam was performed according to the departmental dose-optimization program which includes automated exposure control, adjustment of the mA and/or kV according to patient size and/or use of iterative reconstruction technique. COMPARISON:  Head CT 01/13/2024. FINDINGS: Brain: Stable cerebral volume. No midline shift, ventriculomegaly, mass effect, evidence of mass lesion, intracranial hemorrhage or evidence of cortically based acute infarction. Chronic dural calcification incidentally noted. Patchy and confluent moderate bilateral cerebral white matter hypodensity. Chronic deep white matter capsule involvement. Stable gray-white matter differentiation throughout the brain. Vascular: Calcified atherosclerosis at the skull base. No suspicious intracranial vascular hyperdensity. Skull: Appears stable and intact. No acute osseous abnormality identified. Sinuses/Orbits: Visualized paranasal sinuses and mastoids are clear. Other: Calcified scalp vessel atherosclerosis. Stable orbit and scalp soft tissues. IMPRESSION: 1. No acute intracranial abnormality or recent traumatic injury identified. 2. Stable non contrast CT appearance  of chronic small vessel disease. 3. Calcified scalp vessel atherosclerosis which can be seen with end stage renal disease. Electronically Signed   By: VEAR Hurst M.D.   On: 04/19/2024 11:12     Procedures   Medications Ordered in the ED  sodium chloride  0.9 % bolus 1,000 mL (1,000 mLs Intravenous New Bag/Given 04/19/24 1157)                                    Medical Decision Making Amount and/or Complexity of  Data Reviewed Labs: ordered. Radiology: ordered.   88 yo M with a chief complaint of a fall.  This occurred 5 days ago.  Since then has not really been able to get up and walk around.  Has been able to with some assistance but has gotten to the point where he has trouble sitting or even coming to a standing position.  Patient looks quite dehydrated on exam clinically.  No significant change to renal function but total bilirubin is elevated and sodium and chloride are low.  Plain film of the right knee independently interpreted by me without obvious fracture.  Will obtain CT imaging to assess for occult tibial plateau.  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  Discussed results with hospitalist.  Will evaluate for observation.  The patients results and plan were reviewed and discussed.   Any x-rays performed were independently reviewed by myself.   Differential diagnosis were considered with the presenting HPI.  Medications  sodium chloride  0.9 % bolus 1,000 mL (1,000 mLs Intravenous New Bag/Given 04/19/24 1157)    Vitals:   04/19/24 0945 04/19/24 0948  BP:  (!) 153/74  Pulse:  99  Resp:  18  Temp:  98.5 F (36.9 C)  TempSrc:  Oral  SpO2:  96%  Weight: 72.7 kg   Height: 5' 3 (1.6 m)     Final diagnoses:  Fall, initial encounter  Dehydration  Failure to thrive in adult    Admission/ observation were discussed with the admitting physician, patient and/or family and they are comfortable  with the plan.         Final diagnoses:  Fall, initial encounter  Dehydration  Failure to thrive in adult    ED Discharge Orders     None          Emil Share, DO 04/19/24 1159

## 2024-04-19 NOTE — ED Notes (Signed)
 Daughter [name] called and updated about pt's plan of care. Dr. Celinda made aware via secure chat that daughter would also like to speak to him as well.

## 2024-04-19 NOTE — Progress Notes (Addendum)
 PHARMACY - ANTICOAGULATION CONSULT NOTE  Pharmacy Consult for resuming warfarin  Indication: atrial fibrillation  No Known Allergies  Patient Measurements: Height: 5' 3 (160 cm) Weight: 72.7 kg (160 lb 4.4 oz) IBW/kg (Calculated) : 56.9 HEPARIN  DW (KG): 71.6  Vital Signs: Temp: 98.2 F (36.8 C) (08/23 1721) Temp Source: Oral (08/23 1721) BP: 141/111 (08/23 1721) Pulse Rate: 114 (08/23 1721)  Labs: Recent Labs    04/19/24 1050  HGB 11.2*  HCT 34.8*  PLT 157  LABPROT 15.9*  INR 1.2  CREATININE 0.76  CKTOTAL 241    Estimated Creatinine Clearance: 59.3 mL/min (by C-G formula based on SCr of 0.76 mg/dL).   Medical History: Past Medical History:  Diagnosis Date   Aortic stenosis    Atrial fibrillation (HCC)    Baker cyst    Left knee   DJD (degenerative joint disease)    knees   Dysrhythmia    Eczema    Erectile dysfunction    Gout    History of hiatal hernia    History of radiation therapy    Right lung- 04/21/21-05/03/21 Dr. Lynwood Nasuti   HTN (hypertension)    MVP (mitral valve prolapse)    Shortness of breath dyspnea    with activity    Medications:  Medications Prior to Admission  Medication Sig Dispense Refill Last Dose/Taking   allopurinol  (ZYLOPRIM ) 300 MG tablet Take 1 tablet by mouth once daily 90 tablet 1 04/17/2024   amLODipine  (NORVASC ) 5 MG tablet Take 1 tablet by mouth once daily 90 tablet 1 04/17/2024   cyanocobalamin  (VITAMIN B12) 500 MCG tablet Take 1 tablet (500 mcg total) by mouth daily. (Patient taking differently: Take 500 mcg by mouth daily. BID) 30 tablet 0 04/17/2024   furosemide  (LASIX ) 20 MG tablet Take 3 tablets (60 mg total) by mouth daily. (Patient taking differently: Take 60 mg by mouth in the morning.) 270 tablet 3 04/17/2024   potassium chloride  SA (KLOR-CON  M) 20 MEQ tablet Take 1 tablet (20 mEq total) by mouth daily. (Patient taking differently: Take 20 mEq by mouth in the morning.) 90 tablet 3 04/17/2024   predniSONE   (DELTASONE ) 20 MG tablet Take 1 tablet by mouth once daily with breakfast 90 tablet 0 04/17/2024   traMADol  (ULTRAM ) 50 MG tablet TAKE 1 TABLET BY MOUTH EVERY 12 HOURS AS NEEDED 60 tablet 0 04/17/2024   traZODone  (DESYREL ) 50 MG tablet TAKE 1 TABLET BY MOUTH AT BEDTIME AS NEEDED (Patient taking differently: Take 50 mg by mouth at bedtime as needed for sleep.) 90 tablet 0 04/17/2024   warfarin (COUMADIN ) 5 MG tablet TAKE 1/2 (ONE-HALF) A TABLET BY MOUTH ONCE DAILY EXCEPT  TAKE 1 TABLET ON WEDNESDAY AS DIRECTED  BY  ANTICOAGULATION  CLINIC (Patient taking differently: Take 2.5 mg by mouth daily. TAKE 1/2 (ONE-HALF) A TABLET BY MOUTH ONCE DAILY EXCEPT  TAKE 1 TABLET ON WEDNESDAY AS DIRECTED  BY  ANTICOAGULATION  CLINIC) 75 tablet 0 04/17/2024   tamsulosin  (FLOMAX ) 0.4 MG CAPS capsule TAKE 1 CAPSULE BY MOUTH ONCE DAILY AFTER BREAKFAST (Patient not taking: Reported on 04/19/2024) 90 capsule 0 Not Taking   Scheduled:   allopurinol   300 mg Oral Daily   cyanocobalamin   500 mcg Oral Daily   feeding supplement  237 mL Oral BID BM   [START ON 04/20/2024] furosemide   60 mg Oral q AM   [START ON 04/20/2024] potassium chloride  SA  20 mEq Oral q AM   sodium chloride  flush  3 mL Intravenous Q12H  Assessment: 48 yoM presented to the ED s/p fall with LOC a few days prior to presentation. Patient has a chronic wound on LLE and has reported poor po intake and was dehydrated on physical exam. Pertinent PMH of GIB (08/2023), non-small cell lung cancer, nontraumatic epidural hematoma atrial fibrillation on warfarin (previously switched from eliquis  10/02/23 due to affordability issues). Pharmacy consulted to continue warfarin while inpatient.   Goal of Therapy:  INR 2-2.5 (per anticoagulation summary on 04/15/2024) Monitor platelets by anticoagulation protocol: Yes   Today, 04/19/2024: PTA regimen: 2.5mg  daily  CBC low - Hgb 11.2, PLTc 157 INR 1.2 - subtherapeutic, expected as reported last dose was 8/21 Major drug  interactions: Allopurinol  may increase the anticoagulation effects of warfarin   Plan:  Warfarin 2.5 mg PO x1  Daily INR Monitor for signs of bleeding or thrombosis   Sherrel Shafer 04/19/2024,6:22 PM

## 2024-04-20 ENCOUNTER — Observation Stay (HOSPITAL_COMMUNITY)

## 2024-04-20 ENCOUNTER — Observation Stay (HOSPITAL_BASED_OUTPATIENT_CLINIC_OR_DEPARTMENT_OTHER)

## 2024-04-20 DIAGNOSIS — R55 Syncope and collapse: Secondary | ICD-10-CM | POA: Diagnosis not present

## 2024-04-20 DIAGNOSIS — S80211A Abrasion, right knee, initial encounter: Secondary | ICD-10-CM | POA: Diagnosis not present

## 2024-04-20 DIAGNOSIS — W19XXXA Unspecified fall, initial encounter: Secondary | ICD-10-CM | POA: Diagnosis not present

## 2024-04-20 DIAGNOSIS — Y92009 Unspecified place in unspecified non-institutional (private) residence as the place of occurrence of the external cause: Secondary | ICD-10-CM | POA: Diagnosis not present

## 2024-04-20 DIAGNOSIS — R509 Fever, unspecified: Secondary | ICD-10-CM | POA: Diagnosis not present

## 2024-04-20 DIAGNOSIS — S80212A Abrasion, left knee, initial encounter: Secondary | ICD-10-CM | POA: Diagnosis not present

## 2024-04-20 DIAGNOSIS — I517 Cardiomegaly: Secondary | ICD-10-CM | POA: Diagnosis not present

## 2024-04-20 DIAGNOSIS — R0989 Other specified symptoms and signs involving the circulatory and respiratory systems: Secondary | ICD-10-CM | POA: Diagnosis not present

## 2024-04-20 DIAGNOSIS — R918 Other nonspecific abnormal finding of lung field: Secondary | ICD-10-CM | POA: Diagnosis not present

## 2024-04-20 LAB — ECHOCARDIOGRAM COMPLETE
AR max vel: 1.25 cm2
AV Area VTI: 1.42 cm2
AV Area mean vel: 1.2 cm2
AV Mean grad: 16 mmHg
AV Peak grad: 29.7 mmHg
Ao pk vel: 2.73 m/s
Area-P 1/2: 3.5 cm2
Height: 63 in
MV VTI: 3.45 cm2
P 1/2 time: 787 ms
S' Lateral: 3.5 cm
Weight: 2663.16 [oz_av]

## 2024-04-20 LAB — COMPREHENSIVE METABOLIC PANEL WITH GFR
ALT: 15 U/L (ref 0–44)
AST: 23 U/L (ref 15–41)
Albumin: 2.5 g/dL — ABNORMAL LOW (ref 3.5–5.0)
Alkaline Phosphatase: 39 U/L (ref 38–126)
Anion gap: 10 (ref 5–15)
BUN: 23 mg/dL (ref 8–23)
CO2: 24 mmol/L (ref 22–32)
Calcium: 8.1 mg/dL — ABNORMAL LOW (ref 8.9–10.3)
Chloride: 98 mmol/L (ref 98–111)
Creatinine, Ser: 1.06 mg/dL (ref 0.61–1.24)
GFR, Estimated: >60 mL/min (ref >60)
Glucose, Bld: 101 mg/dL — ABNORMAL HIGH (ref 70–99)
Potassium: 3.6 mmol/L (ref 3.5–5.1)
Sodium: 132 mmol/L — ABNORMAL LOW (ref 135–145)
Total Bilirubin: 1.6 mg/dL — ABNORMAL HIGH (ref 0.0–1.2)
Total Protein: 5.4 g/dL — ABNORMAL LOW (ref 6.5–8.1)

## 2024-04-20 LAB — CBC
HCT: 31.2 % — ABNORMAL LOW (ref 39.0–52.0)
Hemoglobin: 9.8 g/dL — ABNORMAL LOW (ref 13.0–17.0)
MCH: 30.2 pg (ref 26.0–34.0)
MCHC: 31.4 g/dL (ref 30.0–36.0)
MCV: 96.3 fL (ref 80.0–100.0)
Platelets: 144 K/uL — ABNORMAL LOW (ref 150–400)
RBC: 3.24 MIL/uL — ABNORMAL LOW (ref 4.22–5.81)
RDW: 14.6 % (ref 11.5–15.5)
WBC: 8.2 K/uL (ref 4.0–10.5)
nRBC: 0 % (ref 0.0–0.2)

## 2024-04-20 LAB — GLUCOSE, CAPILLARY
Glucose-Capillary: 118 mg/dL — ABNORMAL HIGH (ref 70–99)
Glucose-Capillary: 98 mg/dL (ref 70–99)

## 2024-04-20 LAB — PROTIME-INR
INR: 1.2 (ref 0.8–1.2)
Prothrombin Time: 16.3 s — ABNORMAL HIGH (ref 11.4–15.2)

## 2024-04-20 MED ORDER — MUPIROCIN 2 % EX OINT
TOPICAL_OINTMENT | Freq: Two times a day (BID) | CUTANEOUS | Status: DC
  Filled 2024-04-20 (×2): qty 22

## 2024-04-20 MED ORDER — PREDNISONE 20 MG PO TABS
20.0000 mg | ORAL_TABLET | Freq: Every day | ORAL | Status: DC
  Administered 2024-04-20 – 2024-04-23 (×4): 20 mg via ORAL
  Filled 2024-04-20 (×4): qty 1

## 2024-04-20 MED ORDER — WARFARIN SODIUM 2 MG PO TABS: 2.0000 mg | ORAL_TABLET | Freq: Once | ORAL | Status: DC

## 2024-04-20 MED ORDER — BENZONATATE 100 MG PO CAPS: 100.0000 mg | ORAL_CAPSULE | Freq: Three times a day (TID) | ORAL | Status: DC | PRN

## 2024-04-20 MED ORDER — IPRATROPIUM-ALBUTEROL 0.5-2.5 (3) MG/3ML IN SOLN: 3.0000 mL | RESPIRATORY_TRACT | Status: DC | PRN

## 2024-04-20 MED ORDER — WARFARIN SODIUM 2.5 MG PO TABS
2.5000 mg | ORAL_TABLET | Freq: Once | ORAL | Status: AC
  Administered 2024-04-20: 2.5 mg via ORAL
  Filled 2024-04-20: qty 1

## 2024-04-20 NOTE — Progress Notes (Addendum)
 PROGRESS NOTE  CORDE ANTONINI FMW:[id number] DOB: 08-08-1936 DOA: 04/19/2024 PCP: Theophilus Andrews, Tully GRADE, MD   LOS: 0 days   Brief narrative:  [name] is a 88 y.o. male with past medical history significant of aortic stenosis, status post aortic valve replacement, paroxysmal atrial fibrillation, chronic diastolic heart failure, mitral valve prolapse, hypertension, centrilobular emphysema, gout,  non-small cell lung cancer,  spinal stenosis, vitamin B12 deficiency, vitamin D  deficiency w presented to the hospital after sustaining a fall 4 days prior to presentation.  Patient complains of pain and swelling and redness in the knee after the fall.  In the ED vitals were stable.  Labs were notable for normal CBC.  CK level of 241.  CMP showed mild hyponatremia with sodium of 133.  Imaging showed some prepatellar soft tissue swelling of the right knee.  Right elbow x-ray with severe DJD.  CT of the head without any acute abnormality.  EKG showed sinus tachycardia.  Patient was then considered for admission to the hospital for further evaluation and treatment.       Assessment/Plan: Principal Problem:   Fall at home Active Problems:   Gout   Essential hypertension   Chronic diastolic CHF (congestive heart failure) (HCC)   Vitamin B12 deficiency   Centrilobular emphysema (HCC)   Permanent atrial fibrillation (HCC)   Normocytic anemia   Frequent PVCs   Abrasion of both knees   Hyperbilirubinemia   Physical deconditioning   Contusion of right knee   Syncope   Fever temperature max of 100.4 F.  Noted today.  Will continue to monitor.  Will get chest x-ray.  Urinalysis was negative.  Had loose stools.  Will check C. difficile.  If further spike of fever might need blood cultures.  Fall at home with abrasion of the knees and contusion of right knee   Physical deconditioning  Await PT OT evaluation.  Might need rehabilitation.  Patient states by himself at home and has some  assistance.  CT scan of the knee without any fracture and has osteoarthritis.  Had left leg wound and was in wound care.  Will get wound care evaluation of left forearm wound leg wounds.     Syncope  Continue telemetry monitoring.  Check carotid duplex, 2D echocardiogram.     Chronic diastolic CHF (congestive heart failure) Compensated.  Check 2D echocardiogram     Permanent atrial fibrillation   Frequent PVCs CHA?DS?-VASc Score of at least 5.  On metoprolol , magnesium  sulfate potassium.  Warfarin per pharmacy.    Essential hypertension Amlodipine  on hold.  Metoprolol  given.     Normocytic anemia Monitor hematocrit and hemoglobin.  Latest hemoglobin of 9.8     Hyperbilirubinemia Continue to monitor LFTs.     Vitamin B12 deficiency Continue B12 supplementation.     Centrilobular emphysema Continue bronchodilators Has some cough.  Will get chest x-ray.     Gout Continue allopurinol   DVT prophylaxis:  warfarin (COUMADIN ) tablet 2.5 mg   Disposition: Uncertain at this time we will get PT OT evaluation  Status is: Observation  The patient will require care spanning > 2 midnights and should be moved to inpatient because: Pending clinical improvement, need for PT evaluation, fever    Code Status:     Code Status: Full Code  Family Communication: Spoke with the patient's daughter on the phone and updated her about the clinical condition of the patient.  Consultants: None  Procedures: None  Anti-infectives:  None  Anti-infectives (From admission,  onward)    None       Subjective: Today, patient was seen and examined at bedside.  Patient denies any pain, nausea, vomiting but nursing staff reported 1 episode of fever.  Has been having some cough.    Objective: Vitals:   04/19/24 2344 04/20/24 0817  BP: 128/78 137/70  Pulse: 85 90  Resp:    Temp: 97.9 F (36.6 C) (!) 100.4 F (38 C)  SpO2: 100% 98%    Intake/Output Summary (Last 24 hours) at 04/20/2024  0908 Last data filed at 04/19/2024 2348 Gross per 24 hour  Intake 1153 ml  Output 130 ml  Net 1023 ml   Filed Weights   04/19/24 0945 04/20/24 0500  Weight: 72.7 kg 75.5 kg   Body mass index is 29.48 kg/m.   Physical Exam: GENERAL: Patient is alert awake and oriented. Not in obvious distress.  Elderly male, on room air, intermittently coughing, appears chronically ill HENT: No scleral pallor or icterus. Pupils equally reactive to light. Oral mucosa is moist NECK: is supple, no gross swelling noted. CHEST: .  Diminished breath sounds bilaterally.  Coarse breath sounds noted CVS: S1 and S2 heard, no murmur. Regular rate and rhythm.  ABDOMEN: Soft, non-tender, bowel sounds are present.  Umbilical hernia present EXTREMITIES: No edema. CNS: Cranial nerves are intact. No focal motor deficits. SKIN: warm and dry, multiple bruises over the skin with scabs.  Data Review: I have personally reviewed the following laboratory data and studies,  CBC: Recent Labs  Lab 04/19/24 1050 04/20/24 0624  WBC 10.0 8.2  NEUTROABS 8.3*  --   HGB 11.2* 9.8*  HCT 34.8* 31.2*  MCV 96.1 96.3  PLT 157 144*   Basic Metabolic Panel: Recent Labs  Lab 04/19/24 1050 04/20/24 0624  NA 133* 132*  K 3.9 3.6  CL 95* 98  CO2 24 24  GLUCOSE 80 101*  BUN 19 23  CREATININE 0.76 1.06  CALCIUM 8.6* 8.1*  MG 2.2  --   PHOS 3.2  --    Liver Function Tests: Recent Labs  Lab 04/19/24 1050 04/20/24 0624  AST 32 23  ALT 14 15  ALKPHOS 42 39  BILITOT 3.0* 1.6*  PROT 6.4* 5.4*  ALBUMIN  2.9* 2.5*   No results for input(s): LIPASE, AMYLASE in the last 168 hours. No results for input(s): AMMONIA in the last 168 hours. Cardiac Enzymes: Recent Labs  Lab 04/19/24 1050  CKTOTAL 241   BNP (last 3 results) Recent Labs    06/25/23 1133  BNP 352.7*    ProBNP (last 3 results) No results for input(s): PROBNP in the last 8760 hours.  CBG: Recent Labs  Lab 04/20/24 0500 04/20/24 0846   GLUCAP 118* 98   No results found for this or any previous visit (from the past 240 hours).   Studies: CT Knee Right Wo Contrast Result Date: 04/19/2024 CLINICAL DATA:  Pain and inability to bear weight on the right knee after recent fall. EXAM: CT OF THE RIGHT KNEE WITHOUT CONTRAST TECHNIQUE: Multidetector CT imaging of the right knee was performed according to the standard protocol. Multiplanar CT image reconstructions were also generated. RADIATION DOSE REDUCTION: This exam was performed according to the departmental dose-optimization program which includes automated exposure control, adjustment of the mA and/or kV according to patient size and/or use of iterative reconstruction technique. COMPARISON:  Right knee radiographs dated 04/19/2024. FINDINGS: Motion degradation slightly limits the sensitivity of this exam. Bones/Joint/Cartilage No acute fracture or dislocation. Severe medial  femorotibial compartment joint space narrowing resulting in bone-on-bone contact, subchondral sclerosis, and marginal osteophytosis. Moderate patellofemoral compartment joint space narrowing and osteophytosis. Foci of mineralization in the medial and lateral compartments, may be dystrophic or reflect chondrocalcinosis. Moderate knee joint effusion. No evidence of lipohemarthrosis. Patellar enthesopathy. Ligaments Ligaments are suboptimally evaluated by CT. Muscles and Tendons Muscles are within normal limits for age. Small Baker's cyst. Visualized patellar and quadriceps tendons appear grossly intact. Soft tissue Mild subcutaneous edema anteriorly.  No loculated fluid collection. IMPRESSION: 1. No acute fracture identified.  No dislocation. 2. Moderate to severe tricompartmental osteoarthritis of the right knee, most pronounced in the medial femorotibial compartment resulting in bone-on-bone contact. 3. Moderate-sized knee joint effusion. No evidence of lipohemarthrosis. Electronically Signed   By: Harrietta Sherry M.D.    On: 04/19/2024 12:48   DG Elbow Complete Left Result Date: 04/19/2024 CLINICAL DATA:  88 year old male status post fall earlier this week. Unable to weightbear. EXAM: LEFT ELBOW - COMPLETE 3+ VIEW COMPARISON:  None Available. FINDINGS: Four views. Severe left elbow joint degeneration. Joint space loss, subchondral sclerosis, osteophytosis. Bulky heterotopic calcification or ossification of the adjacent soft tissues. No definite joint effusion. No acute fracture or dislocation identified. Abnormal and asymmetric soft tissue contours about the left elbow and visible forearm (image #1). No soft tissue gas identified. Calcified peripheral vascular disease. IMPRESSION: 1. Possible soft tissue hematoma about the left elbow and in the forearm. 2. Severe left elbow joint degeneration. No acute fracture or dislocation identified. 3. Calcified peripheral vascular disease. Electronically Signed   By: VEAR Hurst M.D.   On: 04/19/2024 11:17   DG Elbow Complete Right Result Date: 04/19/2024 CLINICAL DATA:  88 year old male status post fall earlier this week. Unable to weightbear. EXAM: RIGHT ELBOW - COMPLETE 3+ VIEW COMPARISON:  None Available. FINDINGS: Four views. Severe degeneration throughout the right elbow. Joint space loss, subchondral sclerosis, osteophytosis, and bulky heterotopic calcification or ossification. This includes a large 2 cm antecubital fossa calcification. No obvious joint effusion. No superimposed acute fracture or dislocation identified. Calcified peripheral vascular disease. IMPRESSION: 1. Severe right elbow degeneration. Bulky heterotopic calcification or ossification. No acute fracture or dislocation identified. 2. Calcified peripheral vascular disease. Electronically Signed   By: VEAR Hurst M.D.   On: 04/19/2024 11:15   DG Knee Complete 4 Views Right Result Date: 04/19/2024 CLINICAL DATA:  88 year old male status post fall earlier this week. Unable to weightbear. EXAM: RIGHT KNEE - COMPLETE 4+ VIEW  COMPARISON:  Knee series 03/15/2022. FINDINGS: Four views including cross-table lateral. Chronic severe tricompartmental degenerative spurring, severe chronic medial right knee joint space loss. Medial and lateral compartment dystrophic calcification. Suprapatellar soft tissue swelling which could be joint effusion or hematoma. Patella appears intact. No acute osseous abnormality identified. Calcified peripheral vascular disease. IMPRESSION: 1. Suprapatellar soft tissue swelling could be joint effusion or hematoma. 2. Chronic severe right knee joint degeneration. No acute fracture or dislocation identified. Electronically Signed   By: VEAR Hurst M.D.   On: 04/19/2024 11:13   CT Head Wo Contrast Result Date: 04/19/2024 CLINICAL DATA:  88 year old male status post fall earlier this week. Unable to weightbear. EXAM: CT HEAD WITHOUT CONTRAST TECHNIQUE: Contiguous axial images were obtained from the base of the skull through the vertex without intravenous contrast. RADIATION DOSE REDUCTION: This exam was performed according to the departmental dose-optimization program which includes automated exposure control, adjustment of the mA and/or kV according to patient size and/or use of iterative reconstruction technique. COMPARISON:  Head  CT 01/13/2024. FINDINGS: Brain: Stable cerebral volume. No midline shift, ventriculomegaly, mass effect, evidence of mass lesion, intracranial hemorrhage or evidence of cortically based acute infarction. Chronic dural calcification incidentally noted. Patchy and confluent moderate bilateral cerebral white matter hypodensity. Chronic deep white matter capsule involvement. Stable gray-white matter differentiation throughout the brain. Vascular: Calcified atherosclerosis at the skull base. No suspicious intracranial vascular hyperdensity. Skull: Appears stable and intact. No acute osseous abnormality identified. Sinuses/Orbits: Visualized paranasal sinuses and mastoids are clear. Other:  Calcified scalp vessel atherosclerosis. Stable orbit and scalp soft tissues. IMPRESSION: 1. No acute intracranial abnormality or recent traumatic injury identified. 2. Stable non contrast CT appearance of chronic small vessel disease. 3. Calcified scalp vessel atherosclerosis which can be seen with end stage renal disease. Electronically Signed   By: VEAR Hurst M.D.   On: 04/19/2024 11:12     Vernal Alstrom, MD  [facility] 04/20/2024  If 7PM-7AM, please contact night-coverage

## 2024-04-20 NOTE — Evaluation (Signed)
 Physical Therapy Evaluation Patient Details Name: [name] MRN: [id number] DOB: [birth year] Today's Date: 04/20/2024  History of Present Illness  88 y.o. male admitted on 04/19/24 for fall at home with abrasion of the knees and contusion of right knee.  Past medical history significant of aortic stenosis, gout, hypertension, mitral valve prolapse, aortic valve replacement, with a pericardial tissue valve, back surgery, paroxysmal atrial fibrillation, chronic diastolic heart failure, centrilobular emphysema,  non-small cell lung cancer,  spinal stenosis, vitamin B12 deficiency, vitamin D  deficiency  Clinical Impression  Pt admitted with above diagnosis.  Pt currently with functional limitations due to the deficits listed below (see PT Problem List). Pt will benefit from acute skilled PT to increase their independence and safety with mobility to allow discharge.  Pt reports pain and weakness limiting participation today however pt did agree to at least sit at EOB.  Pt's lunch present however pt declined sitting up in recliner or being hungry.  Pt assisted back to bed and repositioned to comfort.  Pt reports multiple falls at home over the past week and living alone.  Pt does not appear safe to return home in current condition.  Patient will benefit from continued inpatient follow up therapy, <3 hours/day.          If plan is discharge home, recommend the following: Two people to help with walking and/or transfers;Assistance with cooking/housework;Help with stairs or ramp for entrance;Assist for transportation   Can travel by private vehicle        Equipment Recommendations Wheelchair (measurements PT);Wheelchair cushion (measurements PT)  Recommendations for Other Services       Functional Status Assessment       Precautions / Restrictions Precautions Precautions: Fall      Mobility  Bed Mobility Overal bed mobility: Needs Assistance Bed Mobility: Supine to Sit, Sit to Supine      Supine to sit: Max assist, +2 for physical assistance Sit to supine: Max assist, +2 for physical assistance   General bed mobility comments: pt encouraged to assist as able, max +2 required due to weakness and pain; assist for upper and lower body as well as utilized bed pad for scooting and positioning    Transfers                   General transfer comment: pt declined    Ambulation/Gait                  Stairs            Wheelchair Mobility     Tilt Bed    Modified Rankin (Stroke Patients Only)       Balance Overall balance assessment: Needs assistance, History of Falls Sitting-balance support: Feet supported, No upper extremity supported Sitting balance-Leahy Scale: Fair                                       Pertinent Vitals/Pain Pain Assessment Pain Assessment: Faces Faces Pain Scale: Hurts even more Pain Location: everything Pain Descriptors / Indicators: Sore Pain Intervention(s): Repositioned, Monitored during session    Home Living Family/patient expects to be discharged to:: Private residence Living Arrangements: Alone Available Help at Discharge: Family;Available PRN/intermittently Type of Home: House Home Access: Stairs to enter Entrance Stairs-Rails:  (unilateral) Entrance Stairs-Number of Steps: 6 Alternate Level Stairs-Number of Steps: his bedroom and bathroom are on the main level of the  home Home Layout: Two level;Able to live on main level with bedroom/bathroom Home Equipment: Shower seat - built in;Rollator (4 wheels);BSC/3in1;Wheelchair - manual;Crutches      Prior Function Prior Level of Function : Independent/Modified Independent             Mobility Comments: Independent with ambulation. ADLs Comments: Independent with ADLs, cooking, cleaning and driving.     Extremity/Trunk Assessment   Upper Extremity Assessment Upper Extremity Assessment: RUE deficits/detail;LUE  deficits/detail RUE Deficits / Details: Chronic shoulder AROM limitations with ROM for shoulder flexion <1/2 normal AROM. Pt attributes this to chronic arthritis. Arthritic changes of hands LUE Deficits / Details: Chronic shoulder AROM limitations with ROM for shoulder flexion <1/2 normal AROM. Pt attributes this to chronic arthritis. Arthritic changes of hands    Lower Extremity Assessment Lower Extremity Assessment: Generalized weakness (bil LEs with dressings, knees with scabs and abrasions (WOC has seen pt)) RLE Deficits / Details:  (scabs and abrasions noted) RLE: Unable to fully assess due to pain LLE Deficits / Details:  (scabs and abrasions noted)    Cervical / Trunk Assessment Cervical / Trunk Assessment: Kyphotic;Other exceptions Cervical / Trunk Exceptions: forward head posture  Communication   Communication Communication: No apparent difficulties    Cognition Arousal: Alert Behavior During Therapy: WFL for tasks assessed/performed   PT - Cognitive impairments: No apparent impairments                         Following commands: Intact       Cueing       General Comments      Exercises     Assessment/Plan    PT Assessment Patient needs continued PT services  PT Problem List Decreased strength;Decreased activity tolerance;Decreased balance;Decreased mobility;Decreased knowledge of use of DME;Decreased skin integrity;Pain       PT Treatment Interventions Gait training;DME instruction;Balance training;Functional mobility training;Therapeutic exercise;Therapeutic activities;Patient/family education    PT Goals (Current goals can be found in the Care Plan section)  Acute Rehab PT Goals PT Goal Formulation: With patient Time For Goal Achievement: 05/04/24 Potential to Achieve Goals: Good    Frequency Min 3X/week     Co-evaluation               AM-PAC PT 6 Clicks Mobility  Outcome Measure Help needed turning from your back to your side  while in a flat bed without using bedrails?: A Lot Help needed moving from lying on your back to sitting on the side of a flat bed without using bedrails?: Total Help needed moving to and from a bed to a chair (including a wheelchair)?: Total Help needed standing up from a chair using your arms (e.g., wheelchair or bedside chair)?: Total Help needed to walk in hospital room?: Total Help needed climbing 3-5 steps with a railing? : Total 6 Click Score: 7    End of Session   Activity Tolerance: Patient limited by pain;Patient limited by fatigue Patient left: with bed alarm set;with call bell/phone within reach;in bed Nurse Communication: Mobility status PT Visit Diagnosis: Difficulty in walking, not elsewhere classified (R26.2);Muscle weakness (generalized) (M62.81);Repeated falls (R29.6)    Time: 8665-8652 PT Time Calculation (min) (ACUTE ONLY): 13 min   Charges:   PT Evaluation $PT Eval Low Complexity: 1 Low   PT General Charges $$ ACUTE PT VISIT: 1 Visit       Tari PT, DPT Physical Therapist Acute Rehabilitation Services Office: [phone]   Tari CROME [place] 04/20/2024,  3:59 PM

## 2024-04-20 NOTE — Plan of Care (Signed)
   Problem: Education: Goal: Knowledge of General Education information will improve Description Including pain rating scale, medication(s)/side effects and non-pharmacologic comfort measures Outcome: Progressing

## 2024-04-20 NOTE — Progress Notes (Signed)
 Carotid exam is completed. Kleo Dungee, RVT

## 2024-04-20 NOTE — Consult Note (Addendum)
 WOC Nurse Consult Note: patient is followed at [facility] as outpatient for L lower leg wound, last seen 04/15/2024 and they are using Xeroform gauze  Reason for Consult: left leg and left arm wounds  Wound type: Full thickness L knee, L lateral lower leg, L wrist/lateral  forearm, L posterior arm from falls per patient  Pressure Injury POA: NA  Measurement: 1.  L knee 4 cm x 5 cm x 0.1 cm 50% red 50% yellow  2.  L lower lateral leg 12 cm x 3 cm x 0.1 cm 50% yellow 50% red  3.  L lateral forearm/wrist 18 cm x 7 cm x 0.1 cm 75% yellow 25% red 4  L posterior arm 5 cm x 3 cm 100% tan   Wound bed: as above  Drainage (amount, consistency, odor) tan exudate to L lower leg and L arm wounds  Periwound: multiple old and new abrasions and ecchymosis  Dressing procedure/placement/frequency:  Cleanse L knee wound with Vashe, do not rinse and allow to air dry.  Apply Mupirocin  ointment to wound bed 2 times a day and secure with silicone foam.  Cleanse L lower lateral leg wound with Vashe, allow to air dry. Apply Xeroform gauze ([place]son [id number]) to wound bed daily, cover with ABD pad and secure with Kerlix roll gauze.  Cleanse L forearm/wrist and L posterior arm wounds with Vashe wound cleanser, Apply Vashe moistened gauze to  wound beds daily, cover with ABD pad and wrap with Kerlix roll gauze to secure.    POC discussed with bedside nurse. WOC team will not follow.   Patient already follows with wound care center as above.  Patient will benefit from ongoing management of the left arm wounds as well at the wound care center.    Thank you,    Powell Bar MSN, RN-BC, [employer]

## 2024-04-20 NOTE — Progress Notes (Signed)
 PHARMACY - ANTICOAGULATION CONSULT NOTE  Pharmacy Consult for resuming warfarin  Indication: atrial fibrillation  No Known Allergies  Patient Measurements: Height: 5' 3 (160 cm) Weight: 75.5 kg (166 lb 7.2 oz) IBW/kg (Calculated) : 56.9 HEPARIN  DW (KG): 71.6  Vital Signs: Temp: 97.9 F (36.6 C) (08/23 2344) Temp Source: Oral (08/23 2344) BP: 128/78 (08/23 2344) Pulse Rate: 85 (08/23 2344)  Labs: Recent Labs    04/19/24 1050 04/20/24 0624  HGB 11.2* 9.8*  HCT 34.8* 31.2*  PLT 157 144*  LABPROT 15.9* 16.3*  INR 1.2 1.2  CREATININE 0.76 1.06  CKTOTAL 241  --     Estimated Creatinine Clearance: 45.5 mL/min (by C-G formula based on SCr of 1.06 mg/dL).   Medical History: Past Medical History:  Diagnosis Date   Aortic stenosis    Atrial fibrillation (HCC)    Baker cyst    Left knee   DJD (degenerative joint disease)    knees   Dysrhythmia    Eczema    Erectile dysfunction    Gout    History of hiatal hernia    History of radiation therapy    Right lung- 04/21/21-05/03/21 Dr. Lynwood Nasuti   HTN (hypertension)    MVP (mitral valve prolapse)    Shortness of breath dyspnea    with activity    Medications:  Medications Prior to Admission  Medication Sig Dispense Refill Last Dose/Taking   allopurinol  (ZYLOPRIM ) 300 MG tablet Take 1 tablet by mouth once daily 90 tablet 1 04/17/2024   amLODipine  (NORVASC ) 5 MG tablet Take 1 tablet by mouth once daily 90 tablet 1 04/17/2024   cyanocobalamin  (VITAMIN B12) 500 MCG tablet Take 1 tablet (500 mcg total) by mouth daily. (Patient taking differently: Take 500 mcg by mouth daily. BID) 30 tablet 0 04/17/2024   furosemide  (LASIX ) 20 MG tablet Take 3 tablets (60 mg total) by mouth daily. (Patient taking differently: Take 60 mg by mouth in the morning.) 270 tablet 3 04/17/2024   potassium chloride  SA (KLOR-CON  M) 20 MEQ tablet Take 1 tablet (20 mEq total) by mouth daily. (Patient taking differently: Take 20 mEq by mouth in the  morning.) 90 tablet 3 04/17/2024   predniSONE  (DELTASONE ) 20 MG tablet Take 1 tablet by mouth once daily with breakfast 90 tablet 0 04/17/2024   traMADol  (ULTRAM ) 50 MG tablet TAKE 1 TABLET BY MOUTH EVERY 12 HOURS AS NEEDED 60 tablet 0 04/17/2024   traZODone  (DESYREL ) 50 MG tablet TAKE 1 TABLET BY MOUTH AT BEDTIME AS NEEDED (Patient taking differently: Take 50 mg by mouth at bedtime as needed for sleep.) 90 tablet 0 04/17/2024   warfarin (COUMADIN ) 5 MG tablet TAKE 1/2 (ONE-HALF) A TABLET BY MOUTH ONCE DAILY EXCEPT  TAKE 1 TABLET ON WEDNESDAY AS DIRECTED  BY  ANTICOAGULATION  CLINIC (Patient taking differently: Take 2.5 mg by mouth daily. TAKE 1/2 (ONE-HALF) A TABLET BY MOUTH ONCE DAILY EXCEPT  TAKE 1 TABLET ON WEDNESDAY AS DIRECTED  BY  ANTICOAGULATION  CLINIC) 75 tablet 0 04/17/2024   tamsulosin  (FLOMAX ) 0.4 MG CAPS capsule TAKE 1 CAPSULE BY MOUTH ONCE DAILY AFTER BREAKFAST (Patient not taking: Reported on 04/19/2024) 90 capsule 0 Not Taking   Scheduled:   allopurinol   300 mg Oral Daily   cyanocobalamin   500 mcg Oral BID   feeding supplement  237 mL Oral BID BM   furosemide   60 mg Oral q AM   potassium chloride  SA  20 mEq Oral q AM   sodium chloride  flush  3 mL Intravenous Q12H   Warfarin - Pharmacist Dosing Inpatient   Does not apply q1600    Assessment: 53 yoM presented to the ED s/p fall with LOC a few days prior to presentation. Patient has a chronic wound on LLE and has reported poor po intake and was dehydrated on physical exam. Pertinent PMH of GIB (08/2023), non-small cell lung cancer, nontraumatic epidural hematoma atrial fibrillation on warfarin (previously switched from eliquis  10/02/23 due to affordability issues). Pharmacy consulted to continue warfarin while inpatient.   Goal of Therapy:  INR 2-2.5 (per anticoagulation summary on 04/15/2024) Monitor platelets by anticoagulation protocol: Yes   Today, 04/19/2024: PTA regimen: 2.5mg  daily,   CBC low - Hgb 9.8, PLTc 144 INR 1.2 -  subtherapeutic, dose given last night at 1951, not much time to see effect yet  Major drug interactions: Allopurinol  may increase the anticoagulation effects of warfarin   Plan:  Warfarin 2.5 mg PO x1  Daily INR Monitor for signs of bleeding or thrombosis   Dolphus Roller, PharmD, BCPS 04/20/2024 7:38 AM

## 2024-04-20 NOTE — Plan of Care (Signed)

## 2024-04-20 NOTE — Hospital Course (Signed)
 [name] is a 88 y.o. male with past medical history significant of aortic stenosis, status post aortic valve replacement, paroxysmal atrial fibrillation, chronic diastolic heart failure, mitral valve prolapse, hypertension, centrilobular emphysema, gout,  non-small cell lung cancer,  spinal stenosis, vitamin B12 deficiency, vitamin D  deficiency w presented to the hospital after sustaining a fall 4 days prior to presentation.  Patient complains of pain and swelling and redness in the knee after the fall.  In the ED vitals were stable.  Labs were notable for normal CBC.  CK level of 241.  CMP showed mild hyponatremia with sodium of 133.  Imaging showed some prepatellar soft tissue swelling of the right knee.  Right elbow x-ray with severe DJD.  CT of the head without any acute abnormality.  EKG showed sinus tachycardia.  Patient was then considered for admission to the hospital for further evaluation and treatment.    Fall at home with abrasion of the knees and contusion of right knee   Physical deconditioning  Continue PT OT.     Syncope  Continue telemetry monitoring.  Check carotid duplex 2D echocardiogram.     Chronic diastolic CHF (congestive heart failure) Compensated.  Check 2D echocardiogram     Permanent atrial fibrillation   Frequent PVCs CHA?DS?-VASc Score of at least 5.  On metoprolol  magnesium  sulfate potassium.  Warfarin per pharmacy.    Essential hypertension Amlodipine  on hold.  Metoprolol  given.     Normocytic anemia Monitor hematocrit and hemoglobin.  Latest hemoglobin of 9.8     Hyperbilirubinemia Continue to monitor LFTs.     Vitamin B12 deficiency Continue B12 supplementation.     Centrilobular emphysema Continue bronchodilators with Xopenex     Gout Continue allopurinol 

## 2024-04-20 NOTE — Evaluation (Signed)
 Occupational Therapy Evaluation Patient Details Name: [name] MRN: [id number] DOB: 1936/06/16 Today's Date: 04/20/2024   History of Present Illness   88 yr old male admitted on 04/19/24 for fall at home with abrasion of the knees and contusion of right knee.  Past medical history significant of aortic stenosis, gout, hypertension, mitral valve prolapse, aortic valve replacement, with a pericardial tissue valve, back surgery, paroxysmal atrial fibrillation, chronic diastolic heart failure, centrilobular emphysema,  non-small cell lung cancer,  spinal stenosis, vitamin B12 deficiency, vitamin D  deficiency     Clinical Impressions The pt is currently presenting significantly below his baseline level of functioning for self-care management. He is limited by the below listed deficits (see OT problem list). During the session, he required max assist for supine to sit, max assist for lower body dressing seated EOB, and min assist for lateral scooting along the EOB. He declined to attempt sit to stand, due to increased R knee pain and feeling as though he would not be able to achieve due to generalized weakness. At current, he would not be safe to return home alone, given his functional limitations and need for increased physical assistance. He will benefit from further OT services to maximize his independence with self-care tasks and to decrease the risk for further weakness and deconditioning. Patient will benefit from continued inpatient follow up therapy, <3 hours/day.      If plan is discharge home, recommend the following:   A lot of help with walking and/or transfers;A lot of help with bathing/dressing/bathroom;Assistance with cooking/housework;Assist for transportation;Help with stairs or ramp for entrance     Functional Status Assessment   Patient has had a recent decline in their functional status and demonstrates the ability to make significant improvements in function in a  reasonable and predictable amount of time.     Equipment Recommendations   Other (comment) (defer to next level of care)     Recommendations for Other Services         Precautions/Restrictions   Precautions Precautions: Fall     Mobility Bed Mobility Overal bed mobility: Needs Assistance Bed Mobility: Supine to Sit, Sit to Supine     Supine to sit: Max assist, HOB elevated, Used rails Sit to supine: Max assist        Transfers      General transfer comment: pt declined      Balance     Sitting balance-Leahy Scale: Fair         Standing balance comment: unable to assess           ADL either performed or assessed with clinical judgement   ADL Overall ADL's : Needs assistance/impaired Eating/Feeding: Independent;Bed level;Sitting   Grooming: Minimal assistance;Set up;Sitting Grooming Details (indicate cue type and reason): simulated seated EOB Upper Body Bathing: Set up;Sitting   Lower Body Bathing: Moderate assistance;Sitting/lateral leans   Upper Body Dressing : Minimal assistance;Sitting   Lower Body Dressing: Maximal assistance;Sitting/lateral leans           Vision   Additional Comments: He correctly read the time depicted on the wall clock.            Pertinent Vitals/Pain Pain Assessment Pain Assessment: 0-10 Pain Score: 8  Pain Location:  (R knee with activity/movement) Pain Descriptors / Indicators: Sore Pain Intervention(s): Repositioned, Monitored during session, Limited activity within patient's tolerance     Extremity/Trunk Assessment Upper Extremity Assessment Upper Extremity Assessment: RUE deficits/detail;LUE deficits/detail RUE Deficits / Details: Chronic shoulder AROM  limitations with ROM for shoulder flexion <1/2 normal AROM. Pt attributes this to chronic arthritis. Arthritic changes of hands LUE Deficits / Details: Chronic shoulder AROM limitations with ROM for shoulder flexion <1/2 normal AROM. Pt attributes  this to chronic arthritis. Arthritic changes of hands   Lower Extremity Assessment Lower Extremity Assessment: LLE deficits/detail;RLE deficits/detail;Generalized weakness RLE Deficits / Details:  (scabs and abrasions noted) RLE: Unable to fully assess due to pain LLE Deficits / Details:  (scabs and abrasions noted)     Communication Communication Communication: No apparent difficulties   Cognition Arousal: Alert Behavior During Therapy: WFL for tasks assessed/performed               OT - Cognition Comments: Oriented x4                 Following commands: Intact                  Home Living Family/patient expects to be discharged to:: Private residence Living Arrangements: Alone Available Help at Discharge: Family;Available PRN/intermittently Type of Home: House Home Access: Stairs to enter [employer] of Steps: 6 Entrance Stairs-Rails:  (unilateral) Home Layout: Two level;Able to live on main level with bedroom/bathroom Alternate Level Stairs-Number of Steps: his bedroom and bathroom are on the main level of the home   Bathroom Shower/Tub: Walk-in shower         Home Equipment: Shower seat - built in;Rollator (4 wheels);BSC/3in1;Wheelchair - manual;Crutches          Prior Functioning/Environment Prior Level of Function : Independent/Modified Independent             Mobility Comments: Independent with ambulation. ADLs Comments: Independent with ADLs, cooking, cleaning and driving.    OT Problem List: Decreased strength;Decreased range of motion;Decreased activity tolerance;Impaired balance (sitting and/or standing);Decreased coordination;Decreased knowledge of use of DME or AE;Pain   OT Treatment/Interventions: Self-care/ADL training;Therapeutic exercise;Therapeutic activities;Energy conservation;Patient/family education;DME and/or AE instruction;Balance training      OT Goals(Current goals can be found in the care plan section)    Acute Rehab OT Goals Patient Stated Goal: to get better and return home OT Goal Formulation: With patient Time For Goal Achievement: 05/04/24 Potential to Achieve Goals: Good ADL Goals Pt Will Perform Lower Body Dressing: with contact guard assist;sitting/lateral leans;sit to/from stand;with adaptive equipment Pt Will Transfer to Toilet: with contact guard assist;ambulating Pt Will Perform Toileting - Clothing Manipulation and hygiene: with contact guard assist;sit to/from stand Additional ADL Goal #1: The pt will perform bed mobility with CGA, in prep for progressive ADL participation.   OT Frequency:  Min 2X/week       AM-PAC OT 6 Clicks Daily Activity     Outcome Measure Help from another person eating meals?: None Help from another person taking care of personal grooming?: A Little Help from another person toileting, which includes using toliet, bedpan, or urinal?: A Lot Help from another person bathing (including washing, rinsing, drying)?: A Lot Help from another person to put on and taking off regular upper body clothing?: A Little Help from another person to put on and taking off regular lower body clothing?: A Lot 6 Click Score: 16   End of Session Equipment Utilized During Treatment: Other (comment) (N/A) Nurse Communication: Mobility status  Activity Tolerance: Patient limited by pain Patient left: in bed;with call bell/phone within reach;with bed alarm set  OT Visit Diagnosis: History of falling (Z91.81);Muscle weakness (generalized) (M62.81);Other abnormalities of gait and mobility (R26.89);Pain Pain - Right/Left: Right  Pain - part of body: Knee                Time: 1250-1311 OT Time Calculation (min): 21 min Charges:  OT General Charges $OT Visit: 1 Visit OT Evaluation $OT Eval Moderate Complexity: 1 Mod    Delanna JINNY Lesches, OTR/L 04/20/2024, 3:44 PM

## 2024-04-21 DIAGNOSIS — S80212A Abrasion, left knee, initial encounter: Secondary | ICD-10-CM | POA: Diagnosis not present

## 2024-04-21 DIAGNOSIS — S80211A Abrasion, right knee, initial encounter: Secondary | ICD-10-CM | POA: Diagnosis not present

## 2024-04-21 DIAGNOSIS — Y92009 Unspecified place in unspecified non-institutional (private) residence as the place of occurrence of the external cause: Secondary | ICD-10-CM | POA: Diagnosis not present

## 2024-04-21 DIAGNOSIS — W19XXXA Unspecified fall, initial encounter: Secondary | ICD-10-CM | POA: Diagnosis not present

## 2024-04-21 LAB — GLUCOSE, CAPILLARY: Glucose-Capillary: 124 mg/dL — ABNORMAL HIGH (ref 70–99)

## 2024-04-21 LAB — BASIC METABOLIC PANEL WITH GFR
Anion gap: 8 (ref 5–15)
BUN: 17 mg/dL (ref 8–23)
CO2: 24 mmol/L (ref 22–32)
Calcium: 8.9 mg/dL (ref 8.9–10.3)
Chloride: 97 mmol/L — ABNORMAL LOW (ref 98–111)
Creatinine, Ser: 1.1 mg/dL (ref 0.61–1.24)
GFR, Estimated: >60 mL/min (ref >60)
Glucose, Bld: 87 mg/dL (ref 70–99)
Potassium: 4.5 mmol/L (ref 3.5–5.1)
Sodium: 129 mmol/L — ABNORMAL LOW (ref 135–145)

## 2024-04-21 LAB — CBC
HCT: 31.2 % — ABNORMAL LOW (ref 39.0–52.0)
Hemoglobin: 10.4 g/dL — ABNORMAL LOW (ref 13.0–17.0)
MCH: 31 pg (ref 26.0–34.0)
MCHC: 33.3 g/dL (ref 30.0–36.0)
MCV: 93.1 fL (ref 80.0–100.0)
Platelets: 136 K/uL — ABNORMAL LOW (ref 150–400)
RBC: 3.35 MIL/uL — ABNORMAL LOW (ref 4.22–5.81)
RDW: 14.5 % (ref 11.5–15.5)
WBC: 9 K/uL (ref 4.0–10.5)
nRBC: 0 % (ref 0.0–0.2)

## 2024-04-21 LAB — PROTIME-INR
INR: 1.2 (ref 0.8–1.2)
Prothrombin Time: 15.6 s — ABNORMAL HIGH (ref 11.4–15.2)

## 2024-04-21 LAB — MAGNESIUM: Magnesium: 1.5 mg/dL — ABNORMAL LOW (ref 1.7–2.4)

## 2024-04-21 MED ORDER — MAGNESIUM OXIDE -MG SUPPLEMENT 400 (240 MG) MG PO TABS
400.0000 mg | ORAL_TABLET | Freq: Two times a day (BID) | ORAL | Status: DC
  Administered 2024-04-21 – 2024-04-23 (×5): 400 mg via ORAL
  Filled 2024-04-21 (×5): qty 1

## 2024-04-21 MED ORDER — WARFARIN SODIUM 5 MG PO TABS
5.0000 mg | ORAL_TABLET | Freq: Once | ORAL | Status: AC
  Administered 2024-04-21: 5 mg via ORAL
  Filled 2024-04-21: qty 1

## 2024-04-21 MED ORDER — MAGNESIUM SULFATE 2 GM/50ML IV SOLN
2.0000 g | Freq: Once | INTRAVENOUS | Status: AC
  Administered 2024-04-21: 2 g via INTRAVENOUS
  Filled 2024-04-21: qty 50

## 2024-04-21 NOTE — TOC Initial Note (Signed)
 Transition of Care [facility]) - Initial/Assessment Note    Patient Details  Name: [name] MRN: [id number] Date of Birth: March 16, 1936  [facility] of [facility]) CM/SW Contact:    Sheri ONEIDA Sharps, LCSW Phone Number: 04/21/2024, 4:51 PM  Clinical Narrative:                 Pt from home alone. Pt had HHRN through [facility]. Pt recommended for SNF, pt and family accepting of rec. PASRR obtained and FL2 completed. SNF ref faxed awaiting bed offers. Dtr would like SNF near her in [place].  Expected Discharge Plan: Skilled Nursing Facility Barriers to Discharge: Continued Medical Work up   Patient Goals and CMS Choice Patient states their goals for this hospitalization and ongoing recovery are:: retrun home CMS Medicare.gov Compare Post Acute Care list provided to:: Patient Choice offered to / list presented to : Patient [place] ownership interest in [facility].provided to:: Patient    Expected Discharge Plan and Services In-house Referral: NA Discharge Planning Services: NA Post Acute Care Choice: Skilled Nursing Facility Living arrangements for the past 2 months: Single Family Home                 DME Arranged: N/A DME Agency: NA       HH Arranged: NA HH Agency: NA        Prior Living Arrangements/Services Living arrangements for the past 2 months: Single Family Home Lives with:: Self Patient language and need for interpreter reviewed:: Yes Do you feel safe going back to the place where you live?: Yes      Need for Family Participation in Patient Care: Yes (Comment) Care giver support system in place?: Yes (comment) Current home services: Home RN, Homehealth aide Criminal Activity/Legal Involvement Pertinent to Current Situation/Hospitalization: No - Comment as needed  Activities of Daily Living   ADL Screening (condition at time of admission) Independently performs ADLs?: No Does the patient have a NEW difficulty with  bathing/dressing/toileting/self-feeding that is expected to last >3 days?: Yes (Initiates electronic notice to provider for possible OT consult) Does the patient have a NEW difficulty with getting in/out of bed, walking, or climbing stairs that is expected to last >3 days?: Yes (Initiates electronic notice to provider for possible PT consult) Does the patient have a NEW difficulty with communication that is expected to last >3 days?: No Is the patient deaf or have difficulty hearing?: No Does the patient have difficulty seeing, even when wearing glasses/contacts?: No Does the patient have difficulty concentrating, remembering, or making decisions?: No  Permission Sought/Granted                  Emotional Assessment Appearance:: Appears stated age Attitude/Demeanor/Rapport: Engaged Affect (typically observed): Accepting Orientation: : Oriented to Self, Oriented to Place, Oriented to  Time, Oriented to Situation Alcohol / Substance Use: Not Applicable Psych Involvement: No (comment)  Admission diagnosis:  Dehydration [E86.0] Failure to thrive in adult [R62.7] Fall at home [W19.CHERENE, Y92.009] Fall, initial encounter [id number]] Patient Active Problem List   Diagnosis Date Noted   Fall at home 04/19/2024   Permanent atrial fibrillation (HCC) 04/19/2024   Normocytic anemia 04/19/2024   Frequent PVCs 04/19/2024   Abrasion of both knees 04/19/2024   Hyperbilirubinemia 04/19/2024   Physical deconditioning 04/19/2024   Contusion of right knee 04/19/2024   Syncope 04/19/2024   Atherosclerosis of native arteries of extremity with intermittent claudication (HCC) 02/16/2024   Melena 09/20/2023   Acute blood loss anemia 09/20/2023  Symptomatic anemia 09/20/2023   GI bleed 09/19/2023   Hypersensitivity pneumonitis (HCC) 06/04/2023   Centrilobular emphysema (HCC) 06/04/2023   Elevated PSA 10/30/2022   Non-small cell lung cancer (HCC) 06/23/2022   Hemoptysis 03/07/2021   Pulmonary  nodules 03/07/2021   Hypokalemia 09/24/2019   Vitamin D  deficiency 09/24/2019   Vitamin B12 deficiency 09/24/2019   Chronic diastolic CHF (congestive heart failure) (HCC) 09/17/2018   Nontraumatic epidural hematoma (HCC) 05/01/2017   Routine general medical examination at a health care facility 08/17/2015   Dyspnea 06/10/2015   History of total knee arthroplasty 04/14/2015   Spinal stenosis, lumbar region, with neurogenic claudication 01/15/2015   Encounter for therapeutic drug monitoring 10/13/2013   S/P AVR (aortic valve replacement) 07/03/2013   Urticaria 11/27/2011   Dysphonia 11/07/2011   Anxiety 04/04/2011   CAROTID BRUIT 03/23/2009   Osteoarthritis 03/22/2009   Chronic atrial fibrillation (HCC) 03/16/2009   Aortic valve disorder 01/21/2008   ERECTILE DYSFUNCTION, 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   Gout 04/17/2007   Essential hypertension 04/17/2007   PCP:  Theophilus Delma Tully CINDERELLA, MD Pharmacy:   [facility] [address] [place] - [address] [place] [address] Phone: [phone] Fax: [phone]     Social Drivers of Health (SDOH) Social History: SDOH Screenings   Food Insecurity: No Food Insecurity (04/19/2024)  Housing: Unknown (04/19/2024)  Transportation Needs: No Transportation Needs (04/19/2024)  Utilities: Not At Risk (04/19/2024)  Alcohol Screen: Low Risk  (10/30/2023)  Depression (PHQ2-9): Low Risk  (10/30/2023)  Financial Resource Strain: Low Risk  (10/30/2023)  Physical Activity: Insufficiently Active (10/30/2023)  Social Connections: Moderately Isolated (04/19/2024)  Stress: No Stress Concern Present (10/30/2023)  Tobacco Use: Medium Risk (04/19/2024)  Health Literacy: Adequate Health Literacy (10/30/2023)   SDOH Interventions:     Readmission Risk Interventions     No data to display

## 2024-04-21 NOTE — Progress Notes (Signed)
 PHARMACY - ANTICOAGULATION CONSULT NOTE  Pharmacy Consult for Coumadin  Indication: atrial fibrillation  No Known Allergies  Patient Measurements: Height: 5' 3 (160 cm) Weight: 74.6 kg (164 lb 7.4 oz) IBW/kg (Calculated) : 56.9 HEPARIN  DW (KG): 71.6  Vital Signs: Temp: 97.5 F (36.4 C) (08/25 0444) Temp Source: Oral (08/25 0444) BP: 161/81 (08/25 0444) Pulse Rate: 80 (08/25 0444)  Labs: Recent Labs    04/19/24 1050 04/20/24 0624 04/21/24 0535  HGB 11.2* 9.8* 10.4*  HCT 34.8* 31.2* 31.2*  PLT 157 144* 136*  LABPROT 15.9* 16.3* 15.6*  INR 1.2 1.2 1.2  CREATININE 0.76 1.06 1.10  CKTOTAL 241  --   --     Estimated Creatinine Clearance: 43.6 mL/min (by C-G formula based on SCr of 1.1 mg/dL).   Medical History: Past Medical History:  Diagnosis Date   Aortic stenosis    Atrial fibrillation (HCC)    Baker cyst    Left knee   DJD (degenerative joint disease)    knees   Dysrhythmia    Eczema    Erectile dysfunction    Gout    History of hiatal hernia    History of radiation therapy    Right lung- 04/21/21-05/03/21 Dr. Lynwood Nasuti   HTN (hypertension)    MVP (mitral valve prolapse)    Shortness of breath dyspnea    with activity    Assessment: AC/Heme: Warfarin PTA resumed for afib - lower target of 2-2.5  - CHA?DS?-VASc Score of at least 5  - 5mg  Wed and 2.5mg  all other daysl - Hgb 10.4., Plts 136, INR 1.2 again today  Goal of Therapy:  INR 2-2.5 Monitor platelets by anticoagulation protocol: Yes   Plan:  Coumadin  5mg  po x 1 today. Daily INR  Lenola Lockner Karoline Marina, PharmD, BCPS Clinical Staff Pharmacist Marina Salines Stillinger 04/21/2024,7:45 AM

## 2024-04-21 NOTE — [place] FL2 (Signed)
 [place]  MEDICAID FL2 LEVEL OF CARE FORM     IDENTIFICATION  Patient Name: [name] Birthdate: 21-Apr-1936 Sex: male Admission Date (Current Location): 04/19/2024  [facility] and [place] Number:  Producer, television/film/video and Address:  [facility],  [address], [place] [address]      Provider Number: [id number]  Attending Physician Name and Address:  Sonjia Held, MD  Relative Name and Phone Number:  Jori Pool (Daughter)  [phone]    Current Level of Care: Hospital Recommended Level of Care: Skilled Nursing Facility Prior Approval Number:    Date Approved/Denied:   PASRR Number: [id number]  Discharge Plan: SNF    Current Diagnoses: Patient Active Problem List   Diagnosis Date Noted   Fall at home 04/19/2024   Permanent atrial fibrillation (HCC) 04/19/2024   Normocytic anemia 04/19/2024   Frequent PVCs 04/19/2024   Abrasion of both knees 04/19/2024   Hyperbilirubinemia 04/19/2024   Physical deconditioning 04/19/2024   Contusion of right knee 04/19/2024   Syncope 04/19/2024   Atherosclerosis of native arteries of extremity with intermittent claudication (HCC) 02/16/2024   Melena 09/20/2023   Acute blood loss anemia 09/20/2023   Symptomatic anemia 09/20/2023   GI bleed 09/19/2023   Hypersensitivity pneumonitis (HCC) 06/04/2023   Centrilobular emphysema (HCC) 06/04/2023   Elevated PSA 10/30/2022   Non-small cell lung cancer (HCC) 06/23/2022   Hemoptysis 03/07/2021   Pulmonary nodules 03/07/2021   Hypokalemia 09/24/2019   Vitamin D  deficiency 09/24/2019   Vitamin B12 deficiency 09/24/2019   Chronic diastolic CHF (congestive heart failure) (HCC) 09/17/2018   Nontraumatic epidural hematoma (HCC) 05/01/2017   Routine general medical examination at a health care facility 08/17/2015   Dyspnea 06/10/2015   History of total knee arthroplasty 04/14/2015   Spinal stenosis, lumbar region, with neurogenic claudication 01/15/2015   Encounter  for therapeutic drug monitoring 10/13/2013   S/P AVR (aortic valve replacement) 07/03/2013   Urticaria 11/27/2011   Dysphonia 11/07/2011   Anxiety 04/04/2011   CAROTID BRUIT 03/23/2009   Osteoarthritis 03/22/2009   Chronic atrial fibrillation (HCC) 03/16/2009   Aortic valve disorder 01/21/2008   ERECTILE DYSFUNCTION, 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   Gout 04/17/2007   Essential hypertension 04/17/2007    Orientation RESPIRATION BLADDER Height & Weight     Situation, Place, Time, Self  Normal Incontinent Weight: 164 lb 7.4 oz (74.6 kg) Height:  5' 3 (160 cm)  BEHAVIORAL SYMPTOMS/MOOD NEUROLOGICAL BOWEL NUTRITION STATUS      Incontinent Diet (see dc summary)  AMBULATORY STATUS COMMUNICATION OF NEEDS Skin   Limited Assist Verbally Normal                       Personal Care Assistance Level of Assistance  Bathing, Feeding, Dressing Bathing Assistance: Limited assistance Feeding assistance: Limited assistance Dressing Assistance: Limited assistance     Functional Limitations Info  Speech, Sight, Hearing Sight Info: Adequate Hearing Info: Adequate Speech Info: Adequate    SPECIAL CARE FACTORS FREQUENCY  PT (By licensed PT), OT (By licensed OT)     PT Frequency: 5x/wk OT Frequency: 5x/wk            Contractures Contractures Info: Not present    Additional Factors Info  Code Status, Allergies Code Status Info: Full code Allergies Info: NKA           Current Medications (04/21/2024):  This is the current hospital active medication list Current Facility-Administered Medications  Medication Dose Route Frequency Provider Last Rate Last Admin  acetaminophen  (TYLENOL ) tablet 650 mg  650 mg Oral Q6H PRN Celinda Alm Lot, MD       Or   acetaminophen  (TYLENOL ) suppository 650 mg  650 mg Rectal Q6H PRN Celinda Alm Lot, MD       allopurinol  (ZYLOPRIM ) tablet 300 mg  300 mg Oral Daily Celinda Alm Lot, MD   300 mg at 04/21/24 1040   benzonatate  (TESSALON ) capsule 100  mg  100 mg Oral TID PRN Pokhrel, Laxman, MD       cyanocobalamin  (VITAMIN B12) tablet 500 mcg  500 mcg Oral BID Celinda Alm Lot, MD   500 mcg at 04/21/24 1040   feeding supplement (ENSURE PLUS HIGH PROTEIN) liquid 237 mL  237 mL Oral BID BM Celinda Alm Lot, MD   237 mL at 04/21/24 1040   ipratropium-albuterol  (DUONEB) 0.5-2.5 (3) MG/3ML nebulizer solution 3 mL  3 mL Nebulization Q3H PRN Pokhrel, Laxman, MD       magnesium  oxide (MAG-OX) tablet 400 mg  400 mg Oral BID Pokhrel, Laxman, MD   400 mg at 04/21/24 1040   mupirocin  ointment (BACTROBAN ) 2 %   Topical BID Pokhrel, Laxman, MD   Given at 04/21/24 1040   ondansetron  (ZOFRAN ) tablet 4 mg  4 mg Oral Q6H PRN Celinda Alm Lot, MD       Or   ondansetron  (ZOFRAN ) injection 4 mg  4 mg Intravenous Q6H PRN Celinda Alm Lot, MD       oxyCODONE  (Oxy IR/ROXICODONE ) immediate release tablet 2.5 mg  2.5 mg Oral Q4H PRN Celinda Alm Lot, MD       potassium chloride  SA (KLOR-CON  M) CR tablet 20 mEq  20 mEq Oral q AM Celinda Alm Lot, MD   20 mEq at 04/21/24 9350   predniSONE  (DELTASONE ) tablet 20 mg  20 mg Oral Q breakfast Pokhrel, Laxman, MD   20 mg at 04/21/24 9177   sodium chloride  flush (NS) 0.9 % injection 3 mL  3 mL Intravenous Q12H Celinda Alm Lot, MD   3 mL at 04/21/24 9170   traZODone  (DESYREL ) tablet 50 mg  50 mg Oral QHS PRN Celinda Alm Lot, MD       warfarin (COUMADIN ) tablet 5 mg  5 mg Oral ONCE-1600 Casimir Camelia RAMAN, [facility]       Warfarin - Pharmacist Dosing Inpatient   Does not apply q1600 Louann Pen, RPH         Discharge Medications: Please see discharge summary for a list of discharge medications.  Relevant Imaging Results:  Relevant Lab Results:   Additional Information SSN [id number]  Sheri ONEIDA Sharps, LCSW

## 2024-04-21 NOTE — Plan of Care (Signed)

## 2024-04-21 NOTE — Progress Notes (Signed)
 PROGRESS NOTE  [name] FMW:[id number] DOB: 23-Mar-1936 DOA: 04/19/2024 PCP: Theophilus Andrews, Tully GRADE, MD   LOS: 0 days   Brief narrative:  [name] is a 88 y.o. male with past medical history significant of aortic stenosis, status post aortic valve replacement, paroxysmal atrial fibrillation, chronic diastolic heart failure, mitral valve prolapse, hypertension, centrilobular emphysema, gout,  non-small cell lung cancer,  spinal stenosis, vitamin B12 deficiency, vitamin D  deficiency w presented to the hospital after sustaining a fall 4 days prior to presentation.  Patient complains of pain and swelling and redness in the knee after the fall.  In the ED vitals were stable.  Labs were notable for normal CBC.  CK level of 241.  CMP showed mild hyponatremia with sodium of 133.  Imaging showed some prepatellar soft tissue swelling of the right knee.  Right elbow x-ray with severe DJD.  CT of the head without any acute abnormality.  EKG showed sinus tachycardia.  Patient was then considered for admission to the hospital for further evaluation and treatment.       Assessment/Plan: Principal Problem:   Fall at home Active Problems:   Gout   Essential hypertension   Chronic diastolic CHF (congestive heart failure) (HCC)   Vitamin B12 deficiency   Centrilobular emphysema (HCC)   Permanent atrial fibrillation (HCC)   Normocytic anemia   Frequent PVCs   Abrasion of both knees   Hyperbilirubinemia   Physical deconditioning   Contusion of right knee   Syncope   Fever temperature max of 100.4 F.  No further fever today.  No obvious infiltrate on chest x-ray.  Urinalysis was negative.  No further stools.  If further spike of fever might need blood cultures.  No indication for antibiotic at this time.    Syncope  Continue telemetry monitoring.  2 D Echo with preserved LF function.  Carotid duplex ultrasound with less than 39% stenosis with possible ulcerated plaque in the proximal  external carotid artery.     Chronic diastolic CHF (congestive heart failure) Compensated.  Echo with preserved LF function.  Hyponatremia. Sodium level of 129 today from initial 133.  Will hold off with Lasix .  Check levels in AM.     Permanent atrial fibrillation   Frequent PVCs CHA?DS?-VASc Score of at least 5.  On metoprolol .  Warfarin per pharmacy.  Rate controlled  Hypomagnesemia.  Magnesium  of 1.5 today.  Will replace with 2 g of IV magnesium  sulfate.  Add magnesium  oxide.  Check levels in AM.    Essential hypertension Amlodipine  on hold.  Continue monitor blood pressure     Normocytic anemia Monitor hematocrit and hemoglobin.  Latest hemoglobin of 10.4     Hyperbilirubinemia Continue to monitor LFTs.  Check LFTs in AM.     Vitamin B12 deficiency Continue B12 supplementation.     Centrilobular emphysema Continue bronchodilators.  Cough has improved.  X-ray done 04/20/2024 showed low lung volumes with coarse interstitial prominence.     Gout Continue allopurinol   Fall at home with abrasion of the knees and contusion of right knee Left leg and left upper extremity wound.   Physical deconditioning   PT OT evaluation recommend SNF.  Patient states by himself at home and has some assistance.  CT scan of the knee without any fracture and has osteoarthritis.  Seen by wound care for evaluation of left forearm and left leg wound.  Continue dressing.   DVT prophylaxis:  warfarin (COUMADIN ) tablet 5 mg   Disposition:  Physical therapy has recommended skilled nursing facility placement at this time.  Medically stable for disposition.  Status is: Observation  The patient will require care spanning > 2 midnights and should be moved to inpatient because: Need for rehabilitation.  Code Status:     Code Status: Full Code  Family Communication: Spoke with the patient's daughter on the phone on  04/20/2024 Consultants: None  Procedures: None  Anti-infectives:  None  Anti-infectives (From admission, onward)    None       Subjective: Today, patient was seen and examined at bedside.  Patient alert awake and Communicative.  Has no dyspnea or congestion.  Overall feels better. Objective: Vitals:   04/20/24 1957 04/21/24 0444  BP: 117/77 (!) 161/81  Pulse: 87 80  Resp: (!) 24 20  Temp: 98 F (36.7 C) (!) 97.5 F (36.4 C)  SpO2: 98% 99%    Intake/Output Summary (Last 24 hours) at 04/21/2024 0942 Last data filed at 04/20/2024 1005 Gross per 24 hour  Intake 240 ml  Output --  Net 240 ml   Filed Weights   04/19/24 0945 04/20/24 0500 04/21/24 0444  Weight: 72.7 kg 75.5 kg 74.6 kg   Body mass index is 29.13 kg/m.   Physical Exam:  GENERAL: Patient is alert awake and oriented.  Appears chronically ill elderly male on room air.   HENT: No scleral pallor or icterus. Pupils equally reactive to light. Oral mucosa is moist NECK: is supple, no gross swelling noted. CHEST: .  Diminished breath sounds bilaterally.   CVS: S1 and S2 heard, no murmur. Regular rate and rhythm.  ABDOMEN: Soft, non-tender, bowel sounds are present.  Umbilical hernia present EXTREMITIES: Left lower extremity with dressing, left upper extremity with dressing. CNS: Cranial nerves are intact. No focal motor deficits. SKIN: warm and dry, multiple bruises on the upper and lower extremities with scabs.  Data Review: I have personally reviewed the following laboratory data and studies,  CBC: Recent Labs  Lab 04/19/24 1050 04/20/24 0624 04/21/24 0535  WBC 10.0 8.2 9.0  NEUTROABS 8.3*  --   --   HGB 11.2* 9.8* 10.4*  HCT 34.8* 31.2* 31.2*  MCV 96.1 96.3 93.1  PLT 157 144* 136*   Basic Metabolic Panel: Recent Labs  Lab 04/19/24 1050 04/20/24 0624 04/21/24 0535  NA 133* 132* 129*  K 3.9 3.6 4.5  CL 95* 98 97*  CO2 24 24 24   GLUCOSE 80 101* 87  BUN 19 23 17   CREATININE 0.76 1.06  1.10  CALCIUM 8.6* 8.1* 8.9  MG 2.2  --  1.5*  PHOS 3.2  --   --    Liver Function Tests: Recent Labs  Lab 04/19/24 1050 04/20/24 0624  AST 32 23  ALT 14 15  ALKPHOS 42 39  BILITOT 3.0* 1.6*  PROT 6.4* 5.4*  ALBUMIN  2.9* 2.5*   No results for input(s): LIPASE, AMYLASE in the last 168 hours. No results for input(s): AMMONIA in the last 168 hours. Cardiac Enzymes: Recent Labs  Lab 04/19/24 1050  CKTOTAL 241   BNP (last 3 results) Recent Labs    06/25/23 1133  BNP 352.7*    ProBNP (last 3 results) No results for input(s): PROBNP in the last 8760 hours.  CBG: Recent Labs  Lab 04/20/24 0500 04/20/24 0846 04/21/24 0615  GLUCAP 118* 98 124*   No results found for this or any previous visit (from the past 240 hours).   Studies: VAS US  CAROTID Result Date: 04/20/2024 Carotid  Arterial Duplex Study Patient Name:  [name]  Date of Exam:   04/20/2024 Medical Rec #: [id number]        Accession #:    [id number] Date of Birth: 08/21/1936         Patient Gender: M Patient Age:   60 years Exam Location:  [facility] Procedure:      VAS US  CAROTID Referring Phys: DAVID ORTIZ --------------------------------------------------------------------------------  Indications:  Syncope. Risk Factors: Hypertension, past history of smoking. Limitations   Today's exam was limited due to constant coughing, kept asking if               we can take a break. Performing Technologist: Elmarie Lindau, RVT  Examination Guidelines: A complete evaluation includes B-mode imaging, spectral Doppler, color Doppler, and power Doppler as needed of all accessible portions of each vessel. Bilateral testing is considered an integral part of a complete examination. Limited examinations for reoccurring indications may be performed as noted.  Right Carotid Findings: +----------+--------+--------+--------+--------------------+-------------------+           PSV cm/sEDV cm/sStenosisPlaque  Description  Comments            +----------+--------+--------+--------+--------------------+-------------------+ CCA Prox  71      11                                                      +----------+--------+--------+--------+--------------------+-------------------+ CCA Distal                        irregular and                                                             heterogenous                            +----------+--------+--------+--------+--------------------+-------------------+ ICA Prox  63      15      1-39%   irregular,                                                                heterogenous and                                                          calcific                                +----------+--------+--------+--------+--------------------+-------------------+ ICA Distal59      15                                                      +----------+--------+--------+--------+--------------------+-------------------+  ECA       63                                          possible ulcerative                                                       plaque              +----------+--------+--------+--------+--------------------+-------------------+ +----------+--------+-------+----------------+-------------------+           PSV cm/sEDV cmsDescribe        Arm Pressure (mmHG) +----------+--------+-------+----------------+-------------------+ Subclavian110            Multiphasic, WNL                    +----------+--------+-------+----------------+-------------------+ +---------+--------+--+--------+--+---------+ VertebralPSV cm/s52EDV cm/s14Antegrade +---------+--------+--+--------+--+---------+  Left Carotid Findings: +----------+--------+--------+--------+--------------------------+--------+           PSV cm/sEDV cm/sStenosisPlaque Description        Comments  +----------+--------+--------+--------+--------------------------+--------+ CCA Prox  66      10                                                 +----------+--------+--------+--------+--------------------------+--------+ CCA Mid   133     20                                                 +----------+--------+--------+--------+--------------------------+--------+ CCA Distal76      13              irregular and heterogenous         +----------+--------+--------+--------+--------------------------+--------+ ICA Prox  69      14              irregular and heterogenous         +----------+--------+--------+--------+--------------------------+--------+ ICA Distal88      21                                                 +----------+--------+--------+--------+--------------------------+--------+ ECA       87                                                         +----------+--------+--------+--------+--------------------------+--------+ +----------+--------+--------+----------------+-------------------+           PSV cm/sEDV cm/sDescribe        Arm Pressure (mmHG) +----------+--------+--------+----------------+-------------------+ [id number]             Multiphasic, WNL                    +----------+--------+--------+----------------+-------------------+ +---------+--------+--+--------+--+---------+ VertebralPSV cm/s51EDV cm/s13Antegrade +---------+--------+--+--------+--+---------+   Summary: Right Carotid: Velocities in the right ICA are consistent with a  1-39% stenosis.                There is possible ulcerative plaque at proximal external carotid                artery. Left Carotid: Velocities in the left ICA are consistent with a 1-39% stenosis. Vertebrals:  Bilateral vertebral arteries demonstrate antegrade flow. Subclavians: Normal flow hemodynamics were seen in bilateral subclavian              arteries. *See table(s) above for measurements and  observations.  Electronically signed by Eather Popp MD on 04/20/2024 at 5:35:45 PM.    Final    DG CHEST PORT 1 VIEW Result Date: 04/20/2024 CLINICAL DATA:  Fever. EXAM: PORTABLE CHEST 1 VIEW COMPARISON:  09/19/2023. FINDINGS: Cardiomegaly, unchanged. Prior median sternotomy and aortic valve replacement. Aortic atherosclerosis. Low lung volumes with central interstitial prominence, which could reflect bronchovascular crowding secondary to hypoinflation, atypical infection, or interstitial pulmonary edema. Retrocardiac opacity may reflect atelectasis or infiltrate. Suspected small bilateral pleural effusions. Redemonstrated postsurgical and post treatment changes at the right lung apex. No pneumothorax. Redemonstrated high-riding right humeral head, likely secondary to chronic rotator cuff tear. No acute osseous abnormality. IMPRESSION: 1. Low lung volumes with central interstitial prominence, which could reflect bronchovascular crowding secondary to hypoinflation, atypical infection, or interstitial pulmonary edema. 2. Retrocardiac opacity may reflect atelectasis or infiltrate. 3. Suspected small bilateral pleural effusions. 4. Cardiomegaly. Electronically Signed   By: Harrietta Sherry M.D.   On: 04/20/2024 13:35   ECHOCARDIOGRAM COMPLETE Result Date: 04/20/2024    ECHOCARDIOGRAM REPORT   Patient Name:   [name] Date of Exam: 04/20/2024 Medical Rec #:  [id number]       Height:       63.0 in Accession #:    [id number]      Weight:       166.4 lb Date of Birth:  07/09/1936        BSA:          1.788 m Patient Age:    88 years        BP:           128/78 mmHg Patient Gender: M               HR:           112 bpm. Exam Location:  Inpatient Procedure: 2D Echo, Cardiac Doppler and Color Doppler (Both Spectral and Color            Flow Doppler were utilized during procedure). Indications:    Syncope R55  History:        Patient has prior history of Echocardiogram examinations, most                 recent  01/19/2023. Aortic Valve Disease; Risk                 Factors:Hypertension.                 Aortic Valve: bioprosthetic valve is present in the aortic                 position.  Sonographer:    Jayson Gaskins Referring Phys: [id number] DAVID MANUEL ORTIZ IMPRESSIONS  1. Left ventricular ejection fraction, by estimation, is 50 to 55%. The left ventricle has low normal function. The left ventricle has no regional wall motion abnormalities. There is moderate left ventricular hypertrophy. Left ventricular diastolic parameters are indeterminate.  2.  Right ventricular systolic function is normal. The right ventricular size is normal. There is moderately elevated pulmonary artery systolic pressure.  3. Left atrial size was moderately dilated.  4. Right atrial size was moderately dilated.  5. The mitral valve is abnormal. Mild mitral valve regurgitation. Moderate mitral annular calcification.  6. Tricuspid valve regurgitation is moderate.  7. Biprothetic AVR stable mean gradient mild AR no PVL no details in Epic on size/type of valve. The aortic valve has been repaired/replaced. Aortic valve regurgitation is mild. There is a bioprosthetic valve present in the aortic position.  8. The inferior vena cava is dilated in size with >50% respiratory variability, suggesting right atrial pressure of 8 mmHg. FINDINGS  Left Ventricle: Left ventricular ejection fraction, by estimation, is 50 to 55%. The left ventricle has low normal function. The left ventricle has no regional wall motion abnormalities. Strain was performed and the global longitudinal strain is indeterminate. The left ventricular internal cavity size was normal in size. There is moderate left ventricular hypertrophy. Left ventricular diastolic parameters are indeterminate. Right Ventricle: The right ventricular size is normal. No increase in right ventricular wall thickness. Right ventricular systolic function is normal. There is moderately elevated pulmonary artery  systolic pressure. The tricuspid regurgitant velocity is 3.45 m/s, and with an assumed right atrial pressure of 8 mmHg, the estimated right ventricular systolic pressure is 55.6 mmHg. Left Atrium: Left atrial size was moderately dilated. Right Atrium: Right atrial size was moderately dilated. Pericardium: There is no evidence of pericardial effusion. Mitral Valve: The mitral valve is abnormal. There is moderate thickening of the mitral valve leaflet(s). There is mild calcification of the mitral valve leaflet(s). Moderate mitral annular calcification. Mild mitral valve regurgitation. MV peak gradient,  6.9 mmHg. The mean mitral valve gradient is 3.0 mmHg. Tricuspid Valve: The tricuspid valve is grossly normal. Tricuspid valve regurgitation is moderate. Aortic Valve: Biprothetic AVR stable mean gradient mild AR no PVL no details in Epic on size/type of valve. The aortic valve has been repaired/replaced. Aortic valve regurgitation is mild. Aortic regurgitation PHT measures 787 msec. Aortic valve mean gradient measures 16.0 mmHg. Aortic valve peak gradient measures 29.7 mmHg. Aortic valve area, by VTI measures 1.42 cm. There is a bioprosthetic valve present in the aortic position. Pulmonic Valve: The pulmonic valve was grossly normal. Pulmonic valve regurgitation is trivial. Aorta: The aortic root is normal in size and structure. Venous: The inferior vena cava is dilated in size with greater than 50% respiratory variability, suggesting right atrial pressure of 8 mmHg. IAS/Shunts: No atrial level shunt detected by color flow Doppler. Additional Comments: 3D was performed not requiring image post processing on an independent workstation and was indeterminate.  LEFT VENTRICLE PLAX 2D LVIDd:         4.60 cm LVIDs:         3.50 cm LV PW:         1.45 cm LV IVS:        1.55 cm LVOT diam:     1.90 cm LV SV:         66 LV SV Index:   37 LVOT Area:     2.84 cm  RIGHT VENTRICLE RV S prime:     9.25 cm/s TAPSE (M-mode): 1.5 cm  LEFT ATRIUM              Index        RIGHT ATRIUM           Index LA Vol (A2C):  127.0 ml 71.01 ml/m  RA Area:     30.95 cm LA Vol (A4C):   88.2 ml  49.32 ml/m  RA Volume:   103.50 ml 57.87 ml/m LA Biplane Vol: 107.0 ml 59.83 ml/m  AORTIC VALVE AV Area (Vmax):    1.25 cm AV Area (Vmean):   1.20 cm AV Area (VTI):     1.42 cm AV Vmax:           272.50 cm/s AV Vmean:          193.000 cm/s AV VTI:            0.462 m AV Peak Grad:      29.7 mmHg AV Mean Grad:      16.0 mmHg LVOT Vmax:         120.00 cm/s LVOT Vmean:        81.400 cm/s LVOT VTI:          0.232 m LVOT/AV VTI ratio: 0.50 AI PHT:            787 msec  AORTA Ao Root diam: 3.10 cm MITRAL VALVE                TRICUSPID VALVE MV Area (PHT): 3.50 cm     TR Peak grad:   47.6 mmHg MV Area VTI:   3.45 cm     TR Vmax:        345.00 cm/s MV Peak grad:  6.9 mmHg MV Mean grad:  3.0 mmHg     SHUNTS MV Vmax:       1.31 m/s     Systemic VTI:  0.23 m MV Vmean:      77.8 cm/s    Systemic Diam: 1.90 cm MV Decel Time: 217 msec MV E velocity: 128.00 cm/s Maude Emmer MD Electronically signed by Maude Emmer MD Signature Date/Time: 04/20/2024/10:30:45 AM    Final    CT Knee Right Wo Contrast Result Date: 04/19/2024 CLINICAL DATA:  Pain and inability to bear weight on the right knee after recent fall. EXAM: CT OF THE RIGHT KNEE WITHOUT CONTRAST TECHNIQUE: Multidetector CT imaging of the right knee was performed according to the standard protocol. Multiplanar CT image reconstructions were also generated. RADIATION DOSE REDUCTION: This exam was performed according to the departmental dose-optimization program which includes automated exposure control, adjustment of the mA and/or kV according to patient size and/or use of iterative reconstruction technique. COMPARISON:  Right knee radiographs dated 04/19/2024. FINDINGS: Motion degradation slightly limits the sensitivity of this exam. Bones/Joint/Cartilage No acute fracture or dislocation. Severe medial femorotibial  compartment joint space narrowing resulting in bone-on-bone contact, subchondral sclerosis, and marginal osteophytosis. Moderate patellofemoral compartment joint space narrowing and osteophytosis. Foci of mineralization in the medial and lateral compartments, may be dystrophic or reflect chondrocalcinosis. Moderate knee joint effusion. No evidence of lipohemarthrosis. Patellar enthesopathy. Ligaments Ligaments are suboptimally evaluated by CT. Muscles and Tendons Muscles are within normal limits for age. Small Baker's cyst. Visualized patellar and quadriceps tendons appear grossly intact. Soft tissue Mild subcutaneous edema anteriorly.  No loculated fluid collection. IMPRESSION: 1. No acute fracture identified.  No dislocation. 2. Moderate to severe tricompartmental osteoarthritis of the right knee, most pronounced in the medial femorotibial compartment resulting in bone-on-bone contact. 3. Moderate-sized knee joint effusion. No evidence of lipohemarthrosis. Electronically Signed   By: Harrietta Sherry M.D.   On: 04/19/2024 12:48   DG Elbow Complete Left Result Date: 04/19/2024 CLINICAL DATA:  88 year old male status post fall earlier  this week. Unable to weightbear. EXAM: LEFT ELBOW - COMPLETE 3+ VIEW COMPARISON:  None Available. FINDINGS: Four views. Severe left elbow joint degeneration. Joint space loss, subchondral sclerosis, osteophytosis. Bulky heterotopic calcification or ossification of the adjacent soft tissues. No definite joint effusion. No acute fracture or dislocation identified. Abnormal and asymmetric soft tissue contours about the left elbow and visible forearm (image #1). No soft tissue gas identified. Calcified peripheral vascular disease. IMPRESSION: 1. Possible soft tissue hematoma about the left elbow and in the forearm. 2. Severe left elbow joint degeneration. No acute fracture or dislocation identified. 3. Calcified peripheral vascular disease. Electronically Signed   By: VEAR Hurst M.D.   On:  04/19/2024 11:17   DG Elbow Complete Right Result Date: 04/19/2024 CLINICAL DATA:  88 year old male status post fall earlier this week. Unable to weightbear. EXAM: RIGHT ELBOW - COMPLETE 3+ VIEW COMPARISON:  None Available. FINDINGS: Four views. Severe degeneration throughout the right elbow. Joint space loss, subchondral sclerosis, osteophytosis, and bulky heterotopic calcification or ossification. This includes a large 2 cm antecubital fossa calcification. No obvious joint effusion. No superimposed acute fracture or dislocation identified. Calcified peripheral vascular disease. IMPRESSION: 1. Severe right elbow degeneration. Bulky heterotopic calcification or ossification. No acute fracture or dislocation identified. 2. Calcified peripheral vascular disease. Electronically Signed   By: VEAR Hurst M.D.   On: 04/19/2024 11:15   DG Knee Complete 4 Views Right Result Date: 04/19/2024 CLINICAL DATA:  88 year old male status post fall earlier this week. Unable to weightbear. EXAM: RIGHT KNEE - COMPLETE 4+ VIEW COMPARISON:  Knee series 03/15/2022. FINDINGS: Four views including cross-table lateral. Chronic severe tricompartmental degenerative spurring, severe chronic medial right knee joint space loss. Medial and lateral compartment dystrophic calcification. Suprapatellar soft tissue swelling which could be joint effusion or hematoma. Patella appears intact. No acute osseous abnormality identified. Calcified peripheral vascular disease. IMPRESSION: 1. Suprapatellar soft tissue swelling could be joint effusion or hematoma. 2. Chronic severe right knee joint degeneration. No acute fracture or dislocation identified. Electronically Signed   By: VEAR Hurst M.D.   On: 04/19/2024 11:13   CT Head Wo Contrast Result Date: 04/19/2024 CLINICAL DATA:  88 year old male status post fall earlier this week. Unable to weightbear. EXAM: CT HEAD WITHOUT CONTRAST TECHNIQUE: Contiguous axial images were obtained from the base of the  skull through the vertex without intravenous contrast. RADIATION DOSE REDUCTION: This exam was performed according to the departmental dose-optimization program which includes automated exposure control, adjustment of the mA and/or kV according to patient size and/or use of iterative reconstruction technique. COMPARISON:  Head CT 01/13/2024. FINDINGS: Brain: Stable cerebral volume. No midline shift, ventriculomegaly, mass effect, evidence of mass lesion, intracranial hemorrhage or evidence of cortically based acute infarction. Chronic dural calcification incidentally noted. Patchy and confluent moderate bilateral cerebral white matter hypodensity. Chronic deep white matter capsule involvement. Stable gray-white matter differentiation throughout the brain. Vascular: Calcified atherosclerosis at the skull base. No suspicious intracranial vascular hyperdensity. Skull: Appears stable and intact. No acute osseous abnormality identified. Sinuses/Orbits: Visualized paranasal sinuses and mastoids are clear. Other: Calcified scalp vessel atherosclerosis. Stable orbit and scalp soft tissues. IMPRESSION: 1. No acute intracranial abnormality or recent traumatic injury identified. 2. Stable non contrast CT appearance of chronic small vessel disease. 3. Calcified scalp vessel atherosclerosis which can be seen with end stage renal disease. Electronically Signed   By: VEAR Hurst M.D.   On: 04/19/2024 11:12     Vernal Alstrom, MD  [facility] 04/21/2024  If  7PM-7AM, please contact night-coverage

## 2024-04-22 ENCOUNTER — Ambulatory Visit: Admitting: Internal Medicine

## 2024-04-22 DIAGNOSIS — W19XXXA Unspecified fall, initial encounter: Secondary | ICD-10-CM | POA: Diagnosis not present

## 2024-04-22 DIAGNOSIS — S80212A Abrasion, left knee, initial encounter: Secondary | ICD-10-CM | POA: Diagnosis not present

## 2024-04-22 DIAGNOSIS — Y92009 Unspecified place in unspecified non-institutional (private) residence as the place of occurrence of the external cause: Secondary | ICD-10-CM | POA: Diagnosis not present

## 2024-04-22 LAB — COMPREHENSIVE METABOLIC PANEL WITH GFR
ALT: 53 U/L — ABNORMAL HIGH (ref 0–44)
AST: 73 U/L — ABNORMAL HIGH (ref 15–41)
Albumin: 2.3 g/dL — ABNORMAL LOW (ref 3.5–5.0)
Alkaline Phosphatase: 47 U/L (ref 38–126)
Anion gap: 8 (ref 5–15)
BUN: 32 mg/dL — ABNORMAL HIGH (ref 8–23)
CO2: 27 mmol/L (ref 22–32)
Calcium: 8.1 mg/dL — ABNORMAL LOW (ref 8.9–10.3)
Chloride: 100 mmol/L (ref 98–111)
Creatinine, Ser: 0.82 mg/dL (ref 0.61–1.24)
GFR, Estimated: >60 mL/min (ref >60)
Glucose, Bld: 110 mg/dL — ABNORMAL HIGH (ref 70–99)
Potassium: 3.4 mmol/L — ABNORMAL LOW (ref 3.5–5.1)
Sodium: 135 mmol/L (ref 135–145)
Total Bilirubin: 0.6 mg/dL (ref 0.0–1.2)
Total Protein: 5.3 g/dL — ABNORMAL LOW (ref 6.5–8.1)

## 2024-04-22 LAB — CBC
HCT: 31.1 % — ABNORMAL LOW (ref 39.0–52.0)
Hemoglobin: 9.7 g/dL — ABNORMAL LOW (ref 13.0–17.0)
MCH: 30.5 pg (ref 26.0–34.0)
MCHC: 31.2 g/dL (ref 30.0–36.0)
MCV: 97.8 fL (ref 80.0–100.0)
Platelets: 178 K/uL (ref 150–400)
RBC: 3.18 MIL/uL — ABNORMAL LOW (ref 4.22–5.81)
RDW: 14.6 % (ref 11.5–15.5)
WBC: 6.8 K/uL (ref 4.0–10.5)
nRBC: 0 % (ref 0.0–0.2)

## 2024-04-22 LAB — PROTIME-INR
INR: 1.4 — ABNORMAL HIGH (ref 0.8–1.2)
Prothrombin Time: 17.7 s — ABNORMAL HIGH (ref 11.4–15.2)

## 2024-04-22 LAB — GLUCOSE, CAPILLARY: Glucose-Capillary: 122 mg/dL — ABNORMAL HIGH (ref 70–99)

## 2024-04-22 LAB — MAGNESIUM: Magnesium: 2.4 mg/dL (ref 1.7–2.4)

## 2024-04-22 MED ORDER — WARFARIN SODIUM 2.5 MG PO TABS
2.5000 mg | ORAL_TABLET | Freq: Once | ORAL | Status: AC
  Administered 2024-04-22: 2.5 mg via ORAL
  Filled 2024-04-22: qty 1

## 2024-04-22 MED ORDER — AMLODIPINE BESYLATE 5 MG PO TABS
5.0000 mg | ORAL_TABLET | Freq: Every day | ORAL | Status: DC
  Administered 2024-04-22 – 2024-04-23 (×2): 5 mg via ORAL
  Filled 2024-04-22 (×2): qty 1

## 2024-04-22 MED ORDER — POTASSIUM CHLORIDE CRYS ER 20 MEQ PO TBCR
40.0000 meq | EXTENDED_RELEASE_TABLET | Freq: Every day | ORAL | Status: DC
  Administered 2024-04-22 – 2024-04-23 (×2): 40 meq via ORAL
  Filled 2024-04-22 (×2): qty 2

## 2024-04-22 NOTE — Plan of Care (Signed)

## 2024-04-22 NOTE — Progress Notes (Signed)
 Physical Therapy Treatment Patient Details Name: [name] MRN: [id number] DOB: [birth year] Today's Date: 04/22/2024   History of Present Illness 88 y.o. male admitted on 04/19/24 for fall at home with abrasion of the knees and contusion of right knee.  Past medical history significant of aortic stenosis, gout, hypertension, mitral valve prolapse, aortic valve replacement, with a pericardial tissue valve, back surgery, paroxysmal atrial fibrillation, chronic diastolic heart failure, centrilobular emphysema,  non-small cell lung cancer,  spinal stenosis, vitamin B12 deficiency, vitamin D  deficiency    PT Comments  Pt and family agreeable to working with therapy. Pt a bit concerned/not confident about attempting ambulation initially, but with some encouragement and explanation, pt very willing to try. Pt put forth very good effort and participated well during session. Multiple attempts to stand safely from low recliner. Mod A to stand and take a few steps forward with RW (with very close following of recliner). Distance limited by pain (discussed possibly considering pre-medication with pain meds prior to PT sessions to improve tolerance) and fatigue on today. Good improvement from last PT session-pt is progressing well. Patient will benefit from continued inpatient follow up therapy, <3 hours/day-pt/family agreeable.   If plan is discharge home, recommend the following: A lot of help with walking and/or transfers;A lot of help with bathing/dressing/bathroom;Assistance with cooking/housework;Assist for transportation;Help with stairs or ramp for entrance   Can travel by private vehicle        Equipment Recommendations       Recommendations for Other Services       Precautions / Restrictions Precautions Precautions: Fall Restrictions Weight Bearing Restrictions Per Provider Order: No     Mobility  Bed Mobility               General bed mobility comments: oob in recliner     Transfers Overall transfer level: Needs assistance Equipment used: Rolling walker (2 wheels) Transfers: Sit to/from Stand Sit to Stand: Mod assist           General transfer comment: Multiple attempts to get to standing. On 2nd attempt, pt able to fully rise. Assist to shift weight anteriorly, power up, stabilize, control descent. Cues for safety, technique, hand/feet placement. Increased time and effort for pt.    Ambulation/Gait Ambulation/Gait assistance: Min assist, +2 safety/equipment Gait Distance (Feet): 2 Feet Assistive device: Rolling walker (2 wheels) Gait Pattern/deviations: Step-to pattern, Antalgic, Decreased stance time - right, Decreased weight shift to right       General Gait Details: Cues for safety, technique, sequencing, proper use of RW. Assist to stabilize and follow closely with recliner. Remains at high risk for falls. HR 87 bpm, O2 96% on RA, dyspnea 2/4. Distance limited by pain and fatigue.   Stairs             Wheelchair Mobility     Tilt Bed    Modified Rankin (Stroke Patients Only)       Balance Overall balance assessment: Needs assistance, History of Falls         Standing balance support: Bilateral upper extremity supported, During functional activity, Reliant on assistive device for balance Standing balance-Leahy Scale: Poor                              Communication Communication Communication: No apparent difficulties  Cognition Arousal: Alert Behavior During Therapy: WFL for tasks assessed/performed   PT - Cognitive impairments: No apparent impairments  Following commands: Intact      Cueing Cueing Techniques: Verbal cues  Exercises Total Joint Exercises Ankle Circles/Pumps: AROM Long Arc Quad: AROM, Right (pt did a few reps with limited range.)    General Comments        Pertinent Vitals/Pain Pain Assessment Pain Assessment: Faces Faces Pain Scale: Hurts  whole lot Pain Location: R knee with activity/WBing Pain Descriptors / Indicators: Grimacing, Guarding, Sharp Pain Intervention(s): Limited activity within patient's tolerance, Monitored during session, Repositioned    Home Living                          Prior Function            PT Goals (current goals can now be found in the care plan section) Progress towards PT goals: Progressing toward goals    Frequency    Min 3X/week      PT Plan      Co-evaluation              AM-PAC PT 6 Clicks Mobility   Outcome Measure  Help needed turning from your back to your side while in a flat bed without using bedrails?: A Lot Help needed moving from lying on your back to sitting on the side of a flat bed without using bedrails?: A Lot Help needed moving to and from a bed to a chair (including a wheelchair)?: A Lot Help needed standing up from a chair using your arms (e.g., wheelchair or bedside chair)?: A Lot Help needed to walk in hospital room?: A Lot Help needed climbing 3-5 steps with a railing? : Total 6 Click Score: 11    End of Session Equipment Utilized During Treatment: Gait belt Activity Tolerance: Patient limited by fatigue;Patient limited by pain Patient left: in chair;with call bell/phone within reach;with family/visitor present   PT Visit Diagnosis: Difficulty in walking, not elsewhere classified (R26.2);Muscle weakness (generalized) (M62.81);Repeated falls (R29.6)     Time: 8975-8960 PT Time Calculation (min) (ACUTE ONLY): 15 min  Charges:    $Gait Training: 8-22 mins PT General Charges $$ ACUTE PT VISIT: 1 Visit                         Dannial SQUIBB, PT Acute Rehabilitation  Office: [phone]

## 2024-04-22 NOTE — Progress Notes (Signed)
 PROGRESS NOTE  MAKSYMILIAN MABEY FMW:[id number] DOB: Jan 06, 1936 DOA: 04/19/2024 PCP: Theophilus Andrews, Tully GRADE, MD   LOS: 0 days   Brief narrative:  [name] is a 88 y.o. male with past medical history significant of aortic stenosis, status post aortic valve replacement, paroxysmal atrial fibrillation, chronic diastolic heart failure, mitral valve prolapse, hypertension, centrilobular emphysema, gout,  non-small cell lung cancer,  spinal stenosis, vitamin B12 deficiency, vitamin D  deficiency w presented to the hospital after sustaining a fall 4 days prior to presentation.  Patient complains of pain and swelling and redness in the knee after the fall.  In the ED, vitals were stable.  Labs were notable for normal CBC.  CK level of 241.  CMP showed mild hyponatremia with sodium of 133.  Imaging showed some prepatellar soft tissue swelling of the right knee.  Right elbow x-ray with severe DJD.  CT of the head without any acute abnormality.  EKG showed sinus tachycardia.  Patient was then considered for admission to the hospital for further evaluation and treatment.  At this time patient has remained stable and is awaiting for skilled nursing facility placement.     Assessment/Plan: Principal Problem:   Fall at home Active Problems:   Gout   Essential hypertension   Chronic diastolic CHF (congestive heart failure) (HCC)   Vitamin B12 deficiency   Centrilobular emphysema (HCC)   Permanent atrial fibrillation (HCC)   Normocytic anemia   Frequent PVCs   Abrasion of both knees   Hyperbilirubinemia   Physical deconditioning   Contusion of right knee   Syncope   Fever  1 episode of uncertain etiology, chest x-ray UA was negative.  No further fever.    Syncope  Continue telemetry monitoring.  2 D Echo with preserved LF function.  Carotid duplex ultrasound with less than 39% stenosis with possible ulcerated plaque in the proximal external carotid artery.  Will need physical therapy on  discharge.     Chronic diastolic CHF (congestive heart failure) Compensated.  2D echo with preserved LF function.  Hyponatremia. Sodium level of 135 from 129 after holding Lasix .  Will hold Lasix  for 1 more day.  Mild hypokalemia.  Potassium of 3.4 today.  Will replace orally.  As of 2.4     Permanent atrial fibrillation   Frequent PVCs CHA?DS?-VASc Score of at least 5.  On metoprolol .  Warfarin as per pharmacy.  Rate controlled.  Will need to revise anticoagulation in the context of falls but patient has high risk for stroke.  Hypomagnesemia.  Improved after replacement with IV and oral magnesium . Will continue magnesium  oxide.    Essential hypertension Will resume amlodipine .     Normocytic anemia Monitor hematocrit and hemoglobin.  Latest hemoglobin of 10.4     Hyperbilirubinemia Continue to monitor LFTs.  Check LFTs in AM.     Vitamin B12 deficiency Continue B12 supplementation.     Centrilobular emphysema Continue bronchodilators.    X-ray done 04/20/2024 showed low lung volumes with coarse interstitial prominence.  Cough has improved     Gout Continue allopurinol   Fall at home with abrasion of the knees and contusion of right knee Left leg and left upper extremity wound.   Physical deconditioning   PT OT evaluation recommend SNF.  Patient states by himself at home and has some assistance.  CT scan of the knee without any fracture and has osteoarthritis.  Seen by wound care for evaluation of left forearm and left leg wound.  Continue dressing.  DVT prophylaxis:  warfarin (COUMADIN ) tablet 2.5 mg   Disposition: Physical therapy has recommended skilled nursing facility placement at this time.  Medically stable for disposition.  Status is: Observation  The patient will require care spanning > 2 midnights and should be moved to inpatient because: Need for rehabilitation.  Code Status:     Code Status: Full Code  Family Communication: Spoke with the patient's  daughter on the phone at bedside on 04/22/2024   consultants: None  Procedures: None  Anti-infectives:  None  Anti-infectives (From admission, onward)    None       Subjective: Today, patient was seen and examined at bedside.  Patient alert awake and Communicative.  Feels okay.  Denies any nausea vomiting fever chills or rigor.  Patient's daughter at bedside.  Objective: Vitals:   04/21/24 2028 04/22/24 0518  BP: (!) 148/73 124/67  Pulse: 95 71  Resp: 18 18  Temp: 97.9 F (36.6 C) 97.6 F (36.4 C)  SpO2: 97% 99%   No intake or output data in the 24 hours ending 04/22/24 0933  Filed Weights   04/20/24 0500 04/21/24 0444 04/22/24 0518  Weight: 75.5 kg 74.6 kg 71.6 kg   Body mass index is 27.96 kg/m.   Physical Exam:  General:  Average built, not in obvious distress, appears chronically ill, elderly male, Communicative. HENT:   No scleral pallor or icterus noted. Oral mucosa is moist.  Chest: .  Diminished breath sounds bilaterally.  CVS: S1 &S2 heard. No murmur.  Regular rate and rhythm. Abdomen: Soft, nontender, nondistended.  Bowel sounds are heard.   Extremities: No cyanosis, clubbing, left lower extremity and upper extremity with dressing.  Peripheral pulses are palpable. Psych: Alert, awake and oriented,  CNS:  No cranial nerve deficits.  Moves all extremities. Skin: Warm and dry.  Multiple bruises in the skin with scabs.   Data Review: I have personally reviewed the following laboratory data and studies,  CBC: Recent Labs  Lab 04/19/24 1050 04/20/24 0624 04/21/24 0535 04/22/24 0543  WBC 10.0 8.2 9.0 6.8  NEUTROABS 8.3*  --   --   --   HGB 11.2* 9.8* 10.4* 9.7*  HCT 34.8* 31.2* 31.2* 31.1*  MCV 96.1 96.3 93.1 97.8  PLT 157 144* 136* 178   Basic Metabolic Panel: Recent Labs  Lab 04/19/24 1050 04/20/24 0624 04/21/24 0535 04/22/24 0543  NA 133* 132* 129* 135  K 3.9 3.6 4.5 3.4*  CL 95* 98 97* 100  CO2 24 24 24 27   GLUCOSE 80 101* 87  110*  BUN 19 23 17  32*  CREATININE 0.76 1.06 1.10 0.82  CALCIUM 8.6* 8.1* 8.9 8.1*  MG 2.2  --  1.5* 2.4  PHOS 3.2  --   --   --    Liver Function Tests: Recent Labs  Lab 04/19/24 1050 04/20/24 0624 04/22/24 0543  AST 32 23 73*  ALT 14 15 53*  ALKPHOS 42 39 47  BILITOT 3.0* 1.6* 0.6  PROT 6.4* 5.4* 5.3*  ALBUMIN  2.9* 2.5* 2.3*   No results for input(s): LIPASE, AMYLASE in the last 168 hours. No results for input(s): AMMONIA in the last 168 hours. Cardiac Enzymes: Recent Labs  Lab 04/19/24 1050  CKTOTAL 241   BNP (last 3 results) Recent Labs    06/25/23 1133  BNP 352.7*    ProBNP (last 3 results) No results for input(s): PROBNP in the last 8760 hours.  CBG: Recent Labs  Lab 04/20/24 0500 04/20/24 0846 04/21/24  0615 04/22/24 0543  GLUCAP 118* 98 124* 122*   No results found for this or any previous visit (from the past 240 hours).   Studies: VAS US  CAROTID Result Date: 04/20/2024 Carotid Arterial Duplex Study Patient Name:  LYNX GOODRICH  Date of Exam:   04/20/2024 Medical Rec #: [id number]        Accession #:    [id number] Date of Birth: December 05, 1935         Patient Gender: M Patient Age:   57 years Exam Location:  [facility] Procedure:      VAS US  CAROTID Referring Phys: DAVID ORTIZ --------------------------------------------------------------------------------  Indications:  Syncope. Risk Factors: Hypertension, past history of smoking. Limitations   Today's exam was limited due to constant coughing, kept asking if               we can take a break. Performing Technologist: Elmarie Lindau, RVT  Examination Guidelines: A complete evaluation includes B-mode imaging, spectral Doppler, color Doppler, and power Doppler as needed of all accessible portions of each vessel. Bilateral testing is considered an integral part of a complete examination. Limited examinations for reoccurring indications may be performed as noted.  Right Carotid Findings:  +----------+--------+--------+--------+--------------------+-------------------+           PSV cm/sEDV cm/sStenosisPlaque Description  Comments            +----------+--------+--------+--------+--------------------+-------------------+ CCA Prox  71      11                                                      +----------+--------+--------+--------+--------------------+-------------------+ CCA Distal                        irregular and                                                             heterogenous                            +----------+--------+--------+--------+--------------------+-------------------+ ICA Prox  63      15      1-39%   irregular,                                                                heterogenous and                                                          calcific                                +----------+--------+--------+--------+--------------------+-------------------+ ICA Distal59      15                                                      +----------+--------+--------+--------+--------------------+-------------------+  ECA       63                                          possible ulcerative                                                       plaque              +----------+--------+--------+--------+--------------------+-------------------+ +----------+--------+-------+----------------+-------------------+           PSV cm/sEDV cmsDescribe        Arm Pressure (mmHG) +----------+--------+-------+----------------+-------------------+ Subclavian110            Multiphasic, WNL                    +----------+--------+-------+----------------+-------------------+ +---------+--------+--+--------+--+---------+ VertebralPSV cm/s52EDV cm/s14Antegrade +---------+--------+--+--------+--+---------+  Left Carotid Findings: +----------+--------+--------+--------+--------------------------+--------+            PSV cm/sEDV cm/sStenosisPlaque Description        Comments +----------+--------+--------+--------+--------------------------+--------+ CCA Prox  66      10                                                 +----------+--------+--------+--------+--------------------------+--------+ CCA Mid   133     20                                                 +----------+--------+--------+--------+--------------------------+--------+ CCA Distal76      13              irregular and heterogenous         +----------+--------+--------+--------+--------------------------+--------+ ICA Prox  69      14              irregular and heterogenous         +----------+--------+--------+--------+--------------------------+--------+ ICA Distal88      21                                                 +----------+--------+--------+--------+--------------------------+--------+ ECA       87                                                         +----------+--------+--------+--------+--------------------------+--------+ +----------+--------+--------+----------------+-------------------+           PSV cm/sEDV cm/sDescribe        Arm Pressure (mmHG) +----------+--------+--------+----------------+-------------------+ [id number]             Multiphasic, WNL                    +----------+--------+--------+----------------+-------------------+ +---------+--------+--+--------+--+---------+ VertebralPSV cm/s51EDV cm/s13Antegrade +---------+--------+--+--------+--+---------+   Summary: Right Carotid: Velocities in the right ICA are consistent with a  1-39% stenosis.                There is possible ulcerative plaque at proximal external carotid                artery. Left Carotid: Velocities in the left ICA are consistent with a 1-39% stenosis. Vertebrals:  Bilateral vertebral arteries demonstrate antegrade flow. Subclavians: Normal flow hemodynamics were seen in bilateral  subclavian              arteries. *See table(s) above for measurements and observations.  Electronically signed by Eather Popp MD on 04/20/2024 at 5:35:45 PM.    Final    DG CHEST PORT 1 VIEW Result Date: 04/20/2024 CLINICAL DATA:  Fever. EXAM: PORTABLE CHEST 1 VIEW COMPARISON:  09/19/2023. FINDINGS: Cardiomegaly, unchanged. Prior median sternotomy and aortic valve replacement. Aortic atherosclerosis. Low lung volumes with central interstitial prominence, which could reflect bronchovascular crowding secondary to hypoinflation, atypical infection, or interstitial pulmonary edema. Retrocardiac opacity may reflect atelectasis or infiltrate. Suspected small bilateral pleural effusions. Redemonstrated postsurgical and post treatment changes at the right lung apex. No pneumothorax. Redemonstrated high-riding right humeral head, likely secondary to chronic rotator cuff tear. No acute osseous abnormality. IMPRESSION: 1. Low lung volumes with central interstitial prominence, which could reflect bronchovascular crowding secondary to hypoinflation, atypical infection, or interstitial pulmonary edema. 2. Retrocardiac opacity may reflect atelectasis or infiltrate. 3. Suspected small bilateral pleural effusions. 4. Cardiomegaly. Electronically Signed   By: Harrietta Sherry M.D.   On: 04/20/2024 13:35     Vernal Alstrom, MD  [facility] 04/22/2024  If 7PM-7AM, please contact night-coverage

## 2024-04-22 NOTE — TOC Progression Note (Addendum)
 Transition of Care [facility]) - Progression Note    Patient Details  Name: [name] MRN: [id number] Date of Birth: 01-24-1936  Transition of [facility]) CM/SW Contact  Sheri ONEIDA [place], [place] Phone Number: 04/22/2024, 2:58 PM  Clinical Narrative:    Bed choice is [facility]. Ins auth started; pending approval.  Update: Ins auth approved can dc to SNF on 8/27   Expected Discharge Plan: Skilled Nursing Facility Barriers to Discharge: Continued Medical Work up               Expected Discharge Plan and Services In-house Referral: NA Discharge Planning Services: NA Post Acute Care Choice: Skilled Nursing Facility Living arrangements for the past 2 months: Single Family Home                 DME Arranged: N/A DME Agency: NA       HH Arranged: NA HH Agency: NA         Social Drivers of Health (SDOH) Interventions SDOH Screenings   Food Insecurity: No Food Insecurity (04/19/2024)  Housing: Unknown (04/19/2024)  Transportation Needs: No Transportation Needs (04/19/2024)  Utilities: Not At Risk (04/19/2024)  Alcohol Screen: Low Risk  (10/30/2023)  Depression (PHQ2-9): Low Risk  (10/30/2023)  Financial Resource Strain: Low Risk  (10/30/2023)  Physical Activity: Insufficiently Active (10/30/2023)  Social Connections: Moderately Isolated (04/19/2024)  Stress: No Stress Concern Present (10/30/2023)  Tobacco Use: Medium Risk (04/19/2024)  Health Literacy: Adequate Health Literacy (10/30/2023)    Readmission Risk Interventions     No data to display

## 2024-04-22 NOTE — Progress Notes (Signed)
 PHARMACY - ANTICOAGULATION CONSULT NOTE  Pharmacy Consult for Coumadin  Indication: atrial fibrillation  No Known Allergies  Patient Measurements: Height: 5' 3 (160 cm) Weight: 71.6 kg (157 lb 13.6 oz) IBW/kg (Calculated) : 56.9 HEPARIN  DW (KG): 71.6  Vital Signs: Temp: 97.6 F (36.4 C) (08/26 0518) Temp Source: Oral (08/26 0518) BP: 124/67 (08/26 0518) Pulse Rate: 71 (08/26 0518)  Labs: Recent Labs    04/19/24 1050 04/20/24 0624 04/21/24 0535 04/22/24 0543  HGB 11.2* 9.8* 10.4* 9.7*  HCT 34.8* 31.2* 31.2* 31.1*  PLT 157 144* 136* 178  LABPROT 15.9* 16.3* 15.6* 17.7*  INR 1.2 1.2 1.2 1.4*  CREATININE 0.76 1.06 1.10 0.82  CKTOTAL 241  --   --   --     Estimated Creatinine Clearance: 57.4 mL/min (by C-G formula based on SCr of 0.82 mg/dL).   Medical History: Past Medical History:  Diagnosis Date   Aortic stenosis    Atrial fibrillation (HCC)    Baker cyst    Left knee   DJD (degenerative joint disease)    knees   Dysrhythmia    Eczema    Erectile dysfunction    Gout    History of hiatal hernia    History of radiation therapy    Right lung- 04/21/21-05/03/21 Dr. Lynwood Nasuti   HTN (hypertension)    MVP (mitral valve prolapse)    Shortness of breath dyspnea    with activity    Assessment: AC/Heme: Warfarin PTA resumed for afib - lower target of 2-2.5  - CHA?DS?-VASc Score of at least 5  - 5mg  Wed and 2.5mg  all other days - Hgb 9.7 down., Plts 78 , INR 1.4 up  Goal of Therapy:  INR 2-2.5 Monitor platelets by anticoagulation protocol: Yes   Plan:  Coumadin  2.5mg  po x 1 today. Daily INR  Ariyan Sinnett Karoline Marina, PharmD, BCPS Clinical Staff Pharmacist Marina Salines Stillinger 04/22/2024,7:42 AM

## 2024-04-22 NOTE — Plan of Care (Signed)
   Problem: Education: Goal: Knowledge of General Education information will improve Description Including pain rating scale, medication(s)/side effects and non-pharmacologic comfort measures Outcome: Progressing

## 2024-04-23 DIAGNOSIS — W19XXXD Unspecified fall, subsequent encounter: Secondary | ICD-10-CM | POA: Diagnosis not present

## 2024-04-23 DIAGNOSIS — M109 Gout, unspecified: Secondary | ICD-10-CM | POA: Diagnosis not present

## 2024-04-23 DIAGNOSIS — I4821 Permanent atrial fibrillation: Secondary | ICD-10-CM | POA: Diagnosis not present

## 2024-04-23 DIAGNOSIS — S8001XD Contusion of right knee, subsequent encounter: Secondary | ICD-10-CM | POA: Diagnosis not present

## 2024-04-23 DIAGNOSIS — L209 Atopic dermatitis, unspecified: Secondary | ICD-10-CM | POA: Diagnosis not present

## 2024-04-23 DIAGNOSIS — Z743 Need for continuous supervision: Secondary | ICD-10-CM | POA: Diagnosis not present

## 2024-04-23 DIAGNOSIS — D508 Other iron deficiency anemias: Secondary | ICD-10-CM | POA: Diagnosis not present

## 2024-04-23 DIAGNOSIS — E876 Hypokalemia: Secondary | ICD-10-CM | POA: Diagnosis not present

## 2024-04-23 DIAGNOSIS — D649 Anemia, unspecified: Secondary | ICD-10-CM | POA: Diagnosis not present

## 2024-04-23 DIAGNOSIS — R791 Abnormal coagulation profile: Secondary | ICD-10-CM | POA: Diagnosis not present

## 2024-04-23 DIAGNOSIS — I350 Nonrheumatic aortic (valve) stenosis: Secondary | ICD-10-CM | POA: Diagnosis not present

## 2024-04-23 DIAGNOSIS — S80211D Abrasion, right knee, subsequent encounter: Secondary | ICD-10-CM | POA: Diagnosis not present

## 2024-04-23 DIAGNOSIS — Z7401 Bed confinement status: Secondary | ICD-10-CM | POA: Diagnosis not present

## 2024-04-23 DIAGNOSIS — R0989 Other specified symptoms and signs involving the circulatory and respiratory systems: Secondary | ICD-10-CM | POA: Diagnosis not present

## 2024-04-23 DIAGNOSIS — R609 Edema, unspecified: Secondary | ICD-10-CM | POA: Diagnosis not present

## 2024-04-23 DIAGNOSIS — S80212D Abrasion, left knee, subsequent encounter: Secondary | ICD-10-CM | POA: Diagnosis not present

## 2024-04-23 DIAGNOSIS — R2681 Unsteadiness on feet: Secondary | ICD-10-CM | POA: Diagnosis not present

## 2024-04-23 DIAGNOSIS — W19XXXA Unspecified fall, initial encounter: Secondary | ICD-10-CM | POA: Diagnosis not present

## 2024-04-23 DIAGNOSIS — R5381 Other malaise: Secondary | ICD-10-CM | POA: Diagnosis not present

## 2024-04-23 DIAGNOSIS — Y92009 Unspecified place in unspecified non-institutional (private) residence as the place of occurrence of the external cause: Secondary | ICD-10-CM | POA: Diagnosis not present

## 2024-04-23 DIAGNOSIS — I359 Nonrheumatic aortic valve disorder, unspecified: Secondary | ICD-10-CM | POA: Diagnosis not present

## 2024-04-23 DIAGNOSIS — I482 Chronic atrial fibrillation, unspecified: Secondary | ICD-10-CM | POA: Diagnosis not present

## 2024-04-23 DIAGNOSIS — R6889 Other general symptoms and signs: Secondary | ICD-10-CM | POA: Diagnosis not present

## 2024-04-23 DIAGNOSIS — Z7901 Long term (current) use of anticoagulants: Secondary | ICD-10-CM | POA: Diagnosis not present

## 2024-04-23 DIAGNOSIS — Z952 Presence of prosthetic heart valve: Secondary | ICD-10-CM | POA: Diagnosis not present

## 2024-04-23 DIAGNOSIS — M6281 Muscle weakness (generalized): Secondary | ICD-10-CM | POA: Diagnosis not present

## 2024-04-23 DIAGNOSIS — E871 Hypo-osmolality and hyponatremia: Secondary | ICD-10-CM | POA: Diagnosis not present

## 2024-04-23 DIAGNOSIS — S81002A Unspecified open wound, left knee, initial encounter: Secondary | ICD-10-CM | POA: Diagnosis not present

## 2024-04-23 DIAGNOSIS — S81802A Unspecified open wound, left lower leg, initial encounter: Secondary | ICD-10-CM | POA: Diagnosis not present

## 2024-04-23 DIAGNOSIS — I110 Hypertensive heart disease with heart failure: Secondary | ICD-10-CM | POA: Diagnosis not present

## 2024-04-23 DIAGNOSIS — I493 Ventricular premature depolarization: Secondary | ICD-10-CM | POA: Diagnosis not present

## 2024-04-23 DIAGNOSIS — S80212A Abrasion, left knee, initial encounter: Secondary | ICD-10-CM | POA: Diagnosis not present

## 2024-04-23 DIAGNOSIS — I10 Essential (primary) hypertension: Secondary | ICD-10-CM | POA: Diagnosis not present

## 2024-04-23 DIAGNOSIS — J432 Centrilobular emphysema: Secondary | ICD-10-CM | POA: Diagnosis not present

## 2024-04-23 DIAGNOSIS — I5032 Chronic diastolic (congestive) heart failure: Secondary | ICD-10-CM | POA: Diagnosis not present

## 2024-04-23 DIAGNOSIS — I341 Nonrheumatic mitral (valve) prolapse: Secondary | ICD-10-CM | POA: Diagnosis not present

## 2024-04-23 DIAGNOSIS — R0609 Other forms of dyspnea: Secondary | ICD-10-CM | POA: Diagnosis not present

## 2024-04-23 DIAGNOSIS — S51001A Unspecified open wound of right elbow, initial encounter: Secondary | ICD-10-CM | POA: Diagnosis not present

## 2024-04-23 DIAGNOSIS — E538 Deficiency of other specified B group vitamins: Secondary | ICD-10-CM | POA: Diagnosis not present

## 2024-04-23 DIAGNOSIS — S61402A Unspecified open wound of left hand, initial encounter: Secondary | ICD-10-CM | POA: Diagnosis not present

## 2024-04-23 DIAGNOSIS — M1990 Unspecified osteoarthritis, unspecified site: Secondary | ICD-10-CM | POA: Diagnosis not present

## 2024-04-23 DIAGNOSIS — R55 Syncope and collapse: Secondary | ICD-10-CM | POA: Diagnosis not present

## 2024-04-23 LAB — PROTIME-INR
INR: 1.5 — ABNORMAL HIGH (ref 0.8–1.2)
Prothrombin Time: 19.1 s — ABNORMAL HIGH (ref 11.4–15.2)

## 2024-04-23 LAB — GLUCOSE, CAPILLARY: Glucose-Capillary: 95 mg/dL (ref 70–99)

## 2024-04-23 LAB — BASIC METABOLIC PANEL WITH GFR
Anion gap: 9 (ref 5–15)
BUN: 30 mg/dL — ABNORMAL HIGH (ref 8–23)
CO2: 27 mmol/L (ref 22–32)
Calcium: 8.5 mg/dL — ABNORMAL LOW (ref 8.9–10.3)
Chloride: 101 mmol/L (ref 98–111)
Creatinine, Ser: 0.76 mg/dL (ref 0.61–1.24)
GFR, Estimated: >60 mL/min (ref >60)
Glucose, Bld: 89 mg/dL (ref 70–99)
Potassium: 4.5 mmol/L (ref 3.5–5.1)
Sodium: 137 mmol/L (ref 135–145)

## 2024-04-23 MED ORDER — IPRATROPIUM-ALBUTEROL 0.5-2.5 (3) MG/3ML IN SOLN: 3.0000 mL | RESPIRATORY_TRACT | Status: DC | PRN

## 2024-04-23 MED ORDER — WARFARIN SODIUM 5 MG PO TABS: 5.0000 mg | ORAL_TABLET | Freq: Once | ORAL | Status: DC

## 2024-04-23 MED ORDER — MAGNESIUM OXIDE -MG SUPPLEMENT 400 (240 MG) MG PO TABS: 400.0000 mg | ORAL_TABLET | Freq: Two times a day (BID) | ORAL | Status: AC

## 2024-04-23 MED ORDER — WARFARIN SODIUM 2.5 MG PO TABS: 2.5000 mg | ORAL_TABLET | Freq: Once | ORAL | Status: DC

## 2024-04-23 MED ORDER — TRAMADOL HCL 50 MG PO TABS
50.0000 mg | ORAL_TABLET | Freq: Two times a day (BID) | ORAL | 0 refills | Status: DC | PRN
Start: 2024-04-23 — End: 2024-07-15

## 2024-04-23 MED ORDER — ONDANSETRON HCL 4 MG PO TABS: 4.0000 mg | ORAL_TABLET | Freq: Four times a day (QID) | ORAL | 0 refills | Status: DC | PRN

## 2024-04-23 MED ORDER — MUPIROCIN 2 % EX OINT: TOPICAL_OINTMENT | Freq: Two times a day (BID) | CUTANEOUS | 0 refills | Status: DC

## 2024-04-23 MED ORDER — BENZONATATE 100 MG PO CAPS: 100.0000 mg | ORAL_CAPSULE | Freq: Three times a day (TID) | ORAL | Status: DC | PRN

## 2024-04-23 MED ORDER — TAMSULOSIN HCL 0.4 MG PO CAPS: 0.4000 mg | ORAL_CAPSULE | Freq: Every day | ORAL | Status: AC

## 2024-04-23 MED ORDER — ENSURE PLUS HIGH PROTEIN PO LIQD: 237.0000 mL | Freq: Two times a day (BID) | ORAL | Status: AC

## 2024-04-23 MED ORDER — FUROSEMIDE 20 MG PO TABS: 20.0000 mg | ORAL_TABLET | Freq: Every day | ORAL | Status: DC

## 2024-04-23 NOTE — Discharge Summary (Signed)
 Physician Discharge Summary  [name] FMW:[id number] DOB: April 09, 1936 DOA: 04/19/2024  PCP: Theophilus Andrews, Tully GRADE, MD  Admit date: 04/19/2024 Discharge date: 04/23/2024  Admitted From: Home  Discharge disposition: SNF   Recommendations for Outpatient Follow-Up:   Follow up with your primary care provider at the SNF in 3-5 days Check CBC, BMP, magnesium  in the next visit  Discharge Diagnosis:   Principal Problem:   Fall at home Active Problems:   Gout   Essential hypertension   Chronic diastolic CHF (congestive heart failure) (HCC)   Vitamin B12 deficiency   Centrilobular emphysema (HCC)   Permanent atrial fibrillation (HCC)   Normocytic anemia   Frequent PVCs   Abrasion of both knees   Hyperbilirubinemia   Physical deconditioning   Contusion of right knee   Syncope   Discharge Condition: Improved.  Diet recommendation: Low sodium, heart healthy  Wound care: As described below  Code status: Full.   History of Present Illness:    [name] is a 88 y.o. male with past medical history significant of aortic stenosis, status post aortic valve replacement, paroxysmal atrial fibrillation, chronic diastolic heart failure, mitral valve prolapse, hypertension, centrilobular emphysema, gout,  non-small cell lung cancer,  spinal stenosis, vitamin B12 deficiency, vitamin D  deficiency presented to the hospital after sustaining a fall 4 days prior to presentation.  Patient complains of pain and swelling and redness in the knee after the fall.  In the ED, vitals were stable.  Labs were notable for normal CBC.  CK level of 241.  CMP showed mild hyponatremia with sodium of 133.  Imaging showed some prepatellar soft tissue swelling of the right knee.  Right elbow x-ray with severe DJD.  CT of the head without any acute abnormality.  EKG showed sinus tachycardia.  Patient was then considered for admission to the hospital for further evaluation and treatment.    Hospital  Course:   Following conditions were addressed during hospitalization as listed below,  Fever  1 episode of uncertain etiology, chest x-ray UA was negative.  No further fever.  Resolved at this time.     Syncope  Continue telemetry monitoring.  2 D Echo with preserved LF function.  Carotid duplex ultrasound with less than 39% stenosis with possible ulcerated plaque in the proximal external carotid artery.  Will need to continue physical therapy on discharge.     Chronic diastolic CHF (congestive heart failure) Compensated.  2D echo with preserved LF function.  Patient takes 60 mg of Lasix  at home.  Will decrease to 20 mg daily at this time   Hyponatremia. Level has normalized.  Was on Lasix  at home which was on hold during hospitalization.   Mild hypokalemia.  Improved after replacement.  Potassium prior to discharge was 4.5.  Will decrease the dose of Lasix  on discharge.     Permanent atrial fibrillation   Frequent PVCs CHA?DS?-VASc Score of at least 5.  On metoprolol .  Warfarin as per pharmacy.  Rate controlled.  Will need to revise anticoagulation in the future context of falls but patient has high risk for stroke.   Hypomagnesemia.  Improved after replacement with IV and oral magnesium .  Latest magnesium  of 2.4..  Will continue magnesium  oxide for next 5 days.     Essential hypertension Will resume amlodipine .     Normocytic anemia Monitor hematocrit and hemoglobin.  Latest hemoglobin of 10.4     Hyperbilirubinemia     Vitamin B12 deficiency Continue B12 supplementation.  Centrilobular emphysema Continue bronchodilators.    X-ray done 04/20/2024 showed low lung volumes with coarse interstitial prominence.  Cough has improved     Gout Continue allopurinol    Fall at home with abrasion of the knees and contusion of right knee Left leg and left upper extremity wound.   Physical deconditioning   PT OT evaluation recommend SNF.  Patient states by himself at home and has some  assistance.  CT scan of the knee without any fracture and has osteoarthritis.  Seen by wound care for evaluation of left forearm and left leg wound.  Continue dressing.    Disposition.  At this time, patient is stable for disposition to skilled nursing facility   Medical Consultants:   None.  Procedures:    None Subjective:   Today, patient was seen and examined at bedside.  Denies interval complaints.  Denies any nausea vomiting fever chills or rigor.  Denies any shortness of breath cough or congestion.  Discharge Exam:   Vitals:   04/22/24 2047 04/23/24 0450  BP: 136/68 132/72  Pulse: 81 75  Resp: 17 17  Temp: 98.6 F (37 C) (!) 97.5 F (36.4 C)  SpO2: 97% 97%   Vitals:   04/22/24 0959 04/22/24 1257 04/22/24 2047 04/23/24 0450  BP: 122/69 (!) 127/58 136/68 132/72  Pulse:  73 81 75  Resp:  (!) 24 17 17   Temp:  98.2 F (36.8 C) 98.6 F (37 C) (!) 97.5 F (36.4 C)  TempSrc:  Oral Oral Oral  SpO2:  98% 97% 97%  Weight:      Height:        General: Alert awake, not in obvious distress, elderly male, Communicative, HENT: pupils equally reacting to light,  No scleral pallor or icterus noted. Oral mucosa is moist.  Chest: Coarse breath sounds noted.  Diminished breath sounds bilaterally. CVS: S1 &S2 heard. No murmur.  Regular rate and rhythm. Abdomen: Soft, nontender, nondistended.  Bowel sounds are heard.   Extremities: No cyanosis, clubbing left lower and upper extremity with dressing.  Peripheral pulses are palpable. Psych: Alert, awake and oriented, normal mood CNS:  No cranial nerve deficits.  Power equal in all extremities.   Skin: Warm and dry.  Multiple bruises in the skin with scabs, dressing over the left upper and lower extremity  The results of significant diagnostics from this hospitalization (including imaging, microbiology, ancillary and laboratory) are listed below for reference.     Diagnostic Studies:   VAS US  CAROTID Result Date:  04/20/2024 Carotid Arterial Duplex Study Patient Name:  [name]  Date of Exam:   04/20/2024 Medical Rec #: [id number]        Accession #:    [id number] Date of Birth: 01-02-1936         Patient Gender: M Patient Age:   65 years Exam Location:  [facility] Procedure:      VAS US  CAROTID Referring Phys: DAVID ORTIZ --------------------------------------------------------------------------------  Indications:  Syncope. Risk Factors: Hypertension, past history of smoking. Limitations   Today's exam was limited due to constant coughing, kept asking if               we can take a break. Performing Technologist: Elmarie Lindau, RVT  Examination Guidelines: A complete evaluation includes B-mode imaging, spectral Doppler, color Doppler, and power Doppler as needed of all accessible portions of each vessel. Bilateral testing is considered an integral part of a complete examination. Limited examinations for reoccurring indications may be  performed as noted.  Right Carotid Findings: +----------+--------+--------+--------+--------------------+-------------------+           PSV cm/sEDV cm/sStenosisPlaque Description  Comments            +----------+--------+--------+--------+--------------------+-------------------+ CCA Prox  71      11                                                      +----------+--------+--------+--------+--------------------+-------------------+ CCA Distal                        irregular and                                                             heterogenous                            +----------+--------+--------+--------+--------------------+-------------------+ ICA Prox  63      15      1-39%   irregular,                                                                heterogenous and                                                          calcific                                 +----------+--------+--------+--------+--------------------+-------------------+ ICA Distal59      15                                                      +----------+--------+--------+--------+--------------------+-------------------+ ECA       63                                          possible ulcerative                                                       plaque              +----------+--------+--------+--------+--------------------+-------------------+ +----------+--------+-------+----------------+-------------------+           PSV cm/sEDV cmsDescribe        Arm Pressure (mmHG) +----------+--------+-------+----------------+-------------------+ [id number]  Multiphasic, WNL                    +----------+--------+-------+----------------+-------------------+ +---------+--------+--+--------+--+---------+ VertebralPSV cm/s52EDV cm/s14Antegrade +---------+--------+--+--------+--+---------+  Left Carotid Findings: +----------+--------+--------+--------+--------------------------+--------+           PSV cm/sEDV cm/sStenosisPlaque Description        Comments +----------+--------+--------+--------+--------------------------+--------+ CCA Prox  66      10                                                 +----------+--------+--------+--------+--------------------------+--------+ CCA Mid   133     20                                                 +----------+--------+--------+--------+--------------------------+--------+ CCA Distal76      13              irregular and heterogenous         +----------+--------+--------+--------+--------------------------+--------+ ICA Prox  69      14              irregular and heterogenous         +----------+--------+--------+--------+--------------------------+--------+ ICA Distal88      21                                                  +----------+--------+--------+--------+--------------------------+--------+ ECA       87                                                         +----------+--------+--------+--------+--------------------------+--------+ +----------+--------+--------+----------------+-------------------+           PSV cm/sEDV cm/sDescribe        Arm Pressure (mmHG) +----------+--------+--------+----------------+-------------------+ [id number]             Multiphasic, WNL                    +----------+--------+--------+----------------+-------------------+ +---------+--------+--+--------+--+---------+ VertebralPSV cm/s51EDV cm/s13Antegrade +---------+--------+--+--------+--+---------+   Summary: Right Carotid: Velocities in the right ICA are consistent with a 1-39% stenosis.                There is possible ulcerative plaque at proximal external carotid                artery. Left Carotid: Velocities in the left ICA are consistent with a 1-39% stenosis. Vertebrals:  Bilateral vertebral arteries demonstrate antegrade flow. Subclavians: Normal flow hemodynamics were seen in bilateral subclavian              arteries. *See table(s) above for measurements and observations.  Electronically signed by Eather Popp MD on 04/20/2024 at 5:35:45 PM.    Final    DG CHEST PORT 1 VIEW Result Date: 04/20/2024 CLINICAL DATA:  Fever. EXAM: PORTABLE CHEST 1 VIEW COMPARISON:  09/19/2023. FINDINGS: Cardiomegaly, unchanged. Prior median sternotomy and aortic valve replacement. Aortic atherosclerosis. Low lung volumes with central interstitial prominence, which could reflect bronchovascular crowding secondary  to hypoinflation, atypical infection, or interstitial pulmonary edema. Retrocardiac opacity may reflect atelectasis or infiltrate. Suspected small bilateral pleural effusions. Redemonstrated postsurgical and post treatment changes at the right lung apex. No pneumothorax. Redemonstrated high-riding right humeral head,  likely secondary to chronic rotator cuff tear. No acute osseous abnormality. IMPRESSION: 1. Low lung volumes with central interstitial prominence, which could reflect bronchovascular crowding secondary to hypoinflation, atypical infection, or interstitial pulmonary edema. 2. Retrocardiac opacity may reflect atelectasis or infiltrate. 3. Suspected small bilateral pleural effusions. 4. Cardiomegaly. Electronically Signed   By: Harrietta Sherry M.D.   On: 04/20/2024 13:35   ECHOCARDIOGRAM COMPLETE Result Date: 04/20/2024    ECHOCARDIOGRAM REPORT   Patient Name:   [name] Date of Exam: 04/20/2024 Medical Rec #:  [id number]       Height:       63.0 in Accession #:    [id number]      Weight:       166.4 lb Date of Birth:  September 30, 1935        BSA:          1.788 m Patient Age:    88 years        BP:           128/78 mmHg Patient Gender: M               HR:           112 bpm. Exam Location:  Inpatient Procedure: 2D Echo, Cardiac Doppler and Color Doppler (Both Spectral and Color            Flow Doppler were utilized during procedure). Indications:    Syncope R55  History:        Patient has prior history of Echocardiogram examinations, most                 recent 01/19/2023. Aortic Valve Disease; Risk                 Factors:Hypertension.                 Aortic Valve: bioprosthetic valve is present in the aortic                 position.  Sonographer:    Jayson Gaskins Referring Phys: [id number] DAVID MANUEL ORTIZ IMPRESSIONS  1. Left ventricular ejection fraction, by estimation, is 50 to 55%. The left ventricle has low normal function. The left ventricle has no regional wall motion abnormalities. There is moderate left ventricular hypertrophy. Left ventricular diastolic parameters are indeterminate.  2. Right ventricular systolic function is normal. The right ventricular size is normal. There is moderately elevated pulmonary artery systolic pressure.  3. Left atrial size was moderately dilated.  4. Right atrial size was  moderately dilated.  5. The mitral valve is abnormal. Mild mitral valve regurgitation. Moderate mitral annular calcification.  6. Tricuspid valve regurgitation is moderate.  7. Biprothetic AVR stable mean gradient mild AR no PVL no details in Epic on size/type of valve. The aortic valve has been repaired/replaced. Aortic valve regurgitation is mild. There is a bioprosthetic valve present in the aortic position.  8. The inferior vena cava is dilated in size with >50% respiratory variability, suggesting right atrial pressure of 8 mmHg. FINDINGS  Left Ventricle: Left ventricular ejection fraction, by estimation, is 50 to 55%. The left ventricle has low normal function. The left ventricle has no regional wall motion abnormalities. Strain was performed and the global longitudinal strain  is indeterminate. The left ventricular internal cavity size was normal in size. There is moderate left ventricular hypertrophy. Left ventricular diastolic parameters are indeterminate. Right Ventricle: The right ventricular size is normal. No increase in right ventricular wall thickness. Right ventricular systolic function is normal. There is moderately elevated pulmonary artery systolic pressure. The tricuspid regurgitant velocity is 3.45 m/s, and with an assumed right atrial pressure of 8 mmHg, the estimated right ventricular systolic pressure is 55.6 mmHg. Left Atrium: Left atrial size was moderately dilated. Right Atrium: Right atrial size was moderately dilated. Pericardium: There is no evidence of pericardial effusion. Mitral Valve: The mitral valve is abnormal. There is moderate thickening of the mitral valve leaflet(s). There is mild calcification of the mitral valve leaflet(s). Moderate mitral annular calcification. Mild mitral valve regurgitation. MV peak gradient,  6.9 mmHg. The mean mitral valve gradient is 3.0 mmHg. Tricuspid Valve: The tricuspid valve is grossly normal. Tricuspid valve regurgitation is moderate. Aortic  Valve: Biprothetic AVR stable mean gradient mild AR no PVL no details in Epic on size/type of valve. The aortic valve has been repaired/replaced. Aortic valve regurgitation is mild. Aortic regurgitation PHT measures 787 msec. Aortic valve mean gradient measures 16.0 mmHg. Aortic valve peak gradient measures 29.7 mmHg. Aortic valve area, by VTI measures 1.42 cm. There is a bioprosthetic valve present in the aortic position. Pulmonic Valve: The pulmonic valve was grossly normal. Pulmonic valve regurgitation is trivial. Aorta: The aortic root is normal in size and structure. Venous: The inferior vena cava is dilated in size with greater than 50% respiratory variability, suggesting right atrial pressure of 8 mmHg. IAS/Shunts: No atrial level shunt detected by color flow Doppler. Additional Comments: 3D was performed not requiring image post processing on an independent workstation and was indeterminate.  LEFT VENTRICLE PLAX 2D LVIDd:         4.60 cm LVIDs:         3.50 cm LV PW:         1.45 cm LV IVS:        1.55 cm LVOT diam:     1.90 cm LV SV:         66 LV SV Index:   37 LVOT Area:     2.84 cm  RIGHT VENTRICLE RV S prime:     9.25 cm/s TAPSE (M-mode): 1.5 cm LEFT ATRIUM              Index        RIGHT ATRIUM           Index LA Vol (A2C):   127.0 ml 71.01 ml/m  RA Area:     30.95 cm LA Vol (A4C):   88.2 ml  49.32 ml/m  RA Volume:   103.50 ml 57.87 ml/m LA Biplane Vol: 107.0 ml 59.83 ml/m  AORTIC VALVE AV Area (Vmax):    1.25 cm AV Area (Vmean):   1.20 cm AV Area (VTI):     1.42 cm AV Vmax:           272.50 cm/s AV Vmean:          193.000 cm/s AV VTI:            0.462 m AV Peak Grad:      29.7 mmHg AV Mean Grad:      16.0 mmHg LVOT Vmax:         120.00 cm/s LVOT Vmean:        81.400 cm/s LVOT VTI:  0.232 m LVOT/AV VTI ratio: 0.50 AI PHT:            787 msec  AORTA Ao Root diam: 3.10 cm MITRAL VALVE                TRICUSPID VALVE MV Area (PHT): 3.50 cm     TR Peak grad:   47.6 mmHg MV Area VTI:    3.45 cm     TR Vmax:        345.00 cm/s MV Peak grad:  6.9 mmHg MV Mean grad:  3.0 mmHg     SHUNTS MV Vmax:       1.31 m/s     Systemic VTI:  0.23 m MV Vmean:      77.8 cm/s    Systemic Diam: 1.90 cm MV Decel Time: 217 msec MV E velocity: 128.00 cm/s Maude Emmer MD Electronically signed by Maude Emmer MD Signature Date/Time: 04/20/2024/10:30:45 AM    Final    CT Knee Right Wo Contrast Result Date: 04/19/2024 CLINICAL DATA:  Pain and inability to bear weight on the right knee after recent fall. EXAM: CT OF THE RIGHT KNEE WITHOUT CONTRAST TECHNIQUE: Multidetector CT imaging of the right knee was performed according to the standard protocol. Multiplanar CT image reconstructions were also generated. RADIATION DOSE REDUCTION: This exam was performed according to the departmental dose-optimization program which includes automated exposure control, adjustment of the mA and/or kV according to patient size and/or use of iterative reconstruction technique. COMPARISON:  Right knee radiographs dated 04/19/2024. FINDINGS: Motion degradation slightly limits the sensitivity of this exam. Bones/Joint/Cartilage No acute fracture or dislocation. Severe medial femorotibial compartment joint space narrowing resulting in bone-on-bone contact, subchondral sclerosis, and marginal osteophytosis. Moderate patellofemoral compartment joint space narrowing and osteophytosis. Foci of mineralization in the medial and lateral compartments, may be dystrophic or reflect chondrocalcinosis. Moderate knee joint effusion. No evidence of lipohemarthrosis. Patellar enthesopathy. Ligaments Ligaments are suboptimally evaluated by CT. Muscles and Tendons Muscles are within normal limits for age. Small Baker's cyst. Visualized patellar and quadriceps tendons appear grossly intact. Soft tissue Mild subcutaneous edema anteriorly.  No loculated fluid collection. IMPRESSION: 1. No acute fracture identified.  No dislocation. 2. Moderate to severe  tricompartmental osteoarthritis of the right knee, most pronounced in the medial femorotibial compartment resulting in bone-on-bone contact. 3. Moderate-sized knee joint effusion. No evidence of lipohemarthrosis. Electronically Signed   By: Harrietta Sherry M.D.   On: 04/19/2024 12:48   DG Elbow Complete Left Result Date: 04/19/2024 CLINICAL DATA:  88 year old male status post fall earlier this week. Unable to weightbear. EXAM: LEFT ELBOW - COMPLETE 3+ VIEW COMPARISON:  None Available. FINDINGS: Four views. Severe left elbow joint degeneration. Joint space loss, subchondral sclerosis, osteophytosis. Bulky heterotopic calcification or ossification of the adjacent soft tissues. No definite joint effusion. No acute fracture or dislocation identified. Abnormal and asymmetric soft tissue contours about the left elbow and visible forearm (image #1). No soft tissue gas identified. Calcified peripheral vascular disease. IMPRESSION: 1. Possible soft tissue hematoma about the left elbow and in the forearm. 2. Severe left elbow joint degeneration. No acute fracture or dislocation identified. 3. Calcified peripheral vascular disease. Electronically Signed   By: VEAR Hurst M.D.   On: 04/19/2024 11:17   DG Elbow Complete Right Result Date: 04/19/2024 CLINICAL DATA:  88 year old male status post fall earlier this week. Unable to weightbear. EXAM: RIGHT ELBOW - COMPLETE 3+ VIEW COMPARISON:  None Available. FINDINGS: Four views. Severe degeneration throughout the right  elbow. Joint space loss, subchondral sclerosis, osteophytosis, and bulky heterotopic calcification or ossification. This includes a large 2 cm antecubital fossa calcification. No obvious joint effusion. No superimposed acute fracture or dislocation identified. Calcified peripheral vascular disease. IMPRESSION: 1. Severe right elbow degeneration. Bulky heterotopic calcification or ossification. No acute fracture or dislocation identified. 2. Calcified peripheral  vascular disease. Electronically Signed   By: VEAR Hurst M.D.   On: 04/19/2024 11:15   DG Knee Complete 4 Views Right Result Date: 04/19/2024 CLINICAL DATA:  88 year old male status post fall earlier this week. Unable to weightbear. EXAM: RIGHT KNEE - COMPLETE 4+ VIEW COMPARISON:  Knee series 03/15/2022. FINDINGS: Four views including cross-table lateral. Chronic severe tricompartmental degenerative spurring, severe chronic medial right knee joint space loss. Medial and lateral compartment dystrophic calcification. Suprapatellar soft tissue swelling which could be joint effusion or hematoma. Patella appears intact. No acute osseous abnormality identified. Calcified peripheral vascular disease. IMPRESSION: 1. Suprapatellar soft tissue swelling could be joint effusion or hematoma. 2. Chronic severe right knee joint degeneration. No acute fracture or dislocation identified. Electronically Signed   By: VEAR Hurst M.D.   On: 04/19/2024 11:13   CT Head Wo Contrast Result Date: 04/19/2024 CLINICAL DATA:  88 year old male status post fall earlier this week. Unable to weightbear. EXAM: CT HEAD WITHOUT CONTRAST TECHNIQUE: Contiguous axial images were obtained from the base of the skull through the vertex without intravenous contrast. RADIATION DOSE REDUCTION: This exam was performed according to the departmental dose-optimization program which includes automated exposure control, adjustment of the mA and/or kV according to patient size and/or use of iterative reconstruction technique. COMPARISON:  Head CT 01/13/2024. FINDINGS: Brain: Stable cerebral volume. No midline shift, ventriculomegaly, mass effect, evidence of mass lesion, intracranial hemorrhage or evidence of cortically based acute infarction. Chronic dural calcification incidentally noted. Patchy and confluent moderate bilateral cerebral white matter hypodensity. Chronic deep white matter capsule involvement. Stable gray-white matter differentiation throughout the  brain. Vascular: Calcified atherosclerosis at the skull base. No suspicious intracranial vascular hyperdensity. Skull: Appears stable and intact. No acute osseous abnormality identified. Sinuses/Orbits: Visualized paranasal sinuses and mastoids are clear. Other: Calcified scalp vessel atherosclerosis. Stable orbit and scalp soft tissues. IMPRESSION: 1. No acute intracranial abnormality or recent traumatic injury identified. 2. Stable non contrast CT appearance of chronic small vessel disease. 3. Calcified scalp vessel atherosclerosis which can be seen with end stage renal disease. Electronically Signed   By: VEAR Hurst M.D.   On: 04/19/2024 11:12     Labs:   Basic Metabolic Panel: Recent Labs  Lab 04/19/24 1050 04/20/24 0624 04/21/24 0535 04/22/24 0543 04/23/24 0600  NA 133* 132* 129* 135 137  K 3.9 3.6 4.5 3.4* 4.5  CL 95* 98 97* 100 101  CO2 24 24 24 27 27   GLUCOSE 80 101* 87 110* 89  BUN 19 23 17  32* 30*  CREATININE 0.76 1.06 1.10 0.82 0.76  CALCIUM 8.6* 8.1* 8.9 8.1* 8.5*  MG 2.2  --  1.5* 2.4  --   PHOS 3.2  --   --   --   --    GFR Estimated Creatinine Clearance: 58.9 mL/min (by C-G formula based on SCr of 0.76 mg/dL). Liver Function Tests: Recent Labs  Lab 04/19/24 1050 04/20/24 0624 04/22/24 0543  AST 32 23 73*  ALT 14 15 53*  ALKPHOS 42 39 47  BILITOT 3.0* 1.6* 0.6  PROT 6.4* 5.4* 5.3*  ALBUMIN  2.9* 2.5* 2.3*   No results for input(s): LIPASE, AMYLASE  in the last 168 hours. No results for input(s): AMMONIA in the last 168 hours. Coagulation profile Recent Labs  Lab 04/19/24 1050 04/20/24 0624 04/21/24 0535 04/22/24 0543 04/23/24 0600  INR 1.2 1.2 1.2 1.4* 1.5*    CBC: Recent Labs  Lab 04/19/24 1050 04/20/24 0624 04/21/24 0535 04/22/24 0543  WBC 10.0 8.2 9.0 6.8  NEUTROABS 8.3*  --   --   --   HGB 11.2* 9.8* 10.4* 9.7*  HCT 34.8* 31.2* 31.2* 31.1*  MCV 96.1 96.3 93.1 97.8  PLT 157 144* 136* 178   Cardiac Enzymes: Recent Labs  Lab  04/19/24 1050  CKTOTAL 241   BNP: Invalid input(s): POCBNP CBG: Recent Labs  Lab 04/20/24 0500 04/20/24 0846 04/21/24 0615 04/22/24 0543 04/23/24 0507  GLUCAP 118* 98 124* 122* 95   D-Dimer No results for input(s): DDIMER in the last 72 hours. Hgb A1c No results for input(s): HGBA1C in the last 72 hours. Lipid Profile No results for input(s): CHOL, HDL, LDLCALC, TRIG, CHOLHDL, LDLDIRECT in the last 72 hours. Thyroid  function studies No results for input(s): TSH, T4TOTAL, T3FREE, THYROIDAB in the last 72 hours.  Invalid input(s): FREET3 Anemia work up No results for input(s): VITAMINB12, FOLATE, FERRITIN, TIBC, IRON, RETICCTPCT in the last 72 hours. Microbiology No results found for this or any previous visit (from the past 240 hours).   Discharge Instructions:   Discharge Instructions     Call MD for:  severe uncontrolled pain   Complete by: As directed    Call MD for:  temperature >100.4   Complete by: As directed    Diet - low sodium heart healthy   Complete by: As directed    Discharge instructions   Complete by: As directed    Follow-up with your primary care provider at the skilled nursing facility in 3 to 5 days.  Check blood work at that time.  Continue physical therapy.  Continue dressing.  Seek medical attention for worsening symptoms.   Discharge wound care:   Complete by: As directed    1. Cleanse L lower lateral leg wound with Vashe, allow to air dry. Apply Xeroform gauze ([facility]son [phone]) to wound bed daily, cover with ABD pad and secure with Kerlix roll gauze. SOAK DRESSING WITH NS IF ADHERED TO WOUND BED FOR ATRAUMATIC REMOVAL.  2.         Cleanse L forearm/wrist and L posterior arm wounds with Vashe wound cleanser, Apply Vashe moistened gauze to  wound beds daily, cover with ABD pad and wrap with Kerlix roll gauze to secure. SOAK DRESSING WITH NS IF ADHERED TO WOUND BED FOR ATRAUMATIC REMOVAL.    Wound care  2 times  daily      Comments: Cleanse L knee wound with Vashe, do not rinse and allow to air dry.  Apply Mupirocin  ointment to wound bed 2 times a day and secure with silicone foam.   Increase activity slowly   Complete by: As directed       Allergies as of 04/23/2024   No Known Allergies      Medication List     TAKE these medications    allopurinol  300 MG tablet Commonly known as: ZYLOPRIM  Take 1 tablet by mouth once daily   amLODipine  5 MG tablet Commonly known as: NORVASC  Take 1 tablet by mouth once daily   benzonatate  100 MG capsule Commonly known as: TESSALON  Take 1 capsule (100 mg total) by mouth 3 (three) times daily as needed for cough.  cyanocobalamin  500 MCG tablet Commonly known as: VITAMIN B12 Take 1 tablet (500 mcg total) by mouth daily. What changed: additional instructions   feeding supplement Liqd Take 237 mLs by mouth 2 (two) times daily between meals.   furosemide  20 MG tablet Commonly known as: LASIX  Take 1 tablet (20 mg total) by mouth daily after breakfast. What changed:  how much to take when to take this   ipratropium-albuterol  0.5-2.5 (3) MG/3ML Soln Commonly known as: DUONEB Take 3 mLs by nebulization every 3 (three) hours as needed.   magnesium  oxide 400 (240 Mg) MG tablet Commonly known as: MAG-OX Take 1 tablet (400 mg total) by mouth 2 (two) times daily for 5 days.   mupirocin  ointment 2 % Commonly known as: BACTROBAN  Apply topically 2 (two) times daily.   ondansetron  4 MG tablet Commonly known as: ZOFRAN  Take 1 tablet (4 mg total) by mouth every 6 (six) hours as needed for nausea.   potassium chloride  SA 20 MEQ tablet Commonly known as: KLOR-CON  M Take 1 tablet (20 mEq total) by mouth daily. What changed: when to take this   predniSONE  20 MG tablet Commonly known as: DELTASONE  Take 1 tablet by mouth once daily with breakfast   tamsulosin  0.4 MG Caps capsule Commonly known as: FLOMAX  Take 1 capsule (0.4 mg total) by mouth  daily after supper. What changed: See the new instructions.   traMADol  50 MG tablet Commonly known as: ULTRAM  Take 1 tablet (50 mg total) by mouth every 12 (twelve) hours as needed.   traZODone  50 MG tablet Commonly known as: DESYREL  TAKE 1 TABLET BY MOUTH AT BEDTIME AS NEEDED What changed: reasons to take this   warfarin 5 MG tablet Commonly known as: COUMADIN  Take as directed. If you are unsure how to take this medication, talk to your nurse or doctor. Original instructions: TAKE 1/2 (ONE-HALF) A TABLET BY MOUTH ONCE DAILY EXCEPT  TAKE 1 TABLET ON WEDNESDAY AS DIRECTED  BY  ANTICOAGULATION  CLINIC What changed:  how much to take how to take this when to take this               Discharge Care Instructions  (From admission, onward)           Start     Ordered   04/23/24 0000  Discharge wound care:       Comments: 1. Cleanse L lower lateral leg wound with Vashe, allow to air dry. Apply Xeroform gauze ([facility] [phone]) to wound bed daily, cover with ABD pad and secure with Kerlix roll gauze. SOAK DRESSING WITH NS IF ADHERED TO WOUND BED FOR ATRAUMATIC REMOVAL.  2.         Cleanse L forearm/wrist and L posterior arm wounds with Vashe wound cleanser, Apply Vashe moistened gauze to  wound beds daily, cover with ABD pad and wrap with Kerlix roll gauze to secure. SOAK DRESSING WITH NS IF ADHERED TO WOUND BED FOR ATRAUMATIC REMOVAL.    Wound care  2 times daily      Comments: Cleanse L knee wound with Vashe, do not rinse and allow to air dry.  Apply Mupirocin  ointment to wound bed 2 times a day and secure with silicone foam.   04/23/24 0851            Contact information for after-discharge care     Destination     [facility] .   Service: Skilled Nursing Contact information: [address]  [place]  [address] [phone]                      Time coordinating discharge: 39  minutes  Signed:  Macklyn Glandon  [facility] 04/23/2024, 9:24 AM

## 2024-04-23 NOTE — Progress Notes (Addendum)
 Pt ready for discharge. Daughter at bedside. IV removed. Report given to [employer] Kimberly LPN at [phone]. [facility] on unit at 330PM and ready to transport patient. Package given to transport.

## 2024-04-23 NOTE — Progress Notes (Signed)
 Wound care to Left Knee performed per order. 04/22/24 2130

## 2024-04-23 NOTE — Progress Notes (Addendum)
 PHARMACY - ANTICOAGULATION CONSULT NOTE  Pharmacy Consult for Coumadin  Indication: atrial fibrillation  No Known Allergies  Patient Measurements: Height: 5' 3 (160 cm) Weight: 71.6 kg (157 lb 13.6 oz) IBW/kg (Calculated) : 56.9 HEPARIN  DW (KG): 71.6  Vital Signs: Temp: 97.5 F (36.4 C) (08/27 0450) Temp Source: Oral (08/27 0450) BP: 132/72 (08/27 0450) Pulse Rate: 75 (08/27 0450)  Labs: Recent Labs    04/21/24 0535 04/22/24 0543 04/23/24 0600  HGB 10.4* 9.7*  --   HCT 31.2* 31.1*  --   PLT 136* 178  --   LABPROT 15.6* 17.7* 19.1*  INR 1.2 1.4* 1.5*  CREATININE 1.10 0.82 0.76    Estimated Creatinine Clearance: 58.9 mL/min (by C-G formula based on SCr of 0.76 mg/dL).   Medical History: Past Medical History:  Diagnosis Date   Aortic stenosis    Atrial fibrillation (HCC)    Baker cyst    Left knee   DJD (degenerative joint disease)    knees   Dysrhythmia    Eczema    Erectile dysfunction    Gout    History of hiatal hernia    History of radiation therapy    Right lung- 04/21/21-05/03/21 Dr. Lynwood Nasuti   HTN (hypertension)    MVP (mitral valve prolapse)    Shortness of breath dyspnea    with activity    Assessment: 88 year old male on warfarin PTA for afib. Patient presented following a fall with pain and swelling in his knee. Warfarin continued. Note home dose of warfarin 2.5 mg daily per pt report, consistent with most recent anticoag clinic visit on 8/19. At that visit, patient noted to have had recent fall. Target INR of 2-2.5. CHA?DS?-VASc Score of at least 5.  -INR 1.5, slowly trending up -No complications of therapy noted  Goal of Therapy:  INR 2-2.5 Monitor platelets by anticoagulation protocol: Yes   Plan:  -Warfarin 2.5 mg today -Note plan for discharge to SNF, likely today -Would recommend discharging on home dose of warfarin as patient was previously therapeutic within lower INR range at most recent anticoag visit; hopefully SNF can  follow closely   Stefano MARLA Bologna, PharmD, BCPS Clinical Pharmacist 04/23/2024 8:11 AM

## 2024-04-23 NOTE — Progress Notes (Unsigned)
 Cardiology Clinic Note   Patient Name: [name] Date of Encounter: 04/25/2024  Primary Care Provider:  Theophilus Andrews, Tully GRADE, MD Primary Cardiologist:  Redell Shallow, MD  Patient Profile    [name] 88 year old male presents the clinic today for follow-up evaluation of his chronic diastolic CHF and atrial fibrillation.  Past Medical History    Past Medical History:  Diagnosis Date   Aortic stenosis    Atrial fibrillation (HCC)    Baker cyst    Left knee   DJD (degenerative joint disease)    knees   Dysrhythmia    Eczema    Erectile dysfunction    Gout    History of hiatal hernia    History of radiation therapy    Right lung- 04/21/21-05/03/21 Dr. Lynwood Nasuti   HTN (hypertension)    MVP (mitral valve prolapse)    Shortness of breath dyspnea    with activity   Past Surgical History:  Procedure Laterality Date    polyps vocal cord  1994   AORTIC VALVE REPLACEMENT  ~2004   BRONCHIAL BIOPSY  03/07/2021   Procedure: BRONCHIAL BIOPSIES;  Surgeon: Shelah Lamar RAMAN, MD;  Location: [facility];  Service: Pulmonary;;   BRONCHIAL BIOPSY  11/14/2021   Procedure: BRONCHIAL BIOPSIES;  Surgeon: Shelah Lamar RAMAN, MD;  Location: [facility];  Service: Pulmonary;;   BRONCHIAL BRUSHINGS  03/07/2021   Procedure: BRONCHIAL BRUSHINGS;  Surgeon: Shelah Lamar RAMAN, MD;  [facility];  Service: Pulmonary;;   BRONCHIAL BRUSHINGS  11/14/2021   Procedure: BRONCHIAL BRUSHINGS;  Surgeon: Shelah Lamar RAMAN, MD;  [facility]: [facility];  Service: Pulmonary;;   BRONCHIAL NEEDLE ASPIRATION BIOPSY  03/07/2021   Procedure: BRONCHIAL NEEDLE ASPIRATION BIOPSIES;  Surgeon: Shelah Lamar RAMAN, MD;  [facility];  Service: Pulmonary;;   BRONCHIAL NEEDLE ASPIRATION BIOPSY  11/14/2021   Procedure: BRONCHIAL NEEDLE ASPIRATION BIOPSIES;  Surgeon: Shelah Lamar RAMAN, MD;  [facility];  Service: Pulmonary;;   BRONCHIAL WASHINGS  09/21/2020   Procedure: BRONCHIAL WASHINGS;   Surgeon: Kara Dorn NOVAK, MD;  Location: [facility];  Service: Pulmonary;;  BAL    BRONCHIAL WASHINGS  03/07/2021   Procedure: BRONCHIAL WASHINGS;  Surgeon: Shelah Lamar RAMAN, MD;  [facility];  Service: Pulmonary;;   BRONCHIAL WASHINGS  11/14/2021   Procedure: BRONCHIAL WASHINGS;  Surgeon: Shelah Lamar RAMAN, MD;  [facility];  Service: Pulmonary;;   FIDUCIAL MARKER PLACEMENT  03/07/2021   Procedure: FIDUCIAL MARKER PLACEMENT;  Surgeon: Shelah Lamar RAMAN, MD;  [facility];  Service: Pulmonary;;   HERNIA REPAIR  [phone]   HERNIA REPAIR  ~1983   IR ANGIOGRAM PELVIS SELECTIVE OR SUPRASELECTIVE  12/01/2022   IR ANGIOGRAM PELVIS SELECTIVE OR SUPRASELECTIVE  12/01/2022   IR ANGIOGRAM SELECTIVE EACH ADDITIONAL VESSEL  12/01/2022   IR ANGIOGRAM SELECTIVE EACH ADDITIONAL VESSEL  12/01/2022   IR CT SPINE LTD  12/01/2022   IR CT SPINE LTD  12/01/2022   IR CT SPINE LTD  12/01/2022   IR EMBO ARTERIAL NOT HEMORR HEMANG INC GUIDE ROADMAPPING  12/01/2022   IR EMBO TUMOR ORGAN ISCHEMIA INFARCT INC GUIDE ROADMAPPING  12/01/2022   IR RADIOLOGIST EVAL & MGMT  11/17/2022   IR US  GUIDE VASC ACCESS LEFT  12/01/2022   IR US  GUIDE VASC ACCESS RIGHT  12/01/2022   LUMBAR LAMINECTOMY/DECOMPRESSION MICRODISCECTOMY N/A 01/15/2015   Procedure: 2 LEVEL DECOMPRESSIVE LUMBAR LAMINECTOMY L3-L4,L4-L5;  Surgeon: Tanda Heading, MD;  Location: [facility];  Service: Orthopedics;  Laterality: N/A;   LUMBAR LAMINECTOMY/DECOMPRESSION MICRODISCECTOMY N/A 05/01/2017   Procedure: Lumbar one-Lumbar two, Lumbar two-Lumbar three, Lumbar three-Lumbar four Laminectomy; Evacuation of hematoma;  Surgeon: Lanis Pupa, MD;  Location[facility];  Service: Neurosurgery;  Laterality: N/A;   right total hip  ~4 years ago   ROTATOR CUFF REPAIR Left ~2009   TOTAL KNEE ARTHROPLASTY Left 04/14/2015   Procedure: LEFT TOTAL KNEE ARTHROPLASTY;  Surgeon: Tanda Heading, MD;  Location: [facility];  Service: Orthopedics;  Laterality: Left;   VIDEO  BRONCHOSCOPY N/A 09/21/2020   Procedure: VIDEO BRONCHOSCOPY WITHOUT FLUORO;  Surgeon: Kara Dorn NOVAK, MD;  Location: [facility];  Service: Pulmonary;  Laterality: N/A;   VIDEO BRONCHOSCOPY WITH ENDOBRONCHIAL NAVIGATION Right 03/07/2021   Procedure: VIDEO BRONCHOSCOPY WITH ENDOBRONCHIAL NAVIGATION;  Surgeon: Shelah Lamar RAMAN, MD;  Location: [facility];  Service: Pulmonary;  Laterality: Right;   VIDEO BRONCHOSCOPY WITH RADIAL ENDOBRONCHIAL ULTRASOUND  11/14/2021   Procedure: VIDEO BRONCHOSCOPY WITH RADIAL ENDOBRONCHIAL ULTRASOUND;  Surgeon: Shelah Lamar RAMAN, MD;  [facility];  Service: Pulmonary;;    Allergies  No Known Allergies  History of Present Illness    [name] has a PMH of aortic stenosis and is status post AVR and thoracic aortic aneurysm repair 7/06.  He underwent preoperative cardiac cath which showed normal coronary arteries.  He had carotid Dopplers due to bruits which showed 0-39% bilateral stenosis.  He was admitted 9/18 with back pain for planned laminectomy.  However, he was found to have spontaneous epidural hematoma which was evacuated.  His anticoagulation was carefully resumed afterwards.  His cardiac event monitor 4/19 showed atrial fibrillation with multiple pauses greater than 3 seconds that occurred late p.m. in early a.m. hours.  He was referred to EP following his results for consideration of PPM.  He declined.  His echocardiogram 5/24 showed normal LVEF, prior aortic valve replacement with mean gradient of 60 mmHg and mild aortic insufficiency, severe LVH, severe biatrial enlargement, mild mitral regurgitation.  He was noted to have bleeding following tooth extraction 1/25.   His PMH also includes chronic diastolic CHF, essential hypertension, and permanent atrial fibrillation.  He was seen in follow-up by Dr. Pietro on 01/01/2024.  During that time he did note dyspnea with increased physical activity but denied this with normal routine activities.  He  denied orthopnea and PND.  He denied lower extremity swelling, chest pain and syncope.  He denied recurrent bleeding issues.  He was admitted to the hospital on 04/19/2024 and discharged on 04/23/2024.  He had a fall at home.  Head CT showed no acute abnormalities.  His EKG showed sinus tachycardia.  He was placed on telemetry.  He was noted to be rate controlled.  CHA2DS2-VASc score 5.  He was noted to have frequent PVCs.  He was noted to have hypomagnesemia.  He received magnesium  supplementation.  Physical therapy was prescribed for deconditioning.  Formal PT OT evaluation recommended at SNF.  He presents to the clinic today for follow-up evaluation and states he is doing physical therapy at [facility].  He has not had any further episodes of syncope.  It was felt that his episode was related to deconditioning.  We reviewed his previous cardiac event monitor.  His blood pressure today is 108/58.  We also discussed risk for falls and anticoagulation.  I reviewed Watchman procedure.  I instructed him and his daughter to contact us  if they would want to pursue this further.  They expressed understanding.  I will  continue his current medication regimen and plan follow-up in 3 to 4 months.  Today he denies chest pain, increased shortness of breath, lower extremity edema, fatigue, , hematuria, hemoptysis, diaphoresis, presyncope, syncope, orthopnea, and PND.   Home Medications    Prior to Admission medications   Medication Sig Start Date End Date Taking? Authorizing Provider  allopurinol  (ZYLOPRIM ) 300 MG tablet Take 1 tablet by mouth once daily 11/07/23   Theophilus Andrews, Estela Y, MD  amLODipine  (NORVASC ) 5 MG tablet Take 1 tablet by mouth once daily 11/07/23   Hernandez Acosta, Tully GRADE, MD  benzonatate  (TESSALON ) 100 MG capsule Take 1 capsule (100 mg total) by mouth 3 (three) times daily as needed for cough. 04/23/24   Pokhrel, Vernal, MD  cyanocobalamin  (VITAMIN B12) 500 MCG tablet Take 1 tablet  (500 mcg total) by mouth daily. Patient taking differently: Take 500 mcg by mouth daily. BID 09/22/23   Regalado, Belkys A, MD  feeding supplement (ENSURE PLUS HIGH PROTEIN) LIQD Take 237 mLs by mouth 2 (two) times daily between meals. 04/23/24   Pokhrel, Laxman, MD  furosemide  (LASIX ) 20 MG tablet Take 1 tablet (20 mg total) by mouth daily after breakfast. 04/23/24   Pokhrel, Laxman, MD  ipratropium-albuterol  (DUONEB) 0.5-2.5 (3) MG/3ML SOLN Take 3 mLs by nebulization every 3 (three) hours as needed. 04/23/24   Pokhrel, Vernal, MD  magnesium  oxide (MAG-OX) 400 (240 Mg) MG tablet Take 1 tablet (400 mg total) by mouth 2 (two) times daily for 5 days. 04/23/24 04/28/24  Pokhrel, Vernal, MD  mupirocin  ointment (BACTROBAN ) 2 % Apply topically 2 (two) times daily. 04/23/24   Pokhrel, Vernal, MD  ondansetron  (ZOFRAN ) 4 MG tablet Take 1 tablet (4 mg total) by mouth every 6 (six) hours as needed for nausea. 04/23/24   Pokhrel, Laxman, MD  potassium chloride  SA (KLOR-CON  M) 20 MEQ tablet Take 1 tablet (20 mEq total) by mouth daily. Patient taking differently: Take 20 mEq by mouth in the morning. 07/11/23   Pietro Redell RAMAN, MD  predniSONE  (DELTASONE ) 20 MG tablet Take 1 tablet by mouth once daily with breakfast 01/10/24   Theophilus Andrews, Tully GRADE, MD  tamsulosin  (FLOMAX ) 0.4 MG CAPS capsule Take 1 capsule (0.4 mg total) by mouth daily after supper. 04/23/24   Pokhrel, Laxman, MD  traMADol  (ULTRAM ) 50 MG tablet Take 1 tablet (50 mg total) by mouth every 12 (twelve) hours as needed. 04/23/24 04/23/25  Pokhrel, Laxman, MD  traZODone  (DESYREL ) 50 MG tablet TAKE 1 TABLET BY MOUTH AT BEDTIME AS NEEDED Patient taking differently: Take 50 mg by mouth at bedtime as needed for sleep. 03/27/24   Theophilus Andrews, Tully GRADE, MD  warfarin (COUMADIN ) 5 MG tablet TAKE 1/2 (ONE-HALF) A TABLET BY MOUTH ONCE DAILY EXCEPT  TAKE 1 TABLET ON WEDNESDAY AS DIRECTED  BY  ANTICOAGULATION  CLINIC Patient taking differently: Take 2.5 mg by mouth  daily. TAKE 1/2 (ONE-HALF) A TABLET BY MOUTH ONCE DAILY EXCEPT  TAKE 1 TABLET ON WEDNESDAY AS DIRECTED  BY  ANTICOAGULATION  CLINIC 03/27/24   Theophilus Andrews, Tully GRADE, MD    Family History    Family History  Problem Relation Age of Onset   Diabetes Other    Stroke Other    He indicated that his mother is deceased. He indicated that his father is deceased. He indicated that his maternal grandmother is deceased. He indicated that his maternal grandfather is deceased. He indicated that his paternal grandmother is deceased. He indicated that his paternal grandfather  is deceased. He indicated that the status of his other is unknown.  Social History    Social History   Socioeconomic History   Marital status: Divorced    Spouse name: Not on file   Number of children: Not on file   Years of education: Not on file   Highest education level: Not on file  Occupational History   Not on file  Tobacco Use   Smoking status: Former    Current packs/day: 0.00    Types: Cigarettes    Start date: 08/28/1972    Quit date: 08/28/2002    Years since quitting: 21.6   Smokeless tobacco: Never  Vaping Use   Vaping status: Never Used  Substance and Sexual Activity   Alcohol use: Yes    Alcohol/week: 3.0 standard drinks of alcohol    Types: 3 Shots of liquor per week    Comment: social   Drug use: No   Sexual activity: Not on file  Other Topics Concern   Not on file  Social History Narrative   Not on file   Social Drivers of Health   Financial Resource Strain: Low Risk  (10/30/2023)   Overall Financial Resource Strain (CARDIA)    Difficulty of Paying Living Expenses: Not hard at all  Food Insecurity: No Food Insecurity (04/19/2024)   Hunger Vital Sign    Worried About Running Out of Food in the Last Year: Never true    Ran Out of Food in the Last Year: Never true  Transportation Needs: No Transportation Needs (04/19/2024)   PRAPARE - Administrator, Civil Service (Medical): No     Lack of Transportation (Non-Medical): No  Physical Activity: Insufficiently Active (10/30/2023)   Exercise Vital Sign    Days of Exercise per Week: 3 days    Minutes of Exercise per Session: 30 min  Stress: No Stress Concern Present (10/30/2023)   [employer] of Occupational Health - Occupational Stress Questionnaire    Feeling of Stress : Not at all  Social Connections: Moderately Isolated (04/19/2024)   Social Connection and Isolation Panel    Frequency of Communication with Friends and Family: More than three times a week    Frequency of Social Gatherings with Friends and Family: Once a week    Attends Religious Services: More than 4 times per year    Active Member of [employer] or Organizations: No    Attends Banker Meetings: Never    Marital Status: Widowed  Intimate Partner Violence: Not At Risk (04/19/2024)   Humiliation, Afraid, Rape, and Kick questionnaire    Fear of Current or Ex-Partner: No    Emotionally Abused: No    Physically Abused: No    Sexually Abused: No     Review of Systems    General:  No chills, fever, night sweats or weight changes.  Cardiovascular:  No chest pain, dyspnea on exertion, edema, orthopnea, palpitations, paroxysmal nocturnal dyspnea. Dermatological: No rash, lesions/masses Respiratory: No cough, dyspnea Urologic: No hematuria, dysuria Abdominal:   No nausea, vomiting, diarrhea, bright red blood per rectum, melena, or hematemesis Neurologic:  No visual changes, wkns, changes in mental status. All other systems reviewed and are otherwise negative except as noted above.  Physical Exam    VS:  BP (!) 108/58   Pulse 72   Wt 157 lb (71.2 kg)   SpO2 98%   BMI 27.81 kg/m  , BMI Body mass index is 27.81 kg/m. GEN: Well nourished, well developed, in  no acute distress. HEENT: normal. Neck: Supple, no JVD, carotid bruits, or masses. Cardiac: RRR, no murmurs, rubs, or gallops. No clubbing, cyanosis, edema.  Radials/DP/PT 2+ and equal  bilaterally.  Respiratory:  Respirations regular and unlabored, clear to auscultation bilaterally. GI: Soft, nontender, nondistended, BS + x 4. MS: no deformity or atrophy. Skin: warm and dry, no rash. Neuro:  Strength and sensation are intact. Psych: Normal affect.  Accessory Clinical Findings    Recent Labs: 06/25/2023: BNP 352.7 10/30/2023: TSH 0.72 04/22/2024: ALT 53; Hemoglobin 9.7; Magnesium  2.4; Platelets 178 04/23/2024: BUN 30; Creatinine, Ser 0.76; Potassium 4.5; Sodium 137   Recent Lipid Panel    Component Value Date/Time   CHOL 207 (H) 10/30/2023 1036   TRIG 89.0 10/30/2023 1036   HDL 70.10 10/30/2023 1036   CHOLHDL 3 10/30/2023 1036   VLDL 17.8 10/30/2023 1036   LDLCALC 119 (H) 10/30/2023 1036         ECG personally reviewed by me today- EKG Interpretation Date/Time:  Friday April 25 2024 10:11:36 EDT Ventricular Rate:  73 PR Interval:    QRS Duration:  150 QT Interval:  430 QTC Calculation: 473 R Axis:   19  Text Interpretation: Atrial fibrillation Right bundle branch block T wave abnormality, consider inferior ischemia When compared with ECG of 25-Apr-2024 10:11, No significant change was found Confirmed by Emelia Hazy [phone]) on 04/25/2024 10:17:56 AM   Echocardiogram 04/20/2024   IMPRESSIONS     1. Left ventricular ejection fraction, by estimation, is 50 to 55%. The  left ventricle has low normal function. The left ventricle has no regional  wall motion abnormalities. There is moderate left ventricular hypertrophy.  Left ventricular diastolic  parameters are indeterminate.   2. Right ventricular systolic function is normal. The right ventricular  size is normal. There is moderately elevated pulmonary artery systolic  pressure.   3. Left atrial size was moderately dilated.   4. Right atrial size was moderately dilated.   5. The mitral valve is abnormal. Mild mitral valve regurgitation.  Moderate mitral annular calcification.   6. Tricuspid valve  regurgitation is moderate.   7. Biprothetic AVR stable mean gradient mild AR no PVL no details in Epic  on size/type of valve. The aortic valve has been repaired/replaced. Aortic  valve regurgitation is mild. There is a bioprosthetic valve present in the  aortic position.   8. The inferior vena cava is dilated in size with >50% respiratory  variability, suggesting right atrial pressure of 8 mmHg.   FINDINGS   Left Ventricle: Left ventricular ejection fraction, by estimation, is 50  to 55%. The left ventricle has low normal function. The left ventricle has  no regional wall motion abnormalities. Strain was performed and the global  longitudinal strain is  indeterminate. The left ventricular internal cavity size was normal in  size. There is moderate left ventricular hypertrophy. Left ventricular  diastolic parameters are indeterminate.   Right Ventricle: The right ventricular size is normal. No increase in  right ventricular wall thickness. Right ventricular systolic function is  normal. There is moderately elevated pulmonary artery systolic pressure.  The tricuspid regurgitant velocity is  3.45 m/s, and with an assumed right atrial pressure of 8 mmHg, the  estimated right ventricular systolic pressure is 55.6 mmHg.   Left Atrium: Left atrial size was moderately dilated.   Right Atrium: Right atrial size was moderately dilated.   Pericardium: There is no evidence of pericardial effusion.   Mitral Valve: The mitral valve is  abnormal. There is moderate thickening  of the mitral valve leaflet(s). There is mild calcification of the mitral  valve leaflet(s). Moderate mitral annular calcification. Mild mitral valve  regurgitation. MV peak gradient,   6.9 mmHg. The mean mitral valve gradient is 3.0 mmHg.   Tricuspid Valve: The tricuspid valve is grossly normal. Tricuspid valve  regurgitation is moderate.   Aortic Valve: Biprothetic AVR stable mean gradient mild AR no PVL no  details  in Epic on size/type of valve. The aortic valve has been  repaired/replaced. Aortic valve regurgitation is mild. Aortic  regurgitation PHT measures 787 msec. Aortic valve mean  gradient measures 16.0 mmHg. Aortic valve peak gradient measures 29.7  mmHg. Aortic valve area, by VTI measures 1.42 cm. There is a  bioprosthetic valve present in the aortic position.   Pulmonic Valve: The pulmonic valve was grossly normal. Pulmonic valve  regurgitation is trivial.   Aorta: The aortic root is normal in size and structure.   Venous: The inferior vena cava is dilated in size with greater than 50%  respiratory variability, suggesting right atrial pressure of 8 mmHg.   IAS/Shunts: No atrial level shunt detected by color flow Doppler.      Assessment & Plan   1.  Syncope-denies further episodes of lightheadedness, presyncope or syncope.  Fell at home and was admitted to the hospital for evaluation 8/23.  Felt to be deconditioned.  Previous cardiac event monitor showed multiple pauses greater than 3 seconds.  He was offered PPM but declined. Agree with physical therapy recommendation. Increase physical activity as tolerated  Chronic diastolic CHF-euvolemic.  Well compensated.  Weight stable. Heart healthy low-sodium diet Continue current medical therapy Increase physical activity as tolerated  Essential hypertension-BP today 108/58. Maintain blood pressure log Continue amlodipine  Heart healthy low-sodium diet  Atrial fibrillation-CHA2DS2-VASc score 5.  Denies bleeding issues.  Reports compliance with warfarin.  INR goal 2-3.  Discussed Watchman device.  Patient  wishes to think about procedure and will contact office if he wishes to proceed. Continue warfarin  Aortic stenosis-status post AVR.  Echocardiogram 04/20/2024 showed bioprosthetic AVR with stable mean gradient and mild AR no PVL. Continue SBE prophylaxis  Disposition: Follow-up with Dr. Pietro or me in 3-4 months.   Josefa HERO.  Benney Sommerville NP-C     04/25/2024, 10:40 AM [facility] [address] [place], [place] [address] Office [phone]      I spent 14 minutes examining this patient, reviewing medications, and using patient centered shared decision making involving their cardiac care.   I spent  20 minutes reviewing past medical history,  medications, and prior cardiac tests.

## 2024-04-23 NOTE — TOC Transition Note (Addendum)
 Transition of Care [facility]) - Discharge Note   Patient Details  Name: [name] MRN: [id number] Date of Birth: [birth year]  [facility] CM/SW Contact:  Sheri ONEIDA Sharps, LCSW Phone Number: 04/23/2024, 11:30 AM   Clinical Narrative:    Pt medically ready to dc to [facility]. Call report and room number given to nurse. DC packet w/ signed scripts left a nurses station. [facility] called at 11:45am. No further TOC needs.   Final next level of care: Skilled Nursing Facility Barriers to Discharge: Barriers Resolved   Patient Goals and CMS Choice Patient states their goals for this hospitalization and ongoing recovery are:: return home following STR CMS Medicare.gov Compare Post Acute Care list provided to:: Patient Choice offered to / list presented to : Patient [place] ownership interest in [facility].provided to:: Patient    Discharge Placement PASRR number recieved: 04/21/24            Patient chooses bed at: [facility] Patient to be transferred to facility by: [facility] Name of family member notified: Jori Pool (Daughter)  [phone] Patient and family notified of of transfer: 04/23/24  Discharge Plan and Services Additional resources added to the After Visit Summary for   In-house Referral: NA Discharge Planning Services: NA Post Acute Care Choice: Skilled Nursing Facility          DME Arranged: N/A DME Agency: NA       HH Arranged: NA HH Agency: NA        Social Drivers of Health (SDOH) Interventions SDOH Screenings   Food Insecurity: No Food Insecurity (04/19/2024)  Housing: Unknown (04/19/2024)  Transportation Needs: No Transportation Needs (04/19/2024)  Utilities: Not At Risk (04/19/2024)  Alcohol Screen: Low Risk  (10/30/2023)  Depression (PHQ2-9): Low Risk  (10/30/2023)  Financial Resource Strain: Low Risk  (10/30/2023)  Physical Activity: Insufficiently Active (10/30/2023)  Social Connections: Moderately Isolated (04/19/2024)   Stress: No Stress Concern Present (10/30/2023)  Tobacco Use: Medium Risk (04/19/2024)  Health Literacy: Adequate Health Literacy (10/30/2023)     Readmission Risk Interventions     No data to display

## 2024-04-24 ENCOUNTER — Telehealth: Payer: Self-pay

## 2024-04-24 DIAGNOSIS — S8000XA Contusion of unspecified knee, initial encounter: Secondary | ICD-10-CM | POA: Diagnosis not present

## 2024-04-24 DIAGNOSIS — S81802A Unspecified open wound, left lower leg, initial encounter: Secondary | ICD-10-CM | POA: Diagnosis not present

## 2024-04-24 DIAGNOSIS — R5381 Other malaise: Secondary | ICD-10-CM | POA: Diagnosis not present

## 2024-04-24 DIAGNOSIS — I482 Chronic atrial fibrillation, unspecified: Secondary | ICD-10-CM | POA: Diagnosis not present

## 2024-04-24 DIAGNOSIS — I10 Essential (primary) hypertension: Secondary | ICD-10-CM | POA: Diagnosis not present

## 2024-04-24 DIAGNOSIS — I5032 Chronic diastolic (congestive) heart failure: Secondary | ICD-10-CM | POA: Diagnosis not present

## 2024-04-24 NOTE — Telephone Encounter (Signed)
 Received call from Director of [facility], Crystal Vanbenc. She reports pt was transferred to their facility yesterday due to fall, wounds and PT, after a stay in the hospital. She is wanting to assure his warfarin dosing is correct and is inquiring what the pt was taking before the hospitalization.  Advised pt was seen on 8/18 and INR was in range. Advised of range as 2.0-2.5.  Advised at that time the pt was taking 2.5 mg daily. Advised pt has been very labile with his INR in the last several months so close monitoring is advised.   She will relay this information to their NP and they will meet in the morning. She will f/u with the coumadin  clinic tomorrow afternoon.   Direct number for Crystal is [phone]

## 2024-04-25 ENCOUNTER — Telehealth: Payer: Self-pay | Admitting: General Practice

## 2024-04-25 ENCOUNTER — Encounter: Payer: Self-pay | Admitting: General Practice

## 2024-04-25 ENCOUNTER — Ambulatory Visit: Attending: General Practice | Admitting: General Practice

## 2024-04-25 VITALS — BP 108/58 | HR 72 | Wt 157.0 lb

## 2024-04-25 DIAGNOSIS — J432 Centrilobular emphysema: Secondary | ICD-10-CM | POA: Diagnosis not present

## 2024-04-25 DIAGNOSIS — I4821 Permanent atrial fibrillation: Secondary | ICD-10-CM

## 2024-04-25 DIAGNOSIS — R55 Syncope and collapse: Secondary | ICD-10-CM | POA: Diagnosis not present

## 2024-04-25 DIAGNOSIS — R5381 Other malaise: Secondary | ICD-10-CM | POA: Diagnosis not present

## 2024-04-25 DIAGNOSIS — I5032 Chronic diastolic (congestive) heart failure: Secondary | ICD-10-CM

## 2024-04-25 DIAGNOSIS — I10 Essential (primary) hypertension: Secondary | ICD-10-CM | POA: Diagnosis not present

## 2024-04-25 DIAGNOSIS — I350 Nonrheumatic aortic (valve) stenosis: Secondary | ICD-10-CM | POA: Diagnosis not present

## 2024-04-25 DIAGNOSIS — Z952 Presence of prosthetic heart valve: Secondary | ICD-10-CM | POA: Diagnosis not present

## 2024-04-25 DIAGNOSIS — M109 Gout, unspecified: Secondary | ICD-10-CM | POA: Diagnosis not present

## 2024-04-25 DIAGNOSIS — D508 Other iron deficiency anemias: Secondary | ICD-10-CM | POA: Diagnosis not present

## 2024-04-25 DIAGNOSIS — I482 Chronic atrial fibrillation, unspecified: Secondary | ICD-10-CM | POA: Diagnosis not present

## 2024-04-25 NOTE — Telephone Encounter (Signed)
 Error

## 2024-04-25 NOTE — Telephone Encounter (Signed)
 [name] called coumadin  clinic again today with update. Pt is doing well and able to sit on the side of the bed already. He is asking for assistance when walking. The pt's team there are concerned pt was d/c too soon for the injury he has. They are going to provide consistent wound care. They are working with him for PT and will keep the coumadin  clinic updated on progress. Advised the coumadin  clinic appreciates the update and if anything is needed to feel free to contact the coumadin  clinic. [name] verbalized understanding.

## 2024-04-25 NOTE — Patient Instructions (Signed)
 Medication Instructions:  Your physician recommends that you continue on your current medications as directed. Please refer to the Current Medication list given to you today.  *If you need a refill on your cardiac medications before your next appointment, please call your pharmacy*  Lab Work: NONE If you have labs (blood work) drawn today and your tests are completely normal, you will receive your results only by: MyChart Message (if you have MyChart) OR A paper copy in the mail If you have any lab test that is abnormal or we need to change your treatment, we will call you to review the results.  Testing/Procedures: NONE  Follow-Up: At [facility], you and your health needs are our priority.  As part of our continuing mission to provide you with exceptional heart care, our providers are all part of one team.  This team includes your primary Cardiologist (physician) and Advanced Practice Providers or APPs (Physician Assistants and Nurse Practitioners) who all work together to provide you with the care you need, when you need it.  Your next appointment:   3-4 month(s)  Provider:   Redell Shallow, MD or Noemi Beauvais, NP  We recommend signing up for the patient portal called MyChart.  Sign up information is provided on this After Visit Summary.  MyChart is used to connect with patients for Virtual Visits (Telemedicine).  Patients are able to view lab/test results, encounter notes, upcoming appointments, etc.  Non-urgent messages can be sent to your provider as well.   To learn more about what you can do with MyChart, go to [email or web address].   Other Instructions Your provider recommends that you continue physical therapy and call our office if you want to move forward with watchman

## 2024-04-29 DIAGNOSIS — S51001A Unspecified open wound of right elbow, initial encounter: Secondary | ICD-10-CM | POA: Diagnosis not present

## 2024-04-29 DIAGNOSIS — S61402A Unspecified open wound of left hand, initial encounter: Secondary | ICD-10-CM | POA: Diagnosis not present

## 2024-04-29 DIAGNOSIS — S81002A Unspecified open wound, left knee, initial encounter: Secondary | ICD-10-CM | POA: Diagnosis not present

## 2024-04-29 DIAGNOSIS — S81802A Unspecified open wound, left lower leg, initial encounter: Secondary | ICD-10-CM | POA: Diagnosis not present

## 2024-05-02 DIAGNOSIS — R609 Edema, unspecified: Secondary | ICD-10-CM | POA: Diagnosis not present

## 2024-05-02 DIAGNOSIS — I482 Chronic atrial fibrillation, unspecified: Secondary | ICD-10-CM | POA: Diagnosis not present

## 2024-05-02 DIAGNOSIS — I5032 Chronic diastolic (congestive) heart failure: Secondary | ICD-10-CM | POA: Diagnosis not present

## 2024-05-02 DIAGNOSIS — I10 Essential (primary) hypertension: Secondary | ICD-10-CM | POA: Diagnosis not present

## 2024-05-05 DIAGNOSIS — R609 Edema, unspecified: Secondary | ICD-10-CM | POA: Diagnosis not present

## 2024-05-05 DIAGNOSIS — Z7901 Long term (current) use of anticoagulants: Secondary | ICD-10-CM | POA: Diagnosis not present

## 2024-05-05 DIAGNOSIS — I5032 Chronic diastolic (congestive) heart failure: Secondary | ICD-10-CM | POA: Diagnosis not present

## 2024-05-05 DIAGNOSIS — I10 Essential (primary) hypertension: Secondary | ICD-10-CM | POA: Diagnosis not present

## 2024-05-05 DIAGNOSIS — I482 Chronic atrial fibrillation, unspecified: Secondary | ICD-10-CM | POA: Diagnosis not present

## 2024-05-05 NOTE — Telephone Encounter (Signed)
 Crystal, Interior and spatial designer at [facility], reports pt will d/c facility tomorrow. [phone] She reports pt is doing very well. Pt's current warfarin dosing;  3 mg daily except take 5 mg on Wednesday.   Advised should check INR on Friday, 9/12, at [facility]. Scheduled pt for coumadin  clinic apt on 9/12 for 1 pm. Advised if any difficulty with the time of the apt to have pt contact the coumadin  clinic. Crystal verbalized understanding and reports she will tell pt and his daughter about the apt as soon as she gets off of the phone.

## 2024-05-07 ENCOUNTER — Telehealth: Payer: Self-pay

## 2024-05-07 ENCOUNTER — Telehealth: Payer: Self-pay | Admitting: *Deleted

## 2024-05-07 NOTE — Transitions of Care (Post Inpatient/ED Visit) (Unsigned)
   05/07/2024  Name: [name] MRN: [id number] DOB: 05-13-1936  Today's TOC FU Call Status: Today's TOC FU Call Status:: Unsuccessful Call (1st Attempt) Unsuccessful Call (1st Attempt) Date: 05/07/24  Attempted to reach the patient regarding the most recent Inpatient/ED visit.  Follow Up Plan: Additional outreach attempts will be made to reach the patient to complete the Transitions of Care (Post Inpatient/ED visit) call.   Signature Julian Lemmings, LPN [facility] Nurse Health Advisor Direct Dial [phone]

## 2024-05-07 NOTE — Telephone Encounter (Signed)
 Copied from CRM [id number]. Topic: Clinical - Medical Advice >> May 07, 2024  8:54 AM Anairis L wrote: Reason for CRM: Mr.Hoelzel daughter is returning nurse call.

## 2024-05-08 NOTE — Transitions of Care (Post Inpatient/ED Visit) (Signed)
 05/08/2024  Name: [name] MRN: [id number] DOB: 1936/04/11  Today's TOC FU Call Status: Today's TOC FU Call Status:: Successful TOC FU Call Completed Unsuccessful Call (1st Attempt) Date: 05/07/24 [facility] FU Call Complete Date: 05/08/24 Patient's Name and Date of Birth confirmed.  Transition Care Management Follow-up Telephone Call Date of Discharge: 05/06/24 Discharge Facility: Other [facility]) Name of Other ([facility]) Discharge Facility: [facility] Type of Discharge: Inpatient Admission Primary Inpatient Discharge Diagnosis:: fall How have you been since you were released from the hospital?: Better Any questions or concerns?: No  Items Reviewed: Did you receive and understand the discharge instructions provided?: Yes Medications obtained,verified, and reconciled?: Yes (Medications Reviewed) Any new allergies since your discharge?: No Dietary orders reviewed?: Yes Do you have support at home?: Yes People in Home [RPT]: child(ren), adult  Medications Reviewed Today: Medications Reviewed Today     Reviewed by Emmitt Pan, LPN (Licensed Practical Nurse) on 05/08/24 at [phone]  Med List Status: <None>   Medication Order Taking? Sig Documenting Provider Last Dose Status Informant  allopurinol  (ZYLOPRIM ) 300 MG tablet [id number] Yes Take 1 tablet by mouth once daily Theophilus Andrews, Tully GRADE, MD  Active Self, Pharmacy Records  amLODipine  (NORVASC ) 5 MG tablet [id number] Yes Take 1 tablet by mouth once daily Theophilus Andrews, Tully GRADE, MD  Active Self, Pharmacy Records  benzonatate  (TESSALON ) 100 MG capsule [id number] Yes Take 1 capsule (100 mg total) by mouth 3 (three) times daily as needed for cough. Pokhrel, Laxman, MD  Active   cyanocobalamin  (VITAMIN B12) 500 MCG tablet [id number] Yes Take 1 tablet (500 mcg total) by mouth daily.  Patient taking differently: Take 500 mcg by mouth daily. BID   Regalado, Belkys A, MD  Active Self, Pharmacy Records  feeding supplement  (ENSURE PLUS HIGH PROTEIN) LIQD [id number] Yes Take 237 mLs by mouth 2 (two) times daily between meals. Pokhrel, Laxman, MD  Active   furosemide  (LASIX ) 20 MG tablet [id number] Yes Take 1 tablet (20 mg total) by mouth daily after breakfast. Pokhrel, Laxman, MD  Active   ipratropium-albuterol  (DUONEB) 0.5-2.5 (3) MG/3ML SOLN [id number] Yes Take 3 mLs by nebulization every 3 (three) hours as needed. Pokhrel, Laxman, MD  Active   mupirocin  ointment (BACTROBAN ) 2 % [id number] Yes Apply topically 2 (two) times daily. Pokhrel, Laxman, MD  Active   ondansetron  (ZOFRAN ) 4 MG tablet [id number] Yes Take 1 tablet (4 mg total) by mouth every 6 (six) hours as needed for nausea. Pokhrel, Laxman, MD  Active   potassium chloride  SA (KLOR-CON  M) 20 MEQ tablet [id number] Yes Take 1 tablet (20 mEq total) by mouth daily.  Patient taking differently: Take 20 mEq by mouth in the morning.   Pietro Redell RAMAN, MD  Active Self, Pharmacy Records           Med Note [facility] P[facility]ING A   Dju Apr 19, 2024  2:50 PM)    predniSONE  (DELTASONE ) 20 MG tablet [id number] Yes Take 1 tablet by mouth once daily with breakfast Theophilus Andrews, Tully GRADE, MD  Active Self, Pharmacy Records  tamsulosin  (FLOMAX ) 0.4 MG CAPS capsule [id number] Yes Take 1 capsule (0.4 mg total) by mouth daily after supper. Pokhrel, Laxman, MD  Active   traMADol  (ULTRAM ) 50 MG tablet [id number] Yes Take 1 tablet (50 mg total) by mouth every 12 (twelve) hours as needed. Pokhrel, Vernal, MD  Active   traZODone  (DESYREL ) 50 MG tablet [id number] Yes TAKE 1 TABLET BY MOUTH AT BEDTIME AS NEEDED  Patient taking  differently: Take 50 mg by mouth at bedtime as needed for sleep.   Theophilus Andrews, Tully GRADE, MD  Active Self, Pharmacy Records  warfarin (COUMADIN ) 5 MG tablet [id number] Yes TAKE 1/2 (ONE-HALF) A TABLET BY MOUTH ONCE DAILY EXCEPT  TAKE 1 TABLET ON WEDNESDAY AS DIRECTED  BY  ANTICOAGULATION  CLINIC  Patient taking differently: Take 2.5 mg by mouth daily. TAKE 1/2  (ONE-HALF) A TABLET BY MOUTH ONCE DAILY EXCEPT  TAKE 1 TABLET ON WEDNESDAY AS DIRECTED  BY  ANTICOAGULATION  CLINIC   Theophilus Andrews, Tully GRADE, MD  Active Self, Pharmacy Records            Home Care and Equipment/Supplies: Were Home Health Services Ordered?: Yes Name of Home Health Agency:: unknown Has Agency set up a time to come to your home?: No Any new equipment or medical supplies ordered?: NA  Functional Questionnaire: Do you need assistance with bathing/showering or dressing?: Yes Do you need assistance with meal preparation?: No Do you need assistance with eating?: No Do you have difficulty maintaining continence: No Do you need assistance with getting out of bed/getting out of a chair/moving?: No Do you have difficulty managing or taking your medications?: No  Follow up appointments reviewed: PCP Follow-up appointment confirmed?: Yes Date of PCP follow-up appointment?: 05/09/24 Follow-up Provider: [facility] Follow-up appointment confirmed?: NA Do you need transportation to your follow-up appointment?: No Do you understand care options if your condition(s) worsen?: Yes-patient verbalized understanding    SIGNATURE Julian Lemmings, LPN [facility] Nurse Health Advisor Direct Dial [phone]

## 2024-05-09 ENCOUNTER — Ambulatory Visit (INDEPENDENT_AMBULATORY_CARE_PROVIDER_SITE_OTHER)

## 2024-05-09 DIAGNOSIS — Z7901 Long term (current) use of anticoagulants: Secondary | ICD-10-CM | POA: Diagnosis not present

## 2024-05-09 LAB — POCT INR: INR: 1.4 — AB (ref 2.0–3.0)

## 2024-05-09 NOTE — Telephone Encounter (Signed)
 Pt was in coumadin  clinic today and INR was subtherapeutic. Pt verified his warfarin dose. Wanted to assure this is what pt was given in [facility] SNF.   LVM for Crystal, Director at [facility], to verify what warfarin dose pt was taking in facility and what instructions for dosing he was given on discharge.

## 2024-05-09 NOTE — Patient Instructions (Addendum)
 Pre visit review using our clinic review tool, if applicable. No additional management support is needed unless otherwise documented below in the visit note.  Increase dose today to take 1 tablet and then continue 1/2 tablet daily. Recheck in 2 week.

## 2024-05-09 NOTE — Progress Notes (Addendum)
 Pt in hospital from 8/23-8/27 for a fall. He was d/c to rehab facility.  Increase dose today to take 1 tablet and then continue 1/2 tablet daily. Recheck in 2 week.    LVM for Crystal, Director at [facility], to verify what warfarin dose pt was taking in facility and what instructions for dosing he was given on discharge.

## 2024-05-14 DIAGNOSIS — M1990 Unspecified osteoarthritis, unspecified site: Secondary | ICD-10-CM | POA: Diagnosis not present

## 2024-05-14 DIAGNOSIS — I4821 Permanent atrial fibrillation: Secondary | ICD-10-CM | POA: Diagnosis not present

## 2024-05-14 DIAGNOSIS — S51811D Laceration without foreign body of right forearm, subsequent encounter: Secondary | ICD-10-CM | POA: Diagnosis not present

## 2024-05-14 DIAGNOSIS — S81811D Laceration without foreign body, right lower leg, subsequent encounter: Secondary | ICD-10-CM | POA: Diagnosis not present

## 2024-05-14 DIAGNOSIS — E538 Deficiency of other specified B group vitamins: Secondary | ICD-10-CM | POA: Diagnosis not present

## 2024-05-14 DIAGNOSIS — D509 Iron deficiency anemia, unspecified: Secondary | ICD-10-CM | POA: Diagnosis not present

## 2024-05-14 DIAGNOSIS — Z9181 History of falling: Secondary | ICD-10-CM | POA: Diagnosis not present

## 2024-05-14 DIAGNOSIS — I5032 Chronic diastolic (congestive) heart failure: Secondary | ICD-10-CM | POA: Diagnosis not present

## 2024-05-14 DIAGNOSIS — M109 Gout, unspecified: Secondary | ICD-10-CM | POA: Diagnosis not present

## 2024-05-14 DIAGNOSIS — J432 Centrilobular emphysema: Secondary | ICD-10-CM | POA: Diagnosis not present

## 2024-05-14 DIAGNOSIS — Z7901 Long term (current) use of anticoagulants: Secondary | ICD-10-CM | POA: Diagnosis not present

## 2024-05-14 DIAGNOSIS — Z87891 Personal history of nicotine dependence: Secondary | ICD-10-CM | POA: Diagnosis not present

## 2024-05-14 DIAGNOSIS — I110 Hypertensive heart disease with heart failure: Secondary | ICD-10-CM | POA: Diagnosis not present

## 2024-05-15 ENCOUNTER — Encounter: Payer: Self-pay | Admitting: Internal Medicine

## 2024-05-15 ENCOUNTER — Telehealth (INDEPENDENT_AMBULATORY_CARE_PROVIDER_SITE_OTHER): Admitting: Internal Medicine

## 2024-05-15 VITALS — Wt 164.0 lb

## 2024-05-15 DIAGNOSIS — Z09 Encounter for follow-up examination after completed treatment for conditions other than malignant neoplasm: Secondary | ICD-10-CM | POA: Diagnosis not present

## 2024-05-15 DIAGNOSIS — W19XXXD Unspecified fall, subsequent encounter: Secondary | ICD-10-CM | POA: Diagnosis not present

## 2024-05-15 DIAGNOSIS — R531 Weakness: Secondary | ICD-10-CM | POA: Diagnosis not present

## 2024-05-15 NOTE — Progress Notes (Signed)
 Virtual Visit via Video Note  I connected with [name] on 05/15/24 at  8:30 AM EDT by a video enabled telemedicine application and verified that I am speaking with the correct person using two identifiers.  Location patient: home Location provider: work office Persons participating in the virtual visit: patient, provider  I discussed the limitations of evaluation and management by telemedicine and the availability of in person appointments. The patient expressed understanding and agreed to proceed.   HPI: This virtual visit was scheduled for hospital follow-up.  He was hospitalized from August 23 through August 27 after he experienced a fall at home and severe generalized weakness.  No fractures were identified, no electrolyte abnormalities.  He was sent to SNF for rehab.  He is now back home.  He had a visit with PT and OT yesterday.  Daughter is asking that we had a home health RN as he had some significant skin tears and wounds on his arm following the fall given that he is anticoagulated.   ROS: Negative unless indicated in HPI.  Past Medical History:  Diagnosis Date   Aortic stenosis    Atrial fibrillation (HCC)    Baker cyst    Left knee   DJD (degenerative joint disease)    knees   Dysrhythmia    Eczema    Erectile dysfunction    Gout    History of hiatal hernia    History of radiation therapy    Right lung- 04/21/21-05/03/21 Dr. Lynwood Nasuti   HTN (hypertension)    MVP (mitral valve prolapse)    Shortness of breath dyspnea    with activity    Past Surgical History:  Procedure Laterality Date    polyps vocal cord  1994   AORTIC VALVE REPLACEMENT  ~2004   BRONCHIAL BIOPSY  03/07/2021   Procedure: BRONCHIAL BIOPSIES;  Surgeon: Shelah Lamar RAMAN, MD;  Location: [facility] ENDOSCOPY;  Service: Pulmonary;;   BRONCHIAL BIOPSY  11/14/2021   Procedure: BRONCHIAL BIOPSIES;  Surgeon: Shelah Lamar RAMAN, MD;  Location: [facility] ENDOSCOPY;  Service: Pulmonary;;   BRONCHIAL  BRUSHINGS  03/07/2021   Procedure: BRONCHIAL BRUSHINGS;  Surgeon: Shelah Lamar RAMAN, MD;  Location: [facility] ENDOSCOPY;  Service: Pulmonary;;   BRONCHIAL BRUSHINGS  11/14/2021   Procedure: BRONCHIAL BRUSHINGS;  Surgeon: Shelah Lamar RAMAN, MD;  Location: [facility] ENDOSCOPY;  Service: Pulmonary;;   BRONCHIAL NEEDLE ASPIRATION BIOPSY  03/07/2021   Procedure: BRONCHIAL NEEDLE ASPIRATION BIOPSIES;  Surgeon: Shelah Lamar RAMAN, MD;  Location: [facility] ENDOSCOPY;  Service: Pulmonary;;   BRONCHIAL NEEDLE ASPIRATION BIOPSY  11/14/2021   Procedure: BRONCHIAL NEEDLE ASPIRATION BIOPSIES;  Surgeon: Shelah Lamar RAMAN, MD;  Location: [facility] ENDOSCOPY;  Service: Pulmonary;;   BRONCHIAL WASHINGS  09/21/2020   Procedure: BRONCHIAL WASHINGS;  Surgeon: Kara Dorn NOVAK, MD;  Location: [facility] ENDOSCOPY;  Service: Pulmonary;;  BAL    BRONCHIAL WASHINGS  03/07/2021   Procedure: BRONCHIAL WASHINGS;  Surgeon: Shelah Lamar RAMAN, MD;  Location[facility] ENDOSCOPY;  Service: Pulmonary;;   BRONCHIAL WASHINGS  11/14/2021   Procedure: BRONCHIAL WASHINGS;  Surgeon: Shelah Lamar RAMAN, MD;  Location: [facility] ENDOSCOPY;  Service: Pulmonary;;   FIDUCIAL MARKER PLACEMENT  03/07/2021   Procedure: FIDUCIAL MARKER PLACEMENT;  Surgeon: Shelah Lamar RAMAN, MD;  Location: [facility] ENDOSCOPY;  Service: Pulmonary;;   HERNIA REPAIR  [phone]   HERNIA REPAIR  ~1983   IR ANGIOGRAM PELVIS SELECTIVE OR SUPRASELECTIVE  12/01/2022   IR ANGIOGRAM PELVIS SELECTIVE OR SUPRASELECTIVE  12/01/2022   IR ANGIOGRAM SELECTIVE  EACH ADDITIONAL VESSEL  12/01/2022   IR ANGIOGRAM SELECTIVE EACH ADDITIONAL VESSEL  12/01/2022   IR CT SPINE LTD  12/01/2022   IR CT SPINE LTD  12/01/2022   IR CT SPINE LTD  12/01/2022   IR EMBO ARTERIAL NOT HEMORR HEMANG INC GUIDE ROADMAPPING  12/01/2022   IR EMBO TUMOR ORGAN ISCHEMIA INFARCT INC GUIDE ROADMAPPING  12/01/2022   IR RADIOLOGIST EVAL & MGMT  11/17/2022   IR US  GUIDE VASC ACCESS LEFT  12/01/2022   IR US  GUIDE VASC ACCESS RIGHT  12/01/2022   LUMBAR LAMINECTOMY/DECOMPRESSION MICRODISCECTOMY N/A  01/15/2015   Procedure: 2 LEVEL DECOMPRESSIVE LUMBAR LAMINECTOMY L3-L4,L4-L5;  Surgeon: Tanda Heading, MD;  Location: [facility] ORS;  Service: Orthopedics;  Laterality: N/A;   LUMBAR LAMINECTOMY/DECOMPRESSION MICRODISCECTOMY N/A 05/01/2017   Procedure: Lumbar one-Lumbar two, Lumbar two-Lumbar three, Lumbar three-Lumbar four Laminectomy; Evacuation of hematoma;  Surgeon: Lanis Pupa, MD;  Location: [facility] OR;  Service: Neurosurgery;  Laterality: N/A;   right total hip  ~4 years ago   ROTATOR CUFF REPAIR Left ~2009   TOTAL KNEE ARTHROPLASTY Left 04/14/2015   Procedure: LEFT TOTAL KNEE ARTHROPLASTY;  Surgeon: Tanda Heading, MD;  Location: [facility] ORS;  Service: Orthopedics;  Laterality: Left;   VIDEO BRONCHOSCOPY N/A 09/21/2020   Procedure: VIDEO BRONCHOSCOPY WITHOUT FLUORO;  Surgeon: Kara Dorn NOVAK, MD;  Location: [facility] ENDOSCOPY;  Service: Pulmonary;  Laterality: N/A;   VIDEO BRONCHOSCOPY WITH ENDOBRONCHIAL NAVIGATION Right 03/07/2021   Procedure: VIDEO BRONCHOSCOPY WITH ENDOBRONCHIAL NAVIGATION;  Surgeon: Shelah Lamar RAMAN, MD;  Location: [facility] ENDOSCOPY;  Service: Pulmonary;  Laterality: Right;   VIDEO BRONCHOSCOPY WITH RADIAL ENDOBRONCHIAL ULTRASOUND  11/14/2021   Procedure: VIDEO BRONCHOSCOPY WITH RADIAL ENDOBRONCHIAL ULTRASOUND;  Surgeon: Shelah Lamar RAMAN, MD;  Location: [facility] ENDOSCOPY;  Service: Pulmonary;;    Family History  Problem Relation Age of Onset   Diabetes Other    Stroke Other     SOCIAL HX:   reports that he quit smoking about 21 years ago. His smoking use included cigarettes. He started smoking about 51 years ago. He has never used smokeless tobacco. He reports current alcohol use of about 3.0 standard drinks of alcohol per week. He reports that he does not use drugs.   Current Outpatient Medications:    allopurinol  (ZYLOPRIM ) 300 MG tablet, Take 1 tablet by mouth once daily, Disp: 90 tablet, Rfl: 1   amLODipine  (NORVASC ) 5 MG tablet, Take 1 tablet by mouth once daily, Disp: 90 tablet, Rfl:  1   benzonatate  (TESSALON ) 100 MG capsule, Take 1 capsule (100 mg total) by mouth 3 (three) times daily as needed for cough., Disp: , Rfl:    cyanocobalamin  (VITAMIN B12) 500 MCG tablet, Take 1 tablet (500 mcg total) by mouth daily. (Patient taking differently: Take 500 mcg by mouth daily. BID), Disp: 30 tablet, Rfl: 0   feeding supplement (ENSURE PLUS HIGH PROTEIN) LIQD, Take 237 mLs by mouth 2 (two) times daily between meals., Disp: , Rfl:    furosemide  (LASIX ) 20 MG tablet, Take 1 tablet (20 mg total) by mouth daily after breakfast., Disp: , Rfl:    ipratropium-albuterol  (DUONEB) 0.5-2.5 (3) MG/3ML SOLN, Take 3 mLs by nebulization every 3 (three) hours as needed., Disp: , Rfl:    mupirocin  ointment (BACTROBAN ) 2 %, Apply topically 2 (two) times daily., Disp: 22 g, Rfl: 0   ondansetron  (ZOFRAN ) 4 MG tablet, Take 1 tablet (4 mg total) by mouth every 6 (six) hours as needed for nausea., Disp: 20 tablet, Rfl: 0  potassium chloride  SA (KLOR-CON  M) 20 MEQ tablet, Take 1 tablet (20 mEq total) by mouth daily. (Patient taking differently: Take 20 mEq by mouth in the morning.), Disp: 90 tablet, Rfl: 3   predniSONE  (DELTASONE ) 20 MG tablet, Take 1 tablet by mouth once daily with breakfast, Disp: 90 tablet, Rfl: 0   tamsulosin  (FLOMAX ) 0.4 MG CAPS capsule, Take 1 capsule (0.4 mg total) by mouth daily after supper., Disp: , Rfl:    traMADol  (ULTRAM ) 50 MG tablet, Take 1 tablet (50 mg total) by mouth every 12 (twelve) hours as needed., Disp: 10 tablet, Rfl: 0   traZODone  (DESYREL ) 50 MG tablet, TAKE 1 TABLET BY MOUTH AT BEDTIME AS NEEDED (Patient taking differently: Take 50 mg by mouth at bedtime as needed for sleep.), Disp: 90 tablet, Rfl: 0   warfarin (COUMADIN ) 5 MG tablet, TAKE 1/2 (ONE-HALF) A TABLET BY MOUTH ONCE DAILY EXCEPT  TAKE 1 TABLET ON WEDNESDAY AS DIRECTED  BY  ANTICOAGULATION  CLINIC (Patient taking differently: Take 2.5 mg by mouth daily. TAKE 1/2 (ONE-HALF) A TABLET BY MOUTH ONCE DAILY EXCEPT   TAKE 1 TABLET ON WEDNESDAY AS DIRECTED  BY  ANTICOAGULATION  CLINIC), Disp: 75 tablet, Rfl: 0  EXAM:   VITALS per patient if applicable: None reported  GENERAL: alert, oriented, appears well and in no acute distress  HEENT: atraumatic, conjunttiva clear, no obvious abnormalities on inspection of external nose and ears  NECK: normal movements of the head and neck  LUNGS: on inspection no signs of respiratory distress, breathing rate appears normal, no obvious gross increased work of breathing, gasping or wheezing  CV: no obvious cyanosis  MS: moves all visible extremities without noticeable abnormality  PSYCH/NEURO: pleasant and cooperative, no obvious depression or anxiety, speech and thought processing grossly intact  ASSESSMENT AND PLAN:   Hospital discharge follow-up  Fall, subsequent encounter  Generalized weakness  [facility] charts reviewed in detail. - Continue to work with PT and OT, will add RN for wound care.   I discussed the assessment and treatment plan with the patient. The patient was provided an opportunity to ask questions and all were answered. The patient agreed with the plan and demonstrated an understanding of the instructions.   The patient was advised to call back or seek an in-person evaluation if the symptoms worsen or if the condition fails to improve as anticipated.    Tully Theophilus Andrews, MD  [facility]

## 2024-05-16 ENCOUNTER — Ambulatory Visit

## 2024-05-20 ENCOUNTER — Ambulatory Visit (INDEPENDENT_AMBULATORY_CARE_PROVIDER_SITE_OTHER)

## 2024-05-20 ENCOUNTER — Telehealth: Payer: Self-pay

## 2024-05-20 DIAGNOSIS — Z9181 History of falling: Secondary | ICD-10-CM | POA: Diagnosis not present

## 2024-05-20 DIAGNOSIS — I5032 Chronic diastolic (congestive) heart failure: Secondary | ICD-10-CM | POA: Diagnosis not present

## 2024-05-20 DIAGNOSIS — Z7901 Long term (current) use of anticoagulants: Secondary | ICD-10-CM | POA: Diagnosis not present

## 2024-05-20 DIAGNOSIS — J432 Centrilobular emphysema: Secondary | ICD-10-CM | POA: Diagnosis not present

## 2024-05-20 DIAGNOSIS — M109 Gout, unspecified: Secondary | ICD-10-CM | POA: Diagnosis not present

## 2024-05-20 DIAGNOSIS — S51811D Laceration without foreign body of right forearm, subsequent encounter: Secondary | ICD-10-CM | POA: Diagnosis not present

## 2024-05-20 DIAGNOSIS — Z87891 Personal history of nicotine dependence: Secondary | ICD-10-CM | POA: Diagnosis not present

## 2024-05-20 DIAGNOSIS — I110 Hypertensive heart disease with heart failure: Secondary | ICD-10-CM | POA: Diagnosis not present

## 2024-05-20 DIAGNOSIS — E538 Deficiency of other specified B group vitamins: Secondary | ICD-10-CM | POA: Diagnosis not present

## 2024-05-20 DIAGNOSIS — I4821 Permanent atrial fibrillation: Secondary | ICD-10-CM | POA: Diagnosis not present

## 2024-05-20 DIAGNOSIS — D509 Iron deficiency anemia, unspecified: Secondary | ICD-10-CM | POA: Diagnosis not present

## 2024-05-20 DIAGNOSIS — M1990 Unspecified osteoarthritis, unspecified site: Secondary | ICD-10-CM | POA: Diagnosis not present

## 2024-05-20 DIAGNOSIS — S81811D Laceration without foreign body, right lower leg, subsequent encounter: Secondary | ICD-10-CM | POA: Diagnosis not present

## 2024-05-20 LAB — POCT INR: INR: 1.9 — AB (ref 2.0–3.0)

## 2024-05-20 NOTE — Progress Notes (Signed)
 Discussed changing dose size to 2.5 mg to perform more precise dosing. Pt would like to wait at this moment. May change at next INR check.  Pt also inquired about [facility] RN order that PCP was to place after last apt. Advised a msg will be sent to CMA to f/u. Sent msg to CMA. Change weekly dose to take 1/2 tablet daily except take 1 tablet on Tuesday. Recheck in 2 week.

## 2024-05-20 NOTE — Telephone Encounter (Signed)
 Pt in today for coumadin  clinic apt and reported he had an apt with PCP and she was to place a referral for a [facility] RN. He reports he has not heard from anyone concerning that. Advised a msg will be sent to her CMA to f/u with that and you. Pt verbalized understanding and was appreciative of the help.

## 2024-05-20 NOTE — Telephone Encounter (Signed)
 [name] is waiting to hear back from [facility].

## 2024-05-20 NOTE — Patient Instructions (Addendum)
 Pre visit review using our clinic review tool, if applicable. No additional management support is needed unless otherwise documented below in the visit note.  Change weekly dose to take 1/2 tablet daily except take 1 tablet on Tuesday. Recheck in 2 week.

## 2024-05-22 DIAGNOSIS — I4821 Permanent atrial fibrillation: Secondary | ICD-10-CM | POA: Diagnosis not present

## 2024-05-22 DIAGNOSIS — S51811D Laceration without foreign body of right forearm, subsequent encounter: Secondary | ICD-10-CM | POA: Diagnosis not present

## 2024-05-22 DIAGNOSIS — I110 Hypertensive heart disease with heart failure: Secondary | ICD-10-CM | POA: Diagnosis not present

## 2024-05-22 DIAGNOSIS — S81811D Laceration without foreign body, right lower leg, subsequent encounter: Secondary | ICD-10-CM | POA: Diagnosis not present

## 2024-05-22 DIAGNOSIS — Z7901 Long term (current) use of anticoagulants: Secondary | ICD-10-CM | POA: Diagnosis not present

## 2024-05-22 DIAGNOSIS — Z9181 History of falling: Secondary | ICD-10-CM | POA: Diagnosis not present

## 2024-05-22 DIAGNOSIS — M109 Gout, unspecified: Secondary | ICD-10-CM | POA: Diagnosis not present

## 2024-05-22 DIAGNOSIS — J432 Centrilobular emphysema: Secondary | ICD-10-CM | POA: Diagnosis not present

## 2024-05-22 DIAGNOSIS — D509 Iron deficiency anemia, unspecified: Secondary | ICD-10-CM | POA: Diagnosis not present

## 2024-05-22 DIAGNOSIS — I5032 Chronic diastolic (congestive) heart failure: Secondary | ICD-10-CM | POA: Diagnosis not present

## 2024-05-22 DIAGNOSIS — E538 Deficiency of other specified B group vitamins: Secondary | ICD-10-CM | POA: Diagnosis not present

## 2024-05-22 DIAGNOSIS — M1990 Unspecified osteoarthritis, unspecified site: Secondary | ICD-10-CM | POA: Diagnosis not present

## 2024-05-22 DIAGNOSIS — Z87891 Personal history of nicotine dependence: Secondary | ICD-10-CM | POA: Diagnosis not present

## 2024-05-23 NOTE — Telephone Encounter (Signed)
 LVM inquiring if pt has been contacted by [facility] company yet. Advised of Rachel's msg. Advised to f/u with clinic if he has not been contacted yet.

## 2024-05-26 ENCOUNTER — Telehealth: Payer: Self-pay | Admitting: Internal Medicine

## 2024-05-26 ENCOUNTER — Telehealth: Payer: Self-pay

## 2024-05-26 DIAGNOSIS — S51809A Unspecified open wound of unspecified forearm, initial encounter: Secondary | ICD-10-CM

## 2024-05-26 DIAGNOSIS — W19XXXD Unspecified fall, subsequent encounter: Secondary | ICD-10-CM

## 2024-05-26 NOTE — Telephone Encounter (Signed)
 Rosaline with [facility] called asking for verbal orders for pt for nurse eval and for Wound care, called Rosaline back, she was on the other line but I spoke with [place] and informed her that this was not a patient here at our clinic.

## 2024-05-26 NOTE — Telephone Encounter (Signed)
 Copied from CRM [id number]. Topic: Medical Record Request - Provider/Facility Request >> May 26, 2024  2:48 PM Arm[place] J wrote: Reason for CRM: Rosaline is calling from [facility] asking for the patient's most recent office notes that pertain to a wound. The nurse did not specify where the wound was located.  This information is needing so they can continue with the process of skilled nursing. Once documentation is faxed over, Rosaline was wondering if approval for a nurse evaluation could be provided as well.  Fax: [phone]

## 2024-05-27 DIAGNOSIS — J432 Centrilobular emphysema: Secondary | ICD-10-CM | POA: Diagnosis not present

## 2024-05-27 DIAGNOSIS — S81811D Laceration without foreign body, right lower leg, subsequent encounter: Secondary | ICD-10-CM | POA: Diagnosis not present

## 2024-05-27 DIAGNOSIS — D509 Iron deficiency anemia, unspecified: Secondary | ICD-10-CM | POA: Diagnosis not present

## 2024-05-27 DIAGNOSIS — I110 Hypertensive heart disease with heart failure: Secondary | ICD-10-CM | POA: Diagnosis not present

## 2024-05-27 DIAGNOSIS — I4821 Permanent atrial fibrillation: Secondary | ICD-10-CM | POA: Diagnosis not present

## 2024-05-27 DIAGNOSIS — E538 Deficiency of other specified B group vitamins: Secondary | ICD-10-CM | POA: Diagnosis not present

## 2024-05-27 DIAGNOSIS — Z7901 Long term (current) use of anticoagulants: Secondary | ICD-10-CM | POA: Diagnosis not present

## 2024-05-27 DIAGNOSIS — Z9181 History of falling: Secondary | ICD-10-CM | POA: Diagnosis not present

## 2024-05-27 DIAGNOSIS — M1990 Unspecified osteoarthritis, unspecified site: Secondary | ICD-10-CM | POA: Diagnosis not present

## 2024-05-27 DIAGNOSIS — S51811D Laceration without foreign body of right forearm, subsequent encounter: Secondary | ICD-10-CM | POA: Diagnosis not present

## 2024-05-27 DIAGNOSIS — I5032 Chronic diastolic (congestive) heart failure: Secondary | ICD-10-CM | POA: Diagnosis not present

## 2024-05-27 DIAGNOSIS — Z87891 Personal history of nicotine dependence: Secondary | ICD-10-CM | POA: Diagnosis not present

## 2024-05-27 DIAGNOSIS — M109 Gout, unspecified: Secondary | ICD-10-CM | POA: Diagnosis not present

## 2024-05-28 ENCOUNTER — Telehealth: Payer: Self-pay | Admitting: Internal Medicine

## 2024-05-28 NOTE — Telephone Encounter (Signed)
 Copied from CRM [id number]. Topic: General - Other >> May 28, 2024 10:51 AM Drema MATSU wrote: Reason for CRM: [name] recieved the telemedicine note from 09/18 but no demographics. She is needing address, phone number, and insurance information.

## 2024-05-28 NOTE — Telephone Encounter (Signed)
 Records were faxed and confirmed.  Referral was placed.

## 2024-05-30 DIAGNOSIS — D509 Iron deficiency anemia, unspecified: Secondary | ICD-10-CM | POA: Diagnosis not present

## 2024-05-30 DIAGNOSIS — I4821 Permanent atrial fibrillation: Secondary | ICD-10-CM | POA: Diagnosis not present

## 2024-05-30 DIAGNOSIS — Z7901 Long term (current) use of anticoagulants: Secondary | ICD-10-CM | POA: Diagnosis not present

## 2024-05-30 DIAGNOSIS — I5032 Chronic diastolic (congestive) heart failure: Secondary | ICD-10-CM | POA: Diagnosis not present

## 2024-05-30 DIAGNOSIS — J432 Centrilobular emphysema: Secondary | ICD-10-CM | POA: Diagnosis not present

## 2024-05-30 DIAGNOSIS — Z87891 Personal history of nicotine dependence: Secondary | ICD-10-CM | POA: Diagnosis not present

## 2024-05-30 DIAGNOSIS — E538 Deficiency of other specified B group vitamins: Secondary | ICD-10-CM | POA: Diagnosis not present

## 2024-05-30 DIAGNOSIS — I110 Hypertensive heart disease with heart failure: Secondary | ICD-10-CM | POA: Diagnosis not present

## 2024-05-30 DIAGNOSIS — M109 Gout, unspecified: Secondary | ICD-10-CM | POA: Diagnosis not present

## 2024-05-30 DIAGNOSIS — S51811D Laceration without foreign body of right forearm, subsequent encounter: Secondary | ICD-10-CM | POA: Diagnosis not present

## 2024-05-30 DIAGNOSIS — S81811D Laceration without foreign body, right lower leg, subsequent encounter: Secondary | ICD-10-CM | POA: Diagnosis not present

## 2024-05-30 DIAGNOSIS — M1990 Unspecified osteoarthritis, unspecified site: Secondary | ICD-10-CM | POA: Diagnosis not present

## 2024-05-30 DIAGNOSIS — Z9181 History of falling: Secondary | ICD-10-CM | POA: Diagnosis not present

## 2024-06-02 ENCOUNTER — Ambulatory Visit (HOSPITAL_COMMUNITY)
Admission: RE | Admit: 2024-06-02 | Discharge: 2024-06-02 | Disposition: A | Discharge status: Home / Self Care | Source: Ambulatory Visit | Attending: Adult Health | Admitting: Adult Health

## 2024-06-02 DIAGNOSIS — D509 Iron deficiency anemia, unspecified: Secondary | ICD-10-CM | POA: Diagnosis not present

## 2024-06-02 DIAGNOSIS — J90 Pleural effusion, not elsewhere classified: Secondary | ICD-10-CM | POA: Diagnosis not present

## 2024-06-02 DIAGNOSIS — Z87891 Personal history of nicotine dependence: Secondary | ICD-10-CM | POA: Diagnosis not present

## 2024-06-02 DIAGNOSIS — J432 Centrilobular emphysema: Secondary | ICD-10-CM | POA: Diagnosis not present

## 2024-06-02 DIAGNOSIS — M1990 Unspecified osteoarthritis, unspecified site: Secondary | ICD-10-CM | POA: Diagnosis not present

## 2024-06-02 DIAGNOSIS — C3490 Malignant neoplasm of unspecified part of unspecified bronchus or lung: Secondary | ICD-10-CM | POA: Diagnosis not present

## 2024-06-02 DIAGNOSIS — I5032 Chronic diastolic (congestive) heart failure: Secondary | ICD-10-CM | POA: Diagnosis not present

## 2024-06-02 DIAGNOSIS — I110 Hypertensive heart disease with heart failure: Secondary | ICD-10-CM | POA: Diagnosis not present

## 2024-06-02 DIAGNOSIS — E538 Deficiency of other specified B group vitamins: Secondary | ICD-10-CM | POA: Diagnosis not present

## 2024-06-02 DIAGNOSIS — I4821 Permanent atrial fibrillation: Secondary | ICD-10-CM | POA: Diagnosis not present

## 2024-06-02 DIAGNOSIS — Z7901 Long term (current) use of anticoagulants: Secondary | ICD-10-CM | POA: Diagnosis not present

## 2024-06-02 DIAGNOSIS — Z9181 History of falling: Secondary | ICD-10-CM | POA: Diagnosis not present

## 2024-06-02 DIAGNOSIS — C3491 Malignant neoplasm of unspecified part of right bronchus or lung: Secondary | ICD-10-CM | POA: Insufficient documentation

## 2024-06-02 DIAGNOSIS — M109 Gout, unspecified: Secondary | ICD-10-CM | POA: Diagnosis not present

## 2024-06-02 DIAGNOSIS — J439 Emphysema, unspecified: Secondary | ICD-10-CM | POA: Diagnosis not present

## 2024-06-02 DIAGNOSIS — S81811D Laceration without foreign body, right lower leg, subsequent encounter: Secondary | ICD-10-CM | POA: Diagnosis not present

## 2024-06-02 DIAGNOSIS — S51811D Laceration without foreign body of right forearm, subsequent encounter: Secondary | ICD-10-CM | POA: Diagnosis not present

## 2024-06-04 DIAGNOSIS — H538 Other visual disturbances: Secondary | ICD-10-CM | POA: Diagnosis not present

## 2024-06-04 DIAGNOSIS — H25042 Posterior subcapsular polar age-related cataract, left eye: Secondary | ICD-10-CM | POA: Diagnosis not present

## 2024-06-04 DIAGNOSIS — H2513 Age-related nuclear cataract, bilateral: Secondary | ICD-10-CM | POA: Diagnosis not present

## 2024-06-04 DIAGNOSIS — H25041 Posterior subcapsular polar age-related cataract, right eye: Secondary | ICD-10-CM | POA: Diagnosis not present

## 2024-06-04 DIAGNOSIS — H25011 Cortical age-related cataract, right eye: Secondary | ICD-10-CM | POA: Diagnosis not present

## 2024-06-04 DIAGNOSIS — H43813 Vitreous degeneration, bilateral: Secondary | ICD-10-CM | POA: Diagnosis not present

## 2024-06-05 DIAGNOSIS — Z7901 Long term (current) use of anticoagulants: Secondary | ICD-10-CM | POA: Diagnosis not present

## 2024-06-05 DIAGNOSIS — I4821 Permanent atrial fibrillation: Secondary | ICD-10-CM | POA: Diagnosis not present

## 2024-06-05 DIAGNOSIS — Z87891 Personal history of nicotine dependence: Secondary | ICD-10-CM | POA: Diagnosis not present

## 2024-06-05 DIAGNOSIS — E538 Deficiency of other specified B group vitamins: Secondary | ICD-10-CM | POA: Diagnosis not present

## 2024-06-05 DIAGNOSIS — J432 Centrilobular emphysema: Secondary | ICD-10-CM | POA: Diagnosis not present

## 2024-06-05 DIAGNOSIS — M1990 Unspecified osteoarthritis, unspecified site: Secondary | ICD-10-CM | POA: Diagnosis not present

## 2024-06-05 DIAGNOSIS — S51811D Laceration without foreign body of right forearm, subsequent encounter: Secondary | ICD-10-CM | POA: Diagnosis not present

## 2024-06-05 DIAGNOSIS — Z9181 History of falling: Secondary | ICD-10-CM | POA: Diagnosis not present

## 2024-06-05 DIAGNOSIS — M109 Gout, unspecified: Secondary | ICD-10-CM | POA: Diagnosis not present

## 2024-06-05 DIAGNOSIS — D509 Iron deficiency anemia, unspecified: Secondary | ICD-10-CM | POA: Diagnosis not present

## 2024-06-05 DIAGNOSIS — I5032 Chronic diastolic (congestive) heart failure: Secondary | ICD-10-CM | POA: Diagnosis not present

## 2024-06-05 DIAGNOSIS — I110 Hypertensive heart disease with heart failure: Secondary | ICD-10-CM | POA: Diagnosis not present

## 2024-06-05 DIAGNOSIS — S81811D Laceration without foreign body, right lower leg, subsequent encounter: Secondary | ICD-10-CM | POA: Diagnosis not present

## 2024-06-06 ENCOUNTER — Ambulatory Visit: Payer: Self-pay | Admitting: Adult Health

## 2024-06-06 ENCOUNTER — Ambulatory Visit

## 2024-06-06 DIAGNOSIS — Z7901 Long term (current) use of anticoagulants: Secondary | ICD-10-CM | POA: Diagnosis not present

## 2024-06-06 DIAGNOSIS — R918 Other nonspecific abnormal finding of lung field: Secondary | ICD-10-CM

## 2024-06-06 LAB — POCT INR: INR: 2.2 (ref 2.0–3.0)

## 2024-06-06 NOTE — Progress Notes (Signed)
 Discussed changing dose size to 2.5 mg to perform more precise dosing. Pt would like to wait at this moment. May change at next INR check.  Pt has cataract surgery in Nov and Dec. Warfarin does not need held. Pt also c/o rash on wrist. Made apt with PCP.  Continue 1/2 tablet daily except take 1 tablet on Tuesday. Recheck in 4 week.

## 2024-06-06 NOTE — Patient Instructions (Addendum)
 Pre visit review using our clinic review tool, if applicable. No additional management support is needed unless otherwise documented below in the visit note.  Continue 1/2 tablet daily except take 1 tablet on Tuesday. Recheck in 4 week.

## 2024-06-08 ENCOUNTER — Other Ambulatory Visit: Payer: Self-pay | Admitting: Internal Medicine

## 2024-06-09 ENCOUNTER — Other Ambulatory Visit: Payer: Self-pay | Admitting: Internal Medicine

## 2024-06-09 ENCOUNTER — Ambulatory Visit: Admitting: Internal Medicine

## 2024-06-09 DIAGNOSIS — M109 Gout, unspecified: Secondary | ICD-10-CM | POA: Diagnosis not present

## 2024-06-09 DIAGNOSIS — Z7901 Long term (current) use of anticoagulants: Secondary | ICD-10-CM | POA: Diagnosis not present

## 2024-06-09 DIAGNOSIS — S51811D Laceration without foreign body of right forearm, subsequent encounter: Secondary | ICD-10-CM | POA: Diagnosis not present

## 2024-06-09 DIAGNOSIS — S81811D Laceration without foreign body, right lower leg, subsequent encounter: Secondary | ICD-10-CM | POA: Diagnosis not present

## 2024-06-09 DIAGNOSIS — I4821 Permanent atrial fibrillation: Secondary | ICD-10-CM | POA: Diagnosis not present

## 2024-06-09 DIAGNOSIS — E538 Deficiency of other specified B group vitamins: Secondary | ICD-10-CM | POA: Diagnosis not present

## 2024-06-09 DIAGNOSIS — I5032 Chronic diastolic (congestive) heart failure: Secondary | ICD-10-CM | POA: Diagnosis not present

## 2024-06-09 DIAGNOSIS — J432 Centrilobular emphysema: Secondary | ICD-10-CM | POA: Diagnosis not present

## 2024-06-09 DIAGNOSIS — M1990 Unspecified osteoarthritis, unspecified site: Secondary | ICD-10-CM | POA: Diagnosis not present

## 2024-06-09 DIAGNOSIS — I110 Hypertensive heart disease with heart failure: Secondary | ICD-10-CM | POA: Diagnosis not present

## 2024-06-09 DIAGNOSIS — Z87891 Personal history of nicotine dependence: Secondary | ICD-10-CM | POA: Diagnosis not present

## 2024-06-09 DIAGNOSIS — D509 Iron deficiency anemia, unspecified: Secondary | ICD-10-CM | POA: Diagnosis not present

## 2024-06-09 DIAGNOSIS — Z9181 History of falling: Secondary | ICD-10-CM | POA: Diagnosis not present

## 2024-06-10 NOTE — Progress Notes (Signed)
 ATCx1, unable to LVM as phone just kept ringing.

## 2024-06-11 NOTE — Progress Notes (Signed)
 ATCx2, LVMTCB. Sending MyChart message per protocol, as he has not been able to be reached. NFN.

## 2024-06-12 ENCOUNTER — Telehealth: Payer: Self-pay

## 2024-06-12 ENCOUNTER — Telehealth: Payer: Self-pay | Admitting: *Deleted

## 2024-06-12 NOTE — Telephone Encounter (Signed)
 Copied from CRM [id number]. Topic: Clinical - Lab/Test Results >> Jun 10, 2024  4:44 PM Rilla B wrote: Reason for CRM: Patient returning call to Meagan regarding test results. Called CAL. No one available to take call. Please call patient @ [phone].   ----------------------------------------------------------------------- From previous Reason for Contact - Other: Reason for CRM: >> Jun 11, 2024 10:28 AM Leila BROCKS wrote: Patient [phone] is returning the office call on CT scan results. Patient is leaving to [place]  today at 2 pm, if patient doesn't pick leave a message; patient does not have a cell phone. Per CAL, CMA is unavailabe send a crm. Please call back.     I called and spoke to pt. Pt informed of Tammy's note and verbalized understanding. Pt scheduled to see Dr Kara by preference in January 2026. NFN

## 2024-06-12 NOTE — Telephone Encounter (Signed)
 Copied from CRM [id number]. Topic: General - Other >> Jun 12, 2024  2:30 PM Sasha M wrote: Reason for CRM: Franky from [facility] calling to report missed  visit for today due to patient being out of town.  [phone]

## 2024-06-23 ENCOUNTER — Telehealth: Payer: Self-pay | Admitting: *Deleted

## 2024-06-23 NOTE — Telephone Encounter (Signed)
 Copied from CRM [id number]. Topic: General - Other >> Jun 20, 2024  2:50 PM Aisha D wrote: Reason for CRM: Franky a PT with [facility] wanted to inform Dr.Hernandez that the pt's daughter called to cancel their appt today due to the pt not feeling well and wanting to rest.

## 2024-06-27 ENCOUNTER — Telehealth: Payer: Self-pay | Admitting: *Deleted

## 2024-06-27 NOTE — Telephone Encounter (Signed)
 Copied from CRM #[id number]. Topic: General - Other >> Jun 27, 2024  9:57 AM Dedra NOVAK wrote: Reason for CRM: Warren from [facility] called to report a missed visit for occupational therapy. Pt had confirmed that she can come out between 9 and 9:30. Pt is not coming to the door or answering her phone calls. If any questions, Jamie can be reached at [phone].

## 2024-07-04 ENCOUNTER — Ambulatory Visit

## 2024-07-04 ENCOUNTER — Telehealth: Payer: Self-pay

## 2024-07-04 NOTE — Telephone Encounter (Signed)
 Pt called to RS his coumadin  clinic apt today due to knee pain.  RS apt for next week. Pt verbalized understanding.

## 2024-07-07 ENCOUNTER — Ambulatory Visit: Payer: Self-pay | Admitting: Pulmonary Disease

## 2024-07-07 NOTE — Telephone Encounter (Signed)
 FYI Only or Action Required?: FYI only for provider: refused ED.  Patient is followed in Pulmonology for lung nodules, last seen on 06/04/2023 by Parrett, Madelin RAMAN, NP.  Called Nurse Triage reporting Breathing Problem.  Symptoms began 2 days.  Interventions attempted: Prescription medications: tramadol .  Symptoms are: unchanged.  Triage Disposition: Go to ED Now (or PCP Triage)  Patient/caregiver understands and will follow disposition?: No, refuses disposition                            Copied from CRM [id number]. Topic: Clinical - Red Word Triage >> Jul 07, 2024  4:12 PM Rilla B wrote: [facility] that prompted transfer to Nurse Triage: Severe lung pain on right.  Reason for Disposition  Taking a deep breath makes pain worse  Answer Assessment - Initial Assessment Questions This RN advised ED. Patient declined. Patient is requesting to be seen in the office for symptoms. Please advise.   1. LOCATION: Where does it hurt?       Right lung, from top of shoulder to kidney area 2. RADIATION: Does the pain go anywhere else? (e.g., into neck, jaw, arms, back)     N/A 3. ONSET: When did the chest pain begin? (Minutes, hours or days)      2 days  4. PATTERN: Does the pain come and go, or has it been constant since it started?  Does it get worse with exertion?      Intermittent, pain happens when taking a deep breath or lifting arm up 5. DURATION: How long does it last (e.g., seconds, minutes, hours)     Depends 6. SEVERITY: How bad is the pain?  (e.g., Scale 1-10; mild, moderate, or severe)     Rates pain a 9-10 upon taking deep breath or raising right arm 7. CARDIAC RISK FACTORS: Do you have any history of heart problems or risk factors for heart disease? (e.g., angina, prior heart attack; diabetes, high blood pressure, high cholesterol, smoker, or strong family history of heart disease)     Afib and history of open heart surgery, per  patient 8. PULMONARY RISK FACTORS: Do you have any history of lung disease?  (e.g., blood clots in lung, asthma, emphysema, birth control pills)     Lung nodules, history of pneumothorax on right side, per patient 9. CAUSE: What do you think is causing the chest pain?     Unsure 10. OTHER SYMPTOMS: Do you have any other symptoms? (e.g., dizziness, nausea, vomiting, sweating, fever, difficulty breathing, cough)     SOB at baseline, states SOB is worse at times, denies dizziness, denies nausea, denies vomiting, denies fever, denies cough 11. PREGNANCY: Is there any chance you are pregnant? When was your last menstrual period?     N/A  Protocols used: Chest Pain-A-AH

## 2024-07-08 NOTE — Telephone Encounter (Signed)
 See message from 11/11.  Appointment made.  Nothing further needed.

## 2024-07-08 NOTE — Telephone Encounter (Signed)
 I called and spoke with patient, advised that we have not seen him in over a year and have attempted to reach him regarding his CT scan results and to schedule f/u without success.  I let him know that for any new problem that needs immediate attention' he will have to seek emergency care or see his PCP.  He ststws it can wait until he can see Dr. Kara.  I scheduled him to see Dr. Kara on 11/18 at 2:pm, advised to arrive by 1:45 pm for check in.  He verbalized understanding.  Nothing further needed.

## 2024-07-11 ENCOUNTER — Ambulatory Visit (INDEPENDENT_AMBULATORY_CARE_PROVIDER_SITE_OTHER)

## 2024-07-11 DIAGNOSIS — Z7901 Long term (current) use of anticoagulants: Secondary | ICD-10-CM

## 2024-07-11 LAB — POCT INR: INR: 3.1 — AB (ref 2.0–3.0)

## 2024-07-11 NOTE — Progress Notes (Signed)
 Discussed changing dose size to 2.5 mg to perform more precise dosing. Pt would like to wait at this moment. May change at next INR check.  Pt has cataract surgery in Nov and Dec. Warfarin does not need held.  Pt reports prednisone  was doubled in dose about 1 wk ago. Changes in prednisone  dosing normally increases pt's INR.  Pt already took warfarin today. Hold warfarin tomorrow and then change weekly dose to take 1/2 tablet daily. Recheck in 3 week.

## 2024-07-11 NOTE — Patient Instructions (Addendum)
 Pre visit review using our clinic review tool, if applicable. No additional management support is needed unless otherwise documented below in the visit note.  Hold warfarin tomorrow and then change weekly dose to take 1/2 tablet daily. Recheck in 3 week.

## 2024-07-15 ENCOUNTER — Other Ambulatory Visit: Payer: Self-pay | Admitting: Internal Medicine

## 2024-07-15 ENCOUNTER — Encounter: Payer: Self-pay | Admitting: Ophthalmology

## 2024-07-15 ENCOUNTER — Ambulatory Visit: Admitting: Pulmonary Disease

## 2024-07-15 ENCOUNTER — Other Ambulatory Visit: Payer: Self-pay | Admitting: Cardiology

## 2024-07-15 DIAGNOSIS — M150 Primary generalized (osteo)arthritis: Secondary | ICD-10-CM

## 2024-07-15 DIAGNOSIS — R609 Edema, unspecified: Secondary | ICD-10-CM

## 2024-07-15 NOTE — Discharge Instructions (Signed)

## 2024-07-16 ENCOUNTER — Encounter: Payer: Self-pay | Admitting: Ophthalmology

## 2024-07-16 NOTE — Anesthesia Preprocedure Evaluation (Addendum)
 Anesthesia Evaluation  Patient identified by MRN, date of birth, ID band Patient awake    Reviewed: Allergy & Precautions, H&P , NPO status , Patient's Chart, lab work & pertinent test results  Airway Mallampati: II  TM Distance: >3 FB Neck ROM: Full    Dental no notable dental hx. (+) Poor Dentition, Chipped   Pulmonary shortness of breath, COPD, former smoker 05-21-23 CT chest FINDINGS: Cardiovascular: Status post median sternotomy. Heart is enlarged. No pericardial effusion. There are calcifications along the mitral valve annulus. Coronary artery calcifications are seen. The thoracic aorta has diffuse calcifications. Prosthetic aortic valve.   Mediastinum/Nodes: Small thyroid  gland. Normal caliber thoracic esophagus. Small hiatal hernia. On this non IV contrast exam there is no specific abnormal lymph node enlargement identified in the axillary regions, hilum or mediastinum. There are a few small less than 1 cm in size in short axis in the mediastinum, nonpathologic by size criteria. These are unchanged from previous.   Lungs/Pleura: Small bilateral pleural effusions identified. Underlying centrilobular emphysematous changes are seen. Mild basilar atelectasis. Breathing motion identified throughout the examination. Apical pleural thickening. Bandlike ill-defined opacity at the right lung apex is stable going back to November 2023. Going back to older examinations this has been present since at least February 2023. This has actually decreased from that exam. Adjacent fiduciary marker. No new areas of consolidation.   Upper Abdomen: Adrenal glands are preserved in the upper abdomen.   Musculoskeletal: Advanced degenerative changes throughout the spine and shoulders. There are bridging syndesmophytes and osteophytes throughout the spine as well.   IMPRESSION: Stable small pleural effusions and right apical opacity.   No new  consolidation, dominant lung nodule or lymph node enlargement.   Postop chest with enlarged heart.   Aortic Atherosclerosis (ICD10-I70.0) and Emphysema (ICD10-J43.9).     Electronically Signed   By: Ranell Bring M.D.   On: 05/10/2023 13:46    Pulmonary exam normal breath sounds clear to auscultation       Cardiovascular hypertension, + Peripheral Vascular Disease and +CHF  Normal cardiovascular exam+ dysrhythmias  Rhythm:Regular Rate:Normal  04-20-24 echo  1. Left ventricular ejection fraction, by estimation, is 50 to 55%. The  left ventricle has low normal function. The left ventricle has no regional  wall motion abnormalities. There is moderate left ventricular hypertrophy.  Left ventricular diastolic  parameters are indeterminate.   2. Right ventricular systolic function is normal. The right ventricular  size is normal. There is moderately elevated pulmonary artery systolic  pressure.   3. Left atrial size was moderately dilated.   4. Right atrial size was moderately dilated.   5. The mitral valve is abnormal. Mild mitral valve regurgitation.  Moderate mitral annular calcification.   6. Tricuspid valve regurgitation is moderate.   7. Biprothetic AVR stable mean gradient mild AR no PVL no details in Epic  on size/type of valve. The aortic valve has been repaired/replaced. Aortic  valve regurgitation is mild. There is a bioprosthetic valve present in the  aortic position.   8. The inferior vena cava is dilated in size with >50% respiratory  variability, suggesting right atrial pressure of 8 mmHg.      Neuro/Psych   Anxiety     negative neurological ROS  negative psych ROS   GI/Hepatic negative GI ROS, Neg liver ROS, hiatal hernia,,,  Endo/Other  negative endocrine ROS    Renal/GU negative Renal ROS  negative genitourinary   Musculoskeletal negative musculoskeletal ROS (+) Arthritis ,  Abdominal   Peds negative pediatric ROS (+)   Hematology negative hematology ROS (+) Blood dyscrasia, anemia   Anesthesia Other Findings Medical History Note: patient has large black, assymetrical nevus on right side of face and another nevus on top of head, he has not had these checked by dermatologist, has not seen derm at all. Strongly urged him to have these checked by dermatologist and explained these may be cancer and may need removed, but also that I'm not a dermatologist and cannot diagnose them. Explained risk of untreated skin cancers, if these should prove to be any type of skin cancer.  HTN (hypertension)  Atrial fibrillation (HCC) Aortic stenosis  But has bioprosthetic aortic valve replacement Erectile dysfunction Gout  MVP (mitral valve prolapse) Baker cyst  Eczema DJD (degenerative joint disease) Shortness of breath dyspnea Dysrhythmia  History of hiatal hernia History of radiation therapy  Mild mitral regurgitation by prior echocardiogram Moderate tricuspid regurgitation by prior echocardiogram  Hx repair thoracic aortic aneurysm 2006    Reproductive/Obstetrics negative OB ROS                              Anesthesia Physical Anesthesia Plan  ASA: 3  Anesthesia Plan: MAC   Post-op Pain Management:    Induction: Intravenous  PONV Risk Score and Plan:   Airway Management Planned: Natural Airway and Nasal Cannula  Additional Equipment:   Intra-op Plan:   Post-operative Plan:   Informed Consent: I have reviewed the patients History and Physical, chart, labs and discussed the procedure including the risks, benefits and alternatives for the proposed anesthesia with the patient or authorized representative who has indicated his/her understanding and acceptance.     Dental Advisory Given  Plan Discussed with: Anesthesiologist, CRNA and Surgeon  Anesthesia Plan Comments: (Patient consented for risks of anesthesia including but not limited to:  - adverse reactions to  medications - damage to eyes, teeth, lips or other oral mucosa - nerve damage due to positioning  - sore throat or hoarseness - Damage to heart, brain, nerves, lungs, other parts of body or loss of life  Patient voiced understanding and assent.)         Anesthesia Quick Evaluation

## 2024-07-17 ENCOUNTER — Encounter: Admitting: Anesthesiology

## 2024-07-17 ENCOUNTER — Ambulatory Visit: Admitting: Anesthesiology

## 2024-07-17 ENCOUNTER — Other Ambulatory Visit: Payer: Self-pay

## 2024-07-17 ENCOUNTER — Encounter
Admission: RE | Disposition: A | Discharge status: Home / Self Care | Payer: Self-pay | Source: Home / Self Care | Attending: Ophthalmology

## 2024-07-17 ENCOUNTER — Ambulatory Visit
Admission: RE | Admit: 2024-07-17 | Discharge: 2024-07-17 | Disposition: A | Discharge status: Home / Self Care | Attending: Ophthalmology | Admitting: Ophthalmology

## 2024-07-17 ENCOUNTER — Encounter: Payer: Self-pay | Admitting: Ophthalmology

## 2024-07-17 DIAGNOSIS — I509 Heart failure, unspecified: Secondary | ICD-10-CM | POA: Insufficient documentation

## 2024-07-17 DIAGNOSIS — I739 Peripheral vascular disease, unspecified: Secondary | ICD-10-CM | POA: Insufficient documentation

## 2024-07-17 DIAGNOSIS — I10 Essential (primary) hypertension: Secondary | ICD-10-CM | POA: Diagnosis not present

## 2024-07-17 DIAGNOSIS — I110 Hypertensive heart disease with heart failure: Secondary | ICD-10-CM | POA: Insufficient documentation

## 2024-07-17 DIAGNOSIS — H2512 Age-related nuclear cataract, left eye: Secondary | ICD-10-CM | POA: Diagnosis present

## 2024-07-17 DIAGNOSIS — H25042 Posterior subcapsular polar age-related cataract, left eye: Secondary | ICD-10-CM | POA: Diagnosis present

## 2024-07-17 DIAGNOSIS — Z87891 Personal history of nicotine dependence: Secondary | ICD-10-CM | POA: Insufficient documentation

## 2024-07-17 DIAGNOSIS — J449 Chronic obstructive pulmonary disease, unspecified: Secondary | ICD-10-CM | POA: Diagnosis not present

## 2024-07-17 HISTORY — DX: Rheumatic tricuspid insufficiency: I07.1

## 2024-07-17 HISTORY — DX: Nonrheumatic mitral (valve) insufficiency: I34.0

## 2024-07-17 HISTORY — PX: CATARACT EXTRACTION W/PHACO: SHX586

## 2024-07-17 SURGERY — PHACOEMULSIFICATION, CATARACT, WITH IOL INSERTION
Anesthesia: Monitor Anesthesia Care | Site: Eye | Laterality: Left

## 2024-07-17 MED ORDER — TETRACAINE HCL 0.5 % OP SOLN
OPHTHALMIC | Status: AC
  Filled 2024-07-17: qty 4

## 2024-07-17 MED ORDER — FENTANYL CITRATE (PF) 100 MCG/2ML IJ SOLN
INTRAMUSCULAR | Status: DC | PRN
  Administered 2024-07-17 (×2): 50 ug via INTRAVENOUS

## 2024-07-17 MED ORDER — PHENYLEPHRINE HCL 10 % OP SOLN
1.0000 [drp] | OPHTHALMIC | Status: AC
  Administered 2024-07-17 (×3): 1 [drp] via OPHTHALMIC

## 2024-07-17 MED ORDER — PHENYLEPHRINE-KETOROLAC 1-0.3 % IO SOLN
INTRAOCULAR | Status: DC | PRN
  Administered 2024-07-17: 111 mL via OPHTHALMIC

## 2024-07-17 MED ORDER — MOXIFLOXACIN HCL 0.5 % OP SOLN
OPHTHALMIC | Status: DC | PRN
  Administered 2024-07-17: .2 mL via OPHTHALMIC

## 2024-07-17 MED ORDER — LACTATED RINGERS IV SOLN: INTRAVENOUS | Status: DC

## 2024-07-17 MED ORDER — LIDOCAINE HCL (PF) 2 % IJ SOLN
INTRAOCULAR | Status: DC | PRN
  Administered 2024-07-17: 4 mL via INTRAOCULAR

## 2024-07-17 MED ORDER — SIGHTPATH DOSE#1 NA HYALUR & NA CHOND-NA HYALUR IO KIT
PACK | INTRAOCULAR | Status: DC | PRN
  Administered 2024-07-17: 1 via OPHTHALMIC

## 2024-07-17 MED ORDER — FENTANYL CITRATE (PF) 100 MCG/2ML IJ SOLN
INTRAMUSCULAR | Status: AC
  Filled 2024-07-17: qty 2

## 2024-07-17 MED ORDER — SIGHTPATH DOSE#1 BSS IO SOLN
INTRAOCULAR | Status: DC | PRN
  Administered 2024-07-17: 15 mL via INTRAOCULAR

## 2024-07-17 MED ORDER — TETRACAINE HCL 0.5 % OP SOLN
1.0000 [drp] | OPHTHALMIC | Status: DC | PRN
  Administered 2024-07-17 (×3): 1 [drp] via OPHTHALMIC

## 2024-07-17 MED ORDER — BRIMONIDINE TARTRATE-TIMOLOL 0.2-0.5 % OP SOLN
OPHTHALMIC | Status: DC | PRN
  Administered 2024-07-17: 1 [drp] via OPHTHALMIC

## 2024-07-17 MED ORDER — CYCLOPENTOLATE HCL 2 % OP SOLN
OPHTHALMIC | Status: AC
  Filled 2024-07-17: qty 2

## 2024-07-17 MED ORDER — PHENYLEPHRINE HCL 10 % OP SOLN
OPHTHALMIC | Status: AC
Start: 2024-07-17 — End: 2024-07-17
  Filled 2024-07-17: qty 5

## 2024-07-17 MED ORDER — CYCLOPENTOLATE HCL 2 % OP SOLN
1.0000 [drp] | OPHTHALMIC | Status: AC
  Administered 2024-07-17 (×3): 1 [drp] via OPHTHALMIC

## 2024-07-17 SURGICAL SUPPLY — 10 items
DISSECTOR HYDRO NUCLEUS 50X22 (MISCELLANEOUS) ×1 IMPLANT
DRSG TEGADERM 2-3/8X2-3/4 SM (GAUZE/BANDAGES/DRESSINGS) ×1 IMPLANT
FEE CATARACT SUITE SIGHTPATH (MISCELLANEOUS) ×1 IMPLANT
GLOVE BIOGEL PI IND STRL 8 (GLOVE) ×1 IMPLANT
GLOVE SURG LX STRL 7.5 STRW (GLOVE) ×1 IMPLANT
GLOVE SURG SYN 6.5 PF PI BL (GLOVE) ×1 IMPLANT
LENS IOL TECNIS MONO 20.5 (Intraocular Lens) IMPLANT
NDL FILTER BLUNT 18X1 1/2 (NEEDLE) ×1 IMPLANT
NEEDLE FILTER BLUNT 18X1 1/2 (NEEDLE) ×1 IMPLANT
SYR 3ML LL SCALE MARK (SYRINGE) ×1 IMPLANT

## 2024-07-17 NOTE — Op Note (Signed)
 OPERATIVE NOTE  [name] [id number] 07/17/2024   PREOPERATIVE DIAGNOSIS: Nuclear sclerotic cataract left eye. H25.12   POSTOPERATIVE DIAGNOSIS: Nuclear sclerotic cataract left eye. H25.12   PROCEDURE:  Phacoemulsification with posterior chamber intraocular lens placement of the left eye  Ultrasound time: Procedure(s): PHACOEMULSIFICATION, CATARACT, WITH IOL INSERTION 7.36 01:03.9 (Left)  LENS:   Implant Name Type Inv. Item Serial No. Manufacturer Lot No. LRB No. Used Action  LENS IOL TECNIS MONO 20.5 - [id number] Intraocular Lens LENS IOL TECNIS MONO 20.5 [id number] [employer]  Left 1 Implanted      SURGEON:  Feliciano HERO. Enola, MD   ANESTHESIA:  Topical with tetracaine drops, augmented with 1% preservative-free intracameral lidocaine .   COMPLICATIONS:  None.   DESCRIPTION OF PROCEDURE:  The patient was identified in the holding room and transported to the operating room and placed in the supine position under the operating microscope.  The left eye was identified as the operative eye, which was prepped and draped in the usual sterile ophthalmic fashion.   A 1 millimeter clear-corneal paracentesis was made inferotemporally. Preservative-free 1% lidocaine  mixed with 1:1,000 bisulfite-free aqueous solution of epinephrine  was injected into the anterior chamber. The anterior chamber was then filled with Viscoat viscoelastic. A 2.4 millimeter keratome was used to make a clear-corneal incision superotemporally. A curvilinear capsulorrhexis was made with a cystotome and capsulorrhexis forceps. Balanced salt solution was used to hydrodissect and hydrodelineate the nucleus. Phacoemulsification was then used to remove the lens nucleus and epinucleus. The remaining cortex was then removed using the irrigation and aspiration handpiece. Provisc was then placed into the capsular bag to distend it for lens placement. A +20.50 D [id number] intraocular lens was then injected into the capsular bag. The  remaining viscoelastic was aspirated.   Wounds were hydrated with balanced salt solution.  The anterior chamber was inflated to a physiologic pressure with balanced salt solution.  No wound leaks were noted. Moxifloxacin was injected intracamerally.  Timolol and Brimonidine drops were applied to the eye.  The patient was taken to the recovery room in stable condition without complications of anesthesia or surgery.  [employer] 07/17/2024, 1:31 PM

## 2024-07-17 NOTE — H&P (Signed)
 [facility]   Primary Care Physician:  Theophilus Andrews, Tully GRADE, MD Ophthalmologist: Dr. Feliciano Ober  Pre-Procedure History & Physical: HPI:  [name] is a 88 y.o. male here for cataract surgery.   Past Medical History:  Diagnosis Date   Aortic stenosis    Atrial fibrillation (HCC)    Baker cyst    Left knee   DJD (degenerative joint disease)    knees   Dysrhythmia    Eczema    Erectile dysfunction    Gout    History of hiatal hernia    History of radiation therapy    Right lung- 04/21/21-05/03/21 Dr. Lynwood Nasuti   History of repair of thoracic aortic aneurysm 2006   HTN (hypertension)    Mild mitral regurgitation by prior echocardiogram    Moderate tricuspid regurgitation by prior echocardiogram    MVP (mitral valve prolapse)    Shortness of breath dyspnea    with activity    Past Surgical History:  Procedure Laterality Date    polyps vocal cord  1994   AORTIC VALVE REPLACEMENT  ~2004   BRONCHIAL BIOPSY  03/07/2021   Procedure: BRONCHIAL BIOPSIES;  Surgeon: Shelah Lamar RAMAN, MD;  Location: [facility];  Service: Pulmonary;;   BRONCHIAL BIOPSY  11/14/2021   Procedure: BRONCHIAL BIOPSIES;  Surgeon: Shelah Lamar RAMAN, MD;  Location: [facility];  Service: Pulmonary;;   BRONCHIAL BRUSHINGS  03/07/2021   Procedure: BRONCHIAL BRUSHINGS;  Surgeon: Shelah Lamar RAMAN, MD;  Location: [facility];  Service: Pulmonary;;   BRONCHIAL BRUSHINGS  11/14/2021   Procedure: BRONCHIAL BRUSHINGS;  Surgeon: Shelah Lamar RAMAN, MD;  Location: [facility];  Service: Pulmonary;;   BRONCHIAL NEEDLE ASPIRATION BIOPSY  03/07/2021   Procedure: BRONCHIAL NEEDLE ASPIRATION BIOPSIES;  Surgeon: Shelah Lamar RAMAN, MD;  Location: [facility];  Service: Pulmonary;;   BRONCHIAL NEEDLE ASPIRATION BIOPSY  11/14/2021   Procedure: BRONCHIAL NEEDLE ASPIRATION BIOPSIES;  Surgeon: Shelah Lamar RAMAN, MD;  Location: [facility];  Service: Pulmonary;;   BRONCHIAL WASHINGS  09/21/2020   Procedure: BRONCHIAL  WASHINGS;  Surgeon: Kara Dorn NOVAK, MD;  Location: [facility];  Service: Pulmonary;;  BAL    BRONCHIAL WASHINGS  03/07/2021   Procedure: BRONCHIAL WASHINGS;  Surgeon: Shelah Lamar RAMAN, MD;  Location: [facility];  Service: Pulmonary;;   BRONCHIAL WASHINGS  11/14/2021   Procedure: BRONCHIAL WASHINGS;  Surgeon: Shelah Lamar RAMAN, MD;  Location: [facility];  Service: Pulmonary;;   FIDUCIAL MARKER PLACEMENT  03/07/2021   Procedure: FIDUCIAL MARKER PLACEMENT;  Surgeon: Shelah Lamar RAMAN, MD;  Location: [facility];  Service: Pulmonary;;   HERNIA REPAIR  [phone]   HERNIA REPAIR  ~1983   IR ANGIOGRAM PELVIS SELECTIVE OR SUPRASELECTIVE  12/01/2022   IR ANGIOGRAM PELVIS SELECTIVE OR SUPRASELECTIVE  12/01/2022   IR ANGIOGRAM SELECTIVE EACH ADDITIONAL VESSEL  12/01/2022   IR ANGIOGRAM SELECTIVE EACH ADDITIONAL VESSEL  12/01/2022   IR CT SPINE LTD  12/01/2022   IR CT SPINE LTD  12/01/2022   IR CT SPINE LTD  12/01/2022   IR EMBO ARTERIAL NOT HEMORR HEMANG INC GUIDE ROADMAPPING  12/01/2022   IR EMBO TUMOR ORGAN ISCHEMIA INFARCT INC GUIDE ROADMAPPING  12/01/2022   IR RADIOLOGIST EVAL & MGMT  11/17/2022   IR US  GUIDE VASC ACCESS LEFT  12/01/2022   IR US  GUIDE VASC ACCESS RIGHT  12/01/2022   LUMBAR LAMINECTOMY/DECOMPRESSION MICRODISCECTOMY N/A 01/15/2015   Procedure: 2 LEVEL DECOMPRESSIVE LUMBAR LAMINECTOMY L3-L4,L4-L5;  Surgeon: Tanda Heading, MD;  Location: [facility];  Service: Orthopedics;  Laterality: N/A;   LUMBAR LAMINECTOMY/DECOMPRESSION MICRODISCECTOMY N/A 05/01/2017   Procedure: Lumbar one-Lumbar two, Lumbar two-Lumbar three, Lumbar three-Lumbar four Laminectomy; Evacuation of hematoma;  Surgeon: Lanis Pupa, MD;  [facility];  Service: Neurosurgery;  Laterality: N/A;   right total hip  ~4 years ago   ROTATOR CUFF REPAIR Left ~2009   TOTAL KNEE ARTHROPLASTY Left 04/14/2015   Procedure: LEFT TOTAL KNEE ARTHROPLASTY;  Surgeon: Tanda Heading, MD;  Location: [facility];  Service: Orthopedics;  Laterality: Left;   VIDEO  BRONCHOSCOPY N/A 09/21/2020   Procedure: VIDEO BRONCHOSCOPY WITHOUT FLUORO;  Surgeon: Kara Dorn NOVAK, MD;  Location: [facility];  Service: Pulmonary;  Laterality: N/A;   VIDEO BRONCHOSCOPY WITH ENDOBRONCHIAL NAVIGATION Right 03/07/2021   Procedure: VIDEO BRONCHOSCOPY WITH ENDOBRONCHIAL NAVIGATION;  Surgeon: Shelah Lamar RAMAN, MD;  Location[facility];  Service: Pulmonary;  Laterality: Right;   VIDEO BRONCHOSCOPY WITH RADIAL ENDOBRONCHIAL ULTRASOUND  11/14/2021   Procedure: VIDEO BRONCHOSCOPY WITH RADIAL ENDOBRONCHIAL ULTRASOUND;  Surgeon: Shelah Lamar RAMAN, MD;  Location: [facility];  Service: Pulmonary;;    Prior to Admission medications   Medication Sig Start Date End Date Taking? Authorizing Provider  allopurinol  (ZYLOPRIM ) 300 MG tablet Take 1 tablet by mouth once daily 06/10/24   Theophilus Andrews, Tully GRADE, MD  amLODipine  (NORVASC ) 5 MG tablet Take 1 tablet by mouth once daily 06/10/24   Theophilus Andrews, Tully GRADE, MD  cyanocobalamin  (VITAMIN B12) 500 MCG tablet Take 1 tablet (500 mcg total) by mouth daily. Patient taking differently: Take 500 mcg by mouth daily. BID 09/22/23   Regalado, Belkys A, MD  feeding supplement (ENSURE PLUS HIGH PROTEIN) LIQD Take 237 mLs by mouth 2 (two) times daily between meals. 04/23/24   Pokhrel, Laxman, MD  furosemide  (LASIX ) 20 MG tablet Take 1 tablet (20 mg total) by mouth daily after breakfast. 04/23/24   Pokhrel, Vernal, MD  potassium chloride  SA (KLOR-CON  M) 20 MEQ tablet Take 1 tablet (20 mEq total) by mouth daily. Patient taking differently: Take 20 mEq by mouth in the morning. 07/11/23   Pietro Redell RAMAN, MD  predniSONE  (DELTASONE ) 20 MG tablet Take 1 tablet by mouth once daily with breakfast 06/10/24   Theophilus Andrews, Tully GRADE, MD  tamsulosin  (FLOMAX ) 0.4 MG CAPS capsule Take 1 capsule (0.4 mg total) by mouth daily after supper. 04/23/24   Pokhrel, Laxman, MD  traMADol  (ULTRAM ) 50 MG tablet TAKE 1 TABLET BY MOUTH EVERY 12 HOURS AS NEEDED 07/15/24    Theophilus Andrews, Tully GRADE, MD  traZODone  (DESYREL ) 50 MG tablet TAKE 1 TABLET BY MOUTH AT BEDTIME AS NEEDED Patient taking differently: Take 50 mg by mouth at bedtime as needed for sleep. 03/27/24   Theophilus Andrews, Tully GRADE, MD  warfarin (COUMADIN ) 5 MG tablet TAKE 1/2 (ONE-HALF) A TABLET BY MOUTH ONCE DAILY EXCEPT  TAKE 1 TABLET ON WEDNESDAY AS DIRECTED  BY  ANTICOAGULATION  CLINIC Patient taking differently: Take 2.5 mg by mouth daily. TAKE 1/2 (ONE-HALF) A TABLET BY MOUTH ONCE DAILY EXCEPT  TAKE 1 TABLET ON WEDNESDAY AS DIRECTED  BY  ANTICOAGULATION  CLINIC 03/27/24   Theophilus Andrews, Tully GRADE, MD    Allergies as of 06/06/2024   (No Known Allergies)    Family History  Problem Relation Age of Onset   Diabetes Other    Stroke Other     Social History   Socioeconomic History   Marital status: Divorced    Spouse name: Not on file   Number of children:  Not on file   Years of education: Not on file   Highest education level: Not on file  Occupational History   Not on file  Tobacco Use   Smoking status: Former    Current packs/day: 0.00    Types: Cigarettes    Start date: 08/28/1972    Quit date: 08/28/2002    Years since quitting: 21.9   Smokeless tobacco: Never  Vaping Use   Vaping status: Never Used  Substance and Sexual Activity   Alcohol use: Yes    Alcohol/week: 3.0 standard drinks of alcohol    Types: 3 Shots of liquor per week    Comment: social on Fri/Sat   Drug use: No   Sexual activity: Not on file  Other Topics Concern   Not on file  Social History Narrative   Not on file   Social Drivers of Health   Financial Resource Strain: Low Risk  (10/30/2023)   Overall Financial Resource Strain (CARDIA)    Difficulty of Paying Living Expenses: Not hard at all  Food Insecurity: No Food Insecurity (04/19/2024)   Hunger Vital Sign    Worried About Running Out of Food in the Last Year: Never true    Ran Out of Food in the Last Year: Never true  Transportation Needs: No  Transportation Needs (04/19/2024)   PRAPARE - Administrator, Civil Service (Medical): No    Lack of Transportation (Non-Medical): No  Physical Activity: Insufficiently Active (10/30/2023)   Exercise Vital Sign    Days of Exercise per Week: 3 days    Minutes of Exercise per Session: 30 min  Stress: No Stress Concern Present (10/30/2023)   [employer] of Occupational Health - Occupational Stress Questionnaire    Feeling of Stress : Not at all  Social Connections: Moderately Isolated (04/19/2024)   Social Connection and Isolation Panel    Frequency of Communication with Friends and Family: More than three times a week    Frequency of Social Gatherings with Friends and Family: Once a week    Attends Religious Services: More than 4 times per year    Active Member of [employer] or Organizations: No    Attends Banker Meetings: Never    Marital Status: Widowed  Intimate Partner Violence: Not At Risk (04/19/2024)   Humiliation, Afraid, Rape, and Kick questionnaire    Fear of Current or Ex-Partner: No    Emotionally Abused: No    Physically Abused: No    Sexually Abused: No    Review of Systems: See HPI, otherwise negative ROS  Physical Exam: Ht 5' 6 (1.676 m)   Wt 74.4 kg   BMI 26.47 kg/m  General:   Alert, cooperative in NAD Head:  Normocephalic and atraumatic. Respiratory:  Normal work of breathing. Cardiovascular:  RRR  Impression/Plan: [name] is here for cataract surgery.  Risks, benefits, limitations, and alternatives regarding cataract surgery have been reviewed with the patient.  Questions have been answered.  All parties agreeable.   Feliciano Bryan Ober, MD  07/17/2024, 7:25 AM

## 2024-07-17 NOTE — Transfer of Care (Signed)
 Immediate Anesthesia Transfer of Care Note  Patient: [name]  Procedure(s) Performed: PHACOEMULSIFICATION, CATARACT, WITH IOL INSERTION 7.36 01:03.9 (Left: Eye)  Patient Location: PACU  Anesthesia Type: MAC  Level of Consciousness: awake, alert  and patient cooperative  Airway and Oxygen Therapy: Patient Spontanous Breathing   Post-op Assessment: Post-op Vital signs reviewed, Patient's Cardiovascular Status Stable, Respiratory Function Stable, Patent Airway and No signs of Nausea or vomiting  Post-op Vital Signs: Reviewed and stable  Complications: No notable events documented.

## 2024-07-17 NOTE — Anesthesia Postprocedure Evaluation (Signed)
 Anesthesia Post Note  Patient: [name]  Procedure(s) Performed: PHACOEMULSIFICATION, CATARACT, WITH IOL INSERTION 7.36 01:03.9 (Left: Eye)  Patient location during evaluation: PACU Anesthesia Type: MAC Level of consciousness: awake and alert Pain management: pain level controlled Vital Signs Assessment: post-procedure vital signs reviewed and stable Respiratory status: spontaneous breathing, nonlabored ventilation, respiratory function stable and patient connected to nasal cannula oxygen Cardiovascular status: stable and blood pressure returned to baseline Postop Assessment: no apparent nausea or vomiting Anesthetic complications: no   No notable events documented.   Last Vitals:  Vitals:   07/17/24 1141 07/17/24 1332  BP: (!) 149/61 (!) 125/58  Pulse: 89 84  Resp: 11 18  Temp: 36.8 C (!) 36.2 C  SpO2: 100% 99%    Last Pain:  Vitals:   07/17/24 1338  TempSrc:   PainSc: 0-No pain                 Monti Jilek C Chloe Miyoshi

## 2024-07-28 NOTE — Discharge Instructions (Signed)

## 2024-07-28 NOTE — Anesthesia Preprocedure Evaluation (Signed)
 Anesthesia Evaluation  Patient identified by MRN, date of birth, ID band Patient awake    Reviewed: Allergy & Precautions, H&P , NPO status , Patient's Chart, lab work & pertinent test results  Airway Mallampati: II  TM Distance: >3 FB Neck ROM: Full    Dental no notable dental hx. (+) Chipped, Poor Dentition Poor Dentition, Chipped   :   Pulmonary neg pulmonary ROS, shortness of breath, COPD, former smoker 05-21-23 CT chest FINDINGS: Cardiovascular: Status post median sternotomy. Heart is enlarged. No pericardial effusion. There are calcifications along the mitral valve annulus. Coronary artery calcifications are seen. The thoracic aorta has diffuse calcifications. Prosthetic aortic valve.   Mediastinum/Nodes: Small thyroid  gland. Normal caliber thoracic esophagus. Small hiatal hernia. On this non IV contrast exam there is no specific abnormal lymph node enlargement identified in the axillary regions, hilum or mediastinum. There are a few small less than 1 cm in size in short axis in the mediastinum, nonpathologic by size criteria. These are unchanged from previous.   Lungs/Pleura: Small bilateral pleural effusions identified. Underlying centrilobular emphysematous changes are seen. Mild basilar atelectasis. Breathing motion identified throughout the examination. Apical pleural thickening. Bandlike ill-defined opacity at the right lung apex is stable going back to November 2023. Going back to older examinations this has been present since at least February 2023. This has actually decreased from that exam. Adjacent fiduciary marker. No new areas of consolidation.   Upper Abdomen: Adrenal glands are preserved in the upper abdomen.   Musculoskeletal: Advanced degenerative changes throughout the spine and shoulders. There are bridging syndesmophytes and osteophytes throughout the spine as well.   IMPRESSION: Stable small pleural  effusions and right apical opacity.   No new consolidation, dominant lung nodule or lymph node enlargement.   Postop chest with enlarged heart.   Aortic Atherosclerosis (ICD10-I70.0) and Emphysema (ICD10-J43.9).     Electronically Signed   By: Ranell Bring M.D.   On: 05/10/2023 13:46        Pulmonary exam normal breath sounds clear to auscultation       Cardiovascular hypertension, + Peripheral Vascular Disease and +CHF  negative cardio ROS Normal cardiovascular exam+ dysrhythmias  Rhythm:Regular Rate:Normal   04-20-24 echo  1. Left ventricular ejection fraction, by estimation, is 50 to 55%. The  left ventricle has low normal function. The left ventricle has no regional  wall motion abnormalities. There is moderate left ventricular hypertrophy.  Left ventricular diastolic  parameters are indeterminate.   2. Right ventricular systolic function is normal. The right ventricular  size is normal. There is moderately elevated pulmonary artery systolic  pressure.   3. Left atrial size was moderately dilated.   4. Right atrial size was moderately dilated.   5. The mitral valve is abnormal. Mild mitral valve regurgitation.  Moderate mitral annular calcification.   6. Tricuspid valve regurgitation is moderate.   7. Biprothetic AVR stable mean gradient mild AR no PVL no details in Epic  on size/type of valve. The aortic valve has been repaired/replaced. Aortic  valve regurgitation is mild. There is a bioprosthetic valve present in the  aortic position.   8. The inferior vena cava is dilated in size with >50% respiratory  variability, suggesting right atrial pressure of 8 mmHg.           Neuro/Psych   Anxiety     negative neurological ROS  negative psych ROS   GI/Hepatic negative GI ROS, Neg liver ROS, hiatal hernia,,,  Endo/Other  negative endocrine ROS  Renal/GU negative Renal ROS  negative genitourinary   Musculoskeletal negative musculoskeletal ROS (+)  Arthritis ,    Abdominal   Peds negative pediatric ROS (+)  Hematology negative hematology ROS (+) Blood dyscrasia, anemia   Anesthesia Other Findings Previous cataract surgery 07-17-24 Dr. Ola  Previously noted  what appeared to be a nevus, patient says is a scab.  HTN (hypertension)             Atrial fibrillation (HCC) Aortic stenosis But has bioprosthetic aortic valve replacement Erectile dysfunction Gout             MVP (mitral valve prolapse) Baker cyst      Eczema DJD (degenerative joint disease)Shortness of breath dyspnea Dysrhythmia             History of hiatal hernia History of radiation therapy     Mild mitral regurgitation by prior echocardiogram Moderate tricuspid regurgitation by prior echocardiogram         Hx repair thoracic aortic aneurysm 2006   Reproductive/Obstetrics negative OB ROS                              Anesthesia Physical Anesthesia Plan  ASA: 3  Anesthesia Plan: MAC   Post-op Pain Management:    Induction: Intravenous  PONV Risk Score and Plan:   Airway Management Planned: Natural Airway and Nasal Cannula  Additional Equipment:   Intra-op Plan:   Post-operative Plan:   Informed Consent: I have reviewed the patients History and Physical, chart, labs and discussed the procedure including the risks, benefits and alternatives for the proposed anesthesia with the patient or authorized representative who has indicated his/her understanding and acceptance.     Dental Advisory Given  Plan Discussed with: Anesthesiologist, CRNA and Surgeon  Anesthesia Plan Comments: (Patient consented for risks of anesthesia including but not limited to:  - adverse reactions to medications - damage to eyes, teeth, lips or other oral mucosa - nerve damage due to positioning  - sore throat or hoarseness - Damage to heart, brain, nerves, lungs, other parts of body or loss of life  Patient voiced understanding and  assent.)         Anesthesia Quick Evaluation

## 2024-07-29 ENCOUNTER — Ambulatory Visit

## 2024-07-29 DIAGNOSIS — Z7901 Long term (current) use of anticoagulants: Secondary | ICD-10-CM

## 2024-07-29 LAB — POCT INR: INR: 1.7 — AB (ref 2.0–3.0)

## 2024-07-29 MED ORDER — WARFARIN SODIUM 2.5 MG PO TABS: ORAL_TABLET | ORAL | 1 refills | Status: AC

## 2024-07-29 NOTE — Patient Instructions (Addendum)
 Pre visit review using our clinic review tool, if applicable. No additional management support is needed unless otherwise documented below in the visit note.  Pick up new prescription and start using it today. Increase dose today to take 1 1/2 tablets and then change weekly take 1 tablet daily  except take 1 1/2 tablet on Friday.  Recheck in 2 week.

## 2024-07-29 NOTE — Progress Notes (Signed)
 Indication: Afib Discussed changing dose size to 2.5 mg to perform more precise dosing at last INR check. Pt has agreed to the change in dose. A new script for 2.5 mg tablets has been sent to pharmacy of choice and 5 mg warfarin has been d/c. Pt had cataract surgery in 11/20 and will have the other eye surgery on 12/4. Warfarin does not need held.  Pick up new prescription and start using it today. Increase dose today to take 1 1/2 tablets and then change weekly take 1 tablet daily  except take 1 1/2 tablet on Friday.  Recheck in 2 week.

## 2024-07-31 ENCOUNTER — Encounter
Admission: RE | Disposition: A | Discharge status: Home / Self Care | Payer: Self-pay | Source: Home / Self Care | Attending: Ophthalmology

## 2024-07-31 ENCOUNTER — Ambulatory Visit: Admitting: Anesthesiology

## 2024-07-31 ENCOUNTER — Encounter: Payer: Self-pay | Admitting: Ophthalmology

## 2024-07-31 ENCOUNTER — Ambulatory Visit
Admission: RE | Admit: 2024-07-31 | Discharge: 2024-07-31 | Disposition: A | Attending: Ophthalmology | Admitting: Ophthalmology

## 2024-07-31 ENCOUNTER — Other Ambulatory Visit: Payer: Self-pay

## 2024-07-31 HISTORY — PX: CATARACT EXTRACTION W/PHACO: SHX586

## 2024-07-31 SURGERY — PHACOEMULSIFICATION, CATARACT, WITH IOL INSERTION
Anesthesia: Topical | Site: Eye | Laterality: Right

## 2024-07-31 MED ORDER — TETRACAINE HCL 0.5 % OP SOLN
OPHTHALMIC | Status: AC
  Filled 2024-07-31: qty 4

## 2024-07-31 MED ORDER — CYCLOPENTOLATE HCL 2 % OP SOLN
1.0000 [drp] | OPHTHALMIC | Status: DC | PRN
  Administered 2024-07-31 (×3): 1 [drp] via OPHTHALMIC

## 2024-07-31 MED ORDER — PHENYLEPHRINE HCL 10 % OP SOLN
OPHTHALMIC | Status: AC
  Filled 2024-07-31: qty 5

## 2024-07-31 MED ORDER — PHENYLEPHRINE-KETOROLAC 1-0.3 % IO SOLN
INTRAOCULAR | Status: DC | PRN
  Administered 2024-07-31: 97 mL via OPHTHALMIC

## 2024-07-31 MED ORDER — TETRACAINE HCL 0.5 % OP SOLN
1.0000 [drp] | OPHTHALMIC | Status: DC | PRN
  Administered 2024-07-31 (×3): 1 [drp] via OPHTHALMIC

## 2024-07-31 MED ORDER — MOXIFLOXACIN HCL 0.5 % OP SOLN
OPHTHALMIC | Status: DC | PRN
  Administered 2024-07-31: .2 mL via OPHTHALMIC

## 2024-07-31 MED ORDER — FENTANYL CITRATE (PF) 100 MCG/2ML IJ SOLN
INTRAMUSCULAR | Status: DC | PRN
  Administered 2024-07-31 (×2): 50 ug via INTRAVENOUS

## 2024-07-31 MED ORDER — BRIMONIDINE TARTRATE-TIMOLOL 0.2-0.5 % OP SOLN
OPHTHALMIC | Status: DC | PRN
  Administered 2024-07-31: 1 [drp] via OPHTHALMIC

## 2024-07-31 MED ORDER — MIDAZOLAM HCL 2 MG/2ML IJ SOLN
INTRAMUSCULAR | Status: AC
  Filled 2024-07-31: qty 2

## 2024-07-31 MED ORDER — FENTANYL CITRATE (PF) 100 MCG/2ML IJ SOLN
INTRAMUSCULAR | Status: AC
  Filled 2024-07-31: qty 2

## 2024-07-31 MED ORDER — LACTATED RINGERS IV SOLN: INTRAVENOUS | Status: DC

## 2024-07-31 MED ORDER — SIGHTPATH DOSE#1 NA HYALUR & NA CHOND-NA HYALUR IO KIT
PACK | INTRAOCULAR | Status: DC | PRN
  Administered 2024-07-31: 1 via OPHTHALMIC

## 2024-07-31 MED ORDER — LIDOCAINE HCL (PF) 2 % IJ SOLN
INTRAOCULAR | Status: DC | PRN
  Administered 2024-07-31: 4 mL via INTRAOCULAR

## 2024-07-31 MED ORDER — SIGHTPATH DOSE#1 BSS IO SOLN
INTRAOCULAR | Status: DC | PRN
  Administered 2024-07-31: 15 mL via INTRAOCULAR

## 2024-07-31 MED ORDER — PHENYLEPHRINE HCL 10 % OP SOLN
1.0000 [drp] | OPHTHALMIC | Status: DC | PRN
  Administered 2024-07-31 (×3): 1 [drp] via OPHTHALMIC

## 2024-07-31 MED ORDER — CYCLOPENTOLATE HCL 2 % OP SOLN
OPHTHALMIC | Status: AC
  Filled 2024-07-31: qty 2

## 2024-07-31 SURGICAL SUPPLY — 9 items
DISSECTOR HYDRO NUCLEUS 50X22 (MISCELLANEOUS) ×1 IMPLANT
DRSG TEGADERM 2-3/8X2-3/4 SM (GAUZE/BANDAGES/DRESSINGS) ×1 IMPLANT
FEE CATARACT SUITE SIGHTPATH (MISCELLANEOUS) ×1 IMPLANT
GLOVE BIOGEL PI IND STRL 8 (GLOVE) ×1 IMPLANT
GLOVE SURG LX STRL 7.5 STRW (GLOVE) ×1 IMPLANT
GLOVE SURG SYN 6.5 PF PI BL (GLOVE) ×1 IMPLANT
LENS IOL TECNIS MONO 20.5 (Intraocular Lens) IMPLANT
NDL FILTER BLUNT 18X1 1/2 (NEEDLE) ×1 IMPLANT
SYR 3ML LL SCALE MARK (SYRINGE) ×1 IMPLANT

## 2024-07-31 NOTE — Anesthesia Postprocedure Evaluation (Signed)
 Anesthesia Post Note  Patient: [name]  Procedure(s) Performed: PHACOEMULSIFICATION, CATARACT, WITH IOL INSERTION 7.44 01:03.4 (Right: Eye)  Patient location during evaluation: PACU Anesthesia Type: MAC Level of consciousness: awake and alert Pain management: pain level controlled Vital Signs Assessment: post-procedure vital signs reviewed and stable Respiratory status: spontaneous breathing, nonlabored ventilation, respiratory function stable and patient connected to nasal cannula oxygen Cardiovascular status: stable and blood pressure returned to baseline Postop Assessment: no apparent nausea or vomiting Anesthetic complications: no   No notable events documented.   Last Vitals:  Vitals:   07/31/24 1114 07/31/24 1117  BP: 136/79 134/76  Pulse: 91 84  Resp: 14 13  Temp: (!) 36.2 C (!) 36.2 C  SpO2: 99% 98%    Last Pain:  Vitals:   07/31/24 1117  TempSrc:   PainSc: 0-No pain                 Mahkai Fangman C Niklas Chretien

## 2024-07-31 NOTE — H&P (Signed)
 [facility]   Primary Care Physician:  Theophilus Andrews, Tully GRADE, MD Ophthalmologist: Dr. Feliciano Ober  Pre-Procedure History & Physical: HPI:  [name] is a 88 y.o. male here for cataract surgery.   Past Medical History:  Diagnosis Date   Aortic stenosis    Atrial fibrillation (HCC)    Baker cyst    Left knee   DJD (degenerative joint disease)    knees   Dysrhythmia    Eczema    Erectile dysfunction    Gout    History of hiatal hernia    History of radiation therapy    Right lung- 04/21/21-05/03/21 Dr. Lynwood Nasuti   History of repair of thoracic aortic aneurysm 2006   HTN (hypertension)    Mild mitral regurgitation by prior echocardiogram    Moderate tricuspid regurgitation by prior echocardiogram    MVP (mitral valve prolapse)    Shortness of breath dyspnea    with activity    Past Surgical History:  Procedure Laterality Date    polyps vocal cord  1994   AORTIC VALVE REPLACEMENT  ~2004   BRONCHIAL BIOPSY  03/07/2021   Procedure: BRONCHIAL BIOPSIES;  Surgeon: Shelah Lamar RAMAN, MD;  Location: [facility] ENDOSCOPY;  Service: Pulmonary;;   BRONCHIAL BIOPSY  11/14/2021   Procedure: BRONCHIAL BIOPSIES;  Surgeon: Shelah Lamar RAMAN, MD;  Location: [facility] ENDOSCOPY;  Service: Pulmonary;;   BRONCHIAL BRUSHINGS  03/07/2021   Procedure: BRONCHIAL BRUSHINGS;  Surgeon: Shelah Lamar RAMAN, MD;  Location: [facility] ENDOSCOPY;  Service: Pulmonary;;   BRONCHIAL BRUSHINGS  11/14/2021   Procedure: BRONCHIAL BRUSHINGS;  Surgeon: Shelah Lamar RAMAN, MD;  Location: [facility] ENDOSCOPY;  Service: Pulmonary;;   BRONCHIAL NEEDLE ASPIRATION BIOPSY  03/07/2021   Procedure: BRONCHIAL NEEDLE ASPIRATION BIOPSIES;  Surgeon: Shelah Lamar RAMAN, MD;  Location: [facility] ENDOSCOPY;  Service: Pulmonary;;   BRONCHIAL NEEDLE ASPIRATION BIOPSY  11/14/2021   Procedure: BRONCHIAL NEEDLE ASPIRATION BIOPSIES;  Surgeon: Shelah Lamar RAMAN, MD;  Location: [facility] ENDOSCOPY;  Service: Pulmonary;;   BRONCHIAL WASHINGS  09/21/2020   Procedure: BRONCHIAL  WASHINGS;  Surgeon: Kara Dorn NOVAK, MD;  Location: [facility] ENDOSCOPY;  Service: Pulmonary;;  BAL    BRONCHIAL WASHINGS  03/07/2021   Procedure: BRONCHIAL WASHINGS;  Surgeon: Shelah Lamar RAMAN, MD;  Location: [facility] ENDOSCOPY;  Service: Pulmonary;;   BRONCHIAL WASHINGS  11/14/2021   Procedure: BRONCHIAL WASHINGS;  Surgeon: Shelah Lamar RAMAN, MD;  Location: [facility] ENDOSCOPY;  Service: Pulmonary;;   CATARACT EXTRACTION W/PHACO Left 07/17/2024   Procedure: PHACOEMULSIFICATION, CATARACT, WITH IOL INSERTION 7.36 01:03.9;  Surgeon: Ober Feliciano Hugger, MD;  Location: [facility] SURGERY CN[facility];  Service: Ophthalmology;  Laterality: Left;   FIDUCIAL MARKER PLACEMENT  03/07/2021   Procedure: FIDUCIAL MARKER PLACEMENT;  Surgeon: Shelah Lamar RAMAN, MD;  Location: [facility] ENDOSCOPY;  Service: Pulmonary;;   HERNIA REPAIR  [phone]   HERNIA REPAIR  ~1983   IR ANGIOGRAM PELVIS SELECTIVE OR SUPRASELECTIVE  12/01/2022   IR ANGIOGRAM PELVIS SELECTIVE OR SUPRASELECTIVE  12/01/2022   IR ANGIOGRAM SELECTIVE EACH ADDITIONAL VESSEL  12/01/2022   IR ANGIOGRAM SELECTIVE EACH ADDITIONAL VESSEL  12/01/2022   IR CT SPINE LTD  12/01/2022   IR CT SPINE LTD  12/01/2022   IR CT SPINE LTD  12/01/2022   IR EMBO ARTERIAL NOT HEMORR HEMANG INC GUIDE ROADMAPPING  12/01/2022   IR EMBO TUMOR ORGAN ISCHEMIA INFARCT INC GUIDE ROADMAPPING  12/01/2022   IR RADIOLOGIST EVAL & MGMT  11/17/2022   IR US  GUIDE VASC ACCESS LEFT  12/01/2022  IR US  GUIDE VASC ACCESS RIGHT  12/01/2022   LUMBAR LAMINECTOMY/DECOMPRESSION MICRODISCECTOMY N/A 01/15/2015   Procedure: 2 LEVEL DECOMPRESSIVE LUMBAR LAMINECTOMY L3-L4,L4-L5;  Surgeon: Tanda Heading, MD;  Location: [facility] ORS;  Service: Orthopedics;  Laterality: N/A;   LUMBAR LAMINECTOMY/DECOMPRESSION MICRODISCECTOMY N/A 05/01/2017   Procedure: Lumbar one-Lumbar two, Lumbar two-Lumbar three, Lumbar three-Lumbar four Laminectomy; Evacuation of hematoma;  Surgeon: Lanis Pupa, MD;  Location: [facility] OR;  Service: Neurosurgery;  Laterality: N/A;   right  total hip  ~4 years ago   ROTATOR CUFF REPAIR Left ~2009   TOTAL KNEE ARTHROPLASTY Left 04/14/2015   Procedure: LEFT TOTAL KNEE ARTHROPLASTY;  Surgeon: Tanda Heading, MD;  Location: [facility] ORS;  Service: Orthopedics;  Laterality: Left;   VIDEO BRONCHOSCOPY N/A 09/21/2020   Procedure: VIDEO BRONCHOSCOPY WITHOUT FLUORO;  Surgeon: Kara Dorn NOVAK, MD;  Location: [facility] ENDOSCOPY;  Service: Pulmonary;  Laterality: N/A;   VIDEO BRONCHOSCOPY WITH ENDOBRONCHIAL NAVIGATION Right 03/07/2021   Procedure: VIDEO BRONCHOSCOPY WITH ENDOBRONCHIAL NAVIGATION;  Surgeon: Shelah Lamar RAMAN, MD;  Location: [facility] ENDOSCOPY;  Service: Pulmonary;  Laterality: Right;   VIDEO BRONCHOSCOPY WITH RADIAL ENDOBRONCHIAL ULTRASOUND  11/14/2021   Procedure: VIDEO BRONCHOSCOPY WITH RADIAL ENDOBRONCHIAL ULTRASOUND;  Surgeon: Shelah Lamar RAMAN, MD;  Location: [facility] ENDOSCOPY;  Service: Pulmonary;;    Prior to Admission medications   Medication Sig Start Date End Date Taking? Authorizing Provider  allopurinol  (ZYLOPRIM ) 300 MG tablet Take 1 tablet by mouth once daily 06/10/24   Theophilus Andrews, Estela Y, MD  amLODipine  (NORVASC ) 5 MG tablet Take 1 tablet by mouth once daily 06/10/24   Hernandez Acosta, Estela Y, MD  cyanocobalamin  (VITAMIN B12) 500 MCG tablet Take 1 tablet (500 mcg total) by mouth daily. Patient taking differently: Take 500 mcg by mouth daily. BID 09/22/23   Regalado, Belkys A, MD  feeding supplement (ENSURE PLUS HIGH PROTEIN) LIQD Take 237 mLs by mouth 2 (two) times daily between meals. 04/23/24   Pokhrel, Laxman, MD  furosemide  (LASIX ) 20 MG tablet Take 3 tablets by mouth once daily 07/17/24   Pietro Redell RAMAN, MD  potassium chloride  SA (KLOR-CON  M) 20 MEQ tablet Take 1 tablet by mouth once daily 07/17/24   Pietro Redell RAMAN, MD  predniSONE  (DELTASONE ) 20 MG tablet Take 1 tablet by mouth once daily with breakfast 06/10/24   Theophilus Andrews, Tully GRADE, MD  tamsulosin  (FLOMAX ) 0.4 MG CAPS capsule Take 1 capsule (0.4 mg total) by  mouth daily after supper. 04/23/24   Pokhrel, Laxman, MD  traMADol  (ULTRAM ) 50 MG tablet TAKE 1 TABLET BY MOUTH EVERY 12 HOURS AS NEEDED 07/15/24   Theophilus Andrews, Tully GRADE, MD  traZODone  (DESYREL ) 50 MG tablet TAKE 1 TABLET BY MOUTH AT BEDTIME AS NEEDED Patient taking differently: Take 50 mg by mouth at bedtime as needed for sleep. 03/27/24   Theophilus Andrews, Tully GRADE, MD  warfarin (COUMADIN ) 2.5 MG tablet TAKE 1 TABLET BY MOUTH DAILY EXCEPT TAKE 1 1/2 TABLET ON FRIDAY OR AS DIRECTED BY ANTICOAGULATION CLINIC 07/29/24   Theophilus Andrews, Tully GRADE, MD    Allergies as of 06/06/2024   (No Known Allergies)    Family History  Problem Relation Age of Onset   Diabetes Other    Stroke Other     Social History   Socioeconomic History   Marital status: Divorced    Spouse name: Not on file   Number of children: Not on file   Years of education: Not on file   Highest education level: Not on file  Occupational History   Not on file  Tobacco Use   Smoking status: Former    Current packs/day: 0.00    Types: Cigarettes    Start date: 08/28/1972    Quit date: 08/28/2002    Years since quitting: 21.9   Smokeless tobacco: Never  Vaping Use   Vaping status: Never Used  Substance and Sexual Activity   Alcohol use: Yes    Alcohol/week: 3.0 standard drinks of alcohol    Types: 3 Shots of liquor per week    Comment: social on Fri/Sat   Drug use: No   Sexual activity: Not on file  Other Topics Concern   Not on file  Social History Narrative   Not on file   Social Drivers of Health   Financial Resource Strain: Low Risk  (10/30/2023)   Overall Financial Resource Strain (CARDIA)    Difficulty of Paying Living Expenses: Not hard at all  Food Insecurity: No Food Insecurity (04/19/2024)   Hunger Vital Sign    Worried About Running Out of Food in the Last Year: Never true    Ran Out of Food in the Last Year: Never true  Transportation Needs: No Transportation Needs (04/19/2024)   PRAPARE -  Administrator, Civil Service (Medical): No    Lack of Transportation (Non-Medical): No  Physical Activity: Insufficiently Active (10/30/2023)   Exercise Vital Sign    Days of Exercise per Week: 3 days    Minutes of Exercise per Session: 30 min  Stress: No Stress Concern Present (10/30/2023)   [employer] of Occupational Health - Occupational Stress Questionnaire    Feeling of Stress : Not at all  Social Connections: Moderately Isolated (04/19/2024)   Social Connection and Isolation Panel    Frequency of Communication with Friends and Family: More than three times a week    Frequency of Social Gatherings with Friends and Family: Once a week    Attends Religious Services: More than 4 times per year    Active Member of [employer] or Organizations: No    Attends Banker Meetings: Never    Marital Status: Widowed  Intimate Partner Violence: Not At Risk (04/19/2024)   Humiliation, Afraid, Rape, and Kick questionnaire    Fear of Current or Ex-Partner: No    Emotionally Abused: No    Physically Abused: No    Sexually Abused: No    Review of Systems: See HPI, otherwise negative ROS  Physical Exam: There were no vitals taken for this visit. General:   Alert, cooperative in NAD Head:  Normocephalic and atraumatic. Respiratory:  Normal work of breathing. Cardiovascular:  RRR  Impression/Plan: ALEXAVIER TSUTSUI is here for cataract surgery.  Risks, benefits, limitations, and alternatives regarding cataract surgery have been reviewed with the patient.  Questions have been answered.  All parties agreeable.   Feliciano Bryan Ober, MD  07/31/2024, 7:14 AM

## 2024-07-31 NOTE — Op Note (Signed)
 OPERATIVE NOTE  [name] [id number] 07/31/2024   PREOPERATIVE DIAGNOSIS: Nuclear sclerotic cataract right eye. H25.11   POSTOPERATIVE DIAGNOSIS: Nuclear sclerotic cataract right eye. H25.11   PROCEDURE:  Phacoemulsification with posterior chamber intraocular lens placement of the right eye  Ultrasound time: Procedure(s): PHACOEMULSIFICATION, CATARACT, WITH IOL INSERTION 7.44 01:03.4 (Right)  LENS:   Implant Name Type Inv. Item Serial No. Manufacturer Lot No. LRB No. Used Action  LENS IOL TECNIS MONO 20.5 - [id number] Intraocular Lens LENS IOL TECNIS MONO 20.5 [id number] [employer]  Right 1 Implanted      SURGEON:  Feliciano HERO. Enola, MD   ANESTHESIA:  Topical with tetracaine  drops, augmented with 1% preservative-free intracameral lidocaine .   COMPLICATIONS:  None.   DESCRIPTION OF PROCEDURE:  The patient was identified in the holding room and transported to the operating room and placed in the supine position under the operating microscope.  The right eye was identified as the operative eye, which was prepped and draped in the usual sterile ophthalmic fashion.   A 1 millimeter clear-corneal paracentesis was made superotemporally. Preservative-free 1% lidocaine  mixed with 1:1,000 bisulfite-free aqueous solution of epinephrine  was injected into the anterior chamber. The anterior chamber was then filled with Viscoat viscoelastic. A 2.4 millimeter keratome was used to make a clear-corneal incision inferotemporally. A curvilinear capsulorrhexis was made with a cystotome and capsulorrhexis forceps. Balanced salt  solution was used to hydrodissect and hydrodelineate the nucleus. Phacoemulsification was then used to remove the lens nucleus and epinucleus. The remaining cortex was then removed using the irrigation and aspiration handpiece. Provisc was then placed into the capsular bag to distend it for lens placement. A +20.50 D DCB00 intraocular lens was then injected into the capsular bag. The  remaining viscoelastic was aspirated.   Wounds were hydrated with balanced salt  solution.  The anterior chamber was inflated to a physiologic pressure with balanced salt  solution.  No wound leaks were noted. Moxifloxacin  was injected intracamerally.  Timolol  and Brimonidine  drops were applied to the eye.  The patient was taken to the recovery room in stable condition without complications of anesthesia or surgery.  [employer] 07/31/2024, 11:13 AM

## 2024-07-31 NOTE — Transfer of Care (Signed)
 Immediate Anesthesia Transfer of Care Note  Patient: [name]  Procedure(s) Performed: PHACOEMULSIFICATION, CATARACT, WITH IOL INSERTION 7.44 01:03.4 (Right: Eye)  Patient Location: PACU  Anesthesia Type: MAC  Level of Consciousness: awake, alert  and patient cooperative  Airway and Oxygen Therapy: Patient Spontanous Breathing  Post-op Assessment: Post-op Vital signs reviewed, Patient's Cardiovascular Status Stable, Respiratory Function Stable, Patent Airway and No signs of Nausea or vomiting  Post-op Vital Signs: Reviewed and stable  Complications: No notable events documented.

## 2024-08-08 ENCOUNTER — Emergency Department (HOSPITAL_COMMUNITY)

## 2024-08-08 ENCOUNTER — Inpatient Hospital Stay (HOSPITAL_COMMUNITY)
Admission: EM | Admit: 2024-08-08 | Discharge: 2024-08-14 | Disposition: A | Discharge status: Skilled Nursing Facility | Source: Home / Self Care | Attending: Internal Medicine | Admitting: Internal Medicine

## 2024-08-08 DIAGNOSIS — Z79899 Other long term (current) drug therapy: Secondary | ICD-10-CM

## 2024-08-08 DIAGNOSIS — C3490 Malignant neoplasm of unspecified part of unspecified bronchus or lung: Secondary | ICD-10-CM | POA: Diagnosis present

## 2024-08-08 DIAGNOSIS — Z87891 Personal history of nicotine dependence: Secondary | ICD-10-CM

## 2024-08-08 DIAGNOSIS — I482 Chronic atrial fibrillation, unspecified: Secondary | ICD-10-CM | POA: Diagnosis present

## 2024-08-08 DIAGNOSIS — I5033 Acute on chronic diastolic (congestive) heart failure: Secondary | ICD-10-CM | POA: Diagnosis present

## 2024-08-08 DIAGNOSIS — J432 Centrilobular emphysema: Secondary | ICD-10-CM | POA: Diagnosis not present

## 2024-08-08 DIAGNOSIS — R42 Dizziness and giddiness: Secondary | ICD-10-CM

## 2024-08-08 DIAGNOSIS — Z923 Personal history of irradiation: Secondary | ICD-10-CM

## 2024-08-08 DIAGNOSIS — I350 Nonrheumatic aortic (valve) stenosis: Secondary | ICD-10-CM | POA: Diagnosis present

## 2024-08-08 DIAGNOSIS — Z9841 Cataract extraction status, right eye: Secondary | ICD-10-CM | POA: Diagnosis not present

## 2024-08-08 DIAGNOSIS — J90 Pleural effusion, not elsewhere classified: Secondary | ICD-10-CM | POA: Diagnosis not present

## 2024-08-08 DIAGNOSIS — Z7952 Long term (current) use of systemic steroids: Secondary | ICD-10-CM | POA: Diagnosis not present

## 2024-08-08 DIAGNOSIS — E876 Hypokalemia: Secondary | ICD-10-CM | POA: Diagnosis present

## 2024-08-08 DIAGNOSIS — Z952 Presence of prosthetic heart valve: Secondary | ICD-10-CM | POA: Diagnosis not present

## 2024-08-08 DIAGNOSIS — E871 Hypo-osmolality and hyponatremia: Secondary | ICD-10-CM | POA: Diagnosis present

## 2024-08-08 DIAGNOSIS — F419 Anxiety disorder, unspecified: Secondary | ICD-10-CM | POA: Diagnosis present

## 2024-08-08 DIAGNOSIS — Z9181 History of falling: Secondary | ICD-10-CM

## 2024-08-08 DIAGNOSIS — Z96652 Presence of left artificial knee joint: Secondary | ICD-10-CM | POA: Diagnosis present

## 2024-08-08 DIAGNOSIS — I10 Essential (primary) hypertension: Secondary | ICD-10-CM | POA: Diagnosis not present

## 2024-08-08 DIAGNOSIS — I509 Heart failure, unspecified: Secondary | ICD-10-CM

## 2024-08-08 DIAGNOSIS — Z7901 Long term (current) use of anticoagulants: Secondary | ICD-10-CM | POA: Diagnosis not present

## 2024-08-08 DIAGNOSIS — I110 Hypertensive heart disease with heart failure: Secondary | ICD-10-CM | POA: Diagnosis present

## 2024-08-08 DIAGNOSIS — R296 Repeated falls: Secondary | ICD-10-CM | POA: Diagnosis present

## 2024-08-08 DIAGNOSIS — R0602 Shortness of breath: Secondary | ICD-10-CM | POA: Diagnosis present

## 2024-08-08 DIAGNOSIS — I5032 Chronic diastolic (congestive) heart failure: Secondary | ICD-10-CM | POA: Diagnosis present

## 2024-08-08 DIAGNOSIS — Z85118 Personal history of other malignant neoplasm of bronchus and lung: Secondary | ICD-10-CM

## 2024-08-08 DIAGNOSIS — I951 Orthostatic hypotension: Secondary | ICD-10-CM | POA: Diagnosis present

## 2024-08-08 DIAGNOSIS — Z9842 Cataract extraction status, left eye: Secondary | ICD-10-CM | POA: Diagnosis not present

## 2024-08-08 DIAGNOSIS — I359 Nonrheumatic aortic valve disorder, unspecified: Secondary | ICD-10-CM | POA: Diagnosis present

## 2024-08-08 DIAGNOSIS — Z961 Presence of intraocular lens: Secondary | ICD-10-CM | POA: Diagnosis present

## 2024-08-08 DIAGNOSIS — G4700 Insomnia, unspecified: Secondary | ICD-10-CM

## 2024-08-08 DIAGNOSIS — M150 Primary generalized (osteo)arthritis: Secondary | ICD-10-CM

## 2024-08-08 LAB — CBC
HCT: 39.3 % (ref 39.0–52.0)
Hemoglobin: 12.5 g/dL — ABNORMAL LOW (ref 13.0–17.0)
MCH: 30.6 pg (ref 26.0–34.0)
MCHC: 31.8 g/dL (ref 30.0–36.0)
MCV: 96.3 fL (ref 80.0–100.0)
Platelets: 197 K/uL (ref 150–400)
RBC: 4.08 MIL/uL — ABNORMAL LOW (ref 4.22–5.81)
RDW: 15 % (ref 11.5–15.5)
WBC: 12.1 K/uL — ABNORMAL HIGH (ref 4.0–10.5)
nRBC: 0 % (ref 0.0–0.2)

## 2024-08-08 LAB — APTT: aPTT: 32 s (ref 24–36)

## 2024-08-08 LAB — URINALYSIS, ROUTINE W REFLEX MICROSCOPIC
Bacteria, UA: NONE SEEN
Bilirubin Urine: NEGATIVE
Glucose, UA: NEGATIVE mg/dL
Ketones, ur: 5 mg/dL — AB
Leukocytes,Ua: NEGATIVE
Nitrite: NEGATIVE
Protein, ur: NEGATIVE mg/dL
Specific Gravity, Urine: 1.013 (ref 1.005–1.030)
pH: 6 (ref 5.0–8.0)

## 2024-08-08 LAB — COMPREHENSIVE METABOLIC PANEL WITH GFR
ALT: 60 U/L — ABNORMAL HIGH (ref 0–44)
AST: 93 U/L — ABNORMAL HIGH (ref 15–41)
Albumin: 3.8 g/dL (ref 3.5–5.0)
Alkaline Phosphatase: 100 U/L (ref 38–126)
Anion gap: 14 (ref 5–15)
BUN: 22 mg/dL (ref 8–23)
CO2: 25 mmol/L (ref 22–32)
Calcium: 9.2 mg/dL (ref 8.9–10.3)
Chloride: 101 mmol/L (ref 98–111)
Creatinine, Ser: 1 mg/dL (ref 0.61–1.24)
GFR, Estimated: >60 mL/min (ref >60)
Glucose, Bld: 102 mg/dL — ABNORMAL HIGH (ref 70–99)
Potassium: 3.8 mmol/L (ref 3.5–5.1)
Sodium: 140 mmol/L (ref 135–145)
Total Bilirubin: 1 mg/dL (ref 0.0–1.2)
Total Protein: 6.8 g/dL (ref 6.5–8.1)

## 2024-08-08 LAB — PROTIME-INR
INR: 1.3 — ABNORMAL HIGH (ref 0.8–1.2)
Prothrombin Time: 16.4 s — ABNORMAL HIGH (ref 11.4–15.2)

## 2024-08-08 LAB — PRO BRAIN NATRIURETIC PEPTIDE: Pro Brain Natriuretic Peptide: 31199 pg/mL — ABNORMAL HIGH (ref <300.0)

## 2024-08-08 MED ORDER — FUROSEMIDE 10 MG/ML IJ SOLN
20.0000 mg | Freq: Once | INTRAMUSCULAR | Status: AC
  Administered 2024-08-08: 20 mg via INTRAVENOUS
  Filled 2024-08-08: qty 4

## 2024-08-08 MED ORDER — AMLODIPINE BESYLATE 5 MG PO TABS
5.0000 mg | ORAL_TABLET | Freq: Every day | ORAL | Status: DC
  Administered 2024-08-09 – 2024-08-10 (×2): 5 mg via ORAL
  Filled 2024-08-08 (×2): qty 1

## 2024-08-08 MED ORDER — FUROSEMIDE 10 MG/ML IJ SOLN
40.0000 mg | Freq: Two times a day (BID) | INTRAMUSCULAR | Status: DC
  Administered 2024-08-09 – 2024-08-12 (×7): 40 mg via INTRAVENOUS
  Filled 2024-08-08 (×7): qty 4

## 2024-08-08 MED ORDER — WARFARIN - PHARMACIST DOSING INPATIENT: Freq: Every day | Status: DC

## 2024-08-08 MED ORDER — TAMSULOSIN HCL 0.4 MG PO CAPS
0.4000 mg | ORAL_CAPSULE | Freq: Every day | ORAL | Status: DC
  Administered 2024-08-09 – 2024-08-14 (×6): 0.4 mg via ORAL
  Filled 2024-08-08 (×7): qty 1

## 2024-08-08 MED ORDER — ACETAMINOPHEN 325 MG PO TABS: 650.0000 mg | ORAL_TABLET | ORAL | Status: DC | PRN

## 2024-08-08 MED ORDER — SODIUM CHLORIDE 0.9% FLUSH: 3.0000 mL | INTRAVENOUS | Status: DC | PRN

## 2024-08-08 MED ORDER — SODIUM CHLORIDE 0.9% FLUSH
3.0000 mL | Freq: Two times a day (BID) | INTRAVENOUS | Status: DC
  Administered 2024-08-08 – 2024-08-14 (×12): 3 mL via INTRAVENOUS

## 2024-08-08 MED ORDER — TRAZODONE HCL 100 MG PO TABS
50.0000 mg | ORAL_TABLET | Freq: Every day | ORAL | Status: DC
  Administered 2024-08-08 – 2024-08-13 (×6): 100 mg via ORAL
  Filled 2024-08-08 (×6): qty 1

## 2024-08-08 MED ORDER — SODIUM CHLORIDE 0.9 % IV SOLN: 250.0000 mL | INTRAVENOUS | Status: AC | PRN

## 2024-08-08 MED ORDER — TRAMADOL HCL 50 MG PO TABS: 50.0000 mg | ORAL_TABLET | Freq: Every day | ORAL | Status: DC | PRN

## 2024-08-08 MED ORDER — ONDANSETRON HCL 4 MG/2ML IJ SOLN: 4.0000 mg | Freq: Four times a day (QID) | INTRAMUSCULAR | Status: DC | PRN

## 2024-08-08 MED ORDER — ALLOPURINOL 300 MG PO TABS
300.0000 mg | ORAL_TABLET | Freq: Every day | ORAL | Status: DC
  Administered 2024-08-09 – 2024-08-14 (×6): 300 mg via ORAL
  Filled 2024-08-08 (×5): qty 1
  Filled 2024-08-08: qty 3
  Filled 2024-08-08: qty 1

## 2024-08-08 MED ORDER — WARFARIN SODIUM 2 MG PO TABS
2.0000 mg | ORAL_TABLET | ORAL | Status: AC
  Administered 2024-08-08: 2 mg via ORAL
  Filled 2024-08-08: qty 1

## 2024-08-08 MED ORDER — TRAMADOL HCL 50 MG PO TABS
50.0000 mg | ORAL_TABLET | Freq: Every day | ORAL | Status: DC
  Administered 2024-08-09 – 2024-08-14 (×6): 50 mg via ORAL
  Filled 2024-08-08 (×7): qty 1

## 2024-08-08 MED ORDER — PREDNISONE 20 MG PO TABS
20.0000 mg | ORAL_TABLET | Freq: Every day | ORAL | Status: DC
  Administered 2024-08-09 – 2024-08-14 (×6): 20 mg via ORAL
  Filled 2024-08-08 (×6): qty 1

## 2024-08-08 MED FILL — Potassium Chloride Microencapsulated Crys ER Tab 20 mEq: 20.0000 meq | ORAL | Qty: 1 | Status: AC

## 2024-08-08 NOTE — Progress Notes (Addendum)
 PHARMACY - ANTICOAGULATION CONSULT NOTE  Pharmacy Consult for warfarin Indication: atrial fibrillation  Allergies[1]  Patient Measurements:    Vital Signs: Temp: 97.8 F (36.6 C) (12/12 1700) Temp Source: Oral (12/12 1700) BP: 131/84 (12/12 1700) Pulse Rate: 89 (12/12 1705)  Labs: Recent Labs    08/08/24 1316 08/08/24 1704  HGB 12.5*  --   HCT 39.3  --   PLT 197  --   APTT  --  32  LABPROT  --  16.4*  INR  --  1.3*  CREATININE 1.00  --     Estimated Creatinine Clearance: 47.9 mL/min (by C-G formula based on SCr of 1 mg/dL).   Medical History: Past Medical History:  Diagnosis Date   Aortic stenosis    Atrial fibrillation (HCC)    Baker cyst    Left knee   DJD (degenerative joint disease)    knees   Dysrhythmia    Eczema    Erectile dysfunction    Gout    History of hiatal hernia    History of radiation therapy    Right lung- 04/21/21-05/03/21 Dr. Lynwood Nasuti   History of repair of thoracic aortic aneurysm 2006   HTN (hypertension)    Mild mitral regurgitation by prior echocardiogram    Moderate tricuspid regurgitation by prior echocardiogram    MVP (mitral valve prolapse)    Shortness of breath dyspnea    with activity    Medications:  Per anti-coag visit on12/2/25 Warfarin 3.75mg  on Fridays and 2.5mg  all other days  Assessment: 88 yo male presents with SOB and falls, pharmacy to dose warfarin for Afib. LD warfarin 3.75mg  today  INR 1.3, Hgb 12.5, plts 197  Goal of Therapy:  INR 2-2.5 per anti-coag clinic   Plan:  Warfarin 2mg  x 1 Daily INR  Leeroy Mace RPh 08/08/2024, 9:12 PM      [1] No Known Allergies

## 2024-08-08 NOTE — ED Notes (Signed)
 MD notified of pt getting dizzy during orthostatics, and not being able to stand for them.

## 2024-08-08 NOTE — H&P (Signed)
 History and Physical    Patient: [name] FMW:[id number] DOB: Jun 08, 1936 DOA: 08/08/2024 DOS: the patient was seen and examined on 08/08/2024 PCP: Theophilus Andrews, Tully GRADE, MD  Patient coming from: Home  Chief Complaint:  Chief Complaint  Patient presents with   Fall   Dizziness   Shortness of Breath   HPI: [name] is a 88 y.o. male with medical history significant of aortic stenosis status post TAVR on warfarin, chronic atrial fibrillation, history of repair of thoracic aortic aneurysm, essential hypertension, history of mitral valve prolapse, who presented to the ER with shortness of breath fall and dizziness.  Patient had a fall at home apparently.  Was brought in by daughter who states he normally uses a walker but has been dizzy she having shortness of breath and weak.  He denied any chills.  He denied any complaint.  Patient appears to have known diastolic dysfunction CHF, he is evaluated and seems to have evidence of fluid overload in general.  He is being admitted with acute exacerbation of CHF.  Spread of more oxygen than usual.  He is on chronic warfarin.  Review of Systems: As mentioned in the history of present illness. All other systems reviewed and are negative. Past Medical History:  Diagnosis Date   Aortic stenosis    Atrial fibrillation (HCC)    Baker cyst    Left knee   DJD (degenerative joint disease)    knees   Dysrhythmia    Eczema    Erectile dysfunction    Gout    History of hiatal hernia    History of radiation therapy    Right lung- 04/21/21-05/03/21 Dr. Lynwood Nasuti   History of repair of thoracic aortic aneurysm 2006   HTN (hypertension)    Mild mitral regurgitation by prior echocardiogram    Moderate tricuspid regurgitation by prior echocardiogram    MVP (mitral valve prolapse)    Shortness of breath dyspnea    with activity   Past Surgical History:  Procedure Laterality Date    polyps vocal cord  1994   AORTIC VALVE REPLACEMENT   ~2004   BRONCHIAL BIOPSY  03/07/2021   Procedure: BRONCHIAL BIOPSIES;  Surgeon: Shelah Lamar RAMAN, MD;  Location: [facility] ENDOSCOPY;  Service: Pulmonary;;   BRONCHIAL BIOPSY  11/14/2021   Procedure: BRONCHIAL BIOPSIES;  Surgeon: Shelah Lamar RAMAN, MD;  Location: [facility] ENDOSCOPY;  Service: Pulmonary;;   BRONCHIAL BRUSHINGS  03/07/2021   Procedure: BRONCHIAL BRUSHINGS;  Surgeon: Shelah Lamar RAMAN, MD;  Location: [facility] ENDOSCOPY;  Service: Pulmonary;;   BRONCHIAL BRUSHINGS  11/14/2021   Procedure: BRONCHIAL BRUSHINGS;  Surgeon: Shelah Lamar RAMAN, MD;  Location: [facility] ENDOSCOPY;  Service: Pulmonary;;   BRONCHIAL NEEDLE ASPIRATION BIOPSY  03/07/2021   Procedure: BRONCHIAL NEEDLE ASPIRATION BIOPSIES;  Surgeon: Shelah Lamar RAMAN, MD;  Location: [facility] ENDOSCOPY;  Service: Pulmonary;;   BRONCHIAL NEEDLE ASPIRATION BIOPSY  11/14/2021   Procedure: BRONCHIAL NEEDLE ASPIRATION BIOPSIES;  Surgeon: Shelah Lamar RAMAN, MD;  Location: [facility] ENDOSCOPY;  Service: Pulmonary;;   BRONCHIAL WASHINGS  09/21/2020   Procedure: BRONCHIAL WASHINGS;  Surgeon: Kara Dorn NOVAK, MD;  Location: [facility] ENDOSCOPY;  Service: Pulmonary;;  BAL    BRONCHIAL WASHINGS  03/07/2021   Procedure: BRONCHIAL WASHINGS;  Surgeon: Shelah Lamar RAMAN, MD;  Location: [facility] ENDOSCOPY;  Service: Pulmonary;;   BRONCHIAL WASHINGS  11/14/2021   Procedure: BRONCHIAL WASHINGS;  Surgeon: Shelah Lamar RAMAN, MD;  Location: [facility] ENDOSCOPY;  Service: Pulmonary;;   CATARACT EXTRACTION W/PHACO Left 07/17/2024  Procedure: PHACOEMULSIFICATION, CATARACT, WITH IOL INSERTION 7.36 01:03.9;  Surgeon: Enola Feliciano Hugger, MD;  Location: [facility] SURGERY CNTR;  Service: Ophthalmology;  Laterality: Left;   CATARACT EXTRACTION W/PHACO Right 07/31/2024   Procedure: PHACOEMULSIFICATION, CATARACT, WITH IOL INSERTION 7.44 01:03.4;  Surgeon: Enola Feliciano Hugger, MD;  Location: [facility] SURGERY [facility];  Service: Ophthalmology;  Laterality: Right;   FIDUCIAL MARKER PLACEMENT  03/07/2021   Procedure: FIDUCIAL MARKER PLACEMENT;   Surgeon: Shelah Lamar RAMAN, MD;  Location: [facility] ENDOSCOPY;  Service: Pulmonary;;   HERNIA REPAIR  [phone]   HERNIA REPAIR  [phone]   IR ANGIOGRAM PELVIS SELECTIVE OR SUPRASELECTIVE  12/01/2022   IR ANGIOGRAM PELVIS SELECTIVE OR SUPRASELECTIVE  12/01/2022   IR ANGIOGRAM SELECTIVE EACH ADDITIONAL VESSEL  12/01/2022   IR ANGIOGRAM SELECTIVE EACH ADDITIONAL VESSEL  12/01/2022   IR CT SPINE LTD  12/01/2022   IR CT SPINE LTD  12/01/2022   IR CT SPINE LTD  12/01/2022   IR EMBO ARTERIAL NOT HEMORR HEMANG INC GUIDE ROADMAPPING  12/01/2022   IR EMBO TUMOR ORGAN ISCHEMIA INFARCT INC GUIDE ROADMAPPING  12/01/2022   IR RADIOLOGIST EVAL & MGMT  11/17/2022   IR US  GUIDE VASC ACCESS LEFT  12/01/2022   IR US  GUIDE VASC ACCESS RIGHT  12/01/2022   LUMBAR LAMINECTOMY/DECOMPRESSION MICRODISCECTOMY N/A 01/15/2015   Procedure: 2 LEVEL DECOMPRESSIVE LUMBAR LAMINECTOMY L3-L4,L4-L5;  Surgeon: Tanda Heading, MD;  Location: [facility] ORS;  Service: Orthopedics;  Laterality: N/A;   LUMBAR LAMINECTOMY/DECOMPRESSION MICRODISCECTOMY N/A 05/01/2017   Procedure: Lumbar one-Lumbar two, Lumbar two-Lumbar three, Lumbar three-Lumbar four Laminectomy; Evacuation of hematoma;  Surgeon: Lanis Pupa, MD;  Location: [facility] OR;  Service: Neurosurgery;  Laterality: N/A;   right total hip  ~4 years ago   ROTATOR CUFF REPAIR Left ~2009   TOTAL KNEE ARTHROPLASTY Left 04/14/2015   Procedure: LEFT TOTAL KNEE ARTHROPLASTY;  Surgeon: Tanda Heading, MD;  Location: [facility] ORS;  Service: Orthopedics;  Laterality: Left;   VIDEO BRONCHOSCOPY N/A 09/21/2020   Procedure: VIDEO BRONCHOSCOPY WITHOUT FLUORO;  Surgeon: Kara Dorn NOVAK, MD;  Location: [facility] ENDOSCOPY;  Service: Pulmonary;  Laterality: N/A;   VIDEO BRONCHOSCOPY WITH ENDOBRONCHIAL NAVIGATION Right 03/07/2021   Procedure: VIDEO BRONCHOSCOPY WITH ENDOBRONCHIAL NAVIGATION;  Surgeon: Shelah Lamar RAMAN, MD;  Location: [facility] ENDOSCOPY;  Service: Pulmonary;  Laterality: Right;   VIDEO BRONCHOSCOPY WITH RADIAL ENDOBRONCHIAL ULTRASOUND   11/14/2021   Procedure: VIDEO BRONCHOSCOPY WITH RADIAL ENDOBRONCHIAL ULTRASOUND;  Surgeon: Shelah Lamar RAMAN, MD;  Location: [facility] ENDOSCOPY;  Service: Pulmonary;;   Social History:  reports that he quit smoking about 21 years ago. His smoking use included cigarettes. He started smoking about 51 years ago. He has never used smokeless tobacco. He reports current alcohol use of about 3.0 standard drinks of alcohol per week. He reports that he does not use drugs.  Allergies[1]  Family History  Problem Relation Age of Onset   Diabetes Other    Stroke Other     Prior to Admission medications  Medication Sig Start Date End Date Taking? Authorizing Provider  allopurinol  (ZYLOPRIM ) 300 MG tablet Take 1 tablet by mouth once daily 06/10/24   Theophilus Andrews, Estela Y, MD  amLODipine  (NORVASC ) 5 MG tablet Take 1 tablet by mouth once daily 06/10/24   Theophilus Andrews, Estela Y, MD  cyanocobalamin  (VITAMIN B12) 500 MCG tablet Take 1 tablet (500 mcg total) by mouth daily. Patient taking differently: Take 500 mcg by mouth daily. BID 09/22/23   Regalado, Belkys A, MD  feeding supplement (ENSURE PLUS HIGH PROTEIN)  LIQD Take 237 mLs by mouth 2 (two) times daily between meals. 04/23/24   Pokhrel, Vernal, MD  furosemide  (LASIX ) 20 MG tablet Take 3 tablets by mouth once daily 07/17/24   Pietro Redell RAMAN, MD  potassium chloride  SA (KLOR-CON  M) 20 MEQ tablet Take 1 tablet by mouth once daily 07/17/24   Pietro Redell RAMAN, MD  predniSONE  (DELTASONE ) 20 MG tablet Take 1 tablet by mouth once daily with breakfast 06/10/24   Theophilus Andrews, Tully GRADE, MD  tamsulosin  (FLOMAX ) 0.4 MG CAPS capsule Take 1 capsule (0.4 mg total) by mouth daily after supper. 04/23/24   Pokhrel, Vernal, MD  traMADol  (ULTRAM ) 50 MG tablet TAKE 1 TABLET BY MOUTH EVERY 12 HOURS AS NEEDED 07/15/24   Theophilus Andrews, Tully GRADE, MD  traZODone  (DESYREL ) 50 MG tablet TAKE 1 TABLET BY MOUTH AT BEDTIME AS NEEDED Patient taking differently: Take 50 mg by  mouth at bedtime as needed for sleep. 03/27/24   Theophilus Andrews, Tully GRADE, MD  warfarin (COUMADIN ) 2.5 MG tablet TAKE 1 TABLET BY MOUTH DAILY EXCEPT TAKE 1 1/2 TABLET ON FRIDAY OR AS DIRECTED BY ANTICOAGULATION CLINIC 07/29/24   Theophilus Andrews, Tully GRADE, MD    Physical Exam: Vitals:   08/08/24 1300 08/08/24 1543 08/08/24 1700 08/08/24 1705  BP: 124/74 132/84 131/84   Pulse: 88 80 89 89  Resp: (!) 22 19 18 18   Temp:  (!) 97.4 F (36.3 C) 97.8 F (36.6 C)   TempSrc:  Oral Oral   SpO2: 96% 98% 94% 98%   Constitutional: Chronically ill looking, weak, pleasant, NAD, calm, comfortable Eyes: PERRL, lids and conjunctivae normal ENMT: Mucous membranes are moist. Posterior pharynx clear of any exudate or lesions.Normal dentition.  Neck: normal, supple, no masses, no thyromegaly Respiratory: Coarse breath sound bilaterally, no wheeze but diffuse crackles normal respiratory effort. No accessory muscle use.  Cardiovascular: Regular rate and rhythm, no murmurs / rubs / gallops.  1+ extremity edema. 2+ pedal pulses. No carotid bruits.  Abdomen: no tenderness, no masses palpated. No hepatosplenomegaly. Bowel sounds positive.  Musculoskeletal: Good range of motion, no joint swelling or tenderness, Skin: Diffuse bruises bilateral the lower extremities lesions, ulcers. No induration Neurologic: CN 2-12 grossly intact. Sensation intact, DTR normal. Strength 5/5 in all 4.  Psychiatric: Normal judgment and insight. Alert and oriented x 3. Normal mood  Data Reviewed:  Temp 27.4, blood pressure 107/71, pulse 52 respiratory rate of 25.  85% on room air, white count 12.9 hemoglobin 12.5, proBNP is 31,000 199, AST 93 ALT of 60, PT 16.4 INR 1.3 urinalysis essentially negative chest x-ray showed minimal bibasilar segmental atelectasis.  CT cervical spine showed no acute traumatic injury there is severe multilevel degenerative changes are partially visualized bilateral pleural effusion CT chest without contrast  showed no rib fractures.  There is moderate to large bilateral pleural effusion has associated compressive atelectasis which is increased from prior.  Evidence of emphysema and substantial cardiomegaly with aortic valve prosthesis.  Also mild left upper lobe atelectasis.  Head CT without contrast showed no acute findings  Assessment and Plan:  #1 acute exacerbation of diastolic CHF: Patient appears to have significant pleural effusion which may explain his shortness of breath and mild hypoxia.  Currently on 2 L of oxygen.  Patient will be aggressively diuresed.  If no improvement may need to go through with thoracentesis.  In the meantime echocardiogram was done August.  Will consider repeat for possible cardiology consultation.  Continue other home regimen.  #2  multiple falls at home: Due to gait abnormalities and generalized weakness probably from acute illness.  Patient on warfarin and has multiple skin bruises.  We will get PT and OT consultation prior to discharge.  #3 history of AVR: On chronic warfarin.  We will continue warfarin with INR checks.  Dosing per pharmacy.  #4 non-small cell lung cancer: Appears to be in remission.  Continue monitoring.  #5 essential hypertension: Blood pressure at this point was controlled.  Resume home regimen.  #6 COPD: No acute exacerbation.  Continue to monitor.    Advance Care Planning:   Code Status: Full Code   Consults: None but may consult cardiology in the morning  Family Communication: Daughter at bedside  Severity of Illness: The appropriate patient status for this patient is INPATIENT. Inpatient status is judged to be reasonable and necessary in order to provide the required intensity of service to ensure the patient's safety. The patient's presenting symptoms, physical exam findings, and initial radiographic and laboratory data in the context of their chronic comorbidities is felt to place them at high risk for further clinical deterioration.  Furthermore, it is not anticipated that the patient will be medically stable for discharge from the hospital within 2 midnights of admission.   * I certify that at the point of admission it is my clinical judgment that the patient will require inpatient hospital care spanning beyond 2 midnights from the point of admission due to high intensity of service, high risk for further deterioration and high frequency of surveillance required.*  AuthorBETHA SIM KNOLL, MD 08/08/2024 8:23 PM  For on call review [email or web address].      [1] No Known Allergies

## 2024-08-08 NOTE — ED Triage Notes (Signed)
 Patient brought in today by daughter reporting multiple falls - uses walker, sob, and dizziness. Reports blood thinner for afib.

## 2024-08-08 NOTE — ED Provider Notes (Signed)
 [facility] AT [facility] Provider Note   CSN: [id number] Arrival date & time: 08/08/24  1204     Patient presents with: Fall, Dizziness, and Shortness of Breath   [name] is a 88 y.o. male.    Fall Associated symptoms include shortness of breath.  Dizziness Associated symptoms: shortness of breath   Shortness of Breath   Patient presents because of episodes of dizziness.  Falls.  Has had falls x 2 over the past 2 days.  Lives at home alone.  Patient states he will stand and up and subsequent become lightheaded with vertigo and subsequently fall.  Patient states he does not think he lost conscious.  Does not think he hit his head.  Takes warfarin in setting of A-fib.  Denies any chest pain.  Does endorse some shortness of breath.  No nausea vomit diarrhea.  Been tolerating p.o.  No numbness or tingling wear.  No diplopia or vision changes.  No cervical thoracic or lumbar pain.  Daughter brought him in for further evaluation.     Previous medical history reviewed : Patient was last admitted in August 2025.  Fall at home.  Syncope.  Hyponatremia.  Chronic diastolic CHF.  Mild hypokalemia.   Prior to Admission medications  Medication Sig Start Date End Date Taking? Authorizing Provider  allopurinol  (ZYLOPRIM ) 300 MG tablet Take 1 tablet by mouth once daily 06/10/24   Theophilus Andrews, Estela Y, MD  amLODipine  (NORVASC ) 5 MG tablet Take 1 tablet by mouth once daily 06/10/24   Theophilus Andrews, Estela Y, MD  cyanocobalamin  (VITAMIN B12) 500 MCG tablet Take 1 tablet (500 mcg total) by mouth daily. Patient taking differently: Take 500 mcg by mouth daily. BID 09/22/23   Regalado, Belkys A, MD  feeding supplement (ENSURE PLUS HIGH PROTEIN) LIQD Take 237 mLs by mouth 2 (two) times daily between meals. 04/23/24   Pokhrel, Vernal, MD  furosemide  (LASIX ) 20 MG tablet Take 3 tablets by mouth once daily 07/17/24   Pietro Redell RAMAN, MD  potassium chloride   SA (KLOR-CON  M) 20 MEQ tablet Take 1 tablet by mouth once daily 07/17/24   Pietro Redell RAMAN, MD  predniSONE  (DELTASONE ) 20 MG tablet Take 1 tablet by mouth once daily with breakfast 06/10/24   Theophilus Andrews, Tully GRADE, MD  tamsulosin  (FLOMAX ) 0.4 MG CAPS capsule Take 1 capsule (0.4 mg total) by mouth daily after supper. 04/23/24   Pokhrel, Vernal, MD  traMADol  (ULTRAM ) 50 MG tablet TAKE 1 TABLET BY MOUTH EVERY 12 HOURS AS NEEDED 07/15/24   Theophilus Andrews, Tully GRADE, MD  traZODone  (DESYREL ) 50 MG tablet TAKE 1 TABLET BY MOUTH AT BEDTIME AS NEEDED Patient taking differently: Take 50 mg by mouth at bedtime as needed for sleep. 03/27/24   Theophilus Andrews, Tully GRADE, MD  warfarin (COUMADIN ) 2.5 MG tablet TAKE 1 TABLET BY MOUTH DAILY EXCEPT TAKE 1 1/2 TABLET ON FRIDAY OR AS DIRECTED BY ANTICOAGULATION CLINIC 07/29/24   Theophilus Andrews, Tully GRADE, MD    Allergies: Patient has no known allergies.    Review of Systems  Respiratory:  Positive for shortness of breath.   Neurological:  Positive for dizziness.    Updated Vital Signs BP 132/84 (BP Location: Left Arm)   Pulse 80   Temp (!) 97.4 F (36.3 C) (Oral)   Resp 19   SpO2 98%   Physical Exam Vitals and nursing note reviewed.  Constitutional:      General: He is not in acute  distress.    Appearance: He is well-developed.  HENT:     Head: Normocephalic and atraumatic.  Eyes:     Conjunctiva/sclera: Conjunctivae normal.  Cardiovascular:     Rate and Rhythm: Normal rate and regular rhythm.     Heart sounds: No murmur heard. Pulmonary:     Effort: Pulmonary effort is normal. No respiratory distress.     Breath sounds: Normal breath sounds.  Abdominal:     Palpations: Abdomen is soft.     Tenderness: There is no abdominal tenderness.  Musculoskeletal:        General: No swelling.     Cervical back: Neck supple.  Skin:    General: Skin is warm and dry.     Capillary Refill: Capillary refill takes less than 2 seconds.  Neurological:      Mental Status: He is alert.  Psychiatric:        Mood and Affect: Mood normal.     (all labs ordered are listed, but only abnormal results are displayed) Labs Reviewed  COMPREHENSIVE METABOLIC PANEL WITH GFR - Abnormal; Notable for the following components:      Result Value   Glucose, Bld 102 (*)    AST 93 (*)    ALT 60 (*)    All other components within normal limits  CBC - Abnormal; Notable for the following components:   WBC 12.1 (*)    RBC 4.08 (*)    Hemoglobin 12.5 (*)    All other components within normal limits  URINALYSIS, ROUTINE W REFLEX MICROSCOPIC - Abnormal; Notable for the following components:   Hgb urine dipstick SMALL (*)    Ketones, ur 5 (*)    All other components within normal limits  PRO BRAIN NATRIURETIC PEPTIDE - Abnormal; Notable for the following components:   Pro Brain Natriuretic Peptide 31,199.0 (*)    All other components within normal limits  PROTIME-INR  APTT  CBG MONITORING, ED    EKG: None  Radiology: CT Chest Wo Contrast Result Date: 08/08/2024 EXAM: CT CHEST WITHOUT CONTRAST 08/08/2024 03:29:53 PM TECHNIQUE: CT of the chest was performed without the administration of intravenous contrast. Multiplanar reformatted images are provided for review. Automated exposure control, iterative reconstruction, and/or weight based adjustment of the mA/kV was utilized to reduce the radiation dose to as low as reasonably achievable. COMPARISON: 06/02/2024 CLINICAL HISTORY: Evaluation for rib fractures right side as well as infiltrate. FINDINGS: MEDIASTINUM: Thoracic aortic, coronary artery, and branch vessel atheromatous vascular calcifications. Substantial cardiomegaly. Aortic valve prosthesis. Mitral valve calcifications. Prominent main pulmonary artery suggests pulmonary arterial hypertension. The central airways are clear. LYMPH NODES: Scattered small mediastinal lymph nodes. Right hilar node 1.4 cm in short axis on image 73 series 504, previously the  same. No axillary lymphadenopathy. LUNGS AND PLEURA: Moderate to large bilateral pleural effusions with associated passive atelectasis, increased in volume from prior. Stable bandlike right apical scarring anteriorly with marginal fiducials. Emphysema. Airway thickening suggest bronchitis or reactive airways disease. Mild atelectasis posteriorly in the left upper lobe along the major fissure. Left upper lobe subsolid nodule noted on the prior exam is not readily visible on todays exam although is in a region of motion artifact. No pneumothorax. SOFT TISSUES/BONES: Severe degenerative shoulder arthropathy noted. No rib fracture identified. Thoracic spondylosis with multilevel bridging osteophytes. Motion artifact is present, reducing diagnostic sensitivity and specificity. UPPER ABDOMEN: Limited images of the upper abdomen demonstrates abdominal aortic atherosclerosis. IMPRESSION: 1. No acute rib fracture identified. 2. Moderate to large bilateral  pleural effusions with associated passive atelectasis, increased from prior. 3. Emphysema with airway thickening consistent with bronchitis or reactive airways disease. 4. Substantial cardiomegaly with aortic valve prosthesis, mitral valve calcifications, and prominent main pulmonary artery suggesting pulmonary arterial hypertension. 5. Mild left upper lobe atelectasis along the major fissure. 6. Severe degenerative shoulder arthropathy and thoracic spondylosis with multilevel bridging osteophytes. 7. Stable irregular bandlike opacity at the right lung apex with adjacent fiducials. Electronically signed by: Ryan Salvage MD 08/08/2024 04:01 PM EST RP Workstation: [id number]   DG Chest Portable 1 View Result Date: 08/08/2024 CLINICAL DATA:  Shortness of breath EXAM: PORTABLE CHEST 1 VIEW COMPARISON:  April 20, 2024 FINDINGS: Stable cardiomegaly. Status post aortic valve repair. Minimal bibasilar subsegmental atelectasis. Degenerative changes are seen involving both  shoulder joints. IMPRESSION: Minimal bibasilar subsegmental atelectasis. Electronically Signed   By: Lynwood Landy Raddle M.D.   On: 08/08/2024 15:52   CT Cervical Spine Wo Contrast Result Date: 08/08/2024 EXAM: CT CERVICAL SPINE WITHOUT CONTRAST 08/08/2024 03:29:53 PM TECHNIQUE: CT of the cervical spine was performed without the administration of intravenous contrast. Multiplanar reformatted images are provided for review. Automated exposure control, iterative reconstruction, and/or weight based adjustment of the mA/kV was utilized to reduce the radiation dose to as low as reasonably achievable. COMPARISON: 518 2025. CLINICAL HISTORY: Polytrauma, blunt. FINDINGS: CERVICAL SPINE: BONES AND ALIGNMENT: Similar alignment of the cervical spine. Trace degenerative retrolisthesis of C3 on C4 as well as trace degenerative anterolisthesis of C4 on C5 and C7 on T1 are unchanged. No evidence of traumatic malalignment. Similar alignment at the atlantoaxial articulations. Findings suggestive of mild basilar infarct. Degenerative soft tissue mineralization along the dorsal aspect of the dens slightly indenting the ventral thecal sac. DEGENERATIVE CHANGES: Severe disc space narrowing at C3-C4, C4-C5, C5-C6, and C6-C7. Disc osteophyte complexes at multiple levels. 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 Mild spinal canal stenosis at C3-C4, C5-C6, and C6-C7. There is no evidence of high grade osseous spinal canal stenosis. Arthrosis and uncovertebral hypertrophy with foraminal stenosis at multiple levels throughout the cervical spine, overall most pronounced at C3-C4 and C6-C7. SOFT TISSUES: No prevertebral soft tissue swelling. LUNGS AND PLEURA: Partially visualized bilateral pleural effusions. Scarring in the right lung apex. Emphysema. IMPRESSION: 1. No acute traumatic abnormality of the cervical spine. 2. Severe multilevel degenerative changes as above. 3.  Partially visualized bilateral pleural effusions. Electronically signed by: Donnice Mania MD 08/08/2024 03:44 PM EST RP Workstation: [id number]   CT Head Wo Contrast Result Date: 08/08/2024 EXAM: CT HEAD WITHOUT 08/08/2024 03:29:53 PM TECHNIQUE: CT of the head was performed without the administration of intravenous contrast. Automated exposure control, iterative reconstruction, and/or weight based adjustment of the mA/kV was utilized to reduce the radiation dose to as low as reasonably achievable. COMPARISON: 04/19/2024 CLINICAL HISTORY: Polytrauma, blunt FINDINGS: BRAIN AND VENTRICLES: No acute intracranial hemorrhage. No mass effect or midline shift. No extra-axial fluid collection. Similar appearance of mild parenchymal volume loss. Similar moderate chronic microvascular ischemic changes. 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 Atherosclerosis of the carotid siphons and intracranial vertebral arteries similar to prior. No hydrocephalus. ORBITS: Bilateral lens replacement similar to prior. SINUSES AND MASTOIDS: No acute abnormality. SOFT TISSUES AND SKULL: No acute skull fracture. Similar appearance of calcified atherosclerosis of multiple scalp vessels. IMPRESSION: 1. No acute intracranial abnormality. 2. Mild parenchymal volume loss and moderate chronic microvascular ischemic changes, unchanged from prior. 3. Small remote lacunar infarct  in the left caudate head, unchanged from prior. Electronically signed by: Donnice Mania MD 08/08/2024 03:37 PM EST RP Workstation: [id number]     Procedures   Medications Ordered in the ED  furosemide  (LASIX ) injection 20 mg (20 mg Intravenous Given 08/08/24 1657)                                    Medical Decision Making Amount and/or Complexity of Data Reviewed Labs: ordered. Radiology: ordered.  Risk Prescription drug management.     HPI:   Patient presents because of episodes of dizziness.  Falls.  Has had falls x 2  over the past 2 days.  Lives at home alone.  Patient states he will stand and up and subsequent become lightheaded with vertigo and subsequently fall.  Patient states he does not think he lost conscious.  Does not think he hit his head.  Takes warfarin in setting of A-fib.  Denies any chest pain.  Does endorse some shortness of breath.  No nausea vomit diarrhea.  Been tolerating p.o.  No numbness or tingling wear.  No diplopia or vision changes.  No cervical thoracic or lumbar pain.  Daughter brought him in for further evaluation.     Previous medical history reviewed : Patient was last admitted in August 2025.  Fall at home.  Syncope.  Hyponatremia.  Chronic diastolic CHF.  Mild hypokalemia.   MDM:   Upon exam, patient hemodynamic stable.  ANO x 3 GCS 15.  No focal deficits.   Lung sounds decreased bilaterally.  Lower lungs.  Asked the nursing staff to try and get orthostatics on the patient.  Unable to do so.  Patient maps were appropriate once sitting down but whenever he tried to stand up he become very symptomatic and would have to sit back down.  Somewhat equivocal orthostatic test given that maps could not be obtained.  Will obtain laboratory workup.  Obtain BMP to assess for any volume status given history of CHF.  Obtain CT head and CT cervical spine because of fall rule out subdural epidural.  Also obtaining CT chest given some right sided rib pain where he fell.  On assessment, large pleural effusions and/or rib fracture.  Reevaluation:   Upon reexamination, patient hemodynamically stable.    CT head shows no evidence of bleed.  CT cervical spine benign  CT T chest does show large pleural effusions.  Explains patient's increasing shortness of breath.  Patient states he cannot really walk at all because of increasing shortness of breath.  I think these effusions explained this.  PE elevated to greater than 31,000.  Started patient on Lasix  IV.  Will need observation for ongoing  diuresis.  In terms of patient's vertigo.  CT head benign.  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  I think this is more likely to do with peripheral cause but if patient continues remain symptomatic we will need MRI brain.  Will be admitted to medicine.    Interventions: lasix    EKG Interpreted by Me: sinus    Cardiac Tele Interpreted by Me: sinus    I have independently interpreted the CXR  and CT  images and agree with the radiologist finding   Social Determinant of Health: Past tobacco abuse    Disposition and Follow Up: admit       Final diagnoses:  Shortness of breath  Pleural effusion  Vertigo    ED Discharge  Orders     None          Simon Lavonia SAILOR, MD 08/08/24 [phone]

## 2024-08-09 ENCOUNTER — Encounter (HOSPITAL_COMMUNITY): Payer: Self-pay | Admitting: Internal Medicine

## 2024-08-09 LAB — PROTIME-INR
INR: 1.5 — ABNORMAL HIGH (ref 0.8–1.2)
Prothrombin Time: 18.5 s — ABNORMAL HIGH (ref 11.4–15.2)

## 2024-08-09 LAB — BASIC METABOLIC PANEL WITH GFR
Anion gap: 11 (ref 5–15)
BUN: 21 mg/dL (ref 8–23)
CO2: 28 mmol/L (ref 22–32)
Calcium: 8.8 mg/dL — ABNORMAL LOW (ref 8.9–10.3)
Chloride: 106 mmol/L (ref 98–111)
Creatinine, Ser: 0.99 mg/dL (ref 0.61–1.24)
GFR, Estimated: >60 mL/min (ref >60)
Glucose, Bld: 91 mg/dL (ref 70–99)
Potassium: 3.6 mmol/L (ref 3.5–5.1)
Sodium: 144 mmol/L (ref 135–145)

## 2024-08-09 MED ORDER — ALBUTEROL SULFATE (2.5 MG/3ML) 0.083% IN NEBU: 3.0000 mL | INHALATION_SOLUTION | RESPIRATORY_TRACT | Status: DC | PRN

## 2024-08-09 MED ORDER — WARFARIN SODIUM 2.5 MG PO TABS
2.5000 mg | ORAL_TABLET | Freq: Once | ORAL | Status: AC
  Administered 2024-08-09: 2.5 mg via ORAL
  Filled 2024-08-09 (×2): qty 1

## 2024-08-09 MED ADMIN — Potassium Chloride Microencapsulated Crys ER Tab 20 mEq: 20 meq | ORAL | NDC 00245531989

## 2024-08-09 NOTE — Progress Notes (Signed)
 PHARMACY - ANTICOAGULATION CONSULT NOTE  Pharmacy Consult for warfarin Indication: atrial fibrillation  Allergies[1]  Patient Measurements:    Vital Signs: Temp: 97.5 F (36.4 C) (12/13 0907) Temp Source: Oral (12/13 0907) BP: 175/85 (12/13 0907) Pulse Rate: 106 (12/13 0907)  Labs: Recent Labs    08/08/24 1316 08/08/24 1704 08/09/24 0527  HGB 12.5*  --   --   HCT 39.3  --   --   PLT 197  --   --   APTT  --  32  --   LABPROT  --  16.4* 18.5*  INR  --  1.3* 1.5*  CREATININE 1.00  --  0.99    Estimated Creatinine Clearance: 48.3 mL/min (by C-G formula based on SCr of 0.99 mg/dL).   Medical History: Past Medical History:  Diagnosis Date   Aortic stenosis    Atrial fibrillation (HCC)    Baker cyst    Left knee   DJD (degenerative joint disease)    knees   Dysrhythmia    Eczema    Erectile dysfunction    Gout    History of hiatal hernia    History of radiation therapy    Right lung- 04/21/21-05/03/21 Dr. Lynwood Nasuti   History of repair of thoracic aortic aneurysm 2006   HTN (hypertension)    Mild mitral regurgitation by prior echocardiogram    Moderate tricuspid regurgitation by prior echocardiogram    MVP (mitral valve prolapse)    Shortness of breath dyspnea    with activity    Medications:  Per anti-coag visit on12/2/25 Warfarin 3.75mg  on Fridays and 2.5mg  all other days  Assessment: 88 yo male presents with SOB and falls, pharmacy to dose warfarin for Afib. LD warfarin 3.75mg  today  Today, 08/09/2024  INR is 1.5 CBC from 12/12 stable   Goal of Therapy:  INR 2-2.5 per anti-coag clinic   Plan:  Warfarin 2.5 mg x 1 today  Daily INR Monitor for signs and symptoms of bleeding     Dolphus Roller, PharmD, BCPS 08/09/2024 12:38 PM       [1] No Known Allergies

## 2024-08-09 NOTE — Progress Notes (Signed)
 PROGRESS NOTE  [name] FMW:[id number] DOB: 1936/01/13 DOA: 08/08/2024 PCP: Theophilus Andrews, Tully GRADE, MD   LOS: 1 day   Brief narrative:  [name] is a 88 y.o. male with medical history significant of aortic stenosis status post TAVR on warfarin, chronic atrial fibrillation, history of repair of thoracic aortic aneurysm, essential hypertension, history of mitral valve prolapse, presented to the ER with shortness of breath, fall and dizziness.  Patient had a fall at home, normally uses a walker but has been having dizziness shortness of breath and weakness.  In the ED initial vitals were stable.  Labs were notable for mild leukocytosis with WBC of 12.1, hemoglobin of 12.5.  BMP with elevated AST at 93 and ALT at 60.  Urinalysis negative for infection.  proBNP was elevated at 31[address].  INR of 1.3.  Chest x-ray with bibasilar atelectasis.  CT cervical spine with no traumatic abnormality.  CT head with no acute intracranial abnormality.  CT chest with no acute rib fracture moderate to large bilateral pleural effusion with associated atelectasis and emphysema.  Patient was then considered for admission to the hospital for acute exacerbation of CHF.   Assessment/Plan: Principal Problem:   CHF exacerbation (HCC) Active Problems:   Essential hypertension   Aortic valve disorder   Chronic atrial fibrillation (HCC)   Anxiety   S/P AVR (aortic valve replacement)   Chronic diastolic CHF (congestive heart failure) (HCC)   Non-small cell lung cancer (HCC)   Centrilobular emphysema (HCC)  Acute on chronic diastolic CHF:  Does have significant bilateral pleural effusion.  Check 2D echocardiogram.  Continue diuresis.  Strict intake and output charting, daily weights.  Feels a little better with breathing today.  Not on supplemental oxygen.   multiple falls at home: Likely due to gait abnormalities and generalized weakness.  Will get PT OT evaluation.    history of AVR: On chronic warfarin.   We will continue warfarin with INR checks.  Dosing per pharmacy.   History of non-small cell lung cancer: Appears to be in remission.  Continue monitoring.  Currently with bilateral pleural effusion.   essential hypertension: Continue amlodipine    History of COPD: Now with shortness of breath.  No obvious wheezing.  Add albuterol .  Inhaler  DVT prophylaxis: Coumadin    Disposition: Home with home health.  Will get PT OT evaluation  Status is: Inpatient Remains inpatient appropriate because: Pending clinical improvement    Code Status:     Code Status: Full Code  Family Communication: None at bedside  Consultants: None  Procedures: None  Anti-infectives:  None  Anti-infectives (From admission, onward)    None        Subjective: Today, patient was seen and examined at bedside.  Seen in the ED.  Feels a little better with breathing.  States that he has been urinating quite a bit.  Denies any chest pain, nausea vomiting fever chills or rigor.  Objective: Vitals:   08/09/24 0800 08/09/24 0907  BP: 131/76 (!) 175/85  Pulse:  (!) 106  Resp: (!) 24 (!) 25  Temp:  (!) 97.5 F (36.4 C)  SpO2:  95%   No intake or output data in the 24 hours ending 08/09/24 0944 There were no vitals filed for this visit. There is no height or weight on file to calculate BMI.   Physical Exam: GENERAL: Patient is alert awake and oriented. Not in obvious distress.  Elderly male, Communicative, on room air appears chronically ill and weak.  HENT: No scleral pallor or icterus. Pupils equally reactive to light. Oral mucosa is moist NECK: is supple, no gross swelling noted. CHEST: Diminished breath sounds bilaterally. CVS: S1 and S2 heard, no murmur. Regular rate and rhythm.  ABDOMEN: Soft, non-tender, bowel sounds are present. EXTREMITIES: Bilateral lower extremity pitting edema CNS: Cranial nerves are intact. No focal motor deficits. SKIN: warm and dry without rashes.  Bruises over the  lower extremities  Data Review: I have personally reviewed the following laboratory data and studies,  CBC: Recent Labs  Lab 08/08/24 1316  WBC 12.1*  HGB 12.5*  HCT 39.3  MCV 96.3  PLT 197   Basic Metabolic Panel: Recent Labs  Lab 08/08/24 1316 08/09/24 0527  NA 140 144  K 3.8 3.6  CL 101 106  CO2 25 28  GLUCOSE 102* 91  BUN 22 21  CREATININE 1.00 0.99  CALCIUM 9.2 8.8*   Liver Function Tests: Recent Labs  Lab 08/08/24 1316  AST 93*  ALT 60*  ALKPHOS 100  BILITOT 1.0  PROT 6.8  ALBUMIN  3.8   No results for input(s): LIPASE, AMYLASE in the last 168 hours. No results for input(s): AMMONIA in the last 168 hours. Cardiac Enzymes: No results for input(s): CKTOTAL, CKMB, CKMBINDEX, TROPONINI in the last 168 hours. BNP (last 3 results) No results for input(s): BNP in the last 8760 hours.  ProBNP (last 3 results) Recent Labs    08/08/24 1316  PROBNP 31,199.0*    CBG: No results for input(s): GLUCAP in the last 168 hours. No results found for this or any previous visit (from the past 240 hours).   Studies: CT Chest Wo Contrast Result Date: 08/08/2024 EXAM: CT CHEST WITHOUT CONTRAST 08/08/2024 03:29:53 PM TECHNIQUE: CT of the chest was performed without the administration of intravenous contrast. Multiplanar reformatted images are provided for review. Automated exposure control, iterative reconstruction, and/or weight based adjustment of the mA/kV was utilized to reduce the radiation dose to as low as reasonably achievable. COMPARISON: 06/02/2024 CLINICAL HISTORY: Evaluation for rib fractures right side as well as infiltrate. FINDINGS: MEDIASTINUM: Thoracic aortic, coronary artery, and branch vessel atheromatous vascular calcifications. Substantial cardiomegaly. Aortic valve prosthesis. Mitral valve calcifications. Prominent main pulmonary artery suggests pulmonary arterial hypertension. The central airways are clear. LYMPH NODES: Scattered small  mediastinal lymph nodes. Right hilar node 1.4 cm in short axis on image 73 series 504, previously the same. No axillary lymphadenopathy. LUNGS AND PLEURA: Moderate to large bilateral pleural effusions with associated passive atelectasis, increased in volume from prior. Stable bandlike right apical scarring anteriorly with marginal fiducials. Emphysema. Airway thickening suggest bronchitis or reactive airways disease. Mild atelectasis posteriorly in the left upper lobe along the major fissure. Left upper lobe subsolid nodule noted on the prior exam is not readily visible on todays exam although is in a region of motion artifact. No pneumothorax. SOFT TISSUES/BONES: Severe degenerative shoulder arthropathy noted. No rib fracture identified. Thoracic spondylosis with multilevel bridging osteophytes. Motion artifact is present, reducing diagnostic sensitivity and specificity. UPPER ABDOMEN: Limited images of the upper abdomen demonstrates abdominal aortic atherosclerosis. IMPRESSION: 1. No acute rib fracture identified. 2. Moderate to large bilateral pleural effusions with associated passive atelectasis, increased from prior. 3. Emphysema with airway thickening consistent with bronchitis or reactive airways disease. 4. Substantial cardiomegaly with aortic valve prosthesis, mitral valve calcifications, and prominent main pulmonary artery suggesting pulmonary arterial hypertension. 5. Mild left upper lobe atelectasis along the major fissure. 6. Severe degenerative shoulder arthropathy and thoracic spondylosis with  multilevel bridging osteophytes. 7. Stable irregular bandlike opacity at the right lung apex with adjacent fiducials. Electronically signed by: Ryan Salvage MD 08/08/2024 04:01 PM EST RP Workstation: [id number]   DG Chest Portable 1 View Result Date: 08/08/2024 CLINICAL DATA:  Shortness of breath EXAM: PORTABLE CHEST 1 VIEW COMPARISON:  April 20, 2024 FINDINGS: Stable cardiomegaly. Status post aortic  valve repair. Minimal bibasilar subsegmental atelectasis. Degenerative changes are seen involving both shoulder joints. IMPRESSION: Minimal bibasilar subsegmental atelectasis. Electronically Signed   By: Lynwood Landy Raddle M.D.   On: 08/08/2024 15:52   CT Cervical Spine Wo Contrast Result Date: 08/08/2024 EXAM: CT CERVICAL SPINE WITHOUT CONTRAST 08/08/2024 03:29:53 PM TECHNIQUE: CT of the cervical spine was performed without the administration of intravenous contrast. Multiplanar reformatted images are provided for review. Automated exposure control, iterative reconstruction, and/or weight based adjustment of the mA/kV was utilized to reduce the radiation dose to as low as reasonably achievable. COMPARISON: 518 2025. CLINICAL HISTORY: Polytrauma, blunt. FINDINGS: CERVICAL SPINE: BONES AND ALIGNMENT: Similar alignment of the cervical spine. Trace degenerative retrolisthesis of C3 on C4 as well as trace degenerative anterolisthesis of C4 on C5 and C7 on T1 are unchanged. No evidence of traumatic malalignment. Similar alignment at the atlantoaxial articulations. Findings suggestive of mild basilar infarct. Degenerative soft tissue mineralization along the dorsal aspect of the dens slightly indenting the ventral thecal sac. DEGENERATIVE CHANGES: Severe disc space narrowing at C3-C4, C4-C5, C5-C6, and C6-C7. Disc osteophyte complexes at multiple levels. 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 Mild spinal canal stenosis at C3-C4, C5-C6, and C6-C7. There is no evidence of high grade osseous spinal canal stenosis. Arthrosis and uncovertebral hypertrophy with foraminal stenosis at multiple levels throughout the cervical spine, overall most pronounced at C3-C4 and C6-C7. SOFT TISSUES: No prevertebral soft tissue swelling. LUNGS AND PLEURA: Partially visualized bilateral pleural effusions. Scarring in the right lung apex. Emphysema. IMPRESSION: 1. No acute  traumatic abnormality of the cervical spine. 2. Severe multilevel degenerative changes as above. 3. Partially visualized bilateral pleural effusions. Electronically signed by: Donnice Mania MD 08/08/2024 03:44 PM EST RP Workstation: [id number]   CT Head Wo Contrast Result Date: 08/08/2024 EXAM: CT HEAD WITHOUT 08/08/2024 03:29:53 PM TECHNIQUE: CT of the head was performed without the administration of intravenous contrast. Automated exposure control, iterative reconstruction, and/or weight based adjustment of the mA/kV was utilized to reduce the radiation dose to as low as reasonably achievable. COMPARISON: 04/19/2024 CLINICAL HISTORY: Polytrauma, blunt FINDINGS: BRAIN AND VENTRICLES: No acute intracranial hemorrhage. No mass effect or midline shift. No extra-axial fluid collection. Similar appearance of mild parenchymal volume loss. Similar moderate chronic microvascular ischemic changes. 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 Atherosclerosis of the carotid siphons and intracranial vertebral arteries similar to prior. No hydrocephalus. ORBITS: Bilateral lens replacement similar to prior. SINUSES AND MASTOIDS: No acute abnormality. SOFT TISSUES AND SKULL: No acute skull fracture. Similar appearance of calcified atherosclerosis of multiple scalp vessels. IMPRESSION: 1. No acute intracranial abnormality. 2. Mild parenchymal volume loss and moderate chronic microvascular ischemic changes, unchanged from prior. 3. 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 Electronically signed by: Donnice Mania MD 08/08/2024 03:37 PM EST RP Workstation: [id number]      Vernal Alstrom, MD  [facility] 08/09/2024  If 7PM-7AM, please contact night-coverage

## 2024-08-10 ENCOUNTER — Encounter (HOSPITAL_COMMUNITY): Payer: Self-pay | Admitting: Internal Medicine

## 2024-08-10 ENCOUNTER — Other Ambulatory Visit: Payer: Self-pay

## 2024-08-10 LAB — BASIC METABOLIC PANEL WITH GFR
Anion gap: 8 (ref 5–15)
BUN: 23 mg/dL (ref 8–23)
CO2: 30 mmol/L (ref 22–32)
Calcium: 8.4 mg/dL — ABNORMAL LOW (ref 8.9–10.3)
Chloride: 102 mmol/L (ref 98–111)
Creatinine, Ser: 0.98 mg/dL (ref 0.61–1.24)
GFR, Estimated: >60 mL/min (ref >60)
Glucose, Bld: 97 mg/dL (ref 70–99)
Potassium: 3.1 mmol/L — ABNORMAL LOW (ref 3.5–5.1)
Sodium: 140 mmol/L (ref 135–145)

## 2024-08-10 LAB — CBC
HCT: 32.8 % — ABNORMAL LOW (ref 39.0–52.0)
Hemoglobin: 10.7 g/dL — ABNORMAL LOW (ref 13.0–17.0)
MCH: 30.9 pg (ref 26.0–34.0)
MCHC: 32.6 g/dL (ref 30.0–36.0)
MCV: 94.8 fL (ref 80.0–100.0)
Platelets: 186 K/uL (ref 150–400)
RBC: 3.46 MIL/uL — ABNORMAL LOW (ref 4.22–5.81)
RDW: 15.2 % (ref 11.5–15.5)
WBC: 8.5 K/uL (ref 4.0–10.5)
nRBC: 0 % (ref 0.0–0.2)

## 2024-08-10 LAB — PROTIME-INR
INR: 2.1 — ABNORMAL HIGH (ref 0.8–1.2)
Prothrombin Time: 24.5 s — ABNORMAL HIGH (ref 11.4–15.2)

## 2024-08-10 LAB — MAGNESIUM: Magnesium: 2.1 mg/dL (ref 1.7–2.4)

## 2024-08-10 MED ORDER — PANTOPRAZOLE SODIUM 40 MG PO TBEC
40.0000 mg | DELAYED_RELEASE_TABLET | Freq: Every day | ORAL | Status: DC
  Administered 2024-08-10 – 2024-08-14 (×5): 40 mg via ORAL
  Filled 2024-08-10 (×6): qty 1

## 2024-08-10 MED ORDER — POTASSIUM CHLORIDE CRYS ER 20 MEQ PO TBCR
40.0000 meq | EXTENDED_RELEASE_TABLET | Freq: Once | ORAL | Status: AC
  Administered 2024-08-10: 40 meq via ORAL
  Filled 2024-08-10: qty 2

## 2024-08-10 MED ORDER — WARFARIN SODIUM 1 MG PO TABS
1.0000 mg | ORAL_TABLET | Freq: Once | ORAL | Status: AC
  Administered 2024-08-10: 1 mg via ORAL
  Filled 2024-08-10: qty 1

## 2024-08-10 MED ORDER — GUAIFENESIN ER 600 MG PO TB12
600.0000 mg | ORAL_TABLET | Freq: Two times a day (BID) | ORAL | Status: DC
  Administered 2024-08-10 – 2024-08-14 (×9): 600 mg via ORAL
  Filled 2024-08-10 (×10): qty 1

## 2024-08-10 MED ADMIN — Potassium Chloride Microencapsulated Crys ER Tab 20 mEq: 20 meq | ORAL | NDC 00245531989

## 2024-08-10 NOTE — Progress Notes (Signed)
 PHARMACY - ANTICOAGULATION CONSULT NOTE  Pharmacy Consult for warfarin Indication: atrial fibrillation  Allergies[1]  Patient Measurements: Height: 5' 6 (167.6 cm) Weight: 70 kg (154 lb 5.2 oz) IBW/kg (Calculated) : 63.8  Vital Signs: Temp: 97.4 F (36.3 C) (12/14 0425) Temp Source: Oral (12/14 0425) BP: 113/67 (12/14 0425) Pulse Rate: 69 (12/14 0425)  Labs: Recent Labs    08/08/24 1316 08/08/24 1704 08/09/24 0527 08/10/24 0552  HGB 12.5*  --   --  10.7*  HCT 39.3  --   --  32.8*  PLT 197  --   --  186  APTT  --  32  --   --   LABPROT  --  16.4* 18.5* 24.5*  INR  --  1.3* 1.5* 2.1*  CREATININE 1.00  --  0.99 0.98    Estimated Creatinine Clearance: 48.8 mL/min (by C-G formula based on SCr of 0.98 mg/dL).   Medical History: Past Medical History:  Diagnosis Date   Aortic stenosis    Atrial fibrillation (HCC)    Baker cyst    Left knee   DJD (degenerative joint disease)    knees   Dysrhythmia    Eczema    Erectile dysfunction    Gout    History of hiatal hernia    History of radiation therapy    Right lung- 04/21/21-05/03/21 Dr. Lynwood Nasuti   History of repair of thoracic aortic aneurysm 2006   HTN (hypertension)    Mild mitral regurgitation by prior echocardiogram    Moderate tricuspid regurgitation by prior echocardiogram    MVP (mitral valve prolapse)    Shortness of breath dyspnea    with activity    Medications:  Per anti-coag visit on12/2/25 Warfarin 3.75mg  on Fridays and 2.5mg  all other days  Assessment: 88 yo male presents with SOB and falls, pharmacy to dose warfarin for Afib. LD warfarin 3.75mg  today  Today, 08/10/2024  INR is 12.1, up from 1.5 CBC from 12/12 stable   Goal of Therapy:  INR 2-2.5 per anti-coag clinic   Plan:  Warfarin 1 mg x 1 today  Daily INR Monitor for signs and symptoms of bleeding     Pinchos Topel, PharmD, BCPS 08/10/2024 9:55 AM        [1] No Known Allergies

## 2024-08-10 NOTE — Evaluation (Addendum)
 Physical Therapy Evaluation Patient Details Name: [name] MRN: [id number] DOB: 11/21/1935 Today's Date: 08/10/2024  History of Present Illness  88 year old presents complaining of shortness of breath, fall and dizziness.  CT chest with no acute rib fractures, moderate to large bilateral pleural effusion with associated atelectasis and emphysema.   admitted for heart failure exacerbation.  past medical history significant for aortic stenosis status post TAVR on warfarin, chronic A-fib, history of thoracic aortic aneurysmal repair, hypertension, mitral valve prolapse,  Clinical Impression  Pt is independent/mod I at baseline per his report. Dtr is present for end of session, supportive. Pt is dizzy, orthostatic as noted below during PT eval. He required min assist for supine>sit and STS transitions.   Pt states he does not want any further PT bc it is a waste of time and $ and that they charge you $260 every time they see you. He states I am old man and there is no point Attempted to explain role of PT in acute setting, dtr states she has had this conversation with pt as well however he is not swayed from his point of view.   Pt is agreeable to this session and education regarding orthostatic VS. Reassured pt we will not continue to follow in acute setting however will have mobility team check on pt if he is agreeable to that. Pt does not want f/u PT (dtr may be agreeable if needed at d/c)    08/10/24 1444  Orthostatic Lying   BP- Lying 141/68  Pulse- Lying 78  Orthostatic Sitting  BP- Sitting (!) 122/91 (map 101)  Pulse- Sitting 74  Orthostatic Standing at 0 minutes  BP- Standing at 0 minutes 98/49 (map 63)  Pulse- Standing at 0 minutes 77      If plan is discharge home, recommend the following: A little help with walking and/or transfers;A little help with bathing/dressing/bathroom;Assist for transportation;Help with stairs or ramp for entrance;Assistance with cooking/housework    Can travel by private vehicle        Equipment Recommendations None recommended by PT  Recommendations for Other Services       Functional Status Assessment       Precautions / Restrictions Precautions Precautions: Fall Precaution/Restrictions Comments: orthostatic/monitor Restrictions Weight Bearing Restrictions Per Provider Order: No      Mobility  Bed Mobility Overal bed mobility: Needs Assistance Bed Mobility: Supine to Sit, Sit to Supine     Supine to sit: Min assist Sit to supine: Contact guard assist   General bed mobility comments: assist to elevate trunk, with incr time pt able to return to supine with CGA to guide LEs fully on to bed. bed flat, no use of rails    Transfers Overall transfer level: Needs assistance Equipment used: Rolling walker (2 wheels) Transfers: Sit to/from Stand Sit to Stand: Min assist           General transfer comment: STS x2, cues to power up with LEs, assist to rise and steady. pt likes to pull up on RW or someone's hand    Ambulation/Gait               General Gait Details: declined, getting back to bed with assist of RN  on PT arrival  Stairs            Wheelchair Mobility     Tilt Bed    Modified Rankin (Stroke Patients Only)       Balance Overall balance assessment: Needs assistance, History of  Falls Sitting-balance support: Feet supported, No upper extremity supported Sitting balance-Leahy Scale: Fair Sitting balance - Comments: no challenged d/t monitoring BPs   Standing balance support: Reliant on assistive device for balance, Bilateral upper extremity supported Standing balance-Leahy Scale: Poor Standing balance comment: reliant on device                             Pertinent Vitals/Pain Pain Assessment Pain Assessment: No/denies pain (genreally not feeling well)    Home Living Family/patient expects to be discharged to:: Private residence Living Arrangements:  Alone Available Help at Discharge: Family;Available PRN/intermittently (dtr supportive) Type of Home: House Home Access: Stairs to enter       Home Layout: Two level;Able to live on main level with bedroom/bathroom Home Equipment: Shower seat - built in;Rollator (4 wheels);BSC/3in1;Wheelchair - manual;Crutches      Prior Function               Mobility Comments: Independent with ambulation. ADLs Comments: Independent with ADLs, cooking, cleaning and driving.     Extremity/Trunk Assessment   Upper Extremity Assessment Upper Extremity Assessment: Defer to OT evaluation    Lower Extremity Assessment Lower Extremity Assessment: Generalized weakness       Communication   Communication Communication: No apparent difficulties    Cognition Arousal: Alert Behavior During Therapy: WFL for tasks assessed/performed   PT - Cognitive impairments: No apparent impairments                         Following commands: Intact       Cueing Cueing Techniques: Verbal cues     General Comments      Exercises     Assessment/Plan    PT Assessment Patient needs continued PT services (pt declines any further PT after evaluation)  PT Problem List         PT Treatment Interventions      PT Goals (Current goals can be found in the Care Plan section)  Acute Rehab PT Goals PT Goal Formulation: All assessment and education complete, DC therapy (pt declines any additional PT sessions)    Frequency       Co-evaluation               AM-PAC PT 6 Clicks Mobility  Outcome Measure Help needed turning from your back to your side while in a flat bed without using bedrails?: A Little Help needed moving from lying on your back to sitting on the side of a flat bed without using bedrails?: A Little Help needed moving to and from a bed to a chair (including a wheelchair)?: A Little Help needed standing up from a chair using your arms (e.g., wheelchair or bedside  chair)?: A Little Help needed to walk in hospital room?: A Lot Help needed climbing 3-5 steps with a railing? : Total 6 Click Score: 15    End of Session   Activity Tolerance: Treatment limited secondary to medical complications (Comment) (dizzy/orthostatic) Patient left: in bed;with call bell/phone within reach;with family/visitor present;with bed alarm set   PT Visit Diagnosis: Other abnormalities of gait and mobility (R26.89)    Time: 8569-8542 PT Time Calculation (min) (ACUTE ONLY): 27 min   Charges:   PT Evaluation $PT Eval Low Complexity: 1 Low PT Treatments $Therapeutic Activity: 8-22 mins PT General Charges $$ ACUTE PT VISIT: 1 Visit         Rexene, PT  Acute Rehab Dept [facility]) [phone]  08/10/2024   [facility] 08/10/2024, 3:25 PM

## 2024-08-10 NOTE — Progress Notes (Signed)
 PROGRESS NOTE    [name]  FMW:[id number] DOB: [birth year] DOA: 08/08/2024 PCP: Theophilus Andrews, Tully GRADE, MD   Brief Narrative: 88 year old with past medical history significant for aortic stenosis status post TAVR on warfarin, chronic A-fib, history of thoracic aortic aneurysmal repair, hypertension, mitral valve prolapse, presents complaining of shortness of breath, fall and dizziness.  Patient had a fall at home, normally uses a walker but has been having dizziness and shortness of breath and weakness.  In the ED he was noted to have leukocytosis, white count 12.1, hemoglobin 12.5, elevated AST 93, ALT 68.  UA negative for infection.  proBNP elevated.  INR 1.3.  Chest x-ray basilar atelectasis.  CT cervical spine no traumatic abnormality.  CT head no acute intracranial abnormality.  CT chest with no acute rib fractures, moderate to large bilateral pleural effusion with associated atelectasis and emphysema.  Patient was admitted for heart failure exacerbation.   Assessment & Plan:   Principal Problem:   CHF exacerbation (HCC) Active Problems:   Essential hypertension   Aortic valve disorder   Chronic atrial fibrillation (HCC)   Anxiety   S/P AVR (aortic valve replacement)   Chronic diastolic CHF (congestive heart failure) (HCC)   Non-small cell lung cancer (HCC)   Centrilobular emphysema (HCC)   1-Acute on chronic diastolic heart failure - Patient presented with worsening shortness of breath, bilateral pleural effusion.  Elevated BNP 31,007 - Continue with IV Lasix  today. - Strict I's and O - Weight today 154 pounds Plan to repeat chest x-ray tomorrow.  Multiple falls at home - Multiple falls at home and generalized weakness.  Also reports some dizziness. Will check orthostatic vital.  PT OT eval  History of AVR - On chronic warfarin.  Continue per pharmacy today  History of non-small cell lung cancer In remission.  Essential hypertension Will hold amlodipine   systolic blood pressures are Monitor blood pressure while on diuretic  History of COPD Continue as needed albuterol   DVT prophylaxis: Coumadin   Hypokalemia: Replete orally      Estimated body mass index is 24.91 kg/m as calculated from the following:   Height as of this encounter: 5' 6 (1.676 m).   Weight as of this encounter: 70 kg.   DVT prophylaxis: Coumadin  Code Status: Full code Family Communication: Care discussed with patient Disposition Plan:  Status is: Inpatient Remains inpatient appropriate because: Management failure exacerbation    Consultants:  none  Procedures:  none Antimicrobials:    Subjective: Patient reports he is breathing better and feeling better overall.  He reports some mild dizziness on exertion, been going on for some time, feels maybe started to get better again  Objective: Vitals:   08/09/24 1712 08/09/24 2111 08/10/24 0137 08/10/24 0425  BP: 106/61 113/69 135/72 113/67  Pulse: 91 73 80 69  Resp: 16 16 20 16   Temp: 98.1 F (36.7 C) (!) 97.5 F (36.4 C) (!) 97.5 F (36.4 C) (!) 97.4 F (36.3 C)  TempSrc: Oral Oral Oral Oral  SpO2: 99% 97% 95% 95%  Weight:    70 kg  Height:        Intake/Output Summary (Last 24 hours) at 08/10/2024 0748 Last data filed at 08/09/2024 2112 Gross per 24 hour  Intake 720 ml  Output 650 ml  Net 70 ml   Filed Weights   08/10/24 0425  Weight: 70 kg    Examination:  General exam: Appears calm and comfortable  Respiratory system: Bilateral crackles Cardiovascular system: S1 & S2  heard, RRR. No JVD, murmurs, rubs, gallops or clicks. No pedal edema. Gastrointestinal system: Abdomen is nondistended, soft and nontender. No organomegaly or masses felt. Normal bowel sounds heard. Central nervous system: Alert and oriented. No focal neurological deficits. Extremities: Symmetric 5 x 5 power.    Data Reviewed: I have personally reviewed following labs and imaging studies  CBC: Recent Labs   Lab 08/08/24 1316 08/10/24 0552  WBC 12.1* 8.5  HGB 12.5* 10.7*  HCT 39.3 32.8*  MCV 96.3 94.8  PLT 197 186   Basic Metabolic Panel: Recent Labs  Lab 08/08/24 1316 08/09/24 0527 08/10/24 0552  NA 140 144 140  K 3.8 3.6 3.1*  CL 101 106 102  CO2 25 28 30   GLUCOSE 102* 91 97  BUN 22 21 23   CREATININE 1.00 0.99 0.98  CALCIUM 9.2 8.8* 8.4*  MG  --   --  2.1   GFR: Estimated Creatinine Clearance: 48.8 mL/min (by C-G formula based on SCr of 0.98 mg/dL). Liver Function Tests: Recent Labs  Lab 08/08/24 1316  AST 93*  ALT 60*  ALKPHOS 100  BILITOT 1.0  PROT 6.8  ALBUMIN  3.8   No results for input(s): LIPASE, AMYLASE in the last 168 hours. No results for input(s): AMMONIA in the last 168 hours. Coagulation Profile: Recent Labs  Lab 08/08/24 1704 08/09/24 0527 08/10/24 0552  INR 1.3* 1.5* 2.1*   Cardiac Enzymes: No results for input(s): CKTOTAL, CKMB, CKMBINDEX, TROPONINI in the last 168 hours. BNP (last 3 results) Recent Labs    08/08/24 1316  PROBNP 31,199.0*   HbA1C: No results for input(s): HGBA1C in the last 72 hours. CBG: No results for input(s): GLUCAP in the last 168 hours. Lipid Profile: No results for input(s): CHOL, HDL, LDLCALC, TRIG, CHOLHDL, LDLDIRECT in the last 72 hours. Thyroid  Function Tests: No results for input(s): TSH, T4TOTAL, FREET4, T3FREE, THYROIDAB in the last 72 hours. Anemia Panel: No results for input(s): VITAMINB12, FOLATE, FERRITIN, TIBC, IRON, RETICCTPCT in the last 72 hours. Sepsis Labs: No results for input(s): PROCALCITON, LATICACIDVEN in the last 168 hours.  No results found for this or any previous visit (from the past 240 hours).       Radiology Studies: CT Chest Wo Contrast Result Date: 08/08/2024 EXAM: CT CHEST WITHOUT CONTRAST 08/08/2024 03:29:53 PM TECHNIQUE: CT of the chest was performed without the administration of intravenous contrast. Multiplanar  reformatted images are provided for review. Automated exposure control, iterative reconstruction, and/or weight based adjustment of the mA/kV was utilized to reduce the radiation dose to as low as reasonably achievable. COMPARISON: 06/02/2024 CLINICAL HISTORY: Evaluation for rib fractures right side as well as infiltrate. FINDINGS: MEDIASTINUM: Thoracic aortic, coronary artery, and branch vessel atheromatous vascular calcifications. Substantial cardiomegaly. Aortic valve prosthesis. Mitral valve calcifications. Prominent main pulmonary artery suggests pulmonary arterial hypertension. The central airways are clear. LYMPH NODES: Scattered small mediastinal lymph nodes. Right hilar node 1.4 cm in short axis on image 73 series 504, previously the same. No axillary lymphadenopathy. LUNGS AND PLEURA: Moderate to large bilateral pleural effusions with associated passive atelectasis, increased in volume from prior. Stable bandlike right apical scarring anteriorly with marginal fiducials. Emphysema. Airway thickening suggest bronchitis or reactive airways disease. Mild atelectasis posteriorly in the left upper lobe along the major fissure. Left upper lobe subsolid nodule noted on the prior exam is not readily visible on todays exam although is in a region of motion artifact. No pneumothorax. SOFT TISSUES/BONES: Severe degenerative shoulder arthropathy noted. No rib fracture identified.  Thoracic spondylosis with multilevel bridging osteophytes. Motion artifact is present, reducing diagnostic sensitivity and specificity. UPPER ABDOMEN: Limited images of the upper abdomen demonstrates abdominal aortic atherosclerosis. IMPRESSION: 1. No acute rib fracture identified. 2. Moderate to large bilateral pleural effusions with associated passive atelectasis, increased from prior. 3. Emphysema with airway thickening consistent with bronchitis or reactive airways disease. 4. Substantial cardiomegaly with aortic valve prosthesis, mitral  valve calcifications, and prominent main pulmonary artery suggesting pulmonary arterial hypertension. 5. Mild left upper lobe atelectasis along the major fissure. 6. Severe degenerative shoulder arthropathy and thoracic spondylosis with multilevel bridging osteophytes. 7. Stable irregular bandlike opacity at the right lung apex with adjacent fiducials. Electronically signed by: Ryan Salvage MD 08/08/2024 04:01 PM EST RP Workstation: [id number]   DG Chest Portable 1 View Result Date: 08/08/2024 CLINICAL DATA:  Shortness of breath EXAM: PORTABLE CHEST 1 VIEW COMPARISON:  April 20, 2024 FINDINGS: Stable cardiomegaly. Status post aortic valve repair. Minimal bibasilar subsegmental atelectasis. Degenerative changes are seen involving both shoulder joints. IMPRESSION: Minimal bibasilar subsegmental atelectasis. Electronically Signed   By: Lynwood Landy Raddle M.D.   On: 08/08/2024 15:52   CT Cervical Spine Wo Contrast Result Date: 08/08/2024 EXAM: CT CERVICAL SPINE WITHOUT CONTRAST 08/08/2024 03:29:53 PM TECHNIQUE: CT of the cervical spine was performed without the administration of intravenous contrast. Multiplanar reformatted images are provided for review. Automated exposure control, iterative reconstruction, and/or weight based adjustment of the mA/kV was utilized to reduce the radiation dose to as low as reasonably achievable. COMPARISON: 518 2025. CLINICAL HISTORY: Polytrauma, blunt. FINDINGS: CERVICAL SPINE: BONES AND ALIGNMENT: Similar alignment of the cervical spine. Trace degenerative retrolisthesis of C3 on C4 as well as trace degenerative anterolisthesis of C4 on C5 and C7 on T1 are unchanged. No evidence of traumatic malalignment. Similar alignment at the atlantoaxial articulations. Findings suggestive of mild basilar infarct. Degenerative soft tissue mineralization along the dorsal aspect of the dens slightly indenting the ventral thecal sac. DEGENERATIVE CHANGES: Severe disc space narrowing at  C3-C4, C4-C5, C5-C6, and C6-C7. Disc osteophyte complexes at multiple levels. 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 Mild spinal canal stenosis at C3-C4, C5-C6, and C6-C7. There is no evidence of high grade osseous spinal canal stenosis. Arthrosis and uncovertebral hypertrophy with foraminal stenosis at multiple levels throughout the cervical spine, overall most pronounced at C3-C4 and C6-C7. SOFT TISSUES: No prevertebral soft tissue swelling. LUNGS AND PLEURA: Partially visualized bilateral pleural effusions. Scarring in the right lung apex. Emphysema. IMPRESSION: 1. No acute traumatic abnormality of the cervical spine. 2. Severe multilevel degenerative changes as above. 3. Partially visualized bilateral pleural effusions. Electronically signed by: Donnice Mania MD 08/08/2024 03:44 PM EST RP Workstation: [id number]   CT Head Wo Contrast Result Date: 08/08/2024 EXAM: CT HEAD WITHOUT 08/08/2024 03:29:53 PM TECHNIQUE: CT of the head was performed without the administration of intravenous contrast. Automated exposure control, iterative reconstruction, and/or weight based adjustment of the mA/kV was utilized to reduce the radiation dose to as low as reasonably achievable. COMPARISON: 04/19/2024 CLINICAL HISTORY: Polytrauma, blunt FINDINGS: BRAIN AND VENTRICLES: No acute intracranial hemorrhage. No mass effect or midline shift. No extra-axial fluid collection. Similar appearance of mild parenchymal volume loss. Similar moderate chronic microvascular ischemic changes. 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 Atherosclerosis of the carotid siphons and intracranial vertebral arteries similar to prior. No hydrocephalus. ORBITS: Bilateral lens replacement similar to prior. SINUSES AND MASTOIDS: No acute abnormality. SOFT TISSUES AND  SKULL: No acute skull fracture. Similar appearance of calcified atherosclerosis of multiple  scalp vessels. IMPRESSION: 1. No acute intracranial abnormality. 2. Mild parenchymal volume loss and moderate chronic microvascular ischemic changes, unchanged from prior. 3. 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 Electronically signed by: Donnice Mania MD 08/08/2024 03:37 PM EST RP Workstation: [id number]        Scheduled Meds:  allopurinol   300 mg Oral Daily   amLODipine   5 mg Oral Daily   furosemide   40 mg Intravenous BID   potassium chloride  SA  20 mEq Oral Daily   predniSONE   20 mg Oral Q breakfast   sodium chloride  flush  3 mL Intravenous Q12H   tamsulosin   0.4 mg Oral QPC breakfast   traMADol   50 mg Oral Daily   traZODone   50-100 mg Oral QHS   Warfarin - Pharmacist Dosing Inpatient   Does not apply q1600   Continuous Infusions:   LOS: 2 days    Time spent: 35 Minutes.     Owen DELENA Lore, MD [facility]   If 7PM-7AM, please contact night-coverage [email or web address]  08/10/2024, 7:48 AM

## 2024-08-11 ENCOUNTER — Inpatient Hospital Stay (HOSPITAL_COMMUNITY)

## 2024-08-11 ENCOUNTER — Telehealth: Payer: Self-pay

## 2024-08-11 LAB — PROTIME-INR
INR: 2 — ABNORMAL HIGH (ref 0.8–1.2)
Prothrombin Time: 24.1 s — ABNORMAL HIGH (ref 11.4–15.2)

## 2024-08-11 LAB — BASIC METABOLIC PANEL WITH GFR
Anion gap: 8 (ref 5–15)
BUN: 22 mg/dL (ref 8–23)
CO2: 31 mmol/L (ref 22–32)
Calcium: 8.6 mg/dL — ABNORMAL LOW (ref 8.9–10.3)
Chloride: 101 mmol/L (ref 98–111)
Creatinine, Ser: 1 mg/dL (ref 0.61–1.24)
GFR, Estimated: >60 mL/min (ref >60)
Glucose, Bld: 110 mg/dL — ABNORMAL HIGH (ref 70–99)
Potassium: 3.3 mmol/L — ABNORMAL LOW (ref 3.5–5.1)
Sodium: 139 mmol/L (ref 135–145)

## 2024-08-11 LAB — VITAMIN B12: Vitamin B-12: 569 pg/mL (ref 180–914)

## 2024-08-11 MED ORDER — WARFARIN SODIUM 2.5 MG PO TABS
2.5000 mg | ORAL_TABLET | Freq: Once | ORAL | Status: AC
  Administered 2024-08-11: 18:00:00 2.5 mg via ORAL
  Filled 2024-08-11: qty 1

## 2024-08-11 MED ORDER — SCOPOLAMINE 1 MG/3DAYS TD PT72
1.0000 | MEDICATED_PATCH | TRANSDERMAL | Status: DC
  Administered 2024-08-11: 22:00:00 1 mg via TRANSDERMAL
  Filled 2024-08-11: qty 1

## 2024-08-11 MED ORDER — POTASSIUM CHLORIDE CRYS ER 20 MEQ PO TBCR
40.0000 meq | EXTENDED_RELEASE_TABLET | Freq: Once | ORAL | Status: AC
  Administered 2024-08-11: 18:00:00 40 meq via ORAL
  Filled 2024-08-11 (×2): qty 2

## 2024-08-11 MED ADMIN — Potassium Chloride Microencapsulated Crys ER Tab 20 mEq: 20 meq | ORAL | @ 11:00:00 | NDC 70010013501

## 2024-08-11 NOTE — TOC Initial Note (Addendum)
 Transition of Care [facility]) - Initial/Assessment Note    Patient Details  Name: [name] MRN: [id number] Date of Birth: September 11, 1935  [facility] CM/SW Contact:    Bascom Service, RN Phone Number: 08/11/2024, 10:12 AM  Clinical Narrative: Spoke to Sandra(dtr) about d/c plans-Has gone to [facility] in past-will await PT to see again.  -12:30p-  await PT note,then to await choice,then start auth.t               Expected Discharge Plan:  (TBD) Barriers to Discharge: Continued Medical Work up   Patient Goals and CMS Choice Patient states their goals for this hospitalization and ongoing recovery are:: TBD CMS Medicare.gov Compare Post Acute Care list provided to:: Patient Choice offered to / list presented to : Patient [place] ownership interest in [facility].provided to:: Adult Children    Expected Discharge Plan and Services   Discharge Planning Services: CM Consult Post Acute Care Choice:  (TBD) Living arrangements for the past 2 months: Single Family Home                                      Prior Living Arrangements/Services Living arrangements for the past 2 months: Single Family Home Lives with:: Self   Do you feel safe going back to the place where you live?: Yes          Current home services: DME (rw)    Activities of Daily Living   ADL Screening (condition at time of admission) Independently performs ADLs?: No Does the patient have a NEW difficulty with bathing/dressing/toileting/self-feeding that is expected to last >3 days?: Yes (Initiates electronic notice to provider for possible OT consult) Does the patient have a NEW difficulty with getting in/out of bed, walking, or climbing stairs that is expected to last >3 days?: Yes (Initiates electronic notice to provider for possible PT consult) Does the patient have a NEW difficulty with communication that is expected to last >3 days?: No Is the patient deaf or have  difficulty hearing?: No Does the patient have difficulty seeing, even when wearing glasses/contacts?: No Does the patient have difficulty concentrating, remembering, or making decisions?: No  Permission Sought/Granted Permission sought to share information with : Case Manager Permission granted to share information with : Yes, Verbal Permission Granted              Emotional Assessment              Admission diagnosis:  Shortness of breath [R06.02] Vertigo [R42] Pleural effusion [J90] CHF exacerbation (HCC) [I50.9] Patient Active Problem List   Diagnosis Date Noted   CHF exacerbation (HCC) 08/08/2024   Fall at home 04/19/2024   Permanent atrial fibrillation (HCC) 04/19/2024   Normocytic anemia 04/19/2024   Frequent PVCs 04/19/2024   Abrasion of both knees 04/19/2024   Hyperbilirubinemia 04/19/2024   Physical deconditioning 04/19/2024   Contusion of right knee 04/19/2024   Syncope 04/19/2024   Atherosclerosis of native arteries of extremity with intermittent claudication 02/16/2024   Melena 09/20/2023   Acute blood loss anemia 09/20/2023   Symptomatic anemia 09/20/2023   GI bleed 09/19/2023   Hypersensitivity pneumonitis (HCC) 06/04/2023   Centrilobular emphysema (HCC) 06/04/2023   Elevated PSA 10/30/2022   Non-small cell lung cancer (HCC) 06/23/2022   Hemoptysis 03/07/2021   Pulmonary nodules 03/07/2021   Hypokalemia 09/24/2019   Vitamin D  deficiency 09/24/2019  Vitamin B12 deficiency 09/24/2019   Chronic diastolic CHF (congestive heart failure) (HCC) 09/17/2018   Nontraumatic epidural hematoma (HCC) 05/01/2017   Routine general medical examination at a health care facility 08/17/2015   Dyspnea 06/10/2015   History of total knee arthroplasty 04/14/2015   Spinal stenosis, lumbar region, with neurogenic claudication 01/15/2015   Encounter for therapeutic drug monitoring 10/13/2013   S/P AVR (aortic valve replacement) 07/03/2013   Urticaria 11/27/2011    Dysphonia 11/07/2011   Anxiety 04/04/2011   CAROTID BRUIT 03/23/2009   Osteoarthritis 03/22/2009   Chronic atrial fibrillation (HCC) 03/16/2009   Aortic valve disorder 01/21/2008   ERECTILE DYSFUNCTION, 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 [place] - [address] [place] [address] Phone: [phone] Fax: [phone]     Social Drivers of Health (SDOH) Social History: SDOH Screenings   Food Insecurity: No Food Insecurity (08/09/2024)  Housing: Low Risk (08/09/2024)  Transportation Needs: No Transportation Needs (08/09/2024)  Utilities: Not At Risk (08/09/2024)  Alcohol Screen: Low Risk (10/30/2023)  Depression (PHQ2-9): Low Risk (05/15/2024)  Financial Resource Strain: Low Risk (10/30/2023)  Physical Activity: Insufficiently Active (10/30/2023)  Social Connections: Moderately Isolated (08/09/2024)  Stress: No Stress Concern Present (10/30/2023)  Tobacco Use: Medium Risk (08/10/2024)  Health Literacy: Adequate Health Literacy (10/30/2023)   SDOH Interventions:     Readmission Risk Interventions     No data to display

## 2024-08-11 NOTE — Telephone Encounter (Signed)
 Pt LVM on Saturday, 12/13, reporting he has been hospitalized for a fall/weakness and will not make his apt for coumadin  clinic tomorrow.  Review of chart reveals pt was in ER on 12/12 after a fall/weakness and SOB. He was then hospitalized and is still in pt. Note reports CHF exacerbation.  Cancelled coumadin  clinic apt for tomorrow.  LVM for pt to return call.

## 2024-08-11 NOTE — Telephone Encounter (Signed)
 Pt returned call and reports he is not sure of d/c but he will be going to a rehab facility. He also reports he may be moving in with his daughter after he leaves rehab. Advised to update coumadin  clinic to progress. Pt verbalized understanding.

## 2024-08-11 NOTE — Progress Notes (Signed)
 PROGRESS NOTE    [name]  FMW:[id number] DOB: [birth year] DOA: 08/08/2024 PCP: Theophilus Andrews, Tully GRADE, MD   Brief Narrative: 88 year old with past medical history significant for aortic stenosis status post TAVR on warfarin, chronic A-fib, history of thoracic aortic aneurysmal repair, hypertension, mitral valve prolapse, presents complaining of shortness of breath, fall and dizziness.  Patient had a fall at home, normally uses a walker but has been having dizziness and shortness of breath and weakness.  In the ED he was noted to have leukocytosis, white count 12.1, hemoglobin 12.5, elevated AST 93, ALT 68.  UA negative for infection.  proBNP elevated.  INR 1.3.  Chest x-ray basilar atelectasis.  CT cervical spine no traumatic abnormality.  CT head no acute intracranial abnormality.  CT chest with no acute rib fractures, moderate to large bilateral pleural effusion with associated atelectasis and emphysema.  Patient was admitted for heart failure exacerbation.   Assessment & Plan:   Principal Problem:   CHF exacerbation (HCC) Active Problems:   Essential hypertension   Aortic valve disorder   Chronic atrial fibrillation (HCC)   Anxiety   S/P AVR (aortic valve replacement)   Chronic diastolic CHF (congestive heart failure) (HCC)   Non-small cell lung cancer (HCC)   Centrilobular emphysema (HCC)   1-Acute on chronic diastolic heart failure - Patient presented with worsening shortness of breath, bilateral pleural effusion.  Elevated BNP 31,007 - Continue with IV Lasix   - Strict I's and O - Weight : 154 ----152 -Chest x ray: Cardiomegaly with mild central vascular congestion and small bilateral pleural effusions. X ray looks better, pleural effusion smaller. Plan to transition to oral diuretics tomorrow.   Multiple falls at home - Multiple falls at home and generalized weakness.  Also reports some dizziness. PT OT eval Will benefit from rehab.   Orthostatic Hypotension.  Stop Norvasc .   History of AVR - On chronic warfarin.  Continue per pharmacy today  History of non-small cell lung cancer In remission.  Essential hypertension Will hold amlodipine   due to orthostatic hypotension.  Monitor blood pressure while on diuretic  History of COPD Continue as needed albuterol   DVT prophylaxis: Coumadin   Hypokalemia: Replete orally      Estimated body mass index is 24.59 kg/m as calculated from the following:   Height as of this encounter: 5' 6 (1.676 m).   Weight as of this encounter: 69.1 kg.   DVT prophylaxis: Coumadin  Code Status: Full code Family Communication: Care discussed with patient Disposition Plan:  Status is: Inpatient Remains inpatient appropriate because: Management failure exacerbation. SNF tomorrow.     Consultants:  none  Procedures:  none Antimicrobials:    Subjective: He is breathing better. He is agreeable to go to rehab,.  Cough improved.   Objective: Vitals:   08/10/24 1428 08/10/24 1437 08/10/24 2029 08/11/24 0517  BP: (!) 100/59 121/71 117/72 (!) 147/70  Pulse: 75 80 73 72  Resp: 19  16   Temp: 98.2 F (36.8 C) 97.6 F (36.4 C) 97.7 F (36.5 C) 97.7 F (36.5 C)  TempSrc: Oral Oral Oral Oral  SpO2: 94%  99% 97%  Weight:    69.1 kg  Height:        Intake/Output Summary (Last 24 hours) at 08/11/2024 1229 Last data filed at 08/11/2024 1000 Gross per 24 hour  Intake 920 ml  Output 1850 ml  Net -930 ml   Filed Weights   08/10/24 0425 08/11/24 0517  Weight: 70 kg 69.1 kg  Examination:  General exam: NAD Respiratory system: Normal resp effort, BL crackles.  Cardiovascular system: S 1, S 2 RRR Gastrointestinal system:BS present, soft nt Central nervous system: alert Extremities: no edema    Data Reviewed: I have personally reviewed following labs and imaging studies  CBC: Recent Labs  Lab 08/08/24 1316 08/10/24 0552  WBC 12.1* 8.5  HGB 12.5* 10.7*  HCT 39.3 32.8*  MCV 96.3  94.8  PLT 197 186   Basic Metabolic Panel: Recent Labs  Lab 08/08/24 1316 08/09/24 0527 08/10/24 0552 08/11/24 0420  NA 140 144 140 139  K 3.8 3.6 3.1* 3.3*  CL 101 106 102 101  CO2 25 28 30 31   GLUCOSE 102* 91 97 110*  BUN 22 21 23 22   CREATININE 1.00 0.99 0.98 1.00  CALCIUM 9.2 8.8* 8.4* 8.6*  MG  --   --  2.1  --    GFR: Estimated Creatinine Clearance: 47.9 mL/min (by C-G formula based on SCr of 1 mg/dL). Liver Function Tests: Recent Labs  Lab 08/08/24 1316  AST 93*  ALT 60*  ALKPHOS 100  BILITOT 1.0  PROT 6.8  ALBUMIN  3.8   No results for input(s): LIPASE, AMYLASE in the last 168 hours. No results for input(s): AMMONIA in the last 168 hours. Coagulation Profile: Recent Labs  Lab 08/08/24 1704 08/09/24 0527 08/10/24 0552 08/11/24 0420  INR 1.3* 1.5* 2.1* 2.0*   Cardiac Enzymes: No results for input(s): CKTOTAL, CKMB, CKMBINDEX, TROPONINI in the last 168 hours. BNP (last 3 results) Recent Labs    08/08/24 1316  PROBNP 31,199.0*   HbA1C: No results for input(s): HGBA1C in the last 72 hours. CBG: No results for input(s): GLUCAP in the last 168 hours. Lipid Profile: No results for input(s): CHOL, HDL, LDLCALC, TRIG, CHOLHDL, LDLDIRECT in the last 72 hours. Thyroid  Function Tests: No results for input(s): TSH, T4TOTAL, FREET4, T3FREE, THYROIDAB in the last 72 hours. Anemia Panel: Recent Labs    08/11/24 0420  VITAMINB12 569   Sepsis Labs: No results for input(s): PROCALCITON, LATICACIDVEN in the last 168 hours.  No results found for this or any previous visit (from the past 240 hours).       Radiology Studies: DG CHEST PORT 1 VIEW Result Date: 08/11/2024 CLINICAL DATA:  Pleural effusion. EXAM: PORTABLE CHEST 1 VIEW COMPARISON:  Chest radiograph dated 08/08/2024. FINDINGS: Small bilateral pleural effusions. No consolidative changes or pneumothorax. Cardiomegaly with mild central vascular congestion.  Atherosclerotic calcification of the aorta. Median sternotomy wires. No acute osseous pathology. Degenerative changes of the spine and shoulders. IMPRESSION: Cardiomegaly with mild central vascular congestion and small bilateral pleural effusions. Electronically Signed   By: Vanetta Chou M.D.   On: 08/11/2024 08:39        Scheduled Meds:  allopurinol   300 mg Oral Daily   furosemide   40 mg Intravenous BID   guaiFENesin   600 mg Oral BID   pantoprazole   40 mg Oral Daily   potassium chloride  SA  20 mEq Oral Daily   potassium chloride   40 mEq Oral Once   predniSONE   20 mg Oral Q breakfast   sodium chloride  flush  3 mL Intravenous Q12H   tamsulosin   0.4 mg Oral QPC breakfast   traMADol   50 mg Oral Daily   traZODone   50-100 mg Oral QHS   warfarin  2.5 mg Oral ONCE-1600   Warfarin - Pharmacist Dosing Inpatient   Does not apply q1600   Continuous Infusions:   LOS: 3 days  Time spent: 35 Minutes.     Owen DELENA Lore, MD [facility]   If 7PM-7AM, please contact night-coverage [email or web address]  08/11/2024, 12:29 PM

## 2024-08-11 NOTE — Progress Notes (Signed)
 PHARMACY - ANTICOAGULATION CONSULT NOTE  Pharmacy Consult for warfarin Indication: hx atrial fibrillation  Allergies[1]  Patient Measurements: Height: 5' 6 (167.6 cm) Weight: 69.1 kg (152 lb 5.4 oz) IBW/kg (Calculated) : 63.8  Vital Signs: Temp: 97.7 F (36.5 C) (12/15 0517) Temp Source: Oral (12/15 0517) BP: 147/70 (12/15 0517) Pulse Rate: 72 (12/15 0517)  Labs: Recent Labs    08/08/24 1316 08/08/24 1704 08/08/24 1704 08/09/24 0527 08/10/24 0552 08/11/24 0420  HGB 12.5*  --   --   --  10.7*  --   HCT 39.3  --   --   --  32.8*  --   PLT 197  --   --   --  186  --   APTT  --  32  --   --   --   --   LABPROT  --  16.4*   < > 18.5* 24.5* 24.1*  INR  --  1.3*   < > 1.5* 2.1* 2.0*  CREATININE 1.00  --   --  0.99 0.98 1.00   < > = values in this interval not displayed.    Estimated Creatinine Clearance: 47.9 mL/min (by C-G formula based on SCr of 1 mg/dL).   Medical History: Past Medical History:  Diagnosis Date   Aortic stenosis    Atrial fibrillation (HCC)    Baker cyst    Left knee   DJD (degenerative joint disease)    knees   Dysrhythmia    Eczema    Erectile dysfunction    Gout    History of hiatal hernia    History of radiation therapy    Right lung- 04/21/21-05/03/21 Dr. Lynwood Nasuti   History of repair of thoracic aortic aneurysm 2006   HTN (hypertension)    Mild mitral regurgitation by prior echocardiogram    Moderate tricuspid regurgitation by prior echocardiogram    MVP (mitral valve prolapse)    Shortness of breath dyspnea    with activity    Medications:  - PTA warfarin regimen: 2.5 mg daily except for 3.75 mg on Fri with INR goal 2-2.5 (per [facility] clinic note on 12/2)  Assessment: Patient is an 88 y.o M with hx afib on warfarin PTA who presented to the ED on 08/08/24 for evaluation s/p falls and c/o SOB.  Warfarin resumed on admission.  Today, 08/11/2024: - INR is therapeutic at 2 - CBC on 12/14: hgb down 10.7, plts 186K - no bleeding  documented  - no significant drug-drug intxns noted  Goal of Therapy:  INR 2-2.5 Monitor platelets by anticoagulation protocol: Yes   Plan:  - Warfarin 2.5 mg PO x1 today - daily INR - monitor for s/sx bleeding   Trichelle Lehan P 08/11/2024,9:31 AM      [1] No Known Allergies

## 2024-08-11 NOTE — Progress Notes (Signed)
 Heart Failure Navigator Progress Note  Assessed for Heart & Vascular TOC clinic readiness.  Patient does not meet criteria due to EF 50-55%, has a scheduled [facility] appointment on 08/15/2024. No HF TOC. .   Navigator will sign off at this time.   Stephane Haddock, BSN, Scientist, Clinical (histocompatibility And Immunogenetics) Only

## 2024-08-11 NOTE — Evaluation (Signed)
 Physical Therapy Evaluation Patient Details Name: [name] MRN: [id number] DOB: [birth year] Today's Date: 08/11/2024  History of Present Illness  88 year old presents complaining of shortness of breath, fall and dizziness on 08/08/24.  CT chest with no acute rib fractures, moderate to large bilateral pleural effusion with associated atelectasis and emphysema.   Admitted for heart failure exacerbation, has had orthostatic hypotension.  Past medical history significant for aortic stenosis status post TAVR on warfarin, chronic A-fib, history of thoracic aortic aneurysmal repair, hypertension, mitral valve prolapse,  Clinical Impression  Pt admitted with above diagnosis. Pt now agreeable to PT.  At baseline he is independent but does have a hx of falls.  Today, pt needing min A for transfers and min A to ambulate in order to prevent falls as he is unsteady.  He has had + orthostatic BP but was consistent between sitting and standing with therapy and asymptomatic.  Pt also with c/o spinning at times.  He was found to have + [facility] to L consistent with L posterior canal BPPV and treated with modified Epley which he tolerated well.  Pt currently with functional limitations due to the deficits listed below (see PT Problem List). Pt will benefit from acute skilled PT to increase their independence and safety with mobility to allow discharge.   With pt living alone, + orthostatic, + BPPV, hx of falls, and needing min A do recommend Patient will benefit from continued inpatient follow up therapy, <3 hours/day at d/c.  Did discuss additional need for vestibular PT follow-up either at facility or as outpt after SNF.        If plan is discharge home, recommend the following: A little help with walking and/or transfers;A little help with bathing/dressing/bathroom;Assist for transportation;Help with stairs or ramp for entrance;Assistance with cooking/housework   Can travel by private vehicle   Yes     Equipment Recommendations None recommended by PT  Recommendations for Other Services       Functional Status Assessment Patient has had a recent decline in their functional status and demonstrates the ability to make significant improvements in function in a reasonable and predictable amount of time.     Precautions / Restrictions Precautions Precautions: Fall Precaution/Restrictions Comments: orthostatic/monitor      Mobility  Bed Mobility Overal bed mobility: Needs Assistance Bed Mobility: Supine to Sit, Sit to Supine     Supine to sit: Min assist Sit to supine: Min assist        Transfers Overall transfer level: Needs assistance Equipment used: Rolling walker (2 wheels) Transfers: Sit to/from Stand Sit to Stand: Min assist           General transfer comment: STS x 2; multiple cues for hand placement; min A to steady and needed use of momentum    Ambulation/Gait Ambulation/Gait assistance: Editor, Commissioning (Feet): 80 Feet Assistive device: Rolling walker (2 wheels) Gait Pattern/deviations: Step-to pattern, Decreased stride length, Trunk flexed Gait velocity: decreased     General Gait Details: cues for posture; ambulated 80' but needing min A for balance to prevent fall  Stairs            Wheelchair Mobility     Tilt Bed    Modified Rankin (Stroke Patients Only)       Balance Overall balance assessment: Needs assistance, History of Falls Sitting-balance support: Feet supported, No upper extremity supported Sitting balance-Leahy Scale: Good     Standing balance support: Reliant on assistive device for balance,  Bilateral upper extremity supported Standing balance-Leahy Scale: Poor Standing balance comment: reliant on device                             Pertinent Vitals/Pain Pain Assessment Pain Assessment: No/denies pain    Home Living Family/patient expects to be discharged to:: Private residence Living  Arrangements: Alone Available Help at Discharge: Family;Available PRN/intermittently Type of Home: House Home Access: Stairs to enter Entrance Stairs-Rails: Left Entrance Stairs-Number of Steps: [email or web address] Alternate Level Stairs-Number of Steps: his bedroom and bathroom are on the main level of the home Home Layout: Two level;Able to live on main level with bedroom/bathroom Home Equipment: Shower seat - built in;Rollator (4 wheels);BSC/3in1;Wheelchair - manual;Crutches;Adaptive equipment      Prior Function Prior Level of Function : Independent/Modified Independent             Mobility Comments: Independent with ambulation.; does report 2 recent falls and multiple the past few months; he felt 1 recent fall due to dizzy and other knee gave out ADLs Comments: Independent with ADLs, cooking, cleaning and driving.     Extremity/Trunk Assessment   Upper Extremity Assessment Upper Extremity Assessment: Defer to OT evaluation    Lower Extremity Assessment Lower Extremity Assessment: LLE deficits/detail;RLE deficits/detail RLE Deficits / Details: ROM WFL; MMT grossly 4+/5 LLE Deficits / Details: ROM WFL; MMT grossly 4+/5    Cervical / Trunk Assessment Cervical / Trunk Assessment: Kyphotic;Other exceptions Cervical / Trunk Exceptions: reports arthritis everywehre  Communication        Cognition Arousal: Alert Behavior During Therapy: WFL for tasks assessed/performed   PT - Cognitive impairments: No apparent impairments                                 Cueing       General Comments General comments (skin integrity, edema, etc.): Pt wtih fragile skin with multiple abrasions. Sitting at arrival with BP 107/56 and in standign 107/50 - denies lightheadedness.  Post walk was 119/64.  VESTIBULAR: Hx: In addition to lightheadedness with transfers pt does report some  dizziness that feels like his body is flipping.  Occurred 2 nights prior to admission when laying flat in bed middle of night and another time when he stood up and he fell.  Lasted a few seconds.  Does have hx of recent falls but denies hitting head.  Denies recent cold, allergy, sinus infection, virus, or ear infection.  No ringing in ear and hearing baseline.   Had had + orthostatic BP.  Extraocular exam:  Smooth pursuit: intact Nystagmus resting - negative Gaze induced - L beating with looking L Head Thrust: + to left Test of Skew: Negative Saccades: intact Did report some dizziness with L head turns  Modified [facility]: + L posterior canal with rotational nystagmus lasting ~10 seconds with + symptoms.  Treated with Epley.  Due to pt's neck arthritis and posture - used bed features to assist with positioning.   Exercises     Assessment/Plan    PT Assessment Patient needs continued PT services  PT Problem List Decreased strength;Decreased range of motion;Decreased activity tolerance;Decreased balance;Decreased mobility;Decreased knowledge of use of DME       PT Treatment Interventions DME instruction;Therapeutic exercise;Gait training;Stair training;Functional mobility training;Therapeutic activities;Patient/family education;Balance training;Neuromuscular re-education;Canalith reposition    PT Goals (Current goals can be found in the Care Plan section)  Acute Rehab PT Goals Patient Stated Goal: agreeable to post acute rehab PT Goal Formulation: With patient/family Time For Goal Achievement: 08/25/24 Potential to Achieve Goals: Good Additional Goals Additional Goal #1: Pt with be independent with HEP for improved balance    Frequency Min 2X/week     Co-evaluation               AM-PAC PT 6 Clicks Mobility  Outcome Measure Help needed turning from your back to your side while in a flat bed without using bedrails?: A Little Help needed moving from lying on your  back to sitting on the side of a flat bed without using bedrails?: A Lot (needs rails) Help needed moving to and from a bed to a chair (including a wheelchair)?: A Little Help needed standing up from a chair using your arms (e.g., wheelchair or bedside chair)?: A Lot (mod cues) Help needed to walk in hospital room?: A Little Help needed climbing 3-5 steps with a railing? : A Lot 6 Click Score: 15    End of Session Equipment Utilized During Treatment: Gait belt Activity Tolerance: Patient tolerated treatment well Patient left: with chair alarm set;in chair;with family/visitor present Nurse Communication: Mobility status PT Visit Diagnosis: Other abnormalities of gait and mobility (R26.89);Dizziness and giddiness (R42)    Time: 8553-8454 (out of room 15 mins to get HEP) PT Time Calculation (min) (ACUTE ONLY): 59 min   Charges:   PT Evaluation $PT Eval Low Complexity: 1 Low PT Treatments $Gait Training: 8-22 mins $Canalith Rep Proc: 8-22 mins PT General Charges $$ ACUTE PT VISIT: 1 Visit         Benjiman, PT Acute Rehab Services [facility] [phone]   Benjiman VEAR [place] 08/11/2024, 3:58 PM

## 2024-08-11 NOTE — Plan of Care (Signed)

## 2024-08-11 NOTE — Evaluation (Signed)
 Occupational Therapy Evaluation Patient Details Name: [name] MRN: [id number] DOB: Mar 02, 1936 Today's Date: 08/11/2024   History of Present Illness   88 year old presents complaining of shortness of breath, fall and dizziness.  CT chest with no acute rib fractures, moderate to large bilateral pleural effusion with associated atelectasis and emphysema.   admitted for heart failure exacerbation.  past medical history significant for aortic stenosis status post TAVR on warfarin, chronic A-fib, history of thoracic aortic aneurysmal repair, hypertension, mitral valve prolapse,     Clinical Impressions PTA, patient lives at home alone with a supportive daughter in the area. Patient fell 2x prior to coming in and uses a RW for mobility and was relatively mod I. Currently, patient presents with deficits outlined below (see OT Problem List for details) most significantly orthostatic response to activity with decreased activity tolerance, balance, generalized muscle strength and UE ROM/coordination deficits limiting BADL's and functional mobility. Patient will benefit from continued inpatient follow up therapy, <3 hours/day. Patient requires continued Acute care hospital level OT services to progress safety and functional performance and allow for discharge.   Orthostatic vitals: BP in sitting 163/92, HR 76 SpO2 on RA 100%, standing and transfer post 5 minutes: BP 93/48, HR 88, SpO2 100%; resting seated after 2 minutes 118/55. HR 76; SpO2 100% on RA, mildly symptomatic with overall report of lightheadedness and weakness      If plan is discharge home, recommend the following:   A lot of help with walking and/or transfers;A lot of help with bathing/dressing/bathroom;Assist for transportation;Help with stairs or ramp for entrance;Assistance with cooking/housework     Functional Status Assessment   Patient has had a recent decline in their functional status and demonstrates the ability to make  significant improvements in function in a reasonable and predictable amount of time.     Equipment Recommendations   None recommended by OT     Recommendations for Other Services   PT consult     Precautions/Restrictions   Precautions Precautions: Fall Precaution/Restrictions Comments: orthostatic/monitor Restrictions Weight Bearing Restrictions Per Provider Order: No     Mobility Bed Mobility Overal bed mobility:  (patient was in recliner at start of session and remained)                  Transfers Overall transfer level: Needs assistance Equipment used: Rolling walker (2 wheels) Transfers: Sit to/from Stand, Bed to chair/wheelchair/BSC Sit to Stand: Min assist     Step pivot transfers: Min assist     General transfer comment: cues required for ahnd placement, slow position changes, with reliance on RW due to LE weakness      Balance Overall balance assessment: Needs assistance, History of Falls Sitting-balance support: Feet supported, No upper extremity supported Sitting balance-Leahy Scale: Fair     Standing balance support: Reliant on assistive device for balance, Bilateral upper extremity supported Standing balance-Leahy Scale: Poor Standing balance comment: reliant on device                           ADL either performed or assessed with clinical judgement   ADL Overall ADL's : Needs assistance/impaired Eating/Feeding: Set up;Sitting   Grooming: Wash/dry hands;Wash/dry face;Oral care;Set up;Sitting   Upper Body Bathing: Minimal assistance;Sitting   Lower Body Bathing: Moderate assistance;Maximal assistance;Sit to/from stand   Upper Body Dressing : Minimal assistance;Sitting   Lower Body Dressing: Moderate assistance;Maximal assistance;Sit to/from stand;With adaptive equipment   Toilet Transfer: Minimal assistance;BSC/3in1;Rolling walker (  2 wheels) Toilet Transfer Details (indicate cue type and reason): BSC due to  vitals Toileting- Clothing Manipulation and Hygiene: Moderate assistance;Sitting/lateral lean   Tub/ Shower Transfer: Moderate assistance   Functional mobility during ADLs: Minimal assistance;Rolling walker (2 wheels) (watch BP) General ADL Comments: limited functional reach and activity tolerance, poor shoulder ROM     Vision Baseline Vision/History: 0 No visual deficits;1 Wears glasses Ability to See in Adequate Light: 0 Adequate Patient Visual Report: No change from baseline Vision Assessment?: No apparent visual deficits;Wears glasses for reading;Wears glasses for driving Additional Comments: recent cataract surgery            Pertinent Vitals/Pain Pain Assessment Pain Assessment: No/denies pain     Extremity/Trunk Assessment Upper Extremity Assessment Upper Extremity Assessment: Right hand dominant;Generalized weakness;RUE deficits/detail;LUE deficits/detail RUE Deficits / Details: hx of RTC and OA deformities with ~0-50 degrees AROM at shoulder and OA changes in hands limiting reach and use RUE Coordination: decreased fine motor LUE Deficits / Details: hx of RTC and OA deformities with ~0-50 degrees AROM at shoulder and OA changes in hands limiting reach and use LUE Coordination: decreased fine motor   Lower Extremity Assessment Lower Extremity Assessment: Defer to PT evaluation   Cervical / Trunk Assessment Cervical / Trunk Assessment: Kyphotic   Communication Communication Communication: No apparent difficulties   Cognition Arousal: Alert Behavior During Therapy: WFL for tasks assessed/performed Cognition: No apparent impairments                               Following commands: Intact       Cueing  General Comments   Cueing Techniques: Verbal cues  multiple skin abrasions on shins, hands and bruising on buttocks from 2 falls at home, no edema or SOB, orthostatics taken: BP in sitting 163/92, HR 76 SpO2 on RA 100%, standing and transfer post  5 minutes: BP 93/48, HR 88, SpO2 100%; resting seated after 2 minutes 118/55. HR 76; SpO2 100% on RA, mildly symptomatic with overall report of lightheadedness and weakness           Home Living Family/patient expects to be discharged to:: Private residence Living Arrangements: Alone Available Help at Discharge: Family;Available PRN/intermittently (dtr supportive) Type of Home: House Home Access: Stairs to enter [employer] of Steps: 6 Entrance Stairs-Rails: Left Home Layout: Two level;Able to live on main level with bedroom/bathroom Alternate Level Stairs-Number of Steps: his bedroom and bathroom are on the main level of the home   Bathroom Shower/Tub: Walk-in shower;Door   [employer] Toilet: Standard     Home Equipment: Shower seat - built in;Rollator (4 wheels);BSC/3in1;Wheelchair - manual;Crutches;Adaptive equipment Adaptive Equipment: Reacher;Sock aid;Long-handled shoe horn;Long-handled sponge        Prior Functioning/Environment Prior Level of Function : Independent/Modified Independent             Mobility Comments: Independent with ambulation. ADLs Comments: Independent with ADLs, cooking, cleaning and driving.    OT Problem List: Decreased strength;Decreased range of motion;Decreased activity tolerance;Impaired balance (sitting and/or standing)   OT Treatment/Interventions: Self-care/ADL training;Therapeutic exercise;Neuromuscular education;Energy conservation;DME and/or AE instruction;Therapeutic activities;Patient/family education;Balance training      OT Goals(Current goals can be found in the care plan section)   Acute Rehab OT Goals Patient Stated Goal: to get stronger in rehab OT Goal Formulation: With patient Time For Goal Achievement: 08/25/24 Potential to Achieve Goals: Fair ADL Goals Pt Will Perform Lower Body Bathing: with supervision;sit to/from stand;with  adaptive equipment Pt Will Perform Lower Body Dressing: with contact guard  assist;with adaptive equipment;sit to/from stand Pt Will Transfer to Toilet: with contact guard assist;regular height toilet;ambulating Pt Will Perform Toileting - Clothing Manipulation and hygiene: with supervision;sit to/from stand Pt Will Perform Tub/Shower Transfer: with min assist;shower seat;rolling walker;Shower transfer Pt/caregiver will Perform Home Exercise Program: Increased strength;With written HEP provided;Both right and left upper extremity   OT Frequency:  Min 2X/week       AM-PAC OT 6 Clicks Daily Activity     Outcome Measure Help from another person eating meals?: A Little Help from another person taking care of personal grooming?: A Little Help from another person toileting, which includes using toliet, bedpan, or urinal?: A Lot Help from another person bathing (including washing, rinsing, drying)?: A Lot Help from another person to put on and taking off regular upper body clothing?: A Little Help from another person to put on and taking off regular lower body clothing?: A Lot 6 Click Score: 15   End of Session Equipment Utilized During Treatment: Gait belt;Rolling walker (2 wheels) Nurse Communication: Mobility status;Other (comment) (orthotatics entered)  Activity Tolerance: Patient limited by lethargy Patient left: in chair;with call bell/phone within reach;with chair alarm set  OT Visit Diagnosis: Unsteadiness on feet (R26.81);Repeated falls (R29.6);Muscle weakness (generalized) (M62.81)                Time: 9093-9064 OT Time Calculation (min): 29 min Charges:  OT General Charges $OT Visit: 1 Visit OT Evaluation $OT Eval Low Complexity: 1 Low OT Treatments $Self Care/Home Management : 8-22 mins  Joliana Claflin OT/L Acute Rehabilitation Department  [phone]  08/11/2024, 11:11 AM

## 2024-08-12 ENCOUNTER — Ambulatory Visit

## 2024-08-12 LAB — BASIC METABOLIC PANEL WITH GFR
Anion gap: 7 (ref 5–15)
BUN: 25 mg/dL — ABNORMAL HIGH (ref 8–23)
CO2: 32 mmol/L (ref 22–32)
Calcium: 8.8 mg/dL — ABNORMAL LOW (ref 8.9–10.3)
Chloride: 101 mmol/L (ref 98–111)
Creatinine, Ser: 1.19 mg/dL (ref 0.61–1.24)
GFR, Estimated: 59 mL/min — ABNORMAL LOW (ref >60)
Glucose, Bld: 96 mg/dL (ref 70–99)
Potassium: 3.6 mmol/L (ref 3.5–5.1)
Sodium: 140 mmol/L (ref 135–145)

## 2024-08-12 LAB — PROTIME-INR
INR: 2 — ABNORMAL HIGH (ref 0.8–1.2)
Prothrombin Time: 23.8 s — ABNORMAL HIGH (ref 11.4–15.2)

## 2024-08-12 MED ORDER — PANTOPRAZOLE SODIUM 40 MG PO TBEC: 40.0000 mg | DELAYED_RELEASE_TABLET | Freq: Every day | ORAL | 2 refills | Status: AC

## 2024-08-12 MED ORDER — FUROSEMIDE 40 MG PO TABS: 60.0000 mg | ORAL_TABLET | Freq: Every day | ORAL | Status: DC

## 2024-08-12 MED ORDER — FUROSEMIDE 40 MG PO TABS: 40.0000 mg | ORAL_TABLET | Freq: Every day | ORAL | 0 refills | Status: AC

## 2024-08-12 MED ORDER — GUAIFENESIN ER 600 MG PO TB12: 600.0000 mg | ORAL_TABLET | Freq: Two times a day (BID) | ORAL | 0 refills | Status: AC

## 2024-08-12 MED ORDER — FUROSEMIDE 40 MG PO TABS
40.0000 mg | ORAL_TABLET | Freq: Every day | ORAL | Status: DC
  Administered 2024-08-13 – 2024-08-14 (×2): 40 mg via ORAL
  Filled 2024-08-12: qty 1
  Filled 2024-08-12: qty 2

## 2024-08-12 MED ORDER — WARFARIN SODIUM 2.5 MG PO TABS
2.5000 mg | ORAL_TABLET | Freq: Once | ORAL | Status: AC
  Administered 2024-08-12: 17:00:00 2.5 mg via ORAL
  Filled 2024-08-12: qty 1

## 2024-08-12 MED ORDER — TRAMADOL HCL 50 MG PO TABS: 50.0000 mg | ORAL_TABLET | Freq: Two times a day (BID) | ORAL | 0 refills | Status: AC | PRN

## 2024-08-12 MED ORDER — TRAZODONE HCL 50 MG PO TABS: 50.0000 mg | ORAL_TABLET | Freq: Every day | ORAL | 0 refills | Status: AC

## 2024-08-12 MED ORDER — ALBUTEROL SULFATE (2.5 MG/3ML) 0.083% IN NEBU: 3.0000 mL | INHALATION_SOLUTION | RESPIRATORY_TRACT | 12 refills | Status: AC | PRN

## 2024-08-12 MED ADMIN — Potassium Chloride Microencapsulated Crys ER Tab 20 mEq: 20 meq | ORAL | @ 09:00:00 | NDC 00245531989

## 2024-08-12 NOTE — Plan of Care (Signed)

## 2024-08-12 NOTE — NC FL2 (Signed)
 [place]  MEDICAID FL2 LEVEL OF CARE FORM     IDENTIFICATION  Patient Name: [name] Birthdate: 1936/04/24 Sex: male Admission Date (Current Location): 08/08/2024  [facility] and [place] Number:  Producer, Television/film/video and Address:  [facility],  [address] [place], [place] [address]      Provider Number: [id number]  Attending Physician Name and Address:  Madelyne Owen LABOR, MD  Relative Name and Phone Number:  [name] Perish) [phone]    Current Level of Care: Hospital Recommended Level of Care: Skilled Nursing Facility Prior Approval Number:    Date Approved/Denied:   PASRR Number: [id number]  Discharge Plan: SNF    Current Diagnoses: Patient Active Problem List   Diagnosis Date Noted   CHF exacerbation (HCC) 08/08/2024   Fall at home 04/19/2024   Permanent atrial fibrillation (HCC) 04/19/2024   Normocytic anemia 04/19/2024   Frequent PVCs 04/19/2024   Abrasion of both knees 04/19/2024   Hyperbilirubinemia 04/19/2024   Physical deconditioning 04/19/2024   Contusion of right knee 04/19/2024   Syncope 04/19/2024   Atherosclerosis of native arteries of extremity with intermittent claudication 02/16/2024   Melena 09/20/2023   Acute blood loss anemia 09/20/2023   Symptomatic anemia 09/20/2023   GI bleed 09/19/2023   Hypersensitivity pneumonitis (HCC) 06/04/2023   Centrilobular emphysema (HCC) 06/04/2023   Elevated PSA 10/30/2022   Non-small cell lung cancer (HCC) 06/23/2022   Hemoptysis 03/07/2021   Pulmonary nodules 03/07/2021   Hypokalemia 09/24/2019   Vitamin D  deficiency 09/24/2019   Vitamin B12 deficiency 09/24/2019   Chronic diastolic CHF (congestive heart failure) (HCC) 09/17/2018   Nontraumatic epidural hematoma (HCC) 05/01/2017   Routine general medical examination at a health care facility 08/17/2015   Dyspnea 06/10/2015   History of total knee arthroplasty 04/14/2015   Spinal stenosis, lumbar region, with neurogenic  claudication 01/15/2015   Encounter for therapeutic drug monitoring 10/13/2013   S/P AVR (aortic valve replacement) 07/03/2013   Urticaria 11/27/2011   Dysphonia 11/07/2011   Anxiety 04/04/2011   CAROTID BRUIT 03/23/2009   Osteoarthritis 03/22/2009   Chronic atrial fibrillation (HCC) 03/16/2009   Aortic valve disorder 01/21/2008   ERECTILE DYSFUNCTION, 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   Gout 04/17/2007   Essential hypertension 04/17/2007    Orientation RESPIRATION BLADDER Height & Weight     Self, Time, Situation, Place  Normal Continent Weight: 69.1 kg Height:  5' 6 (167.6 cm)  BEHAVIORAL SYMPTOMS/MOOD NEUROLOGICAL BOWEL NUTRITION STATUS      Continent Diet (Heart Healthy)  AMBULATORY STATUS COMMUNICATION OF NEEDS Skin   Limited Assist Verbally Normal                       Personal Care Assistance Level of Assistance  Bathing, Feeding, Dressing Bathing Assistance: Limited assistance Feeding assistance: Limited assistance       Functional Limitations Info  Sight, Hearing, Speech Sight Info: Impaired (eyeglasses) Hearing Info: Adequate Speech Info: Adequate    SPECIAL CARE FACTORS FREQUENCY  PT (By licensed PT), OT (By licensed OT)     PT Frequency: 5x week OT Frequency: 5x week            Contractures Contractures Info: Not present    Additional Factors Info  Code Status, Allergies Code Status Info: Full Allergies Info: NKDA           Current Medications (08/12/2024):  This is the current hospital active medication list Current Facility-Administered Medications  Medication Dose Route Frequency Provider Last Rate Last  Admin   acetaminophen  (TYLENOL ) tablet 650 mg  650 mg Oral Q4H PRN Sim Emery CROME, MD       albuterol  (PROVENTIL ) (2.5 MG/3ML) 0.083% nebulizer solution 3 mL  3 mL Inhalation Q4H PRN Pokhrel, Laxman, MD       allopurinol  (ZYLOPRIM ) tablet 300 mg  300 mg Oral Daily Sim Emery L, MD   300 mg at 08/12/24 0916   [START ON 08/13/2024]  furosemide  (LASIX ) tablet 40 mg  40 mg Oral Daily Regalado, Belkys A, MD       guaiFENesin  (MUCINEX ) 12 hr tablet 600 mg  600 mg Oral BID Regalado, Belkys A, MD   600 mg at 08/12/24 0916   ondansetron  (ZOFRAN ) injection 4 mg  4 mg Intravenous Q6H PRN Sim Emery CROME, MD       pantoprazole  (PROTONIX ) EC tablet 40 mg  40 mg Oral Daily Regalado, Belkys A, MD   40 mg at 08/12/24 0916   potassium chloride  SA (KLOR-CON  M) CR tablet 20 mEq  20 mEq Oral Daily Sim Emery L, MD   20 mEq at 08/12/24 0916   predniSONE  (DELTASONE ) tablet 20 mg  20 mg Oral Q breakfast Sim Emery L, MD   20 mg at 08/12/24 0917   scopolamine  (TRANSDERM-SCOP) 1 MG/3DAYS 1 mg  1 patch Transdermal Q72H Daniels, James K, NP   1 mg at 08/11/24 2206   sodium chloride  flush (NS) 0.9 % injection 3 mL  3 mL Intravenous Q12H Sim Emery L, MD   3 mL at 08/12/24 0924   sodium chloride  flush (NS) 0.9 % injection 3 mL  3 mL Intravenous PRN Sim Emery CROME, MD       tamsulosin  (FLOMAX ) capsule 0.4 mg  0.4 mg Oral QPC breakfast Sim Emery L, MD   0.4 mg at 08/12/24 9083   traMADol  (ULTRAM ) tablet 50 mg  50 mg Oral Daily Garba, Mohammad L, MD   50 mg at 08/12/24 9083   traMADol  (ULTRAM ) tablet 50 mg  50 mg Oral Daily PRN Sim Emery CROME, MD       traZODone  (DESYREL ) tablet 50-100 mg  50-100 mg Oral QHS Sim Emery L, MD   100 mg at 08/11/24 2124   warfarin (COUMADIN ) tablet 2.5 mg  2.5 mg Oral ONCE-1600 Nicholaus Quarry, [facility]       Warfarin - Pharmacist Dosing Inpatient   Does not apply [id number] Garba, Mohammad L, MD         Discharge Medications: Please see discharge summary for a list of discharge medications.  Relevant Imaging Results:  Relevant Lab Results:   Additional Information SS#[id number]  Bhavin Monjaraz, Nathanel, RN

## 2024-08-12 NOTE — Telephone Encounter (Signed)
 Review of chart reveals pt will be d/c today and will go into rehab facility. Current INR result is in range. Will continue reviewing chart periodically for updates.

## 2024-08-12 NOTE — Progress Notes (Unsigned)
 Cardiology Clinic Note   Patient Name: [name] Date of Encounter: 08/12/2024  Primary Care Provider:  Theophilus Andrews, Tully GRADE, MD Primary Cardiologist:  Redell Shallow, MD  Patient Profile    [name] 88 year old male presents the clinic today for follow-up evaluation of his chronic diastolic CHF and atrial fibrillation.  Past Medical History    Past Medical History:  Diagnosis Date   Aortic stenosis    Atrial fibrillation (HCC)    Baker cyst    Left knee   DJD (degenerative joint disease)    knees   Dysrhythmia    Eczema    Erectile dysfunction    Gout    History of hiatal hernia    History of radiation therapy    Right lung- 04/21/21-05/03/21 Dr. Lynwood Nasuti   History of repair of thoracic aortic aneurysm 2006   HTN (hypertension)    Mild mitral regurgitation by prior echocardiogram    Moderate tricuspid regurgitation by prior echocardiogram    MVP (mitral valve prolapse)    Shortness of breath dyspnea    with activity   Past Surgical History:  Procedure Laterality Date    polyps vocal cord  1994   AORTIC VALVE REPLACEMENT  ~2004   BRONCHIAL BIOPSY  03/07/2021   Procedure: BRONCHIAL BIOPSIES;  Surgeon: Shelah Lamar RAMAN, MD;  Location: [facility] ENDOSCOPY;  Service: Pulmonary;;   BRONCHIAL BIOPSY  11/14/2021   Procedure: BRONCHIAL BIOPSIES;  Surgeon: Shelah Lamar RAMAN, MD;  Location: [facility] ENDOSCOPY;  Service: Pulmonary;;   BRONCHIAL BRUSHINGS  03/07/2021   Procedure: BRONCHIAL BRUSHINGS;  Surgeon: Shelah Lamar RAMAN, MD;  Location: [facility] ENDOSCOPY;  Service: Pulmonary;;   BRONCHIAL BRUSHINGS  11/14/2021   Procedure: BRONCHIAL BRUSHINGS;  Surgeon: Shelah Lamar RAMAN, MD;  Location: [facility] ENDOSCOPY;  Service: Pulmonary;;   BRONCHIAL NEEDLE ASPIRATION BIOPSY  03/07/2021   Procedure: BRONCHIAL NEEDLE ASPIRATION BIOPSIES;  Surgeon: Shelah Lamar RAMAN, MD;  Location: [facility] ENDOSCOPY;  Service: Pulmonary;;   BRONCHIAL NEEDLE ASPIRATION BIOPSY  11/14/2021   Procedure: BRONCHIAL  NEEDLE ASPIRATION BIOPSIES;  Surgeon: Shelah Lamar RAMAN, MD;  Location: [facility] ENDOSCOPY;  Service: Pulmonary;;   BRONCHIAL WASHINGS  09/21/2020   Procedure: BRONCHIAL WASHINGS;  Surgeon: Kara Dorn NOVAK, MD;  Location: [facility] ENDOSCOPY;  Service: Pulmonary;;  BAL    BRONCHIAL WASHINGS  03/07/2021   Procedure: BRONCHIAL WASHINGS;  Surgeon: Shelah Lamar RAMAN, MD;  Location: [facility] ENDOSCOPY;  Service: Pulmonary;;   BRONCHIAL WASHINGS  11/14/2021   Procedure: BRONCHIAL WASHINGS;  Surgeon: Shelah Lamar RAMAN, MD;  Location: [facility] ENDOSCOPY;  Service: Pulmonary;;   CATARACT EXTRACTION W/PHACO Left 07/17/2024   Procedure: PHACOEMULSIFICATION, CATARACT, WITH IOL INSERTION 7.36 01:03.9;  Surgeon: Enola Feliciano Hugger, MD;  Location: [facility];  Service: Ophthalmology;  Laterality: Left;   CATARACT EXTRACTION W/PHACO Right 07/31/2024   Procedure: PHACOEMULSIFICATION, CATARACT, WITH IOL INSERTION 7.44 01:03.4;  Surgeon: Enola Feliciano Hugger, MD;  Location: [facility] SURGERY CNTR;  Service: Ophthalmology;  Laterality: Right;   FIDUCIAL MARKER PLACEMENT  03/07/2021   Procedure: FIDUCIAL MARKER PLACEMENT;  Surgeon: Shelah Lamar RAMAN, MD;  Location: [facility] ENDOSCOPY;  Service: Pulmonary;;   HERNIA REPAIR  [phone]   HERNIA REPAIR  [phone]   IR ANGIOGRAM PELVIS SELECTIVE OR SUPRASELECTIVE  12/01/2022   IR ANGIOGRAM PELVIS SELECTIVE OR SUPRASELECTIVE  12/01/2022   IR ANGIOGRAM SELECTIVE EACH ADDITIONAL VESSEL  12/01/2022   IR ANGIOGRAM SELECTIVE EACH ADDITIONAL VESSEL  12/01/2022   IR CT SPINE LTD  12/01/2022   IR  CT SPINE LTD  12/01/2022   IR CT SPINE LTD  12/01/2022   IR EMBO ARTERIAL NOT HEMORR HEMANG INC GUIDE ROADMAPPING  12/01/2022   IR EMBO TUMOR ORGAN ISCHEMIA INFARCT INC GUIDE ROADMAPPING  12/01/2022   IR RADIOLOGIST EVAL & MGMT  11/17/2022   IR US  GUIDE VASC ACCESS LEFT  12/01/2022   IR US  GUIDE VASC ACCESS RIGHT  12/01/2022   LUMBAR LAMINECTOMY/DECOMPRESSION MICRODISCECTOMY N/A 01/15/2015   Procedure: 2 LEVEL DECOMPRESSIVE LUMBAR  LAMINECTOMY L3-L4,L4-L5;  Surgeon: Tanda Heading, MD;  Location: [facility] ORS;  Service: Orthopedics;  Laterality: N/A;   LUMBAR LAMINECTOMY/DECOMPRESSION MICRODISCECTOMY N/A 05/01/2017   Procedure: Lumbar one-Lumbar two, Lumbar two-Lumbar three, Lumbar three-Lumbar four Laminectomy; Evacuation of hematoma;  Surgeon: Lanis Pupa, MD;  Location: [facility] OR;  Service: Neurosurgery;  Laterality: N/A;   right total hip  ~4 years ago   ROTATOR CUFF REPAIR Left ~2009   TOTAL KNEE ARTHROPLASTY Left 04/14/2015   Procedure: LEFT TOTAL KNEE ARTHROPLASTY;  Surgeon: Tanda Heading, MD;  Location: [facility] ORS;  Service: Orthopedics;  Laterality: Left;   VIDEO BRONCHOSCOPY N/A 09/21/2020   Procedure: VIDEO BRONCHOSCOPY WITHOUT FLUORO;  Surgeon: Kara Dorn NOVAK, MD;  Location: [facility] ENDOSCOPY;  Service: Pulmonary;  Laterality: N/A;   VIDEO BRONCHOSCOPY WITH ENDOBRONCHIAL NAVIGATION Right 03/07/2021   Procedure: VIDEO BRONCHOSCOPY WITH ENDOBRONCHIAL NAVIGATION;  Surgeon: Shelah Lamar RAMAN, MD;  Location: [facility] ENDOSCOPY;  Service: Pulmonary;  Laterality: Right;   VIDEO BRONCHOSCOPY WITH RADIAL ENDOBRONCHIAL ULTRASOUND  11/14/2021   Procedure: VIDEO BRONCHOSCOPY WITH RADIAL ENDOBRONCHIAL ULTRASOUND;  Surgeon: Shelah Lamar RAMAN, MD;  Location: [facility] ENDOSCOPY;  Service: Pulmonary;;    Allergies  No Known Allergies  History of Present Illness    [name] has a PMH of aortic stenosis and is status post AVR and thoracic aortic aneurysm repair 7/06.  He underwent preoperative cardiac cath which showed normal coronary arteries.  He had carotid Dopplers due to bruits which showed 0-39% bilateral stenosis.  He was admitted 9/18 with back pain for planned laminectomy.  However, he was found to have spontaneous epidural hematoma which was evacuated.  His anticoagulation was carefully resumed afterwards.  His cardiac event monitor 4/19 showed atrial fibrillation with multiple pauses greater than 3 seconds that occurred late p.m. in early  a.m. hours.  He was referred to EP following his results for consideration of PPM.  He declined.  His echocardiogram 5/24 showed normal LVEF, prior aortic valve replacement with mean gradient of 60 mmHg and mild aortic insufficiency, severe LVH, severe biatrial enlargement, mild mitral regurgitation.  He was noted to have bleeding following tooth extraction 1/25.   His PMH also includes chronic diastolic CHF, essential hypertension, and permanent atrial fibrillation.  He was seen in follow-up by Dr. Pietro on 01/01/2024.  During that time he did note dyspnea with increased physical activity but denied this with normal routine activities.  He denied orthopnea and PND.  He denied lower extremity swelling, chest pain and syncope.  He denied recurrent bleeding issues.  He was admitted to the hospital on 04/19/2024 and discharged on 04/23/2024.  He had a fall at home.  Head CT showed no acute abnormalities.  His EKG showed sinus tachycardia.  He was placed on telemetry.  He was noted to be rate controlled.  CHA2DS2-VASc score 5.  He was noted to have frequent PVCs.  He was noted to have hypomagnesemia.  He received magnesium  supplementation.  Physical therapy was prescribed for deconditioning.  Formal PT OT evaluation recommended  at SNF.  He presented to the clinic 04/25/24 for follow-up evaluation and stated he was doing physical therapy at [facility].  He had not had any further episodes of syncope.  It was felt that his episode was related to deconditioning.  We reviewed his previous cardiac event monitor.  His blood pressure was 108/58.  We also discussed risk for falls and anticoagulation.  I reviewed Watchman procedure.  I instructed him and his daughter to contact us  if they would want to pursue this further.  They expressed understanding.  I continued his medication regimen and planned follow-up in 3 to 4 months.  He presented to [facility] emergency department 08/08/2024.  He reported a fall,  dizziness, and shortness of breath.  His blood pressure was noted to be 132/84.  He reported that he had fallen twice over the past 2 days.  He continued to live at home alone.  He noted that he would stand up and become lightheaded with vertigo and fall.  He denied loss of consciousness.  He denied chest pain.  He was felt to be hemodynamically stable.  He was noted to have decreased lung sounds bilaterally in his lower lobes.  CT head showed no evidence of bleed.  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  Brain MRI was recommended to evaluate vertigo if symptoms remained.  CT chest showed large pleural effusion.  He was started on IV Lasix .  It was felt that he also had possible heart failure exacerbation.  His chest x-ray looked better with smaller pleural effusion after diuresis.  His BNP was noted to be elevated at 31,000.  His weight at discharge was 152 pounds.  His amlodipine  was discontinued due to orthostatic hypotension.  He was instructed to follow-up with cardiology for further evaluation and for adjustment of his diuresis.  He presents to the clinic today for follow-up evaluation and states***.  Today he denies chest pain, increased shortness of breath, lower extremity edema, fatigue, , hematuria, hemoptysis, diaphoresis, presyncope, syncope, orthopnea, and PND.   Home Medications    Prior to Admission medications   Medication Sig Start Date End Date Taking? Authorizing Provider  allopurinol  (ZYLOPRIM ) 300 MG tablet Take 1 tablet by mouth once daily 11/07/23   Theophilus Andrews, Tully GRADE, MD  amLODipine  (NORVASC ) 5 MG tablet Take 1 tablet by mouth once daily 11/07/23   Hernandez Acosta, Estela Y, MD  benzonatate  (TESSALON ) 100 MG capsule Take 1 capsule (100 mg total) by mouth 3 (three) times daily as needed for cough. 04/23/24   Pokhrel, Vernal, MD  cyanocobalamin  (VITAMIN B12) 500 MCG tablet Take 1 tablet (500 mcg total) by mouth daily. Patient taking differently: Take 500 mcg by mouth  daily. BID 09/22/23   Regalado, Belkys A, MD  feeding supplement (ENSURE PLUS HIGH PROTEIN) LIQD Take 237 mLs by mouth 2 (two) times daily between meals. 04/23/24   Pokhrel, Laxman, MD  furosemide  (LASIX ) 20 MG tablet Take 1 tablet (20 mg total) by mouth daily after breakfast. 04/23/24   Pokhrel, Laxman, MD  ipratropium-albuterol  (DUONEB) 0.5-2.5 (3) MG/3ML SOLN Take 3 mLs by nebulization every 3 (three) hours as needed. 04/23/24   Pokhrel, Vernal, MD  magnesium  oxide (MAG-OX) 400 (240 Mg) MG tablet Take 1 tablet (400 mg total) by mouth 2 (two) times daily for 5 days. 04/23/24 04/28/24  Pokhrel, Laxman, MD  mupirocin  ointment (BACTROBAN ) 2 % Apply topically 2 (two) times daily. 04/23/24   Pokhrel, Vernal, MD  ondansetron  (ZOFRAN ) 4  MG tablet Take 1 tablet (4 mg total) by mouth every 6 (six) hours as needed for nausea. 04/23/24   Pokhrel, Laxman, MD  potassium chloride  SA (KLOR-CON  M) 20 MEQ tablet Take 1 tablet (20 mEq total) by mouth daily. Patient taking differently: Take 20 mEq by mouth in the morning. 07/11/23   Pietro Redell RAMAN, MD  predniSONE  (DELTASONE ) 20 MG tablet Take 1 tablet by mouth once daily with breakfast 01/10/24   Theophilus Andrews, Tully GRADE, MD  tamsulosin  (FLOMAX ) 0.4 MG CAPS capsule Take 1 capsule (0.4 mg total) by mouth daily after supper. 04/23/24   Pokhrel, Laxman, MD  traMADol  (ULTRAM ) 50 MG tablet Take 1 tablet (50 mg total) by mouth every 12 (twelve) hours as needed. 04/23/24 04/23/25  Pokhrel, Laxman, MD  traZODone  (DESYREL ) 50 MG tablet TAKE 1 TABLET BY MOUTH AT BEDTIME AS NEEDED Patient taking differently: Take 50 mg by mouth at bedtime as needed for sleep. 03/27/24   Theophilus Andrews, Tully GRADE, MD  warfarin (COUMADIN ) 5 MG tablet TAKE 1/2 (ONE-HALF) A TABLET BY MOUTH ONCE DAILY EXCEPT  TAKE 1 TABLET ON WEDNESDAY AS DIRECTED  BY  ANTICOAGULATION  CLINIC Patient taking differently: Take 2.5 mg by mouth daily. TAKE 1/2 (ONE-HALF) A TABLET BY MOUTH ONCE DAILY EXCEPT  TAKE 1 TABLET ON  WEDNESDAY AS DIRECTED  BY  ANTICOAGULATION  CLINIC 03/27/24   Theophilus Andrews, Tully GRADE, MD    Family History    Family History  Problem Relation Age of Onset   Diabetes Other    Stroke Other    He indicated that his mother is deceased. He indicated that his father is deceased. He indicated that his maternal grandmother is deceased. He indicated that his maternal grandfather is deceased. He indicated that his paternal grandmother is deceased. He indicated that his paternal grandfather is deceased. He indicated that the status of his other is unknown.  Social History    Social History   Socioeconomic History   Marital status: Divorced    Spouse name: Not on file   Number of children: Not on file   Years of education: Not on file   Highest education level: Not on file  Occupational History   Not on file  Tobacco Use   Smoking status: Former    Current packs/day: 0.00    Types: Cigarettes    Start date: 08/28/1972    Quit date: 08/28/2002    Years since quitting: 21.9   Smokeless tobacco: Never  Vaping Use   Vaping status: Never Used  Substance and Sexual Activity   Alcohol use: Yes    Alcohol/week: 3.0 standard drinks of alcohol    Types: 3 Shots of liquor per week    Comment: social on Fri/Sat   Drug use: No   Sexual activity: Not on file  Other Topics Concern   Not on file  Social History Narrative   Not on file   Social Drivers of Health   Tobacco Use: Medium Risk (08/10/2024)   Patient History    Smoking Tobacco Use: Former    Smokeless Tobacco Use: Never    Passive Exposure: Not on file  Financial Resource Strain: Low Risk (10/30/2023)   Overall Financial Resource Strain (CARDIA)    Difficulty of Paying Living Expenses: Not hard at all  Food Insecurity: No Food Insecurity (08/09/2024)   Epic    Worried About Radiation Protection Practitioner of Food in the Last Year: Never true    Ran Out of Food in the  Last Year: Never true  Transportation Needs: No Transportation Needs  (08/09/2024)   Epic    Lack of Transportation (Medical): No    Lack of Transportation (Non-Medical): No  Physical Activity: Insufficiently Active (10/30/2023)   Exercise Vital Sign    Days of Exercise per Week: 3 days    Minutes of Exercise per Session: 30 Jared  Stress: No Stress Concern Present (10/30/2023)   [employer] of Occupational Health - Occupational Stress Questionnaire    Feeling of Stress : Not at all  Social Connections: Moderately Isolated (08/09/2024)   Social Connection and Isolation Panel    Frequency of Communication with Friends and Family: More than three times a week    Frequency of Social Gatherings with Friends and Family: Once a week    Attends Religious Services: More than 4 times per year    Active Member of [employer] or Organizations: No    Attends Banker Meetings: Never    Marital Status: Widowed  Intimate Partner Violence: Not At Risk (08/09/2024)   Epic    Fear of Current or Ex-Partner: No    Emotionally Abused: No    Physically Abused: No    Sexually Abused: No  Depression (PHQ2-9): Low Risk (05/15/2024)   Depression (PHQ2-9)    PHQ-2 Score: 0  Alcohol Screen: Low Risk (10/30/2023)   Alcohol Screen    Last Alcohol Screening Score (AUDIT): 0  Housing: Low Risk (08/09/2024)   Epic    Unable to Pay for Housing in the Last Year: No    Number of Times Moved in the Last Year: 0    Homeless in the Last Year: No  Utilities: Not At Risk (08/09/2024)   Epic    Threatened with loss of utilities: No  Health Literacy: Adequate Health Literacy (10/30/2023)   B1300 Health Literacy    Frequency of need for help with medical instructions: Never     Review of Systems    General:  No chills, fever, night sweats or weight changes.  Cardiovascular:  No chest pain, dyspnea on exertion, edema, orthopnea, palpitations, paroxysmal nocturnal dyspnea. Dermatological: No rash, lesions/masses Respiratory: No cough, dyspnea Urologic: No hematuria,  dysuria Abdominal:   No nausea, vomiting, diarrhea, bright red blood per rectum, melena, or hematemesis Neurologic:  No visual changes, wkns, changes in mental status. All other systems reviewed and are otherwise negative except as noted above.  Physical Exam    VS:  There were no vitals taken for this visit. , BMI There is no height or weight on file to calculate BMI. GEN: Well nourished, well developed, in no acute distress. HEENT: normal. Neck: Supple, no JVD, carotid bruits, or masses. Cardiac: RRR, no murmurs, rubs, or gallops. No clubbing, cyanosis, edema.  Radials/DP/PT 2+ and equal bilaterally.  Respiratory:  Respirations regular and unlabored, clear to auscultation bilaterally. GI: Soft, nontender, nondistended, BS + x 4. MS: no deformity or atrophy. Skin: warm and dry, no rash. Neuro:  Strength and sensation are intact. Psych: Normal affect.  Accessory Clinical Findings    Recent Labs: 10/30/2023: TSH 0.72 08/08/2024: ALT 60; Pro Brain Natriuretic Peptide 31,199.0 08/10/2024: Hemoglobin 10.7; Magnesium  2.1; Platelets 186 08/12/2024: BUN 25; Creatinine, Ser 1.19; Potassium 3.6; Sodium 140   Recent Lipid Panel    Component Value Date/Time   CHOL 207 (H) 10/30/2023 1036   TRIG 89.0 10/30/2023 1036   HDL 70.10 10/30/2023 1036   CHOLHDL 3 10/30/2023 1036   VLDL 17.8 10/30/2023 1036   LDLCALC 119 (  H) 10/30/2023 1036         ECG personally reviewed by me today-     Echocardiogram 04/20/2024   IMPRESSIONS     1. Left ventricular ejection fraction, by estimation, is 50 to 55%. The  left ventricle has low normal function. The left ventricle has no regional  wall motion abnormalities. There is moderate left ventricular hypertrophy.  Left ventricular diastolic  parameters are indeterminate.   2. Right ventricular systolic function is normal. The right ventricular  size is normal. There is moderately elevated pulmonary artery systolic  pressure.   3. Left atrial  size was moderately dilated.   4. Right atrial size was moderately dilated.   5. The mitral valve is abnormal. Mild mitral valve regurgitation.  Moderate mitral annular calcification.   6. Tricuspid valve regurgitation is moderate.   7. Biprothetic AVR stable mean gradient mild AR no PVL no details in Epic  on size/type of valve. The aortic valve has been repaired/replaced. Aortic  valve regurgitation is mild. There is a bioprosthetic valve present in the  aortic position.   8. The inferior vena cava is dilated in size with >50% respiratory  variability, suggesting right atrial pressure of 8 mmHg.   FINDINGS   Left Ventricle: Left ventricular ejection fraction, by estimation, is 50  to 55%. The left ventricle has low normal function. The left ventricle has  no regional wall motion abnormalities. Strain was performed and the global  longitudinal strain is  indeterminate. The left ventricular internal cavity size was normal in  size. There is moderate left ventricular hypertrophy. Left ventricular  diastolic parameters are indeterminate.   Right Ventricle: The right ventricular size is normal. No increase in  right ventricular wall thickness. Right ventricular systolic function is  normal. There is moderately elevated pulmonary artery systolic pressure.  The tricuspid regurgitant velocity is  3.45 m/s, and with an assumed right atrial pressure of 8 mmHg, the  estimated right ventricular systolic pressure is 55.6 mmHg.   Left Atrium: Left atrial size was moderately dilated.   Right Atrium: Right atrial size was moderately dilated.   Pericardium: There is no evidence of pericardial effusion.   Mitral Valve: The mitral valve is abnormal. There is moderate thickening  of the mitral valve leaflet(s). There is mild calcification of the mitral  valve leaflet(s). Moderate mitral annular calcification. Mild mitral valve  regurgitation. MV peak gradient,   6.9 mmHg. The mean mitral valve  gradient is 3.0 mmHg.   Tricuspid Valve: The tricuspid valve is grossly normal. Tricuspid valve  regurgitation is moderate.   Aortic Valve: Biprothetic AVR stable mean gradient mild AR no PVL no  details in Epic on size/type of valve. The aortic valve has been  repaired/replaced. Aortic valve regurgitation is mild. Aortic  regurgitation PHT measures 787 msec. Aortic valve mean  gradient measures 16.0 mmHg. Aortic valve peak gradient measures 29.7  mmHg. Aortic valve area, by VTI measures 1.42 cm. There is a  bioprosthetic valve present in the aortic position.   Pulmonic Valve: The pulmonic valve was grossly normal. Pulmonic valve  regurgitation is trivial.   Aorta: The aortic root is normal in size and structure.   Venous: The inferior vena cava is dilated in size with greater than 50%  respiratory variability, suggesting right atrial pressure of 8 mmHg.   IAS/Shunts: No atrial level shunt detected by color flow Doppler.      Assessment & Plan   1.  Acute on chronic diastolic CHF-euvolemic.  Well compensated.  Weight today***.  Recent admission for bilateral pleural effusion versus worsening shortness of breath and felt to be secondary to acute on chronic diastolic CHF.  He received diuresis and his discharge weight was noted to be 152 pounds.  Chest x-ray showed mild central vascular congestion with small bilateral pleural effusions. Heart healthy low-sodium diet Continue current medical therapy Increase physical activity as tolerated  Shortness of breath, DOE-breathing much better***with diuresis.  Was noted to have initial bilateral large pleural effusions.  Subsequent chest x-ray showed improvement in pleural effusion. Continue furosemide , potassium Order BMP Following with pulmonology  Syncope, fall-denies further episodes of lightheadedness, presyncope or syncope.  He reports falls at home prior to recent admission.  His amlodipine  was discontinued in the setting of  orthostatic hypotension.  He denies loss of consciousness.  CT scans did not show acute abnormalities. Increase physical activity as tolerated-continue PT Allow time before ambulation/movement in the setting of lightheadedness. Continue scopolamine   Essential hypertension-BP today 108/5***8.  Amlodipine  discontinued during recent admission. Maintain blood pressure log Heart healthy low-sodium diet  Atrial fibrillation-CHA2DS2-VASc score 5.  Continues to deny***bleeding issues.  Reports compliance with warfarin.  INR goal 2-3.  Reviewed Watchman device.   Continue warfarin  Aortic stenosis-status post AVR.  Echocardiogram 04/20/2024 showed bioprosthetic AVR with stable mean gradient and mild AR no PVL. Continue SBE prophylaxis  Disposition: Follow-up with Dr. Pietro or me in 3-4 months.   Jared HERO. Kaeo Jacome NP-C     08/12/2024, 8:41 AM [facility] [address] [place], [place] [address] Office [phone]      I spent 14*** minutes examining this patient, reviewing medications, and using patient centered shared decision making involving their cardiac care.   I spent  20 minutes reviewing past medical history,  medications, and prior cardiac tests.

## 2024-08-12 NOTE — Progress Notes (Signed)
 PHARMACY - ANTICOAGULATION CONSULT NOTE  Pharmacy Consult for warfarin Indication: hx atrial fibrillation  Allergies[1]  Patient Measurements: Height: 5' 6 (167.6 cm) Weight: 69.1 kg (152 lb 5.4 oz) IBW/kg (Calculated) : 63.8  Vital Signs: Temp: 98.3 F (36.8 C) (12/16 0624) Temp Source: Oral (12/16 0624) BP: 120/67 (12/16 0624) Pulse Rate: 66 (12/16 0624)  Labs: Recent Labs    08/10/24 0552 08/11/24 0420 08/12/24 0520  HGB 10.7*  --   --   HCT 32.8*  --   --   PLT 186  --   --   LABPROT 24.5* 24.1* 23.8*  INR 2.1* 2.0* 2.0*  CREATININE 0.98 1.00 1.19    Estimated Creatinine Clearance: 40.2 mL/min (by C-G formula based on SCr of 1.19 mg/dL).   Medical History: Past Medical History:  Diagnosis Date   Aortic stenosis    Atrial fibrillation (HCC)    Baker cyst    Left knee   DJD (degenerative joint disease)    knees   Dysrhythmia    Eczema    Erectile dysfunction    Gout    History of hiatal hernia    History of radiation therapy    Right lung- 04/21/21-05/03/21 Dr. Lynwood Nasuti   History of repair of thoracic aortic aneurysm 2006   HTN (hypertension)    Mild mitral regurgitation by prior echocardiogram    Moderate tricuspid regurgitation by prior echocardiogram    MVP (mitral valve prolapse)    Shortness of breath dyspnea    with activity    Medications:  - PTA warfarin regimen: 2.5 mg daily except for 3.75 mg on Fri with INR goal 2-2.5 (per [facility] clinic note on 12/2)  Assessment: Patient is an 88 y.o M with hx afib on warfarin PTA who presented to the ED on 08/08/24 for evaluation s/p falls and c/o SOB. Warfarin resumed on admission.  Today, 08/12/2024: - INR remains therapeutic at 2.0 - CBC on 12/14: hgb down 10.7, plts 186K - no bleeding documented  - no significant drug-drug intxns noted - ate 100% of meal 12/15  Goal of Therapy:  INR 2-2.5 Monitor platelets by anticoagulation protocol: Yes   Plan:  - Warfarin 2.5 mg PO x1 today - daily  INR - monitor for s/sx bleeding    [name], PharmD Clinical Pharmacist  12/16/20257:51 AM    [1] No Known Allergies

## 2024-08-12 NOTE — Discharge Summary (Addendum)
 Physician Discharge Summary   Patient: [name] MRN: [id number] DOB: 09-10-1935  Admit date:     08/08/2024  Discharge date: 08/12/2024  Discharge Physician: Owen DELENA Lore   PCP: Theophilus Andrews, Tully GRADE, MD   Recommendations at discharge:    Needs close follow up with cardiology, further adjustment of diuretics as needed.  Needs to remain on protonix  while on prednisone .  Of note Amlodipine  discontinue due to orthostatic hypotension.  Continue to monitor INR level.   Discharge Diagnoses: Principal Problem:   CHF exacerbation (HCC) Active Problems:   Essential hypertension   Aortic valve disorder   Chronic atrial fibrillation (HCC)   Anxiety   S/P AVR (aortic valve replacement)   Chronic diastolic CHF (congestive heart failure) (HCC)   Non-small cell lung cancer (HCC)   Centrilobular emphysema (HCC)  Resolved Problems:   * No resolved hospital problems. *  Hospital Course: 88 year old with past medical history significant for aortic stenosis status post TAVR on warfarin, chronic A-fib, history of thoracic aortic aneurysmal repair, hypertension, mitral valve prolapse, presents complaining of shortness of breath, fall and dizziness. Patient had a fall at home, normally uses a walker but has been having dizziness and shortness of breath and weakness. In the ED he was noted to have leukocytosis, white count 12.1, hemoglobin 12.5, elevated AST 93, ALT 68. UA negative for infection. proBNP elevated. INR 1.3. Chest x-ray basilar atelectasis. CT cervical spine no traumatic abnormality. CT head no acute intracranial abnormality. CT chest with no acute rib fractures, moderate to large bilateral pleural effusion with associated atelectasis and emphysema. Patient was admitted for heart failure exacerbation.   Assessment and Plan: 1-Acute on chronic diastolic heart failure - Patient presented with worsening shortness of breath, bilateral pleural effusion.  Elevated BNP 31,007 -  Treated  with 40 mg IV Lasix  BID> - Strict I's and O - Weight : 154 ----152--152 -Chest x ray: Cardiomegaly with mild central vascular congestion and small bilateral pleural effusions. X ray looks better, pleural effusion smaller. Plan to transition to oral diuretics today.  Discharge on 40 mg daily lasix .  Stable for discharge  Needs follow up with cardiology   Multiple falls at home - Multiple falls at home and generalized weakness.  Also reports some dizziness. PT OT eval Will benefit from rehab.    Orthostatic Hypotension. Stop Norvasc .    History of AVR - On chronic warfarin.  Continue per pharmacy   History of non-small cell lung cancer In remission.   Essential hypertension Will hold amlodipine   due to orthostatic hypotension.  Monitor blood pressure while on diuretic   History of COPD Continue as needed albuterol    DVT prophylaxis: Coumadin    Hypokalemia: Replete orally                Consultants: none Procedures performed: none Disposition: Skilled nursing facility Diet recommendation:  Cardiac diet DISCHARGE MEDICATION: Allergies as of 08/12/2024   No Known Allergies      Medication List     STOP taking these medications    amLODipine  5 MG tablet Commonly known as: NORVASC        TAKE these medications    albuterol  (2.5 MG/3ML) 0.083% nebulizer solution Commonly known as: PROVENTIL  Inhale 3 mLs into the lungs every 4 (four) hours as needed for wheezing or shortness of breath.   allopurinol  300 MG tablet Commonly known as: ZYLOPRIM  Take 1 tablet by mouth once daily   cyanocobalamin  500 MCG tablet Commonly known as: VITAMIN B12  Take 1 tablet (500 mcg total) by mouth daily.   feeding supplement Liqd Take 237 mLs by mouth 2 (two) times daily between meals. What changed:  when to take this reasons to take this   furosemide  40 MG tablet Commonly known as: LASIX  Take 1 tablet (40 mg total) by mouth daily. Start taking on:  August 13, 2024 What changed:  medication strength how much to take   guaiFENesin  600 MG 12 hr tablet Commonly known as: MUCINEX  Take 1 tablet (600 mg total) by mouth 2 (two) times daily.   pantoprazole  40 MG tablet Commonly known as: PROTONIX  Take 1 tablet (40 mg total) by mouth daily. Start taking on: August 13, 2024   potassium chloride  SA 20 MEQ tablet Commonly known as: KLOR-CON  M Take 1 tablet by mouth once daily   predniSONE  20 MG tablet Commonly known as: DELTASONE  Take 1 tablet by mouth once daily with breakfast   tamsulosin  0.4 MG Caps capsule Commonly known as: FLOMAX  Take 1 capsule (0.4 mg total) by mouth daily after supper. What changed: when to take this   traMADol  50 MG tablet Commonly known as: ULTRAM  Take 1 tablet (50 mg total) by mouth every 12 (twelve) hours as needed. What changed:  when to take this reasons to take this additional instructions   traZODone  50 MG tablet Commonly known as: DESYREL  Take 1-2 tablets (50-100 mg total) by mouth at bedtime.   Vitamin D3 125 MCG (5000 UT) Caps Take 5,000 Units by mouth daily after breakfast.   warfarin 2.5 MG tablet Commonly known as: COUMADIN  Take as directed. If you are unsure how to take this medication, talk to your nurse or doctor. Original instructions: TAKE 1 TABLET BY MOUTH DAILY EXCEPT TAKE 1 1/2 TABLET ON FRIDAY OR AS DIRECTED BY ANTICOAGULATION CLINIC What changed:  how much to take how to take this when to take this additional instructions        Follow-up Information     Theophilus Andrews, Tully GRADE, MD Follow up in 2 week(s).   Specialty: Internal Medicine Contact information: [address] [place] [address] [phone]         Emelia Josefa HERO, NP Follow up in 1 week(s).   Specialty: Cardiology Why: keep appointment on friday with cardiology Contact information: [address] [place] [address] [phone]                Discharge  Exam: Fredricka Weights   08/10/24 0425 08/11/24 0517 08/12/24 0500  Weight: 70 kg 69.1 kg 69.1 kg   General; NAD  Condition at discharge: stable  The results of significant diagnostics from this hospitalization (including imaging, microbiology, ancillary and laboratory) are listed below for reference.   Imaging Studies: DG CHEST PORT 1 VIEW Result Date: 08/11/2024 CLINICAL DATA:  Pleural effusion. EXAM: PORTABLE CHEST 1 VIEW COMPARISON:  Chest radiograph dated 08/08/2024. FINDINGS: Small bilateral pleural effusions. No consolidative changes or pneumothorax. Cardiomegaly with mild central vascular congestion. Atherosclerotic calcification of the aorta. Median sternotomy wires. No acute osseous pathology. Degenerative changes of the spine and shoulders. IMPRESSION: Cardiomegaly with mild central vascular congestion and small bilateral pleural effusions. Electronically Signed   By: Vanetta Chou M.D.   On: 08/11/2024 08:39   CT Chest Wo Contrast Result Date: 08/08/2024 EXAM: CT CHEST WITHOUT CONTRAST 08/08/2024 03:29:53 PM TECHNIQUE: CT of the chest was performed without the administration of intravenous contrast. Multiplanar reformatted images are provided for review. Automated exposure control, iterative reconstruction, and/or weight  based adjustment of the mA/kV was utilized to reduce the radiation dose to as low as reasonably achievable. COMPARISON: 06/02/2024 CLINICAL HISTORY: Evaluation for rib fractures right side as well as infiltrate. FINDINGS: MEDIASTINUM: Thoracic aortic, coronary artery, and branch vessel atheromatous vascular calcifications. Substantial cardiomegaly. Aortic valve prosthesis. Mitral valve calcifications. Prominent main pulmonary artery suggests pulmonary arterial hypertension. The central airways are clear. LYMPH NODES: Scattered small mediastinal lymph nodes. Right hilar node 1.4 cm in short axis on image 73 series 504, previously the same. No axillary lymphadenopathy.  LUNGS AND PLEURA: Moderate to large bilateral pleural effusions with associated passive atelectasis, increased in volume from prior. Stable bandlike right apical scarring anteriorly with marginal fiducials. Emphysema. Airway thickening suggest bronchitis or reactive airways disease. Mild atelectasis posteriorly in the left upper lobe along the major fissure. Left upper lobe subsolid nodule noted on the prior exam is not readily visible on todays exam although is in a region of motion artifact. No pneumothorax. SOFT TISSUES/BONES: Severe degenerative shoulder arthropathy noted. No rib fracture identified. Thoracic spondylosis with multilevel bridging osteophytes. Motion artifact is present, reducing diagnostic sensitivity and specificity. UPPER ABDOMEN: Limited images of the upper abdomen demonstrates abdominal aortic atherosclerosis. IMPRESSION: 1. No acute rib fracture identified. 2. Moderate to large bilateral pleural effusions with associated passive atelectasis, increased from prior. 3. Emphysema with airway thickening consistent with bronchitis or reactive airways disease. 4. Substantial cardiomegaly with aortic valve prosthesis, mitral valve calcifications, and prominent main pulmonary artery suggesting pulmonary arterial hypertension. 5. Mild left upper lobe atelectasis along the major fissure. 6. Severe degenerative shoulder arthropathy and thoracic spondylosis with multilevel bridging osteophytes. 7. Stable irregular bandlike opacity at the right lung apex with adjacent fiducials. Electronically signed by: Ryan Salvage MD 08/08/2024 04:01 PM EST RP Workstation: [id number]   DG Chest Portable 1 View Result Date: 08/08/2024 CLINICAL DATA:  Shortness of breath EXAM: PORTABLE CHEST 1 VIEW COMPARISON:  April 20, 2024 FINDINGS: Stable cardiomegaly. Status post aortic valve repair. Minimal bibasilar subsegmental atelectasis. Degenerative changes are seen involving both shoulder joints. IMPRESSION:  Minimal bibasilar subsegmental atelectasis. Electronically Signed   By: Lynwood Landy Raddle M.D.   On: 08/08/2024 15:52   CT Cervical Spine Wo Contrast Result Date: 08/08/2024 EXAM: CT CERVICAL SPINE WITHOUT CONTRAST 08/08/2024 03:29:53 PM TECHNIQUE: CT of the cervical spine was performed without the administration of intravenous contrast. Multiplanar reformatted images are provided for review. Automated exposure control, iterative reconstruction, and/or weight based adjustment of the mA/kV was utilized to reduce the radiation dose to as low as reasonably achievable. COMPARISON: 518 2025. CLINICAL HISTORY: Polytrauma, blunt. FINDINGS: CERVICAL SPINE: BONES AND ALIGNMENT: Similar alignment of the cervical spine. Trace degenerative retrolisthesis of C3 on C4 as well as trace degenerative anterolisthesis of C4 on C5 and C7 on T1 are unchanged. No evidence of traumatic malalignment. Similar alignment at the atlantoaxial articulations. Findings suggestive of mild basilar infarct. Degenerative soft tissue mineralization along the dorsal aspect of the dens slightly indenting the ventral thecal sac. DEGENERATIVE CHANGES: Severe disc space narrowing at C3-C4, C4-C5, C5-C6, and C6-C7. Disc osteophyte complexes at multiple levels. 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 Mild spinal canal stenosis at C3-C4, C5-C6, and C6-C7. There is no evidence of high grade osseous spinal canal stenosis. Arthrosis and uncovertebral hypertrophy with foraminal stenosis at multiple levels throughout the cervical spine, overall most pronounced at C3-C4 and C6-C7. SOFT TISSUES: No prevertebral soft tissue swelling. LUNGS AND PLEURA: Partially visualized bilateral pleural  effusions. Scarring in the right lung apex. Emphysema. IMPRESSION: 1. No acute traumatic abnormality of the cervical spine. 2. Severe multilevel degenerative changes as above. 3. Partially visualized bilateral  pleural effusions. Electronically signed by: Donnice Mania MD 08/08/2024 03:44 PM EST RP Workstation: [id number]   CT Head Wo Contrast Result Date: 08/08/2024 EXAM: CT HEAD WITHOUT 08/08/2024 03:29:53 PM TECHNIQUE: CT of the head was performed without the administration of intravenous contrast. Automated exposure control, iterative reconstruction, and/or weight based adjustment of the mA/kV was utilized to reduce the radiation dose to as low as reasonably achievable. COMPARISON: 04/19/2024 CLINICAL HISTORY: Polytrauma, blunt FINDINGS: BRAIN AND VENTRICLES: No acute intracranial hemorrhage. No mass effect or midline shift. No extra-axial fluid collection. Similar appearance of mild parenchymal volume loss. Similar moderate chronic microvascular ischemic changes. 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 Atherosclerosis of the carotid siphons and intracranial vertebral arteries similar to prior. No hydrocephalus. ORBITS: Bilateral lens replacement similar to prior. SINUSES AND MASTOIDS: No acute abnormality. SOFT TISSUES AND SKULL: No acute skull fracture. Similar appearance of calcified atherosclerosis of multiple scalp vessels. IMPRESSION: 1. No acute intracranial abnormality. 2. Mild parenchymal volume loss and moderate chronic microvascular ischemic changes, unchanged from prior. 3. 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 Electronically signed by: Donnice Mania MD 08/08/2024 03:37 PM EST RP Workstation: [id number]    Microbiology: Results for orders placed or performed during the hospital encounter of 11/14/21  Fungus Culture With Stain     Status: None   Collection Time: 11/14/21  7:30 AM   Specimen: Bronchial Alveolar Lavage; Respiratory  Result Value Ref Range Status   Fungus Stain Final report  Final   Fungus (Mycology) Culture Final report  Final    Comment: (NOTE) Performed At: [facility] [address] [place],  [place] [id number] Jennette Shorter MD Ey:[id number]    Fungal Source BRONCHIAL ALVEOLAR LAVAGE  Final    Comment: Performed at [facility], [address] [place], [place] [address]  Culture, Respiratory w Gram Stain     Status: None   Collection Time: 11/14/21  7:30 AM   Specimen: Bronchial Alveolar Lavage; Respiratory  Result Value Ref Range Status   Specimen Description BRONCHIAL ALVEOLAR LAVAGE  Final   Special Requests NONE  Final   Gram Stain   Final    FEW WBC PRESENT,BOTH PMN AND MONONUCLEAR NO ORGANISMS SEEN    Culture   Final    NO GROWTH 2 DAYS Performed at [facility] Lab, [address] [place], [place] [address]    Report Status 11/16/2021 FINAL  Final  Aerobic/Anaerobic Culture w Gram Stain (surgical/deep wound)     Status: None   Collection Time: 11/14/21  7:30 AM   Specimen: Bronchial Alveolar Lavage; Respiratory  Result Value Ref Range Status   Specimen Description BRONCHIAL ALVEOLAR LAVAGE  Final   Special Requests NONE  Final   Gram Stain NO WBC SEEN NO ORGANISMS SEEN   Final   Culture   Final    No growth aerobically or anaerobically. Performed at [facility] Lab, [address] [place], [place] [address]    Report Status 11/19/2021 FINAL  Final  Acid Fast Culture with reflexed sensitivities     Status: None   Collection Time: 11/14/21  7:30 AM   Specimen: Bronchial Alveolar Lavage; Respiratory  Result Value Ref Range Status   Acid Fast Culture Negative  Final  Comment: (NOTE) No acid fast bacilli isolated after 6 weeks. Performed At: [facility] [address] [place], [place] [id number] Jennette Shorter MD Ey:[id number]    Source of Sample BRONCHIAL ALVEOLAR LAVAGE  Final    Comment: Performed at [facility], [address], [place], [place] [address]  Acid Fast Smear (AFB)     Status: None   Collection Time: 11/14/21  7:30 AM   Specimen: Bronchial Alveolar Lavage; Respiratory  Result Value Ref Range Status   AFB  Specimen Processing Concentration  Final   Acid Fast Smear Negative  Final    Comment: (NOTE) Performed At: [facility] [address] [place], [place] [id number] Jennette Shorter MD Ey:[id number]    Source (AFB) BRONCHIAL ALVEOLAR LAVAGE  Final    Comment: Performed at [facility] Lab, [address], [place], [place] [address]  Fungus Culture With Stain     Status: None   Collection Time: 11/14/21  7:30 AM   Specimen: Bronchial Alveolar Lavage; Respiratory  Result Value Ref Range Status   Fungus Stain Final report  Final   Fungus (Mycology) Culture Final report  Final    Comment: (NOTE) Performed At: [facility] [address] [place], [place] [id number] Jennette Shorter MD Ey:[id number]    Fungal Source BRONCHIAL ALVEOLAR LAVAGE  Final    Comment: Performed at [facility] Lab, [address], [place], [place] [address]  Culture, Respiratory w Gram Stain     Status: None   Collection Time: 11/14/21  7:30 AM   Specimen: Bronchial Alveolar Lavage; Respiratory  Result Value Ref Range Status   Specimen Description BRONCHIAL ALVEOLAR LAVAGE  Final   Special Requests NONE  Final   Gram Stain   Final    RARE WBC PRESENT, PREDOMINANTLY MONONUCLEAR NO ORGANISMS SEEN    Culture   Final    RARE Normal respiratory flora-no Staph aureus or Pseudomonas seen Performed at [facility] Lab, [address] [place], [place] [address]    Report Status 11/16/2021 FINAL  Final  Aerobic/Anaerobic Culture w Gram Stain (surgical/deep wound)     Status: None   Collection Time: 11/14/21  7:30 AM   Specimen: Bronchial Alveolar Lavage; Respiratory  Result Value Ref Range Status   Specimen Description BRONCHIAL ALVEOLAR LAVAGE  Final   Special Requests NONE  Final   Gram Stain   Final    RARE WBC PRESENT, PREDOMINANTLY MONONUCLEAR NO ORGANISMS SEEN    Culture   Final    RARE Normal respiratory flora-no Staph aureus or Pseudomonas seen NO ANAEROBES ISOLATED Performed at  [facility] Lab, [address], [place], [place] [address]    Report Status 11/19/2021 FINAL  Final  Acid Fast Culture with reflexed sensitivities     Status: None   Collection Time: 11/14/21  7:30 AM   Specimen: Bronchial Alveolar Lavage; Respiratory  Result Value Ref Range Status   Acid Fast Culture Negative  Final    Comment: (NOTE) No acid fast bacilli isolated after 6 weeks. Performed At: [facility] [address] [place], [place] [id number] Jennette Shorter MD Ey:[id number]    Source of Sample BRONCHIAL ALVEOLAR LAVAGE  Final    Comment: Performed at [facility] Lab, [address] [place], [place] [address]  Acid Fast Smear (AFB)     Status: None   Collection Time: 11/14/21  7:30 AM   Specimen: Bronchial Alveolar Lavage; Respiratory  Result Value Ref Range Status   AFB  Specimen Processing Concentration  Final   Acid Fast Smear Negative  Final    Comment: (NOTE) Performed At: [facility] [address] [place], [place] [id number] Jennette Shorter MD Ey:[id number]    Source (AFB) BRONCHIAL ALVEOLAR LAVAGE  Final    Comment: Performed at [facility] Lab, [address] [place], [place] [address]  Fungus Culture With Stain     Status: None   Collection Time: 11/14/21  7:30 AM   Specimen: Bronchial Alveolar Lavage; Respiratory  Result Value Ref Range Status   Fungus Stain Final report  Final   Fungus (Mycology) Culture Final report  Final    Comment: (NOTE) Performed At: [facility] [address] [place], [place] [id number] Jennette Shorter MD Ey:[id number]    Fungal Source BRONCHIAL ALVEOLAR LAVAGE  Final    Comment: Performed at [facility] Lab, [address] [place], [place] [address]  Culture, Respiratory w Gram Stain     Status: None   Collection Time: 11/14/21  7:30 AM   Specimen: Bronchial Alveolar Lavage; Respiratory  Result Value Ref Range Status   Specimen Description BRONCHIAL ALVEOLAR LAVAGE  Final   Special Requests  NONE  Final   Gram Stain   Final    RARE WBC PRESENT, PREDOMINANTLY MONONUCLEAR NO ORGANISMS SEEN    Culture   Final    RARE Normal respiratory flora-no Staph aureus or Pseudomonas seen Performed at [facility] Lab, [address] [place], [place] [address]    Report Status 11/17/2021 FINAL  Final  Aerobic/Anaerobic Culture w Gram Stain (surgical/deep wound)     Status: None   Collection Time: 11/14/21  7:30 AM   Specimen: Bronchial Alveolar Lavage; Respiratory  Result Value Ref Range Status   Specimen Description BRONCHIAL ALVEOLAR LAVAGE  Final   Special Requests NONE  Final   Gram Stain   Final    FEW WBC PRESENT, PREDOMINANTLY MONONUCLEAR NO ORGANISMS SEEN    Culture   Final    RARE Normal respiratory flora-no Staph aureus or Pseudomonas seen NO ANAEROBES ISOLATED Performed at [facility] Lab, [address], [place], [place] [address]    Report Status 11/19/2021 FINAL  Final  Acid Fast Culture with reflexed sensitivities     Status: None   Collection Time: 11/14/21  7:30 AM   Specimen: Bronchial Alveolar Lavage; Respiratory  Result Value Ref Range Status   Acid Fast Culture Negative  Final    Comment: (NOTE) No acid fast bacilli isolated after 6 weeks. Performed At: [facility] [address] [place], [place] [id number] Jennette Shorter MD Ey:[id number]    Source of Sample BRONCHIAL ALVEOLAR LAVAGE  Final    Comment: Performed at [facility] Lab, [address] [place], [place] [address]  Acid Fast Smear (AFB)     Status: None   Collection Time: 11/14/21  7:30 AM   Specimen: Bronchial Alveolar Lavage; Respiratory  Result Value Ref Range Status   AFB Specimen Processing Concentration  Final   Acid Fast Smear Negative  Final    Comment: (NOTE) Performed At: [facility] [address] [place], [place] [id number] Jennette Shorter MD Ey:[id number]    Source (AFB) BRONCHIAL ALVEOLAR LAVAGE  Final    Comment: Performed at [facility]  Lab, [address] [place], [place] [address]  Fungus Culture Result     Status: None   Collection Time: 11/14/21  7:30 AM  Result Value Ref Range Status   Result 1  Comment  Final    Comment: (NOTE) KOH/Calcofluor preparation:  no fungus observed. Performed At: [facility] [address] [place], [place] [id number] Jennette Shorter MD Ey:[id number]   Fungus Culture Result     Status: None   Collection Time: 11/14/21  7:30 AM  Result Value Ref Range Status   Result 1 Comment  Final    Comment: (NOTE) KOH/Calcofluor preparation:  no fungus observed. Performed At: [facility] [address] [place], [place] [id number] Jennette Shorter MD Ey:[id number]   Fungus Culture Result     Status: None   Collection Time: 11/14/21  7:30 AM  Result Value Ref Range Status   Result 1 Comment  Final    Comment: (NOTE) KOH/Calcofluor preparation:  no fungus observed. Performed At: [facility] [address] [place], [place] [id number] Jennette Shorter MD Ey:[id number]   Fungal organism reflex     Status: None   Collection Time: 11/14/21  7:30 AM  Result Value Ref Range Status   Fungal result 1 Comment  Final    Comment: (NOTE) No yeast or mold isolated after 4 weeks. Performed At: [facility] [address] [place], [place] [id number] Jennette Shorter MD Ey:[id number]   Fungal organism reflex     Status: None   Collection Time: 11/14/21  7:30 AM  Result Value Ref Range Status   Fungal result 1 Comment  Final    Comment: (NOTE) No yeast or mold isolated after 4 weeks. Performed At: [facility] [address] [place], [place] [id number] Jennette Shorter MD Ey:[id number]   Fungal organism reflex     Status: None   Collection Time: 11/14/21  7:30 AM  Result Value Ref Range Status   Fungal result 1 Comment  Final    Comment: (NOTE) No yeast or mold isolated after 4 weeks. Performed At: [facility] [address] [place], [place]  [id number] Jennette Shorter MD Ey:[id number]     Labs: CBC: Recent Labs  Lab 08/08/24 1316 08/10/24 0552  WBC 12.1* 8.5  HGB 12.5* 10.7*  HCT 39.3 32.8*  MCV 96.3 94.8  PLT 197 186   Basic Metabolic Panel: Recent Labs  Lab 08/08/24 1316 08/09/24 0527 08/10/24 0552 08/11/24 0420 08/12/24 0520  NA 140 144 140 139 140  K 3.8 3.6 3.1* 3.3* 3.6  CL 101 106 102 101 101  CO2 25 28 30 31  32  GLUCOSE 102* 91 97 110* 96  BUN 22 21 23 22  25*  CREATININE 1.00 0.99 0.98 1.00 1.19  CALCIUM 9.2 8.8* 8.4* 8.6* 8.8*  MG  --   --  2.1  --   --    Liver Function Tests: Recent Labs  Lab 08/08/24 1316  AST 93*  ALT 60*  ALKPHOS 100  BILITOT 1.0  PROT 6.8  ALBUMIN  3.8   CBG: No results for input(s): GLUCAP in the last 168 hours.  Discharge time spent: greater than 30 minutes.  Signed: Owen DELENA Lore, MD [facility] 08/12/2024

## 2024-08-12 NOTE — TOC Progression Note (Signed)
 Transition of Care [facility]) - Progression Note    Patient Details  Name: [name] MRN: [id number] Date of Birth: 1936/01/25  [name]) CM/SW Contact  Ranada Vigorito, Nathanel, RN Phone Number: 08/12/2024, 2:26 PM  Clinical Narrative: PT recc ST SNF-faxed out await choices,then auth. Bed delay placed since d/c order in place.     Expected Discharge Plan: Skilled Nursing Facility Barriers to Discharge:  (faxed out await choices)               Expected Discharge Plan and Services   Discharge Planning Services: CM Consult Post Acute Care Choice: Skilled Nursing Facility Living arrangements for the past 2 months: Single Family Home Expected Discharge Date: 08/12/24                                     Social Drivers of Health (SDOH) Interventions SDOH Screenings   Food Insecurity: No Food Insecurity (08/09/2024)  Housing: Low Risk (08/09/2024)  Transportation Needs: No Transportation Needs (08/09/2024)  Utilities: Not At Risk (08/09/2024)  Alcohol Screen: Low Risk (10/30/2023)  Depression (PHQ2-9): Low Risk (05/15/2024)  Financial Resource Strain: Low Risk (10/30/2023)  Physical Activity: Insufficiently Active (10/30/2023)  Social Connections: Moderately Isolated (08/09/2024)  Stress: No Stress Concern Present (10/30/2023)  Tobacco Use: Medium Risk (08/10/2024)  Health Literacy: Adequate Health Literacy (10/30/2023)    Readmission Risk Interventions    08/11/2024   10:13 AM  Readmission Risk Prevention Plan  PCP or Specialist Appt within 3-5 Days Complete  HRI or Home Care Consult Complete  Social Work Consult for Recovery Care Planning/Counseling Complete  Palliative Care Screening Complete  Medication Review Oceanographer) Complete

## 2024-08-12 NOTE — Progress Notes (Signed)
 Physical Therapy Treatment Patient Details Name: [name] MRN: [id number] DOB: 05/08/1936 Today's Date: 08/12/2024   History of Present Illness 88 year old presents complaining of shortness of breath, fall and dizziness on 08/08/24.  CT chest with no acute rib fractures, moderate to large bilateral pleural effusion with associated atelectasis and emphysema.   Admitted for heart failure exacerbation, has had orthostatic hypotension.  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  Pt able to ambulate 80' x 2 with rest break but did require min A at times - high fall risk.  Pt tolerating modified Epley maneuver well but still with rotational nystagmus and dizziness -continues to need further treatments.   Recommend Patient will benefit from continued inpatient follow up therapy, <3 hours/day at d/c and pt will likely need further vestibular PT f/u either at SNF or outpt PT.   If plan is discharge home, recommend the following: A little help with walking and/or transfers;A little help with bathing/dressing/bathroom;Assist for transportation;Help with stairs or ramp for entrance;Assistance with cooking/housework   Can travel by private vehicle     Yes  Equipment Recommendations  None recommended by PT    Recommendations for Other Services       Precautions / Restrictions Precautions Precautions: Fall     Mobility  Bed Mobility Overal bed mobility: Needs Assistance Bed Mobility: Supine to Sit, Sit to Supine     Supine to sit: Min assist Sit to supine: Min assist   General bed mobility comments: Assist for legs back to bed and trunk to sit    Transfers Overall transfer level: Needs assistance Equipment used:  Rolling walker (2 wheels) Transfers: Sit to/from Stand Sit to Stand: Min assist           General transfer comment: STS x 2; multiple cues for hand placement; min A to steady and needed use of momentum    Ambulation/Gait Ambulation/Gait assistance: Min assist Gait Distance (Feet): 80 Feet (80'x2) Assistive device: Rolling walker (2 wheels) Gait Pattern/deviations: Step-to pattern, Decreased stride length, Trunk flexed Gait velocity: decreased     General Gait Details: cues for posture; ambulated 80'x2 wtih supported rest break; requiring min A with turns to prevent falls-otherwise CGA   Stairs             Wheelchair Mobility     Tilt Bed    Modified Rankin (Stroke Patients Only)       Balance Overall balance assessment: Needs assistance, History of Falls Sitting-balance support: Feet supported, No upper extremity supported Sitting balance-Leahy Scale: Good     Standing balance support: Reliant on assistive device for balance, Bilateral upper extremity supported Standing balance-Leahy Scale: Poor Standing balance comment: reliant on device                            Communication    Cognition Arousal: Alert Behavior During Therapy: WFL for tasks assessed/performed   PT - Cognitive impairments: No apparent impairments                                Cueing    Exercises      General Comments General comments (skin  integrity, edema, etc.): Pt reports dizziness/lightheadedness has improved with mobility , now only has with head turns and when lying down.  Performed Epley maneuvar for L, pt with rotational nystagmus lasting <10 sec in initial position, held each position 90 sec.  Cues for keeping head still and used be featured for modified positions due to limited neck ROM. Pt tolerating well.      Pertinent Vitals/Pain Pain Assessment Pain Assessment: No/denies pain    Home Living                          Prior  Function            PT Goals (current goals can now be found in the care plan section) Progress towards PT goals: Progressing toward goals    Frequency    Min 2X/week      PT Plan      Co-evaluation              AM-PAC PT 6 Clicks Mobility   Outcome Measure  Help needed turning from your back to your side while in a flat bed without using bedrails?: A Little Help needed moving from lying on your back to sitting on the side of a flat bed without using bedrails?: A Lot Help needed moving to and from a bed to a chair (including a wheelchair)?: A Little Help needed standing up from a chair using your arms (e.g., wheelchair or bedside chair)?: A Little Help needed to walk in hospital room?: A Little Help needed climbing 3-5 steps with a railing? : A Lot 6 Click Score: 16    End of Session Equipment Utilized During Treatment: Gait belt Activity Tolerance: Patient tolerated treatment well Patient left: with chair alarm set;in chair;with call bell/phone within reach Nurse Communication: Mobility status PT Visit Diagnosis: Other abnormalities of gait and mobility (R26.89);Dizziness and giddiness (R42)     Time: 8455-8391 PT Time Calculation (min) (ACUTE ONLY): 24 min  Charges:    $Gait Training: 8-22 mins $Canalith Rep Proc: 8-22 mins PT General Charges $$ ACUTE PT VISIT: 1 Visit                     Benjiman, PT Acute Rehab Services [facility] [phone]    Benjiman VEAR Mulberry 08/12/2024, 4:33 PM

## 2024-08-13 DIAGNOSIS — J90 Pleural effusion, not elsewhere classified: Secondary | ICD-10-CM | POA: Diagnosis not present

## 2024-08-13 LAB — PROTIME-INR
INR: 2.1 — ABNORMAL HIGH (ref 0.8–1.2)
Prothrombin Time: 24.2 s — ABNORMAL HIGH (ref 11.4–15.2)

## 2024-08-13 MED ORDER — WARFARIN SODIUM 2.5 MG PO TABS
2.5000 mg | ORAL_TABLET | Freq: Once | ORAL | Status: AC
  Administered 2024-08-13: 16:00:00 2.5 mg via ORAL
  Filled 2024-08-13: qty 1

## 2024-08-13 MED ADMIN — Potassium Chloride Microencapsulated Crys ER Tab 20 mEq: 20 meq | ORAL | @ 09:00:00 | NDC 70010013501

## 2024-08-13 NOTE — Plan of Care (Signed)
  Problem: Education: Goal: Knowledge of General Education information will improve Description: Including pain rating scale, medication(s)/side effects and non-pharmacologic comfort measures Outcome: Progressing   Problem: Clinical Measurements: Goal: Ability to maintain clinical measurements within normal limits will improve Outcome: Progressing   Problem: Coping: Goal: Level of anxiety will decrease Outcome: Progressing   

## 2024-08-13 NOTE — Progress Notes (Signed)
 Physical Therapy Treatment Patient Details Name: [name] MRN: [id number] DOB: 1936-01-12 Today's Date: 08/13/2024   History of Present Illness 88 year old presents complaining of shortness of breath, fall and dizziness on 08/08/24.  CT chest with no acute rib fractures, moderate to large bilateral pleural effusion with associated atelectasis and emphysema.   Admitted for heart failure exacerbation, has had orthostatic hypotension.  Past medical history significant for aortic stenosis status post TAVR on warfarin, chronic A-fib, history of thoracic aortic aneurysmal repair, hypertension, mitral valve prolapse,    PT Comments  Pt making gradual improvement.  He had no vertigo today with Hall-Pike Dix test -will continue to monitor for vertigo symptoms.  Pt did ambulate 40'x2 but reports increased knee pain today so focused more on standing balance than gait distance.  Does need min A at times with transfers and for balance to prevent falls.  Pt lives alone continue to recommend Patient will benefit from continued inpatient follow up therapy, <3 hours/day.  Would also continue to recommend further vestibular PT f/u.     If plan is discharge home, recommend the following: A little help with walking and/or transfers;A little help with bathing/dressing/bathroom;Assist for transportation;Help with stairs or ramp for entrance;Assistance with cooking/housework   Can travel by private vehicle     Yes  Equipment Recommendations  None recommended by PT    Recommendations for Other Services       Precautions / Restrictions Precautions Precautions: Fall     Mobility  Bed Mobility Overal bed mobility: Needs Assistance Bed Mobility: Supine to Sit, Sit to Supine     Supine to sit: Min assist Sit to supine: Min assist   General bed mobility comments: Assist for legs back to bed and trunk to sit    Transfers Overall transfer level: Needs assistance Equipment used: Rolling walker (2  wheels) Transfers: Sit to/from Stand Sit to Stand: Min assist           General transfer comment: STS x 2;single cue for hand placement; min A to steady and needed use of momentum    Ambulation/Gait Ambulation/Gait assistance: Min assist, Contact guard assist Gait Distance (Feet): 40 Feet (40'x2) Assistive device: Rolling walker (2 wheels) Gait Pattern/deviations: Step-to pattern, Decreased stride length, Trunk flexed Gait velocity: decreased     General Gait Details: Reports increased knee pain from arthritis today, ambulating 40'x2 with seated rest break; overall CGA with 1 episode of min A to steady   Optometrist     Tilt Bed    Modified Rankin (Stroke Patients Only)       Balance Overall balance assessment: Needs assistance, History of Falls Sitting-balance support: Feet supported, No upper extremity supported Sitting balance-Leahy Scale: Good     Standing balance support: Reliant on assistive device for balance, Bilateral upper extremity supported, No upper extremity supported Standing balance-Leahy Scale: Fair Standing balance comment: RW for mobility ; could stand without UE support               High Level Balance Comments: Performed standing feet apart EO and EC no UE support with supervision for at least 15 sec each.  Progressed to feet together EO and tolerated 15 sec,  feet together EC only 5 sec x 3 with increased swaying.  Also performed reaching/leaning outside BOS to R/L/ and up/down to L and R bil UE x 3reps each direction - pt with limited shoulder ROM  so frequently more leaning than reaching.  Performed sidestepping without UE support 6 steps each direction - min A to steady and limited by knee pain            Communication    Cognition Arousal: Alert Behavior During Therapy: WFL for tasks assessed/performed   PT - Cognitive impairments: No apparent impairments                                 Cueing    Exercises      General Comments General comments (skin integrity, edema, etc.): Reports some dizziness when he first got up but no further symptoms today.  No dizziness or lightheadedness with tranfsers.  Started to perform Epley today but pt asymptomatic with first position on L so held.  Did test hall-pike dix on R and pt also asymptomatic.      Pertinent Vitals/Pain Pain Assessment Pain Assessment: No/denies pain    Home Living                          Prior Function            PT Goals (current goals can now be found in the care plan section) Progress towards PT goals: Progressing toward goals    Frequency    Min 2X/week      PT Plan      Co-evaluation              AM-PAC PT 6 Clicks Mobility   Outcome Measure  Help needed turning from your back to your side while in a flat bed without using bedrails?: A Little Help needed moving from lying on your back to sitting on the side of a flat bed without using bedrails?: A Lot Help needed moving to and from a bed to a chair (including a wheelchair)?: A Little Help needed standing up from a chair using your arms (e.g., wheelchair or bedside chair)?: A Little Help needed to walk in hospital room?: A Little Help needed climbing 3-5 steps with a railing? : A Lot 6 Click Score: 16    End of Session Equipment Utilized During Treatment: Gait belt Activity Tolerance: Patient tolerated treatment well Patient left: with chair alarm set;in chair;with call bell/phone within reach Nurse Communication: Mobility status PT Visit Diagnosis: Other abnormalities of gait and mobility (R26.89);Dizziness and giddiness (R42)     Time: 8652-8591 PT Time Calculation (min) (ACUTE ONLY): 21 min  Charges:    $Neuromuscular Re-education: 8-22 mins PT General Charges $$ ACUTE PT VISIT: 1 Visit                     Benjiman, PT Acute Rehab [facility] [phone]    Benjiman VEAR Mulberry 08/13/2024, 2:17 PM

## 2024-08-13 NOTE — Discharge Summary (Signed)
 Physician Discharge Summary   Patient: [name] MRN: [id number] DOB: July 21, 1936  Admit date:     08/08/2024  Discharge date: 08/13/2024  Discharge Physician: Owen DELENA Lore   PCP: Theophilus Andrews, Tully GRADE, MD   Recommendations at discharge:    Needs close follow up with cardiology, further adjustment of diuretics as needed.  Needs to remain on protonix  while on prednisone .  Of note Amlodipine  discontinue due to orthostatic hypotension.  Continue to monitor INR level.   Discharge Diagnoses: Principal Problem:   CHF exacerbation (HCC) Active Problems:   Essential hypertension   Aortic valve disorder   Chronic atrial fibrillation (HCC)   Anxiety   S/P AVR (aortic valve replacement)   Chronic diastolic CHF (congestive heart failure) (HCC)   Non-small cell lung cancer (HCC)   Centrilobular emphysema (HCC)  Resolved Problems:   * No resolved hospital problems. *  Hospital Course: 88 year old with past medical history significant for aortic stenosis status post TAVR on warfarin, chronic A-fib, history of thoracic aortic aneurysmal repair, hypertension, mitral valve prolapse, presents complaining of shortness of breath, fall and dizziness. Patient had a fall at home, normally uses a walker but has been having dizziness and shortness of breath and weakness. In the ED he was noted to have leukocytosis, white count 12.1, hemoglobin 12.5, elevated AST 93, ALT 68. UA negative for infection. proBNP elevated. INR 1.3. Chest x-ray basilar atelectasis. CT cervical spine no traumatic abnormality. CT head no acute intracranial abnormality. CT chest with no acute rib fractures, moderate to large bilateral pleural effusion with associated atelectasis and emphysema. Patient was admitted for heart failure exacerbation.   Stable for discharge awaiting placemenet.   Assessment and Plan: 1-Acute on chronic diastolic heart failure - Patient presented with worsening shortness of breath,  bilateral pleural effusion.  Elevated BNP 31,007 - Treated  with 40 mg IV Lasix  BID> - Strict I's and O - Weight : 154 ----152--152 -Chest x ray: Cardiomegaly with mild central vascular congestion and small bilateral pleural effusions. X ray looks better, pleural effusion smaller. Plan to transition to oral diuretics today.  Discharge on 40 mg daily lasix .  Needs follow up with cardiology   Multiple falls at home - Multiple falls at home and generalized weakness.  Also reports some dizziness. PT OT eval Will benefit from rehab.    Orthostatic Hypotension. Stop Norvasc .    History of AVR - On chronic warfarin.  Continue per pharmacy   History of non-small cell lung cancer In remission.   Essential hypertension Will hold amlodipine   due to orthostatic hypotension.  Monitor blood pressure while on diuretic   History of COPD Continue as needed albuterol    DVT prophylaxis: Coumadin    Hypokalemia: Replete orally                Consultants: none Procedures performed: none Disposition: Skilled nursing facility Diet recommendation:  Cardiac diet DISCHARGE MEDICATION: Allergies as of 08/13/2024   No Known Allergies      Medication List     STOP taking these medications    amLODipine  5 MG tablet Commonly known as: NORVASC        TAKE these medications    albuterol  (2.5 MG/3ML) 0.083% nebulizer solution Commonly known as: PROVENTIL  Inhale 3 mLs into the lungs every 4 (four) hours as needed for wheezing or shortness of breath.   allopurinol  300 MG tablet Commonly known as: ZYLOPRIM  Take 1 tablet by mouth once daily   cyanocobalamin  500 MCG tablet Commonly known  as: VITAMIN B12 Take 1 tablet (500 mcg total) by mouth daily.   feeding supplement Liqd Take 237 mLs by mouth 2 (two) times daily between meals. What changed:  when to take this reasons to take this   furosemide  40 MG tablet Commonly known as: LASIX  Take 1 tablet (40 mg total) by mouth  daily. What changed:  medication strength how much to take   guaiFENesin  600 MG 12 hr tablet Commonly known as: MUCINEX  Take 1 tablet (600 mg total) by mouth 2 (two) times daily.   pantoprazole  40 MG tablet Commonly known as: PROTONIX  Take 1 tablet (40 mg total) by mouth daily.   potassium chloride  SA 20 MEQ tablet Commonly known as: KLOR-CON  M Take 1 tablet by mouth once daily   predniSONE  20 MG tablet Commonly known as: DELTASONE  Take 1 tablet by mouth once daily with breakfast   tamsulosin  0.4 MG Caps capsule Commonly known as: FLOMAX  Take 1 capsule (0.4 mg total) by mouth daily after supper. What changed: when to take this   traMADol  50 MG tablet Commonly known as: ULTRAM  Take 1 tablet (50 mg total) by mouth every 12 (twelve) hours as needed. What changed:  when to take this reasons to take this additional instructions   traZODone  50 MG tablet Commonly known as: DESYREL  Take 1-2 tablets (50-100 mg total) by mouth at bedtime.   Vitamin D3 125 MCG (5000 UT) Caps Take 5,000 Units by mouth daily after breakfast.   warfarin 2.5 MG tablet Commonly known as: COUMADIN  Take as directed. If you are unsure how to take this medication, talk to your nurse or doctor. Original instructions: TAKE 1 TABLET BY MOUTH DAILY EXCEPT TAKE 1 1/2 TABLET ON FRIDAY OR AS DIRECTED BY ANTICOAGULATION CLINIC What changed:  how much to take how to take this when to take this additional instructions        Follow-up Information     Theophilus Andrews, Tully GRADE, MD Follow up in 2 week(s).   Specialty: Internal Medicine Contact information: [address] [place] [address] [place] [address] [phone]         Emelia Josefa HERO, NP Follow up in 1 week(s).   Specialty: Cardiology Why: keep appointment on friday with cardiology Contact information: [address] [place] [address] [phone]                Discharge Exam: Fredricka Weights   08/11/24 0517 08/12/24  0500 08/13/24 0500  Weight: 69.1 kg 69.1 kg 70 kg   General; NAD  Condition at discharge: stable  The results of significant diagnostics from this hospitalization (including imaging, microbiology, ancillary and laboratory) are listed below for reference.   Imaging Studies: DG CHEST PORT 1 VIEW Result Date: 08/11/2024 CLINICAL DATA:  Pleural effusion. EXAM: PORTABLE CHEST 1 VIEW COMPARISON:  Chest radiograph dated 08/08/2024. FINDINGS: Small bilateral pleural effusions. No consolidative changes or pneumothorax. Cardiomegaly with mild central vascular congestion. Atherosclerotic calcification of the aorta. Median sternotomy wires. No acute osseous pathology. Degenerative changes of the spine and shoulders. IMPRESSION: Cardiomegaly with mild central vascular congestion and small bilateral pleural effusions. Electronically Signed   By: Vanetta Chou M.D.   On: 08/11/2024 08:39   CT Chest Wo Contrast Result Date: 08/08/2024 EXAM: CT CHEST WITHOUT CONTRAST 08/08/2024 03:29:53 PM TECHNIQUE: CT of the chest was performed without the administration of intravenous contrast. Multiplanar reformatted images are provided for review. Automated exposure control, iterative reconstruction, and/or weight based adjustment of the mA/kV was utilized to reduce  the radiation dose to as low as reasonably achievable. COMPARISON: 06/02/2024 CLINICAL HISTORY: Evaluation for rib fractures right side as well as infiltrate. FINDINGS: MEDIASTINUM: Thoracic aortic, coronary artery, and branch vessel atheromatous vascular calcifications. Substantial cardiomegaly. Aortic valve prosthesis. Mitral valve calcifications. Prominent main pulmonary artery suggests pulmonary arterial hypertension. The central airways are clear. LYMPH NODES: Scattered small mediastinal lymph nodes. Right hilar node 1.4 cm in short axis on image 73 series 504, previously the same. No axillary lymphadenopathy. LUNGS AND PLEURA: Moderate to large bilateral  pleural effusions with associated passive atelectasis, increased in volume from prior. Stable bandlike right apical scarring anteriorly with marginal fiducials. Emphysema. Airway thickening suggest bronchitis or reactive airways disease. Mild atelectasis posteriorly in the left upper lobe along the major fissure. Left upper lobe subsolid nodule noted on the prior exam is not readily visible on todays exam although is in a region of motion artifact. No pneumothorax. SOFT TISSUES/BONES: Severe degenerative shoulder arthropathy noted. No rib fracture identified. Thoracic spondylosis with multilevel bridging osteophytes. Motion artifact is present, reducing diagnostic sensitivity and specificity. UPPER ABDOMEN: Limited images of the upper abdomen demonstrates abdominal aortic atherosclerosis. IMPRESSION: 1. No acute rib fracture identified. 2. Moderate to large bilateral pleural effusions with associated passive atelectasis, increased from prior. 3. Emphysema with airway thickening consistent with bronchitis or reactive airways disease. 4. Substantial cardiomegaly with aortic valve prosthesis, mitral valve calcifications, and prominent main pulmonary artery suggesting pulmonary arterial hypertension. 5. Mild left upper lobe atelectasis along the major fissure. 6. Severe degenerative shoulder arthropathy and thoracic spondylosis with multilevel bridging osteophytes. 7. Stable irregular bandlike opacity at the right lung apex with adjacent fiducials. Electronically signed by: Ryan Salvage MD 08/08/2024 04:01 PM EST RP Workstation: [id number]   DG Chest Portable 1 View Result Date: 08/08/2024 CLINICAL DATA:  Shortness of breath EXAM: PORTABLE CHEST 1 VIEW COMPARISON:  April 20, 2024 FINDINGS: Stable cardiomegaly. Status post aortic valve repair. Minimal bibasilar subsegmental atelectasis. Degenerative changes are seen involving both shoulder joints. IMPRESSION: Minimal bibasilar subsegmental atelectasis.  Electronically Signed   By: Lynwood Landy Raddle M.D.   On: 08/08/2024 15:52   CT Cervical Spine Wo Contrast Result Date: 08/08/2024 EXAM: CT CERVICAL SPINE WITHOUT CONTRAST 08/08/2024 03:29:53 PM TECHNIQUE: CT of the cervical spine was performed without the administration of intravenous contrast. Multiplanar reformatted images are provided for review. Automated exposure control, iterative reconstruction, and/or weight based adjustment of the mA/kV was utilized to reduce the radiation dose to as low as reasonably achievable. COMPARISON: 518 2025. CLINICAL HISTORY: Polytrauma, blunt. FINDINGS: CERVICAL SPINE: BONES AND ALIGNMENT: Similar alignment of the cervical spine. Trace degenerative retrolisthesis of C3 on C4 as well as trace degenerative anterolisthesis of C4 on C5 and C7 on T1 are unchanged. No evidence of traumatic malalignment. Similar alignment at the atlantoaxial articulations. Findings suggestive of mild basilar infarct. Degenerative soft tissue mineralization along the dorsal aspect of the dens slightly indenting the ventral thecal sac. DEGENERATIVE CHANGES: Severe disc space narrowing at C3-C4, C4-C5, C5-C6, and C6-C7. Disc osteophyte complexes at multiple levels. 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 Mild spinal canal stenosis at C3-C4, C5-C6, and C6-C7. There is no evidence of high grade osseous spinal canal stenosis. Arthrosis and uncovertebral hypertrophy with foraminal stenosis at multiple levels throughout the cervical spine, overall most pronounced at C3-C4 and C6-C7. SOFT TISSUES: No prevertebral soft tissue swelling. LUNGS AND PLEURA: Partially visualized bilateral pleural effusions. Scarring in the right lung apex. Emphysema. IMPRESSION:  1. No acute traumatic abnormality of the cervical spine. 2. Severe multilevel degenerative changes as above. 3. Partially visualized bilateral pleural effusions. Electronically signed by:  Donnice Mania MD 08/08/2024 03:44 PM EST RP Workstation: [id number]   CT Head Wo Contrast Result Date: 08/08/2024 EXAM: CT HEAD WITHOUT 08/08/2024 03:29:53 PM TECHNIQUE: CT of the head was performed without the administration of intravenous contrast. Automated exposure control, iterative reconstruction, and/or weight based adjustment of the mA/kV was utilized to reduce the radiation dose to as low as reasonably achievable. COMPARISON: 04/19/2024 CLINICAL HISTORY: Polytrauma, blunt FINDINGS: BRAIN AND VENTRICLES: No acute intracranial hemorrhage. No mass effect or midline shift. No extra-axial fluid collection. Similar appearance of mild parenchymal volume loss. Similar moderate chronic microvascular ischemic changes. 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 Atherosclerosis of the carotid siphons and intracranial vertebral arteries similar to prior. No hydrocephalus. ORBITS: Bilateral lens replacement similar to prior. SINUSES AND MASTOIDS: No acute abnormality. SOFT TISSUES AND SKULL: No acute skull fracture. Similar appearance of calcified atherosclerosis of multiple scalp vessels. IMPRESSION: 1. No acute intracranial abnormality. 2. Mild parenchymal volume loss and moderate chronic microvascular ischemic changes, unchanged from prior. 3. 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 Electronically signed by: Donnice Mania MD 08/08/2024 03:37 PM EST RP Workstation: [id number]    Microbiology: Results for orders placed or performed during the hospital encounter of 11/14/21  Fungus Culture With Stain     Status: None   Collection Time: 11/14/21  7:30 AM   Specimen: Bronchial Alveolar Lavage; Respiratory  Result Value Ref Range Status   Fungus Stain Final report  Final   Fungus (Mycology) Culture Final report  Final    Comment: (NOTE) Performed At: [facility] [address] [place], [place] [id number] Jennette Shorter MD  Ey:[id number]    Fungal Source BRONCHIAL ALVEOLAR LAVAGE  Final    Comment: Performed at [facility], [address] [place], [place] [address]  Culture, Respiratory w Gram Stain     Status: None   Collection Time: 11/14/21  7:30 AM   Specimen: Bronchial Alveolar Lavage; Respiratory  Result Value Ref Range Status   Specimen Description BRONCHIAL ALVEOLAR LAVAGE  Final   Special Requests NONE  Final   Gram Stain   Final    FEW WBC PRESENT,BOTH PMN AND MONONUCLEAR NO ORGANISMS SEEN    Culture   Final    NO GROWTH 2 DAYS Performed at [facility], [address], [place], [place] [address]    Report Status 11/16/2021 FINAL  Final  Aerobic/Anaerobic Culture w Gram Stain (surgical/deep wound)     Status: None   Collection Time: 11/14/21  7:30 AM   Specimen: Bronchial Alveolar Lavage; Respiratory  Result Value Ref Range Status   Specimen Description BRONCHIAL ALVEOLAR LAVAGE  Final   Special Requests NONE  Final   Gram Stain NO WBC SEEN NO ORGANISMS SEEN   Final   Culture   Final    No growth aerobically or anaerobically. Performed at [facility], [address] [place], [place] [address]    Report Status 11/19/2021 FINAL  Final  Acid Fast Culture with reflexed sensitivities     Status: None   Collection Time: 11/14/21  7:30 AM   Specimen: Bronchial Alveolar Lavage; Respiratory  Result Value Ref Range Status   Acid Fast Culture Negative  Final    Comment: (NOTE) No acid fast bacilli isolated after 6  weeks. Performed At: [facility] [address] [place], [place] [id number] Jennette Shorter MD Ey:[id number]    Source of Sample BRONCHIAL ALVEOLAR LAVAGE  Final    Comment: Performed at [facility], [address] [place], [place] [address]  Acid Fast Smear (AFB)     Status: None   Collection Time: 11/14/21  7:30 AM   Specimen: Bronchial Alveolar Lavage; Respiratory  Result Value Ref Range Status   AFB Specimen Processing Concentration   Final   Acid Fast Smear Negative  Final    Comment: (NOTE) Performed At: [facility] [address] [place], [place] [id number] Jennette Shorter MD Ey:[id number]    Source (AFB) BRONCHIAL ALVEOLAR LAVAGE  Final    Comment: Performed at [facility], [address], [place], [place] [address]  Fungus Culture With Stain     Status: None   Collection Time: 11/14/21  7:30 AM   Specimen: Bronchial Alveolar Lavage; Respiratory  Result Value Ref Range Status   Fungus Stain Final report  Final   Fungus (Mycology) Culture Final report  Final    Comment: (NOTE) Performed At: [facility] [address] [place], [place] [id number] Jennette Shorter MD Ey:[id number]    Fungal Source BRONCHIAL ALVEOLAR LAVAGE  Final    Comment: Performed at [facility], [address] [place], [place] [address]  Culture, Respiratory w Gram Stain     Status: None   Collection Time: 11/14/21  7:30 AM   Specimen: Bronchial Alveolar Lavage; Respiratory  Result Value Ref Range Status   Specimen Description BRONCHIAL ALVEOLAR LAVAGE  Final   Special Requests NONE  Final   Gram Stain   Final    RARE WBC PRESENT, PREDOMINANTLY MONONUCLEAR NO ORGANISMS SEEN    Culture   Final    RARE Normal respiratory flora-no Staph aureus or Pseudomonas seen Performed at [facility], [address], [place], [place] [address]    Report Status 11/16/2021 FINAL  Final  Aerobic/Anaerobic Culture w Gram Stain (surgical/deep wound)     Status: None   Collection Time: 11/14/21  7:30 AM   Specimen: Bronchial Alveolar Lavage; Respiratory  Result Value Ref Range Status   Specimen Description BRONCHIAL ALVEOLAR LAVAGE  Final   Special Requests NONE  Final   Gram Stain   Final    RARE WBC PRESENT, PREDOMINANTLY MONONUCLEAR NO ORGANISMS SEEN    Culture   Final    RARE Normal respiratory flora-no Staph aureus or Pseudomonas seen NO ANAEROBES ISOLATED Performed at [facility] [place] [facility], [address] [place], [place] [address]    Report Status 11/19/2021 FINAL  Final  Acid Fast Culture with reflexed sensitivities     Status: None   Collection Time: 11/14/21  7:30 AM   Specimen: Bronchial Alveolar Lavage; Respiratory  Result Value Ref Range Status   Acid Fast Culture Negative  Final    Comment: (NOTE) No acid fast bacilli isolated after 6 weeks. Performed At: [facility] [address] [place], [place] [id number] Jennette Shorter MD Ey:[id number]    Source of Sample BRONCHIAL ALVEOLAR LAVAGE  Final    Comment: Performed at [facility], [address], [place], [place] [address]  Acid Fast Smear (AFB)     Status: None   Collection Time: 11/14/21  7:30 AM   Specimen: Bronchial Alveolar Lavage; Respiratory  Result Value Ref Range Status   AFB Specimen Processing Concentration  Final   Acid Fast  Smear Negative  Final    Comment: (NOTE) Performed At: [facility] [address] [place], [place] [id number] Jennette Shorter MD Ey:[id number]    Source (AFB) BRONCHIAL ALVEOLAR LAVAGE  Final    Comment: Performed at [facility], [address], [place], [place] [address]  Fungus Culture With Stain     Status: None   Collection Time: 11/14/21  7:30 AM   Specimen: Bronchial Alveolar Lavage; Respiratory  Result Value Ref Range Status   Fungus Stain Final report  Final   Fungus (Mycology) Culture Final report  Final    Comment: (NOTE) Performed At: [facility] [address] [place], [place] [id number] Jennette Shorter MD Ey:[id number]    Fungal Source BRONCHIAL ALVEOLAR LAVAGE  Final    Comment: Performed at [facility], [address], [place], [place] [address]  Culture, Respiratory w Gram Stain     Status: None   Collection Time: 11/14/21  7:30 AM   Specimen: Bronchial Alveolar Lavage; Respiratory  Result Value Ref Range Status   Specimen Description BRONCHIAL ALVEOLAR LAVAGE  Final   Special Requests NONE  Final   Gram Stain   Final     RARE WBC PRESENT, PREDOMINANTLY MONONUCLEAR NO ORGANISMS SEEN    Culture   Final    RARE Normal respiratory flora-no Staph aureus or Pseudomonas seen Performed at [facility], [address] [place], [place] [address]    Report Status 11/17/2021 FINAL  Final  Aerobic/Anaerobic Culture w Gram Stain (surgical/deep wound)     Status: None   Collection Time: 11/14/21  7:30 AM   Specimen: Bronchial Alveolar Lavage; Respiratory  Result Value Ref Range Status   Specimen Description BRONCHIAL ALVEOLAR LAVAGE  Final   Special Requests NONE  Final   Gram Stain   Final    FEW WBC PRESENT, PREDOMINANTLY MONONUCLEAR NO ORGANISMS SEEN    Culture   Final    RARE Normal respiratory flora-no Staph aureus or Pseudomonas seen NO ANAEROBES ISOLATED Performed at [facility], [address] [place], [place] [address]    Report Status 11/19/2021 FINAL  Final  Acid Fast Culture with reflexed sensitivities     Status: None   Collection Time: 11/14/21  7:30 AM   Specimen: Bronchial Alveolar Lavage; Respiratory  Result Value Ref Range Status   Acid Fast Culture Negative  Final    Comment: (NOTE) No acid fast bacilli isolated after 6 weeks. Performed At: [facility] [address] [place], [place] [id number] Jennette Shorter MD Ey:[id number]    Source of Sample BRONCHIAL ALVEOLAR LAVAGE  Final    Comment: Performed at [facility], [address] [place], [place] [address]  Acid Fast Smear (AFB)     Status: None   Collection Time: 11/14/21  7:30 AM   Specimen: Bronchial Alveolar Lavage; Respiratory  Result Value Ref Range Status   AFB Specimen Processing Concentration  Final   Acid Fast Smear Negative  Final    Comment: (NOTE) Performed At: [facility] [address] [place], [place] [id number] Jennette Shorter MD Ey:[id number]    Source (AFB) BRONCHIAL ALVEOLAR LAVAGE  Final    Comment: Performed at [facility], [address], [place], [place]  [address]  Fungus Culture Result     Status: None   Collection Time: 11/14/21  7:30 AM  Result Value Ref Range Status   Result 1 Comment  Final    Comment: (NOTE) KOH/Calcofluor  preparation:  no fungus observed. Performed At: [facility] [address] [place], [place] [id number] Jennette Shorter MD Ey:[id number]   Fungus Culture Result     Status: None   Collection Time: 11/14/21  7:30 AM  Result Value Ref Range Status   Result 1 Comment  Final    Comment: (NOTE) KOH/Calcofluor preparation:  no fungus observed. Performed At: [facility] [address] [place], [place] [id number] Jennette Shorter MD Ey:[id number]   Fungus Culture Result     Status: None   Collection Time: 11/14/21  7:30 AM  Result Value Ref Range Status   Result 1 Comment  Final    Comment: (NOTE) KOH/Calcofluor preparation:  no fungus observed. Performed At: [facility] [address] [place], [place] [id number] Jennette Shorter MD Ey:[id number]   Fungal organism reflex     Status: None   Collection Time: 11/14/21  7:30 AM  Result Value Ref Range Status   Fungal result 1 Comment  Final    Comment: (NOTE) No yeast or mold isolated after 4 weeks. Performed At: [facility] [address] [place], [place] [id number] Jennette Shorter MD Ey:[id number]   Fungal organism reflex     Status: None   Collection Time: 11/14/21  7:30 AM  Result Value Ref Range Status   Fungal result 1 Comment  Final    Comment: (NOTE) No yeast or mold isolated after 4 weeks. Performed At: [facility] [address] [place], [place] [id number] Jennette Shorter MD Ey:[id number]   Fungal organism reflex     Status: None   Collection Time: 11/14/21  7:30 AM  Result Value Ref Range Status   Fungal result 1 Comment  Final    Comment: (NOTE) No yeast or mold isolated after 4 weeks. Performed At: [facility] [address] [place], [place] [id number] Jennette Shorter MD Ey:[id number]      Labs: CBC: Recent Labs  Lab 08/08/24 1316 08/10/24 0552  WBC 12.1* 8.5  HGB 12.5* 10.7*  HCT 39.3 32.8*  MCV 96.3 94.8  PLT 197 186   Basic Metabolic Panel: Recent Labs  Lab 08/08/24 1316 08/09/24 0527 08/10/24 0552 08/11/24 0420 08/12/24 0520  NA 140 144 140 139 140  K 3.8 3.6 3.1* 3.3* 3.6  CL 101 106 102 101 101  CO2 25 28 30 31  32  GLUCOSE 102* 91 97 110* 96  BUN 22 21 23 22  25*  CREATININE 1.00 0.99 0.98 1.00 1.19  CALCIUM 9.2 8.8* 8.4* 8.6* 8.8*  MG  --   --  2.1  --   --    Liver Function Tests: Recent Labs  Lab 08/08/24 1316  AST 93*  ALT 60*  ALKPHOS 100  BILITOT 1.0  PROT 6.8  ALBUMIN  3.8   CBG: No results for input(s): GLUCAP in the last 168 hours.  Discharge time spent: greater than 30 minutes.  Signed: Owen DELENA Lore, MD [facility] 08/13/2024

## 2024-08-13 NOTE — Care Management Important Message (Signed)
 Important Message  Patient Details IM Letter given. Name: [name] MRN: [id number] Date of Birth: 08/06/1936   Important Message Given:  Yes - Medicare IM     [name] 08/13/2024, 9:10 AM

## 2024-08-13 NOTE — TOC Progression Note (Addendum)
 Transition of Care [facility]) - Progression Note    Patient Details  Name: [name] MRN: [id number] Date of Birth: July 16, 1936  Transition of Care [facility]) CM/SW Contact  Pratik Dalziel, Nathanel, RN Phone Number: 08/13/2024, 9:28 AM  Clinical Narrative:  Await choice, then auth.        Service Provider Request Status Stars Address Phone  [facility] Preferred SNF  Accepted 2 [address] [place] [address] [phone]  [facility] SNF  Accepted  [address], [place] [address] [phone]  [facility] SNF  Accepted 3 [address] [place] [address] [phone]  [facility] SNF  Accepted 1 [address] [place] [address] [phone]  [facility] SNF  Accepted 2 [address], [place] [address] [phone]  [facility] Preferred SNF  Accepted 5 [address], [place] [address] [phone]  [facility] Preferred SNF  Accepted 3 [address] [place] [address] [phone]  [facility] SNF  Accepted 2 [address] [place] [address] [phone]  [name] [place] [facility]  Accepted 4 [address] [place] [address] [phone]  [name]  Accepted 4 [address] [name] [address] [phone]    -12:39p-patient/dtr [name] aware.auth initiated-pend auth [id number] auth. -2:18p auth still pending-when checking the auth in [employer].   Expected Discharge Plan: Skilled Nursing Facility Barriers to Discharge:  (faxed out await choices)               Expected Discharge Plan and Services   Discharge Planning Services: CM Consult Post Acute Care Choice: Skilled Nursing Facility Living arrangements for the past 2 months: Single Family Home Expected Discharge Date: 08/12/24                                      Social Drivers of Health (SDOH) Interventions SDOH Screenings   Food Insecurity: No Food Insecurity (08/09/2024)  Housing: Low Risk (08/09/2024)  Transportation Needs: No Transportation Needs (08/09/2024)  Utilities: Not At Risk (08/09/2024)  Alcohol Screen: Low Risk (10/30/2023)  Depression (PHQ2-9): Low Risk (05/15/2024)  Financial Resource Strain: Low Risk (10/30/2023)  Physical Activity: Insufficiently Active (10/30/2023)  Social Connections: Moderately Isolated (08/09/2024)  Stress: No Stress Concern Present (10/30/2023)  Tobacco Use: Medium Risk (08/10/2024)  Health Literacy: Adequate Health Literacy (10/30/2023)    Readmission Risk Interventions    08/11/2024   10:13 AM  Readmission Risk Prevention Plan  PCP or Specialist Appt within 3-5 Days Complete  HRI or Home Care Consult Complete  Social Work Consult for Recovery Care Planning/Counseling Complete  Palliative Care Screening Complete  Medication Review Oceanographer) Complete

## 2024-08-13 NOTE — Progress Notes (Signed)
 PHARMACY - ANTICOAGULATION CONSULT NOTE  Pharmacy Consult for warfarin Indication: hx atrial fibrillation  Allergies[1]  Patient Measurements: Height: 5' 6 (167.6 cm) Weight: 70 kg (154 lb 5.2 oz) IBW/kg (Calculated) : 63.8  Vital Signs: Temp: 97.8 F (36.6 C) (12/17 0526) Temp Source: Oral (12/17 0526) BP: 121/45 (12/17 0526) Pulse Rate: 76 (12/17 0526)  Labs: Recent Labs    08/11/24 0420 08/12/24 0520 08/13/24 0532  LABPROT 24.1* 23.8* 24.2*  INR 2.0* 2.0* 2.1*  CREATININE 1.00 1.19  --     Estimated Creatinine Clearance: 40.2 mL/min (by C-G formula based on SCr of 1.19 mg/dL).   Medical History: Past Medical History:  Diagnosis Date   Aortic stenosis    Atrial fibrillation (HCC)    Baker cyst    Left knee   DJD (degenerative joint disease)    knees   Dysrhythmia    Eczema    Erectile dysfunction    Gout    History of hiatal hernia    History of radiation therapy    Right lung- 04/21/21-05/03/21 Dr. Lynwood Nasuti   History of repair of thoracic aortic aneurysm 2006   HTN (hypertension)    Mild mitral regurgitation by prior echocardiogram    Moderate tricuspid regurgitation by prior echocardiogram    MVP (mitral valve prolapse)    Shortness of breath dyspnea    with activity    Medications:  - PTA warfarin regimen: 2.5 mg daily except for 3.75 mg on Fri with INR goal 2-2.5 (per [facility] clinic note on 12/2)  Assessment: Patient is an 88 y.o M with hx afib on warfarin PTA who presented to the ED on 08/08/24 for evaluation s/p falls and c/o SOB. Warfarin resumed on admission.  Today, 08/13/2024: - INR remains therapeutic at 2.1 - CBC on 12/14: hgb down 10.7, plts 186K - no bleeding documented  - no significant drug-drug intxns noted - ate 100% of meal 12/16  Goal of Therapy:  INR 2-2.5 Monitor platelets by anticoagulation protocol: Yes   Plan:  - Warfarin 2.5 mg PO x1 today - daily INR - monitor for s/sx bleeding    Iantha Batch, PharmD,  BCPS 08/13/2024 8:43 AM     [1] No Known Allergies

## 2024-08-14 DIAGNOSIS — R0602 Shortness of breath: Secondary | ICD-10-CM | POA: Diagnosis not present

## 2024-08-14 LAB — PROTIME-INR
INR: 2.3 — ABNORMAL HIGH (ref 0.8–1.2)
Prothrombin Time: 26.8 s — ABNORMAL HIGH (ref 11.4–15.2)

## 2024-08-14 MED ADMIN — Potassium Chloride Microencapsulated Crys ER Tab 20 mEq: 20 meq | ORAL | @ 09:00:00 | NDC 00245531911

## 2024-08-14 NOTE — TOC Transition Note (Addendum)
 Transition of Care [facility]) - Discharge Note   Patient Details  Name: [name] MRN: [id number] Date of Birth: December 05, 1935  Transition of Care [facility]) CM/SW Contact:  Bascom Service, RN Phone Number: 08/14/2024, 9:56 AM   Clinical Narrative:Received shara #[phone] till 12/22 for [facility] rep Wilber accepted,Has bed available. MD updated await d/c summary.  -12p-going to [employer] rm#[id number],report#[id number]. [facility] called. No further CM needs.      Final next level of care: Skilled Nursing Facility Barriers to Discharge: No Barriers Identified   Patient Goals and CMS Choice Patient states their goals for this hospitalization and ongoing recovery are:: Rehab CMS Medicare.gov Compare Post Acute Care list provided to:: Patient Represenative (must comment) (Sandra(dtr)) Choice offered to / list presented to : Adult Children [place] ownership interest in [facility].provided to:: Adult Children    Discharge Placement              Patient chooses bed at: [facility] Patient to be transferred to facility by: PTAR Name of family member notified: Sandra(dtr) Patient and family notified of of transfer: 08/14/24  Discharge Plan and Services Additional resources added to the After Visit Summary for     Discharge Planning Services: CM Consult Post Acute Care Choice: Skilled Nursing Facility                               Social Drivers of Health (SDOH) Interventions SDOH Screenings   Food Insecurity: No Food Insecurity (08/09/2024)  Housing: Low Risk (08/09/2024)  Transportation Needs: No Transportation Needs (08/09/2024)  Utilities: Not At Risk (08/09/2024)  Alcohol Screen: Low Risk (10/30/2023)  Depression (PHQ2-9): Low Risk (05/15/2024)  Financial Resource Strain: Low Risk (10/30/2023)  Physical Activity: Insufficiently Active (10/30/2023)  Social Connections: Moderately Isolated (08/09/2024)  Stress: No Stress Concern Present (10/30/2023)   Tobacco Use: Medium Risk (08/10/2024)  Health Literacy: Adequate Health Literacy (10/30/2023)     Readmission Risk Interventions    08/11/2024   10:13 AM  Readmission Risk Prevention Plan  PCP or Specialist Appt within 3-5 Days Complete  HRI or Home Care Consult Complete  Social Work Consult for Recovery Care Planning/Counseling Complete  Palliative Care Screening Complete  Medication Review Oceanographer) Complete

## 2024-08-14 NOTE — Telephone Encounter (Signed)
 LVM for pt to return call

## 2024-08-14 NOTE — Discharge Summary (Signed)
 Physician Discharge Summary   Patient: [name] MRN: [id number] DOB: 29-Apr-1936  Admit date:     08/08/2024  Discharge date: 08/14/2024  Discharge Physician: Owen DELENA Lore   PCP: Theophilus Andrews, Tully GRADE, MD   Recommendations at discharge:    Needs close follow up with cardiology, further adjustment of diuretics as needed.  Needs to remain on protonix  while on prednisone .  Of note Amlodipine  discontinue due to orthostatic hypotension.  Continue to monitor INR level.   Discharge Diagnoses: Principal Problem:   CHF exacerbation (HCC) Active Problems:   Essential hypertension   Aortic valve disorder   Chronic atrial fibrillation (HCC)   Anxiety   S/P AVR (aortic valve replacement)   Chronic diastolic CHF (congestive heart failure) (HCC)   Non-small cell lung cancer (HCC)   Centrilobular emphysema (HCC)  Resolved Problems:   * No resolved hospital problems. *  Hospital Course: 88 year old with past medical history significant for aortic stenosis status post TAVR on warfarin, chronic A-fib, history of thoracic aortic aneurysmal repair, hypertension, mitral valve prolapse, presents complaining of shortness of breath, fall and dizziness. Patient had a fall at home, normally uses a walker but has been having dizziness and shortness of breath and weakness. In the ED he was noted to have leukocytosis, white count 12.1, hemoglobin 12.5, elevated AST 93, ALT 68. UA negative for infection. proBNP elevated. INR 1.3. Chest x-ray basilar atelectasis. CT cervical spine no traumatic abnormality. CT head no acute intracranial abnormality. CT chest with no acute rib fractures, moderate to large bilateral pleural effusion with associated atelectasis and emphysema. Patient was admitted for heart failure exacerbation.   Stable for discharge awaiting placemenet.   Assessment and Plan: 1-Acute on chronic diastolic heart failure - Patient presented with worsening shortness of breath,  bilateral pleural effusion.  Elevated BNP 31,007 - Treated  with 40 mg IV Lasix  BID> - Strict I's and O - Weight : 154 ----152--152 -Chest x ray: Cardiomegaly with mild central vascular congestion and small bilateral pleural effusions. X ray looks better, pleural effusion smaller. Plan to transition to oral diuretics today.  Discharge on 40 mg daily lasix .  Needs follow up with cardiology  Stable for discharge  Multiple falls at home - Multiple falls at home and generalized weakness.  Also reports some dizziness. PT OT eval Will benefit from rehab.    Orthostatic Hypotension. Stop Norvasc .    History of AVR - On chronic warfarin.  Continue per pharmacy   History of non-small cell lung cancer In remission.   Essential hypertension Will hold amlodipine   due to orthostatic hypotension.  Monitor blood pressure while on diuretic   History of COPD Continue as needed albuterol    DVT prophylaxis: Coumadin    Hypokalemia: Replete orally                Consultants: none Procedures performed: none Disposition: Skilled nursing facility Diet recommendation:  Cardiac diet DISCHARGE MEDICATION: Allergies as of 08/14/2024   No Known Allergies      Medication List     STOP taking these medications    amLODipine  5 MG tablet Commonly known as: NORVASC        TAKE these medications    albuterol  (2.5 MG/3ML) 0.083% nebulizer solution Commonly known as: PROVENTIL  Inhale 3 mLs into the lungs every 4 (four) hours as needed for wheezing or shortness of breath.   allopurinol  300 MG tablet Commonly known as: ZYLOPRIM  Take 1 tablet by mouth once daily   cyanocobalamin  500 MCG  tablet Commonly known as: VITAMIN B12 Take 1 tablet (500 mcg total) by mouth daily.   feeding supplement Liqd Take 237 mLs by mouth 2 (two) times daily between meals. What changed:  when to take this reasons to take this   furosemide  40 MG tablet Commonly known as: LASIX  Take 1 tablet  (40 mg total) by mouth daily. What changed:  medication strength how much to take   guaiFENesin  600 MG 12 hr tablet Commonly known as: MUCINEX  Take 1 tablet (600 mg total) by mouth 2 (two) times daily.   pantoprazole  40 MG tablet Commonly known as: PROTONIX  Take 1 tablet (40 mg total) by mouth daily.   potassium chloride  SA 20 MEQ tablet Commonly known as: KLOR-CON  M Take 1 tablet by mouth once daily   predniSONE  20 MG tablet Commonly known as: DELTASONE  Take 1 tablet by mouth once daily with breakfast   tamsulosin  0.4 MG Caps capsule Commonly known as: FLOMAX  Take 1 capsule (0.4 mg total) by mouth daily after supper. What changed: when to take this   traMADol  50 MG tablet Commonly known as: ULTRAM  Take 1 tablet (50 mg total) by mouth every 12 (twelve) hours as needed. What changed:  when to take this reasons to take this additional instructions   traZODone  50 MG tablet Commonly known as: DESYREL  Take 1-2 tablets (50-100 mg total) by mouth at bedtime.   Vitamin D3 125 MCG (5000 UT) Caps Take 5,000 Units by mouth daily after breakfast.   warfarin 2.5 MG tablet Commonly known as: COUMADIN  Take as directed. If you are unsure how to take this medication, talk to your nurse or doctor. Original instructions: TAKE 1 TABLET BY MOUTH DAILY EXCEPT TAKE 1 1/2 TABLET ON FRIDAY OR AS DIRECTED BY ANTICOAGULATION CLINIC What changed:  how much to take how to take this when to take this additional instructions        Contact information for follow-up providers     Theophilus Andrews, Tully GRADE, MD Follow up in 2 week(s).   Specialty: Internal Medicine Contact information: [address] [employer] [place] [address] [phone]         Emelia Josefa HERO, NP Follow up in 1 week(s).   Specialty: Cardiology Why: keep appointment on friday with cardiology Contact information: [address] [place] [address] [phone]              Contact  information for after-discharge care     Destination     [facility] .   Service: Skilled Nursing Contact information: [address] [place]  [address] [phone]                    Discharge Exam: Filed Weights   08/12/24 0500 08/13/24 0500 08/14/24 0416  Weight: 69.1 kg 70 kg 69.3 kg   General; NAD  Condition at discharge: stable  The results of significant diagnostics from this hospitalization (including imaging, microbiology, ancillary and laboratory) are listed below for reference.   Imaging Studies: DG CHEST PORT 1 VIEW Result Date: 08/11/2024 CLINICAL DATA:  Pleural effusion. EXAM: PORTABLE CHEST 1 VIEW COMPARISON:  Chest radiograph dated 08/08/2024. FINDINGS: Small bilateral pleural effusions. No consolidative changes or pneumothorax. Cardiomegaly with mild central vascular congestion. Atherosclerotic calcification of the aorta. Median sternotomy wires. No acute osseous pathology. Degenerative changes of the spine and shoulders. IMPRESSION: Cardiomegaly with mild central vascular congestion and small bilateral pleural effusions. Electronically Signed   By: Vanetta  Radparvar M.D.   On: 08/11/2024 08:39   CT Chest Wo Contrast Result Date: 08/08/2024 EXAM: CT CHEST WITHOUT CONTRAST 08/08/2024 03:29:53 PM TECHNIQUE: CT of the chest was performed without the administration of intravenous contrast. Multiplanar reformatted images are provided for review. Automated exposure control, iterative reconstruction, and/or weight based adjustment of the mA/kV was utilized to reduce the radiation dose to as low as reasonably achievable. COMPARISON: 06/02/2024 CLINICAL HISTORY: Evaluation for rib fractures right side as well as infiltrate. FINDINGS: MEDIASTINUM: Thoracic aortic, coronary artery, and branch vessel atheromatous vascular calcifications. Substantial cardiomegaly. Aortic valve prosthesis. Mitral valve calcifications.  Prominent main pulmonary artery suggests pulmonary arterial hypertension. The central airways are clear. LYMPH NODES: Scattered small mediastinal lymph nodes. Right hilar node 1.4 cm in short axis on image 73 series 504, previously the same. No axillary lymphadenopathy. LUNGS AND PLEURA: Moderate to large bilateral pleural effusions with associated passive atelectasis, increased in volume from prior. Stable bandlike right apical scarring anteriorly with marginal fiducials. Emphysema. Airway thickening suggest bronchitis or reactive airways disease. Mild atelectasis posteriorly in the left upper lobe along the major fissure. Left upper lobe subsolid nodule noted on the prior exam is not readily visible on todays exam although is in a region of motion artifact. No pneumothorax. SOFT TISSUES/BONES: Severe degenerative shoulder arthropathy noted. No rib fracture identified. Thoracic spondylosis with multilevel bridging osteophytes. Motion artifact is present, reducing diagnostic sensitivity and specificity. UPPER ABDOMEN: Limited images of the upper abdomen demonstrates abdominal aortic atherosclerosis. IMPRESSION: 1. No acute rib fracture identified. 2. Moderate to large bilateral pleural effusions with associated passive atelectasis, increased from prior. 3. Emphysema with airway thickening consistent with bronchitis or reactive airways disease. 4. Substantial cardiomegaly with aortic valve prosthesis, mitral valve calcifications, and prominent main pulmonary artery suggesting pulmonary arterial hypertension. 5. Mild left upper lobe atelectasis along the major fissure. 6. Severe degenerative shoulder arthropathy and thoracic spondylosis with multilevel bridging osteophytes. 7. Stable irregular bandlike opacity at the right lung apex with adjacent fiducials. Electronically signed by: Ryan Salvage MD 08/08/2024 04:01 PM EST RP Workstation: [id number]   DG Chest Portable 1 View Result Date: 08/08/2024 CLINICAL  DATA:  Shortness of breath EXAM: PORTABLE CHEST 1 VIEW COMPARISON:  April 20, 2024 FINDINGS: Stable cardiomegaly. Status post aortic valve repair. Minimal bibasilar subsegmental atelectasis. Degenerative changes are seen involving both shoulder joints. IMPRESSION: Minimal bibasilar subsegmental atelectasis. Electronically Signed   By: Lynwood Landy Raddle M.D.   On: 08/08/2024 15:52   CT Cervical Spine Wo Contrast Result Date: 08/08/2024 EXAM: CT CERVICAL SPINE WITHOUT CONTRAST 08/08/2024 03:29:53 PM TECHNIQUE: CT of the cervical spine was performed without the administration of intravenous contrast. Multiplanar reformatted images are provided for review. Automated exposure control, iterative reconstruction, and/or weight based adjustment of the mA/kV was utilized to reduce the radiation dose to as low as reasonably achievable. COMPARISON: 518 2025. CLINICAL HISTORY: Polytrauma, blunt. FINDINGS: CERVICAL SPINE: BONES AND ALIGNMENT: Similar alignment of the cervical spine. Trace degenerative retrolisthesis of C3 on C4 as well as trace degenerative anterolisthesis of C4 on C5 and C7 on T1 are unchanged. No evidence of traumatic malalignment. Similar alignment at the atlantoaxial articulations. Findings suggestive of mild basilar infarct. Degenerative soft tissue mineralization along the dorsal aspect of the dens slightly indenting the ventral thecal sac. DEGENERATIVE CHANGES: Severe disc space narrowing at C3-C4, C4-C5, C5-C6, and C6-C7. Disc osteophyte complexes at multiple levels. 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 Mild spinal canal stenosis  at C3-C4, C5-C6, and C6-C7. There is no evidence of high grade osseous spinal canal stenosis. Arthrosis and uncovertebral hypertrophy with foraminal stenosis at multiple levels throughout the cervical spine, overall most pronounced at C3-C4 and C6-C7. SOFT TISSUES: No prevertebral soft tissue swelling. LUNGS  AND PLEURA: Partially visualized bilateral pleural effusions. Scarring in the right lung apex. Emphysema. IMPRESSION: 1. No acute traumatic abnormality of the cervical spine. 2. Severe multilevel degenerative changes as above. 3. Partially visualized bilateral pleural effusions. Electronically signed by: Donnice Mania MD 08/08/2024 03:44 PM EST RP Workstation: [id number]   CT Head Wo Contrast Result Date: 08/08/2024 EXAM: CT HEAD WITHOUT 08/08/2024 03:29:53 PM TECHNIQUE: CT of the head was performed without the administration of intravenous contrast. Automated exposure control, iterative reconstruction, and/or weight based adjustment of the mA/kV was utilized to reduce the radiation dose to as low as reasonably achievable. COMPARISON: 04/19/2024 CLINICAL HISTORY: Polytrauma, blunt FINDINGS: BRAIN AND VENTRICLES: No acute intracranial hemorrhage. No mass effect or midline shift. No extra-axial fluid collection. Similar appearance of mild parenchymal volume loss. Similar moderate chronic microvascular ischemic changes. 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 Atherosclerosis of the carotid siphons and intracranial vertebral arteries similar to prior. No hydrocephalus. ORBITS: Bilateral lens replacement similar to prior. SINUSES AND MASTOIDS: No acute abnormality. SOFT TISSUES AND SKULL: No acute skull fracture. Similar appearance of calcified atherosclerosis of multiple scalp vessels. IMPRESSION: 1. No acute intracranial abnormality. 2. Mild parenchymal volume loss and moderate chronic microvascular ischemic changes, unchanged from prior. 3. 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 Electronically signed by: Donnice Mania MD 08/08/2024 03:37 PM EST RP Workstation: [id number]    Microbiology: Results for orders placed or performed during the hospital encounter of 11/14/21  Fungus Culture With Stain     Status: None   Collection Time: 11/14/21   7:30 AM   Specimen: Bronchial Alveolar Lavage; Respiratory  Result Value Ref Range Status   Fungus Stain Final report  Final   Fungus (Mycology) Culture Final report  Final    Comment: (NOTE) Performed At: [facility] [address] [place], [place] [id number] Jennette Shorter MD Ey:[id number]    Fungal Source BRONCHIAL ALVEOLAR LAVAGE  Final    Comment: Performed at [facility], [address] [place], [place] [address]  Culture, Respiratory w Gram Stain     Status: None   Collection Time: 11/14/21  7:30 AM   Specimen: Bronchial Alveolar Lavage; Respiratory  Result Value Ref Range Status   Specimen Description BRONCHIAL ALVEOLAR LAVAGE  Final   Special Requests NONE  Final   Gram Stain   Final    FEW WBC PRESENT,BOTH PMN AND MONONUCLEAR NO ORGANISMS SEEN    Culture   Final    NO GROWTH 2 DAYS Performed at [facility], [address] [place], [place] [address]    Report Status 11/16/2021 FINAL  Final  Aerobic/Anaerobic Culture w Gram Stain (surgical/deep wound)     Status: None   Collection Time: 11/14/21  7:30 AM   Specimen: Bronchial Alveolar Lavage; Respiratory  Result Value Ref Range Status   Specimen Description BRONCHIAL ALVEOLAR LAVAGE  Final   Special Requests NONE  Final   Gram Stain NO WBC SEEN NO ORGANISMS SEEN   Final   Culture   Final    No growth aerobically or anaerobically. Performed at [facility], [address] [place], [place] [address]  Report Status 11/19/2021 FINAL  Final  Acid Fast Culture with reflexed sensitivities     Status: None   Collection Time: 11/14/21  7:30 AM   Specimen: Bronchial Alveolar Lavage; Respiratory  Result Value Ref Range Status   Acid Fast Culture Negative  Final    Comment: (NOTE) No acid fast bacilli isolated after 6 weeks. Performed At: [facility] [address] [place], [place] [id number] Jennette Shorter MD Ey:[id number]    Source of Sample BRONCHIAL ALVEOLAR LAVAGE  Final     Comment: Performed at [facility], [address], [place], [place] [address]  Acid Fast Smear (AFB)     Status: None   Collection Time: 11/14/21  7:30 AM   Specimen: Bronchial Alveolar Lavage; Respiratory  Result Value Ref Range Status   AFB Specimen Processing Concentration  Final   Acid Fast Smear Negative  Final    Comment: (NOTE) Performed At: [facility] [address] [place], [place] [id number] Jennette Shorter MD Ey:[id number]    Source (AFB) BRONCHIAL ALVEOLAR LAVAGE  Final    Comment: Performed at [facility], [address], [place], [place] [address]  Fungus Culture With Stain     Status: None   Collection Time: 11/14/21  7:30 AM   Specimen: Bronchial Alveolar Lavage; Respiratory  Result Value Ref Range Status   Fungus Stain Final report  Final   Fungus (Mycology) Culture Final report  Final    Comment: (NOTE) Performed At: [facility] [address] [place], [place] [id number] Jennette Shorter MD Ey:[id number]    Fungal Source BRONCHIAL ALVEOLAR LAVAGE  Final    Comment: Performed at [facility], [address] [place], [place] [address]  Culture, Respiratory w Gram Stain     Status: None   Collection Time: 11/14/21  7:30 AM   Specimen: Bronchial Alveolar Lavage; Respiratory  Result Value Ref Range Status   Specimen Description BRONCHIAL ALVEOLAR LAVAGE  Final   Special Requests NONE  Final   Gram Stain   Final    RARE WBC PRESENT, PREDOMINANTLY MONONUCLEAR NO ORGANISMS SEEN    Culture   Final    RARE Normal respiratory flora-no Staph aureus or Pseudomonas seen Performed at [facility], [address], [place], [place] [address]    Report Status 11/16/2021 FINAL  Final  Aerobic/Anaerobic Culture w Gram Stain (surgical/deep wound)     Status: None   Collection Time: 11/14/21  7:30 AM   Specimen: Bronchial Alveolar Lavage; Respiratory  Result Value Ref Range Status   Specimen Description BRONCHIAL ALVEOLAR LAVAGE   Final   Special Requests NONE  Final   Gram Stain   Final    RARE WBC PRESENT, PREDOMINANTLY MONONUCLEAR NO ORGANISMS SEEN    Culture   Final    RARE Normal respiratory flora-no Staph aureus or Pseudomonas seen NO ANAEROBES ISOLATED Performed at [facility] Lab, [address], [place], [place] [address]    Report Status 11/19/2021 FINAL  Final  Acid Fast Culture with reflexed sensitivities     Status: None   Collection Time: 11/14/21  7:30 AM   Specimen: Bronchial Alveolar Lavage; Respiratory  Result Value Ref Range Status   Acid Fast Culture Negative  Final    Comment: (NOTE) No acid fast bacilli isolated after 6 weeks. Performed At: [facility] [address] [place], [place] [id number] Jennette Shorter MD Ey:[id number]    Source of Sample BRONCHIAL ALVEOLAR LAVAGE  Final  Comment: Performed at [facility], [address], [place], [place] [address]  Acid Fast Smear (AFB)     Status: None   Collection Time: 11/14/21  7:30 AM   Specimen: Bronchial Alveolar Lavage; Respiratory  Result Value Ref Range Status   AFB Specimen Processing Concentration  Final   Acid Fast Smear Negative  Final    Comment: (NOTE) Performed At: [facility] [address] [place], [place] [id number] Jennette Shorter MD Ey:[id number]    Source (AFB) BRONCHIAL ALVEOLAR LAVAGE  Final    Comment: Performed at [facility], [address] [place], [place] [address]  Fungus Culture With Stain     Status: None   Collection Time: 11/14/21  7:30 AM   Specimen: Bronchial Alveolar Lavage; Respiratory  Result Value Ref Range Status   Fungus Stain Final report  Final   Fungus (Mycology) Culture Final report  Final    Comment: (NOTE) Performed At: [facility] [address] [place], [place] [id number] Jennette Shorter MD Ey:[id number]    Fungal Source BRONCHIAL ALVEOLAR LAVAGE  Final    Comment: Performed at [facility], [address], [place], [place]  [address]  Culture, Respiratory w Gram Stain     Status: None   Collection Time: 11/14/21  7:30 AM   Specimen: Bronchial Alveolar Lavage; Respiratory  Result Value Ref Range Status   Specimen Description BRONCHIAL ALVEOLAR LAVAGE  Final   Special Requests NONE  Final   Gram Stain   Final    RARE WBC PRESENT, PREDOMINANTLY MONONUCLEAR NO ORGANISMS SEEN    Culture   Final    RARE Normal respiratory flora-no Staph aureus or Pseudomonas seen Performed at [facility], [address] [place], [place] [address]    Report Status 11/17/2021 FINAL  Final  Aerobic/Anaerobic Culture w Gram Stain (surgical/deep wound)     Status: None   Collection Time: 11/14/21  7:30 AM   Specimen: Bronchial Alveolar Lavage; Respiratory  Result Value Ref Range Status   Specimen Description BRONCHIAL ALVEOLAR LAVAGE  Final   Special Requests NONE  Final   Gram Stain   Final    FEW WBC PRESENT, PREDOMINANTLY MONONUCLEAR NO ORGANISMS SEEN    Culture   Final    RARE Normal respiratory flora-no Staph aureus or Pseudomonas seen NO ANAEROBES ISOLATED Performed at [facility], [address] [place], [place] [address]    Report Status 11/19/2021 FINAL  Final  Acid Fast Culture with reflexed sensitivities     Status: None   Collection Time: 11/14/21  7:30 AM   Specimen: Bronchial Alveolar Lavage; Respiratory  Result Value Ref Range Status   Acid Fast Culture Negative  Final    Comment: (NOTE) No acid fast bacilli isolated after 6 weeks. Performed At: [facility] [address] [place], [place] [id number] Jennette Shorter MD Ey:[id number]    Source of Sample BRONCHIAL ALVEOLAR LAVAGE  Final    Comment: Performed at [facility], [address], [place], [place] [address]  Acid Fast Smear (AFB)     Status: None   Collection Time: 11/14/21  7:30 AM   Specimen: Bronchial Alveolar Lavage; Respiratory  Result Value Ref Range Status   AFB Specimen Processing Concentration  Final   Acid  Fast Smear Negative  Final    Comment: (NOTE) Performed At: [facility] [address] [place], [place] [id number] Jennette Shorter MD Ey:[id number]    Source (AFB) BRONCHIAL  ALVEOLAR LAVAGE  Final    Comment: Performed at [facility], [address], [place], [place] [address]  Fungus Culture Result     Status: None   Collection Time: 11/14/21  7:30 AM  Result Value Ref Range Status   Result 1 Comment  Final    Comment: (NOTE) KOH/Calcofluor preparation:  no fungus observed. Performed At: [facility] [address] [place], [place] [id number] Jennette Shorter MD Ey:[id number]   Fungus Culture Result     Status: None   Collection Time: 11/14/21  7:30 AM  Result Value Ref Range Status   Result 1 Comment  Final    Comment: (NOTE) KOH/Calcofluor preparation:  no fungus observed. Performed At: [facility] [address] [place], [place] [id number] Jennette Shorter MD Ey:[id number]   Fungus Culture Result     Status: None   Collection Time: 11/14/21  7:30 AM  Result Value Ref Range Status   Result 1 Comment  Final    Comment: (NOTE) KOH/Calcofluor preparation:  no fungus observed. Performed At: [facility] [address] [place], [place] [id number] Jennette Shorter MD Ey:[id number]   Fungal organism reflex     Status: None   Collection Time: 11/14/21  7:30 AM  Result Value Ref Range Status   Fungal result 1 Comment  Final    Comment: (NOTE) No yeast or mold isolated after 4 weeks. Performed At: [facility] [address] [place], [place] [id number] Jennette Shorter MD Ey:[id number]   Fungal organism reflex     Status: None   Collection Time: 11/14/21  7:30 AM  Result Value Ref Range Status   Fungal result 1 Comment  Final    Comment: (NOTE) No yeast or mold isolated after 4 weeks. Performed At: [facility] [address] [place], [place] [id number] Jennette Shorter MD Ey:[id number]   Fungal organism reflex      Status: None   Collection Time: 11/14/21  7:30 AM  Result Value Ref Range Status   Fungal result 1 Comment  Final    Comment: (NOTE) No yeast or mold isolated after 4 weeks. Performed At: [facility] [address] [place], [place] [id number] Jennette Shorter MD Ey:[id number]     Labs: CBC: Recent Labs  Lab 08/08/24 1316 08/10/24 0552  WBC 12.1* 8.5  HGB 12.5* 10.7*  HCT 39.3 32.8*  MCV 96.3 94.8  PLT 197 186   Basic Metabolic Panel: Recent Labs  Lab 08/08/24 1316 08/09/24 0527 08/10/24 0552 08/11/24 0420 08/12/24 0520  NA 140 144 140 139 140  K 3.8 3.6 3.1* 3.3* 3.6  CL 101 106 102 101 101  CO2 25 28 30 31  32  GLUCOSE 102* 91 97 110* 96  BUN 22 21 23 22  25*  CREATININE 1.00 0.99 0.98 1.00 1.19  CALCIUM 9.2 8.8* 8.4* 8.6* 8.8*  MG  --   --  2.1  --   --    Liver Function Tests: Recent Labs  Lab 08/08/24 1316  AST 93*  ALT 60*  ALKPHOS 100  BILITOT 1.0  PROT 6.8  ALBUMIN  3.8   CBG: No results for input(s): GLUCAP in the last 168 hours.  Discharge time spent: greater than 30 minutes.  Signed: Owen DELENA Lore, MD [facility] 08/14/2024

## 2024-08-14 NOTE — Progress Notes (Signed)
 Mobility Specialist - Progress Note   08/14/24 0946  Mobility  Activity Ambulated with assistance  Level of Assistance Minimal assist, patient does 75% or more  Assistive Device Front wheel walker  Distance Ambulated (ft) 90 ft  Range of Motion/Exercises Active  Activity Response Tolerated well  Mobility visit 1 Mobility  Mobility Specialist Start Time (ACUTE ONLY) 0920  Mobility Specialist Stop Time (ACUTE ONLY) 0946  Mobility Specialist Time Calculation (min) (ACUTE ONLY) 26 min   Pt was found on recliner chair and agreeable to mobilize. Ambulated 48' x2 with seated rest break in between. NT assisted with chair follow. At EOS returned to recliner chair with all needs met. Call bell in reach and NT in room.   Erminio Leos,  Mobility Specialist Can be reached via Secure Chat

## 2024-08-14 NOTE — Progress Notes (Signed)
 PHARMACY - ANTICOAGULATION CONSULT NOTE  Pharmacy Consult for warfarin Indication: hx atrial fibrillation  Allergies[1]  Patient Measurements: Height: 5' 6 (167.6 cm) Weight: 69.3 kg (152 lb 12.5 oz) IBW/kg (Calculated) : 63.8  Vital Signs: Temp: 97.8 F (36.6 C) (12/18 0404) Temp Source: Oral (12/18 0404) BP: 134/81 (12/18 0404) Pulse Rate: 60 (12/18 0404)  Labs: Recent Labs    08/12/24 0520 08/13/24 0532 08/14/24 0529  LABPROT 23.8* 24.2* 26.8*  INR 2.0* 2.1* 2.3*  CREATININE 1.19  --   --     Estimated Creatinine Clearance: 40.2 mL/min (by C-G formula based on SCr of 1.19 mg/dL).   Medical History: Past Medical History:  Diagnosis Date   Aortic stenosis    Atrial fibrillation (HCC)    Baker cyst    Left knee   DJD (degenerative joint disease)    knees   Dysrhythmia    Eczema    Erectile dysfunction    Gout    History of hiatal hernia    History of radiation therapy    Right lung- 04/21/21-05/03/21 Dr. Lynwood Nasuti   History of repair of thoracic aortic aneurysm 2006   HTN (hypertension)    Mild mitral regurgitation by prior echocardiogram    Moderate tricuspid regurgitation by prior echocardiogram    MVP (mitral valve prolapse)    Shortness of breath dyspnea    with activity    Medications:  - PTA warfarin regimen: 2.5 mg daily except for 3.75 mg on Fri with INR goal 2-2.5 (per [facility] clinic note on 12/2)  Assessment: Patient is an 88 y.o M with hx afib on warfarin PTA who presented to the ED on 08/08/24 for evaluation s/p falls and c/o SOB. Warfarin resumed on admission.  Today, 08/14/2024: - INR remains therapeutic at 2.3 - CBC on 12/14: hgb down 10.7, plts 186K - no bleeding documented  - no significant drug-drug intxns noted  Goal of Therapy:  INR 2-2.5 Monitor platelets by anticoagulation protocol: Yes   Plan:  - Warfarin 2.5 mg PO x1 today - daily INR - monitor for s/sx bleeding    [name], PharmD Clinical Pharmacist   12/18/202511:08 AM    [1] No Known Allergies

## 2024-08-14 NOTE — Telephone Encounter (Signed)
 Pt returned call and reports he has been d/c to rehab at [facility]. He will f/u with coumadin  clinic when he is back home.

## 2024-08-15 ENCOUNTER — Encounter: Payer: Self-pay | Admitting: General Practice

## 2024-08-15 ENCOUNTER — Ambulatory Visit: Attending: General Practice | Admitting: General Practice

## 2024-08-15 VITALS — BP 130/52 | HR 76 | Ht 65.0 in | Wt 151.0 lb

## 2024-08-15 DIAGNOSIS — Z952 Presence of prosthetic heart valve: Secondary | ICD-10-CM | POA: Diagnosis not present

## 2024-08-15 DIAGNOSIS — R0609 Other forms of dyspnea: Secondary | ICD-10-CM

## 2024-08-15 DIAGNOSIS — I4821 Permanent atrial fibrillation: Secondary | ICD-10-CM

## 2024-08-15 DIAGNOSIS — I10 Essential (primary) hypertension: Secondary | ICD-10-CM

## 2024-08-15 DIAGNOSIS — I5033 Acute on chronic diastolic (congestive) heart failure: Secondary | ICD-10-CM

## 2024-08-15 DIAGNOSIS — R55 Syncope and collapse: Secondary | ICD-10-CM

## 2024-08-15 NOTE — Patient Instructions (Addendum)
 Medication Instructions:  Your physician recommends that you continue on your current medications as directed. Please refer to the Current Medication list given to you today.  *If you need a refill on your cardiac medications before your next appointment, please call your pharmacy*  Lab Work: BMET Today If you have labs (blood work) drawn today and your tests are completely normal, you will receive your results only by: MyChart Message (if you have MyChart) OR A paper copy in the mail If you have any lab test that is abnormal or we need to change your treatment, we will call you to review the results.  Testing/Procedures: NONE ORDERED  Follow-Up: At [facility], you and your health needs are our priority.  As part of our continuing mission to provide you with exceptional heart care, our providers are all part of one team.  This team includes your primary Cardiologist (physician) and Advanced Practice Providers or APPs (Physician Assistants and Nurse Practitioners) who all work together to provide you with the care you need, when you need it.  Your next appointment:   3-4  month(s)  Provider:   Redell Shallow, MD or Josefa Beauvais, NP    We recommend signing up for the patient portal called MyChart.  Sign up information is provided on this After Visit Summary.  MyChart is used to connect with patients for Virtual Visits (Telemedicine).  Patients are able to view lab/test results, encounter notes, upcoming appointments, etc.  Non-urgent messages can be sent to your provider as well.   To learn more about what you can do with MyChart, go to [email or web address].   Other Instructions Increase physical activity as tolerated Please check your weight daily. Please contact the office if you gain more than 3lbs in a day or 5lbs in a week.  Limit your salt intake to 1500-2000mg  per day or 500mg  of Sodium per meal.

## 2024-08-16 LAB — BASIC METABOLIC PANEL WITH GFR
BUN/Creatinine Ratio: 22 (ref 10–24)
BUN: 26 mg/dL (ref 8–27)
CO2: 30 mmol/L — ABNORMAL HIGH (ref 20–29)
Calcium: 8.9 mg/dL (ref 8.6–10.2)
Chloride: 96 mmol/L (ref 96–106)
Creatinine, Ser: 1.18 mg/dL (ref 0.76–1.27)
Glucose: 97 mg/dL (ref 70–99)
Potassium: 3.8 mmol/L (ref 3.5–5.2)
Sodium: 140 mmol/L (ref 134–144)
eGFR: 60 mL/min/1.73

## 2024-08-17 ENCOUNTER — Ambulatory Visit: Payer: Self-pay | Admitting: General Practice

## 2024-08-18 NOTE — Progress Notes (Signed)
 Mailed letter

## 2024-09-01 ENCOUNTER — Telehealth: Payer: Self-pay

## 2024-09-01 NOTE — Telephone Encounter (Signed)
 Received VM from Arland, RN at [facility], [phone]) on 1/2 reporting pt will be d/c. She reports a pending INR lab result and that pt has been taking 2.5 mg daily, except 3.75 mg on one day. She reported on VM that she would f/u with coumadin  clinic as soon as INR lab result was received.  No f/u concerning INR lab result was received. Unable to see result in chart.  Left HIPAA compliant VM for Arland to return call.

## 2024-09-01 NOTE — Transitions of Care (Post Inpatient/ED Visit) (Signed)
 "  09/01/2024  Name: [name] MRN: [id number] DOB: 1936/03/01  Today's TOC FU Call Status: Today's TOC FU Call Status:: Successful TOC FU Call Completed TOC FU Call Complete Date: 09/01/24  Patient's Name and Date of Birth confirmed. DOB, Name  Transition Care Management Follow-up Telephone Call Date of Discharge: 08/29/24 Discharge Facility: Other [facility]) Name of Other (Non-[facility]) Discharge Facility: [facility] Type of Discharge: Inpatient Admission Primary Inpatient Discharge Diagnosis:: CHF Exacerbation How have you been since you were released from the hospital?: Better Any questions or concerns?: No  Items Reviewed: Did you receive and understand the discharge instructions provided?: Yes Medications obtained,verified, and reconciled?: Yes (Medications Reviewed) Any new allergies since your discharge?: No Dietary orders reviewed?: NA Do you have support at home?: Yes People in Home [RPT]: child(ren), adult  Medications Reviewed Today: Medications Reviewed Today     Reviewed by Lavelle Charmaine NOVAK, LPN (Licensed Practical Nurse) on 09/01/24 at 1048  Med List Status: <None>   Medication Order Taking? Sig Documenting Provider Last Dose Status Informant  albuterol  (PROVENTIL ) (2.5 MG/3ML) 0.083% nebulizer solution [id number]  Inhale 3 mLs into the lungs every 4 (four) hours as needed for wheezing or shortness of breath. Regalado, Belkys A, MD  Active   allopurinol  (ZYLOPRIM ) 300 MG tablet [id number] No Take 1 tablet by mouth once daily Theophilus Andrews, Tully GRADE, MD 08/08/2024 Active Self  Cholecalciferol (VITAMIN D3) 125 MCG (5000 UT) CAPS [id number] No Take 5,000 Units by mouth daily after breakfast. [provider] 08/08/2024 Morning Active Multiple Informants  cyanocobalamin  (VITAMIN B12) 500 MCG tablet [id number] No Take 1 tablet (500 mcg total) by mouth daily.  Patient not taking: Reported on 08/08/2024   Regalado, Belkys A, MD Not Taking Active  Self  feeding supplement (ENSURE PLUS HIGH PROTEIN) LIQD [id number] No Take 237 mLs by mouth 2 (two) times daily between meals.  Patient taking differently: Take 237 mLs by mouth 2 (two) times daily as needed (for meal supplementation).   Pokhrel, Laxman, MD Unknown Active Self  furosemide  (LASIX ) 40 MG tablet [id number]  Take 1 tablet (40 mg total) by mouth daily. Regalado, Belkys A, MD  Active   guaiFENesin  (MUCINEX ) 600 MG 12 hr tablet [id number]  Take 1 tablet (600 mg total) by mouth 2 (two) times daily. Regalado, Belkys A, MD  Active   pantoprazole  (PROTONIX ) 40 MG tablet [id number]  Take 1 tablet (40 mg total) by mouth daily. Regalado, Belkys A, MD  Active   potassium chloride  SA (KLOR-CON  M) 20 MEQ tablet [id number] No Take 1 tablet by mouth once daily Pietro Redell RAMAN, MD 08/08/2024 Morning Active Self  predniSONE  (DELTASONE ) 20 MG tablet [id number] No Take 1 tablet by mouth once daily with breakfast Theophilus Andrews, Tully GRADE, MD 08/08/2024 Morning Active Self  tamsulosin  (FLOMAX ) 0.4 MG CAPS capsule [id number] No Take 1 capsule (0.4 mg total) by mouth daily after supper.  Patient taking differently: Take 0.4 mg by mouth daily after breakfast.   Pokhrel, Laxman, MD 08/08/2024 Morning Active Self  traMADol  (ULTRAM ) 50 MG tablet [id number]  Take 1 tablet (50 mg total) by mouth every 12 (twelve) hours as needed. Regalado, Belkys A, MD  Active   traZODone  (DESYREL ) 50 MG tablet [id number]  Take 1-2 tablets (50-100 mg total) by mouth at bedtime. Regalado, Belkys A, MD  Active   warfarin (COUMADIN ) 2.5 MG tablet [id number] No TAKE 1 TABLET BY MOUTH DAILY EXCEPT TAKE 1 1/2 TABLET ON FRIDAY OR AS DIRECTED BY ANTICOAGULATION  CLINIC  Patient taking differently: Take 2.5-3.75 mg by mouth See admin instructions. Take 2.5 mg by mouth in the morning on Sun/Mon/Tues/Wed/Thurs/Sat and 3.75 mg on Fridays   Theophilus Andrews, Tully GRADE, MD 08/08/2024  9:00 AM Active Self            Home Care and  Equipment/Supplies: Were Home Health Services Ordered?: NA Any new equipment or medical supplies ordered?: NA  Functional Questionnaire: Do you need assistance with bathing/showering or dressing?: No Do you need assistance with meal preparation?: Yes Do you need assistance with eating?: No Do you have difficulty maintaining continence: No Do you need assistance with getting out of bed/getting out of a chair/moving?: No Do you have difficulty managing or taking your medications?: No  Follow up appointments reviewed: PCP Follow-up appointment confirmed?: Yes Date of PCP follow-up appointment?: 09/11/24 Follow-up Provider: Dr. Andrews Specialist [facility] Follow-up appointment confirmed?: Yes Date of Specialist follow-up appointment?: 09/22/24 Follow-Up Specialty Provider:: Pulmonology Do you need transportation to your follow-up appointment?: No Do you understand care options if your condition(s) worsen?: Yes-patient verbalized understanding    SIGNATURE Charmaine Bloodgood, LPN [facility] Health Advisor [facility] You Are. We Are. [facility] Direct Dial [phone]  "

## 2024-09-01 NOTE — Telephone Encounter (Signed)
 Contacted pt who reports he is home and doing better every day. Advised of INR on 1/2, which was 1.7. Advised to take an extra 1/2 tablet today and then resume prior dosing. Pt denies any changes in medication. But he does report he is now living with his daughter per her request. He is happy with this decision. He requested a coumadin  clinic apt for next week due to recovery and transportation. Scheduled for 1/16 per pt request and advised if any changes to contact the coumadin  clinic. Pt verbalized understanding.   Reminded pt of PCP hospital f/u video visit on 1/15.

## 2024-09-01 NOTE — Telephone Encounter (Signed)
 Arland, RN, returned call with INR results.  12/19: 2.21 12/26: 2.07 1/2: 1.74  She reports she advised the pt's daughter to f/u with the coumadin  clinic concerning the last INR of 1.74  She reports she thinks the pt's INR range was 2.5-3 but is not sure. She also reported they did not have to adjust warfarin dosing while pt was in their care.  Pt remained on 2.5 mg daily except take 3.75 mg on Friday.   Pt's INR range prior to hospitalization was 2.0-2.5  Notes by pharmacist in hospital reported range is 2.0-2.5  Pt was in range in hospital.

## 2024-09-01 NOTE — Telephone Encounter (Signed)
 LVM

## 2024-09-04 NOTE — Telephone Encounter (Signed)
 LVM for [facility] company to inquire if an INR can be obtained at the pt's home visits.

## 2024-09-05 NOTE — Telephone Encounter (Signed)
 Left another VM with HH company.

## 2024-09-05 NOTE — Telephone Encounter (Addendum)
 Vertell, RN at [facility] [phone]), returned call. She reports PT and OT are not able to perform an INR. She requested reason why pt can come to office. Advised ambulation difficulties. She reports she will send PCP a request for order for INR. They do have a POCT INR. She will add [facility] nurse visits for this pt. She will send the order to PCP for signature. She will have a nurse there next week, on Monday or Tuesday (1/12 or 1/13). Advised to contact coumadin  clinic directly with result. Provided direct number.

## 2024-09-08 NOTE — Telephone Encounter (Signed)
 Randine Bees, RN with [facility] [phone]), visited pt today but she was unable get the POCT INR machine to work and her tubes for INR lab draw were expired.  Advised would need INR check tomorrow if possible. Randine reports she can return to pt's home tomorrow and try testing again.    Faxed [employer] home INR machine order.

## 2024-09-09 ENCOUNTER — Ambulatory Visit

## 2024-09-09 ENCOUNTER — Telehealth: Payer: Self-pay

## 2024-09-09 DIAGNOSIS — Z7901 Long term (current) use of anticoagulants: Secondary | ICD-10-CM | POA: Diagnosis not present

## 2024-09-09 LAB — POCT INR: INR: 1.6 — AB (ref 2.0–3.0)

## 2024-09-09 NOTE — Patient Instructions (Addendum)
 Pre visit review using our clinic review tool, if applicable. No additional management support is needed unless otherwise documented below in the visit note.  Increase dose today to take 1 1/2 tablets and then change weekly take 1 tablet daily  except take 1 1/2 tablet on Monday and Friday.  Recheck in 1 week.

## 2024-09-09 NOTE — Telephone Encounter (Signed)
 Copied from CRM [id number]. Topic: General - Other >> Sep 09, 2024  3:33 PM Macario HERO wrote:  Reason for CRM: Patient called requesting speaking to [facility], called CAL and was advised currently not available.

## 2024-09-09 NOTE — Telephone Encounter (Signed)
 Pt has been contacted.

## 2024-09-09 NOTE — Progress Notes (Signed)
 Indication: Afib HH RN from [facility] : [facility] [phone]) called in POCT INR of 1.6 today. Pt was in hospital on 12/12 for SOB, pleural effusion. He was d/c to rehab and is now living with his daughter.  Increase dose today to take 1 1/2 tablets and then change weekly take 1 tablet daily  except take 1 1/2 tablet on Monday and Friday.  Recheck in 1 week.  Advised pt of dosing and recheck. Advised home INR machine company will be calling to talk about testing INR at home. Pt will let his daughter know. Contacted Randine and advised of dosing change. She read back instructions and reported she will contact pt or his daughter with dosing instructions. She will recheck INR on 1/19.

## 2024-09-09 NOTE — Telephone Encounter (Unsigned)
 Copied from CRM [id number]. Topic: General - Call Back - No Documentation >> Sep 09, 2024  3:44 PM Alfonso ORN wrote: Reason for CRM: pt called to return call. Cal said no one avail to speak at this time . Please f/u with pt.

## 2024-09-10 ENCOUNTER — Telehealth: Payer: Self-pay | Admitting: Cardiology

## 2024-09-10 NOTE — Telephone Encounter (Signed)
 Spoke with Mitzie with home health PT. Morning appointment BP 164/80. BP at appointment last week was 134/76. Pt does not normally have morning appointments. Has nursing visit tomorrow.  Son in law was told by to take BP again this afternoon and again tomorrow morning before nurse visit. Pt was taken off of amlodipine  last week due to stable BP. Mitzie stated Pt has great support system at home. They are not sure if this is a trend or a one off. Advised I would send over for review and FYI. Nursing will call if elevated BP tomorrow. If any recommendations we may call Mitzie or pt.

## 2024-09-10 NOTE — Telephone Encounter (Signed)
 Pt c/o BP issue: STAT if pt c/o blurred vision, one-sided weakness or slurred speech  1. What are your last 5 BP readings? 164/80  2. Are you having any other symptoms (ex. Dizziness, headache, blurred vision, passed out)? No  3. What is your BP issue? Pt was taken off of amlodipine  a couple weeks ago and now has elevated BP. Please advise

## 2024-09-11 ENCOUNTER — Encounter: Payer: Self-pay | Admitting: Internal Medicine

## 2024-09-11 ENCOUNTER — Encounter: Admitting: Internal Medicine

## 2024-09-11 NOTE — Progress Notes (Signed)
 Per patient no change in vitals since last visit, unable to obtain new vitals due to telehealth visit.

## 2024-09-12 ENCOUNTER — Ambulatory Visit

## 2024-09-15 ENCOUNTER — Ambulatory Visit (INDEPENDENT_AMBULATORY_CARE_PROVIDER_SITE_OTHER)

## 2024-09-15 DIAGNOSIS — Z7901 Long term (current) use of anticoagulants: Secondary | ICD-10-CM | POA: Diagnosis not present

## 2024-09-15 LAB — POCT INR: INR: 2.8 (ref 2.0–3.0)

## 2024-09-15 NOTE — Progress Notes (Signed)
 Indication: Afib HH RN from [facility] [phone]) called in POCT INR of 2.8 today. Pt was in hospital on 12/12 for SOB, pleural effusion. He was d/c to rehab and is now living with his daughter.  Pt already took warfarin today. Hold warfarin tomorrow and then continue 1 tablet daily  except take 1 1/2 tablet on Monday and Friday.  Recheck in 1 week.  Contacted Tracy and pt and advised of dosing change. She read back instructions and reported she will contact pt or his daughter with dosing instructions. She will recheck INR on 1/26. Pt's daughter is requesting a home INR machine. Advised pt the company will be calling to talk about testing INR at home. Pt will let his daughter know. Contacted Randine and advised of dosing change. She read back instructions and reported she will contact pt or his daughter with dosing instructions. She will recheck INR on 1/19.

## 2024-09-15 NOTE — Progress Notes (Signed)
 This encounter was created in error - please disregard.

## 2024-09-15 NOTE — Patient Instructions (Addendum)
 Pre visit review using our clinic review tool, if applicable. No additional management support is needed unless otherwise documented below in the visit note.  Hold warfarin tomorrow and then continue 1 tablet daily  except take 1 1/2 tablet on Monday and Friday.  Recheck in 1 week.

## 2024-09-22 ENCOUNTER — Ambulatory Visit: Admitting: Pulmonary Disease
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
## 2024-09-24 ENCOUNTER — Telehealth: Payer: Self-pay
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
## 2024-09-29 ENCOUNTER — Ambulatory Visit: Payer: Self-pay

## 2024-09-29 DIAGNOSIS — Z7901 Long term (current) use of anticoagulants: Secondary | ICD-10-CM

## 2024-09-29 LAB — POCT INR: INR: 2.3 (ref 2.0–3.0)

## 2024-09-30 NOTE — Telephone Encounter (Signed)
 Pt reported a home INR result today reporting he has been set up to test INR at home. INR today was 2.1.  Also obtained INR yesterday from [facility] nurse who reported it was her last visit.   Contacted pt and advised to test in one week, on Monday of next week. Advised if any problems testing to contact the coumadin  clinic. Pt verbalized understanding.

## 2024-10-06 ENCOUNTER — Ambulatory Visit: Admitting: Pulmonary Disease

## 2024-12-16 ENCOUNTER — Ambulatory Visit: Admitting: General Practice
